# Patient Record
Sex: Female | Born: 1950 | Race: White | Hispanic: No | Marital: Married | Smoking: Never smoker
Health system: Southern US, Community
[De-identification: ages and names within clinical notes are randomized; demographics above are authoritative.]

## PROBLEM LIST (undated history)

## (undated) DIAGNOSIS — C50919 Malignant neoplasm of unspecified site of unspecified female breast: Secondary | ICD-10-CM

## (undated) DIAGNOSIS — E119 Type 2 diabetes mellitus without complications: Secondary | ICD-10-CM

## (undated) DIAGNOSIS — J45909 Unspecified asthma, uncomplicated: Secondary | ICD-10-CM

## (undated) DIAGNOSIS — R519 Headache, unspecified: Secondary | ICD-10-CM

## (undated) DIAGNOSIS — M8580 Other specified disorders of bone density and structure, unspecified site: Secondary | ICD-10-CM

## (undated) DIAGNOSIS — K219 Gastro-esophageal reflux disease without esophagitis: Secondary | ICD-10-CM

## (undated) DIAGNOSIS — D649 Anemia, unspecified: Secondary | ICD-10-CM

## (undated) DIAGNOSIS — I10 Essential (primary) hypertension: Secondary | ICD-10-CM

## (undated) DIAGNOSIS — Z8719 Personal history of other diseases of the digestive system: Secondary | ICD-10-CM

## (undated) DIAGNOSIS — M810 Age-related osteoporosis without current pathological fracture: Secondary | ICD-10-CM

## (undated) DIAGNOSIS — G43909 Migraine, unspecified, not intractable, without status migrainosus: Secondary | ICD-10-CM

## (undated) DIAGNOSIS — G4730 Sleep apnea, unspecified: Secondary | ICD-10-CM

## (undated) DIAGNOSIS — M1990 Unspecified osteoarthritis, unspecified site: Secondary | ICD-10-CM

## (undated) DIAGNOSIS — T7840XA Allergy, unspecified, initial encounter: Secondary | ICD-10-CM

## (undated) DIAGNOSIS — M797 Fibromyalgia: Secondary | ICD-10-CM

## (undated) DIAGNOSIS — R0600 Dyspnea, unspecified: Secondary | ICD-10-CM

## (undated) DIAGNOSIS — I499 Cardiac arrhythmia, unspecified: Secondary | ICD-10-CM

## (undated) DIAGNOSIS — L409 Psoriasis, unspecified: Secondary | ICD-10-CM

## (undated) DIAGNOSIS — Z5189 Encounter for other specified aftercare: Secondary | ICD-10-CM

## (undated) DIAGNOSIS — E785 Hyperlipidemia, unspecified: Secondary | ICD-10-CM

## (undated) DIAGNOSIS — Z8489 Family history of other specified conditions: Secondary | ICD-10-CM

## (undated) DIAGNOSIS — R0902 Hypoxemia: Secondary | ICD-10-CM

## (undated) HISTORY — PX: ABDOMINAL HYSTERECTOMY: SHX81

## (undated) HISTORY — DX: Encounter for other specified aftercare: Z51.89

## (undated) HISTORY — DX: Other specified disorders of bone density and structure, unspecified site: M85.80

## (undated) HISTORY — DX: Fibromyalgia: M79.7

## (undated) HISTORY — PX: TUBAL LIGATION: SHX77

## (undated) HISTORY — PX: OTHER SURGICAL HISTORY: SHX169

## (undated) HISTORY — DX: Hyperlipidemia, unspecified: E78.5

## (undated) HISTORY — DX: Allergy, unspecified, initial encounter: T78.40XA

## (undated) HISTORY — PX: CHOLECYSTECTOMY: SHX55

## (undated) HISTORY — PX: BREAST SURGERY: SHX581

## (undated) HISTORY — DX: Type 2 diabetes mellitus without complications: E11.9

## (undated) HISTORY — DX: Hypoxemia: R09.02

## (undated) HISTORY — PX: APPENDECTOMY: SHX54

## (undated) HISTORY — DX: Psoriasis, unspecified: L40.9

## (undated) HISTORY — PX: FRACTURE SURGERY: SHX138

## (undated) HISTORY — PX: HYSTERECTOMY ABDOMINAL WITH SALPINGO-OOPHORECTOMY: SHX6792

## (undated) HISTORY — DX: Sleep apnea, unspecified: G47.30

## (undated) HISTORY — PX: SHOULDER SURGERY: SHX246

## (undated) HISTORY — DX: Age-related osteoporosis without current pathological fracture: M81.0

## (undated) HISTORY — DX: Gastro-esophageal reflux disease without esophagitis: K21.9

## (undated) HISTORY — PX: LYSIS OF ADHESION: SHX5961

---

## 2000-03-20 ENCOUNTER — Other Ambulatory Visit: Admission: RE | Admit: 2000-03-20 | Discharge: 2000-03-20 | Payer: Self-pay | Admitting: Family Medicine

## 2000-08-26 ENCOUNTER — Encounter: Admission: RE | Admit: 2000-08-26 | Discharge: 2000-08-26 | Payer: Self-pay | Admitting: Rheumatology

## 2000-08-26 ENCOUNTER — Encounter: Payer: Self-pay | Admitting: Rheumatology

## 2001-09-03 ENCOUNTER — Ambulatory Visit (HOSPITAL_COMMUNITY): Admission: RE | Admit: 2001-09-03 | Discharge: 2001-09-03 | Payer: Self-pay | Admitting: Gastroenterology

## 2001-09-21 ENCOUNTER — Other Ambulatory Visit: Admission: RE | Admit: 2001-09-21 | Discharge: 2001-09-21 | Payer: Self-pay | Admitting: Obstetrics and Gynecology

## 2001-11-05 ENCOUNTER — Encounter: Admission: RE | Admit: 2001-11-05 | Discharge: 2001-11-05 | Payer: Self-pay | Admitting: Rheumatology

## 2001-11-05 ENCOUNTER — Encounter: Payer: Self-pay | Admitting: Rheumatology

## 2003-11-28 ENCOUNTER — Encounter: Admission: RE | Admit: 2003-11-28 | Discharge: 2003-11-28 | Payer: Self-pay | Admitting: Rheumatology

## 2005-08-14 ENCOUNTER — Encounter: Admission: RE | Admit: 2005-08-14 | Discharge: 2005-08-14 | Payer: Self-pay | Admitting: Gastroenterology

## 2007-05-12 ENCOUNTER — Encounter (INDEPENDENT_AMBULATORY_CARE_PROVIDER_SITE_OTHER): Payer: Self-pay | Admitting: Gastroenterology

## 2007-05-12 ENCOUNTER — Ambulatory Visit (HOSPITAL_COMMUNITY): Admission: RE | Admit: 2007-05-12 | Discharge: 2007-05-12 | Payer: Self-pay | Admitting: Gastroenterology

## 2008-03-25 ENCOUNTER — Encounter: Admission: RE | Admit: 2008-03-25 | Discharge: 2008-03-25 | Payer: Self-pay | Admitting: Rheumatology

## 2008-03-27 ENCOUNTER — Encounter: Admission: RE | Admit: 2008-03-27 | Discharge: 2008-03-27 | Payer: Self-pay | Admitting: Rheumatology

## 2008-07-18 ENCOUNTER — Encounter: Admission: RE | Admit: 2008-07-18 | Discharge: 2008-07-18 | Payer: Self-pay | Admitting: Rheumatology

## 2010-01-29 ENCOUNTER — Encounter: Admission: RE | Admit: 2010-01-29 | Discharge: 2010-01-29 | Payer: Self-pay | Admitting: Family Medicine

## 2010-12-07 ENCOUNTER — Encounter: Payer: Self-pay | Admitting: Rheumatology

## 2011-03-21 ENCOUNTER — Other Ambulatory Visit: Payer: Self-pay | Admitting: Unknown Physician Specialty

## 2011-03-21 ENCOUNTER — Ambulatory Visit
Admission: RE | Admit: 2011-03-21 | Discharge: 2011-03-21 | Disposition: A | Discharge status: Home / Self Care | Payer: PRIVATE HEALTH INSURANCE | Source: Ambulatory Visit | Attending: Unknown Physician Specialty | Admitting: Unknown Physician Specialty

## 2011-03-21 DIAGNOSIS — R52 Pain, unspecified: Secondary | ICD-10-CM

## 2011-04-01 NOTE — Op Note (Signed)
Kayla Bennett, Kayla Bennett             ACCOUNT NO.:  [id number]   MEDICAL RECORD NO.:  [id number]          PATIENT TYPE:  AMB   LOCATION:  ENDO                         FACILITY:  [facility]   PHYSICIAN:  Anselmo Rod, M.D.  DATE OF BIRTH:  13-Sep-1951   DATE OF PROCEDURE:  05/12/2007  DATE OF DISCHARGE:                               OPERATIVE REPORT   PROCEDURE PERFORMED:  Colonoscopy with snare polypectomy x 2 and cold  biopsies x1.   ENDOSCOPIST:  Anselmo Rod, M.D.   INSTRUMENT USED:  Pentax video colonoscope.   INDICATIONS FOR PROCEDURE:  A 56-year white female with a family history  of colon cancer in her father and a paternal cousin undergoing screening  colonoscopy to rule out colonic polyps, masses, etc.   PREPROCEDURE PREPARATION:  Informed consent was procured from the  patient. The patient fasted for 8 hours prior to the procedure after  being prepped with 32 Osmoprep pills the night of the procedure. Risks  and benefits of the procedure including a 10% miss rate of cancer and  polyp were discussed with the patient as well.   PREPROCEDURE PHYSICAL:  VITAL SIGNS:  The patient had stable vital  signs.  NECK:  Supple.  CHEST:  Clear to auscultation.  CARDIAC:  S1 and S2 regular.  ABDOMEN:  Soft with normal bowel sounds.   DESCRIPTION OF PROCEDURE:  The patient was placed in left lateral  decubitus position and sedated with a 125 mg of Fentanyl and 12 mg of  Versed given intravenously in slow incremental doses.  Once the patient  was adequately sedated and maintained on low-flow oxygen and continuous  cardiac monitoring, the Pentax video colonoscope was advanced from the  rectum to the cecum with difficulty.  The patient has a history of  fibromyalgia and therefore seemed to experience a lot of abdominal  discomfort with insufflation of air into the colon. Multiple washes were  done.  The patient had a poor prep.  There was a significant amount of  residual stool in the  colon. A small polyp was ablated with the tip of  the hot snare at 100 cm, another flat polyp was removed by hot snare  from 110 cm. A small sessile polyp was biopsied from 100 cm as well.  Retroflexion in the rectum revealed no abnormalities except for small  internal hemorrhoids.  The patient tolerated the procedure well without  complications. Scope had to be withdrawn on several occasions and  reinserted to complete the procedure. The patient has significant  abdominal discomfort.  As the scope was withdrawn, a significant amount  of air was suctioned out. The patient tolerated the procedure well  without immediate complications.   IMPRESSION:  1. A flat polyp removed by hot snare at 110 cm, one small polyp      ablated at 100 cm with the tip of the hot snare.  2. Small polyp biopsied from 100 cm.   RECOMMENDATIONS:  1. Await pathology results.  2. Avoid all nonsteroidals including aspirin for the next four weeks.  3. Outpatient follow-up in the next two weeks.  Further recommendation,      the patient should be prepped with a gallon of NuLYTELY the next      time and be done under propofol for better results.  This will be      discussed with her on a follow-up visit.      Anselmo Rod, M.D.  Electronically Signed     JNM/MEDQ  D:  05/12/2007  T:  05/12/2007  Job:  [id number]   cc:   Talmadge Coventry, M.D.  Fax: [phone]

## 2011-04-03 ENCOUNTER — Ambulatory Visit: Payer: PRIVATE HEALTH INSURANCE | Attending: Orthopedic Surgery | Admitting: Physical Therapy

## 2011-04-03 DIAGNOSIS — M25519 Pain in unspecified shoulder: Secondary | ICD-10-CM | POA: Insufficient documentation

## 2011-04-03 DIAGNOSIS — M25619 Stiffness of unspecified shoulder, not elsewhere classified: Secondary | ICD-10-CM | POA: Insufficient documentation

## 2011-04-03 DIAGNOSIS — IMO0001 Reserved for inherently not codable concepts without codable children: Secondary | ICD-10-CM | POA: Insufficient documentation

## 2011-04-04 NOTE — Procedures (Signed)
[place]  Patient:    Kayla Bennett, Kayla B. Visit Number: [id number] MRN: [id number]          Service Type: Attending:  Anselmo Rod, M.D. Dictated by:   Anselmo Rod, M.D. Proc. Date: 09/03/01   CC:         Talmadge Coventry, M.D.                           Procedure Report  DATE OF BIRTH:  October 17, 1951  REFERRING PHYSICIAN:  Talmadge Coventry, M.D.  PROCEDURE PERFORMED:  Colonoscopy.  ENDOSCOPIST:  Anselmo Rod, M.D.  INSTRUMENT USED:  Olympus video colonoscope (pediatric).  INDICATIONS FOR PROCEDURE:  Family history of colon cancer and history of change in bowel habits with some left lower quadrant pain in a 60 year old white female.  Rule out colonic polyps, masses, hemorrhoids, etc.  PREPROCEDURE PREPARATION:  Informed consent was procured from the patient. The patient was fasted for eight hours prior to the procedure.  PREPROCEDURE PHYSICAL:  The patient had stable vital signs.  Neck supple. Chest clear to auscultation.  S1, S2 regular.  Abdomen soft with normal abdominal bowel sounds.  DESCRIPTION OF PROCEDURE:  The patient was placed in the left lateral decubitus position and sedated with 50 mg of Demerol and 3 mg of Versed intravenously.  Once the patient was adequately sedated and maintained on low-flow oxygen and continuous cardiac monitoring, the Olympus video colonoscope was advanced from the rectum to the cecum without difficulty. Except for small internal hemorrhoids, no abnormality was seen, no masses polyps, erosions, ulcerations or diverticula were present.  IMPRESSION:  Normal colonoscopy except for small internal hemorrhoids.  RECOMMENDATIONS: 1. Repeat colorectal cancer screening has been recommended in the next    five years unless the patient were to develop any abnormal symptoms in    the interim. 2. A high fiber diet has been advised.Dictated by:   Anselmo Rod, M.D.  Attending:  Anselmo Rod, M.D. DD:  09/03/01 TD:  09/06/01 Job: [id number]

## 2011-04-07 ENCOUNTER — Ambulatory Visit: Payer: PRIVATE HEALTH INSURANCE | Admitting: Physical Therapy

## 2011-04-09 ENCOUNTER — Other Ambulatory Visit: Payer: Self-pay | Admitting: Dermatology

## 2011-04-10 ENCOUNTER — Ambulatory Visit: Payer: PRIVATE HEALTH INSURANCE | Admitting: Physical Therapy

## 2011-04-15 ENCOUNTER — Ambulatory Visit: Payer: PRIVATE HEALTH INSURANCE | Admitting: Physical Therapy

## 2011-04-17 ENCOUNTER — Ambulatory Visit: Payer: PRIVATE HEALTH INSURANCE | Admitting: Physical Therapy

## 2011-04-18 ENCOUNTER — Encounter: Payer: PRIVATE HEALTH INSURANCE | Admitting: Physical Therapy

## 2011-11-06 ENCOUNTER — Other Ambulatory Visit: Payer: Self-pay | Admitting: Unknown Physician Specialty

## 2011-11-06 DIAGNOSIS — Z780 Asymptomatic menopausal state: Secondary | ICD-10-CM

## 2012-10-13 ENCOUNTER — Other Ambulatory Visit: Payer: Self-pay | Admitting: Unknown Physician Specialty

## 2012-10-13 DIAGNOSIS — Z780 Asymptomatic menopausal state: Secondary | ICD-10-CM

## 2012-10-18 ENCOUNTER — Ambulatory Visit
Admission: RE | Admit: 2012-10-18 | Discharge: 2012-10-18 | Disposition: A | Discharge status: Home / Self Care | Payer: PRIVATE HEALTH INSURANCE | Source: Ambulatory Visit | Attending: Unknown Physician Specialty | Admitting: Unknown Physician Specialty

## 2012-10-18 DIAGNOSIS — Z780 Asymptomatic menopausal state: Secondary | ICD-10-CM

## 2012-10-19 ENCOUNTER — Other Ambulatory Visit: Payer: PRIVATE HEALTH INSURANCE

## 2013-01-07 ENCOUNTER — Other Ambulatory Visit: Payer: Self-pay | Admitting: Dermatology

## 2016-04-08 DIAGNOSIS — E119 Type 2 diabetes mellitus without complications: Secondary | ICD-10-CM | POA: Diagnosis not present

## 2016-04-08 DIAGNOSIS — I10 Essential (primary) hypertension: Secondary | ICD-10-CM | POA: Diagnosis not present

## 2016-04-08 DIAGNOSIS — M797 Fibromyalgia: Secondary | ICD-10-CM | POA: Diagnosis not present

## 2016-04-08 DIAGNOSIS — E7800 Pure hypercholesterolemia, unspecified: Secondary | ICD-10-CM | POA: Diagnosis not present

## 2016-05-02 DIAGNOSIS — H2513 Age-related nuclear cataract, bilateral: Secondary | ICD-10-CM | POA: Diagnosis not present

## 2016-05-02 DIAGNOSIS — E1139 Type 2 diabetes mellitus with other diabetic ophthalmic complication: Secondary | ICD-10-CM | POA: Diagnosis not present

## 2016-05-02 DIAGNOSIS — H43393 Other vitreous opacities, bilateral: Secondary | ICD-10-CM | POA: Diagnosis not present

## 2016-08-04 DIAGNOSIS — E119 Type 2 diabetes mellitus without complications: Secondary | ICD-10-CM | POA: Diagnosis not present

## 2016-08-04 DIAGNOSIS — M8580 Other specified disorders of bone density and structure, unspecified site: Secondary | ICD-10-CM | POA: Diagnosis not present

## 2016-08-04 DIAGNOSIS — M899 Disorder of bone, unspecified: Secondary | ICD-10-CM | POA: Diagnosis not present

## 2016-08-04 DIAGNOSIS — Z0000 Encounter for general adult medical examination without abnormal findings: Secondary | ICD-10-CM | POA: Diagnosis not present

## 2016-08-04 DIAGNOSIS — R61 Generalized hyperhidrosis: Secondary | ICD-10-CM | POA: Diagnosis not present

## 2016-08-04 DIAGNOSIS — R5383 Other fatigue: Secondary | ICD-10-CM | POA: Diagnosis not present

## 2016-08-04 DIAGNOSIS — E7800 Pure hypercholesterolemia, unspecified: Secondary | ICD-10-CM | POA: Diagnosis not present

## 2016-08-04 DIAGNOSIS — Z1231 Encounter for screening mammogram for malignant neoplasm of breast: Secondary | ICD-10-CM | POA: Diagnosis not present

## 2016-08-04 DIAGNOSIS — I10 Essential (primary) hypertension: Secondary | ICD-10-CM | POA: Diagnosis not present

## 2016-08-04 DIAGNOSIS — Z23 Encounter for immunization: Secondary | ICD-10-CM | POA: Diagnosis not present

## 2016-08-07 DIAGNOSIS — L814 Other melanin hyperpigmentation: Secondary | ICD-10-CM | POA: Diagnosis not present

## 2016-08-07 DIAGNOSIS — L570 Actinic keratosis: Secondary | ICD-10-CM | POA: Diagnosis not present

## 2016-08-07 DIAGNOSIS — D485 Neoplasm of uncertain behavior of skin: Secondary | ICD-10-CM | POA: Diagnosis not present

## 2016-08-07 DIAGNOSIS — L821 Other seborrheic keratosis: Secondary | ICD-10-CM | POA: Diagnosis not present

## 2016-08-07 DIAGNOSIS — D225 Melanocytic nevi of trunk: Secondary | ICD-10-CM | POA: Diagnosis not present

## 2016-08-07 DIAGNOSIS — L820 Inflamed seborrheic keratosis: Secondary | ICD-10-CM | POA: Diagnosis not present

## 2016-08-18 DIAGNOSIS — Z23 Encounter for immunization: Secondary | ICD-10-CM | POA: Diagnosis not present

## 2016-10-27 DIAGNOSIS — Z1231 Encounter for screening mammogram for malignant neoplasm of breast: Secondary | ICD-10-CM | POA: Diagnosis not present

## 2016-12-17 DIAGNOSIS — M797 Fibromyalgia: Secondary | ICD-10-CM | POA: Diagnosis not present

## 2016-12-17 DIAGNOSIS — E119 Type 2 diabetes mellitus without complications: Secondary | ICD-10-CM | POA: Diagnosis not present

## 2016-12-17 DIAGNOSIS — E7800 Pure hypercholesterolemia, unspecified: Secondary | ICD-10-CM | POA: Diagnosis not present

## 2016-12-17 DIAGNOSIS — I10 Essential (primary) hypertension: Secondary | ICD-10-CM | POA: Diagnosis not present

## 2016-12-19 ENCOUNTER — Other Ambulatory Visit: Payer: Self-pay | Admitting: Unknown Physician Specialty

## 2016-12-19 DIAGNOSIS — R5381 Other malaise: Secondary | ICD-10-CM

## 2016-12-24 ENCOUNTER — Other Ambulatory Visit: Payer: Self-pay | Admitting: Unknown Physician Specialty

## 2016-12-24 DIAGNOSIS — E2839 Other primary ovarian failure: Secondary | ICD-10-CM

## 2017-04-20 DIAGNOSIS — E119 Type 2 diabetes mellitus without complications: Secondary | ICD-10-CM | POA: Diagnosis not present

## 2017-04-20 DIAGNOSIS — G43909 Migraine, unspecified, not intractable, without status migrainosus: Secondary | ICD-10-CM | POA: Diagnosis not present

## 2017-04-20 DIAGNOSIS — I10 Essential (primary) hypertension: Secondary | ICD-10-CM | POA: Diagnosis not present

## 2017-04-20 DIAGNOSIS — E7800 Pure hypercholesterolemia, unspecified: Secondary | ICD-10-CM | POA: Diagnosis not present

## 2017-05-06 DIAGNOSIS — E119 Type 2 diabetes mellitus without complications: Secondary | ICD-10-CM | POA: Diagnosis not present

## 2017-05-06 DIAGNOSIS — Z7984 Long term (current) use of oral hypoglycemic drugs: Secondary | ICD-10-CM | POA: Diagnosis not present

## 2017-05-06 DIAGNOSIS — H527 Unspecified disorder of refraction: Secondary | ICD-10-CM | POA: Diagnosis not present

## 2017-08-05 DIAGNOSIS — I10 Essential (primary) hypertension: Secondary | ICD-10-CM | POA: Diagnosis not present

## 2017-08-05 DIAGNOSIS — R5383 Other fatigue: Secondary | ICD-10-CM | POA: Diagnosis not present

## 2017-08-05 DIAGNOSIS — M791 Myalgia: Secondary | ICD-10-CM | POA: Diagnosis not present

## 2017-08-05 DIAGNOSIS — M2550 Pain in unspecified joint: Secondary | ICD-10-CM | POA: Diagnosis not present

## 2017-08-05 DIAGNOSIS — M8580 Other specified disorders of bone density and structure, unspecified site: Secondary | ICD-10-CM | POA: Diagnosis not present

## 2017-08-05 DIAGNOSIS — Z1231 Encounter for screening mammogram for malignant neoplasm of breast: Secondary | ICD-10-CM | POA: Diagnosis not present

## 2017-08-05 DIAGNOSIS — E7800 Pure hypercholesterolemia, unspecified: Secondary | ICD-10-CM | POA: Diagnosis not present

## 2017-08-05 DIAGNOSIS — G43909 Migraine, unspecified, not intractable, without status migrainosus: Secondary | ICD-10-CM | POA: Diagnosis not present

## 2017-08-05 DIAGNOSIS — E119 Type 2 diabetes mellitus without complications: Secondary | ICD-10-CM | POA: Diagnosis not present

## 2017-08-05 DIAGNOSIS — M899 Disorder of bone, unspecified: Secondary | ICD-10-CM | POA: Diagnosis not present

## 2017-08-05 DIAGNOSIS — Z0000 Encounter for general adult medical examination without abnormal findings: Secondary | ICD-10-CM | POA: Diagnosis not present

## 2017-08-14 ENCOUNTER — Other Ambulatory Visit: Payer: Self-pay | Admitting: Unknown Physician Specialty

## 2017-08-14 DIAGNOSIS — E2839 Other primary ovarian failure: Secondary | ICD-10-CM

## 2017-08-31 DIAGNOSIS — Z23 Encounter for immunization: Secondary | ICD-10-CM | POA: Diagnosis not present

## 2017-10-07 DIAGNOSIS — L700 Acne vulgaris: Secondary | ICD-10-CM | POA: Diagnosis not present

## 2017-10-07 DIAGNOSIS — D225 Melanocytic nevi of trunk: Secondary | ICD-10-CM | POA: Diagnosis not present

## 2017-10-07 DIAGNOSIS — D2262 Melanocytic nevi of left upper limb, including shoulder: Secondary | ICD-10-CM | POA: Diagnosis not present

## 2017-10-07 DIAGNOSIS — L853 Xerosis cutis: Secondary | ICD-10-CM | POA: Diagnosis not present

## 2017-10-07 DIAGNOSIS — D1801 Hemangioma of skin and subcutaneous tissue: Secondary | ICD-10-CM | POA: Diagnosis not present

## 2017-10-07 DIAGNOSIS — L814 Other melanin hyperpigmentation: Secondary | ICD-10-CM | POA: Diagnosis not present

## 2017-10-07 DIAGNOSIS — L300 Nummular dermatitis: Secondary | ICD-10-CM | POA: Diagnosis not present

## 2017-10-07 DIAGNOSIS — L821 Other seborrheic keratosis: Secondary | ICD-10-CM | POA: Diagnosis not present

## 2017-10-29 ENCOUNTER — Ambulatory Visit
Admission: RE | Admit: 2017-10-29 | Discharge: 2017-10-29 | Disposition: A | Discharge status: Home / Self Care | Payer: Medicare Other | Source: Ambulatory Visit | Attending: Unknown Physician Specialty | Admitting: Unknown Physician Specialty

## 2017-10-29 DIAGNOSIS — M8589 Other specified disorders of bone density and structure, multiple sites: Secondary | ICD-10-CM | POA: Diagnosis not present

## 2017-10-29 DIAGNOSIS — Z780 Asymptomatic menopausal state: Secondary | ICD-10-CM | POA: Diagnosis not present

## 2017-10-29 DIAGNOSIS — E2839 Other primary ovarian failure: Secondary | ICD-10-CM

## 2017-11-23 DIAGNOSIS — E7800 Pure hypercholesterolemia, unspecified: Secondary | ICD-10-CM | POA: Diagnosis not present

## 2017-11-23 DIAGNOSIS — E119 Type 2 diabetes mellitus without complications: Secondary | ICD-10-CM | POA: Diagnosis not present

## 2017-11-23 DIAGNOSIS — I10 Essential (primary) hypertension: Secondary | ICD-10-CM | POA: Diagnosis not present

## 2017-12-04 DIAGNOSIS — E119 Type 2 diabetes mellitus without complications: Secondary | ICD-10-CM | POA: Diagnosis not present

## 2017-12-04 DIAGNOSIS — I10 Essential (primary) hypertension: Secondary | ICD-10-CM | POA: Diagnosis not present

## 2017-12-04 DIAGNOSIS — E559 Vitamin D deficiency, unspecified: Secondary | ICD-10-CM | POA: Diagnosis not present

## 2017-12-04 DIAGNOSIS — E7800 Pure hypercholesterolemia, unspecified: Secondary | ICD-10-CM | POA: Diagnosis not present

## 2018-04-06 DIAGNOSIS — E119 Type 2 diabetes mellitus without complications: Secondary | ICD-10-CM | POA: Diagnosis not present

## 2018-04-06 DIAGNOSIS — K219 Gastro-esophageal reflux disease without esophagitis: Secondary | ICD-10-CM | POA: Diagnosis not present

## 2018-04-06 DIAGNOSIS — I10 Essential (primary) hypertension: Secondary | ICD-10-CM | POA: Diagnosis not present

## 2018-04-06 DIAGNOSIS — E7800 Pure hypercholesterolemia, unspecified: Secondary | ICD-10-CM | POA: Diagnosis not present

## 2018-04-30 DIAGNOSIS — E785 Hyperlipidemia, unspecified: Secondary | ICD-10-CM | POA: Diagnosis not present

## 2018-05-26 DIAGNOSIS — E119 Type 2 diabetes mellitus without complications: Secondary | ICD-10-CM | POA: Diagnosis not present

## 2018-05-26 DIAGNOSIS — H527 Unspecified disorder of refraction: Secondary | ICD-10-CM | POA: Diagnosis not present

## 2018-05-26 DIAGNOSIS — Z7984 Long term (current) use of oral hypoglycemic drugs: Secondary | ICD-10-CM | POA: Diagnosis not present

## 2018-08-06 DIAGNOSIS — M899 Disorder of bone, unspecified: Secondary | ICD-10-CM | POA: Diagnosis not present

## 2018-08-06 DIAGNOSIS — E7800 Pure hypercholesterolemia, unspecified: Secondary | ICD-10-CM | POA: Diagnosis not present

## 2018-08-06 DIAGNOSIS — E559 Vitamin D deficiency, unspecified: Secondary | ICD-10-CM | POA: Diagnosis not present

## 2018-08-06 DIAGNOSIS — I10 Essential (primary) hypertension: Secondary | ICD-10-CM | POA: Diagnosis not present

## 2018-08-06 DIAGNOSIS — Z1289 Encounter for screening for malignant neoplasm of other sites: Secondary | ICD-10-CM | POA: Diagnosis not present

## 2018-08-06 DIAGNOSIS — R5383 Other fatigue: Secondary | ICD-10-CM | POA: Diagnosis not present

## 2018-08-06 DIAGNOSIS — E1169 Type 2 diabetes mellitus with other specified complication: Secondary | ICD-10-CM | POA: Diagnosis not present

## 2018-08-06 DIAGNOSIS — M8580 Other specified disorders of bone density and structure, unspecified site: Secondary | ICD-10-CM | POA: Diagnosis not present

## 2018-09-10 DIAGNOSIS — Z23 Encounter for immunization: Secondary | ICD-10-CM | POA: Diagnosis not present

## 2018-09-23 DIAGNOSIS — Z1231 Encounter for screening mammogram for malignant neoplasm of breast: Secondary | ICD-10-CM | POA: Diagnosis not present

## 2018-11-18 DIAGNOSIS — L918 Other hypertrophic disorders of the skin: Secondary | ICD-10-CM | POA: Diagnosis not present

## 2018-11-18 DIAGNOSIS — L820 Inflamed seborrheic keratosis: Secondary | ICD-10-CM | POA: Diagnosis not present

## 2018-11-18 DIAGNOSIS — L570 Actinic keratosis: Secondary | ICD-10-CM | POA: Diagnosis not present

## 2018-11-18 DIAGNOSIS — L814 Other melanin hyperpigmentation: Secondary | ICD-10-CM | POA: Diagnosis not present

## 2018-11-18 DIAGNOSIS — L723 Sebaceous cyst: Secondary | ICD-10-CM | POA: Diagnosis not present

## 2018-11-18 DIAGNOSIS — D1801 Hemangioma of skin and subcutaneous tissue: Secondary | ICD-10-CM | POA: Diagnosis not present

## 2018-11-18 DIAGNOSIS — L308 Other specified dermatitis: Secondary | ICD-10-CM | POA: Diagnosis not present

## 2018-11-18 DIAGNOSIS — L821 Other seborrheic keratosis: Secondary | ICD-10-CM | POA: Diagnosis not present

## 2018-11-18 DIAGNOSIS — D225 Melanocytic nevi of trunk: Secondary | ICD-10-CM | POA: Diagnosis not present

## 2018-12-07 DIAGNOSIS — M797 Fibromyalgia: Secondary | ICD-10-CM | POA: Diagnosis not present

## 2018-12-07 DIAGNOSIS — E7800 Pure hypercholesterolemia, unspecified: Secondary | ICD-10-CM | POA: Diagnosis not present

## 2018-12-07 DIAGNOSIS — E1169 Type 2 diabetes mellitus with other specified complication: Secondary | ICD-10-CM | POA: Diagnosis not present

## 2018-12-07 DIAGNOSIS — I10 Essential (primary) hypertension: Secondary | ICD-10-CM | POA: Diagnosis not present

## 2019-02-01 DIAGNOSIS — Z1211 Encounter for screening for malignant neoplasm of colon: Secondary | ICD-10-CM | POA: Diagnosis not present

## 2019-02-01 DIAGNOSIS — K219 Gastro-esophageal reflux disease without esophagitis: Secondary | ICD-10-CM | POA: Diagnosis not present

## 2019-02-01 DIAGNOSIS — Z86010 Personal history of colonic polyps: Secondary | ICD-10-CM | POA: Diagnosis not present

## 2019-02-01 DIAGNOSIS — Z800 Family history of malignant neoplasm of digestive organs: Secondary | ICD-10-CM | POA: Diagnosis not present

## 2019-03-18 DIAGNOSIS — I10 Essential (primary) hypertension: Secondary | ICD-10-CM | POA: Diagnosis not present

## 2019-03-18 DIAGNOSIS — E7800 Pure hypercholesterolemia, unspecified: Secondary | ICD-10-CM | POA: Diagnosis not present

## 2019-03-18 DIAGNOSIS — E1169 Type 2 diabetes mellitus with other specified complication: Secondary | ICD-10-CM | POA: Diagnosis not present

## 2019-03-18 DIAGNOSIS — M797 Fibromyalgia: Secondary | ICD-10-CM | POA: Diagnosis not present

## 2019-04-20 DIAGNOSIS — K635 Polyp of colon: Secondary | ICD-10-CM | POA: Diagnosis not present

## 2019-04-20 DIAGNOSIS — Z1211 Encounter for screening for malignant neoplasm of colon: Secondary | ICD-10-CM | POA: Diagnosis not present

## 2019-04-20 DIAGNOSIS — Z800 Family history of malignant neoplasm of digestive organs: Secondary | ICD-10-CM | POA: Diagnosis not present

## 2019-04-20 DIAGNOSIS — D125 Benign neoplasm of sigmoid colon: Secondary | ICD-10-CM | POA: Diagnosis not present

## 2019-05-12 DIAGNOSIS — S79911A Unspecified injury of right hip, initial encounter: Secondary | ICD-10-CM | POA: Diagnosis not present

## 2019-05-12 DIAGNOSIS — I10 Essential (primary) hypertension: Secondary | ICD-10-CM | POA: Diagnosis not present

## 2019-05-12 DIAGNOSIS — M25561 Pain in right knee: Secondary | ICD-10-CM | POA: Diagnosis not present

## 2019-05-12 DIAGNOSIS — Z885 Allergy status to narcotic agent status: Secondary | ICD-10-CM | POA: Diagnosis not present

## 2019-05-12 DIAGNOSIS — Z888 Allergy status to other drugs, medicaments and biological substances status: Secondary | ICD-10-CM | POA: Diagnosis not present

## 2019-05-12 DIAGNOSIS — M25551 Pain in right hip: Secondary | ICD-10-CM | POA: Diagnosis not present

## 2019-05-12 DIAGNOSIS — Z881 Allergy status to other antibiotic agents status: Secondary | ICD-10-CM | POA: Diagnosis not present

## 2019-05-12 DIAGNOSIS — M1711 Unilateral primary osteoarthritis, right knee: Secondary | ICD-10-CM | POA: Diagnosis not present

## 2019-05-12 DIAGNOSIS — K219 Gastro-esophageal reflux disease without esophagitis: Secondary | ICD-10-CM | POA: Diagnosis not present

## 2019-05-12 DIAGNOSIS — M797 Fibromyalgia: Secondary | ICD-10-CM | POA: Diagnosis not present

## 2019-05-12 DIAGNOSIS — E119 Type 2 diabetes mellitus without complications: Secondary | ICD-10-CM | POA: Diagnosis not present

## 2019-05-12 DIAGNOSIS — Z7982 Long term (current) use of aspirin: Secondary | ICD-10-CM | POA: Diagnosis not present

## 2019-05-12 DIAGNOSIS — Z79899 Other long term (current) drug therapy: Secondary | ICD-10-CM | POA: Diagnosis not present

## 2019-05-12 DIAGNOSIS — R1031 Right lower quadrant pain: Secondary | ICD-10-CM | POA: Diagnosis not present

## 2019-05-12 DIAGNOSIS — G8911 Acute pain due to trauma: Secondary | ICD-10-CM | POA: Diagnosis not present

## 2019-06-20 ENCOUNTER — Other Ambulatory Visit: Payer: Self-pay

## 2019-08-02 DIAGNOSIS — E559 Vitamin D deficiency, unspecified: Secondary | ICD-10-CM | POA: Diagnosis not present

## 2019-08-02 DIAGNOSIS — M8580 Other specified disorders of bone density and structure, unspecified site: Secondary | ICD-10-CM | POA: Diagnosis not present

## 2019-08-02 DIAGNOSIS — Z23 Encounter for immunization: Secondary | ICD-10-CM | POA: Diagnosis not present

## 2019-08-02 DIAGNOSIS — E1169 Type 2 diabetes mellitus with other specified complication: Secondary | ICD-10-CM | POA: Diagnosis not present

## 2019-08-02 DIAGNOSIS — Z0000 Encounter for general adult medical examination without abnormal findings: Secondary | ICD-10-CM | POA: Diagnosis not present

## 2019-08-02 DIAGNOSIS — M797 Fibromyalgia: Secondary | ICD-10-CM | POA: Diagnosis not present

## 2019-08-02 DIAGNOSIS — R5383 Other fatigue: Secondary | ICD-10-CM | POA: Diagnosis not present

## 2019-08-02 DIAGNOSIS — M899 Disorder of bone, unspecified: Secondary | ICD-10-CM | POA: Diagnosis not present

## 2019-08-02 DIAGNOSIS — E7800 Pure hypercholesterolemia, unspecified: Secondary | ICD-10-CM | POA: Diagnosis not present

## 2019-08-02 DIAGNOSIS — I10 Essential (primary) hypertension: Secondary | ICD-10-CM | POA: Diagnosis not present

## 2019-08-02 DIAGNOSIS — Z1231 Encounter for screening mammogram for malignant neoplasm of breast: Secondary | ICD-10-CM | POA: Diagnosis not present

## 2019-09-09 DIAGNOSIS — E785 Hyperlipidemia, unspecified: Secondary | ICD-10-CM | POA: Diagnosis not present

## 2019-10-19 DIAGNOSIS — Z1231 Encounter for screening mammogram for malignant neoplasm of breast: Secondary | ICD-10-CM | POA: Diagnosis not present

## 2019-10-25 DIAGNOSIS — R922 Inconclusive mammogram: Secondary | ICD-10-CM | POA: Diagnosis not present

## 2019-12-07 DIAGNOSIS — M797 Fibromyalgia: Secondary | ICD-10-CM | POA: Diagnosis not present

## 2019-12-07 DIAGNOSIS — E1169 Type 2 diabetes mellitus with other specified complication: Secondary | ICD-10-CM | POA: Diagnosis not present

## 2019-12-07 DIAGNOSIS — K219 Gastro-esophageal reflux disease without esophagitis: Secondary | ICD-10-CM | POA: Diagnosis not present

## 2019-12-07 DIAGNOSIS — I10 Essential (primary) hypertension: Secondary | ICD-10-CM | POA: Diagnosis not present

## 2019-12-30 DIAGNOSIS — E1169 Type 2 diabetes mellitus with other specified complication: Secondary | ICD-10-CM | POA: Diagnosis not present

## 2019-12-30 DIAGNOSIS — I10 Essential (primary) hypertension: Secondary | ICD-10-CM | POA: Diagnosis not present

## 2019-12-30 DIAGNOSIS — E7800 Pure hypercholesterolemia, unspecified: Secondary | ICD-10-CM | POA: Diagnosis not present

## 2020-01-25 DIAGNOSIS — L309 Dermatitis, unspecified: Secondary | ICD-10-CM | POA: Diagnosis not present

## 2020-01-25 DIAGNOSIS — L814 Other melanin hyperpigmentation: Secondary | ICD-10-CM | POA: Diagnosis not present

## 2020-01-25 DIAGNOSIS — L400 Psoriasis vulgaris: Secondary | ICD-10-CM | POA: Diagnosis not present

## 2020-01-25 DIAGNOSIS — L308 Other specified dermatitis: Secondary | ICD-10-CM | POA: Diagnosis not present

## 2020-01-25 DIAGNOSIS — D485 Neoplasm of uncertain behavior of skin: Secondary | ICD-10-CM | POA: Diagnosis not present

## 2020-01-25 DIAGNOSIS — L821 Other seborrheic keratosis: Secondary | ICD-10-CM | POA: Diagnosis not present

## 2020-02-15 DIAGNOSIS — Z7984 Long term (current) use of oral hypoglycemic drugs: Secondary | ICD-10-CM | POA: Diagnosis not present

## 2020-02-15 DIAGNOSIS — E119 Type 2 diabetes mellitus without complications: Secondary | ICD-10-CM | POA: Diagnosis not present

## 2020-02-15 DIAGNOSIS — H527 Unspecified disorder of refraction: Secondary | ICD-10-CM | POA: Diagnosis not present

## 2020-02-15 DIAGNOSIS — H524 Presbyopia: Secondary | ICD-10-CM | POA: Diagnosis not present

## 2020-02-29 DIAGNOSIS — M25571 Pain in right ankle and joints of right foot: Secondary | ICD-10-CM | POA: Diagnosis not present

## 2020-03-07 DIAGNOSIS — M25571 Pain in right ankle and joints of right foot: Secondary | ICD-10-CM | POA: Diagnosis not present

## 2020-03-07 DIAGNOSIS — M79671 Pain in right foot: Secondary | ICD-10-CM | POA: Diagnosis not present

## 2020-03-12 DIAGNOSIS — M19071 Primary osteoarthritis, right ankle and foot: Secondary | ICD-10-CM | POA: Diagnosis not present

## 2020-03-12 DIAGNOSIS — R600 Localized edema: Secondary | ICD-10-CM | POA: Diagnosis not present

## 2020-03-12 DIAGNOSIS — M79671 Pain in right foot: Secondary | ICD-10-CM | POA: Diagnosis not present

## 2020-03-20 DIAGNOSIS — M79671 Pain in right foot: Secondary | ICD-10-CM | POA: Diagnosis not present

## 2020-04-04 DIAGNOSIS — M79671 Pain in right foot: Secondary | ICD-10-CM | POA: Diagnosis not present

## 2020-04-12 DIAGNOSIS — E1169 Type 2 diabetes mellitus with other specified complication: Secondary | ICD-10-CM | POA: Diagnosis not present

## 2020-04-12 DIAGNOSIS — M797 Fibromyalgia: Secondary | ICD-10-CM | POA: Diagnosis not present

## 2020-04-12 DIAGNOSIS — I10 Essential (primary) hypertension: Secondary | ICD-10-CM | POA: Diagnosis not present

## 2020-04-12 DIAGNOSIS — K219 Gastro-esophageal reflux disease without esophagitis: Secondary | ICD-10-CM | POA: Diagnosis not present

## 2020-04-13 DIAGNOSIS — M79671 Pain in right foot: Secondary | ICD-10-CM | POA: Diagnosis not present

## 2020-04-13 DIAGNOSIS — R6889 Other general symptoms and signs: Secondary | ICD-10-CM | POA: Diagnosis not present

## 2020-05-16 DIAGNOSIS — M79671 Pain in right foot: Secondary | ICD-10-CM | POA: Diagnosis not present

## 2020-08-14 DIAGNOSIS — G43909 Migraine, unspecified, not intractable, without status migrainosus: Secondary | ICD-10-CM | POA: Diagnosis not present

## 2020-08-14 DIAGNOSIS — I10 Essential (primary) hypertension: Secondary | ICD-10-CM | POA: Diagnosis not present

## 2020-08-14 DIAGNOSIS — Z0000 Encounter for general adult medical examination without abnormal findings: Secondary | ICD-10-CM | POA: Diagnosis not present

## 2020-08-14 DIAGNOSIS — K219 Gastro-esophageal reflux disease without esophagitis: Secondary | ICD-10-CM | POA: Diagnosis not present

## 2020-08-14 DIAGNOSIS — Z01419 Encounter for gynecological examination (general) (routine) without abnormal findings: Secondary | ICD-10-CM | POA: Diagnosis not present

## 2020-08-14 DIAGNOSIS — E1169 Type 2 diabetes mellitus with other specified complication: Secondary | ICD-10-CM | POA: Diagnosis not present

## 2020-09-03 DIAGNOSIS — R5383 Other fatigue: Secondary | ICD-10-CM | POA: Diagnosis not present

## 2020-09-03 DIAGNOSIS — E559 Vitamin D deficiency, unspecified: Secondary | ICD-10-CM | POA: Diagnosis not present

## 2020-09-03 DIAGNOSIS — M899 Disorder of bone, unspecified: Secondary | ICD-10-CM | POA: Diagnosis not present

## 2020-09-03 DIAGNOSIS — E1169 Type 2 diabetes mellitus with other specified complication: Secondary | ICD-10-CM | POA: Diagnosis not present

## 2020-09-03 DIAGNOSIS — I10 Essential (primary) hypertension: Secondary | ICD-10-CM | POA: Diagnosis not present

## 2020-09-03 DIAGNOSIS — M8580 Other specified disorders of bone density and structure, unspecified site: Secondary | ICD-10-CM | POA: Diagnosis not present

## 2020-09-03 DIAGNOSIS — E7800 Pure hypercholesterolemia, unspecified: Secondary | ICD-10-CM | POA: Diagnosis not present

## 2020-09-12 DIAGNOSIS — Z23 Encounter for immunization: Secondary | ICD-10-CM | POA: Diagnosis not present

## 2020-10-24 DIAGNOSIS — Z1231 Encounter for screening mammogram for malignant neoplasm of breast: Secondary | ICD-10-CM | POA: Diagnosis not present

## 2020-11-03 DIAGNOSIS — Z23 Encounter for immunization: Secondary | ICD-10-CM | POA: Diagnosis not present

## 2020-11-05 DIAGNOSIS — R922 Inconclusive mammogram: Secondary | ICD-10-CM | POA: Diagnosis not present

## 2020-11-05 DIAGNOSIS — R928 Other abnormal and inconclusive findings on diagnostic imaging of breast: Secondary | ICD-10-CM | POA: Diagnosis not present

## 2020-11-22 ENCOUNTER — Other Ambulatory Visit: Payer: Self-pay | Admitting: Radiology

## 2020-11-22 DIAGNOSIS — Z170 Estrogen receptor positive status [ER+]: Secondary | ICD-10-CM | POA: Diagnosis not present

## 2020-11-22 DIAGNOSIS — N6311 Unspecified lump in the right breast, upper outer quadrant: Secondary | ICD-10-CM | POA: Diagnosis not present

## 2020-11-22 DIAGNOSIS — C50411 Malignant neoplasm of upper-outer quadrant of right female breast: Secondary | ICD-10-CM | POA: Diagnosis not present

## 2020-11-26 ENCOUNTER — Telehealth: Payer: Self-pay | Admitting: Hematology

## 2020-11-26 NOTE — Telephone Encounter (Signed)
Left message for patient to return call in reference to the [facility] on 1/19, needed to know which clinic patient preferred AM or PM

## 2020-11-28 ENCOUNTER — Encounter: Payer: Self-pay | Admitting: *Deleted

## 2020-11-28 DIAGNOSIS — C50411 Malignant neoplasm of upper-outer quadrant of right female breast: Secondary | ICD-10-CM | POA: Insufficient documentation

## 2020-11-28 DIAGNOSIS — Z170 Estrogen receptor positive status [ER+]: Secondary | ICD-10-CM | POA: Insufficient documentation

## 2020-11-30 ENCOUNTER — Encounter: Payer: Self-pay | Admitting: Unknown Physician Specialty

## 2020-12-05 ENCOUNTER — Ambulatory Visit: Payer: Medicare Other | Attending: General Surgery | Admitting: Physical Therapy

## 2020-12-05 ENCOUNTER — Encounter: Payer: Self-pay | Admitting: Hematology

## 2020-12-05 ENCOUNTER — Other Ambulatory Visit: Payer: Self-pay

## 2020-12-05 ENCOUNTER — Ambulatory Visit
Admission: RE | Admit: 2020-12-05 | Discharge: 2020-12-05 | Disposition: A | Discharge status: Home / Self Care | Payer: Medicare Other | Source: Ambulatory Visit | Attending: Radiation Oncology | Admitting: Radiation Oncology

## 2020-12-05 ENCOUNTER — Encounter: Payer: Self-pay | Admitting: *Deleted

## 2020-12-05 ENCOUNTER — Other Ambulatory Visit: Payer: Self-pay | Admitting: General Surgery

## 2020-12-05 ENCOUNTER — Encounter: Payer: Self-pay | Admitting: General Practice

## 2020-12-05 ENCOUNTER — Encounter: Payer: Self-pay | Admitting: Radiation Oncology

## 2020-12-05 ENCOUNTER — Encounter: Payer: Self-pay | Admitting: Physical Therapy

## 2020-12-05 ENCOUNTER — Inpatient Hospital Stay: Payer: Medicare Other | Attending: Hematology | Admitting: Hematology

## 2020-12-05 ENCOUNTER — Inpatient Hospital Stay: Payer: Medicare Other

## 2020-12-05 DIAGNOSIS — C50411 Malignant neoplasm of upper-outer quadrant of right female breast: Secondary | ICD-10-CM | POA: Diagnosis not present

## 2020-12-05 DIAGNOSIS — Z170 Estrogen receptor positive status [ER+]: Secondary | ICD-10-CM

## 2020-12-05 DIAGNOSIS — K219 Gastro-esophageal reflux disease without esophagitis: Secondary | ICD-10-CM | POA: Insufficient documentation

## 2020-12-05 DIAGNOSIS — E785 Hyperlipidemia, unspecified: Secondary | ICD-10-CM | POA: Insufficient documentation

## 2020-12-05 DIAGNOSIS — E119 Type 2 diabetes mellitus without complications: Secondary | ICD-10-CM | POA: Diagnosis not present

## 2020-12-05 DIAGNOSIS — M8580 Other specified disorders of bone density and structure, unspecified site: Secondary | ICD-10-CM | POA: Insufficient documentation

## 2020-12-05 DIAGNOSIS — Z800 Family history of malignant neoplasm of digestive organs: Secondary | ICD-10-CM | POA: Diagnosis not present

## 2020-12-05 DIAGNOSIS — Z79899 Other long term (current) drug therapy: Secondary | ICD-10-CM | POA: Diagnosis not present

## 2020-12-05 DIAGNOSIS — I10 Essential (primary) hypertension: Secondary | ICD-10-CM | POA: Insufficient documentation

## 2020-12-05 DIAGNOSIS — M797 Fibromyalgia: Secondary | ICD-10-CM | POA: Diagnosis not present

## 2020-12-05 DIAGNOSIS — Z808 Family history of malignant neoplasm of other organs or systems: Secondary | ICD-10-CM | POA: Diagnosis not present

## 2020-12-05 DIAGNOSIS — R293 Abnormal posture: Secondary | ICD-10-CM | POA: Diagnosis not present

## 2020-12-05 DIAGNOSIS — Z7984 Long term (current) use of oral hypoglycemic drugs: Secondary | ICD-10-CM | POA: Diagnosis not present

## 2020-12-05 DIAGNOSIS — D649 Anemia, unspecified: Secondary | ICD-10-CM | POA: Insufficient documentation

## 2020-12-05 DIAGNOSIS — Z840 Family history of diseases of the skin and subcutaneous tissue: Secondary | ICD-10-CM | POA: Diagnosis not present

## 2020-12-05 LAB — CMP (CANCER CENTER ONLY)
ALT: 32 U/L (ref 0–44)
AST: 42 U/L — ABNORMAL HIGH (ref 15–41)
Albumin: 3.9 g/dL (ref 3.5–5.0)
Alkaline Phosphatase: 66 U/L (ref 38–126)
Anion gap: 13 (ref 5–15)
BUN: 15 mg/dL (ref 8–23)
CO2: 27 mmol/L (ref 22–32)
Calcium: 8.9 mg/dL (ref 8.9–10.3)
Chloride: 102 mmol/L (ref 98–111)
Creatinine: 0.84 mg/dL (ref 0.44–1.00)
GFR, Estimated: >60 mL/min (ref >60)
Glucose, Bld: 123 mg/dL — ABNORMAL HIGH (ref 70–99)
Potassium: 3.6 mmol/L (ref 3.5–5.1)
Sodium: 142 mmol/L (ref 135–145)
Total Bilirubin: 0.3 mg/dL (ref 0.3–1.2)
Total Protein: 7.5 g/dL (ref 6.5–8.1)

## 2020-12-05 LAB — CBC WITH DIFFERENTIAL (CANCER CENTER ONLY)
Abs Immature Granulocytes: 0.01 10*3/uL (ref 0.00–0.07)
Basophils Absolute: 0 10*3/uL (ref 0.0–0.1)
Basophils Relative: 1 %
Eosinophils Absolute: 0.1 10*3/uL (ref 0.0–0.5)
Eosinophils Relative: 2 %
HCT: 34.9 % — ABNORMAL LOW (ref 36.0–46.0)
Hemoglobin: 10.7 g/dL — ABNORMAL LOW (ref 12.0–15.0)
Immature Granulocytes: 0 %
Lymphocytes Relative: 31 %
Lymphs Abs: 1.9 10*3/uL (ref 0.7–4.0)
MCH: 25.5 pg — ABNORMAL LOW (ref 26.0–34.0)
MCHC: 30.7 g/dL (ref 30.0–36.0)
MCV: 83.1 fL (ref 80.0–100.0)
Monocytes Absolute: 0.8 10*3/uL (ref 0.1–1.0)
Monocytes Relative: 13 %
Neutro Abs: 3.2 10*3/uL (ref 1.7–7.7)
Neutrophils Relative %: 53 %
Platelet Count: 279 10*3/uL (ref 150–400)
RBC: 4.2 MIL/uL (ref 3.87–5.11)
RDW: 17.1 % — ABNORMAL HIGH (ref 11.5–15.5)
WBC Count: 6 10*3/uL (ref 4.0–10.5)
nRBC: 0 % (ref 0.0–0.2)

## 2020-12-05 LAB — GENETIC SCREENING ORDER

## 2020-12-05 NOTE — Patient Instructions (Signed)

## 2020-12-05 NOTE — Therapy (Signed)
[place], [place], [address] Phone: [phone]   Fax:  [phone]  Physical Therapy Evaluation  Patient Details  Name: Kayla Bennett MRN: [id number] Date of Birth: 1951/05/31 Referring Provider (PT): Dr. [name]   Encounter Date: 12/05/2020   PT End of Session - 12/05/20 1536    Visit Number 1    Number of Visits 2    Date for PT Re-Evaluation 01/30/21    PT Start Time 1432    PT Stop Time 1458    PT Time Calculation (min) 26 min    Activity Tolerance Patient tolerated treatment well    Behavior During Therapy [facility] for tasks assessed/performed           Past Medical History:  Diagnosis Date  . Diabetes ([place])   . Fibromyalgia   . GERD (gastroesophageal reflux disease)   . Osteopenia   . Psoriasis     Past Surgical History:  Procedure Laterality Date  . ABDOMINAL HYSTERECTOMY    . APPENDECTOMY    . [place]  . HYSTERECTOMY ABDOMINAL WITH SALPINGO-OOPHORECTOMY    . SHOULDER SURGERY Left     There were no vitals filed for this visit.    Subjective Assessment - 12/05/20 1529    Subjective Patient reports she is here today to be seen by hre medical team for her newly diagnosed right breast cancer.    Patient is accompained by: Family member    Pertinent History Patient was diagnosed on 11/22/2020 with right invasive ductal carcinoma breast cancer. It measures 9 mm and is located in the upper outer quadrant. It is ER/PR positive and HER2 negative with a Ki67 of 10%. She has many orthopedica problems from a traumatic fall and other accidents.    Patient Stated Goals reduce lymphedema risk and learn post op shoulder ROM HEP    Currently in Pain? Yes    Pain Score 5    Varies from day to day   Pain Location Shoulder    Pain Orientation Left    Pain Descriptors / Indicators Aching    Pain Type Chronic pain    Pain Onset More than a month ago    Pain Frequency  Constant    Aggravating Factors  Moving arm    Pain Relieving Factors rest              [employer] PT Assessment - 12/05/20 0001      Assessment   Medical Diagnosis Right breast cancer    Referring Provider (PT) Dr. [name]    Onset Date/Surgical Date 11/22/20    Hand Dominance Right    Prior Therapy none      Precautions   Precautions Other (comment)    Precaution Comments active cancer      Restrictions   Weight Bearing Restrictions No      Balance Screen   Has the patient fallen in the past 6 months No    Has the patient had a decrease in activity level because of a fear of falling?  No    Is the patient reluctant to leave their home because of a fear of falling?  No      Home Environment   Living Environment Private residence    Living Arrangements Spouse/significant other    Available Help at Discharge Family      Prior Function   Level of Independence Independent    Vocation Part time employment  Vocation Requirements Sign language interpreter    Leisure She does not exercise      Cognition   Overall Cognitive Status Within Functional Limits for tasks assessed      Posture/Postural Control   Posture/Postural Control Postural limitations    Postural Limitations Rounded Shoulders;Forward head      ROM / Strength   AROM / PROM / Strength AROM;Strength      AROM   Overall AROM Comments Left cervical rotation limited 25%; others WNL    AROM Assessment Site Shoulder    Right/Left Shoulder Right;Left    Right Shoulder Extension 46 Degrees    Right Shoulder Flexion 152 Degrees    Right Shoulder ABduction 45 Degrees    Right Shoulder Internal Rotation 70 Degrees    Right Shoulder External Rotation 74 Degrees    Left Shoulder Extension 27 Degrees    Left Shoulder Flexion 62 Degrees    Left Shoulder ABduction 152 Degrees    Left Shoulder Internal Rotation --   Not tested due to limited abduction   Left Shoulder External Rotation --   Not tested due to  limited abduction     Strength   Overall Strength Within functional limits for tasks performed   Right shoulder WNL; left shoulder not assessed            LYMPHEDEMA/ONCOLOGY QUESTIONNAIRE - 12/05/20 0001      Type   Cancer Type Right breast cancer      Lymphedema Assessments   Lymphedema Assessments Upper extremities      Right Upper Extremity Lymphedema   10 cm Proximal to Olecranon Process 35.2 cm    Olecranon Process 25.4 cm    10 cm Proximal to Ulnar Styloid Process 24.1 cm    Just Proximal to Ulnar Styloid Process 16.9 cm    Across Hand at [employer] 17.6 cm    At [place] of 2nd Digit 6.3 cm      Left Upper Extremity Lymphedema   10 cm Proximal to Olecranon Process 36.3 cm    Olecranon Process 26.9 cm    10 cm Proximal to Ulnar Styloid Process 24.5 cm    Just Proximal to Ulnar Styloid Process 17.1 cm    Across Hand at [employer] 16.9 cm    At [place] of 2nd Digit 6.1 cm           L-DEX FLOWSHEETS - 12/05/20 1500      L-DEX LYMPHEDEMA SCREENING   Measurement Type Unilateral    L-DEX MEASUREMENT EXTREMITY Upper Extremity    POSITION  Standing    DOMINANT SIDE Right    At Risk Side Right    BASELINE SCORE (UNILATERAL) -3.4           The patient was assessed using the L-Dex machine today to produce a lymphedema index baseline score. The patient will be reassessed on a regular basis (typically every 3 months) to obtain new L-Dex scores. If the score is > 6.5 points away from his/her baseline score indicating onset of subclinical lymphedema, it will be recommended to wear a compression garment for 4 weeks, 12 hours per day and then be reassessed. If the score continues to be > 6.5 points from baseline at reassessment, we will initiate lymphedema treatment. Assessing in this manner has a 95% rate of preventing clinically significant lymphedema.      Katina Dung - 12/05/20 0001    Open a tight or new jar Severe difficulty  Do heavy household chores (wash  walls, wash floors) Unable    Carry a shopping bag or briefcase Mild difficulty    Wash your back Mild difficulty    Use a knife to cut food No difficulty    Recreational activities in which you take some force or impact through your arm, shoulder, or hand (golf, hammering, tennis) Unable    During the past week, to what extent has your arm, shoulder or hand problem interfered with your normal social activities with family, friends, neighbors, or groups? Slightly    During the past week, to what extent has your arm, shoulder or hand problem limited your work or other regular daily activities Slightly    Arm, shoulder, or hand pain. Mild    Tingling (pins and needles) in your arm, shoulder, or hand None    Difficulty Sleeping Mild difficulty    DASH Score 38.64 %            Objective measurements completed on examination: See above findings.       Patient was instructed today in a home exercise program today for post op shoulder range of motion. These included active assist shoulder flexion in sitting, scapular retraction, wall walking with shoulder abduction, and hands behind head external rotation.  She was encouraged to do these twice a day, holding 3 seconds and repeating 5 times when permitted by her physician.            PT Education - 12/05/20 1535    Education Details Lymphedema risk reduction and post op shoulder ROM HEP    Person(s) Educated Patient;Spouse    Methods Explanation;Demonstration;Handout    Comprehension Returned demonstration;Verbalized understanding               PT Long Term Goals - 12/05/20 1539      PT LONG TERM GOAL #1   Title Patient will demonstrate she has regained full shoulder ROM and function post operatively compared to baselines.    Time 8    Period Weeks    Status New    Target Date 01/30/21           Breast Clinic Goals - 12/05/20 1538      Patient will be able to verbalize understanding of pertinent lymphedema risk  reduction practices relevant to her diagnosis specifically related to skin care.   Time 1    Period Days    Status Achieved      Patient will be able to return demonstrate and/or verbalize understanding of the post-op home exercise program related to regaining shoulder range of motion.   Time 1    Period Days    Status Achieved      Patient will be able to verbalize understanding of the importance of attending the postoperative After Breast Cancer Class for further lymphedema risk reduction education and therapeutic exercise.   Time 1    Period Days    Status Achieved                 Plan - 12/05/20 1537    Clinical Impression Statement Patient was diagnosed on 11/22/2020 with right invasive ductal carcinoma breast cancer. It measures 9 mm and is located in the upper outer quadrant. It is ER/PR positive and HER2 negative with a Ki67 of 10%. She has many orthopedica problems from a traumatic fall and other accidents. Her multidisciplinary medical team met prior to her assessments to determine a recommended treatment plan. She is planning to have  a right lumpectomy and sentinel node biopsy followed by radiation and anti-estrogen therapy. She will benefit from a post op PT reassessment and from L-Dex screens every 3 months for 2 years to detect subclinical lymphedema.    Stability/Clinical Decision Making Stable/Uncomplicated    Clinical Decision Making Low    Rehab Potential Excellent    PT Frequency --   Eval and 1 f/u visit   PT Treatment/Interventions ADLs/Self Care Home Management;Therapeutic exercise;Patient/family education    PT Next Visit Plan Will reassess 3-4 weeks post op    PT Home Exercise Plan Post op shoulder ROM HEP    Consulted and Agree with Plan of Care Patient;Family member/caregiver    Family Member Consulted Husband           Patient will benefit from skilled therapeutic intervention in order to improve the following deficits and impairments:  Postural  dysfunction,Decreased range of motion,Decreased knowledge of precautions,Impaired UE functional use,Pain  Visit Diagnosis: Malignant neoplasm of upper-outer quadrant of right breast in female, estrogen receptor positive ([place]) - Plan: PT plan of care cert/re-cert  Abnormal posture - Plan: PT plan of care cert/re-cert   Patient will follow up at outpatient cancer rehab 3-4 weeks following surgery.  If the patient requires physical therapy at that time, a specific plan will be dictated and sent to the referring physician for approval. The patient was educated today on appropriate basic range of motion exercises to begin post operatively and the importance of attending the After Breast Cancer class following surgery.  Patient was educated today on lymphedema risk reduction practices as it pertains to recommendations that will benefit the patient immediately following surgery.  She verbalized good understanding.      Problem List Patient Active Problem List   Diagnosis Date Noted  . Malignant neoplasm of upper-outer quadrant of right breast in female, estrogen receptor positive ([place]) 11/28/2020   Annia Friendly, PT 12/05/20 3:43 PM  [place], [place], [address] Phone: [phone]   Fax:  [phone]  Name: Kayla Bennett MRN: [id number] Date of Birth: 08-07-1951

## 2020-12-05 NOTE — Progress Notes (Signed)
[facility] Psychosocial Distress Screening Spiritual Care  Met with Teairra, who goes by "Kayla Bennett," and her husband Pilar Plate in [facility] to introduce [facility] team/resources, reviewing distress screen per protocol.  The patient scored a 2 on the Psychosocial Distress Thermometer which indicates mild distress. Also assessed for distress and other psychosocial needs.   ONCBCN DISTRESS SCREENING 12/05/2020  Screening Type Initial Screening  Distress experienced in past week (1-10) 2  Physical Problem type Tingling hands/feet  Referral to support programs Yes   Kayla Bennett describes herself as a "take it as it comes" person, reporting minimal distress. She and husband Pilar Plate plan to tell their sons (24, 32) about her diagnosis now that they have learned more at [facility]. They report no needs, concerns, or questions.  Follow up needed: No. Couple is aware of ongoing Spiritual Care availability, should needs arise or circumstances change.   [place], [place], [facility] Pager [phone] Voicemail [phone]

## 2020-12-05 NOTE — Progress Notes (Signed)
[place]   Telephone:(336) [phone] Fax:([phone]) [place] Note   Patient Care Team: Mauro Kaufmann, RN as Oncology Nurse Navigator Rockwell Germany, RN as Oncology Nurse Navigator [name], MD as Consulting Physician (General Surgery) Truitt Merle, MD as Consulting Physician (Hematology) Eppie Gibson, MD as Attending Physician (Radiation Oncology)  Date of Service:  12/05/2020   CHIEF COMPLAINTS/PURPOSE OF CONSULTATION:  Newly Diagnosed Malignant neoplasm of upper-outer quadrant of right breast    Oncology History Overview Note  Cancer Staging Malignant neoplasm of upper-outer quadrant of right breast in female, estrogen receptor positive ([place]) Staging form: Breast, AJCC 8th Edition - Clinical stage from 11/22/2020: Stage IA (cT1b, cN0, cM0, G2, ER+, PR+, HER2-) - Signed by Truitt Merle, MD on 12/04/2020    Malignant neoplasm of upper-outer quadrant of right breast in female, estrogen receptor positive ([place])  11/05/2020 Breast US   US Breast 11/05/20  IMPRESSION No Sonographic correlate is seen for the round microlobulated 47mm mass in the upper outer right breast 10cm from the nipple, 9: 00 position.  Given this mass is new on mammography and has suspicious borders, a stereotactic guided biopsy is recommended.     11/22/2020 Cancer Staging   Staging form: Breast, AJCC 8th Edition - Clinical stage from 11/22/2020: Stage IA (cT1b, cN0, cM0, G2, ER+, PR+, HER2-) - Signed by Truitt Merle, MD on 12/04/2020   11/22/2020 Initial Biopsy    Diagnosis 11/22/20  Breast, right, needle core biopsy, 10 cmfn, upper outer quadrant, post depth - INVASIVE DUCTAL CARCINOMA. SEE NOTE Diagnosis Note Carcinoma measures 0.7 cm in greatest linear dimension and appears grade 2. Dr. Tresa Moore reviewed the case and concurs with the diagnosis. A breast prognostic profile (ER, PR, Ki-67 and HER2) is pending and will be reported in an addendum. Dr. Luan Pulling was notified on 11/23/2020.    11/22/2020 Receptors her2    ROGNOSTIC INDICATORS Results: IMMUNOHISTOCHEMICAL AND MORPHOMETRIC ANALYSIS PERFORMED MANUALLY The tumor cells are NEGATIVE for Her2 (1+). Estrogen Receptor: 95%, POSITIVE, STRONG STAINING INTENSITY Progesterone Receptor: 95%, POSITIVE, STRONG STAINING INTENSITY Proliferation Marker Ki67: 10%   11/28/2020 Initial Diagnosis   Malignant neoplasm of upper-outer quadrant of right breast in female, estrogen receptor positive ([place])      HISTORY OF PRESENTING ILLNESS:  Kayla Bennett 70 y.o. female is a here because of newly diagnosed right breast cancer. The patient presents to the Breast clinic today accompanied by her husband.   Her right breast mass was found by screening mammogram. She has had yearly mammogram and needed to monitor twice before. No biopsy before. She denies nipple, breast or skin changes.  Pt notes chronic MSK pain from left upper arm deformity, right shoulder pain and left knee pain and left hip. She ambulated with cane as needed. She has required injections in the past. She limits her walking.    Socially she is marries. She has 2 children. She works as a Agricultural engineer. She notes she did have hot flashes for a long time but manageable. She does not drink alcohol and does not smoke.    She has a PMHx of chronic joint pain. She has DM controlled on metformin, HTN on losartan/HCTZ. She has fibromyalgia, GERD, HLD on injections and osteopenia. She has neuropathy in her feet, on Gabapentin. She had hysterectomy and BSO, Appendectomy and C-section. Her father had colon cancer at age 67, no breast cancer, melanoma of maternal uncle and paternal uncle. Her father and paternal uncle  and paternal cousin had colon cancer.   GYN HISTORY Menarchal: [age] years old  LMP: 1985 Contraceptive: 6 months  HRT: No GP2: first at age 63     REVIEW OF SYSTEMS:    Constitutional: Denies fevers, chills or abnormal night sweats Eyes: Denies  blurriness of vision, double vision or watery eyes Ears, nose, mouth, throat, and face: Denies mucositis or sore throat Respiratory: Denies cough, dyspnea or wheezes Cardiovascular: Denies palpitation, chest discomfort or lower extremity swelling Gastrointestinal:  Denies nausea, heartburn or change in bowel habits Skin: Denies abnormal skin rashes Lymphatics: Denies new lymphadenopathy or easy bruising Neurological:Denies numbness, tingling or new weaknesses Behavioral/Psych: Mood is stable, no new changes  All other systems were reviewed with the patient and are negative.   MEDICAL HISTORY:  Past Medical History:  Diagnosis Date  . Diabetes ([place])   . Fibromyalgia   . GERD (gastroesophageal reflux disease)   . Osteopenia   . Psoriasis     SURGICAL HISTORY: Past Surgical History:  Procedure Laterality Date  . ABDOMINAL HYSTERECTOMY    . APPENDECTOMY    . [place]  . CHOLECYSTECTOMY    . HYSTERECTOMY ABDOMINAL WITH SALPINGO-OOPHORECTOMY    . LYSIS OF ADHESION    . SHOULDER SURGERY Left     SOCIAL HISTORY: Social History   Socioeconomic History  . Marital status: Married    Spouse name: Not on file  . Number of children: 2  . Years of education: Not on file  . Highest education level: Not on file  Occupational History  . Not on file  Tobacco Use  . Smoking status: Never Smoker  . Smokeless tobacco: Never Used  Vaping Use  . Vaping Use: Never used  Substance and Sexual Activity  . Alcohol use: Never  . Drug use: Never  . Sexual activity: Not on file  Other Topics Concern  . Not on file  Social History Narrative  . Not on file   Social Determinants of Health   Financial Resource Strain: Not on file  Food Insecurity: Not on file  Transportation Needs: Not on file  Physical Activity: Not on file  Stress: Not on file  Social Connections: Not on file  Intimate Partner Violence: Not on file    FAMILY HISTORY: Family History   Problem Relation Age of Onset  . Colon cancer Father 10  . Cancer Maternal Uncle        melanoma   . Colon cancer Paternal Uncle   . Colon cancer Cousin     ALLERGIES:  is allergic to lotensin [benazepril hcl], morphine, erythromycin base, hydrocodone, and wound dressing adhesive.  MEDICATIONS:  Current Outpatient Medications  Medication Sig Dispense Refill  . albuterol (VENTOLIN HFA) 108 (90 Base) MCG/ACT inhaler Inhale into the lungs.    . Alirocumab (PRALUENT) 150 MG/ML SOAJ Inject 1 mL into the skin every 14 (fourteen) days.    . calcium-vitamin D (OSCAL WITH D) 500-200 MG-UNIT TABS tablet Take 1 tablet by mouth daily.    . cetirizine (ZYRTEC) 10 MG tablet Take by mouth.    . gabapentin (NEURONTIN) 100 MG capsule Take 200 mg by mouth.    . losartan-hydrochlorothiazide (HYZAAR) 100-25 MG tablet     . metFORMIN (GLUCOPHAGE-XR) 500 MG 24 hr tablet Take 500 mg by mouth daily.    . pantoprazole (PROTONIX) 40 MG tablet Take by mouth.    . STUDY - ASPIRE - aspirin 81 mg or placebo tablet (PI-Sethi) Take  by mouth.    . venlafaxine XR (EFFEXOR-XR) 75 MG 24 hr capsule Take 75 mg by mouth daily.     No current facility-administered medications for this visit.    PHYSICAL EXAMINATION: ECOG PERFORMANCE STATUS: 0 - Asymptomatic GENERAL:alert, no distress and comfortable SKIN: skin color, texture, turgor are normal, no rashes or significant lesions EYES: normal, Conjunctiva are pink and non-injected, sclera clear  NECK: supple, thyroid normal size, non-tender, without nodularity LYMPH:  no palpable lymphadenopathy in the cervical, axillary  LUNGS: clear to auscultation and percussion with normal breathing effort HEART: regular rate & rhythm and no murmurs and no lower extremity edema ABDOMEN:abdomen soft, non-tender and normal bowel sounds Musculoskeletal:no cyanosis of digits and no clubbing  NEURO: alert & oriented x 3 with fluent speech, no focal motor/sensory deficits BREAST: No  palpable mass, nodules or adenopathy bilaterally. Breast exam benign.  LABORATORY DATA:  I have reviewed the data as listed CBC Latest Ref Rng & Units 12/05/2020  WBC 4.0 - 10.5 K/uL 6.0  Hemoglobin 12.0 - 15.0 g/dL 10.7(L)  Hematocrit 36.0 - 46.0 % 34.9(L)  Platelets 150 - 400 K/uL 279    CMP Latest Ref Rng & Units 12/05/2020  Glucose 70 - 99 mg/dL 123(H)  BUN 8 - 23 mg/dL 15  Creatinine 0.44 - 1.00 mg/dL 0.84  Sodium 135 - 145 mmol/L 142  Potassium 3.5 - 5.1 mmol/L 3.6  Chloride 98 - 111 mmol/L 102  CO2 22 - 32 mmol/L 27  Calcium 8.9 - 10.3 mg/dL 8.9  Total Protein 6.5 - 8.1 g/dL 7.5  Total Bilirubin 0.3 - 1.2 mg/dL 0.3  Alkaline Phos 38 - 126 U/L 66  AST 15 - 41 U/L 42(H)  ALT 0 - 44 U/L 32     RADIOGRAPHIC STUDIES: I have personally reviewed the radiological images as listed and agreed with the findings in the report. No results found.  ASSESSMENT & PLAN:  Kayla Bennett is a 70 y.o. Caucasian female with a history of chronic joint pain, DM, HTN, fibromyalgia, GERD, HLD, osteopenia, neuropathy   1. Malignant neoplasm of upper-outer quadrant of right breast, Stage IA, c(T1bN0M0), ER+/PR+/HER2-, Grade II  -We discussed her image findings and the biopsy results including ER/PR/HER2 in great details. She has 66mm mass in right breast and biopsy confirmed invasive ductal carcinoma.  -Given the early stage disease, she likely need a lumpectomy with Sentinel LN biopsy. She is agreeable with that. She was seen by Dr. Barry Dienes today and likely will proceed with surgery soon.  -I recommend a Oncotype Dx test on the surgical sample and we'll make a decision about adjuvant chemotherapy based on the Oncotype result. Written material of this test was given to her. She is overall in good health and fit, would be a candidate for chemotherapy if her Oncotype recurrence score is high. -If her surgical sentinel lymph node positive, I recommend mammaprint for further risk stratification and  guide adjuvant chemotherapy. -The risk of recurrence depends on the stage and biology of the tumor. She is early stage, with ER/PR positive and HER2 negative markers. I discussed this is the more common type of slow growing tumor.  -She was also seen by radiation oncologist Dr. Isidore Moos today. Adjuvant radiation is recommended after lumpectomy to reduce her risk of local recurrence.   -Given the strong ER and PR expression, and medical comorbilities, I recommend adjuvant endocrine therapy with Tamoxifen for a total of 5-10 years to reduce the risk of cancer recurrence. Potential benefits  and side effects were discussed with patient and she is interested. ---The potential side effects, which includes but not limited to, hot flash, skin and vaginal dryness, slightly increased risk of cardiovascular disease and cataract, small risk of thrombosis and endometrial cancer, were discussed with her in great details. Preventive strategies for thrombosis, such as being physically active, using compression stocks, avoid cigarette smoking, etc., were reviewed with her. She has had hysterectomy, no concern for endometrial cancer from Tamoxifen, etc. She voiced good understanding, and agrees to proceed. Will start after she completes adjuvant breast radiation. -We also discussed the breast cancer surveillance after her surgery. She will continue annual screening mammogram, self exam, and a routine office visit with lab and exam with [place]. -Initial breast exam unremarkable. Labs reviewed, CBC and CMP WNL except Hg 10.7, BG 123, AST 42.  -Will f/u with her after radiation or Surgery based on path    2. Comorbidities: Chronic joint pain, DM, HTN, fibromyalgia, GERD, HLD, osteopenia, Neuropathy -her joint pain is in b/l shoulders, left knee and hip with very limited ROM of left shoulder.  -On medications (metformin, losartan/HCTZ, injections for HLD, Gabapentin, Protonix.  -Continue to F/u with PCP Dr Dyane Dustman  -She has  had osteoporosis in her 45s and now osteopenic on 10/2017 DEXA. I discussed Tamoxifen can help strengthen her bones.    3. Genetics  -She has extensive paternal family history of colon cancer and melanoma. She is eligible for genetic testing. She is interested, will proceed with today.    4. Anemia  -Her Hg at 10.7 today (12/05/20). Pt notes h/o anemia years ago especially around the time of her hysterectomy. Her recent PCP labs have been normal.  -I will do anemia work up with next labs. She is agreeable. If indicated she can start prenatal vitamins.    PLAN:  -Genetic referral  -Proceed with surgery soon  -Oncotype on her surgical sample -F/u after radiation or sooner if Oncotype shows high risk disease   No orders of the defined types were placed in this encounter.   All questions were answered. The patient knows to call the clinic with any problems, questions or concerns. The total time spent in the appointment was 55 minutes.     Truitt Merle, MD 12/05/2020   I, Joslyn Devon, am acting as scribe for Truitt Merle, MD.   I have reviewed the above documentation for accuracy and completeness, and I agree with the above.

## 2020-12-05 NOTE — Progress Notes (Signed)
Radiation Oncology         [phone] ________________________________  Initial Outpatient Consultation  Name: Kayla Bennett MRN: [id number]  Date: 12/05/2020  DOB: [birth year]  CC:No primary care provider on file.  [name], MD   REFERRING PHYSICIAN: [name], MD  DIAGNOSIS:    ICD-10-CM   1. Malignant neoplasm of upper-outer quadrant of right breast in female, estrogen receptor positive ([place])  C50.411    Z17.0     Cancer Staging Malignant neoplasm of upper-outer quadrant of right breast in female, estrogen receptor positive ([place]) Staging form: Breast, AJCC 8th Edition - Clinical stage from 11/22/2020: Stage IA (cT1b, cN0, cM0, G2, ER+, PR+, HER2-) - Signed by Truitt Merle, MD on 12/04/2020  CHIEF COMPLAINT: Here to discuss management of right breast cancer  HISTORY OF PRESENT ILLNESS::Kayla Bennett is a 70 y.o. female who presented with right breast abnormality on the following imaging: bilateral screening mammogram on the date of 10/24/2020. No symptoms were reported at that time. Ultrasound of right breast on 11/05/2020 did not show any sonographic correlate for the round microlobulated 9 mm mass in the upper outer right breast 10 cm from the nipple. Biopsy on the date of 11/22/2020 showed invasive ductal carcinoma. ER status: 95% strong; PR status: 95% strong; Her2 status: negative; Grade: 2.  She works as a Copy.  She has been fully vaccinated against COVID.  She is she is here with her husband today.  PREVIOUS RADIATION THERAPY: No  PAST MEDICAL HISTORY:  has a past medical history of Diabetes ([place]), Fibromyalgia, GERD (gastroesophageal reflux disease), Osteopenia, and Psoriasis.    PAST SURGICAL HISTORY: Past Surgical History:  Procedure Laterality Date  . ABDOMINAL HYSTERECTOMY    . APPENDECTOMY    . [place]  . CHOLECYSTECTOMY    . HYSTERECTOMY ABDOMINAL WITH SALPINGO-OOPHORECTOMY    . LYSIS OF ADHESION    .  SHOULDER SURGERY Left     FAMILY HISTORY: family history includes Cancer in her maternal uncle; Colon cancer in her cousin and paternal uncle; Colon cancer (age of onset: 95) in her father.  SOCIAL HISTORY:  reports that she has never smoked. She has never used smokeless tobacco. She reports that she does not drink alcohol and does not use drugs.  ALLERGIES: Lotensin [benazepril hcl], Morphine, Erythromycin base, Hydrocodone, and Wound dressing adhesive  MEDICATIONS:  Current Outpatient Medications  Medication Sig Dispense Refill  . albuterol (VENTOLIN HFA) 108 (90 Base) MCG/ACT inhaler Inhale into the lungs.    . Alirocumab (PRALUENT) 150 MG/ML SOAJ Inject 1 mL into the skin every 14 (fourteen) days.    . calcium-vitamin D (OSCAL WITH D) 500-200 MG-UNIT TABS tablet Take 1 tablet by mouth daily.    . cetirizine (ZYRTEC) 10 MG tablet Take by mouth.    . gabapentin (NEURONTIN) 100 MG capsule Take 200 mg by mouth.    . losartan-hydrochlorothiazide (HYZAAR) 100-25 MG tablet     . metFORMIN (GLUCOPHAGE-XR) 500 MG 24 hr tablet Take 500 mg by mouth daily.    . pantoprazole (PROTONIX) 40 MG tablet Take by mouth.    . STUDY - ASPIRE - aspirin 81 mg or placebo tablet (PI-Sethi) Take by mouth.    . venlafaxine XR (EFFEXOR-XR) 75 MG 24 hr capsule Take 75 mg by mouth daily.     No current facility-administered medications for this encounter.    REVIEW OF SYSTEMS: as above  PHYSICAL EXAM:  vitals were not taken  for this visit.   General: Alert and oriented, in no acute distress HEENT: Head is normocephalic Heart: Regular in rate with intermittent premature beats.  No murmurs. Chest: Clear to auscultation bilaterally, with no rhonchi, wheezes, or rales. Abdomen: Soft, nontender, nondistended, with no rigidity or guarding. MSK: Decreased range of motion in left shoulder; she takes her time and uses caution when lying down on the exam table Neurologic: No obvious focalities. Speech is fluent.  Coordination is intact. Psychiatric: Judgment and insight are intact. Affect is appropriate. Breasts: Postbiopsy changes in the right breast. No palpable masses appreciated in the breasts or axillae bilaterally.    ECOG = 0  0 - Asymptomatic (Fully active, able to carry on all predisease activities without restriction)  1 - Symptomatic but completely ambulatory (Restricted in physically strenuous activity but ambulatory and able to carry out work of a light or sedentary nature. For example, light housework, office work)  2 - Symptomatic, <50% in bed during the day (Ambulatory and capable of all self care but unable to carry out any work activities. Up and about more than 50% of waking hours)  3 - Symptomatic, >50% in bed, but not bedbound (Capable of only limited self-care, confined to bed or chair 50% or more of waking hours)  4 - Bedbound (Completely disabled. Cannot carry on any self-care. Totally confined to bed or chair)  5 - Death   Eustace Pen MM, Creech RH, Tormey DC, et al. [phone]). "Toxicity and response criteria of the [facility] Group". [place] Oncol. 5 (6): 649-55   LABORATORY DATA:  Lab Results  Component Value Date   WBC 6.0 12/05/2020   HGB 10.7 (L) 12/05/2020   HCT 34.9 (L) 12/05/2020   MCV 83.1 12/05/2020   PLT 279 12/05/2020   CMP     Component Value Date/Time   NA 142 12/05/2020 1227   K 3.6 12/05/2020 1227   CL 102 12/05/2020 1227   CO2 27 12/05/2020 1227   GLUCOSE 123 (H) 12/05/2020 1227   BUN 15 12/05/2020 1227   CREATININE 0.84 12/05/2020 1227   CALCIUM 8.9 12/05/2020 1227   PROT 7.5 12/05/2020 1227   ALBUMIN 3.9 12/05/2020 1227   AST 42 (H) 12/05/2020 1227   ALT 32 12/05/2020 1227   ALKPHOS 66 12/05/2020 1227   BILITOT 0.3 12/05/2020 1227   GFRNONAA >60 12/05/2020 1227         RADIOGRAPHY: As above   IMPRESSION/PLAN: Right breast cancer  It was a pleasure meeting the patient today. We discussed the risks, benefits, and  side effects of radiotherapy. I recommend radiotherapy to the right breast to reduce her risk of locoregional recurrence by 2/3.  We discussed that radiation would take approximately 4 weeks to complete and that I would give the patient a few weeks to heal following surgery before starting treatment planning.  If chemotherapy were to be given, this would precede radiotherapy. We spoke about acute effects including skin irritation and fatigue as well as much less common late effects including internal organ injury or irritation. We spoke about the latest technology that is used to minimize the risk of late effects for patients undergoing radiotherapy to the breast or chest wall. No guarantees of treatment were given. The patient is enthusiastic about proceeding with treatment. I look forward to participating in the patient's care.  I will await her referral back to me for postoperative follow-up and eventual CT simulation/treatment planning.  We discussed measures to reduce the  risk of infection during the COVID-19 pandemic.  She has been fully vaccinated.  We talked about the importance of wearing high-quality masks given the contagiousness of the omicron variant.  She is very receptive to these recommendations.  On date of service, in total, I spent 46 minutes on this encounter. Patient was seen in person.   __________________________________________   Eppie Gibson, MD  This document serves as a record of services personally performed by Eppie Gibson, MD. It was created on his behalf by Clerance Lav, a trained medical scribe. The creation of this record is based on the scribe's personal observations and the provider's statements to them. This document has been checked and approved by the attending provider.

## 2020-12-07 ENCOUNTER — Telehealth: Payer: Self-pay | Admitting: Hematology

## 2020-12-07 NOTE — Telephone Encounter (Signed)
No 1/19 los. No changes made to pt's schedule.  

## 2020-12-13 ENCOUNTER — Telehealth: Payer: Self-pay | Admitting: *Deleted

## 2020-12-13 ENCOUNTER — Encounter: Payer: Self-pay | Admitting: *Deleted

## 2020-12-13 NOTE — Telephone Encounter (Signed)
Spoke with patient to follow up from [facility] 1/19 and assess navigation needs.Patient denies any needs or concerns at this time. Encouraged her to call should anything arise.Patient verbalized understanding.

## 2020-12-17 ENCOUNTER — Other Ambulatory Visit (HOSPITAL_COMMUNITY)
Admission: RE | Admit: 2020-12-17 | Discharge: 2020-12-17 | Disposition: A | Discharge status: Home / Self Care | Payer: Medicare Other | Source: Ambulatory Visit | Attending: General Surgery | Admitting: General Surgery

## 2020-12-17 DIAGNOSIS — Z20822 Contact with and (suspected) exposure to covid-19: Secondary | ICD-10-CM | POA: Diagnosis not present

## 2020-12-17 DIAGNOSIS — Z01812 Encounter for preprocedural laboratory examination: Secondary | ICD-10-CM | POA: Diagnosis not present

## 2020-12-17 LAB — SARS CORONAVIRUS 2 (TAT 6-24 HRS): SARS Coronavirus 2: NEGATIVE

## 2020-12-17 NOTE — Pre-Procedure Instructions (Signed)
[id number] - [place], [place] - [address] [place] [address] Phone: [phone] Fax: [phone]      Your procedure is scheduled on Thursday February 3rd.  Report to [facility] Main Entrance "A" at 7:00 A.M., and check in at the Admitting office.  Call this number if you have problems the morning of surgery:  [phone]  Call [phone] if you have any questions prior to your surgery date Monday-Friday 8am-4pm    Remember:  Do not eat after midnight the night before your surgery  You may drink clear liquids until 6:00 the morning of your surgery.   Clear liquids allowed are: Water, Non-Citrus Juices (without pulp), Carbonated Beverages, Clear Tea, Black Coffee Only, and Gatorade    Take these medicines the morning of surgery with A SIP OF WATER  cetirizine (Zyrtec) gabapentin (Neurontin) pantoprazole (Protonix) vanlafaxine (Effexor-XR) acetaminophen (Tylenol) if needed  As of today, STOP taking any Aspirin (unless otherwise instructed by your surgeon) Aleve, Naproxen, Ibuprofen, Motrin, Advil, Goody's, BC's, all herbal medications, fish oil, and all vitamins.   HOW TO MANAGE YOUR DIABETES BEFORE AND AFTER SURGERY  Why is it important to control my blood sugar before and after surgery? . Improving blood sugar levels before and after surgery helps healing and can limit problems. . A way of improving blood sugar control is eating a healthy diet by: o  Eating less sugar and carbohydrates o  Increasing activity/exercise o  Talking with your doctor about reaching your blood sugar goals . High blood sugars (greater than 180 mg/dL) can raise your risk of infections and slow your recovery, so you will need to focus on controlling your diabetes during the weeks before surgery. . Make sure that the doctor who takes care of your diabetes knows about your planned surgery including the date and  location.  How do I manage my blood sugar before surgery? . Check your blood sugar at least 4 times a day, starting 2 days before surgery, to make sure that the level is not too high or low. o Check your blood sugar the morning of your surgery when you wake up and every 2 hours until you get to the Short Stay unit. . If your blood sugar is less than 70 mg/dL, you will need to treat for low blood sugar: o Do not take insulin. o Treat a low blood sugar (less than 70 mg/dL) with  cup of clear juice (cranberry or apple), 4 glucose tablets, OR glucose gel. Recheck blood sugar in 15 minutes after treatment (to make sure it is greater than 70 mg/dL). If your blood sugar is not greater than 70 mg/dL on recheck, call [phone] o  for further instructions. . Report your blood sugar to the short stay nurse when you get to Short Stay.  . If you are admitted to the hospital after surgery: o Your blood sugar will be checked by the staff and you will probably be given insulin after surgery (instead of oral diabetes medicines) to make sure you have good blood sugar levels. o The goal for blood sugar control after surgery is 80-180 mg/dL.     WHAT DO I DO ABOUT MY DIABETES MEDICATION?   Marland Kitchen Do not take oral diabetes medicines (pills): metformin (Glucophage-XR)  the morning of surgery.  Do not wear jewelry, make up, or nail polish            Do not wear lotions, powders, perfumes/colognes, or deodorant.            Do not shave 48 hours prior to surgery.  Men may shave face and neck.            Do not bring valuables to the hospital.            [facility] is not responsible for any belongings or valuables.  Do NOT Smoke (Tobacco/Vaping) or drink Alcohol 24 hours prior to your procedure If you use a CPAP at night, you may bring all equipment for your overnight stay.   Contacts, glasses, dentures or bridgework may not be worn into surgery.      For patients admitted to the  hospital, discharge time will be determined by your treatment team.   Patients discharged the day of surgery will not be allowed to drive home, and someone needs to stay with them for 24 hours.    Special instructions:   [facility]- Preparing For Surgery  Before surgery, you can play an important role. Because skin is not sterile, your skin needs to be as free of germs as possible. You can reduce the number of germs on your skin by washing with CHG (chlorahexidine gluconate) Soap before surgery.  CHG is an antiseptic cleaner which kills germs and bonds with the skin to continue killing germs even after washing.    Oral Hygiene is also important to reduce your risk of infection.  Remember - BRUSH YOUR TEETH THE MORNING OF SURGERY WITH YOUR REGULAR TOOTHPASTE  Please do not use if you have an allergy to CHG or antibacterial soaps. If your skin becomes reddened/irritated stop using the CHG.  Do not shave (including legs and underarms) for at least 48 hours prior to first CHG shower. It is OK to shave your face.  Please follow these instructions carefully.   1. Shower the NIGHT BEFORE SURGERY and the MORNING OF SURGERY with CHG Soap.   2. If you chose to wash your hair, wash your hair first as usual with your normal shampoo.  3. After you shampoo, rinse your hair and body thoroughly to remove the shampoo.  4. Use CHG as you would any other liquid soap. You can apply CHG directly to the skin and wash gently with a scrungie or a clean washcloth.   5. Apply the CHG Soap to your body ONLY FROM THE NECK DOWN.  Do not use on open wounds or open sores. Avoid contact with your eyes, ears, mouth and genitals (private parts). Wash Face and genitals (private parts)  with your normal soap.   6. Wash thoroughly, paying special attention to the area where your surgery will be performed.  7. Thoroughly rinse your body with warm water from the neck down.  8. DO NOT shower/wash with your normal soap  after using and rinsing off the CHG Soap.  9. Pat yourself dry with a CLEAN TOWEL.  10. Wear CLEAN PAJAMAS to bed the night before surgery  11. Place CLEAN SHEETS on your bed the night of your first shower and DO NOT SLEEP WITH PETS.   Day of Surgery: Wear Clean/Comfortable clothing the morning of surgery Do not apply any deodorants/lotions.   Remember to brush your teeth WITH YOUR REGULAR TOOTHPASTE.   Please read over the following fact sheets that you were given.

## 2020-12-18 ENCOUNTER — Encounter (HOSPITAL_COMMUNITY)
Admission: RE | Admit: 2020-12-18 | Discharge: 2020-12-18 | Disposition: A | Discharge status: Home / Self Care | Payer: Medicare Other | Source: Ambulatory Visit | Attending: General Surgery | Admitting: General Surgery

## 2020-12-18 ENCOUNTER — Encounter (HOSPITAL_COMMUNITY): Payer: Self-pay

## 2020-12-18 ENCOUNTER — Other Ambulatory Visit: Payer: Self-pay

## 2020-12-18 ENCOUNTER — Encounter (HOSPITAL_COMMUNITY): Payer: Self-pay | Admitting: Physician Assistant

## 2020-12-18 DIAGNOSIS — I4819 Other persistent atrial fibrillation: Secondary | ICD-10-CM | POA: Diagnosis not present

## 2020-12-18 DIAGNOSIS — I10 Essential (primary) hypertension: Secondary | ICD-10-CM | POA: Diagnosis not present

## 2020-12-18 DIAGNOSIS — Z01818 Encounter for other preprocedural examination: Secondary | ICD-10-CM | POA: Diagnosis not present

## 2020-12-18 HISTORY — DX: Unspecified asthma, uncomplicated: J45.909

## 2020-12-18 HISTORY — DX: Essential (primary) hypertension: I10

## 2020-12-18 HISTORY — DX: Unspecified osteoarthritis, unspecified site: M19.90

## 2020-12-18 LAB — BASIC METABOLIC PANEL
Anion gap: 11 (ref 5–15)
BUN: 16 mg/dL (ref 8–23)
CO2: 28 mmol/L (ref 22–32)
Calcium: 9.3 mg/dL (ref 8.9–10.3)
Chloride: 100 mmol/L (ref 98–111)
Creatinine, Ser: 0.84 mg/dL (ref 0.44–1.00)
GFR, Estimated: >60 mL/min (ref >60)
Glucose, Bld: 136 mg/dL — ABNORMAL HIGH (ref 70–99)
Potassium: 3.9 mmol/L (ref 3.5–5.1)
Sodium: 139 mmol/L (ref 135–145)

## 2020-12-18 LAB — CBC
HCT: 34.7 % — ABNORMAL LOW (ref 36.0–46.0)
Hemoglobin: 10.8 g/dL — ABNORMAL LOW (ref 12.0–15.0)
MCH: 25.8 pg — ABNORMAL LOW (ref 26.0–34.0)
MCHC: 31.1 g/dL (ref 30.0–36.0)
MCV: 83 fL (ref 80.0–100.0)
Platelets: 278 10*3/uL (ref 150–400)
RBC: 4.18 MIL/uL (ref 3.87–5.11)
RDW: 17 % — ABNORMAL HIGH (ref 11.5–15.5)
WBC: 5.8 10*3/uL (ref 4.0–10.5)
nRBC: 0 % (ref 0.0–0.2)

## 2020-12-18 LAB — GLUCOSE, CAPILLARY: Glucose-Capillary: 188 mg/dL — ABNORMAL HIGH (ref 70–99)

## 2020-12-18 LAB — HEMOGLOBIN A1C
Hgb A1c MFr Bld: 6.8 % — ABNORMAL HIGH (ref 4.8–5.6)
Mean Plasma Glucose: 148.46 mg/dL

## 2020-12-18 MED ORDER — CHLORHEXIDINE GLUCONATE CLOTH 2 % EX PADS: 6.0000 | MEDICATED_PAD | Freq: Once | CUTANEOUS | Status: DC

## 2020-12-18 NOTE — Progress Notes (Signed)
PCP - C.JUDGE    IN [facility] Cardiologist - NA    Chest x-ray - NA EKG - 12/18/20 Stress Test - NA NA Cardiac Cath NA-     Fasting Blood Sugar - 120Checks Blood Sugar _PRN____ times a day  Blood Thinner Instructions:NA Aspirin Instructions:NA  ERAS Protcol -      INSTRUCTIONS GIVEN  COVID TEST- 12/18/20   Anesthesia review: HTN  Patient denies shortness of breath, fever, cough and chest pain at PAT appointment   All instructions explained to the patient, with a verbal understanding of the material. Patient agrees to go over the instructions while at home for a better understanding. Patient also instructed to self quarantine after being tested for COVID-19. The opportunity to ask questions was provided.

## 2020-12-18 NOTE — Progress Notes (Addendum)
Anesthesia Chart Review:  Preop EKG demonstrated new onset atrial fibrillation.  When I spoke to the patient she was asymptomatic.  She denied awareness of being in atrial fibrillation, denied palpitations.  She denied any previous history of atrial fibrillation.  She did report a strong family history of atrial fibrillation as well as CVA.  She reports mild increase in fatigue over the last several months.  Although, she states that she has a history of chronic fatigue related to her fibromyalgia.  She also admits to weight gain that she feels may be contributing.  She reports intermittent episodes of having a sensation of not being able to catch her breath.  The symptoms are nonexertional and are self resolving.  She denies chest pain.  She denies syncope.  She denies any other cardiopulmonary complaints.  On exam she is well-appearing, in no acute distress, auscultation reveals irregularly irregular rhythm, normal rate, lungs are CTAB.    Lab work reveals mild anemia with hemoglobin 10.8, DM2 well-controlled with A1c 6.8.  We discussed recommendation that new onset atrial fibrillation be evaluated by cardiology prior to surgery.  Patient understands and is in agreement.  I spoke with cardiology DOD who expedited patient appointment, she is scheduled to be seen 12/19/2020 at 9 AM by Dr. Johney Frame.  Patient understands that ability to proceed with surgery as scheduled is dependent upon cardiology evaluation.  At this point, no changes will be made to her seed placement scheduled for 2/2 at 1:45 PM or her surgery scheduled for 2/3.  I called and spoke with triage nurse in Dr. Marlowe Aschoff office to make her aware of patient's appointment with cardiology.   Wynonia Musty [facility] Short Stay Center/Anesthesiology Phone [phone] 12/18/2020 1:36 PM

## 2020-12-19 ENCOUNTER — Other Ambulatory Visit: Payer: Self-pay | Admitting: Cardiology

## 2020-12-19 ENCOUNTER — Ambulatory Visit (INDEPENDENT_AMBULATORY_CARE_PROVIDER_SITE_OTHER): Payer: Medicare Other | Admitting: Cardiology

## 2020-12-19 ENCOUNTER — Encounter: Payer: Self-pay | Admitting: Cardiology

## 2020-12-19 VITALS — BP 134/86 | HR 98 | Ht 62.0 in | Wt 206.0 lb

## 2020-12-19 DIAGNOSIS — I10 Essential (primary) hypertension: Secondary | ICD-10-CM | POA: Diagnosis not present

## 2020-12-19 DIAGNOSIS — R0600 Dyspnea, unspecified: Secondary | ICD-10-CM

## 2020-12-19 DIAGNOSIS — R0602 Shortness of breath: Secondary | ICD-10-CM

## 2020-12-19 DIAGNOSIS — E119 Type 2 diabetes mellitus without complications: Secondary | ICD-10-CM | POA: Diagnosis not present

## 2020-12-19 DIAGNOSIS — C50411 Malignant neoplasm of upper-outer quadrant of right female breast: Secondary | ICD-10-CM

## 2020-12-19 DIAGNOSIS — Z01810 Encounter for preprocedural cardiovascular examination: Secondary | ICD-10-CM

## 2020-12-19 DIAGNOSIS — R0609 Other forms of dyspnea: Secondary | ICD-10-CM

## 2020-12-19 DIAGNOSIS — I4891 Unspecified atrial fibrillation: Secondary | ICD-10-CM | POA: Diagnosis not present

## 2020-12-19 MED ORDER — APIXABAN 5 MG PO TABS: 5.0000 mg | ORAL_TABLET | Freq: Two times a day (BID) | ORAL | 11 refills | Status: DC

## 2020-12-19 NOTE — Patient Instructions (Signed)
Medication Instructions:  Your physician has recommended you make the following change in your medication:  1. START ELIQUIS 5 MG TWICE DAILY  *If you need a refill on your cardiac medications before your next appointment, please call your pharmacy*   Lab Work: NONE If you have labs (blood work) drawn today and your tests are completely normal, you will receive your results only by: Marland Kitchen MyChart Message (if you have MyChart) OR . A paper copy in the mail If you have any lab test that is abnormal or we need to change your treatment, we will call you to review the results.   Testing/Procedures: Your physician has requested that you have an echocardiogram. Echocardiography is a painless test that uses sound waves to create images of your heart. It provides your doctor with information about the size and shape of your heart and how well your heart's chambers and valves are working. This procedure takes approximately one hour. There are no restrictions for this procedure.  Your physician has requested that you have a lexiscan myoview. For further information please visit [email or web address]. Please follow instruction sheet, as given.   Follow-Up: At [facility], you and your health needs are our priority.  As part of our continuing mission to provide you with exceptional heart care, we have created designated Provider Care Teams.  These Care Teams include your primary Cardiologist (physician) and Advanced Practice Providers (APPs -  Physician Assistants and Nurse Practitioners) who all work together to provide you with the care you need, when you need it.  We recommend signing up for the patient portal called "MyChart".  Sign up information is provided on this After Visit Summary.  MyChart is used to connect with patients for Virtual Visits (Telemedicine).  Patients are able to view lab/test results, encounter notes, upcoming appointments, etc.  Non-urgent messages can be sent to your provider as  well.   To learn more about what you can do with MyChart, go to [email or web address].    Your next appointment:   3 month(s)  The format for your next appointment:   In Person  Provider:   You may see DR. PENBERTON or one of the following Advanced Practice Providers on your designated Care Team:    Richardson Dopp, PA-C  Vin [place], [place]    Other Instructions  Echocardiogram An echocardiogram is a test that uses sound waves (ultrasound) to produce images of the heart. Images from an echocardiogram can provide important information about:  Heart size and shape.  The size and thickness and movement of your heart's walls.  Heart muscle function and strength.  Heart valve function or if you have stenosis. Stenosis is when the heart valves are too narrow.  If blood is flowing backward through the heart valves (regurgitation).  A tumor or infectious growth around the heart valves.  Areas of heart muscle that are not working well because of poor blood flow or injury from a heart attack.  Aneurysm detection. An aneurysm is a weak or damaged part of an artery wall. The wall bulges out from the normal force of blood pumping through the body. Tell a health care provider about:  Any allergies you have.  All medicines you are taking, including vitamins, herbs, eye drops, creams, and over-the-counter medicines.  Any blood disorders you have.  Any surgeries you have had.  Any medical conditions you have.  Whether you are pregnant or may be pregnant. What are the risks? Generally, this is a safe test. However, problems  may occur, including an allergic reaction to dye (contrast) that may be used during the test. What happens before the test? No specific preparation is needed. You may eat and drink normally. What happens during the test?  You will take off your clothes from the waist up and put on a hospital gown.  Electrodes or electrocardiogram (ECG)patches may be placed  on your chest. The electrodes or patches are then connected to a device that monitors your heart rate and rhythm.  You will lie down on a table for an ultrasound exam. A gel will be applied to your chest to help sound waves pass through your skin.  A handheld device, called a transducer, will be pressed against your chest and moved over your heart. The transducer produces sound waves that travel to your heart and bounce back (or "echo" back) to the transducer. These sound waves will be captured in real-time and changed into images of your heart that can be viewed on a video monitor. The images will be recorded on a computer and reviewed by your health care provider.  You may be asked to change positions or hold your breath for a short time. This makes it easier to get different views or better views of your heart.  In some cases, you may receive contrast through an IV in one of your veins. This can improve the quality of the pictures from your heart. The procedure may vary among health care providers and hospitals.   What can I expect after the test? You may return to your normal, everyday life, including diet, activities, and medicines, unless your health care provider tells you not to do that. Follow these instructions at home:  It is up to you to get the results of your test. Ask your health care provider, or the department that is doing the test, when your results will be ready.  Keep all follow-up visits. This is important. Summary  An echocardiogram is a test that uses sound waves (ultrasound) to produce images of the heart.  Images from an echocardiogram can provide important information about the size and shape of your heart, heart muscle function, heart valve function, and other possible heart problems.  You do not need to do anything to prepare before this test. You may eat and drink normally.  After the echocardiogram is completed, you may return to your normal, everyday life,  unless your health care provider tells you not to do that. This information is not intended to replace advice given to you by your health care provider. Make sure you discuss any questions you have with your health care provider. Document Revised: 06/26/2020 Document Reviewed: 06/26/2020 Elsevier Patient Education  2021 [employer].

## 2020-12-19 NOTE — Progress Notes (Signed)
Cardiology Office Note:    Date:  12/19/2020   ID:  Kayla Bennett, DOB 08-Nov-1951, MRN [id number]  PCP:  [name], MD  [place] Cardiologist:  No primary care provider on file.  [facility] Electrophysiologist:  None   Referring MD: [name], MD    History of Present Illness:    Kayla Bennett is a 71 y.o. female with a hx of newly diagnosed right breast cancer, DMII, HTN, asthma, and fibromyalgia who was referred by Dr. Dory Larsen for new diagnosis of atrial fibrillation.  The patient was seen at a pre-operative visit prior to right sided lumpectomy and lymph node dissection (planned for tomorrow) where she was incidentally discovered to be in atrial fibrillation. She states that about 17months ago she developed symptoms of intermittent shortness of breath that she could not predict when they would come on. Otherwise no significant symptoms. No significant palpitations, chest pain, lightheadedness, dizziness, or syncope. Occasional nausea which is usually related to GERD. Has noted some chronic fatigue but this is common for her given history of fibromyalgia.   The patient admits that her exercise capacity is limited due to left knee pain and due to chronic fatigue. She is unable to walk a flight of stairs without stopping to catch her breath. She admits to being very sedentary due to chronic pain. She is working on doing seated exercises, but can get short winded with activity. No exertional chest pain, dizziness or palpitations.   Family history notable for father with massive MI at age 75, paternal GM with MI, Uncles with MI and history of CABG. Mother with MI, atrial fibrillation, pulmonary fibrosis/PH. Maternal aunt with atrial fibrillation. Cousin with atrial fibrillation. Maternal GM: CVA and CHF  Past Medical History:  Diagnosis Date  . Arthritis   . Asthma   . Diabetes ([place])   . Fibromyalgia   . GERD (gastroesophageal reflux disease)   .  Hypertension   . Osteopenia   . Psoriasis     Past Surgical History:  Procedure Laterality Date  . ABDOMINAL HYSTERECTOMY    . APPENDECTOMY    . broken wrist    . CESAREAN SECTION  1976 and 1977  . CHOLECYSTECTOMY    . HYSTERECTOMY ABDOMINAL WITH SALPINGO-OOPHORECTOMY    . LYSIS OF ADHESION    . SHOULDER SURGERY Left     Current Medications: Current Meds  Medication Sig  . acetaminophen (TYLENOL) 500 MG tablet Take 500 mg by mouth every 8 (eight) hours as needed for moderate pain.  . Alirocumab (PRALUENT) 150 MG/ML SOAJ Inject 150 mg into the skin every 14 (fourteen) days.  Marland Kitchen apixaban (ELIQUIS) 5 MG TABS tablet Take 1 tablet (5 mg total) by mouth 2 (two) times daily.  Marland Kitchen aspirin EC 81 MG tablet Take 81 mg by mouth daily. Swallow whole.  . cetirizine (ZYRTEC) 10 MG tablet Take 10 mg by mouth daily.  . cholecalciferol (VITAMIN D3) 25 MCG (1000 UNIT) tablet Take 2,000 Units by mouth daily.  Marland Kitchen gabapentin (NEURONTIN) 100 MG capsule Take 200 mg by mouth at bedtime.  Marland Kitchen ibuprofen (ADVIL) 200 MG tablet Take 400 mg by mouth every 8 (eight) hours as needed for moderate pain.  Marland Kitchen losartan-hydrochlorothiazide (HYZAAR) 100-25 MG tablet Take 1 tablet by mouth daily.  . metFORMIN (GLUCOPHAGE-XR) 500 MG 24 hr tablet Take 500 mg by mouth at bedtime.  . pantoprazole (PROTONIX) 40 MG tablet Take 40 mg by mouth 2 (two) times daily before a meal.  .  Prenatal Vit-Fe Fumarate-FA (PRENATAL VITAMIN PLUS LOW IRON PO) Take by mouth.  . SUMAtriptan (IMITREX) 50 MG tablet Take by mouth.  . venlafaxine XR (EFFEXOR-XR) 75 MG 24 hr capsule Take 75 mg by mouth daily.     Allergies:   Lotensin [benazepril hcl], Morphine, Erythromycin base, Hydrocodone, and Wound dressing adhesive   Social History   Socioeconomic History  . Marital status: Married    Spouse name: Not on file  . Number of children: 2  . Years of education: Not on file  . Highest education level: Not on file  Occupational History  . Not on  file  Tobacco Use  . Smoking status: Never Smoker  . Smokeless tobacco: Never Used  Vaping Use  . Vaping Use: Never used  Substance and Sexual Activity  . Alcohol use: Never  . Drug use: Never  . Sexual activity: Not on file  Other Topics Concern  . Not on file  Social History Narrative  . Not on file   Social Determinants of Health   Financial Resource Strain: Not on file  Food Insecurity: Not on file  Transportation Needs: Not on file  Physical Activity: Not on file  Stress: Not on file  Social Connections: Not on file     Family History: The patient's family history includes Cancer in her maternal uncle; Colon cancer in her cousin and paternal uncle; Colon cancer (age of onset: 72) in her father.  ROS:   Please see the history of present illness.    Review of Systems  Constitutional: Positive for malaise/fatigue. Negative for chills and fever.  HENT: Negative for congestion.   Eyes: Negative for blurred vision.  Respiratory: Positive for shortness of breath.   Cardiovascular: Negative for chest pain, palpitations, orthopnea, claudication, leg swelling and PND.  Gastrointestinal: Positive for nausea. Negative for blood in stool and melena.  Genitourinary: Negative for hematuria.  Musculoskeletal: Positive for back pain, joint pain and myalgias.  Neurological: Negative for dizziness and loss of consciousness.  Endo/Heme/Allergies: Negative for polydipsia.  Psychiatric/Behavioral: Negative for substance abuse.    EKGs/Labs/Other Studies Reviewed:    The following studies were reviewed today: No cardiac studies  EKG:  EKG 12/18/20: Rate controlled Afib with HR 69  Recent Labs: 12/05/2020: ALT 32 12/18/2020: BUN 16; Creatinine, Ser 0.84; Hemoglobin 10.8; Platelets 278; Potassium 3.9; Sodium 139  Recent Lipid Panel No results found for: CHOL, TRIG, HDL, CHOLHDL, VLDL, LDLCALC, LDLDIRECT   Risk Assessment/Calculations:    CHA2DS2-VASc Score = 4  This indicates a  4.8% annual risk of stroke. The patient's score is based upon: CHF History: No HTN History: Yes Diabetes History: Yes Stroke History: No Vascular Disease History: No Age Score: 1 Gender Score: 1     Physical Exam:    VS:  BP 134/86   Pulse 98   Ht 5\' 2"  (1.575 m)   Wt 206 lb (93.4 kg)   SpO2 94%   BMI 37.68 kg/m     Wt Readings from Last 3 Encounters:  12/19/20 206 lb (93.4 kg)  12/18/20 206 lb 3.2 oz (93.5 kg)     GEN:  Well nourished, well developed in no acute distress HEENT: Normal NECK: No JVD; No carotid bruits LYMPHATICS: No lymphadenopathy CARDIAC: Irregular, no murmurs, rubs, gallops RESPIRATORY:  Clear to auscultation without rales, wheezing or rhonchi  ABDOMEN: Soft, non-tender, non-distended MUSCULOSKELETAL:  No edema; No deformity  SKIN: Warm and dry NEUROLOGIC:  Alert and oriented x 3 PSYCHIATRIC:  Normal affect  ASSESSMENT:    1. Atrial fibrillation, unspecified type ([place])   2. SOB (shortness of breath) on exertion   3. Pre-operative cardiovascular examination   4. Primary hypertension   5. Type 2 diabetes mellitus without complication, without long-term current use of insulin ([place])   6. Malignant neoplasm of upper-outer quadrant of right female breast, unspecified estrogen receptor status ([place])    PLAN:    In order of problems listed above:  #Pre-operative Evaluation: Patient is planned for right breast lumpectomy with node dissection tomorrow. Unfortunately she is unable to perform 4METs of activity due to arthritis, fatigue and SOB and merits stress testing prior to surgery due to multiple risk factors for CAD including HTN, DMII, HLD. Revised cardiac risk index 3.9% for 30 day risk MI, death or cardiac arrest. Plan for lexiscan and if low or intermediate risk, no further testing needed prior to proceeding with surgery. -Plan for lexiscan for evaluation for ischemic disease -If lexiscan low or intermediate risk, okay to go for surgery -Okay  to hold [facility] prior to surgery and resume once able  #Newly Diagnosed Atrial Fibrillation: Incidentally detected on pre-operative ECG. Patient is relatively asymptomatic with no palpitations, SOB, lightheadedness or chest pain. Has chronic fatigue at baseline which she attributes to fibromyalgia and occasional episodes of SOB. CHADS-vasc 4. HR currently well controlled in the 60s. -Start apixaban 5mg  BID; okay to hold without bridging prior to surgery -Stop ASA given need for AC -Obtain TTE  #HTN: -Continue losartan-HCTZ 100-25mg  daily  #DMII:  Managed by PCP. A1C 6.6. -Continue metformin  #Newly diagnosed right breast cancer: Followed by Dr. Isidore Moos with Oncology. -Planned for right breast lumpectomy and lymph node resection  #HLD: Total 137, LDL 47, HDL 70. -On praulent   #Fibromyalgia: #Chronic Pain: -Management per PCP  Shared Decision Making/Informed Consent The risks [chest pain, shortness of breath, cardiac arrhythmias, dizziness, blood pressure fluctuations, myocardial infarction, stroke/transient ischemic attack, nausea, vomiting, allergic reaction, radiation exposure, metallic taste sensation and life-threatening complications (estimated to be 1 in 10,000)], benefits (risk stratification, diagnosing coronary artery disease, treatment guidance) and alternatives of a nuclear stress test were discussed in detail with Kayla Bennett and she agrees to proceed.    Medication Adjustments/Labs and Tests Ordered: Current medicines are reviewed at length with the patient today.  Concerns regarding medicines are outlined above.  Orders Placed This Encounter  Procedures  . MYOCARDIAL PERFUSION IMAGING  . ECHOCARDIOGRAM COMPLETE   Meds ordered this encounter  Medications  . apixaban (ELIQUIS) 5 MG TABS tablet    Sig: Take 1 tablet (5 mg total) by mouth 2 (two) times daily.    Dispense:  60 tablet    Refill:  11    Patient Instructions   Medication Instructions:  Your physician  has recommended you make the following change in your medication:  1. START ELIQUIS 5 MG TWICE DAILY  *If you need a refill on your cardiac medications before your next appointment, please call your pharmacy*   Lab Work: NONE If you have labs (blood work) drawn today and your tests are completely normal, you will receive your results only by: Marland Kitchen MyChart Message (if you have MyChart) OR . A paper copy in the mail If you have any lab test that is abnormal or we need to change your treatment, we will call you to review the results.   Testing/Procedures: Your physician has requested that you have an echocardiogram. Echocardiography is a painless test that uses sound waves to create images  of your heart. It provides your doctor with information about the size and shape of your heart and how well your heart's chambers and valves are working. This procedure takes approximately one hour. There are no restrictions for this procedure.  Your physician has requested that you have a lexiscan myoview. For further information please visit [email or web address]. Please follow instruction sheet, as given.   Follow-Up: At [facility], you and your health needs are our priority.  As part of our continuing mission to provide you with exceptional heart care, we have created designated Provider Care Teams.  These Care Teams include your primary Cardiologist (physician) and Advanced Practice Providers (APPs -  Physician Assistants and Nurse Practitioners) who all work together to provide you with the care you need, when you need it.  We recommend signing up for the patient portal called "MyChart".  Sign up information is provided on this After Visit Summary.  MyChart is used to connect with patients for Virtual Visits (Telemedicine).  Patients are able to view lab/test results, encounter notes, upcoming appointments, etc.  Non-urgent messages can be sent to your provider as well.   To learn more about what you can  do with MyChart, go to [email or web address].    Your next appointment:   3 month(s)  The format for your next appointment:   In Person  Provider:   You may see DR. PENBERTON or one of the following Advanced Practice Providers on your designated Care Team:    Richardson Dopp, PA-C  Vin Strayhorn, [place]    Other Instructions  Echocardiogram An echocardiogram is a test that uses sound waves (ultrasound) to produce images of the heart. Images from an echocardiogram can provide important information about:  Heart size and shape.  The size and thickness and movement of your heart's walls.  Heart muscle function and strength.  Heart valve function or if you have stenosis. Stenosis is when the heart valves are too narrow.  If blood is flowing backward through the heart valves (regurgitation).  A tumor or infectious growth around the heart valves.  Areas of heart muscle that are not working well because of poor blood flow or injury from a heart attack.  Aneurysm detection. An aneurysm is a weak or damaged part of an artery wall. The wall bulges out from the normal force of blood pumping through the body. Tell a health care provider about:  Any allergies you have.  All medicines you are taking, including vitamins, herbs, eye drops, creams, and over-the-counter medicines.  Any blood disorders you have.  Any surgeries you have had.  Any medical conditions you have.  Whether you are pregnant or may be pregnant. What are the risks? Generally, this is a safe test. However, problems may occur, including an allergic reaction to dye (contrast) that may be used during the test. What happens before the test? No specific preparation is needed. You may eat and drink normally. What happens during the test?  You will take off your clothes from the waist up and put on a hospital gown.  Electrodes or electrocardiogram (ECG)patches may be placed on your chest. The electrodes or patches  are then connected to a device that monitors your heart rate and rhythm.  You will lie down on a table for an ultrasound exam. A gel will be applied to your chest to help sound waves pass through your skin.  A handheld device, called a transducer, will be pressed against your chest and moved over your heart. The  transducer produces sound waves that travel to your heart and bounce back (or "echo" back) to the transducer. These sound waves will be captured in real-time and changed into images of your heart that can be viewed on a video monitor. The images will be recorded on a computer and reviewed by your health care provider.  You may be asked to change positions or hold your breath for a short time. This makes it easier to get different views or better views of your heart.  In some cases, you may receive contrast through an IV in one of your veins. This can improve the quality of the pictures from your heart. The procedure may vary among health care providers and hospitals.   What can I expect after the test? You may return to your normal, everyday life, including diet, activities, and medicines, unless your health care provider tells you not to do that. Follow these instructions at home:  It is up to you to get the results of your test. Ask your health care provider, or the department that is doing the test, when your results will be ready.  Keep all follow-up visits. This is important. Summary  An echocardiogram is a test that uses sound waves (ultrasound) to produce images of the heart.  Images from an echocardiogram can provide important information about the size and shape of your heart, heart muscle function, heart valve function, and other possible heart problems.  You do not need to do anything to prepare before this test. You may eat and drink normally.  After the echocardiogram is completed, you may return to your normal, everyday life, unless your health care provider tells you  not to do that. This information is not intended to replace advice given to you by your health care provider. Make sure you discuss any questions you have with your health care provider. Document Revised: 06/26/2020 Document Reviewed: 06/26/2020 Elsevier Patient Education  2021 [place].      Signed, Freada Bergeron, MD  12/19/2020 10:34 AM    [place]

## 2020-12-19 NOTE — H&P (Signed)
Kayla Bennett Appointment: 12/05/2020 1:00 PM Location: [facility] Patient #: [id number] DOB: 06-10-1951 Undefined / Language: Kayla Bennett / Race: White Female   History of Present Illness Kayla Klein MD; 12/05/2020 2:49 PM) The patient is a 70 year old female who presents with breast cancer.Pt is a lovely 70 yo F who is referred by Dr. Luan Bennett for a new diagnosis of right breast cancer 11/2020. She presented with a screening detected mass. Diagnostic imaging showed a 9 mm mass in the upper outer quadrant. Axillary u/s negative for lymphadenopathy. 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   She has no family breast cancer history, but her mother had melanoma. She has no prior cancers before this. She is a G2P2 with first child in the early 73s. She had hysterectomy in the early 58s.     diagnostic imaging from [place], images and reports reviewed today in multidisciplinary fashion. Breast density is B  pathology 11/22/2020 Breast, right, needle core biopsy, 10 cmfn, upper outer quadrant, post depth - INVASIVE DUCTAL CARCINOMA. Carcinoma measures 0.7 cm in greatest linear dimension and appears grade 2. The tumor cells are NEGATIVE for Her2 (1+). Estrogen Receptor: 95%, POSITIVE, STRONG STAINING INTENSITY Progesterone Receptor: 95%, POSITIVE, STRONG STAINING INTENSITY Proliferation Marker Ki67: 10%  Labs 12/05/2020 CMET essentially normal other than mildly elevated AST at 42 (ULN 41), CBC wtih HCT 34.9.    Past Surgical History Kayla Slipper, RN; 12/05/2020 8:09 AM) Appendectomy  Breast Biopsy  Right. Cesarean Section - Multiple  Gallbladder Surgery - Laparoscopic  Hysterectomy (not due to cancer) - Complete  Oral Surgery  Shoulder Surgery  Bilateral.  Diagnostic Studies History Kayla Slipper, RN; 12/05/2020 8:09 AM) Colonoscopy  1-5 years ago Mammogram  within last year Pap Smear  1-5  years ago  Medication History Kayla Slipper, RN; 12/05/2020 8:09 AM) Medications Reconciled  Social History Kayla Slipper, RN; 12/05/2020 8:09 AM) Caffeine use  Coffee. No alcohol use  No drug use  Tobacco use  Never smoker.  Family History Kayla Slipper, RN; 12/05/2020 8:09 AM) Anesthetic complications  Mother. Arthritis  Mother. Cancer  Mother. Colon Cancer  Father. Diabetes Mellitus  Mother, Sister. Heart Disease  Father, Mother. Heart disease in female family member before age 34  Heart disease in female family member before age 68  Hypertension  Brother, Father, Mother, Sister. Melanoma  Mother. Respiratory Condition  Mother.  Pregnancy / Birth History Kayla Slipper, RN; 12/05/2020 8:09 AM) Age at menarche  18 years. Age of menopause  <45 Contraceptive History  Oral contraceptives. Gravida  2 Irregular periods  Length (months) of breastfeeding  3-6 Maternal age  43-25 Para  2  Other Problems Kayla Slipper, RN; 12/05/2020 8:09 AM) Asthma  Back Pain  Cholelithiasis  Diabetes Mellitus  Gastroesophageal Reflux Disease  High blood pressure  Hypercholesterolemia  Melanoma  Migraine Headache  Transfusion history  Ventral Hernia Repair     Review of Systems Kayla Slipper RN; 12/05/2020 8:09 AM) General Not Present- Appetite Loss, Chills, Fatigue, Fever, Night Sweats, Weight Gain and Weight Loss. Skin Present- Dryness. Not Present- Change in Wart/Mole, Hives, Jaundice, New Lesions, Non-Healing Wounds, Rash and Ulcer. HEENT Present- Hoarseness, Seasonal Allergies and Wears glasses/contact lenses. Not Present- Earache, Hearing Loss, Nose Bleed, Oral Ulcers, Ringing in the Ears, Sinus Pain, Sore Throat, Visual Disturbances and Yellow Eyes. Respiratory Present- Snoring. Not Present- Bloody sputum, Chronic Cough, Difficulty Breathing and Wheezing. Breast Not  Present- Breast Mass, Breast Pain, Nipple Discharge and Skin Changes. Cardiovascular Present-  Shortness of Breath. Not Present- Chest Pain, Difficulty Breathing Lying Down, Leg Cramps, Palpitations, Rapid Heart Rate and Swelling of Extremities. Gastrointestinal Present- Change in Bowel Habits, Chronic diarrhea and Indigestion. Not Present- Abdominal Pain, Bloating, Bloody Stool, Constipation, Difficulty Swallowing, Excessive gas, Gets full quickly at meals, Hemorrhoids, Nausea, Rectal Pain and Vomiting. Female Genitourinary Not Present- Frequency, Nocturia, Painful Urination, Pelvic Pain and Urgency. Musculoskeletal Present- Joint Pain, Joint Stiffness and Muscle Pain. Not Present- Back Pain, Muscle Weakness and Swelling of Extremities. Neurological Present- Headaches and Trouble walking. Not Present- Decreased Memory, Fainting, Numbness, Seizures, Tingling, Tremor and Weakness. Psychiatric Not Present- Anxiety, Bipolar, Change in Sleep Pattern, Depression, Fearful and Frequent crying. Endocrine Present- Heat Intolerance and Hot flashes. Not Present- Cold Intolerance, Excessive Hunger, Hair Changes and New Diabetes. Hematology Present- Blood Thinners. Not Present- Easy Bruising, Excessive bleeding, Gland problems, HIV and Persistent Infections.   Physical Exam Kayla Klein MD; 12/05/2020 2:48 PM) General Mental Status-Alert. General Appearance-Consistent with stated age. Hydration-Well hydrated. Voice-Normal.  Head and Neck Head-normocephalic, atraumatic with no lesions or palpable masses. Trachea-midline. Thyroid Gland Characteristics - normal size and consistency.  Eye Eyeball - Bilateral-Extraocular movements intact. Sclera/Conjunctiva - Bilateral-No scleral icterus.  Chest and Lung Exam Chest and lung exam reveals -quiet, even and easy respiratory effort with no use of accessory muscles and on auscultation, normal breath sounds, no adventitious sounds and normal vocal resonance. Inspection Chest Wall - Normal. Back - normal.  Breast Note: breasts are  relatively symmetric. No palpable masses are present. No LAD. no nipple retraction or nipple discharge. no skin dimpling. some ptosis present. tender at right biopsy site.   Cardiovascular Cardiovascular examination reveals -normal heart sounds, regular rate and rhythm with no murmurs and normal pedal pulses bilaterally.  Abdomen Inspection Inspection of the abdomen reveals - No Hernias. Palpation/Percussion Palpation and Percussion of the abdomen reveal - Soft, Non Tender, No Rebound tenderness, No Rigidity (guarding) and No hepatosplenomegaly. Auscultation Auscultation of the abdomen reveals - Bowel sounds normal.  Neurologic Neurologic evaluation reveals -alert and oriented x 3 with no impairment of recent or remote memory. Mental Status-Normal.  Musculoskeletal Global Assessment -Note: no gross deformities, but left shoulder has very limited mobility due to prior injury.  Normal Exam - Left-Upper Extremity Strength Normal and Lower Extremity Strength Normal. Normal Exam - Right-Upper Extremity Strength Normal and Lower Extremity Strength Normal.  Lymphatic Head & Neck  General Head & Neck Lymphatics: Bilateral - Description - Normal. Axillary  General Axillary Region: Bilateral - Description - Normal. Tenderness - Non Tender. Femoral & Inguinal  Generalized Femoral & Inguinal Lymphatics: Bilateral - Description - No Generalized lymphadenopathy.    Assessment & Plan Kayla Klein MD; 12/05/2020 2:48 PM) MALIGNANT NEOPLASM OF UPPER-OUTER QUADRANT OF RIGHT BREAST IN FEMALE, ESTROGEN RECEPTOR POSITIVE (C50.411) Impression: Pt has a new diagnosis of cT1bN0 right breast cancer. I discussed breast conservation vs mastectomy with patient as well as rationale for sentinel lymph node biopsy. We will plan breast conservation.  This would be followed by radiation if breast conservation. Also, patient would have antiestrogen treatment. Oncotype might be sent depending on  final size of tumor.  The surgical procedure was described to the patient. I discussed the incision type and location and that we would need radiology involved on with a wire or seed marker and/or sentinel node.  The risks and benefits of the procedure were described to the patient and she wishes  to proceed.  We discussed the risks bleeding, infection, damage to other structures, need for further procedures/surgeries. We discussed the risk of seroma. The patient was advised if the area in the breast in cancer, we may need to go back to surgery for additional tissue to obtain negative margins or for a lymph node biopsy. The patient was advised that these are the most common complications, but that others can occur as well. They were advised against taking aspirin or other anti-inflammatory agents/blood thinners the week before surgery. Current Plans You are being scheduled for surgery- Our schedulers will call you.  You should hear from our office's scheduling department within 5 working days about the location, date, and time of surgery. We try to make accommodations for patient's preferences in scheduling surgery, but sometimes the OR schedule or the surgeon's schedule prevents [place] from making those accommodations.  If you have not heard from our office [phone]) in 5 working days, call the office and ask for your surgeon's nurse.  If you have other questions about your diagnosis, plan, or surgery, call the office and ask for your surgeon's nurse.  Pt Education - flb breast cancer surgery: discussed with patient and provided information.   Signed electronically by Kayla Klein, MD (12/05/2020 2:49 PM)

## 2020-12-20 ENCOUNTER — Encounter (HOSPITAL_COMMUNITY): Admission: RE | Payer: Self-pay | Source: Home / Self Care

## 2020-12-20 ENCOUNTER — Encounter (HOSPITAL_COMMUNITY): Payer: Medicare Other

## 2020-12-20 ENCOUNTER — Ambulatory Visit (HOSPITAL_COMMUNITY): Admission: RE | Admit: 2020-12-20 | Payer: Medicare Other | Source: Home / Self Care | Admitting: General Surgery

## 2020-12-20 SURGERY — BREAST LUMPECTOMY WITH RADIOACTIVE SEED AND SENTINEL LYMPH NODE BIOPSY
Anesthesia: General | Site: Breast | Laterality: Right

## 2020-12-21 ENCOUNTER — Other Ambulatory Visit: Payer: Self-pay

## 2020-12-21 ENCOUNTER — Ambulatory Visit (HOSPITAL_COMMUNITY): Payer: Medicare Other | Attending: Cardiology

## 2020-12-21 DIAGNOSIS — I4891 Unspecified atrial fibrillation: Secondary | ICD-10-CM

## 2020-12-21 LAB — ECHOCARDIOGRAM COMPLETE
Area-P 1/2: 4.5 cm2
MV M vel: 5.15 m/s
MV Peak grad: 106.1 mmHg
S' Lateral: 2.2 cm

## 2020-12-24 ENCOUNTER — Telehealth (HOSPITAL_COMMUNITY): Payer: Self-pay | Admitting: *Deleted

## 2020-12-24 NOTE — Telephone Encounter (Signed)
Left message on voicemail in reference to upcoming appointment scheduled for 12/27/20. Phone number given for a call back so details instructions can be given.  Kayla Bennett

## 2020-12-26 ENCOUNTER — Encounter: Payer: Self-pay | Admitting: *Deleted

## 2020-12-26 ENCOUNTER — Encounter: Payer: Self-pay | Admitting: Genetic Counselor

## 2020-12-26 DIAGNOSIS — Z1379 Encounter for other screening for genetic and chromosomal anomalies: Secondary | ICD-10-CM | POA: Insufficient documentation

## 2020-12-27 ENCOUNTER — Other Ambulatory Visit: Payer: Self-pay

## 2020-12-27 ENCOUNTER — Telehealth: Payer: Self-pay | Admitting: Cardiology

## 2020-12-27 ENCOUNTER — Ambulatory Visit (HOSPITAL_COMMUNITY): Payer: Medicare Other | Attending: Cardiology

## 2020-12-27 DIAGNOSIS — R0602 Shortness of breath: Secondary | ICD-10-CM

## 2020-12-27 LAB — MYOCARDIAL PERFUSION IMAGING
LV dias vol: 52 mL (ref 46–106)
LV sys vol: 14 mL
Peak HR: 94 {beats}/min
Rest HR: 75 {beats}/min
SDS: 0
SRS: 6
SSS: 6
TID: 0.96

## 2020-12-27 MED ORDER — REGADENOSON 0.4 MG/5ML IV SOLN
0.4000 mg | Freq: Once | INTRAVENOUS | Status: AC
Start: 2020-12-27 — End: 2020-12-27
  Administered 2020-12-27: 0.4 mg via INTRAVENOUS

## 2020-12-27 MED ORDER — TECHNETIUM TC 99M TETROFOSMIN IV KIT
10.1000 | PACK | Freq: Once | INTRAVENOUS | Status: AC | PRN
  Administered 2020-12-27: 10.1 via INTRAVENOUS
  Filled 2020-12-27: qty 11

## 2020-12-27 MED ORDER — TECHNETIUM TC 99M TETROFOSMIN IV KIT
31.1000 | PACK | Freq: Once | INTRAVENOUS | Status: AC | PRN
  Administered 2020-12-27: 31.1 via INTRAVENOUS
  Filled 2020-12-27: qty 32

## 2020-12-27 NOTE — Telephone Encounter (Signed)
Patient's stress test is normal without evidence of ischemia. Her pumping function was normal. She is cleared from a Cardiology standpoint to undergo her surgery. Okay to hold apixaban prior to the procedure and resume once cleared from surgical standpoint.  Gwyndolyn Kaufman, MD

## 2020-12-31 ENCOUNTER — Encounter: Payer: Self-pay | Admitting: *Deleted

## 2021-01-10 ENCOUNTER — Encounter (HOSPITAL_BASED_OUTPATIENT_CLINIC_OR_DEPARTMENT_OTHER): Payer: Self-pay | Admitting: General Surgery

## 2021-01-10 ENCOUNTER — Other Ambulatory Visit: Payer: Self-pay

## 2021-01-10 NOTE — Progress Notes (Signed)
Called and spoke with Hassan Rowan at Dr. Chuck Hint office. Will need hold time for Elquis. Already have cardiac clearance in epic. She will get back to me on how many days to hold elquis.

## 2021-01-11 NOTE — H&P (Signed)
Kayla Bennett Location: [facility] Patient #: [id number] DOB: 08/16/1951 Undefined / Language: English / Race: White Female   History of Present Illness  The patient is a 69 year old female who presents with breast cancer.Pt is a lovely 69 yo F who is referred by Dr. Hawkins for a new diagnosis of right breast cancer 11/2020. She presented with a screening detected mass. Diagnostic imaging showed a 9 mm mass in the upper outer quadrant. Axillary u/s negative for lymphadenopathy. 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   She has no family breast cancer history, but her mother had melanoma. She has no prior cancers before this. She is a G2P2 with first child in the early 20s. She had hysterectomy in the early 40s.     diagnostic imaging from [facility], images and reports reviewed today in multidisciplinary fashion. Breast density is B  pathology 11/22/2020 Breast, right, needle core biopsy, 10 cmfn, upper outer quadrant, post depth - INVASIVE DUCTAL CARCINOMA. Carcinoma measures 0.7 cm in greatest linear dimension and appears grade 2. The tumor cells are NEGATIVE for Her2 (1+). Estrogen Receptor: 95%, POSITIVE, STRONG STAINING INTENSITY Progesterone Receptor: 95%, POSITIVE, STRONG STAINING INTENSITY Proliferation Marker Ki67: 10%  Labs 12/05/2020 CMET essentially normal other than mildly elevated AST at 42 (ULN 41), CBC wtih HCT 34.9.    Past Surgical History Appendectomy  Breast Biopsy  Right. Cesarean Section - Multiple  Gallbladder Surgery - Laparoscopic  Hysterectomy (not due to cancer) - Complete  Oral Surgery  Shoulder Surgery  Bilateral.  Diagnostic Studies History  Colonoscopy  1-5 years ago Mammogram  within last year Pap Smear  1-5 years ago  Medication History  Medications Reconciled  Social History Caffeine use  Coffee. No alcohol use  No drug use   Tobacco use  Never smoker.  Family History Anesthetic complications  Mother. Arthritis  Mother. Cancer  Mother. Colon Cancer  Father. Diabetes Mellitus  Mother, Sister. Heart Disease  Father, Mother. Heart disease in female family member before age 65  Heart disease in female family member before age 55  Hypertension  Brother, Father, Mother, Sister. Melanoma  Mother. Respiratory Condition  Mother.  Pregnancy / Birth History  Age at menarche  10 years. Age of menopause  <45 Contraceptive History  Oral contraceptives. Gravida  2 Irregular periods  Length (months) of breastfeeding  3-6 Maternal age  21-25 Para  2  Other Problems Asthma  Back Pain  Cholelithiasis  Diabetes Mellitus  Gastroesophageal Reflux Disease  High blood pressure  Hypercholesterolemia  Melanoma  Migraine Headache  Transfusion history  Ventral Hernia Repair     Review of Systems  General Not Present- Appetite Loss, Chills, Fatigue, Fever, Night Sweats, Weight Gain and Weight Loss. Skin Present- Dryness. Not Present- Change in Wart/Mole, Hives, Jaundice, New Lesions, Non-Healing Wounds, Rash and Ulcer. HEENT Present- Hoarseness, Seasonal Allergies and Wears glasses/contact lenses. Not Present- Earache, Hearing Loss, Nose Bleed, Oral Ulcers, Ringing in the Ears, Sinus Pain, Sore Throat, Visual Disturbances and Yellow Eyes. Respiratory Present- Snoring. Not Present- Bloody sputum, Chronic Cough, Difficulty Breathing and Wheezing. Breast Not Present- Breast Mass, Breast Pain, Nipple Discharge and Skin Changes. Cardiovascular Present- Shortness of Breath. Not Present- Chest Pain, Difficulty Breathing Lying Down, Leg Cramps, Palpitations, Rapid Heart Rate and Swelling of Extremities. Gastrointestinal Present- Change in Bowel Habits, Chronic diarrhea and Indigestion. Not Present- Abdominal Pain, Bloating, Bloody Stool, Constipation, Difficulty Swallowing,   Excessive gas, Gets  full quickly at meals, Hemorrhoids, Nausea, Rectal Pain and Vomiting. Female Genitourinary Not Present- Frequency, Nocturia, Painful Urination, Pelvic Pain and Urgency. Musculoskeletal Present- Joint Pain, Joint Stiffness and Muscle Pain. Not Present- Back Pain, Muscle Weakness and Swelling of Extremities. Neurological Present- Headaches and Trouble walking. Not Present- Decreased Memory, Fainting, Numbness, Seizures, Tingling, Tremor and Weakness. Psychiatric Not Present- Anxiety, Bipolar, Change in Sleep Pattern, Depression, Fearful and Frequent crying. Endocrine Present- Heat Intolerance and Hot flashes. Not Present- Cold Intolerance, Excessive Hunger, Hair Changes and New Diabetes. Hematology Present- Blood Thinners. Not Present- Easy Bruising, Excessive bleeding, Gland problems, HIV and Persistent Infections.   Physical Exam  General Mental Status-Alert. General Appearance-Consistent with stated age. Hydration-Well hydrated. Voice-Normal.  Head and Neck Head-normocephalic, atraumatic with no lesions or palpable masses. Trachea-midline. Thyroid Gland Characteristics - normal size and consistency.  Eye Eyeball - Bilateral-Extraocular movements intact. Sclera/Conjunctiva - Bilateral-No scleral icterus.  Chest and Lung Exam Chest and lung exam reveals -quiet, even and easy respiratory effort with no use of accessory muscles and on auscultation, normal breath sounds, no adventitious sounds and normal vocal resonance. Inspection Chest Wall - Normal. Back - normal.  Breast Note: breasts are relatively symmetric. No palpable masses are present. No LAD. no nipple retraction or nipple discharge. no skin dimpling. some ptosis present. tender at right biopsy site.   Cardiovascular Cardiovascular examination reveals -normal heart sounds, regular rate and rhythm with no murmurs and normal pedal pulses bilaterally.  Abdomen Inspection Inspection of the abdomen  reveals - No Hernias. Palpation/Percussion Palpation and Percussion of the abdomen reveal - Soft, Non Tender, No Rebound tenderness, No Rigidity (guarding) and No hepatosplenomegaly. Auscultation Auscultation of the abdomen reveals - Bowel sounds normal.  Neurologic Neurologic evaluation reveals -alert and oriented x 3 with no impairment of recent or remote memory. Mental Status-Normal.  Musculoskeletal Global Assessment -Note: no gross deformities, but left shoulder has very limited mobility due to prior injury.  Normal Exam - Left-Upper Extremity Strength Normal and Lower Extremity Strength Normal. Normal Exam - Right-Upper Extremity Strength Normal and Lower Extremity Strength Normal.  Lymphatic Head & Neck  General Head & Neck Lymphatics: Bilateral - Description - Normal. Axillary  General Axillary Region: Bilateral - Description - Normal. Tenderness - Non Tender. Femoral & Inguinal  Generalized Femoral & Inguinal Lymphatics: Bilateral - Description - No Generalized lymphadenopathy.    Assessment & Plan MALIGNANT NEOPLASM OF UPPER-OUTER QUADRANT OF RIGHT BREAST IN FEMALE, ESTROGEN RECEPTOR POSITIVE (C50.411) Impression: Pt has a new diagnosis of cT1bN0 right breast cancer. I discussed breast conservation vs mastectomy with patient as well as rationale for sentinel lymph node biopsy. We will plan breast conservation.  This would be followed by radiation if breast conservation. Also, patient would have antiestrogen treatment. Oncotype might be sent depending on final size of tumor.  The surgical procedure was described to the patient. I discussed the incision type and location and that we would need radiology involved on with a wire or seed marker and/or sentinel node.  The risks and benefits of the procedure were described to the patient and she wishes to proceed.  We discussed the risks bleeding, infection, damage to other structures, need for further  procedures/surgeries. We discussed the risk of seroma. The patient was advised if the area in the breast in cancer, we may need to go back to surgery for additional tissue to obtain negative margins or for a lymph node biopsy. The patient was advised that these are the   most common complications, but that others can occur as well. They were advised against taking aspirin or other anti-inflammatory agents/blood thinners the week before surgery. Current Plans You are being scheduled for surgery- Our schedulers will call you.  You should hear from our office's scheduling department within 5 working days about the location, date, and time of surgery. We try to make accommodations for patient's preferences in scheduling surgery, but sometimes the OR schedule or the surgeon's schedule prevents us from making those accommodations.  If you have not heard from our office ([phone]) in 5 working days, call the office and ask for your surgeon's nurse.  If you have other questions about your diagnosis, plan, or surgery, call the office and ask for your surgeon's nurse.  Pt Education - flb breast cancer surgery: discussed with patient and provided information.   

## 2021-01-14 ENCOUNTER — Other Ambulatory Visit (HOSPITAL_COMMUNITY)
Admission: RE | Admit: 2021-01-14 | Discharge: 2021-01-14 | Disposition: A | Discharge status: Home / Self Care | Payer: Medicare Other | Source: Ambulatory Visit | Attending: General Surgery | Admitting: General Surgery

## 2021-01-14 DIAGNOSIS — Z20822 Contact with and (suspected) exposure to covid-19: Secondary | ICD-10-CM | POA: Insufficient documentation

## 2021-01-14 DIAGNOSIS — Z01812 Encounter for preprocedural laboratory examination: Secondary | ICD-10-CM | POA: Insufficient documentation

## 2021-01-14 LAB — SARS CORONAVIRUS 2 (TAT 6-24 HRS): SARS Coronavirus 2: NEGATIVE

## 2021-01-15 ENCOUNTER — Encounter (HOSPITAL_BASED_OUTPATIENT_CLINIC_OR_DEPARTMENT_OTHER)
Admission: RE | Admit: 2021-01-15 | Discharge: 2021-01-15 | Disposition: A | Discharge status: Home / Self Care | Payer: Medicare Other | Source: Ambulatory Visit | Attending: General Surgery | Admitting: General Surgery

## 2021-01-15 DIAGNOSIS — Z01812 Encounter for preprocedural laboratory examination: Secondary | ICD-10-CM | POA: Diagnosis not present

## 2021-01-15 LAB — BASIC METABOLIC PANEL
Anion gap: 11 (ref 5–15)
BUN: 13 mg/dL (ref 8–23)
CO2: 25 mmol/L (ref 22–32)
Calcium: 9 mg/dL (ref 8.9–10.3)
Chloride: 103 mmol/L (ref 98–111)
Creatinine, Ser: 0.87 mg/dL (ref 0.44–1.00)
GFR, Estimated: >60 mL/min (ref >60)
Glucose, Bld: 196 mg/dL — ABNORMAL HIGH (ref 70–99)
Potassium: 3.9 mmol/L (ref 3.5–5.1)
Sodium: 139 mmol/L (ref 135–145)

## 2021-01-17 ENCOUNTER — Ambulatory Visit (HOSPITAL_BASED_OUTPATIENT_CLINIC_OR_DEPARTMENT_OTHER): Admission: RE | Admit: 2021-01-17 | Payer: Medicare Other | Source: Home / Self Care | Admitting: General Surgery

## 2021-01-17 ENCOUNTER — Ambulatory Visit (HOSPITAL_COMMUNITY): Payer: Medicare Other

## 2021-01-17 HISTORY — DX: Family history of other specified conditions: Z84.89

## 2021-01-17 SURGERY — BREAST LUMPECTOMY WITH RADIOACTIVE SEED AND SENTINEL LYMPH NODE BIOPSY
Anesthesia: General | Laterality: Right

## 2021-01-22 ENCOUNTER — Encounter: Payer: Self-pay | Admitting: *Deleted

## 2021-02-07 DIAGNOSIS — I10 Essential (primary) hypertension: Secondary | ICD-10-CM | POA: Diagnosis not present

## 2021-02-07 DIAGNOSIS — G43909 Migraine, unspecified, not intractable, without status migrainosus: Secondary | ICD-10-CM | POA: Diagnosis not present

## 2021-02-07 DIAGNOSIS — E1169 Type 2 diabetes mellitus with other specified complication: Secondary | ICD-10-CM | POA: Diagnosis not present

## 2021-02-07 DIAGNOSIS — M797 Fibromyalgia: Secondary | ICD-10-CM | POA: Diagnosis not present

## 2021-02-08 DIAGNOSIS — I10 Essential (primary) hypertension: Secondary | ICD-10-CM | POA: Diagnosis not present

## 2021-02-08 DIAGNOSIS — E1169 Type 2 diabetes mellitus with other specified complication: Secondary | ICD-10-CM | POA: Diagnosis not present

## 2021-02-08 DIAGNOSIS — I4891 Unspecified atrial fibrillation: Secondary | ICD-10-CM | POA: Diagnosis not present

## 2021-02-08 DIAGNOSIS — E7800 Pure hypercholesterolemia, unspecified: Secondary | ICD-10-CM | POA: Diagnosis not present

## 2021-02-11 ENCOUNTER — Ambulatory Visit: Payer: Medicare Other | Admitting: Physical Therapy

## 2021-02-12 ENCOUNTER — Other Ambulatory Visit: Payer: Self-pay | Admitting: Unknown Physician Specialty

## 2021-02-12 DIAGNOSIS — R5381 Other malaise: Secondary | ICD-10-CM

## 2021-02-12 NOTE — Progress Notes (Addendum)
Surgical Instructions    Your procedure is scheduled on 02/18/21.  Report to [facility] Main Entrance "A" at 05:30 A.M., then check in with the Admitting office.  Call this number if you have problems the morning of surgery:  [phone]   If you have any questions prior to your surgery date call [phone]: Open Monday-Friday 8am-4pm    Remember:  Do not eat after midnight the night before your surgery  You may drink clear liquids until 04:30am the morning of your surgery.   Clear liquids allowed are: Water, Non-Citrus Juices (without pulp), Carbonated Beverages, Clear Tea, Black Coffee Only, and Gatorade    Take these medicines the morning of surgery with A SIP OF WATER  acetaminophen (TYLENOL) if needed albuterol (VENTOLIN HFA) if needed (bring inhalers with you the day of surgery) cetirizine (ZYRTEC) pantoprazole (PROTONIX) SUMAtriptan (IMITREX) if needed venlafaxine XR (EFFEXOR-XR)   As of today, STOP taking any Aspirin (unless otherwise instructed by your surgeon) Aleve, Naproxen, Ibuprofen, Motrin, Advil, Goody's, BC's, all herbal medications, fish oil, and all vitamins.  Please stop taking your apixaban (ELIQUIS) 2 days prior to surgery. Your last dose of Eliquis will be  02/15/2021.   WHAT DO I DO ABOUT MY DIABETES MEDICATION?   Marland Kitchen Do not take oral diabetes medicines (pills) the morning of surgery.      . THE MORNING OF SURGERY, do not take metFORMIN (GLUCOPHAGE-XR) .  Marland Kitchen The day of surgery, do not take other diabetes injectables, including Byetta (exenatide), Bydureon (exenatide ER), Victoza (liraglutide), or Trulicity (dulaglutide).  . If your CBG is greater than 220 mg/dL, you may take  of your sliding scale (correction) dose of insulin.   HOW TO MANAGE YOUR DIABETES BEFORE AND AFTER SURGERY  Why is it important to control my blood sugar before and after surgery? . Improving blood sugar levels before and after surgery helps healing and can limit problems. . A  way of improving blood sugar control is eating a healthy diet by: o  Eating less sugar and carbohydrates o  Increasing activity/exercise o  Talking with your doctor about reaching your blood sugar goals . High blood sugars (greater than 180 mg/dL) can raise your risk of infections and slow your recovery, so you will need to focus on controlling your diabetes during the weeks before surgery. . Make sure that the doctor who takes care of your diabetes knows about your planned surgery including the date and location.  How do I manage my blood sugar before surgery? . Check your blood sugar at least 4 times a day, starting 2 days before surgery, to make sure that the level is not too high or low. . Check your blood sugar the morning of your surgery when you wake up and every 2 hours until you get to the Short Stay unit. o If your blood sugar is less than 70 mg/dL, you will need to treat for low blood sugar: - Do not take insulin. - Treat a low blood sugar (less than 70 mg/dL) with  cup of clear juice (cranberry or apple), 4 glucose tablets, OR glucose gel. - Recheck blood sugar in 15 minutes after treatment (to make sure it is greater than 70 mg/dL). If your blood sugar is not greater than 70 mg/dL on recheck, call [phone] for further instructions. . Report your blood sugar to the short stay nurse when you get to Short Stay.  . If you are admitted to the hospital after surgery: o Your blood sugar  will be checked by the staff and you will probably be given insulin after surgery (instead of oral diabetes medicines) to make sure you have good blood sugar levels. o The goal for blood sugar control after surgery is 80-180 mg/dL.                      Do not wear jewelry, make up, or nail polish            Do not wear lotions, powders, perfumes/colognes, or deodorant.            Do not shave 48 hours prior to surgery.              Do not bring valuables to the hospital.            [facility] is  not responsible for any belongings or valuables.  Do NOT Smoke (Tobacco/Vaping) or drink Alcohol 24 hours prior to your procedure If you use a CPAP at night, you may bring all equipment for your overnight stay.   Contacts, glasses, dentures or bridgework may not be worn into surgery, please bring cases for these belongings   For patients admitted to the hospital, discharge time will be determined by your treatment team.   Patients discharged the day of surgery will not be allowed to drive home, and someone needs to stay with them for 24 hours.    Special instructions:   [facility]- Preparing For Surgery  Before surgery, you can play an important role. Because skin is not sterile, your skin needs to be as free of germs as possible. You can reduce the number of germs on your skin by washing with CHG (chlorahexidine gluconate) Soap before surgery.  CHG is an antiseptic cleaner which kills germs and bonds with the skin to continue killing germs even after washing.    Oral Hygiene is also important to reduce your risk of infection.  Remember - BRUSH YOUR TEETH THE MORNING OF SURGERY WITH YOUR REGULAR TOOTHPASTE  Please do not use if you have an allergy to CHG or antibacterial soaps. If your skin becomes reddened/irritated stop using the CHG.  Do not shave (including legs and underarms) for at least 48 hours prior to first CHG shower. It is OK to shave your face.  Please follow these instructions carefully.   1. Shower the NIGHT BEFORE SURGERY and the MORNING OF SURGERY  2. If you chose to wash your hair, wash your hair first as usual with your normal shampoo.  3. After you shampoo, rinse your hair and body thoroughly to remove the shampoo.  4. Wash Face and genitals (private parts) with your normal soap.   5.  Shower the NIGHT BEFORE SURGERY and the MORNING OF SURGERY with CHG Soap.   6. Use CHG Soap as you would any other liquid soap. You can apply CHG directly to the skin and wash  gently with a scrungie or a clean washcloth.   7. Apply the CHG Soap to your body ONLY FROM THE NECK DOWN.  Do not use on open wounds or open sores. Avoid contact with your eyes, ears, mouth and genitals (private parts). Wash Face and genitals (private parts)  with your normal soap.   8. Wash thoroughly, paying special attention to the area where your surgery will be performed.  9. Thoroughly rinse your body with warm water from the neck down.  10. DO NOT shower/wash with your normal soap after using and rinsing  off the CHG Soap.  11. Pat yourself dry with a CLEAN TOWEL.  12. Wear CLEAN PAJAMAS to bed the night before surgery  13. Place CLEAN SHEETS on your bed the night before your surgery  14. DO NOT SLEEP WITH PETS.   Day of Surgery: Take a shower with CHG soap.  Wear Clean/Comfortable clothing the morning of surgery Do not apply any deodorants/lotions.   Remember to brush your teeth WITH YOUR REGULAR TOOTHPASTE.   Please read over the following fact sheets that you were given.

## 2021-02-13 ENCOUNTER — Encounter (HOSPITAL_COMMUNITY): Payer: Self-pay

## 2021-02-13 ENCOUNTER — Other Ambulatory Visit (HOSPITAL_COMMUNITY): Payer: Medicare Other

## 2021-02-13 ENCOUNTER — Other Ambulatory Visit: Payer: Self-pay

## 2021-02-13 ENCOUNTER — Encounter (HOSPITAL_COMMUNITY)
Admission: RE | Admit: 2021-02-13 | Discharge: 2021-02-13 | Disposition: A | Discharge status: Home / Self Care | Payer: Medicare Other | Source: Ambulatory Visit | Attending: General Surgery | Admitting: General Surgery

## 2021-02-13 ENCOUNTER — Other Ambulatory Visit: Payer: Self-pay | Admitting: Unknown Physician Specialty

## 2021-02-13 DIAGNOSIS — Z20822 Contact with and (suspected) exposure to covid-19: Secondary | ICD-10-CM | POA: Diagnosis not present

## 2021-02-13 DIAGNOSIS — Z13820 Encounter for screening for osteoporosis: Secondary | ICD-10-CM

## 2021-02-13 DIAGNOSIS — Z01812 Encounter for preprocedural laboratory examination: Secondary | ICD-10-CM | POA: Diagnosis not present

## 2021-02-13 HISTORY — DX: Cardiac arrhythmia, unspecified: I49.9

## 2021-02-13 HISTORY — DX: Dyspnea, unspecified: R06.00

## 2021-02-13 HISTORY — DX: Anemia, unspecified: D64.9

## 2021-02-13 HISTORY — DX: Personal history of other diseases of the digestive system: Z87.19

## 2021-02-13 HISTORY — DX: Headache, unspecified: R51.9

## 2021-02-13 LAB — HEMOGLOBIN A1C
Hgb A1c MFr Bld: 6.7 % — ABNORMAL HIGH (ref 4.8–5.6)
Mean Plasma Glucose: 145.59 mg/dL

## 2021-02-13 LAB — BASIC METABOLIC PANEL
Anion gap: 11 (ref 5–15)
BUN: 13 mg/dL (ref 8–23)
CO2: 28 mmol/L (ref 22–32)
Calcium: 9.3 mg/dL (ref 8.9–10.3)
Chloride: 98 mmol/L (ref 98–111)
Creatinine, Ser: 0.81 mg/dL (ref 0.44–1.00)
GFR, Estimated: >60 mL/min (ref >60)
Glucose, Bld: 100 mg/dL — ABNORMAL HIGH (ref 70–99)
Potassium: 3.9 mmol/L (ref 3.5–5.1)
Sodium: 137 mmol/L (ref 135–145)

## 2021-02-13 LAB — SARS CORONAVIRUS 2 (TAT 6-24 HRS): SARS Coronavirus 2: NEGATIVE

## 2021-02-13 LAB — CBC
HCT: 34.2 % — ABNORMAL LOW (ref 36.0–46.0)
Hemoglobin: 10.2 g/dL — ABNORMAL LOW (ref 12.0–15.0)
MCH: 25.3 pg — ABNORMAL LOW (ref 26.0–34.0)
MCHC: 29.8 g/dL — ABNORMAL LOW (ref 30.0–36.0)
MCV: 84.9 fL (ref 80.0–100.0)
Platelets: 268 10*3/uL (ref 150–400)
RBC: 4.03 MIL/uL (ref 3.87–5.11)
RDW: 17 % — ABNORMAL HIGH (ref 11.5–15.5)
WBC: 6.7 10*3/uL (ref 4.0–10.5)
nRBC: 0 % (ref 0.0–0.2)

## 2021-02-13 LAB — GLUCOSE, CAPILLARY: Glucose-Capillary: 108 mg/dL — ABNORMAL HIGH (ref 70–99)

## 2021-02-13 NOTE — Progress Notes (Addendum)
PCP - Quincy Sheehan Cardiologist - Gwyndolyn Kaufman  PPM/ICD - denies   Chest x-ray - n/a EKG - 12/18/20 Stress Test - 12/27/20 ECHO - 12/21/20 Cardiac Cath - denies  Sleep Study - denies   Fasting Blood Sugar -120-130  Checks Blood Sugar as needed  Patient instructed to hold all Aspirin, NSAID's, herbal medications, fish oil and vitamins 7 days prior to surgery. Instructed to hold Eliquis two days prior to surgery.    ERAS Protcol -yes   COVID TEST- 02/13/21   Anesthesia review: yes, scheduled for seed placement. Cardiac clearance in epic   Patient denies shortness of breath, fever, cough and chest pain at PAT appointment   All instructions explained to the patient, with a verbal understanding of the material. Patient agrees to go over the instructions while at home for a better understanding. Patient also instructed to self quarantine after being tested for COVID-19. The opportunity to ask questions was provided.

## 2021-02-14 ENCOUNTER — Other Ambulatory Visit (HOSPITAL_COMMUNITY)
Admission: RE | Admit: 2021-02-14 | Discharge: 2021-02-14 | Disposition: A | Discharge status: Home / Self Care | Payer: Medicare Other | Source: Ambulatory Visit | Attending: General Surgery | Admitting: General Surgery

## 2021-02-14 DIAGNOSIS — Z20822 Contact with and (suspected) exposure to covid-19: Secondary | ICD-10-CM | POA: Diagnosis not present

## 2021-02-14 DIAGNOSIS — C50411 Malignant neoplasm of upper-outer quadrant of right female breast: Secondary | ICD-10-CM | POA: Diagnosis not present

## 2021-02-14 DIAGNOSIS — Z01812 Encounter for preprocedural laboratory examination: Secondary | ICD-10-CM | POA: Diagnosis not present

## 2021-02-14 LAB — SARS CORONAVIRUS 2 (TAT 6-24 HRS): SARS Coronavirus 2: NEGATIVE

## 2021-02-14 NOTE — Progress Notes (Signed)
Anesthesia Chart Review:  Case: [id number] Date/Time: 02/18/21 0715   Procedure: RIGHT BREAST LUMPECTOMY WITH RADIOACTIVE SEED AND SENTINEL LYMPH NODE BIOPSY (Right )   Anesthesia type: General   Pre-op diagnosis: RIGHT BREAST CANCER   Location: [place] OR ROOM 02 / [place] OR   Surgeons: [name], MD      DISCUSSION: Patient is a 70 year old female scheduled for the above procedure. Surgery was initially scheduled for 12/20/20, but postponed due to finding of new onset afib at 12/18/20 PAT visit. Since then she was evaluated by cardiologist Dr. Johney Frame. Echo showed EF 50-55%, no wall motion abnormalities, moderately dilated LA, moderate MR. Stress test was non-ischemic.   History includes never smoker, DM2, fibromyalgia, psoriasis, HTN, asthma, afib (new diagnosis 12/18/20), breast cancer (right 11/2020), anemia, hiatal hernia, dyspnea. BMI is consistent with obesity.   Preoperative cardiology input by Dr. Johney Frame, "Patient's stress test is normal without evidence of ischemia. Her pumping function was normal. She is cleared from a Cardiology standpoint to undergo her surgery. Okay to hold apixaban prior to the procedure and resume once cleared from surgical standpoint." Eliquis is being held two days prior to surgery.  02/13/21 presurgical COVID-19 test negative. Anesthesia team to evaluate on the day of surgery.   VS: BP (!) 158/99   Pulse 91   Temp 37.2 C (Oral)   Resp 18   Ht 5\' 2"  (1.575 m)   Wt 94.7 kg   SpO2 100%   BMI 38.19 kg/m    PROVIDERS: [name], MD is PCP  Gwyndolyn Kaufman, MD is cardiologist Truitt Merle, MD is HEM-ONC Eppie Gibson, MD is RAD-ONC   LABS: Labs reviewed: Acceptable for surgery. (all labs ordered are listed, but only abnormal results are displayed)  Labs Reviewed  GLUCOSE, CAPILLARY - Abnormal; Notable for the following components:      Result Value   Glucose-Capillary 108 (*)    All other components within normal limits  BASIC METABOLIC PANEL  - Abnormal; Notable for the following components:   Glucose, Bld 100 (*)    All other components within normal limits  HEMOGLOBIN A1C - Abnormal; Notable for the following components:   Hgb A1c MFr Bld 6.7 (*)    All other components within normal limits  CBC - Abnormal; Notable for the following components:   Hemoglobin 10.2 (*)    HCT 34.2 (*)    MCH 25.3 (*)    MCHC 29.8 (*)    RDW 17.0 (*)    All other components within normal limits  SARS CORONAVIRUS 2 (TAT 6-24 HRS)    EKG: 12/18/20: Afib at 69 bpm   CV: Nuclear stress test 12/27/20:  The left ventricular ejection fraction is hyperdynamic (>65%).  Nuclear stress EF: 74%.  There was no ST segment deviation noted during stress.  The study is normal.  This is a low risk study. Normal pharmacologic nuclear stress test with no evidence for prior infarct or ischemia. Normal LVEF.   Echo 12/21/20: IMPRESSIONS  1. Left ventricular ejection fraction, by estimation, is 50 to 55%. The  left ventricle has low normal function. The left ventricle has no regional  wall motion abnormalities. Left ventricular diastolic parameters are  indeterminate.  2. Right ventricular systolic function is normal. The right ventricular  size is not well visualized.  3. Left atrial size was moderately dilated.  4. The mitral valve is normal in structure. At most moderate mitral valve  regurgitation with variability in beat to beat  assessment. Pulmonary vein  systolic blunting. 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 No evidence of mitral  stenosis.  5. The aortic valve is normal in structure. Aortic valve regurgitation is  not visualized. No aortic stenosis is present.  6. The inferior vena cava is normal in size with greater than 50%  respiratory variability, suggesting right atrial pressure of 3 mmHg.    Past Medical History:  Diagnosis Date  . Anemia   . Arthritis   . Asthma   . Diabetes ([place])   . Dyspnea    . Dysrhythmia    afibb on occasion  . Family history of adverse reaction to anesthesia    mother had reaction to versed...during surgery, stopped breathing  . Fibromyalgia   . GERD (gastroesophageal reflux disease)   . Headache   . History of hiatal hernia   . Hypertension   . Osteopenia   . Psoriasis     Past Surgical History:  Procedure Laterality Date  . ABDOMINAL HYSTERECTOMY    . APPENDECTOMY    . broken wrist    . CESAREAN SECTION  1976 and 1977  . CHOLECYSTECTOMY    . FRACTURE SURGERY     right wrist  . HYSTERECTOMY ABDOMINAL WITH SALPINGO-OOPHORECTOMY    . LYSIS OF ADHESION    . SHOULDER SURGERY Left   . TUBAL LIGATION      MEDICATIONS: . acetaminophen (TYLENOL) 500 MG tablet  . albuterol (VENTOLIN HFA) 108 (90 Base) MCG/ACT inhaler  . Alirocumab (PRALUENT) 150 MG/ML SOAJ  . apixaban (ELIQUIS) 5 MG TABS tablet  . cetirizine (ZYRTEC) 10 MG tablet  . cholecalciferol (VITAMIN D3) 25 MCG (1000 UNIT) tablet  . gabapentin (NEURONTIN) 100 MG capsule  . ibuprofen (ADVIL) 200 MG tablet  . losartan-hydrochlorothiazide (HYZAAR) 100-25 MG tablet  . metFORMIN (GLUCOPHAGE-XR) 500 MG 24 hr tablet  . pantoprazole (PROTONIX) 40 MG tablet  . Prenatal Vit-Fe Fumarate-FA (PRENATAL VITAMIN PLUS LOW IRON PO)  . SUMAtriptan (IMITREX) 50 MG tablet  . triamcinolone (KENALOG) 0.1 %  . venlafaxine XR (EFFEXOR-XR) 75 MG 24 hr capsule   No current facility-administered medications for this encounter.    Myra Gianotti, PA-C Surgical Short Stay/Anesthesiology [facility] Phone [phone] [facility] Phone [phone] 02/14/2021 11:32 AM

## 2021-02-14 NOTE — Anesthesia Preprocedure Evaluation (Addendum)
Anesthesia Evaluation  Patient identified by MRN, date of birth, ID band Patient awake    Reviewed: Allergy & Precautions, NPO status , Patient's Chart, lab work & pertinent test results  History of Anesthesia Complications Negative for: history of anesthetic complications  Airway Mallampati: II  TM Distance: >3 FB Neck ROM: Full    Dental  (+) Teeth Intact   Pulmonary asthma ,    Pulmonary exam normal        Cardiovascular hypertension, Pt. on medications + dysrhythmias Atrial Fibrillation  Rhythm:Irregular Rate:Normal     Neuro/Psych  Headaches,    GI/Hepatic Neg liver ROS, hiatal hernia, GERD  ,  Endo/Other  diabetes, Type 2, Oral Hypoglycemic Agents  Renal/GU negative Renal ROS  negative genitourinary   Musculoskeletal  (+) Fibromyalgia -  Abdominal   Peds  Hematology  (+) anemia , Eliquis   Anesthesia Other Findings  R breast cancer  Surgery was initially scheduled for 12/20/20, but postponed due to finding of new onset afib at 12/18/20 PAT visit. Since then she was evaluated by cardiologist Dr. Johney Frame. Echo showed EF 50-55%, no wall motion abnormalities, moderately dilated LA, moderate MR. Stress test was non-ischemic.   Preoperative cardiology input by Dr. Johney Frame, "Patient's stress test is normal without evidence of ischemia. Her pumping function was normal. She is cleared from a Cardiology standpoint to undergo her surgery. Okay to hold apixaban prior to the procedure and resume once cleared from surgical standpoint."  Reproductive/Obstetrics                           Anesthesia Physical Anesthesia Plan  ASA: II  Anesthesia Plan: General   Post-op Pain Management: GA combined w/ Regional for post-op pain   Induction: Intravenous  PONV Risk Score and Plan: 3 and Ondansetron, Dexamethasone, Midazolam and Treatment may vary due to age or medical condition  Airway Management  Planned: LMA  Additional Equipment: None  Intra-op Plan:   Post-operative Plan: Extubation in OR  Informed Consent: I have reviewed the patients History and Physical, chart, labs and discussed the procedure including the risks, benefits and alternatives for the proposed anesthesia with the patient or authorized representative who has indicated his/her understanding and acceptance.     Dental advisory given  Plan Discussed with:   Anesthesia Plan Comments: (PAT note written 02/14/2021 by Myra Gianotti, PA-C. )      Anesthesia Quick Evaluation

## 2021-02-18 ENCOUNTER — Ambulatory Visit (HOSPITAL_COMMUNITY)
Admission: RE | Admit: 2021-02-18 | Discharge: 2021-02-18 | Disposition: A | Discharge status: Home / Self Care | Payer: Medicare Other | Attending: General Surgery | Admitting: General Surgery

## 2021-02-18 ENCOUNTER — Encounter (HOSPITAL_COMMUNITY)
Admission: RE | Admit: 2021-02-18 | Discharge: 2021-02-18 | Disposition: A | Discharge status: Home / Self Care | Payer: Medicare Other | Source: Ambulatory Visit | Attending: General Surgery | Admitting: General Surgery

## 2021-02-18 ENCOUNTER — Ambulatory Visit (HOSPITAL_COMMUNITY): Payer: Medicare Other | Admitting: Vascular Surgery

## 2021-02-18 ENCOUNTER — Other Ambulatory Visit: Payer: Self-pay

## 2021-02-18 ENCOUNTER — Ambulatory Visit (HOSPITAL_COMMUNITY): Payer: Medicare Other | Admitting: Certified Registered Nurse Anesthetist

## 2021-02-18 ENCOUNTER — Encounter (HOSPITAL_COMMUNITY): Payer: Self-pay | Admitting: General Surgery

## 2021-02-18 ENCOUNTER — Encounter (HOSPITAL_COMMUNITY)
Admission: RE | Disposition: A | Discharge status: Home / Self Care | Payer: Self-pay | Source: Home / Self Care | Attending: General Surgery

## 2021-02-18 DIAGNOSIS — C50411 Malignant neoplasm of upper-outer quadrant of right female breast: Secondary | ICD-10-CM | POA: Insufficient documentation

## 2021-02-18 DIAGNOSIS — G8918 Other acute postprocedural pain: Secondary | ICD-10-CM | POA: Diagnosis not present

## 2021-02-18 DIAGNOSIS — Z85820 Personal history of malignant melanoma of skin: Secondary | ICD-10-CM | POA: Diagnosis not present

## 2021-02-18 DIAGNOSIS — Z7901 Long term (current) use of anticoagulants: Secondary | ICD-10-CM | POA: Diagnosis not present

## 2021-02-18 DIAGNOSIS — Z79899 Other long term (current) drug therapy: Secondary | ICD-10-CM | POA: Insufficient documentation

## 2021-02-18 DIAGNOSIS — C50911 Malignant neoplasm of unspecified site of right female breast: Secondary | ICD-10-CM | POA: Diagnosis not present

## 2021-02-18 DIAGNOSIS — Z170 Estrogen receptor positive status [ER+]: Secondary | ICD-10-CM | POA: Insufficient documentation

## 2021-02-18 DIAGNOSIS — D630 Anemia in neoplastic disease: Secondary | ICD-10-CM | POA: Diagnosis not present

## 2021-02-18 HISTORY — PX: BREAST LUMPECTOMY WITH RADIOACTIVE SEED AND SENTINEL LYMPH NODE BIOPSY: SHX6550

## 2021-02-18 LAB — GLUCOSE, CAPILLARY
Glucose-Capillary: 141 mg/dL — ABNORMAL HIGH (ref 70–99)
Glucose-Capillary: 142 mg/dL — ABNORMAL HIGH (ref 70–99)

## 2021-02-18 SURGERY — BREAST LUMPECTOMY WITH RADIOACTIVE SEED AND SENTINEL LYMPH NODE BIOPSY
Anesthesia: Regional | Site: Breast | Laterality: Right

## 2021-02-18 MED ORDER — ONDANSETRON HCL 4 MG/2ML IJ SOLN
INTRAMUSCULAR | Status: AC
  Filled 2021-02-18: qty 2

## 2021-02-18 MED ORDER — SODIUM CHLORIDE 0.9 % IV SOLN: INTRAVENOUS | Status: DC | PRN

## 2021-02-18 MED ORDER — BUPIVACAINE-EPINEPHRINE (PF) 0.25% -1:200000 IJ SOLN
INTRAMUSCULAR | Status: AC
  Filled 2021-02-18: qty 30

## 2021-02-18 MED ORDER — PHENYLEPHRINE HCL-NACL 10-0.9 MG/250ML-% IV SOLN
INTRAVENOUS | Status: DC | PRN
  Administered 2021-02-18: 30 ug/min via INTRAVENOUS

## 2021-02-18 MED ORDER — SODIUM CHLORIDE (PF) 0.9 % IJ SOLN
INTRAMUSCULAR | Status: AC
  Filled 2021-02-18: qty 10

## 2021-02-18 MED ORDER — ORAL CARE MOUTH RINSE: 15.0000 mL | Freq: Once | OROMUCOSAL | Status: AC

## 2021-02-18 MED ORDER — KETAMINE HCL 10 MG/ML IJ SOLN
INTRAMUSCULAR | Status: DC | PRN
  Administered 2021-02-18: 30 mg via INTRAVENOUS

## 2021-02-18 MED ORDER — LIDOCAINE HCL 1 % IJ SOLN
INTRAMUSCULAR | Status: AC
  Filled 2021-02-18: qty 20

## 2021-02-18 MED ORDER — BUPIVACAINE HCL (PF) 0.25 % IJ SOLN
INTRAMUSCULAR | Status: AC
  Filled 2021-02-18: qty 30

## 2021-02-18 MED ORDER — LIDOCAINE HCL 1 % IJ SOLN
INTRAMUSCULAR | Status: DC | PRN
  Administered 2021-02-18: 20 mL

## 2021-02-18 MED ORDER — MIDAZOLAM HCL 2 MG/2ML IJ SOLN
INTRAMUSCULAR | Status: DC | PRN
  Administered 2021-02-18 (×2): 1 mg via INTRAVENOUS

## 2021-02-18 MED ORDER — STERILE WATER FOR IRRIGATION IR SOLN
Status: DC | PRN
  Administered 2021-02-18: 1000 mL

## 2021-02-18 MED ORDER — OXYCODONE HCL 5 MG/5ML PO SOLN: 5.0000 mg | Freq: Once | ORAL | Status: DC | PRN

## 2021-02-18 MED ORDER — ONDANSETRON HCL 4 MG/2ML IJ SOLN
INTRAMUSCULAR | Status: DC | PRN
  Administered 2021-02-18: 4 mg via INTRAVENOUS

## 2021-02-18 MED ORDER — OXYCODONE HCL 5 MG PO TABS: 5.0000 mg | ORAL_TABLET | Freq: Four times a day (QID) | ORAL | 0 refills | Status: DC | PRN

## 2021-02-18 MED ORDER — PROPOFOL 10 MG/ML IV BOLUS
INTRAVENOUS | Status: AC
  Filled 2021-02-18: qty 20

## 2021-02-18 MED ORDER — GLYCOPYRROLATE PF 0.2 MG/ML IJ SOSY
PREFILLED_SYRINGE | INTRAMUSCULAR | Status: DC | PRN
  Administered 2021-02-18: .2 mg via INTRAVENOUS

## 2021-02-18 MED ORDER — OXYCODONE HCL 5 MG PO TABS: 5.0000 mg | ORAL_TABLET | Freq: Once | ORAL | Status: DC | PRN

## 2021-02-18 MED ORDER — MIDAZOLAM HCL 2 MG/2ML IJ SOLN
INTRAMUSCULAR | Status: AC
  Filled 2021-02-18: qty 2

## 2021-02-18 MED ORDER — 0.9 % SODIUM CHLORIDE (POUR BTL) OPTIME
TOPICAL | Status: DC | PRN
  Administered 2021-02-18: 1000 mL

## 2021-02-18 MED ORDER — PROPOFOL 10 MG/ML IV BOLUS
INTRAVENOUS | Status: AC
  Filled 2021-02-18: qty 40

## 2021-02-18 MED ORDER — DEXAMETHASONE SODIUM PHOSPHATE 10 MG/ML IJ SOLN
INTRAMUSCULAR | Status: DC | PRN
  Administered 2021-02-18: 5 mg via INTRAVENOUS

## 2021-02-18 MED ORDER — ACETAMINOPHEN 500 MG PO TABS: 1000.0000 mg | ORAL_TABLET | ORAL | Status: AC

## 2021-02-18 MED ORDER — PROPOFOL 10 MG/ML IV BOLUS
INTRAVENOUS | Status: DC | PRN
  Administered 2021-02-18: 140 mg via INTRAVENOUS

## 2021-02-18 MED ORDER — PHENYLEPHRINE HCL-NACL 10-0.9 MG/250ML-% IV SOLN
INTRAVENOUS | Status: AC
  Filled 2021-02-18: qty 500

## 2021-02-18 MED ORDER — FENTANYL CITRATE (PF) 100 MCG/2ML IJ SOLN
INTRAMUSCULAR | Status: AC
  Administered 2021-02-18: 50 ug via INTRAVENOUS
  Filled 2021-02-18: qty 2

## 2021-02-18 MED ORDER — METHYLENE BLUE 0.5 % INJ SOLN
INTRAVENOUS | Status: AC
  Filled 2021-02-18: qty 10

## 2021-02-18 MED ORDER — CHLORHEXIDINE GLUCONATE 0.12 % MT SOLN
15.0000 mL | Freq: Once | OROMUCOSAL | Status: AC
  Administered 2021-02-18: 15 mL via OROMUCOSAL
  Filled 2021-02-18: qty 15

## 2021-02-18 MED ORDER — LACTATED RINGERS IV SOLN: INTRAVENOUS | Status: DC

## 2021-02-18 MED ORDER — FENTANYL CITRATE (PF) 250 MCG/5ML IJ SOLN
INTRAMUSCULAR | Status: AC
  Filled 2021-02-18: qty 5

## 2021-02-18 MED ORDER — PROPOFOL 1000 MG/100ML IV EMUL
INTRAVENOUS | Status: AC
  Filled 2021-02-18: qty 200

## 2021-02-18 MED ORDER — PHENYLEPHRINE 40 MCG/ML (10ML) SYRINGE FOR IV PUSH (FOR BLOOD PRESSURE SUPPORT)
PREFILLED_SYRINGE | INTRAVENOUS | Status: DC | PRN
  Administered 2021-02-18: 60 ug via INTRAVENOUS
  Administered 2021-02-18: 100 ug via INTRAVENOUS

## 2021-02-18 MED ORDER — ONDANSETRON HCL 4 MG/2ML IJ SOLN: 4.0000 mg | Freq: Once | INTRAMUSCULAR | Status: DC | PRN

## 2021-02-18 MED ORDER — ROPIVACAINE HCL 5 MG/ML IJ SOLN
INTRAMUSCULAR | Status: DC | PRN
  Administered 2021-02-18: 30 mL via PERINEURAL

## 2021-02-18 MED ORDER — CEFAZOLIN SODIUM-DEXTROSE 2-4 GM/100ML-% IV SOLN
INTRAVENOUS | Status: AC
  Filled 2021-02-18: qty 100

## 2021-02-18 MED ORDER — DEXAMETHASONE SODIUM PHOSPHATE 10 MG/ML IJ SOLN
INTRAMUSCULAR | Status: AC
  Filled 2021-02-18: qty 1

## 2021-02-18 MED ORDER — CEFAZOLIN SODIUM-DEXTROSE 2-4 GM/100ML-% IV SOLN
2.0000 g | INTRAVENOUS | Status: AC
  Administered 2021-02-18: 2 g via INTRAVENOUS

## 2021-02-18 MED ORDER — TECHNETIUM TC 99M TILMANOCEPT KIT
1.0000 | PACK | Freq: Once | INTRAVENOUS | Status: AC | PRN
  Administered 2021-02-18: 1 via INTRADERMAL

## 2021-02-18 MED ORDER — FENTANYL CITRATE (PF) 250 MCG/5ML IJ SOLN
INTRAMUSCULAR | Status: DC | PRN
  Administered 2021-02-18 (×2): 25 ug via INTRAVENOUS
  Administered 2021-02-18: 50 ug via INTRAVENOUS
  Administered 2021-02-18: 25 ug via INTRAVENOUS

## 2021-02-18 MED ORDER — KETAMINE HCL 50 MG/5ML IJ SOSY
PREFILLED_SYRINGE | INTRAMUSCULAR | Status: AC
  Filled 2021-02-18: qty 5

## 2021-02-18 MED ORDER — FENTANYL CITRATE (PF) 100 MCG/2ML IJ SOLN
25.0000 ug | INTRAMUSCULAR | Status: DC | PRN
  Administered 2021-02-18 (×2): 25 ug via INTRAVENOUS

## 2021-02-18 MED ORDER — ACETAMINOPHEN 500 MG PO TABS
ORAL_TABLET | ORAL | Status: AC
  Administered 2021-02-18: 1000 mg via ORAL
  Filled 2021-02-18: qty 2

## 2021-02-18 MED ORDER — AMISULPRIDE (ANTIEMETIC) 5 MG/2ML IV SOLN
10.0000 mg | Freq: Once | INTRAVENOUS | Status: DC | PRN
Start: 2021-02-18 — End: 2021-02-18

## 2021-02-18 MED ORDER — LIDOCAINE-EPINEPHRINE 1 %-1:100000 IJ SOLN
INTRAMUSCULAR | Status: AC
  Filled 2021-02-18: qty 1

## 2021-02-18 SURGICAL SUPPLY — 63 items
ADH SKN CLS APL DERMABOND .7 (GAUZE/BANDAGES/DRESSINGS) ×1
APL PRP STRL LF DISP 70% ISPRP (MISCELLANEOUS) ×1
BINDER BREAST LRG (GAUZE/BANDAGES/DRESSINGS) IMPLANT
BINDER BREAST MEDIUM (GAUZE/BANDAGES/DRESSINGS) IMPLANT
BINDER BREAST XLRG (GAUZE/BANDAGES/DRESSINGS) IMPLANT
BINDER BREAST XXLRG (GAUZE/BANDAGES/DRESSINGS) ×1 IMPLANT
BLADE SURG 10 STRL SS (BLADE) ×2 IMPLANT
BLADE SURG 15 STRL LF DISP TIS (BLADE) ×1 IMPLANT
BLADE SURG 15 STRL SS (BLADE) ×2
BNDG COHESIVE 4X5 TAN STRL (GAUZE/BANDAGES/DRESSINGS) ×2 IMPLANT
CANISTER SUC SOCK COL 7IN (MISCELLANEOUS) IMPLANT
CANISTER SUCT 1200ML W/VALVE (MISCELLANEOUS) ×2 IMPLANT
CHLORAPREP W/TINT 26 (MISCELLANEOUS) ×2 IMPLANT
CLIP VESOCCLUDE LG 6/CT (CLIP) ×4 IMPLANT
CLIP VESOCCLUDE MED 6/CT (CLIP) ×4 IMPLANT
CLIP VESOCCLUDE SM WIDE 6/CT (CLIP) IMPLANT
COVER MAYO STAND STRL (DRAPES) ×4 IMPLANT
COVER PROBE W GEL 5X96 (DRAPES) ×2 IMPLANT
COVER WAND RF STERILE (DRAPES) IMPLANT
DECANTER SPIKE VIAL GLASS SM (MISCELLANEOUS) ×1 IMPLANT
DERMABOND ADVANCED (GAUZE/BANDAGES/DRESSINGS) ×1
DERMABOND ADVANCED .7 DNX12 (GAUZE/BANDAGES/DRESSINGS) ×1 IMPLANT
DRAPE UTILITY XL STRL (DRAPES) ×2 IMPLANT
DRSG PAD ABDOMINAL 8X10 ST (GAUZE/BANDAGES/DRESSINGS) ×2 IMPLANT
ELECT COATED BLADE 2.86 ST (ELECTRODE) ×2 IMPLANT
ELECT REM PT RETURN 9FT ADLT (ELECTROSURGICAL) ×2
ELECTRODE REM PT RTRN 9FT ADLT (ELECTROSURGICAL) ×1 IMPLANT
GAUZE SPONGE 4X4 12PLY STRL LF (GAUZE/BANDAGES/DRESSINGS) ×2 IMPLANT
GLOVE SURG ENC MOIS LTX SZ6 (GLOVE) ×2 IMPLANT
GLOVE SURG UNDER POLY LF SZ6.5 (GLOVE) ×2 IMPLANT
GOWN STRL REUS W/ TWL LRG LVL3 (GOWN DISPOSABLE) ×1 IMPLANT
GOWN STRL REUS W/TWL 2XL LVL3 (GOWN DISPOSABLE) ×2 IMPLANT
GOWN STRL REUS W/TWL LRG LVL3 (GOWN DISPOSABLE) ×2
KIT MARKER MARGIN INK (KITS) ×2 IMPLANT
LIGHT WAVEGUIDE WIDE FLAT (MISCELLANEOUS) IMPLANT
NDL HYPO 25X1 1.5 SAFETY (NEEDLE) ×1 IMPLANT
NDL SAFETY ECLIPSE 18X1.5 (NEEDLE) ×1 IMPLANT
NEEDLE HYPO 18GX1.5 SHARP (NEEDLE) ×2
NEEDLE HYPO 25X1 1.5 SAFETY (NEEDLE) IMPLANT
NS IRRIG 1000ML POUR BTL (IV SOLUTION) ×2 IMPLANT
PACK BASIN DAY SURGERY FS (CUSTOM PROCEDURE TRAY) ×2 IMPLANT
PACK UNIVERSAL I (CUSTOM PROCEDURE TRAY) ×2 IMPLANT
PENCIL SMOKE EVACUATOR (MISCELLANEOUS) ×2 IMPLANT
SLEEVE SCD COMPRESS KNEE MED (STOCKING) ×2 IMPLANT
SPONGE LAP 18X18 RF (DISPOSABLE) ×4 IMPLANT
STAPLER VISISTAT 35W (STAPLE) IMPLANT
STOCKINETTE IMPERVIOUS LG (DRAPES) ×2 IMPLANT
STRIP CLOSURE SKIN 1/2X4 (GAUZE/BANDAGES/DRESSINGS) ×1 IMPLANT
SUT ETHILON 2 0 FS 18 (SUTURE) IMPLANT
SUT MNCRL AB 4-0 PS2 18 (SUTURE) ×3 IMPLANT
SUT MON AB 5-0 PS2 18 (SUTURE) IMPLANT
SUT SILK 2 0 SH (SUTURE) ×1 IMPLANT
SUT VIC AB 2-0 SH 27 (SUTURE) ×4
SUT VIC AB 2-0 SH 27XBRD (SUTURE) ×1 IMPLANT
SUT VIC AB 3-0 SH 27 (SUTURE) ×2
SUT VIC AB 3-0 SH 27X BRD (SUTURE) ×1 IMPLANT
SUT VICRYL 3-0 CR8 SH (SUTURE) ×2 IMPLANT
SYR BULB EAR ULCER 3OZ GRN STR (SYRINGE) ×2 IMPLANT
SYR CONTROL 10ML LL (SYRINGE) ×2 IMPLANT
TOWEL GREEN STERILE FF (TOWEL DISPOSABLE) ×2 IMPLANT
TRAY FAXITRON CT DISP (TRAY / TRAY PROCEDURE) ×2 IMPLANT
TUBE CONNECTING 20X1/4 (TUBING) ×2 IMPLANT
YANKAUER SUCT BULB TIP NO VENT (SUCTIONS) ×2 IMPLANT

## 2021-02-18 NOTE — Interval H&P Note (Signed)
History and Physical Interval Note:  02/18/2021 7:37 AM  Kayla Bennett  has presented today for surgery, with the diagnosis of RIGHT BREAST CANCER.  The various methods of treatment have been discussed with the patient and family. After consideration of risks, benefits and other options for treatment, the patient has consented to  Procedure(s): RIGHT BREAST LUMPECTOMY WITH RADIOACTIVE SEED AND SENTINEL LYMPH NODE BIOPSY (Right) as a surgical intervention.  The patient's history has been reviewed, patient examined, no change in status, stable for surgery.  I have reviewed the patient's chart and labs.  Questions were answered to the patient's satisfaction.     [name]

## 2021-02-18 NOTE — Transfer of Care (Signed)
Immediate Anesthesia Transfer of Care Note  Patient: Kayla Bennett  Procedure(s) Performed: RIGHT BREAST LUMPECTOMY WITH RADIOACTIVE SEED AND SENTINEL LYMPH NODE BIOPSY (Right Breast)  Patient Location: PACU  Anesthesia Type:General and Regional  Level of Consciousness: awake and drowsy  Airway & Oxygen Therapy: Patient Spontanous Breathing and Patient connected to face mask oxygen  Post-op Assessment: Report given to RN and Post -op Vital signs reviewed and stable  Post vital signs: Reviewed  Last Vitals:  Vitals Value Taken Time  BP    Temp    Pulse 92 02/18/21 0946  Resp 6 02/18/21 0946  SpO2 100 % 02/18/21 0946  Vitals shown include unvalidated device data.  Last Pain:  Vitals:   02/18/21 0609  TempSrc:   PainSc: 0-No pain      Patients Stated Pain Goal: 3 (70/17/79 3903)  Complications: No complications documented.

## 2021-02-18 NOTE — Op Note (Signed)
Right Breast Radioactive seed localized lumpectomy and sentinel lymph node biopsy  Indications: This patient presents with history of right breast cancer, cT1bN0, grade 2 invasive ductal carcinoma, +/+/-, upper outer quadrant  Pre-operative Diagnosis: right breast cancer  Post-operative Diagnosis: Same  Surgeon: [name]   Anesthesia: General endotracheal anesthesia  ASA Class: 2  Procedure Details  The patient was seen in the Holding Room. The risks, benefits, complications, treatment options, and expected outcomes were discussed with the patient. The possibilities of bleeding, infection, the need for additional procedures, failure to diagnose a condition, and creating a complication requiring transfusion or operation were discussed with the patient. The patient concurred with the proposed plan, giving informed consent.  The site of surgery properly noted/marked. The patient was taken to Operating Room # 2, identified, and the procedure verified as Right Breast Seed localized Lumpectomy with sentinel lymph node biopsy. A Time Out was held and the above information confirmed.  The right arm, breast, and chest were prepped and draped in standard fashion. The lumpectomy was performed by creating an axillary incision near the previously placed radioactive seed.  Dissection was carried down to around the point of maximum signal intensity. The cautery was used to perform the dissection.  Hemostasis was achieved with cautery.  The specimen was inked with the margin marker paint kit.    Specimen radiography confirmed inclusion of the mammographic lesion, the clip, and the seed.  The background signal in the breast was zero.  The wound was irrigated and closed with 3-0 vicryl in layers and 4-0 monocryl subcuticular suture.    Using a hand-held gamma probe, right axillary sentinel nodes were identified.  This was accessed via the same axillary incision.  Dissection was carried through the clavipectoral  fascia.  Three deep level 2 axillary sentinel nodes were removed.  Counts per second were 680, 65, and 623.    The background count was 19 cps.  The wound was irrigated.  Hemostasis was achieved with cautery.  The axillary incision was closed with a 3-0 vicryl deep dermal interrupted sutures and a 4-0 monocryl subcuticular closure.    Sterile dressings were applied. At the end of the operation, all sponge, instrument, and needle counts were correct.  Findings: grossly clear surgical margins and no adenopathy.  Posterior margin is pectoralis, lateral margin is axillary tissue, anterior margin is skin.  Estimated Blood Loss:  min         Specimens: Right breast tissue with seed and three right axillary sentinel lymph nodes.             Complications:  None; patient tolerated the procedure well.         Disposition: PACU - hemodynamically stable.         Condition: stable

## 2021-02-18 NOTE — Anesthesia Postprocedure Evaluation (Signed)
Anesthesia Post Note  Patient: Kayla Bennett  Procedure(s) Performed: RIGHT BREAST LUMPECTOMY WITH RADIOACTIVE SEED AND SENTINEL LYMPH NODE BIOPSY (Right Breast)     Patient location during evaluation: PACU Anesthesia Type: General Level of consciousness: awake and alert Pain management: pain level controlled Vital Signs Assessment: post-procedure vital signs reviewed and stable Respiratory status: spontaneous breathing, nonlabored ventilation and respiratory function stable Cardiovascular status: blood pressure returned to baseline and stable Postop Assessment: no apparent nausea or vomiting Anesthetic complications: no   No complications documented.  Last Vitals:  Vitals:   02/18/21 1002 02/18/21 1017  BP: (!) 162/91 (!) 154/89  Pulse: 97 95  Resp: (!) 9 13  Temp:  36.7 C  SpO2: 99% 100%    Last Pain:  Vitals:   02/18/21 1017  TempSrc:   PainSc: 2                  Lidia Collum

## 2021-02-18 NOTE — Anesthesia Procedure Notes (Signed)
Procedure Name: LMA Insertion Date/Time: 02/18/2021 8:09 AM Performed by: Hendricks Limes, CRNA Pre-anesthesia Checklist: Patient identified, Emergency Drugs available, Patient being monitored, Suction available and Timeout performed Patient Re-evaluated:Patient Re-evaluated prior to induction Oxygen Delivery Method: Circle system utilized Preoxygenation: Pre-oxygenation with 100% oxygen Induction Type: IV induction Ventilation: Mask ventilation without difficulty LMA: LMA inserted LMA Size: 3.0 Number of attempts: 1 Airway Equipment and Method: Patient positioned with wedge pillow Placement Confirmation: positive ETCO2 and breath sounds checked- equal and bilateral

## 2021-02-18 NOTE — Addendum Note (Signed)
Addendum  created 02/18/21 1324 by Hendricks Limes, CRNA   Intraprocedure Meds edited

## 2021-02-18 NOTE — Discharge Instructions (Addendum)
[facility] °Office Phone Number [phone] ° °BREAST BIOPSY/ PARTIAL MASTECTOMY: POST OP INSTRUCTIONS ° °Always review your discharge instruction sheet given to you by the facility where your surgery was performed. ° °IF YOU HAVE DISABILITY OR FAMILY LEAVE FORMS, YOU MUST BRING THEM TO THE OFFICE FOR PROCESSING.  DO NOT GIVE THEM TO YOUR DOCTOR. ° °1. A prescription for pain medication may be given to you upon discharge.  Take your pain medication as prescribed, if needed.  If narcotic pain medicine is not needed, then you may take acetaminophen (Tylenol) or ibuprofen (Advil) as needed. °2. Take your usually prescribed medications unless otherwise directed °3. If you need a refill on your pain medication, please contact your pharmacy.  They will contact our office to request authorization.  Prescriptions will not be filled after 5pm or on week-ends. °4. You should eat very light the first 24 hours after surgery, such as soup, crackers, pudding, etc.  Resume your normal diet the day after surgery. °5. Most patients will experience some swelling and bruising in the breast.  Ice packs and a good support bra will help.  Swelling and bruising can take several days to resolve.  °6. It is common to experience some constipation if taking pain medication after surgery.  Increasing fluid intake and taking a stool softener will usually help or prevent this problem from occurring.  A mild laxative (Milk of Magnesia or Miralax) should be taken according to package directions if there are no bowel movements after 48 hours. °7. Unless discharge instructions indicate otherwise, you may remove your bandages 48 hours after surgery, and you may shower at that time.  You may have steri-strips (small skin tapes) in place directly over the incision.  These strips should be left on the skin for 7-10 days.   Any sutures or staples will be removed at the office during your follow-up visit. °8. ACTIVITIES:  You may resume  regular daily activities (gradually increasing) beginning the next day.  Wearing a good support bra or sports bra (or the breast binder) minimizes pain and swelling.  You may have sexual intercourse when it is comfortable. °a. You may drive when you no longer are taking prescription pain medication, you can comfortably wear a seatbelt, and you can safely maneuver your car and apply brakes. °b. RETURN TO WORK:  __________1 week_______________ °9. You should see your doctor in the office for a follow-up appointment approximately two weeks after your surgery.  Your doctor’s nurse will typically make your follow-up appointment when she calls you with your pathology report.  Expect your pathology report 2-3 business days after your surgery.  You may call to check if you do not hear from us after three days. ° ° °WHEN TO CALL YOUR DOCTOR: °1. Fever over 101.0 °2. Nausea and/or vomiting. °3. Extreme swelling or bruising. °4. Continued bleeding from incision. °5. Increased pain, redness, or drainage from the incision. ° °The clinic staff is available to answer your questions during regular business hours.  Please don’t hesitate to call and ask to speak to one of the nurses for clinical concerns.  If you have a medical emergency, go to the nearest emergency room or call 911.  A surgeon from [facility] is always on call at the hospital. ° °For further questions, please visit [email or web address]  ° °

## 2021-02-18 NOTE — Anesthesia Procedure Notes (Signed)
Anesthesia Regional Block: Pectoralis block   Pre-Anesthetic Checklist: ,, timeout performed, Correct Patient, Correct Site, Correct Laterality, Correct Procedure, Correct Position, site marked, Risks and benefits discussed,  Surgical consent,  Pre-op evaluation,  At surgeon's request and post-op pain management  Laterality: Right  Prep: chloraprep       Needles:  Injection technique: Single-shot  Needle Type: Echogenic Stimulator Needle     Needle Length: 10cm  Needle Gauge: 20     Additional Needles:   Procedures:,,,, ultrasound used (permanent image in chart),,,,  Narrative:  Start time: 02/18/2021 7:05 AM End time: 02/18/2021 7:12 AM Injection made incrementally with aspirations every 5 mL.  Performed by: Personally  Anesthesiologist: Lidia Collum, MD  Additional Notes: Standard monitors applied. Skin prepped. Good needle visualization with ultrasound. Injection made in 5cc increments with no resistance to injection. Patient tolerated the procedure well.

## 2021-02-18 NOTE — H&P (Signed)
Zorita Pang Location: [facility] Patient #: [id number] DOB: [birth year] Undefined / Language: [name] / Race: White Female   History of Present Illness  The patient is a 70 year old female who presents with breast cancer.Pt is a lovely 70 yo F who is referred by Dr. Luan Pulling for a new diagnosis of right breast cancer 11/2020. She presented with a screening detected mass. Diagnostic imaging showed a 9 mm mass in the upper outer quadrant. Axillary u/s negative for lymphadenopathy. 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   She has no family breast cancer history, but her mother had melanoma. She has no prior cancers before this. She is a G2P2 with first child in the early 22s. She had hysterectomy in the early 53s.     diagnostic imaging from [facility], images and reports reviewed today in multidisciplinary fashion. Breast density is B  pathology 11/22/2020 Breast, right, needle core biopsy, 10 cmfn, upper outer quadrant, post depth - INVASIVE DUCTAL CARCINOMA. Carcinoma measures 0.7 cm in greatest linear dimension and appears grade 2. The tumor cells are NEGATIVE for Her2 (1+). Estrogen Receptor: 95%, POSITIVE, STRONG STAINING INTENSITY Progesterone Receptor: 95%, POSITIVE, STRONG STAINING INTENSITY Proliferation Marker Ki67: 10%  Labs 12/05/2020 CMET essentially normal other than mildly elevated AST at 42 (ULN 41), CBC wtih HCT 34.9.    Past Surgical History Appendectomy  Breast Biopsy  Right. Cesarean Section - Multiple  Gallbladder Surgery - Laparoscopic  Hysterectomy (not due to cancer) - Complete  Oral Surgery  Shoulder Surgery  Bilateral.  Diagnostic Studies History  Colonoscopy  1-5 years ago Mammogram  within last year Pap Smear  1-5 years ago  Medication History  Medications Reconciled  Social History Caffeine use  Coffee. No alcohol use  No drug use   Tobacco use  Never smoker.  Family History Anesthetic complications  Mother. Arthritis  Mother. Cancer  Mother. Colon Cancer  Father. Diabetes Mellitus  Mother, Sister. Heart Disease  Father, Mother. Heart disease in female family member before age 62  Heart disease in female family member before age 61  Hypertension  Brother, Father, Mother, Sister. Melanoma  Mother. Respiratory Condition  Mother.  Pregnancy / Birth History  Age at menarche  32 years. Age of menopause  <45 Contraceptive History  Oral contraceptives. Gravida  2 Irregular periods  Length (months) of breastfeeding  3-6 Maternal age  45-25 Para  2  Other Problems Asthma  Back Pain  Cholelithiasis  Diabetes Mellitus  Gastroesophageal Reflux Disease  High blood pressure  Hypercholesterolemia  Melanoma  Migraine Headache  Transfusion history  Ventral Hernia Repair     Review of Systems  General Not Present- Appetite Loss, Chills, Fatigue, Fever, Night Sweats, Weight Gain and Weight Loss. Skin Present- Dryness. Not Present- Change in Wart/Mole, Hives, Jaundice, New Lesions, Non-Healing Wounds, Rash and Ulcer. HEENT Present- Hoarseness, Seasonal Allergies and Wears glasses/contact lenses. Not Present- Earache, Hearing Loss, Nose Bleed, Oral Ulcers, Ringing in the Ears, Sinus Pain, Sore Throat, Visual Disturbances and Yellow Eyes. Respiratory Present- Snoring. Not Present- Bloody sputum, Chronic Cough, Difficulty Breathing and Wheezing. Breast Not Present- Breast Mass, Breast Pain, Nipple Discharge and Skin Changes. Cardiovascular Present- Shortness of Breath. Not Present- Chest Pain, Difficulty Breathing Lying Down, Leg Cramps, Palpitations, Rapid Heart Rate and Swelling of Extremities. Gastrointestinal Present- Change in Bowel Habits, Chronic diarrhea and Indigestion. Not Present- Abdominal Pain, Bloating, Bloody Stool, Constipation, Difficulty Swallowing,  Excessive gas, Gets  full quickly at meals, Hemorrhoids, Nausea, Rectal Pain and Vomiting. Female Genitourinary Not Present- Frequency, Nocturia, Painful Urination, Pelvic Pain and Urgency. Musculoskeletal Present- Joint Pain, Joint Stiffness and Muscle Pain. Not Present- Back Pain, Muscle Weakness and Swelling of Extremities. Neurological Present- Headaches and Trouble walking. Not Present- Decreased Memory, Fainting, Numbness, Seizures, Tingling, Tremor and Weakness. Psychiatric Not Present- Anxiety, Bipolar, Change in Sleep Pattern, Depression, Fearful and Frequent crying. Endocrine Present- Heat Intolerance and Hot flashes. Not Present- Cold Intolerance, Excessive Hunger, Hair Changes and New Diabetes. Hematology Present- Blood Thinners. Not Present- Easy Bruising, Excessive bleeding, Gland problems, HIV and Persistent Infections.   Physical Exam  General Mental Status-Alert. General Appearance-Consistent with stated age. Hydration-Well hydrated. Voice-Normal.  Head and Neck Head-normocephalic, atraumatic with no lesions or palpable masses. Trachea-midline. Thyroid Gland Characteristics - normal size and consistency.  Eye Eyeball - Bilateral-Extraocular movements intact. Sclera/Conjunctiva - Bilateral-No scleral icterus.  Chest and Lung Exam Chest and lung exam reveals -quiet, even and easy respiratory effort with no use of accessory muscles and on auscultation, normal breath sounds, no adventitious sounds and normal vocal resonance. Inspection Chest Wall - Normal. Back - normal.  Breast Note: breasts are relatively symmetric. No palpable masses are present. No LAD. no nipple retraction or nipple discharge. no skin dimpling. some ptosis present. tender at right biopsy site.   Cardiovascular Cardiovascular examination reveals -normal heart sounds, regular rate and rhythm with no murmurs and normal pedal pulses bilaterally.  Abdomen Inspection Inspection of the abdomen  reveals - No Hernias. Palpation/Percussion Palpation and Percussion of the abdomen reveal - Soft, Non Tender, No Rebound tenderness, No Rigidity (guarding) and No hepatosplenomegaly. Auscultation Auscultation of the abdomen reveals - Bowel sounds normal.  Neurologic Neurologic evaluation reveals -alert and oriented x 3 with no impairment of recent or remote memory. Mental Status-Normal.  Musculoskeletal Global Assessment -Note: no gross deformities, but left shoulder has very limited mobility due to prior injury.  Normal Exam - Left-Upper Extremity Strength Normal and Lower Extremity Strength Normal. Normal Exam - Right-Upper Extremity Strength Normal and Lower Extremity Strength Normal.  Lymphatic Head & Neck  General Head & Neck Lymphatics: Bilateral - Description - Normal. Axillary  General Axillary Region: Bilateral - Description - Normal. Tenderness - Non Tender. Femoral & Inguinal  Generalized Femoral & Inguinal Lymphatics: Bilateral - Description - No Generalized lymphadenopathy.    Assessment & Plan MALIGNANT NEOPLASM OF UPPER-OUTER QUADRANT OF RIGHT BREAST IN FEMALE, ESTROGEN RECEPTOR POSITIVE (C50.411) Impression: Pt has a new diagnosis of cT1bN0 right breast cancer. I discussed breast conservation vs mastectomy with patient as well as rationale for sentinel lymph node biopsy. We will plan breast conservation.  This would be followed by radiation if breast conservation. Also, patient would have antiestrogen treatment. Oncotype might be sent depending on final size of tumor.  The surgical procedure was described to the patient. I discussed the incision type and location and that we would need radiology involved on with a wire or seed marker and/or sentinel node.  The risks and benefits of the procedure were described to the patient and she wishes to proceed.  We discussed the risks bleeding, infection, damage to other structures, need for further  procedures/surgeries. We discussed the risk of seroma. The patient was advised if the area in the breast in cancer, we may need to go back to surgery for additional tissue to obtain negative margins or for a lymph node biopsy. The patient was advised that these are the  most common complications, but that others can occur as well. They were advised against taking aspirin or other anti-inflammatory agents/blood thinners the week before surgery. Current Plans You are being scheduled for surgery- Our schedulers will call you.  You should hear from our office's scheduling department within 5 working days about the location, date, and time of surgery. We try to make accommodations for patient's preferences in scheduling surgery, but sometimes the OR schedule or the surgeon's schedule prevents [place] from making those accommodations.  If you have not heard from our office [phone]) in 5 working days, call the office and ask for your surgeon's nurse.  If you have other questions about your diagnosis, plan, or surgery, call the office and ask for your surgeon's nurse.  Pt Education - flb breast cancer surgery: discussed with patient and provided information.

## 2021-02-19 ENCOUNTER — Encounter (HOSPITAL_COMMUNITY): Payer: Self-pay | Admitting: General Surgery

## 2021-02-19 LAB — SURGICAL PATHOLOGY

## 2021-02-21 ENCOUNTER — Telehealth: Payer: Self-pay | Admitting: *Deleted

## 2021-02-21 ENCOUNTER — Encounter: Payer: Self-pay | Admitting: *Deleted

## 2021-02-21 NOTE — Telephone Encounter (Signed)
Received order for oncotype testing. Requisition faxed to pathology and [facility] °

## 2021-03-05 ENCOUNTER — Encounter (HOSPITAL_COMMUNITY): Payer: Self-pay

## 2021-03-06 ENCOUNTER — Telehealth: Payer: Self-pay | Admitting: *Deleted

## 2021-03-06 ENCOUNTER — Encounter: Payer: Self-pay | Admitting: *Deleted

## 2021-03-06 DIAGNOSIS — C50411 Malignant neoplasm of upper-outer quadrant of right female breast: Secondary | ICD-10-CM

## 2021-03-06 DIAGNOSIS — Z170 Estrogen receptor positive status [ER+]: Secondary | ICD-10-CM

## 2021-03-06 NOTE — Telephone Encounter (Signed)
Received oncotype results of 0/3%.  Left voicemail for patient to return my call to give results. Referral placed for xrt.

## 2021-03-11 ENCOUNTER — Encounter: Payer: Self-pay | Admitting: *Deleted

## 2021-03-14 ENCOUNTER — Other Ambulatory Visit: Payer: Self-pay

## 2021-03-14 ENCOUNTER — Ambulatory Visit: Payer: Medicare Other | Attending: Hematology | Admitting: Physical Therapy

## 2021-03-14 ENCOUNTER — Encounter: Payer: Self-pay | Admitting: Physical Therapy

## 2021-03-14 DIAGNOSIS — R293 Abnormal posture: Secondary | ICD-10-CM | POA: Diagnosis not present

## 2021-03-14 DIAGNOSIS — Z483 Aftercare following surgery for neoplasm: Secondary | ICD-10-CM | POA: Diagnosis not present

## 2021-03-14 DIAGNOSIS — C50411 Malignant neoplasm of upper-outer quadrant of right female breast: Secondary | ICD-10-CM | POA: Diagnosis not present

## 2021-03-14 DIAGNOSIS — Z170 Estrogen receptor positive status [ER+]: Secondary | ICD-10-CM | POA: Insufficient documentation

## 2021-03-14 NOTE — Therapy (Signed)
[place], [place], [address] Phone: [phone]   Fax:  [phone]  Physical Therapy Treatment  Patient Details  Name: ZADIA UHDE MRN: [id number] Date of Birth: 05/15/1951 Referring Provider (PT): Dr. [name]   Encounter Date: 03/14/2021   PT End of Session - 03/14/21 1046    Visit Number 2    Number of Visits 2    PT Start Time 0957    PT Stop Time 1045    PT Time Calculation (min) 48 min    Activity Tolerance Patient tolerated treatment well    Behavior During Therapy [facility] for tasks assessed/performed           Past Medical History:  Diagnosis Date  . Anemia   . Arthritis   . Asthma   . Diabetes ([place])   . Dyspnea   . Dysrhythmia    afibb on occasion  . Family history of adverse reaction to anesthesia    mother had reaction to versed...during surgery, stopped breathing  . Fibromyalgia   . GERD (gastroesophageal reflux disease)   . Headache   . History of hiatal hernia   . Hypertension   . Osteopenia   . Psoriasis     Past Surgical History:  Procedure Laterality Date  . ABDOMINAL HYSTERECTOMY    . APPENDECTOMY    . BREAST LUMPECTOMY WITH RADIOACTIVE SEED AND SENTINEL LYMPH NODE BIOPSY Right 02/18/2021   Procedure: RIGHT BREAST LUMPECTOMY WITH RADIOACTIVE SEED AND SENTINEL LYMPH NODE BIOPSY;  Surgeon: [name], MD;  Location: [facility];  Service: General;  Laterality: Right;  . broken wrist    . [place] and 1977  . CHOLECYSTECTOMY    . FRACTURE SURGERY     right wrist  . HYSTERECTOMY ABDOMINAL WITH SALPINGO-OOPHORECTOMY    . LYSIS OF ADHESION    . SHOULDER SURGERY Left   . TUBAL LIGATION      There were no vitals filed for this visit.   Subjective Assessment - 03/14/21 1004    Subjective Patient reports she underwent a right lumpectomy and sentinel node biopsy (5 negative nodes) on 02/18/2021. Her Oncotype score was 0 (zero) so no chemo is needed. She  will undergo radiation.    Pertinent History Patient was diagnosed on 11/22/2020 with right invasive ductal carcinoma breast cancer. She underwent a right lumpectomy and sentinel node biopsy (5 negative nodes) on 02/18/2021. It is ER/PR positive and HER2 negative with a Ki67 of 10%. She has many orthopedic problems from a traumatic fall and other accidents.    Patient Stated Goals See how my arm is doing    Currently in Pain? Yes    Pain Score 3     Pain Location Axilla    Pain Orientation Right    Pain Descriptors / Indicators Numbness    Pain Type Surgical pain    Pain Onset 1 to 4 weeks ago    Pain Frequency Intermittent    Aggravating Factors  nothing    Pain Relieving Factors nothing              [facility] PT Assessment - 03/14/21 0001      Assessment   Medical Diagnosis s/p right lumpectomy and SLNB    Referring Provider (PT) Dr. [name]    Onset Date/Surgical Date 02/18/21    Hand Dominance Right    Prior Therapy Baseline      Precautions   Precautions Other (comment)  Precaution Comments recent surgery      Restrictions   Weight Bearing Restrictions No      Balance Screen   Has the patient fallen in the past 6 months No    Has the patient had a decrease in activity level because of a fear of falling?  No    Is the patient reluctant to leave their home because of a fear of falling?  No      Home Ecologist residence    Living Arrangements Spouse/significant other    Available Help at Discharge Family      Prior Function   Level of Independence Independent    Vocation Part time employment    Vocation Requirements Sign language interpreter    Leisure She is not exercising      Cognition   Overall Cognitive Status Within Functional Limits for tasks assessed      Observation/Other Assessments   Observations Right axilary incision appears to be well healed with 1 stitch sticking out (clipped with cleaned clippers slightly to reduce  her feeling of it poking her). No redness or edema noted.      Posture/Postural Control   Posture/Postural Control Postural limitations    Postural Limitations Rounded Shoulders;Forward head      ROM / Strength   AROM / PROM / Strength AROM      AROM   AROM Assessment Site Shoulder    Right/Left Shoulder Right    Right Shoulder Extension 50 Degrees    Right Shoulder Flexion 133 Degrees    Right Shoulder ABduction 126 Degrees    Right Shoulder Internal Rotation 46 Degrees    Right Shoulder External Rotation 83 Degrees      Strength   Overall Strength Unable to assess;Due to precautions             LYMPHEDEMA/ONCOLOGY QUESTIONNAIRE - 03/14/21 0001      Type   Cancer Type Right breast cancer      Surgeries   Lumpectomy Date 02/18/21    Sentinel Lymph Node Biopsy Date 02/18/21    Number Lymph Nodes Removed 5      Treatment   Active Chemotherapy Treatment No    Past Chemotherapy Treatment No    Active Radiation Treatment No    Past Radiation Treatment No    Current Hormone Treatment No    Past Hormone Therapy No      What other symptoms do you have   Are you Having Heaviness or Tightness No    Are you having Pain No    Are you having pitting edema No    Is it Hard or Difficult finding clothes that fit No    Do you have infections No    Is there Decreased scar mobility No    [place] Sign No      Lymphedema Assessments   Lymphedema Assessments Upper extremities      Right Upper Extremity Lymphedema   10 cm Proximal to Olecranon Process 36.3 cm    Olecranon Process 27.2 cm    10 cm Proximal to Ulnar Styloid Process 24.1 cm    Just Proximal to Ulnar Styloid Process 16.8 cm    Across Hand at [employer] 18.2 cm    At [place] of 2nd Digit 6.5 cm      Left Upper Extremity Lymphedema   10 cm Proximal to Olecranon Process 37.5 cm    Olecranon Process 27.9 cm    10  cm Proximal to Ulnar Styloid Process 25.2 cm    Just Proximal to Ulnar Styloid Process 17.4 cm     Across Hand at [employer] 17.3 cm    At [place] of 2nd Digit 6.2 cm              Quick Dash - 03/14/21 0001    Open a tight or new jar Unable    Do heavy household chores (wash walls, wash floors) Unable    Carry a shopping bag or briefcase Mild difficulty    Wash your back Mild difficulty    Use a knife to cut food No difficulty    Recreational activities in which you take some force or impact through your arm, shoulder, or hand (golf, hammering, tennis) Unable    During the past week, to what extent has your arm, shoulder or hand problem interfered with your normal social activities with family, friends, neighbors, or groups? Not at all    During the past week, to what extent has your arm, shoulder or hand problem limited your work or other regular daily activities Slightly    Arm, shoulder, or hand pain. Mild    Tingling (pins and needles) in your arm, shoulder, or hand Mild    Difficulty Sleeping Mild difficulty    DASH Score 40.91 %                          PT Education - 03/14/21 1038    Education Details Lymphedema risk reduction and aftercare; scar massage    Person(s) Educated Patient    Methods Explanation    Comprehension Verbalized understanding               PT Long Term Goals - 03/14/21 1053      PT LONG TERM GOAL #1   Title Patient will demonstrate she has regained full shoulder ROM and function post operatively compared to baselines.    Time 8    Period Weeks    Status Achieved                 Plan - 03/14/21 1046    Clinical Impression Statement Patient is diong well s/p right lumpectomy and sentinel node biopsy on 02/18/2021. She had 5 negative axillary nodes removed and is doing well. She has 1 incision which appears to be well healed, has regained shoulder ROM (which is limited due to pre-existing orthopedic issues), and has no early signs of lymphedema. She plans to attend the After Breast Cancer class on 03/18/2021 for  lymphedema education and will be seen every 3 months for SOZO screens to detect subclinical lymphedema.    PT Treatment/Interventions ADLs/Self Care Home Management;Therapeutic exercise;Patient/family education    PT Next Visit Plan D/C    PT Home Exercise Plan Post op shoulder ROM HEP    Consulted and Agree with Plan of Care Patient           Patient will benefit from skilled therapeutic intervention in order to improve the following deficits and impairments:  Postural dysfunction,Decreased range of motion,Decreased knowledge of precautions,Impaired UE functional use,Pain  Visit Diagnosis: Malignant neoplasm of upper-outer quadrant of right breast in female, estrogen receptor positive ([place])  Abnormal posture  Aftercare following surgery for neoplasm     Problem List Patient Active Problem List   Diagnosis Date Noted  . Genetic testing 12/26/2020  . Malignant neoplasm of upper-outer quadrant of right breast in female, estrogen  receptor positive ([place]) 11/28/2020    PHYSICAL THERAPY DISCHARGE SUMMARY  Visits from Start of Care: 2  Current functional level related to goals / functional outcomes: Goals met. See above for objective measurements.   Remaining deficits: None   Education / Equipment: HEP and lymphedema education Plan: Patient agrees to discharge.  Patient goals were met. Patient is being discharged due to meeting the stated rehab goals.  ?????        Annia Friendly, [place] 03/14/21 10:54 AM  [place], [place], [address] Phone: [phone]   Fax:  [phone]  Name: DOLLYE GLASSER MRN: [id number] Date of Birth: [birth year]

## 2021-03-14 NOTE — Patient Instructions (Addendum)
            [facility]         [address], [place] [address]         [phone]         Annia Friendly, PT, CLT   After Breast Cancer Class It is recommended you attend the ABC class to be educated on lymphedema risk reduction. This class is free of charge and lasts for 1 hour. It is a 1-time class.  You are scheduled for 03/18/2021 at 11:00. We will send you a Webex link.  Scar massage You can begin gentle scar massage to your incision using coconut oil or Vitamin E cream a few minutes each day.   Home exercise Program Continue doing your home exercises through the end of radiation at least. And begin walking or moving more :)   Follow up PT: It is recommended you return every 3 months for the first 3 years following surgery to be assessed on the SOZO machine for an L-Dex score. This helps prevent clinically significant lymphedema in 95% of patients. These follow up screens are 15 minute appointments that you are not billed for. You are scheduled for July 11th at 11:00. This is a quick appointment and it will be at our new clinic at [place]. [place] [phone].   Cane Exercise: Abduction    Hold cane with right hand over end, palm-up, with other hand palm-down. Move arm out from side and up by pushing with other arm. Hold __3__ seconds. Repeat __5__ times. Do __2__ sessions per day.  http://gt2.exer.us/82   Copyright  VHI. All rights reserved.

## 2021-03-25 NOTE — Progress Notes (Signed)
Cardiology Office Note:    Date:  03/28/2021   ID:  Kayla Bennett, DOB [birth year], MRN [id number]  PCP:  [name], MD  [facility] Cardiologist:  None  [facility] Electrophysiologist:  None   Referring MD: [name], MD    History of Present Illness:    Kayla Bennett is a 70 y.o. female with a hx of newly diagnosed right breast cancer, DMII, HTN, asthma, and fibromyalgia, and newly diagnosed Afib who presents to clinic for follow-up  The patient was seen at a pre-operative visit prior to right sided lumpectomy and lymph node dissection in 12/2020 where she was incidentally discovered to be in atrial fibrillation. She had noticed some intermittent shortness of breath but was otherwise relatively asymptomatic. She was initially seen in clinic on 12/27/20 for pre-operative evaluation. Given limited mobility, we obtained myoview on 12/27/20 which was normal with no ischemia or infarction, EF 74%. TTE 12/21/20 with LVEF 50-55%, moderate LAE, moderate MR. She was cleared for her lumpectomy. She now presents to clinic for follow-up.   Today, the patient states her procedure went well. She is planning on starting radiation therapy on 04/09/21. Overall, she feels fatigued which is chronic but no significant palpitations. Has occasional shortness of breath. No chest pain, LE edema, orthopnea, or PND. Blood pressure is well controlled currently. Has been on eliquis since 02/20/21. Has not missed doses.   Family history notable for father with massive MI at age 4, paternal GM with MI, Uncles with MI and history of CABG. Mother with MI, atrial fibrillation, pulmonary fibrosis/PH. Maternal aunt with atrial fibrillation. Cousin with atrial fibrillation. Maternal GM: CVA and CHF  Past Medical History:  Diagnosis Date  . Anemia   . Arthritis   . Asthma   . Diabetes ([place])   . Dyspnea   . Dysrhythmia    afibb on occasion  . Family history of adverse reaction to  anesthesia    mother had reaction to versed...during surgery, stopped breathing  . Fibromyalgia   . GERD (gastroesophageal reflux disease)   . Headache   . History of hiatal hernia   . Hypertension   . Osteopenia   . Psoriasis     Past Surgical History:  Procedure Laterality Date  . ABDOMINAL HYSTERECTOMY    . APPENDECTOMY    . BREAST LUMPECTOMY WITH RADIOACTIVE SEED AND SENTINEL LYMPH NODE BIOPSY Right 02/18/2021   Procedure: RIGHT BREAST LUMPECTOMY WITH RADIOACTIVE SEED AND SENTINEL LYMPH NODE BIOPSY;  Surgeon: [name], MD;  Location: [place];  Service: General;  Laterality: Right;  . broken wrist    . [place] and 1977  . CHOLECYSTECTOMY    . FRACTURE SURGERY     right wrist  . HYSTERECTOMY ABDOMINAL WITH SALPINGO-OOPHORECTOMY    . LYSIS OF ADHESION    . SHOULDER SURGERY Left   . TUBAL LIGATION      Current Medications: Current Meds  Medication Sig  . acetaminophen (TYLENOL) 500 MG tablet Take 500 mg by mouth every 8 (eight) hours as needed for moderate pain.  Marland Kitchen albuterol (VENTOLIN HFA) 108 (90 Base) MCG/ACT inhaler Inhale 2 puffs into the lungs every 6 (six) hours as needed for wheezing or shortness of breath.  . Alirocumab (PRALUENT) 150 MG/ML SOAJ Inject 150 mg into the skin every 14 (fourteen) days.  Marland Kitchen apixaban (ELIQUIS) 5 MG TABS tablet Take 1 tablet (5 mg total) by mouth 2 (two) times daily.  . cetirizine (ZYRTEC) 10  MG tablet Take 10 mg by mouth daily.  . cholecalciferol (VITAMIN D3) 25 MCG (1000 UNIT) tablet Take 2,000 Units by mouth daily.  Marland Kitchen gabapentin (NEURONTIN) 100 MG capsule Take 200 mg by mouth at bedtime.  Marland Kitchen ibuprofen (ADVIL) 200 MG tablet Take 400 mg by mouth every 8 (eight) hours as needed for moderate pain.  Marland Kitchen losartan-hydrochlorothiazide (HYZAAR) 100-25 MG tablet Take 1 tablet by mouth daily.  . metFORMIN (GLUCOPHAGE-XR) 500 MG 24 hr tablet Take 500 mg by mouth at bedtime.  . pantoprazole (PROTONIX) 40 MG tablet Take 40 mg by mouth 2  (two) times daily before a meal.  . Prenatal Vit-Fe Fumarate-FA (PRENATAL VITAMIN PLUS LOW IRON PO) Take 1 tablet by mouth daily.  . SUMAtriptan (IMITREX) 50 MG tablet Take 50 mg by mouth every 2 (two) hours as needed for migraine.  . triamcinolone (KENALOG) 0.1 % Apply 1 application topically 2 (two) times daily as needed (psoriasis). Mixed with Cetaphil  . venlafaxine XR (EFFEXOR-XR) 37.5 MG 24 hr capsule Take 37.5 mg by mouth daily.  Marland Kitchen venlafaxine XR (EFFEXOR-XR) 75 MG 24 hr capsule Take 75 mg by mouth daily. Patient reports she is taking 1.5 doses per day per instruction from Dr. Dyane Dustman     Allergies:   Lotensin [benazepril hcl], Morphine, Erythromycin base, Hydrocodone, and Wound dressing adhesive   Social History   Socioeconomic History  . Marital status: Married    Spouse name: Not on file  . Number of children: 2  . Years of education: Not on file  . Highest education level: Not on file  Occupational History  . Not on file  Tobacco Use  . Smoking status: Never Smoker  . Smokeless tobacco: Never Used  Vaping Use  . Vaping Use: Never used  Substance and Sexual Activity  . Alcohol use: Never  . Drug use: Never  . Sexual activity: Not on file  Other Topics Concern  . Not on file  Social History Narrative  . Not on file   Social Determinants of Health   Financial Resource Strain: Not on file  Food Insecurity: Not on file  Transportation Needs: Not on file  Physical Activity: Not on file  Stress: Not on file  Social Connections: Not on file     Family History: The patient's family history includes Cancer in her maternal uncle; Colon cancer in her cousin and paternal uncle; Colon cancer (age of onset: 50) in her father.  ROS:   Please see the history of present illness.    Review of Systems  Constitutional: Positive for malaise/fatigue. Negative for chills and fever.  HENT: Negative for hearing loss.   Eyes: Negative for blurred vision.  Respiratory:  Positive for shortness of breath.   Cardiovascular: Negative for chest pain, palpitations, orthopnea, claudication, leg swelling and PND.  Gastrointestinal: Negative for blood in stool and melena.  Genitourinary: Negative for flank pain and hematuria.  Musculoskeletal: Positive for back pain, joint pain and myalgias. Negative for falls.  Neurological: Negative for dizziness, sensory change and loss of consciousness.  Psychiatric/Behavioral: Negative for substance abuse.    EKGs/Labs/Other Studies Reviewed:    The following studies were reviewed today:  Myoview 12/27/20:  The left ventricular ejection fraction is hyperdynamic (>65%).  Nuclear stress EF: 74%.  There was no ST segment deviation noted during stress.  The study is normal.  This is a low risk study.   Normal pharmacologic nuclear stress test with no evidence for prior infarct or ischemia. Normal  LVEF.  TTE 12/21/20: IMPRESSIONS  1. Left ventricular ejection fraction, by estimation, is 50 to 55%. The  left ventricle has low normal function. The left ventricle has no regional  wall motion abnormalities. Left ventricular diastolic parameters are  indeterminate.  2. Right ventricular systolic function is normal. The right ventricular  size is not well visualized.  3. Left atrial size was moderately dilated.  4. The mitral valve is normal in structure. At most moderate mitral valve  regurgitation with variability in beat to beat assessment. Pulmonary vein  systolic blunting. 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 No evidence of mitral  stenosis.  5. The aortic valve is normal in structure. Aortic valve regurgitation is  not visualized. No aortic stenosis is present.  6. The inferior vena cava is normal in size with greater than 50%  respiratory variability, suggesting right atrial pressure of 3 mmHg.    EKG:  EKG 12/18/20: Rate controlled Afib with HR 69  Recent Labs: 12/05/2020:  ALT 32 02/13/2021: BUN 13; Creatinine, Ser 0.81; Hemoglobin 10.2; Platelets 268; Potassium 3.9; Sodium 137  Recent Lipid Panel No results found for: CHOL, TRIG, HDL, CHOLHDL, VLDL, LDLCALC, LDLDIRECT   Risk Assessment/Calculations:    CHA2DS2-VASc Score = 4  This indicates a 4.8% annual risk of stroke. The patient's score is based upon: CHF History: No HTN History: Yes Diabetes History: Yes Stroke History: No Vascular Disease History: No Age Score: 1 Gender Score: 1     Physical Exam:    VS:  BP 132/78   Pulse 78   Ht 5\' 2"  (1.575 m)   Wt 208 lb 9.6 oz (94.6 kg)   SpO2 95%   BMI 38.15 kg/m     Wt Readings from Last 3 Encounters:  03/28/21 208 lb 9.6 oz (94.6 kg)  03/26/21 208 lb 2 oz (94.4 kg)  02/18/21 206 lb (93.4 kg)     GEN:  Comfortable, NAD HEENT: Normal NECK: No JVD; No carotid bruits CARDIAC: Irregularly irregular, no murmurs, rubs, gallops RESPIRATORY:  CTAB, no wheezes ABDOMEN: Soft, non-tender, non-distended MUSCULOSKELETAL:  No edema; No deformity  SKIN: Warm and dry NEUROLOGIC:  Alert and oriented x 3 PSYCHIATRIC:  Normal affect   ASSESSMENT:    1. Atrial fibrillation, unspecified type ([place])   2. Pre-procedure lab exam   3. Encounter for laboratory examination   4. Dyspnea on exertion   5. Primary hypertension   6. Type 2 diabetes mellitus without complication, without long-term current use of insulin (HCC)   7. Malignant neoplasm of upper-outer quadrant of right female breast, unspecified estrogen receptor status ([place])   8. Fibromyalgia    PLAN:    In order of problems listed above:  #Newly Diagnosed Atrial Fibrillation: Incidentally detected on pre-operative ECG. Patient is relatively asymptomatic with no palpitations, SOB, lightheadedness or chest pain. Has chronic fatigue at baseline which she attributes to fibromyalgia and occasional episodes of SOB. CHADS-vasc 4. HR currently well controlled in the 60s. Remains in Afib today. -Plan  for DCCV; no missed doses of apixaban -Continue apixaban 5mg  BID -Stopped ASA given need for [facility] -TTE with normal LVEF, moderate MR, moderate LAE -Discussed sleep study today; would like to defer until after XRT   #HTN: Blood pressure well controlled -Continue losartan-HCTZ 100-25mg  daily; refill as needed  #DMII:  Managed by PCP. A1C 6.6. -Continue metformin 500mg  daily  #Newly diagnosed right breast cancer: Followed by Dr. Isidore Moos with Oncology. -S/p right breast lumpectomy and lymph node resection -  Plan to start radiation therapy at the end of May  #HLD: Total 137, LDL 47, HDL 70. -On praulent 150mg  q14days; refill as needed  #Fibromyalgia: #Chronic Pain: -Management per PCP    Medication Adjustments/Labs and Tests Ordered: Current medicines are reviewed at length with the patient today.  Concerns regarding medicines are outlined above.  Orders Placed This Encounter  Procedures  . CBC  . Basic metabolic panel  . EKG 12-Lead   No orders of the defined types were placed in this encounter.   Patient Instructions  Medication Instructions:   Your physician recommends that you continue on your current medications as directed. Please refer to the Current Medication list given to you today.  *If you need a refill on your cardiac medications before your next appointment, please call your pharmacy*   Lab Work:  TODAY-PRE-PROCEDURE LAB--CBC AND BMET  If you have labs (blood work) drawn today and your tests are completely normal, you will receive your results only by: Marland Kitchen MyChart Message (if you have MyChart) OR . A paper copy in the mail If you have any lab test that is abnormal or we need to change your treatment, we will call you to review the results.   Testing/Procedures:  Dear Selena Batten  You are scheduled for a  Cardioversion on Thursday 04/04/21 with Dr. Radford Pax.  Please arrive at the [facility] (Main Entrance A) at [place] [address]: [place], [place] [address] at 8:30 am. (1 hour prior to procedure unless lab work is needed; if lab work is needed arrive 1.5 hours ahead)  DIET: Nothing to eat or drink after midnight except a sip of water with medications (see medication instructions below)  Medication Instructions: Hold [place] your anticoagulant: ELIQUIS You will need to continue your anticoagulant after your procedure until you are told by your Provider that it is safe to stop   Labs: If patient is on Coumadin, patient needs pt/INR, CBC, BMET within 3 days (No pt/INR needed for patients taking Xarelto, Eliquis, Pradaxa) For patients receiving anesthesia for TEE and all Cardioversion patients: BMET, CBC within 1 week  Come to LAB: TODAY AT OUR OFFICE--CBC AND BMET  COVID TEST Due to recent COVID-19 restrictions implemented by our local and state authorities and in an effort to keep both patients and staff as safe as possible, our hospital system requires COVID-19 testing prior to certain scheduled hospital procedures.  Please go to [place]. [place], [place] [address] on 04/01/21 at 3:10 PM  .  This is a drive up testing site.  You will not need to exit your vehicle.  You will not be billed at the time of testing but may receive a bill later depending on your insurance.  The approximate cost of the test is $100.  You must agree to self-quarantine from the time of your testing until the procedure date on 04/04/21.  This should included staying home with ONLY the people you live with.  Avoid take-out, grocery store shopping or leaving the house for any non-emergent reason.  Failure to have your COVID-19 test done on the date and time you have been scheduled will result in cancellation of your procedure.  Please call our office at [phone] if you have any questions.   You must have a responsible person to drive you home and stay in the waiting area during your procedure.  Failure to do so could result in  cancellation.  Bring your insurance cards.  *Special Note: Every effort is made to have your procedure done on time. Occasionally there are emergencies that occur at the hospital that may cause delays. Please be patient if a delay does occur.     Follow-Up:  3 MONTHS IN THE OFFICE WITH EITHER DR. Johney Frame OR AN EXTENDER       Signed, Freada Bergeron, MD  03/28/2021 10:42 AM    [facility]

## 2021-03-25 NOTE — Progress Notes (Signed)
Location of Breast Cancer: Malignant neoplasm of upper-outer quadrant of RIGHT breast, estrogen receptor positive  Histology per Pathology Report:  02/18/2021 FINAL MICROSCOPIC DIAGNOSIS:  A. BREAST, RIGHT, LUMPECTOMY:  - Invasive ductal carcinoma, 1.2 cm, grade 2  - Ductal carcinoma in situ, intermediate grade  - Resection margins are negative for carcinoma; posterior margin is less than 1 mm from carcinoma  - Biopsy site changes  - See oncology table  B. LYMPH NODE, RIGHT AXILLARY #1, SENTINEL, EXCISION:  - Lymph node, negative for carcinoma (0/1)  C. LYMPH NODE, RIGHT AXILLARY #2, SENTINEL, EXCISION:  - Lymph node, negative for carcinoma (0/1)  D. LYMPH NODE, RIGHT AXILLARY, SENTINEL, EXCISION:  - Lymph node, negative for carcinoma (0/1)  E. LYMPH NODE, RIGHT AXILLARY #3, SENTINEL, EXCISION:  - Lymph node, negative for carcinoma (0/1)  F. LYMPH NODE, RIGHT AXILLARY, SENTINEL, EXCISION:  - Lymph node, negative for carcinoma (0/1)   Receptor Status: ER(95%), PR (95%), Her2-neu (Negative), Ki-67(10%)  Did patient present with symptoms (if so, please note symptoms) or was this found on screening mammography?:  A right breast mass was found by screening mammogram. She has had yearly mammogram and needed to monitor twice before. No biopsy before. She denies nipple, breast or skin changes.  Past/Anticipated interventions by surgeon, if any: 02/18/2021 Dr. [name] Right Breast Radioactive seed localized lumpectomy and sentinel lymph node biopsy  Past/Anticipated interventions by medical oncology, if any:  Under care of Dr. Truitt Merle 12/05/2020 PLAN:  -Genetic referral  -Proceed with surgery soon (done 02/18/2021) -Oncotype on her surgical sample (Per Varney Biles Martini-Breast Navigator: Received oncotype results of 0/3%") -F/u after radiation or sooner if Oncotype shows high risk disease  Lymphedema issues, if any:  Patient denies. Reports she had a PT evaluation and was told to  follow-up in July after she finishes radiation     Pain issues, if any: Reports mild residual tenderness near lumpectomy incision. She also deals with generalized discomfort due to her fibromyalgia   SAFETY ISSUES:  Prior radiation? No  Pacemaker/ICD? No  Possible current pregnancy? No--postmenopausal  Is the patient on methotrexate? No  Current Complaints / other details:  Reports her lumpectomy incision started weeping last Friday 03/22/21 (a brown/sticky fluid). States she called her surgeon's office and was told it was most like a seroma and nothing to be concerned about. She was instructed to massage the area and monitor for signs/symptoms of infection.

## 2021-03-25 NOTE — H&P (View-Only) (Signed)
Cardiology Office Note:    Date:  03/28/2021   ID:  Kayla Bennett, DOB 02/09/1951, MRN [id number]  PCP:  [name], MD  [facility] Cardiologist:  None  [facility] Electrophysiologist:  None   Referring MD: [name], MD    History of Present Illness:    Kayla Bennett is a 70 y.o. female with a hx of newly diagnosed right breast cancer, DMII, HTN, asthma, and fibromyalgia, and newly diagnosed Afib who presents to clinic for follow-up  The patient was seen at a pre-operative visit prior to right sided lumpectomy and lymph node dissection in 12/2020 where she was incidentally discovered to be in atrial fibrillation. She had noticed some intermittent shortness of breath but was otherwise relatively asymptomatic. She was initially seen in clinic on 12/27/20 for pre-operative evaluation. Given limited mobility, we obtained myoview on 12/27/20 which was normal with no ischemia or infarction, EF 74%. TTE 12/21/20 with LVEF 50-55%, moderate LAE, moderate MR. She was cleared for her lumpectomy. She now presents to clinic for follow-up.   Today, the patient states her procedure went well. She is planning on starting radiation therapy on 04/09/21. Overall, she feels fatigued which is chronic but no significant palpitations. Has occasional shortness of breath. No chest pain, LE edema, orthopnea, or PND. Blood pressure is well controlled currently. Has been on eliquis since 02/20/21. Has not missed doses.   Family history notable for father with massive MI at age 78, paternal GM with MI, Uncles with MI and history of CABG. Mother with MI, atrial fibrillation, pulmonary fibrosis/PH. Maternal aunt with atrial fibrillation. Cousin with atrial fibrillation. Maternal GM: CVA and CHF  Past Medical History:  Diagnosis Date  . Anemia   . Arthritis   . Asthma   . Diabetes ([place])   . Dyspnea   . Dysrhythmia    afibb on occasion  . Family history of adverse reaction to  anesthesia    mother had reaction to versed...during surgery, stopped breathing  . Fibromyalgia   . GERD (gastroesophageal reflux disease)   . Headache   . History of hiatal hernia   . Hypertension   . Osteopenia   . Psoriasis     Past Surgical History:  Procedure Laterality Date  . ABDOMINAL HYSTERECTOMY    . APPENDECTOMY    . BREAST LUMPECTOMY WITH RADIOACTIVE SEED AND SENTINEL LYMPH NODE BIOPSY Right 02/18/2021   Procedure: RIGHT BREAST LUMPECTOMY WITH RADIOACTIVE SEED AND SENTINEL LYMPH NODE BIOPSY;  Surgeon: [name], MD;  Location: [facility];  Service: General;  Laterality: Right;  . broken wrist    . [place] and 1977  . CHOLECYSTECTOMY    . FRACTURE SURGERY     right wrist  . HYSTERECTOMY ABDOMINAL WITH SALPINGO-OOPHORECTOMY    . LYSIS OF ADHESION    . SHOULDER SURGERY Left   . TUBAL LIGATION      Current Medications: Current Meds  Medication Sig  . acetaminophen (TYLENOL) 500 MG tablet Take 500 mg by mouth every 8 (eight) hours as needed for moderate pain.  Marland Kitchen albuterol (VENTOLIN HFA) 108 (90 Base) MCG/ACT inhaler Inhale 2 puffs into the lungs every 6 (six) hours as needed for wheezing or shortness of breath.  . Alirocumab (PRALUENT) 150 MG/ML SOAJ Inject 150 mg into the skin every 14 (fourteen) days.  Marland Kitchen apixaban (ELIQUIS) 5 MG TABS tablet Take 1 tablet (5 mg total) by mouth 2 (two) times daily.  . cetirizine (ZYRTEC) 10  MG tablet Take 10 mg by mouth daily.  . cholecalciferol (VITAMIN D3) 25 MCG (1000 UNIT) tablet Take 2,000 Units by mouth daily.  Marland Kitchen gabapentin (NEURONTIN) 100 MG capsule Take 200 mg by mouth at bedtime.  Marland Kitchen ibuprofen (ADVIL) 200 MG tablet Take 400 mg by mouth every 8 (eight) hours as needed for moderate pain.  Marland Kitchen losartan-hydrochlorothiazide (HYZAAR) 100-25 MG tablet Take 1 tablet by mouth daily.  . metFORMIN (GLUCOPHAGE-XR) 500 MG 24 hr tablet Take 500 mg by mouth at bedtime.  . pantoprazole (PROTONIX) 40 MG tablet Take 40 mg by mouth 2  (two) times daily before a meal.  . Prenatal Vit-Fe Fumarate-FA (PRENATAL VITAMIN PLUS LOW IRON PO) Take 1 tablet by mouth daily.  . SUMAtriptan (IMITREX) 50 MG tablet Take 50 mg by mouth every 2 (two) hours as needed for migraine.  . triamcinolone (KENALOG) 0.1 % Apply 1 application topically 2 (two) times daily as needed (psoriasis). Mixed with Cetaphil  . venlafaxine XR (EFFEXOR-XR) 37.5 MG 24 hr capsule Take 37.5 mg by mouth daily.  Marland Kitchen venlafaxine XR (EFFEXOR-XR) 75 MG 24 hr capsule Take 75 mg by mouth daily. Patient reports she is taking 1.5 doses per day per instruction from Dr. Dyane Dustman     Allergies:   Lotensin [benazepril hcl], Morphine, Erythromycin base, Hydrocodone, and Wound dressing adhesive   Social History   Socioeconomic History  . Marital status: Married    Spouse name: Not on file  . Number of children: 2  . Years of education: Not on file  . Highest education level: Not on file  Occupational History  . Not on file  Tobacco Use  . Smoking status: Never Smoker  . Smokeless tobacco: Never Used  Vaping Use  . Vaping Use: Never used  Substance and Sexual Activity  . Alcohol use: Never  . Drug use: Never  . Sexual activity: Not on file  Other Topics Concern  . Not on file  Social History Narrative  . Not on file   Social Determinants of Health   Financial Resource Strain: Not on file  Food Insecurity: Not on file  Transportation Needs: Not on file  Physical Activity: Not on file  Stress: Not on file  Social Connections: Not on file     Family History: The patient's family history includes Cancer in her maternal uncle; Colon cancer in her cousin and paternal uncle; Colon cancer (age of onset: 50) in her father.  ROS:   Please see the history of present illness.    Review of Systems  Constitutional: Positive for malaise/fatigue. Negative for chills and fever.  HENT: Negative for hearing loss.   Eyes: Negative for blurred vision.  Respiratory:  Positive for shortness of breath.   Cardiovascular: Negative for chest pain, palpitations, orthopnea, claudication, leg swelling and PND.  Gastrointestinal: Negative for blood in stool and melena.  Genitourinary: Negative for flank pain and hematuria.  Musculoskeletal: Positive for back pain, joint pain and myalgias. Negative for falls.  Neurological: Negative for dizziness, sensory change and loss of consciousness.  Psychiatric/Behavioral: Negative for substance abuse.    EKGs/Labs/Other Studies Reviewed:    The following studies were reviewed today:  Myoview 12/27/20:  The left ventricular ejection fraction is hyperdynamic (>65%).  Nuclear stress EF: 74%.  There was no ST segment deviation noted during stress.  The study is normal.  This is a low risk study.   Normal pharmacologic nuclear stress test with no evidence for prior infarct or ischemia. Normal  LVEF.  TTE 12/21/20: IMPRESSIONS  1. Left ventricular ejection fraction, by estimation, is 50 to 55%. The  left ventricle has low normal function. The left ventricle has no regional  wall motion abnormalities. Left ventricular diastolic parameters are  indeterminate.  2. Right ventricular systolic function is normal. The right ventricular  size is not well visualized.  3. Left atrial size was moderately dilated.  4. The mitral valve is normal in structure. At most moderate mitral valve  regurgitation with variability in beat to beat assessment. Pulmonary vein  systolic blunting. 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 No evidence of mitral  stenosis.  5. The aortic valve is normal in structure. Aortic valve regurgitation is  not visualized. No aortic stenosis is present.  6. The inferior vena cava is normal in size with greater than 50%  respiratory variability, suggesting right atrial pressure of 3 mmHg.    EKG:  EKG 12/18/20: Rate controlled Afib with HR 69  Recent Labs: 12/05/2020:  ALT 32 02/13/2021: BUN 13; Creatinine, Ser 0.81; Hemoglobin 10.2; Platelets 268; Potassium 3.9; Sodium 137  Recent Lipid Panel No results found for: CHOL, TRIG, HDL, CHOLHDL, VLDL, LDLCALC, LDLDIRECT   Risk Assessment/Calculations:    CHA2DS2-VASc Score = 4  This indicates a 4.8% annual risk of stroke. The patient's score is based upon: CHF History: No HTN History: Yes Diabetes History: Yes Stroke History: No Vascular Disease History: No Age Score: 1 Gender Score: 1     Physical Exam:    VS:  BP 132/78   Pulse 78   Ht 5\' 2"  (1.575 m)   Wt 208 lb 9.6 oz (94.6 kg)   SpO2 95%   BMI 38.15 kg/m     Wt Readings from Last 3 Encounters:  03/28/21 208 lb 9.6 oz (94.6 kg)  03/26/21 208 lb 2 oz (94.4 kg)  02/18/21 206 lb (93.4 kg)     GEN:  Comfortable, NAD HEENT: Normal NECK: No JVD; No carotid bruits CARDIAC: Irregularly irregular, no murmurs, rubs, gallops RESPIRATORY:  CTAB, no wheezes ABDOMEN: Soft, non-tender, non-distended MUSCULOSKELETAL:  No edema; No deformity  SKIN: Warm and dry NEUROLOGIC:  Alert and oriented x 3 PSYCHIATRIC:  Normal affect   ASSESSMENT:    1. Atrial fibrillation, unspecified type ([place])   2. Pre-procedure lab exam   3. Encounter for laboratory examination   4. Dyspnea on exertion   5. Primary hypertension   6. Type 2 diabetes mellitus without complication, without long-term current use of insulin (HCC)   7. Malignant neoplasm of upper-outer quadrant of right female breast, unspecified estrogen receptor status ([place])   8. Fibromyalgia    PLAN:    In order of problems listed above:  #Newly Diagnosed Atrial Fibrillation: Incidentally detected on pre-operative ECG. Patient is relatively asymptomatic with no palpitations, SOB, lightheadedness or chest pain. Has chronic fatigue at baseline which she attributes to fibromyalgia and occasional episodes of SOB. CHADS-vasc 4. HR currently well controlled in the 60s. Remains in Afib today. -Plan  for DCCV; no missed doses of apixaban -Continue apixaban 5mg  BID -Stopped ASA given need for [facility] -TTE with normal LVEF, moderate MR, moderate LAE -Discussed sleep study today; would like to defer until after XRT   #HTN: Blood pressure well controlled -Continue losartan-HCTZ 100-25mg  daily; refill as needed  #DMII:  Managed by PCP. A1C 6.6. -Continue metformin 500mg  daily  #Newly diagnosed right breast cancer: Followed by Dr. Isidore Moos with Oncology. -S/p right breast lumpectomy and lymph node resection -  Plan to start radiation therapy at the end of May  #HLD: Total 137, LDL 47, HDL 70. -On praulent 150mg  q14days; refill as needed  #Fibromyalgia: #Chronic Pain: -Management per PCP    Medication Adjustments/Labs and Tests Ordered: Current medicines are reviewed at length with the patient today.  Concerns regarding medicines are outlined above.  Orders Placed This Encounter  Procedures  . CBC  . Basic metabolic panel  . EKG 12-Lead   No orders of the defined types were placed in this encounter.   Patient Instructions  Medication Instructions:   Your physician recommends that you continue on your current medications as directed. Please refer to the Current Medication list given to you today.  *If you need a refill on your cardiac medications before your next appointment, please call your pharmacy*   Lab Work:  TODAY-PRE-PROCEDURE LAB--CBC AND BMET  If you have labs (blood work) drawn today and your tests are completely normal, you will receive your results only by: Marland Kitchen MyChart Message (if you have MyChart) OR . A paper copy in the mail If you have any lab test that is abnormal or we need to change your treatment, we will call you to review the results.   Testing/Procedures:  Dear Selena Batten  You are scheduled for a  Cardioversion on Thursday 04/04/21 with Dr. Radford Pax.  Please arrive at the [facility] (Main Entrance A) at [facility]: [place], [place] [address] at 8:30 am. (1 hour prior to procedure unless lab work is needed; if lab work is needed arrive 1.5 hours ahead)  DIET: Nothing to eat or drink after midnight except a sip of water with medications (see medication instructions below)  Medication Instructions: Hold [place] your anticoagulant: ELIQUIS You will need to continue your anticoagulant after your procedure until you are told by your Provider that it is safe to stop   Labs: If patient is on Coumadin, patient needs pt/INR, CBC, BMET within 3 days (No pt/INR needed for patients taking Xarelto, Eliquis, Pradaxa) For patients receiving anesthesia for TEE and all Cardioversion patients: BMET, CBC within 1 week  Come to LAB: TODAY AT OUR OFFICE--CBC AND BMET  COVID TEST Due to recent COVID-19 restrictions implemented by our local and state authorities and in an effort to keep both patients and staff as safe as possible, our hospital system requires COVID-19 testing prior to certain scheduled hospital procedures.  Please go to [address]. [place], [place] [address] on 04/01/21 at 3:10 PM  .  This is a drive up testing site.  You will not need to exit your vehicle.  You will not be billed at the time of testing but may receive a bill later depending on your insurance.  The approximate cost of the test is $100.  You must agree to self-quarantine from the time of your testing until the procedure date on 04/04/21.  This should included staying home with ONLY the people you live with.  Avoid take-out, grocery store shopping or leaving the house for any non-emergent reason.  Failure to have your COVID-19 test done on the date and time you have been scheduled will result in cancellation of your procedure.  Please call our office at [phone] if you have any questions.   You must have a responsible person to drive you home and stay in the waiting area during your procedure.  Failure to do so could result in  cancellation.  Bring your insurance cards.  *Special Note: Every effort is made to have your procedure done on time. Occasionally there are emergencies that occur at the hospital that may cause delays. Please be patient if a delay does occur.     Follow-Up:  3 MONTHS IN THE OFFICE WITH EITHER DR. Johney Frame OR AN EXTENDER       Signed, Freada Bergeron, MD  03/28/2021 10:42 AM    [facility]

## 2021-03-26 ENCOUNTER — Ambulatory Visit
Admission: RE | Admit: 2021-03-26 | Discharge: 2021-03-26 | Disposition: A | Discharge status: Home / Self Care | Payer: Medicare Other | Source: Ambulatory Visit | Attending: Radiation Oncology | Admitting: Radiation Oncology

## 2021-03-26 ENCOUNTER — Other Ambulatory Visit: Payer: Self-pay

## 2021-03-26 VITALS — BP 125/66 | HR 91 | Temp 98.8°F | Resp 17 | Wt 208.1 lb

## 2021-03-26 DIAGNOSIS — C50411 Malignant neoplasm of upper-outer quadrant of right female breast: Secondary | ICD-10-CM

## 2021-03-26 DIAGNOSIS — Z170 Estrogen receptor positive status [ER+]: Secondary | ICD-10-CM | POA: Diagnosis not present

## 2021-03-26 DIAGNOSIS — Z7984 Long term (current) use of oral hypoglycemic drugs: Secondary | ICD-10-CM | POA: Insufficient documentation

## 2021-03-26 DIAGNOSIS — Z7901 Long term (current) use of anticoagulants: Secondary | ICD-10-CM | POA: Diagnosis not present

## 2021-03-26 DIAGNOSIS — Z79899 Other long term (current) drug therapy: Secondary | ICD-10-CM | POA: Diagnosis not present

## 2021-03-26 DIAGNOSIS — Z510 Encounter for antineoplastic radiation therapy: Secondary | ICD-10-CM | POA: Diagnosis not present

## 2021-03-26 NOTE — Progress Notes (Signed)
Radiation Oncology         [phone] ________________________________  Name: Kayla Bennett MRN: [id number]  Date: 03/26/2021  DOB: 05/03/1951  Follow-Up Visit Note  Outpatient  CC: Swaney, Cathy Judge, MD  Feng, Yan, MD  Diagnosis:      ICD-10-CM   1. Malignant neoplasm of upper-outer quadrant of right breast in female, estrogen receptor positive (HCC)  C50.411    Z17.0    Cancer Staging Malignant neoplasm of upper-outer quadrant of right breast in female, estrogen receptor positive (HCC) Staging form: Breast, AJCC 8th Edition - Clinical stage from 11/22/2020: Stage IA (cT1b, cN0, cM0, G2, ER+, PR+, HER2-) - Signed by Feng, Yan, MD on 12/04/2020 Stage prefix: Initial diagnosis - Pathologic: Stage IA (pT1c, pN0, cM0, G2, ER+, PR+, HER2-) - Signed by Causey, Lindsey Cornetto, NP on 02/27/2021 Histologic grading system: 3 grade system   CHIEF COMPLAINT: Here to discuss management of right breast cancer  Narrative:  The patient returns today for follow-up. She was seen in consultation on 12/05/2020.   Since consultation date, she has not undergone any significant imaging studies.  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 nodal status of negative (0/4); ER status: 95% strong; PR status: 95% strong; Her2 status: negative; Grade: 2.  Per Keisha Martini-Breast Navigator: Received oncotype results of 0/3%  Lymphedema issues, if any:  Patient denies. Reports she had a PT evaluation and was told to follow-up in July after she finishes radiation     Pain issues, if any: Reports mild residual tenderness near lumpectomy incision. She also deals with generalized discomfort due to her fibromyalgia   SAFETY ISSUES:  Prior radiation? No  Pacemaker/ICD? No  Possible current pregnancy? No--postmenopausal  Is the patient  on methotrexate? No  Current Complaints / other details:  Reports her lumpectomy incision started weeping last Friday 03/22/21 (a brown/sticky fluid). States she called her surgeon's office and was told it was most like a seroma and nothing to be concerned about. She was instructed to massage the area and monitor for signs/symptoms of infection.         ALLERGIES:  is allergic to lotensin [benazepril hcl], morphine, erythromycin base, hydrocodone, and wound dressing adhesive.  Meds: Current Outpatient Medications  Medication Sig Dispense Refill  . acetaminophen (TYLENOL) 500 MG tablet Take 500 mg by mouth every 8 (eight) hours as needed for moderate pain.    . albuterol (VENTOLIN HFA) 108 (90 Base) MCG/ACT inhaler Inhale 2 puffs into the lungs every 6 (six) hours as needed for wheezing or shortness of breath.    . Alirocumab (PRALUENT) 150 MG/ML SOAJ Inject 150 mg into the skin every 14 (fourteen) days.    . apixaban (ELIQUIS) 5 MG TABS tablet Take 1 tablet (5 mg total) by mouth 2 (two) times daily. 60 tablet 11  . cetirizine (ZYRTEC) 10 MG tablet Take 10 mg by mouth daily.    . cholecalciferol (VITAMIN D3) 25 MCG (1000 UNIT) tablet Take 2,000 Units by mouth daily.    . gabapentin (NEURONTIN) 100 MG capsule Take 200 mg by mouth at bedtime.    . ibuprofen (ADVIL) 200 MG tablet Take 400 mg by mouth every 8 (eight) hours as needed for moderate pain.    . losartan-hydrochlorothiazide (HYZAAR) 100-25 MG tablet Take 1 tablet by mouth daily.    . metFORMIN (  GLUCOPHAGE-XR) 500 MG 24 hr tablet Take 500 mg by mouth at bedtime.    . pantoprazole (PROTONIX) 40 MG tablet Take 40 mg by mouth 2 (two) times daily before a meal.    . Prenatal Vit-Fe Fumarate-FA (PRENATAL VITAMIN PLUS LOW IRON PO) Take 1 tablet by mouth daily.    . SUMAtriptan (IMITREX) 50 MG tablet Take 50 mg by mouth every 2 (two) hours as needed for migraine.    . triamcinolone (KENALOG) 0.1 % Apply 1 application topically 2 (two) times daily  as needed (psoriasis). Mixed with Cetaphil    . venlafaxine XR (EFFEXOR-XR) 37.5 MG 24 hr capsule Take 37.5 mg by mouth daily.    . venlafaxine XR (EFFEXOR-XR) 75 MG 24 hr capsule Take 75 mg by mouth daily. Patient reports she is taking 1.5 doses per day per instruction from Dr. Kathy Judge     No current facility-administered medications for this encounter.    Physical Findings:  weight is 208 lb 2 oz (94.4 kg). Her oral temperature is 98.8 F (37.1 C). Her blood pressure is 125/66 and her pulse is 91. Her respiration is 17 and oxygen saturation is 98%. .     General: Alert and oriented, in no acute distress Psychiatric: Judgment and insight are intact. Affect is appropriate. Breast exam reveals mild redness at the lumpectomy scar in the upper outer quadrant of the right breast.  This does not appear to be infected.  There is no excessive warmth nor any rash.  There is a palpable seroma at the site.  No active oozing. Ext: Good range of motion in shoulders  Lab Findings: Lab Results  Component Value Date   WBC 6.7 03/28/2021   HGB 9.8 (L) 03/28/2021   HCT 31.7 (L) 03/28/2021   MCV 81 03/28/2021   PLT 279 03/28/2021     Radiographic Findings: As above  Impression/Plan: Right breast cancer  We discussed adjuvant radiotherapy today.  I recommend 4 weeks of radiation therapy to the right breast in order to reduce risk of local regional recurrence by two thirds.  I reviewed the logistics, benefits, risks, and potential side effects of this treatment in detail. Risks may include but not necessary be limited to acute and late injury tissue in the radiation fields such as skin irritation (change in color/pigmentation, itching, dryness, pain, peeling). She may experience fatigue. We also discussed possible risk of long term cosmetic changes or scar tissue. There is also a smaller risk for lung toxicity, brachial plexopathy, lymphedema, musculoskeletal changes, rib fragility or induction of a  second malignancy, late chronic non-healing soft tissue wound.    The patient asked good questions which I answered to her satisfaction. She is enthusiastic about proceeding with treatment. A consent form has been signed and placed in her chart.    Proceed with CT simulation today.  On date of service, in total, I spent 25 minutes on this encounter. Patient was seen in person.  _____________________________________   Sarah Squire, MD  This document serves as a record of services personally performed by Sarah Squire, MD. It was created on his behalf by Maleeha Khan, a trained medical scribe. The creation of this record is based on the scribe's personal observations and the provider's statements to them. This document has been checked and approved by the attending provider.  

## 2021-03-28 ENCOUNTER — Encounter: Payer: Self-pay | Admitting: Hematology

## 2021-03-28 ENCOUNTER — Other Ambulatory Visit: Payer: Self-pay

## 2021-03-28 ENCOUNTER — Other Ambulatory Visit: Payer: Self-pay | Admitting: Cardiology

## 2021-03-28 ENCOUNTER — Ambulatory Visit (INDEPENDENT_AMBULATORY_CARE_PROVIDER_SITE_OTHER): Payer: Medicare Other | Admitting: Cardiology

## 2021-03-28 ENCOUNTER — Encounter: Payer: Self-pay | Admitting: Cardiology

## 2021-03-28 VITALS — BP 132/78 | HR 78 | Ht 62.0 in | Wt 208.6 lb

## 2021-03-28 DIAGNOSIS — I4891 Unspecified atrial fibrillation: Secondary | ICD-10-CM

## 2021-03-28 DIAGNOSIS — R0600 Dyspnea, unspecified: Secondary | ICD-10-CM | POA: Diagnosis not present

## 2021-03-28 DIAGNOSIS — M797 Fibromyalgia: Secondary | ICD-10-CM

## 2021-03-28 DIAGNOSIS — Z0189 Encounter for other specified special examinations: Secondary | ICD-10-CM | POA: Diagnosis not present

## 2021-03-28 DIAGNOSIS — Z01812 Encounter for preprocedural laboratory examination: Secondary | ICD-10-CM | POA: Diagnosis not present

## 2021-03-28 DIAGNOSIS — R0609 Other forms of dyspnea: Secondary | ICD-10-CM

## 2021-03-28 DIAGNOSIS — C50411 Malignant neoplasm of upper-outer quadrant of right female breast: Secondary | ICD-10-CM

## 2021-03-28 DIAGNOSIS — I10 Essential (primary) hypertension: Secondary | ICD-10-CM

## 2021-03-28 DIAGNOSIS — E119 Type 2 diabetes mellitus without complications: Secondary | ICD-10-CM

## 2021-03-28 DIAGNOSIS — Z01818 Encounter for other preprocedural examination: Secondary | ICD-10-CM | POA: Diagnosis not present

## 2021-03-28 NOTE — Patient Instructions (Addendum)
Medication Instructions:   Your physician recommends that you continue on your current medications as directed. Please refer to the Current Medication list given to you today.  *If you need a refill on your cardiac medications before your next appointment, please call your pharmacy*   Lab Work:  TODAY-PRE-PROCEDURE LAB--CBC AND BMET  If you have labs (blood work) drawn today and your tests are completely normal, you will receive your results only by: Marland Kitchen MyChart Message (if you have MyChart) OR . A paper copy in the mail If you have any lab test that is abnormal or we need to change your treatment, we will call you to review the results.   Testing/Procedures:  Dear Selena Batten  You are scheduled for a  Cardioversion on Thursday 04/04/21 with Dr. Radford Pax.  Please arrive at the [facility] (Main Entrance A) at [facility]: [place], [place] [address] at 8:30 am. (1 hour prior to procedure unless lab work is needed; if lab work is needed arrive 1.5 hours ahead)  DIET: Nothing to eat or drink after midnight except a sip of water with medications (see medication instructions below)  Medication Instructions: Hold [place] your anticoagulant: ELIQUIS You will need to continue your anticoagulant after your procedure until you are told by your Provider that it is safe to stop   Labs: If patient is on Coumadin, patient needs pt/INR, CBC, BMET within 3 days (No pt/INR needed for patients taking Xarelto, Eliquis, Pradaxa) For patients receiving anesthesia for TEE and all Cardioversion patients: BMET, CBC within 1 week  Come to LAB: TODAY AT OUR OFFICE--CBC AND BMET  COVID TEST Due to recent COVID-19 restrictions implemented by our local and state authorities and in an effort to keep both patients and staff as safe as possible, our hospital system requires COVID-19 testing prior to certain scheduled hospital procedures.   Please go to [address]. [place], [place] [address] on 04/01/21 at 3:10 PM  .  This is a drive up testing site.  You will not need to exit your vehicle.  You will not be billed at the time of testing but may receive a bill later depending on your insurance.  The approximate cost of the test is $100.  You must agree to self-quarantine from the time of your testing until the procedure date on 04/04/21.  This should included staying home with ONLY the people you live with.  Avoid take-out, grocery store shopping or leaving the house for any non-emergent reason.  Failure to have your COVID-19 test done on the date and time you have been scheduled will result in cancellation of your procedure.  Please call our office at [phone] if you have any questions.   You must have a responsible person to drive you home and stay in the waiting area during your procedure. Failure to do so could result in cancellation.  Bring your insurance cards.  *Special Note: Every effort is made to have your procedure done on time. Occasionally there are emergencies that occur at the hospital that may cause delays. Please be patient if a delay does occur.     Follow-Up:  3 MONTHS IN THE OFFICE WITH EITHER DR. Johney Frame OR AN EXTENDER

## 2021-03-29 ENCOUNTER — Encounter: Payer: Self-pay | Admitting: Radiation Oncology

## 2021-03-29 ENCOUNTER — Telehealth: Payer: Self-pay | Admitting: Cardiology

## 2021-03-29 LAB — BASIC METABOLIC PANEL
BUN/Creatinine Ratio: 23 (ref 12–28)
BUN: 17 mg/dL (ref 8–27)
CO2: 26 mmol/L (ref 20–29)
Calcium: 9.1 mg/dL (ref 8.7–10.3)
Chloride: 98 mmol/L (ref 96–106)
Creatinine, Ser: 0.75 mg/dL (ref 0.57–1.00)
Glucose: 132 mg/dL — ABNORMAL HIGH (ref 65–99)
Potassium: 4.1 mmol/L (ref 3.5–5.2)
Sodium: 139 mmol/L (ref 134–144)
eGFR: 86 mL/min/{1.73_m2} (ref >59)

## 2021-03-29 LAB — CBC
Hematocrit: 31.7 % — ABNORMAL LOW (ref 34.0–46.6)
Hemoglobin: 9.8 g/dL — ABNORMAL LOW (ref 11.1–15.9)
MCH: 24.9 pg — ABNORMAL LOW (ref 26.6–33.0)
MCHC: 30.9 g/dL — ABNORMAL LOW (ref 31.5–35.7)
MCV: 81 fL (ref 79–97)
Platelets: 279 10*3/uL (ref 150–450)
RBC: 3.93 x10E6/uL (ref 3.77–5.28)
RDW: 15.9 % — ABNORMAL HIGH (ref 11.7–15.4)
WBC: 6.7 10*3/uL (ref 3.4–10.8)

## 2021-03-29 NOTE — Telephone Encounter (Signed)
Nuala Alpha, LPN  4/96/7591 6:38 PM EDT Back to Top     The patient has been notified of the result and verbalized understanding. All questions (if any) were answered. Nuala Alpha, LPN 4/66/5993 5:70 PM   Pt states she is taking a prenatal vitamin that has iron in it, as her PCP advised her to. She said she will start taking Vitamin C, to help with absorption.  Pt voiced no complaints of constipation.  Pt verbalized understanding and agrees with this plan.         Her labs show she is mildly anemic with a hemoglobin of 9.8. She has been anemic on prior labs as well. I recommend she start iron supplementation daily to help improve her blood counts. She can buy this over the counter and I would recommend she take it with vitamin C to help the absorption. Iron can cause constipation so if that happens, she can also start miralax. Otherwise, her electrolytes and kidney function look great!

## 2021-03-29 NOTE — Telephone Encounter (Signed)
Patient returning call for lab results. 

## 2021-04-01 ENCOUNTER — Other Ambulatory Visit (HOSPITAL_COMMUNITY): Payer: Medicare Other

## 2021-04-01 ENCOUNTER — Encounter: Payer: Self-pay | Admitting: *Deleted

## 2021-04-01 ENCOUNTER — Other Ambulatory Visit (HOSPITAL_COMMUNITY)
Admission: RE | Admit: 2021-04-01 | Discharge: 2021-04-01 | Disposition: A | Discharge status: Home / Self Care | Payer: Medicare Other | Source: Ambulatory Visit | Attending: Cardiology | Admitting: Cardiology

## 2021-04-01 DIAGNOSIS — Z01812 Encounter for preprocedural laboratory examination: Secondary | ICD-10-CM | POA: Insufficient documentation

## 2021-04-01 DIAGNOSIS — Z20822 Contact with and (suspected) exposure to covid-19: Secondary | ICD-10-CM | POA: Insufficient documentation

## 2021-04-01 LAB — SARS CORONAVIRUS 2 (TAT 6-24 HRS): SARS Coronavirus 2: NEGATIVE

## 2021-04-04 ENCOUNTER — Ambulatory Visit (HOSPITAL_COMMUNITY): Payer: Medicare Other | Admitting: Anesthesiology

## 2021-04-04 ENCOUNTER — Ambulatory Visit (HOSPITAL_COMMUNITY)
Admission: RE | Admit: 2021-04-04 | Discharge: 2021-04-04 | Disposition: A | Discharge status: Home / Self Care | Payer: Medicare Other | Attending: Cardiology | Admitting: Cardiology

## 2021-04-04 ENCOUNTER — Encounter (HOSPITAL_COMMUNITY)
Admission: RE | Disposition: A | Discharge status: Home / Self Care | Payer: Self-pay | Source: Home / Self Care | Attending: Cardiology

## 2021-04-04 ENCOUNTER — Encounter (HOSPITAL_COMMUNITY): Payer: Self-pay | Admitting: Cardiology

## 2021-04-04 DIAGNOSIS — I10 Essential (primary) hypertension: Secondary | ICD-10-CM | POA: Insufficient documentation

## 2021-04-04 DIAGNOSIS — K449 Diaphragmatic hernia without obstruction or gangrene: Secondary | ICD-10-CM | POA: Diagnosis not present

## 2021-04-04 DIAGNOSIS — G8929 Other chronic pain: Secondary | ICD-10-CM | POA: Insufficient documentation

## 2021-04-04 DIAGNOSIS — J45909 Unspecified asthma, uncomplicated: Secondary | ICD-10-CM | POA: Diagnosis not present

## 2021-04-04 DIAGNOSIS — C50411 Malignant neoplasm of upper-outer quadrant of right female breast: Secondary | ICD-10-CM | POA: Diagnosis not present

## 2021-04-04 DIAGNOSIS — Z885 Allergy status to narcotic agent status: Secondary | ICD-10-CM | POA: Insufficient documentation

## 2021-04-04 DIAGNOSIS — E119 Type 2 diabetes mellitus without complications: Secondary | ICD-10-CM | POA: Insufficient documentation

## 2021-04-04 DIAGNOSIS — Z881 Allergy status to other antibiotic agents status: Secondary | ICD-10-CM | POA: Insufficient documentation

## 2021-04-04 DIAGNOSIS — Z7901 Long term (current) use of anticoagulants: Secondary | ICD-10-CM | POA: Insufficient documentation

## 2021-04-04 DIAGNOSIS — Z8249 Family history of ischemic heart disease and other diseases of the circulatory system: Secondary | ICD-10-CM | POA: Diagnosis not present

## 2021-04-04 DIAGNOSIS — Z7984 Long term (current) use of oral hypoglycemic drugs: Secondary | ICD-10-CM | POA: Insufficient documentation

## 2021-04-04 DIAGNOSIS — Z79899 Other long term (current) drug therapy: Secondary | ICD-10-CM | POA: Diagnosis not present

## 2021-04-04 DIAGNOSIS — M797 Fibromyalgia: Secondary | ICD-10-CM | POA: Insufficient documentation

## 2021-04-04 DIAGNOSIS — E785 Hyperlipidemia, unspecified: Secondary | ICD-10-CM | POA: Insufficient documentation

## 2021-04-04 DIAGNOSIS — Z888 Allergy status to other drugs, medicaments and biological substances status: Secondary | ICD-10-CM | POA: Diagnosis not present

## 2021-04-04 DIAGNOSIS — I4891 Unspecified atrial fibrillation: Secondary | ICD-10-CM | POA: Insufficient documentation

## 2021-04-04 DIAGNOSIS — I4819 Other persistent atrial fibrillation: Secondary | ICD-10-CM

## 2021-04-04 HISTORY — PX: CARDIOVERSION: SHX1299

## 2021-04-04 SURGERY — CARDIOVERSION
Anesthesia: General

## 2021-04-04 MED ORDER — SODIUM CHLORIDE 0.9 % IV SOLN: INTRAVENOUS | Status: DC | PRN

## 2021-04-04 MED ORDER — PROPOFOL 10 MG/ML IV BOLUS
INTRAVENOUS | Status: DC | PRN
  Administered 2021-04-04: 60 mg via INTRAVENOUS
  Administered 2021-04-04: 20 mg via INTRAVENOUS

## 2021-04-04 MED ORDER — LIDOCAINE 2% (20 MG/ML) 5 ML SYRINGE
INTRAMUSCULAR | Status: DC | PRN
  Administered 2021-04-04: 40 mg via INTRAVENOUS

## 2021-04-04 NOTE — Transfer of Care (Signed)
Immediate Anesthesia Transfer of Care Note  Patient: Kayla Bennett  Procedure(s) Performed: CARDIOVERSION (N/A )  Patient Location: Endoscopy Unit  Anesthesia Type:General  Level of Consciousness: awake, alert  and drowsy  Airway & Oxygen Therapy: Patient Spontanous Breathing and Patient connected to nasal cannula oxygen  Post-op Assessment: Report given to RN, Post -op Vital signs reviewed and stable and Patient moving all extremities  Post vital signs: Reviewed and stable  Last Vitals:  Vitals Value Taken Time  BP 164/84   Temp    Pulse 105   Resp 14   SpO2 100     Last Pain:  Vitals:   04/04/21 0900  TempSrc: Oral  PainSc: 0-No pain         Complications: No complications documented.

## 2021-04-04 NOTE — Interval H&P Note (Signed)
History and Physical Interval Note:  04/04/2021 9:20 AM  Kayla Bennett  has presented today for surgery, with the diagnosis of AFIB.  The various methods of treatment have been discussed with the patient and family. After consideration of risks, benefits and other options for treatment, the patient has consented to  Procedure(s): CARDIOVERSION (N/A) as a surgical intervention.  The patient's history has been reviewed, patient examined, no change in status, stable for surgery.  I have reviewed the patient's chart and labs.  Questions were answered to the patient's satisfaction.     Fransico Him

## 2021-04-04 NOTE — Anesthesia Postprocedure Evaluation (Signed)
Anesthesia Post Note  Patient: Charlissa Petros Witte  Procedure(s) Performed: CARDIOVERSION (N/A )     Patient location during evaluation: Endoscopy Anesthesia Type: General Level of consciousness: awake Pain management: pain level controlled Vital Signs Assessment: post-procedure vital signs reviewed and stable Respiratory status: spontaneous breathing Cardiovascular status: stable Postop Assessment: no apparent nausea or vomiting Anesthetic complications: no   No complications documented.  Last Vitals:  Vitals:   04/04/21 0900  BP: (!) 158/70  Pulse: 87  Resp: 16  Temp: 36.9 C  SpO2: 99%    Last Pain:  Vitals:   04/04/21 0900  TempSrc: Oral  PainSc: 0-No pain                 Lukka Black

## 2021-04-04 NOTE — Discharge Instructions (Signed)
Electrical Cardioversion Electrical cardioversion is the delivery of a jolt of electricity to restore a normal rhythm to the heart. A rhythm that is too fast or is not regular keeps the heart from pumping well. In this procedure, sticky patches or metal paddles are placed on the chest to deliver electricity to the heart from a device. This procedure may be done in an emergency if:  There is low or no blood pressure as a result of the heart rhythm.  Normal rhythm must be restored as fast as possible to protect the brain and heart from further damage.  It may save a life. This may also be a scheduled procedure for irregular or fast heart rhythms that are not immediately life-threatening. Tell a health care provider about:  Any allergies you have.  All medicines you are taking, including vitamins, herbs, eye drops, creams, and over-the-counter medicines.  Any problems you or family members have had with anesthetic medicines.  Any blood disorders you have.  Any surgeries you have had.  Any medical conditions you have.  Whether you are pregnant or may be pregnant. What are the risks? Generally, this is a safe procedure. However, problems may occur, including:  Allergic reactions to medicines.  A blood clot that breaks free and travels to other parts of your body.  The possible return of an abnormal heart rhythm within hours or days after the procedure.  Your heart stopping (cardiac arrest). This is rare. What happens before the procedure? Medicines  Your health care provider may have you start taking: ? Blood-thinning medicines (anticoagulants) so your blood does not clot as easily. ? Medicines to help stabilize your heart rate and rhythm.  Ask your health care provider about: ? Changing or stopping your regular medicines. This is especially important if you are taking diabetes medicines or blood thinners. ? Taking medicines such as aspirin and ibuprofen. These medicines can  thin your blood. Do not take these medicines unless your health care provider tells you to take them. ? Taking over-the-counter medicines, vitamins, herbs, and supplements. General instructions  Follow instructions from your health care provider about eating or drinking restrictions.  Plan to have someone take you home from the hospital or clinic.  If you will be going home right after the procedure, plan to have someone with you for 24 hours.  Ask your health care provider what steps will be taken to help prevent infection. These may include washing your skin with a germ-killing soap. What happens during the procedure?  An IV will be inserted into one of your veins.  Sticky patches (electrodes) or metal paddles may be placed on your chest.  You will be given a medicine to help you relax (sedative).  An electrical shock will be delivered. The procedure may vary among health care providers and hospitals.   What can I expect after the procedure?  Your blood pressure, heart rate, breathing rate, and blood oxygen level will be monitored until you leave the hospital or clinic.  Your heart rhythm will be watched to make sure it does not change.  You may have some redness on the skin where the shocks were given. Follow these instructions at home:  Do not drive for 24 hours if you were given a sedative during your procedure.  Take over-the-counter and prescription medicines only as told by your health care provider.  Ask your health care provider how to check your pulse. Check it often.  Rest for 48 hours after the procedure   or as told by your health care provider.  Avoid or limit your caffeine use as told by your health care provider.  Keep all follow-up visits as told by your health care provider. This is important. Contact a health care provider if:  You feel like your heart is beating too quickly or your pulse is not regular.  You have a serious muscle cramp that does not go  away. Get help right away if:  You have discomfort in your chest.  You are dizzy or you feel faint.  You have trouble breathing or you are short of breath.  Your speech is slurred.  You have trouble moving an arm or leg on one side of your body.  Your fingers or toes turn cold or blue. Summary  Electrical cardioversion is the delivery of a jolt of electricity to restore a normal rhythm to the heart.  This procedure may be done right away in an emergency or may be a scheduled procedure if the condition is not an emergency.  Generally, this is a safe procedure.  After the procedure, check your pulse often as told by your health care provider. This information is not intended to replace advice given to you by your health care provider. Make sure you discuss any questions you have with your health care provider. Document Revised: 06/06/2019 Document Reviewed: 06/06/2019 Elsevier Patient Education  2021 [employer]  

## 2021-04-04 NOTE — Anesthesia Procedure Notes (Signed)
Procedure Name: General with mask airway Performed by: Rande Brunt, CRNA Pre-anesthesia Checklist: Patient identified, Emergency Drugs available, Suction available and Patient being monitored Patient Re-evaluated:Patient Re-evaluated prior to induction Oxygen Delivery Method: Nasal cannula and Ambu bag Preoxygenation: Pre-oxygenation with 100% oxygen Induction Type: IV induction Placement Confirmation: positive ETCO2 and CO2 detector Dental Injury: Teeth and Oropharynx as per pre-operative assessment

## 2021-04-04 NOTE — Anesthesia Preprocedure Evaluation (Signed)
Anesthesia Evaluation  Patient identified by MRN, date of birth, ID band Patient awake    Reviewed: Allergy & Precautions, NPO status , Patient's Chart, lab work & pertinent test results  History of Anesthesia Complications (+) Family history of anesthesia reaction  Airway Mallampati: II  TM Distance: >3 FB     Dental   Pulmonary shortness of breath, asthma ,    breath sounds clear to auscultation       Cardiovascular hypertension, + dysrhythmias Atrial Fibrillation  Rhythm:Irregular Rate:Normal     Neuro/Psych  Headaches,  Neuromuscular disease    GI/Hepatic Neg liver ROS, hiatal hernia, GERD  ,  Endo/Other  diabetes  Renal/GU      Musculoskeletal  (+) Arthritis , Fibromyalgia -  Abdominal   Peds  Hematology  (+) anemia ,   Anesthesia Other Findings   Reproductive/Obstetrics                             Anesthesia Physical Anesthesia Plan  ASA: III  Anesthesia Plan: General   Post-op Pain Management:    Induction: Intravenous  PONV Risk Score and Plan: 3 and Propofol infusion  Airway Management Planned: Nasal Cannula and Simple Face Mask  Additional Equipment:   Intra-op Plan:   Post-operative Plan:   Informed Consent: I have reviewed the patients History and Physical, chart, labs and discussed the procedure including the risks, benefits and alternatives for the proposed anesthesia with the patient or authorized representative who has indicated his/her understanding and acceptance.     Dental advisory given  Plan Discussed with: CRNA and Anesthesiologist  Anesthesia Plan Comments:         Anesthesia Quick Evaluation

## 2021-04-04 NOTE — CV Procedure (Signed)
   Electrical Cardioversion Procedure Note OZELLE BRUBACHER [id number] [birth year]  Procedure: Electrical Cardioversion Indications:  Atrial Fibrillation  Time Out: Verified patient identification, verified procedure,medications/allergies/relevent history reviewed, required imaging and test results available.  Performed  Procedure Details  The patient was NPO after midnight. Anesthesia was administered at the beside  by Dr.Green with 80mg  of propofol.  Cardioversion was done with synchronized biphasic defibrillation with AP pads with 150watts with unsuccessful conversion to NSR.   Cardioversion was done with synchronized biphasic defibrillation with AP pads with 200watts with unsuccessful conversion to NSR. Cardioversion was done with synchronized biphasic defibrillation with AP pads with 200watts. The patient converted to normal sinus rhythm. The patient tolerated the procedure well   IMPRESSION:  Successful cardioversion of atrial fibrillation    Marsden Zaino 04/04/2021, 9:20 AM

## 2021-04-04 NOTE — Interval H&P Note (Signed)
History and Physical Interval Note:  04/04/2021 9:35 AM  Kayla Bennett  has presented today for surgery, with the diagnosis of AFIB.  The various methods of treatment have been discussed with the patient and family. After consideration of risks, benefits and other options for treatment, the patient has consented to  Procedure(s): CARDIOVERSION (N/A) as a surgical intervention.  The patient's history has been reviewed, patient examined, no change in status, stable for surgery.  I have reviewed the patient's chart and labs.  Questions were answered to the patient's satisfaction.     Fransico Him

## 2021-04-05 ENCOUNTER — Encounter (HOSPITAL_COMMUNITY): Payer: Self-pay | Admitting: Cardiology

## 2021-04-08 DIAGNOSIS — C50411 Malignant neoplasm of upper-outer quadrant of right female breast: Secondary | ICD-10-CM | POA: Diagnosis not present

## 2021-04-08 DIAGNOSIS — Z510 Encounter for antineoplastic radiation therapy: Secondary | ICD-10-CM | POA: Diagnosis not present

## 2021-04-08 DIAGNOSIS — Z170 Estrogen receptor positive status [ER+]: Secondary | ICD-10-CM | POA: Diagnosis not present

## 2021-04-09 ENCOUNTER — Other Ambulatory Visit: Payer: Self-pay

## 2021-04-09 ENCOUNTER — Ambulatory Visit
Admission: RE | Admit: 2021-04-09 | Discharge: 2021-04-09 | Disposition: A | Discharge status: Home / Self Care | Payer: Medicare Other | Source: Ambulatory Visit | Attending: Radiation Oncology | Admitting: Radiation Oncology

## 2021-04-09 DIAGNOSIS — Z510 Encounter for antineoplastic radiation therapy: Secondary | ICD-10-CM | POA: Diagnosis not present

## 2021-04-09 DIAGNOSIS — C50411 Malignant neoplasm of upper-outer quadrant of right female breast: Secondary | ICD-10-CM | POA: Diagnosis not present

## 2021-04-09 DIAGNOSIS — Z170 Estrogen receptor positive status [ER+]: Secondary | ICD-10-CM | POA: Diagnosis not present

## 2021-04-10 ENCOUNTER — Ambulatory Visit
Admission: RE | Admit: 2021-04-10 | Discharge: 2021-04-10 | Disposition: A | Discharge status: Home / Self Care | Payer: Medicare Other | Source: Ambulatory Visit | Attending: Radiation Oncology | Admitting: Radiation Oncology

## 2021-04-10 DIAGNOSIS — Z510 Encounter for antineoplastic radiation therapy: Secondary | ICD-10-CM | POA: Diagnosis not present

## 2021-04-10 DIAGNOSIS — Z170 Estrogen receptor positive status [ER+]: Secondary | ICD-10-CM | POA: Diagnosis not present

## 2021-04-10 DIAGNOSIS — C50411 Malignant neoplasm of upper-outer quadrant of right female breast: Secondary | ICD-10-CM | POA: Diagnosis not present

## 2021-04-11 ENCOUNTER — Other Ambulatory Visit: Payer: Self-pay

## 2021-04-11 ENCOUNTER — Ambulatory Visit
Admission: RE | Admit: 2021-04-11 | Discharge: 2021-04-11 | Disposition: A | Discharge status: Home / Self Care | Payer: Medicare Other | Source: Ambulatory Visit | Attending: Radiation Oncology | Admitting: Radiation Oncology

## 2021-04-11 DIAGNOSIS — C50411 Malignant neoplasm of upper-outer quadrant of right female breast: Secondary | ICD-10-CM | POA: Diagnosis not present

## 2021-04-11 DIAGNOSIS — Z170 Estrogen receptor positive status [ER+]: Secondary | ICD-10-CM | POA: Diagnosis not present

## 2021-04-11 DIAGNOSIS — Z510 Encounter for antineoplastic radiation therapy: Secondary | ICD-10-CM | POA: Diagnosis not present

## 2021-04-12 ENCOUNTER — Ambulatory Visit
Admission: RE | Admit: 2021-04-12 | Discharge: 2021-04-12 | Disposition: A | Discharge status: Home / Self Care | Payer: Medicare Other | Source: Ambulatory Visit | Attending: Radiation Oncology | Admitting: Radiation Oncology

## 2021-04-12 DIAGNOSIS — Z170 Estrogen receptor positive status [ER+]: Secondary | ICD-10-CM | POA: Diagnosis not present

## 2021-04-12 DIAGNOSIS — C50411 Malignant neoplasm of upper-outer quadrant of right female breast: Secondary | ICD-10-CM | POA: Diagnosis not present

## 2021-04-12 DIAGNOSIS — Z510 Encounter for antineoplastic radiation therapy: Secondary | ICD-10-CM | POA: Diagnosis not present

## 2021-04-16 ENCOUNTER — Ambulatory Visit
Admission: RE | Admit: 2021-04-16 | Discharge: 2021-04-16 | Disposition: A | Discharge status: Home / Self Care | Payer: Medicare Other | Source: Ambulatory Visit | Attending: Radiation Oncology | Admitting: Radiation Oncology

## 2021-04-16 ENCOUNTER — Other Ambulatory Visit: Payer: Self-pay

## 2021-04-16 DIAGNOSIS — Z510 Encounter for antineoplastic radiation therapy: Secondary | ICD-10-CM | POA: Diagnosis not present

## 2021-04-16 DIAGNOSIS — C50411 Malignant neoplasm of upper-outer quadrant of right female breast: Secondary | ICD-10-CM | POA: Diagnosis not present

## 2021-04-16 DIAGNOSIS — Z170 Estrogen receptor positive status [ER+]: Secondary | ICD-10-CM | POA: Diagnosis not present

## 2021-04-17 ENCOUNTER — Ambulatory Visit
Admission: RE | Admit: 2021-04-17 | Discharge: 2021-04-17 | Disposition: A | Discharge status: Home / Self Care | Payer: Medicare Other | Source: Ambulatory Visit | Attending: Radiation Oncology | Admitting: Radiation Oncology

## 2021-04-17 DIAGNOSIS — C50411 Malignant neoplasm of upper-outer quadrant of right female breast: Secondary | ICD-10-CM | POA: Insufficient documentation

## 2021-04-17 DIAGNOSIS — Z170 Estrogen receptor positive status [ER+]: Secondary | ICD-10-CM | POA: Diagnosis not present

## 2021-04-17 DIAGNOSIS — Z510 Encounter for antineoplastic radiation therapy: Secondary | ICD-10-CM | POA: Diagnosis not present

## 2021-04-17 MED ORDER — RADIAPLEXRX EX GEL: Freq: Once | CUTANEOUS | Status: AC

## 2021-04-17 MED ORDER — ALRA NON-METALLIC DEODORANT (RAD-ONC)
1.0000 "application " | Freq: Once | TOPICAL | Status: AC
  Administered 2021-04-17: 1 via TOPICAL

## 2021-04-17 NOTE — Progress Notes (Signed)

## 2021-04-18 ENCOUNTER — Other Ambulatory Visit: Payer: Self-pay

## 2021-04-18 ENCOUNTER — Ambulatory Visit
Admission: RE | Admit: 2021-04-18 | Discharge: 2021-04-18 | Disposition: A | Discharge status: Home / Self Care | Payer: Medicare Other | Source: Ambulatory Visit | Attending: Radiation Oncology | Admitting: Radiation Oncology

## 2021-04-18 DIAGNOSIS — Z170 Estrogen receptor positive status [ER+]: Secondary | ICD-10-CM | POA: Diagnosis not present

## 2021-04-18 DIAGNOSIS — Z510 Encounter for antineoplastic radiation therapy: Secondary | ICD-10-CM | POA: Diagnosis not present

## 2021-04-18 DIAGNOSIS — C50411 Malignant neoplasm of upper-outer quadrant of right female breast: Secondary | ICD-10-CM | POA: Diagnosis not present

## 2021-04-19 ENCOUNTER — Ambulatory Visit
Admission: RE | Admit: 2021-04-19 | Discharge: 2021-04-19 | Disposition: A | Discharge status: Home / Self Care | Payer: Medicare Other | Source: Ambulatory Visit | Attending: Radiation Oncology | Admitting: Radiation Oncology

## 2021-04-19 DIAGNOSIS — Z170 Estrogen receptor positive status [ER+]: Secondary | ICD-10-CM | POA: Diagnosis not present

## 2021-04-19 DIAGNOSIS — Z510 Encounter for antineoplastic radiation therapy: Secondary | ICD-10-CM | POA: Diagnosis not present

## 2021-04-19 DIAGNOSIS — C50411 Malignant neoplasm of upper-outer quadrant of right female breast: Secondary | ICD-10-CM | POA: Diagnosis not present

## 2021-04-22 ENCOUNTER — Telehealth: Payer: Self-pay | Admitting: Hematology

## 2021-04-22 ENCOUNTER — Other Ambulatory Visit: Payer: Self-pay

## 2021-04-22 ENCOUNTER — Ambulatory Visit
Admission: RE | Admit: 2021-04-22 | Discharge: 2021-04-22 | Disposition: A | Discharge status: Home / Self Care | Payer: Medicare Other | Source: Ambulatory Visit | Attending: Radiation Oncology | Admitting: Radiation Oncology

## 2021-04-22 ENCOUNTER — Encounter: Payer: Self-pay | Admitting: *Deleted

## 2021-04-22 DIAGNOSIS — Z510 Encounter for antineoplastic radiation therapy: Secondary | ICD-10-CM | POA: Diagnosis not present

## 2021-04-22 DIAGNOSIS — C50411 Malignant neoplasm of upper-outer quadrant of right female breast: Secondary | ICD-10-CM

## 2021-04-22 DIAGNOSIS — Z170 Estrogen receptor positive status [ER+]: Secondary | ICD-10-CM | POA: Diagnosis not present

## 2021-04-22 NOTE — Telephone Encounter (Signed)
Scheduled appointment per 06/06 sch msg. Left message.

## 2021-04-23 ENCOUNTER — Ambulatory Visit
Admission: RE | Admit: 2021-04-23 | Discharge: 2021-04-23 | Disposition: A | Discharge status: Home / Self Care | Payer: Medicare Other | Source: Ambulatory Visit | Attending: Radiation Oncology | Admitting: Radiation Oncology

## 2021-04-23 DIAGNOSIS — Z170 Estrogen receptor positive status [ER+]: Secondary | ICD-10-CM | POA: Diagnosis not present

## 2021-04-23 DIAGNOSIS — Z510 Encounter for antineoplastic radiation therapy: Secondary | ICD-10-CM | POA: Diagnosis not present

## 2021-04-23 DIAGNOSIS — C50411 Malignant neoplasm of upper-outer quadrant of right female breast: Secondary | ICD-10-CM | POA: Diagnosis not present

## 2021-04-24 ENCOUNTER — Other Ambulatory Visit: Payer: Self-pay

## 2021-04-24 ENCOUNTER — Ambulatory Visit
Admission: RE | Admit: 2021-04-24 | Discharge: 2021-04-24 | Disposition: A | Discharge status: Home / Self Care | Payer: Medicare Other | Source: Ambulatory Visit | Attending: Radiation Oncology | Admitting: Radiation Oncology

## 2021-04-24 DIAGNOSIS — Z510 Encounter for antineoplastic radiation therapy: Secondary | ICD-10-CM | POA: Diagnosis not present

## 2021-04-24 DIAGNOSIS — C50411 Malignant neoplasm of upper-outer quadrant of right female breast: Secondary | ICD-10-CM | POA: Diagnosis not present

## 2021-04-24 DIAGNOSIS — Z170 Estrogen receptor positive status [ER+]: Secondary | ICD-10-CM | POA: Diagnosis not present

## 2021-04-25 ENCOUNTER — Encounter: Payer: Self-pay | Admitting: Hematology

## 2021-04-25 ENCOUNTER — Ambulatory Visit
Admission: RE | Admit: 2021-04-25 | Discharge: 2021-04-25 | Disposition: A | Discharge status: Home / Self Care | Payer: Medicare Other | Source: Ambulatory Visit | Attending: Radiation Oncology | Admitting: Radiation Oncology

## 2021-04-25 ENCOUNTER — Inpatient Hospital Stay (HOSPITAL_BASED_OUTPATIENT_CLINIC_OR_DEPARTMENT_OTHER): Payer: Medicare Other | Admitting: Hematology

## 2021-04-25 VITALS — BP 139/68 | HR 65 | Temp 97.6°F | Resp 17 | Ht 62.0 in | Wt 208.5 lb

## 2021-04-25 DIAGNOSIS — E1140 Type 2 diabetes mellitus with diabetic neuropathy, unspecified: Secondary | ICD-10-CM | POA: Insufficient documentation

## 2021-04-25 DIAGNOSIS — E785 Hyperlipidemia, unspecified: Secondary | ICD-10-CM | POA: Insufficient documentation

## 2021-04-25 DIAGNOSIS — Z808 Family history of malignant neoplasm of other organs or systems: Secondary | ICD-10-CM | POA: Insufficient documentation

## 2021-04-25 DIAGNOSIS — I4891 Unspecified atrial fibrillation: Secondary | ICD-10-CM | POA: Insufficient documentation

## 2021-04-25 DIAGNOSIS — Z800 Family history of malignant neoplasm of digestive organs: Secondary | ICD-10-CM | POA: Insufficient documentation

## 2021-04-25 DIAGNOSIS — M797 Fibromyalgia: Secondary | ICD-10-CM | POA: Insufficient documentation

## 2021-04-25 DIAGNOSIS — Z7901 Long term (current) use of anticoagulants: Secondary | ICD-10-CM | POA: Insufficient documentation

## 2021-04-25 DIAGNOSIS — Z510 Encounter for antineoplastic radiation therapy: Secondary | ICD-10-CM | POA: Diagnosis not present

## 2021-04-25 DIAGNOSIS — Z7984 Long term (current) use of oral hypoglycemic drugs: Secondary | ICD-10-CM | POA: Insufficient documentation

## 2021-04-25 DIAGNOSIS — Z79899 Other long term (current) drug therapy: Secondary | ICD-10-CM | POA: Insufficient documentation

## 2021-04-25 DIAGNOSIS — D649 Anemia, unspecified: Secondary | ICD-10-CM | POA: Insufficient documentation

## 2021-04-25 DIAGNOSIS — M25562 Pain in left knee: Secondary | ICD-10-CM | POA: Insufficient documentation

## 2021-04-25 DIAGNOSIS — Z170 Estrogen receptor positive status [ER+]: Secondary | ICD-10-CM | POA: Insufficient documentation

## 2021-04-25 DIAGNOSIS — K219 Gastro-esophageal reflux disease without esophagitis: Secondary | ICD-10-CM | POA: Insufficient documentation

## 2021-04-25 DIAGNOSIS — C50411 Malignant neoplasm of upper-outer quadrant of right female breast: Secondary | ICD-10-CM

## 2021-04-25 DIAGNOSIS — M8580 Other specified disorders of bone density and structure, unspecified site: Secondary | ICD-10-CM | POA: Insufficient documentation

## 2021-04-25 DIAGNOSIS — M25512 Pain in left shoulder: Secondary | ICD-10-CM | POA: Insufficient documentation

## 2021-04-25 DIAGNOSIS — M25559 Pain in unspecified hip: Secondary | ICD-10-CM | POA: Insufficient documentation

## 2021-04-25 DIAGNOSIS — I10 Essential (primary) hypertension: Secondary | ICD-10-CM | POA: Insufficient documentation

## 2021-04-25 DIAGNOSIS — M25511 Pain in right shoulder: Secondary | ICD-10-CM | POA: Insufficient documentation

## 2021-04-25 DIAGNOSIS — G8929 Other chronic pain: Secondary | ICD-10-CM | POA: Insufficient documentation

## 2021-04-25 MED ORDER — TAMOXIFEN CITRATE 20 MG PO TABS: 20.0000 mg | ORAL_TABLET | Freq: Every day | ORAL | 3 refills | Status: DC

## 2021-04-25 NOTE — Progress Notes (Signed)
[place]   [phone] Fax:([phone]) [phone]   Clinic Follow up Note   Patient Care Team: [name], MD as PCP - General (Family Medicine) Mauro Kaufmann, RN as Oncology Nurse Navigator Rockwell Germany, RN as Oncology Nurse Navigator [name], MD as Consulting Physician (General Surgery) Truitt Merle, MD as Consulting Physician (Hematology) Eppie Gibson, MD as Attending Physician (Radiation Oncology)  Date of Service:  04/25/2021  CHIEF COMPLAINT: f/u of right breast cancer  SUMMARY OF ONCOLOGIC HISTORY: Oncology History Overview Note  Cancer Staging Malignant neoplasm of upper-outer quadrant of right breast in female, estrogen receptor positive ([place]) Staging form: Breast, AJCC 8th Edition - Clinical stage from 11/22/2020: Stage IA (cT1b, cN0, cM0, G2, ER+, PR+, HER2-) - Signed by Truitt Merle, MD on 12/04/2020 Stage prefix: Initial diagnosis - Pathologic: Stage IA (pT1c, pN0, cM0, G2, ER+, PR+, HER2-) - Signed by Gardenia Phlegm, NP on 02/27/2021 Histologic grading system: 3 grade system    Malignant neoplasm of upper-outer quadrant of right breast in female, estrogen receptor positive ([place])  11/05/2020 Breast US   US Breast 11/05/20  IMPRESSION No Sonographic correlate is seen for the round microlobulated 58m mass in the upper outer right breast 10cm from the nipple, 9: 00 position.  Given this mass is new on mammography and has suspicious borders, a stereotactic guided biopsy is recommended.     11/22/2020 Cancer Staging   Staging form: Breast, AJCC 8th Edition - Clinical stage from 11/22/2020: Stage IA (cT1b, cN0, cM0, G2, ER+, PR+, HER2-) - Signed by FTruitt Merle MD on 12/04/2020    11/22/2020 Initial Biopsy    Diagnosis 11/22/20  Breast, right, needle core biopsy, 10 cmfn, upper outer quadrant, post depth - INVASIVE DUCTAL CARCINOMA. SEE NOTE Diagnosis Note Carcinoma measures 0.7 cm in greatest linear dimension and appears grade 2.  Dr. MTresa Moorereviewed the case and concurs with the diagnosis. A breast prognostic profile (ER, PR, Ki-67 and HER2) is pending and will be reported in an addendum. Dr. HLuan Pullingwas notified on 11/23/2020.   11/22/2020 Receptors her2    ROGNOSTIC INDICATORS Results: IMMUNOHISTOCHEMICAL AND MORPHOMETRIC ANALYSIS PERFORMED MANUALLY The tumor cells are NEGATIVE for Her2 (1+). Estrogen Receptor: 95%, POSITIVE, STRONG STAINING INTENSITY Progesterone Receptor: 95%, POSITIVE, STRONG STAINING INTENSITY Proliferation Marker Ki67: 10%   11/28/2020 Initial Diagnosis   Malignant neoplasm of upper-outer quadrant of right breast in female, estrogen receptor positive ([place]    12/22/2020 Genetic Testing   Negative hereditary cancer genetic testing: no pathogenic variants detected in [facility] Common Hereditary Cancers Panel + Melanoma Genes.  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  The report date is December 22, 2020.    The Common Hereditary Cancers Panel+Melanoma Genes offered by [facility] includes sequencing and/or deletion duplication testing of the following 52 genes: APC, ATM, AXIN2, BAP1, BARD1, BMPR1A, BRCA1, BRCA2, BRIP1, CDH1, CDK4, CDKN2A (p14ARF), CDKN2A (p16INK4a), CHEK2, CTNNA1, DICER1, EPCAM (Deletion/duplication testing only), GREM1 (promoter region deletion/duplication testing only), HOXB13, KIT, MEN1, MITF, MLH1, MSH2, MSH3, MSH6, MUTYH, NBN, NF1, NHTL1, PALB2, PDGFRA, PMS2, POLD1, POLE, PTEN, POT1, RAD50, RAD51C, RAD51D, RB1, RNF43, SDHA, SDHB, SDHC, SDHD, SMAD4, SMARCA4, STK11, TP53, TSC1, TSC2, and VHL.  The following genes were evaluated for sequence changes only: SDHA and HOXB13 c.251G>A variant only.   02/18/2021 Surgery   FINAL MICROSCOPIC DIAGNOSIS:   A. BREAST, RIGHT, LUMPECTOMY:  - Invasive ductal carcinoma, 1.2 cm, grade 2  - Ductal carcinoma in situ, intermediate grade  -  Resection margins are negative for carcinoma; posterior margin is less than 1 mm  from carcinoma  - Biopsy site changes  - See oncology table   B. LYMPH NODE, RIGHT AXILLARY #1, SENTINEL, EXCISION:  - Lymph node, negative for carcinoma (0/1)   C. LYMPH NODE, RIGHT AXILLARY #2, SENTINEL, EXCISION:  - Lymph node, negative for carcinoma (0/1)   D. LYMPH NODE, RIGHT AXILLARY, SENTINEL, EXCISION:  - Lymph node, negative for carcinoma (0/1)   E. LYMPH NODE, RIGHT AXILLARY #3, SENTINEL, EXCISION:  - Lymph node, negative for carcinoma (0/1)   F. LYMPH NODE, RIGHT AXILLARY, SENTINEL, EXCISION:  - Lymph node, negative for carcinoma (0/1)     02/18/2021 Oncotype testing   Oncotype DX was obtained on the final surgical sample and the recurrence score of 0 predicts a risk of recurrence outside the breast over the next 9 years of 3%, if the patient's only systemic therapy is an antiestrogen for 5 years.  It also predicts no benefit from chemotherapy.   02/27/2021 Cancer Staging   Staging form: Breast, AJCC 8th Edition - Pathologic: Stage IA (pT1c, pN0, cM0, G2, ER+, PR+, HER2-) - Signed by Gardenia Phlegm, NP on 02/27/2021  Histologic grading system: 3 grade system       CURRENT THERAPY:  Radiation therapy, to complete 05/07/21  INTERVAL HISTORY:  Kayla Bennett is here for a follow up of breast cancer. She was last seen by me in breast clinic on 12/05/20. She presents to the clinic alone. She notes her surgery was delayed due to her being diagnosed with Afib and then illness. She notes she is tolerating radiation therapy well, with her main complaint being fatigue.  All other systems were reviewed with the patient and are negative.  MEDICAL HISTORY:  Past Medical History:  Diagnosis Date   Anemia    Arthritis    Asthma    Diabetes ([place])    Dyspnea    Dysrhythmia    afibb on occasion   Family history of adverse reaction to anesthesia    mother had reaction to versed...during surgery, stopped breathing   Fibromyalgia    GERD (gastroesophageal reflux  disease)    Headache    History of hiatal hernia    Hypertension    Osteopenia    Psoriasis     SURGICAL HISTORY: Past Surgical History:  Procedure Laterality Date   ABDOMINAL HYSTERECTOMY     APPENDECTOMY     BREAST LUMPECTOMY WITH RADIOACTIVE SEED AND SENTINEL LYMPH NODE BIOPSY Right 02/18/2021   Procedure: RIGHT BREAST LUMPECTOMY WITH RADIOACTIVE SEED AND SENTINEL LYMPH NODE BIOPSY;  Surgeon: [name], MD;  Location: [facility];  Service: General;  Laterality: Right;   broken wrist     CARDIOVERSION N/A 04/04/2021   Procedure: CARDIOVERSION;  Surgeon: Sueanne Margarita, MD;  Location: [facility];  Service: Cardiovascular;  Laterality: N/A;   [place] and [place]     right wrist   HYSTERECTOMY ABDOMINAL WITH SALPINGO-OOPHORECTOMY     LYSIS OF ADHESION     SHOULDER SURGERY Left    TUBAL LIGATION      I have reviewed the social history and family history with the patient and they are unchanged from previous note.  ALLERGIES:  is allergic to lotensin [benazepril hcl], morphine, erythromycin base, hydrocodone, and wound dressing adhesive.  MEDICATIONS:  Current Outpatient Medications  Medication Sig Dispense Refill   tamoxifen (NOLVADEX) 20 MG  tablet Take 1 tablet (20 mg total) by mouth daily. 30 tablet 3   acetaminophen (TYLENOL) 650 MG CR tablet Take 1,300 mg by mouth every 8 (eight) hours as needed for pain.     albuterol (VENTOLIN HFA) 108 (90 Base) MCG/ACT inhaler Inhale 2 puffs into the lungs every 6 (six) hours as needed for wheezing or shortness of breath.     Alirocumab (PRALUENT) 150 MG/ML SOAJ Inject 150 mg into the skin every 21 ( twenty-one) days.     apixaban (ELIQUIS) 5 MG TABS tablet Take 1 tablet (5 mg total) by mouth 2 (two) times daily. 60 tablet 11   cetirizine (ZYRTEC) 10 MG tablet Take 10 mg by mouth in the morning.     cholecalciferol (VITAMIN D3) 25 MCG (1000 UNIT) tablet Take 1,000 Units by mouth in the  morning and at bedtime.     gabapentin (NEURONTIN) 100 MG capsule Take 200 mg by mouth at bedtime.     ibuprofen (ADVIL) 200 MG tablet Take 400 mg by mouth every 4 (four) hours as needed.     losartan-hydrochlorothiazide (HYZAAR) 100-25 MG tablet Take 1 tablet by mouth in the morning.     metFORMIN (GLUCOPHAGE-XR) 500 MG 24 hr tablet Take 500 mg by mouth at bedtime.     pantoprazole (PROTONIX) 40 MG tablet Take 40 mg by mouth 2 (two) times daily before a meal.     Prenatal Vit-Fe Fumarate-FA (PRENATAL VITAMIN PLUS LOW IRON PO) Take 1 tablet by mouth in the morning.     SUMAtriptan (IMITREX) 100 MG tablet Take 100 mg by mouth every 2 (two) hours as needed for migraine. May repeat in 2 hours if headache persists or recurs.     triamcinolone (KENALOG) 0.1 % Apply 1 application topically 2 (two) times daily as needed (psoriasis). Mixed with Cetaphil     venlafaxine XR (EFFEXOR-XR) 37.5 MG 24 hr capsule Take 37.5 mg by mouth at bedtime. Take 1 capsule (37.5 mg) + 1 capsule (75 mg) by mouth at bedtime     venlafaxine XR (EFFEXOR-XR) 75 MG 24 hr capsule Take 75 mg by mouth at bedtime. Take 1 capsule (75 mg) + 1 capsule (37.5 mg) by mouth at bedtime     vitamin C (ASCORBIC ACID) 500 MG tablet Take 500 mg by mouth daily.     No current facility-administered medications for this visit.    PHYSICAL EXAMINATION: ECOG PERFORMANCE STATUS: 0 - Asymptomatic  Vitals:   04/25/21 1025  BP: 139/68  Pulse: 65  Resp: 17  Temp: 97.6 F (36.4 C)  SpO2: 99%   Filed Weights   04/25/21 1025  Weight: 208 lb 8 oz (94.6 kg)    Due to COVID19 we will limit examination to appearance. Patient had no complaints.  GENERAL:alert, no distress and comfortable SKIN: skin color normal, no rashes or significant lesions EYES: normal, Conjunctiva are pink and non-injected, sclera clear  NEURO: alert & oriented x 3 with fluent speech  LABORATORY DATA:  I have reviewed the data as listed CBC Latest Ref Rng & Units  03/28/2021 02/13/2021 12/18/2020  WBC 3.4 - 10.8 x10E3/uL 6.7 6.7 5.8  Hemoglobin 11.1 - 15.9 g/dL 9.8(L) 10.2(L) 10.8(L)  Hematocrit 34.0 - 46.6 % 31.7(L) 34.2(L) 34.7(L)  Platelets 150 - 450 x10E3/uL 279 268 278     CMP Latest Ref Rng & Units 03/28/2021 02/13/2021 01/15/2021  Glucose 65 - 99 mg/dL 132(H) 100(H) 196(H)  BUN 8 - 27 mg/dL _0 Creatinine  0.57 - 1.00 mg/dL 0.75 0.81 0.87  Sodium 134 - 144 mmol/L 139 137 139  Potassium 3.5 - 5.2 mmol/L 4.1 3.9 3.9  Chloride 96 - 106 mmol/L 98 98 103  CO2 20 - 29 mmol/L _0 Calcium 8.7 - 10.3 mg/dL 9.1 9.3 9.0  Total Protein 6.5 - 8.1 g/dL - - -  Total Bilirubin 0.3 - 1.2 mg/dL - - -  Alkaline Phos 38 - 126 U/L - - -  AST 15 - 41 U/L - - -  ALT 0 - 44 U/L - - -      RADIOGRAPHIC STUDIES: I have personally reviewed the radiological images as listed and agreed with the findings in the report. No results found.   ASSESSMENT & PLAN:  Kayla Bennett is a 70 y.o. female with   1. Malignant neoplasm of upper-outer quadrant of right breast, Stage IA, (pT1c, pN0), ER+/PR+/HER2-, Grade II  -Discovered on screening mammogram. Found to have a 9 mm right breast mass. Biopsy 11/22/20 confirmed invasive ductal carcinoma. -Right lumpectomy on 02/18/21 under Dr. Barry Dienes showed grade 2 invasive ductal carcinoma, 1.2 cm, with DCIS. 5 benign lymph nodes. -Oncotype DX score of 0. -She is currently receiving adjuvant radiation therapy under Dr. Isidore Moos, scheduled to complete on 05/07/21. -Given the strong ER and PR expression, and medical comorbilities, I recommend adjuvant endocrine therapy with Tamoxifen for a total of 5-10 years to reduce the risk of cancer recurrence. Potential benefits and side effects were discussed with patient and she is interested. ---The potential side effects, which includes but not limited to, hot flash, skin and vaginal dryness, slightly increased risk of cardiovascular disease and cataract, small risk of thrombosis and  endometrial cancer, were discussed with her in great details. Preventive strategies for thrombosis, such as being physically active, using compression stocks, avoid cigarette smoking, etc., were reviewed with her. She has had hysterectomy, no concern for endometrial cancer from Tamoxifen, etc. She voiced good understanding, and agrees to proceed. Will start after she completes adjuvant breast radiation. -We also discussed the breast cancer surveillance after her surgery. She will continue annual screening mammogram, self exam, and a routine office visit with lab and exam with [place].   2. Comorbidities: Chronic joint pain, DM, HTN, fibromyalgia, GERD, HLD, osteopenia, Neuropathy -her joint pain is in b/l shoulders, left knee and hip with very limited ROM of left shoulder. -On medications (metformin, losartan/HCTZ, injections for HLD, Gabapentin, Protonix. -Continue to F/u with PCP Dr Dyane Dustman -She has had osteoporosis in her 78s and now osteopenic on 10/2017 DEXA. I discussed Tamoxifen can help strengthen her bones.   3. Genetics -She has extensive paternal family history of colon cancer and melanoma. She is eligible for genetic testing.  -She was previously referred but it appears no one reached out to her.   4. Anemia -Her Hg at 10.7 today (12/05/20). Pt notes h/o anemia years ago especially around the time of her hysterectomy.      PLAN:  -Begin tamoxifen in 05/2021 -She will be seen in the survivorship clinic in 3 months -f/u with me in 6 months    No problem-specific Assessment & Plan notes found for this encounter.   No orders of the defined types were placed in this encounter.  All questions were answered. The patient knows to call the clinic with any problems, questions or concerns. No barriers to learning was detected. The total time spent in the appointment was 30 minutes.  Truitt Merle, MD 04/25/2021   I, Wilburn Mylar, am acting as scribe for Truitt Merle, MD.   I have  reviewed the above documentation for accuracy and completeness, and I agree with the above.

## 2021-04-26 ENCOUNTER — Other Ambulatory Visit: Payer: Self-pay

## 2021-04-26 ENCOUNTER — Ambulatory Visit
Admission: RE | Admit: 2021-04-26 | Discharge: 2021-04-26 | Disposition: A | Discharge status: Home / Self Care | Payer: Medicare Other | Source: Ambulatory Visit | Attending: Radiation Oncology | Admitting: Radiation Oncology

## 2021-04-26 DIAGNOSIS — C50411 Malignant neoplasm of upper-outer quadrant of right female breast: Secondary | ICD-10-CM | POA: Diagnosis not present

## 2021-04-26 DIAGNOSIS — Z510 Encounter for antineoplastic radiation therapy: Secondary | ICD-10-CM | POA: Diagnosis not present

## 2021-04-26 DIAGNOSIS — Z170 Estrogen receptor positive status [ER+]: Secondary | ICD-10-CM | POA: Diagnosis not present

## 2021-04-29 ENCOUNTER — Ambulatory Visit: Payer: Medicare Other | Admitting: Radiation Oncology

## 2021-04-29 ENCOUNTER — Ambulatory Visit
Admission: RE | Admit: 2021-04-29 | Discharge: 2021-04-29 | Disposition: A | Discharge status: Home / Self Care | Payer: Medicare Other | Source: Ambulatory Visit | Attending: Radiation Oncology | Admitting: Radiation Oncology

## 2021-04-29 ENCOUNTER — Other Ambulatory Visit: Payer: Self-pay

## 2021-04-29 DIAGNOSIS — Z510 Encounter for antineoplastic radiation therapy: Secondary | ICD-10-CM | POA: Diagnosis not present

## 2021-04-29 DIAGNOSIS — Z170 Estrogen receptor positive status [ER+]: Secondary | ICD-10-CM | POA: Diagnosis not present

## 2021-04-29 DIAGNOSIS — C50411 Malignant neoplasm of upper-outer quadrant of right female breast: Secondary | ICD-10-CM | POA: Diagnosis not present

## 2021-04-30 ENCOUNTER — Other Ambulatory Visit: Payer: Self-pay

## 2021-04-30 ENCOUNTER — Ambulatory Visit
Admission: RE | Admit: 2021-04-30 | Discharge: 2021-04-30 | Disposition: A | Discharge status: Home / Self Care | Payer: Medicare Other | Source: Ambulatory Visit | Attending: Radiation Oncology | Admitting: Radiation Oncology

## 2021-04-30 DIAGNOSIS — C50411 Malignant neoplasm of upper-outer quadrant of right female breast: Secondary | ICD-10-CM | POA: Diagnosis not present

## 2021-04-30 DIAGNOSIS — Z170 Estrogen receptor positive status [ER+]: Secondary | ICD-10-CM | POA: Diagnosis not present

## 2021-04-30 DIAGNOSIS — Z510 Encounter for antineoplastic radiation therapy: Secondary | ICD-10-CM | POA: Diagnosis not present

## 2021-05-01 ENCOUNTER — Ambulatory Visit
Admission: RE | Admit: 2021-05-01 | Discharge: 2021-05-01 | Disposition: A | Discharge status: Home / Self Care | Payer: Medicare Other | Source: Ambulatory Visit | Attending: Radiation Oncology | Admitting: Radiation Oncology

## 2021-05-01 DIAGNOSIS — C50411 Malignant neoplasm of upper-outer quadrant of right female breast: Secondary | ICD-10-CM | POA: Diagnosis not present

## 2021-05-01 DIAGNOSIS — Z170 Estrogen receptor positive status [ER+]: Secondary | ICD-10-CM | POA: Diagnosis not present

## 2021-05-01 DIAGNOSIS — Z510 Encounter for antineoplastic radiation therapy: Secondary | ICD-10-CM | POA: Diagnosis not present

## 2021-05-02 ENCOUNTER — Ambulatory Visit
Admission: RE | Admit: 2021-05-02 | Discharge: 2021-05-02 | Disposition: A | Discharge status: Home / Self Care | Payer: Medicare Other | Source: Ambulatory Visit | Attending: Radiation Oncology | Admitting: Radiation Oncology

## 2021-05-02 ENCOUNTER — Other Ambulatory Visit: Payer: Self-pay

## 2021-05-02 DIAGNOSIS — C50411 Malignant neoplasm of upper-outer quadrant of right female breast: Secondary | ICD-10-CM | POA: Diagnosis not present

## 2021-05-02 DIAGNOSIS — Z170 Estrogen receptor positive status [ER+]: Secondary | ICD-10-CM | POA: Diagnosis not present

## 2021-05-02 DIAGNOSIS — Z510 Encounter for antineoplastic radiation therapy: Secondary | ICD-10-CM | POA: Diagnosis not present

## 2021-05-03 ENCOUNTER — Encounter: Payer: Self-pay | Admitting: *Deleted

## 2021-05-03 ENCOUNTER — Ambulatory Visit
Admission: RE | Admit: 2021-05-03 | Discharge: 2021-05-03 | Disposition: A | Discharge status: Home / Self Care | Payer: Medicare Other | Source: Ambulatory Visit | Attending: Radiation Oncology | Admitting: Radiation Oncology

## 2021-05-03 DIAGNOSIS — Z170 Estrogen receptor positive status [ER+]: Secondary | ICD-10-CM | POA: Diagnosis not present

## 2021-05-03 DIAGNOSIS — Z510 Encounter for antineoplastic radiation therapy: Secondary | ICD-10-CM | POA: Diagnosis not present

## 2021-05-03 DIAGNOSIS — C50411 Malignant neoplasm of upper-outer quadrant of right female breast: Secondary | ICD-10-CM | POA: Diagnosis not present

## 2021-05-06 ENCOUNTER — Ambulatory Visit
Admission: RE | Admit: 2021-05-06 | Discharge: 2021-05-06 | Disposition: A | Discharge status: Home / Self Care | Payer: Medicare Other | Source: Ambulatory Visit | Attending: Radiation Oncology | Admitting: Radiation Oncology

## 2021-05-06 ENCOUNTER — Other Ambulatory Visit: Payer: Self-pay

## 2021-05-06 DIAGNOSIS — Z510 Encounter for antineoplastic radiation therapy: Secondary | ICD-10-CM | POA: Diagnosis not present

## 2021-05-06 DIAGNOSIS — C50411 Malignant neoplasm of upper-outer quadrant of right female breast: Secondary | ICD-10-CM | POA: Diagnosis not present

## 2021-05-06 DIAGNOSIS — Z170 Estrogen receptor positive status [ER+]: Secondary | ICD-10-CM | POA: Diagnosis not present

## 2021-05-07 ENCOUNTER — Encounter: Payer: Self-pay | Admitting: Radiation Oncology

## 2021-05-07 ENCOUNTER — Ambulatory Visit
Admission: RE | Admit: 2021-05-07 | Discharge: 2021-05-07 | Disposition: A | Discharge status: Home / Self Care | Payer: Medicare Other | Source: Ambulatory Visit | Attending: Radiation Oncology | Admitting: Radiation Oncology

## 2021-05-07 DIAGNOSIS — C50411 Malignant neoplasm of upper-outer quadrant of right female breast: Secondary | ICD-10-CM | POA: Diagnosis not present

## 2021-05-07 DIAGNOSIS — Z170 Estrogen receptor positive status [ER+]: Secondary | ICD-10-CM | POA: Diagnosis not present

## 2021-05-07 DIAGNOSIS — B070 Plantar wart: Secondary | ICD-10-CM | POA: Diagnosis not present

## 2021-05-07 DIAGNOSIS — L400 Psoriasis vulgaris: Secondary | ICD-10-CM | POA: Diagnosis not present

## 2021-05-07 DIAGNOSIS — L814 Other melanin hyperpigmentation: Secondary | ICD-10-CM | POA: Diagnosis not present

## 2021-05-07 DIAGNOSIS — D2239 Melanocytic nevi of other parts of face: Secondary | ICD-10-CM | POA: Diagnosis not present

## 2021-05-07 DIAGNOSIS — L570 Actinic keratosis: Secondary | ICD-10-CM | POA: Diagnosis not present

## 2021-05-07 DIAGNOSIS — L821 Other seborrheic keratosis: Secondary | ICD-10-CM | POA: Diagnosis not present

## 2021-05-07 DIAGNOSIS — Z510 Encounter for antineoplastic radiation therapy: Secondary | ICD-10-CM | POA: Diagnosis not present

## 2021-05-09 DIAGNOSIS — I10 Essential (primary) hypertension: Secondary | ICD-10-CM | POA: Diagnosis not present

## 2021-05-09 DIAGNOSIS — E1169 Type 2 diabetes mellitus with other specified complication: Secondary | ICD-10-CM | POA: Diagnosis not present

## 2021-05-09 DIAGNOSIS — E7800 Pure hypercholesterolemia, unspecified: Secondary | ICD-10-CM | POA: Diagnosis not present

## 2021-05-09 DIAGNOSIS — K219 Gastro-esophageal reflux disease without esophagitis: Secondary | ICD-10-CM | POA: Diagnosis not present

## 2021-05-27 ENCOUNTER — Other Ambulatory Visit: Payer: Self-pay

## 2021-05-27 ENCOUNTER — Ambulatory Visit: Payer: Medicare Other | Attending: Hematology | Admitting: Physical Therapy

## 2021-05-27 DIAGNOSIS — Z170 Estrogen receptor positive status [ER+]: Secondary | ICD-10-CM | POA: Insufficient documentation

## 2021-05-27 DIAGNOSIS — Z483 Aftercare following surgery for neoplasm: Secondary | ICD-10-CM | POA: Insufficient documentation

## 2021-05-27 DIAGNOSIS — R293 Abnormal posture: Secondary | ICD-10-CM | POA: Insufficient documentation

## 2021-05-27 DIAGNOSIS — C50411 Malignant neoplasm of upper-outer quadrant of right female breast: Secondary | ICD-10-CM | POA: Insufficient documentation

## 2021-05-27 NOTE — Therapy (Signed)
[place], [place], [address] Phone: [phone]   Fax:  [phone]  Physical Therapy Treatment  Patient Details  Name: Kayla Bennett MRN: [id number] Date of Birth: [birth year] Referring Provider (PT): Dr. [name]   Encounter Date: 05/27/2021   PT End of Session - 05/27/21 1123     Visit Number 2    Number of Visits 2    PT Start Time 1117    PT Stop Time 1125    PT Time Calculation (min) 8 min             Past Medical History:  Diagnosis Date   Anemia    Arthritis    Asthma    Diabetes ([place])    Dyspnea    Dysrhythmia    afibb on occasion   Family history of adverse reaction to anesthesia    mother had reaction to versed...during surgery, stopped breathing   Fibromyalgia    GERD (gastroesophageal reflux disease)    Headache    History of hiatal hernia    Hypertension    Osteopenia    Psoriasis     Past Surgical History:  Procedure Laterality Date   ABDOMINAL HYSTERECTOMY     APPENDECTOMY     BREAST LUMPECTOMY WITH RADIOACTIVE SEED AND SENTINEL LYMPH NODE BIOPSY Right 02/18/2021   Procedure: RIGHT BREAST LUMPECTOMY WITH RADIOACTIVE SEED AND SENTINEL LYMPH NODE BIOPSY;  Surgeon: [name], MD;  Location: [facility];  Service: General;  Laterality: Right;   broken wrist     CARDIOVERSION N/A 04/04/2021   Procedure: CARDIOVERSION;  Surgeon: Sueanne Margarita, MD;  Location: [facility];  Service: Cardiovascular;  Laterality: N/A;   [place] and [place]     right wrist   HYSTERECTOMY ABDOMINAL WITH SALPINGO-OOPHORECTOMY     LYSIS OF ADHESION     SHOULDER SURGERY Left    TUBAL LIGATION      There were no vitals filed for this visit.   Subjective Assessment - 05/27/21 1123     Subjective Patietn here for SOZO screen                    L-DEX FLOWSHEETS - 05/27/21 1100       L-DEX LYMPHEDEMA SCREENING    Measurement Type Unilateral    L-DEX MEASUREMENT EXTREMITY Upper Extremity    POSITION  Standing    DOMINANT SIDE Right    At Risk Side Right    BASELINE SCORE (UNILATERAL) -3.4    L-DEX SCORE (UNILATERAL) -0.2    VALUE CHANGE (UNILAT) 3.2                                    PT Long Term Goals - 03/14/21 1053       PT LONG TERM GOAL #1   Title Patient will demonstrate she has regained full shoulder ROM and function post operatively compared to baselines.    Time 8    Period Weeks    Status Achieved                   Plan - 05/27/21 1123     Clinical Impression Statement In good range with SOZO    PT Next Visit Plan Continue SOZO screens  Patient will benefit from skilled therapeutic intervention in order to improve the following deficits and impairments:     Visit Diagnosis: Malignant neoplasm of upper-outer quadrant of right breast in female, estrogen receptor positive [facility])  Aftercare following surgery for neoplasm  Abnormal posture     Problem List Patient Active Problem List   Diagnosis Date Noted   Persistent atrial fibrillation [facility])    Genetic testing 12/26/2020   Malignant neoplasm of upper-outer quadrant of right breast in female, estrogen receptor positive ([place]) 11/28/2020   Annia Friendly, PT 05/27/21 11:25 AM   [place], [place], [address] Phone: [phone]   Fax:  [phone]  Name: Kayla Bennett MRN: [id number] Date of Birth: 12/09/1950

## 2021-05-30 DIAGNOSIS — Z7984 Long term (current) use of oral hypoglycemic drugs: Secondary | ICD-10-CM | POA: Diagnosis not present

## 2021-05-30 DIAGNOSIS — H25813 Combined forms of age-related cataract, bilateral: Secondary | ICD-10-CM | POA: Diagnosis not present

## 2021-05-30 DIAGNOSIS — E119 Type 2 diabetes mellitus without complications: Secondary | ICD-10-CM | POA: Diagnosis not present

## 2021-05-30 DIAGNOSIS — H524 Presbyopia: Secondary | ICD-10-CM | POA: Diagnosis not present

## 2021-06-12 ENCOUNTER — Ambulatory Visit: Payer: Medicare Other | Admitting: Radiation Oncology

## 2021-06-18 ENCOUNTER — Telehealth: Payer: Self-pay

## 2021-06-18 DIAGNOSIS — Z20822 Contact with and (suspected) exposure to covid-19: Secondary | ICD-10-CM | POA: Diagnosis not present

## 2021-06-18 NOTE — Telephone Encounter (Signed)
I called the patient today about her upcoming follow-up appointment in radiation oncology.   Given the state of the COVID-19 pandemic, concerning case numbers in our community, and guidance from [facility], I offered a phone assessment with the patient to determine if coming to the clinic was necessary. She accepted.  I let the patient know that I had spoken with Dr. Isidore Moos, and she wanted them to know the importance of washing their hands for at least 20 seconds at a time, especially after going out in public, and before they eat.  Limit going out in public whenever possible. Do not touch your face, unless your hands are clean, such as when bathing. Get plenty of rest, eat well, and stay hydrated. Patient verbalized understanding and agreement.   The patient denies any symptomatic concerns other than some congestions/upper respiratory symptoms. I encouraged patient to reach out to her PCP to see if any additional testing/medication is warranted. She verbalized understanding and agreement. She reports occasional nerve pain to the breast, but states it's tolerable. Specifically, she reports good healing of her skin in the radiation fields.  Skin is intact.  I recommended that she continue skin care by applying oil or lotion with vitamin E to the skin in the radiation fields, BID, for 2 more months.    Continue follow-up with medical oncology - follow-up is scheduled on 07/29/2021 with Lacie Burton-NP in the [facility].  I explained that yearly mammograms are important for patients with intact breast tissue, and physical exams are important after mastectomy for patients that cannot undergo mammography.  I encouraged her to call if she had further questions or concerns about her healing. Otherwise, she will follow-up PRN in radiation oncology. Patient is pleased with this plan, and we will cancel her upcoming follow-up to reduce the risk of COVID-19 transmission.

## 2021-06-26 ENCOUNTER — Other Ambulatory Visit: Payer: Self-pay | Admitting: Adult Health

## 2021-06-26 DIAGNOSIS — Z170 Estrogen receptor positive status [ER+]: Secondary | ICD-10-CM

## 2021-06-26 DIAGNOSIS — C50411 Malignant neoplasm of upper-outer quadrant of right female breast: Secondary | ICD-10-CM

## 2021-07-01 NOTE — Progress Notes (Signed)
                                                                                                                                                             Patient Name: Kayla Bennett MRN: [id number] DOB: [birth year] Referring Physician: Quincy Sheehan (Profile Not Attached) Date of Service: 05/07/2021 [facility], [place]                                                        End Of Treatment Note  Diagnoses: C50.411-Malignant neoplasm of upper-outer quadrant of right female breast  Cancer Staging: Cancer Staging Malignant neoplasm of upper-outer quadrant of right breast in female, estrogen receptor positive ([place]) Staging form: Breast, AJCC 8th Edition - Clinical stage from 11/22/2020: Stage IA (cT1b, cN0, cM0, G2, ER+, PR+, HER2-) - Signed by Truitt Merle, MD on 12/04/2020 Stage prefix: Initial diagnosis - Pathologic: Stage IA (pT1c, pN0, cM0, G2, ER+, PR+, HER2-) - Signed by Gardenia Phlegm, NP on 02/27/2021 Histologic grading system: 3 grade system  Intent: Curative  Radiation Treatment Dates: 04/09/2021 through 05/07/2021 Site Technique Total Dose (Gy) Dose per Fx (Gy) Completed Fx Beam Energies  Breast, Right: Breast_Rt 3D 40.05/40.05 2.67 15/15 10X  Breast, Right: Breast_Rt_Bst 3D 10/10 2 5/5 6X   Narrative: The patient tolerated radiation therapy relatively well.   Plan: The patient will follow-up with radiation oncology in one month or prn. -----------------------------------  Eppie Gibson, MD

## 2021-07-04 DIAGNOSIS — L578 Other skin changes due to chronic exposure to nonionizing radiation: Secondary | ICD-10-CM | POA: Diagnosis not present

## 2021-07-04 DIAGNOSIS — B078 Other viral warts: Secondary | ICD-10-CM | POA: Diagnosis not present

## 2021-07-09 NOTE — Progress Notes (Addendum)
Cardiology Office Note:    Date:  07/10/2021   ID:  Kayla Bennett, DOB 05/07/1951, MRN [id number]  PCP:  [name], MD   [facility] Cardiologist:  Freada Bergeron, MD Cardiology APP:  Sharmon [facility]     Referring MD: [name], MD   Chief Complaint:  Follow-up (AFib)    Patient Profile:    Kayla Bennett is a 70 y.o. female with:  Persistent atrial fibrillation  S/p DCCV 03/2021 FHx of CAD, AFib Myoview 2/22: low risk Echocardiogram 2/22: mod MR, normal EF Diabetes mellitus  Hypertension  R Breast CA S/p lumpectomy, LN resection; XRT Asthma  Psoriasis GERD Fibromyalgia   Prior CV studies: GATED SPECT MYO PERF W/LEXISCAN STRESS 1D 12/27/2020 Narrative  The left ventricular ejection fraction is hyperdynamic (>65%).  Nuclear stress EF: 74%.  There was no ST segment deviation noted during stress.  The study is normal.  This is a low risk study. Normal pharmacologic nuclear stress test with no evidence for prior infarct or ischemia. Normal LVEF.   Echocardiogram 12/21/2020 EF 50-55, no RWMA, normal RVSF, moderate LAE, moderate MR  History of Present Illness: Kayla Bennett was last seen by Dr. Johney Frame in 5/22.  She had undergone lumpectomy and lymph node resection and it started radiation therapy for breast cancer.  She remained in atrial fibrillation and was set up for cardioversion.  This was performed in 5/22 and was successful.  She returns for follow-up.  She is here alone.  She notes that she felt well after cardioversion for about 1 week.  Since then, she notes significant fatigue, leg heaviness and some shortness of breath.  She has not had chest pain, syncope, leg edema.          Past Medical History:  Diagnosis Date   Anemia    Arthritis    Asthma    Diabetes ([place])    Dyspnea    Dysrhythmia    afibb on occasion   Family history of adverse reaction to anesthesia    mother had reaction to versed...during  surgery, stopped breathing   Fibromyalgia    GERD (gastroesophageal reflux disease)    Headache    History of hiatal hernia    Hypertension    Osteopenia    Psoriasis     Current Medications: Current Meds  Medication Sig   acetaminophen (TYLENOL) 650 MG CR tablet Take 1,300 mg by mouth every 8 (eight) hours as needed for pain.   albuterol (VENTOLIN HFA) 108 (90 Base) MCG/ACT inhaler Inhale 2 puffs into the lungs every 6 (six) hours as needed for wheezing or shortness of breath.   Alirocumab (PRALUENT) 150 MG/ML SOAJ Inject 150 mg into the skin every 21 ( twenty-one) days.   cetirizine (ZYRTEC) 10 MG tablet Take 10 mg by mouth in the morning.   cholecalciferol (VITAMIN D3) 25 MCG (1000 UNIT) tablet Take 1,000 Units by mouth in the morning and at bedtime.   flecainide (TAMBOCOR) 50 MG tablet Take 1 tablet (50 mg total) by mouth 2 (two) times daily.   gabapentin (NEURONTIN) 100 MG capsule Take 200 mg by mouth at bedtime.   ibuprofen (ADVIL) 200 MG tablet Take 400 mg by mouth every 4 (four) hours as needed.   losartan-hydrochlorothiazide (HYZAAR) 100-25 MG tablet Take 1 tablet by mouth in the morning.   metFORMIN (GLUCOPHAGE-XR) 500 MG 24 hr tablet Take 500 mg by mouth at bedtime.   metoprolol succinate (TOPROL XL) 25  MG 24 hr tablet Take 0.5 tablets (12.5 mg total) by mouth daily.   pantoprazole (PROTONIX) 40 MG tablet Take 40 mg by mouth 2 (two) times daily before a meal.   Prenatal Vit-Fe Fumarate-FA (PRENATAL VITAMIN PLUS LOW IRON PO) Take 1 tablet by mouth in the morning.   SUMAtriptan (IMITREX) 100 MG tablet Take 100 mg by mouth every 2 (two) hours as needed for migraine. May repeat in 2 hours if headache persists or recurs.   tamoxifen (NOLVADEX) 20 MG tablet Take 1 tablet (20 mg total) by mouth daily.   triamcinolone (KENALOG) 0.1 % Apply 1 application topically 2 (two) times daily as needed (psoriasis). Mixed with Cetaphil   venlafaxine XR (EFFEXOR-XR) 37.5 MG 24 hr capsule Take  37.5 mg by mouth at bedtime. Take 1 capsule (37.5 mg) + 1 capsule (75 mg) by mouth at bedtime   venlafaxine XR (EFFEXOR-XR) 75 MG 24 hr capsule Take 75 mg by mouth at bedtime. Take 1 capsule (75 mg) + 1 capsule (37.5 mg) by mouth at bedtime   vitamin C (ASCORBIC ACID) 500 MG tablet Take 500 mg by mouth daily.   [DISCONTINUED] apixaban (ELIQUIS) 5 MG TABS tablet Take 1 tablet (5 mg total) by mouth 2 (two) times daily.     Allergies:   Lotensin [benazepril hcl], Morphine, Erythromycin base, Hydrocodone, and Wound dressing adhesive   Social History   Tobacco Use   Smoking status: Never   Smokeless tobacco: Never  Vaping Use   Vaping Use: Never used  Substance Use Topics   Alcohol use: Never   Drug use: Never     Family Hx: The patient's family history includes Cancer in her maternal uncle; Colon cancer in her cousin and paternal uncle; Colon cancer (age of onset: 66) in her father.  Review of Systems  Gastrointestinal:  Negative for hematochezia and melena.    EKGs/Labs/Other Test Reviewed:    EKG:  EKG is   ordered today.  The ekg ordered today demonstrates atrial fibrillation, HR 85, normal axis, QTc 435, non-specific ST-TW changes   Recent Labs: 12/05/2020: ALT 32 07/10/2021: BUN 17; Creatinine, Ser 0.81; Hemoglobin WILL FOLLOW; Platelets WILL FOLLOW; Potassium 4.1; Sodium 141   Recent Lipid Panel No results found for: CHOL, TRIG, HDL, LDLCALC, LDLDIRECT    Risk Assessment/Calculations:    CHA2DS2-VASc Score = 4  This indicates a 4.8% annual risk of stroke. The patient's score is based upon: CHF History: No HTN History: Yes Diabetes History: Yes Stroke History: No Vascular Disease History: No Age Score: 1 Gender Score: 1   STOP-Bang Score:  5       Physical Exam:    VS:  BP 104/60   Pulse 85   Ht '5\' 2"'$  (1.575 m)   Wt 207 lb 12.8 oz (94.3 kg)   SpO2 97%   BMI 38.01 kg/m     Wt Readings from Last 3 Encounters:  07/10/21 207 lb 12.8 oz (94.3 kg)  04/25/21  208 lb 8 oz (94.6 kg)  04/04/21 208 lb 9.6 oz (94.6 kg)     Constitutional:      Appearance: Healthy appearance. Not in distress.  Neck:     Vascular: JVD normal.  Pulmonary:     Effort: Pulmonary effort is normal.     Breath sounds: No wheezing. No rales.  Cardiovascular:     Normal rate. Irregularly irregular rhythm. Normal S1. Normal S2.      Murmurs: There is no murmur.  Edema:  Peripheral edema absent.  Abdominal:     Palpations: Abdomen is soft.  Skin:    General: Skin is warm and dry.  Neurological:     Mental Status: Alert and oriented to person, place and time.     Cranial Nerves: Cranial nerves are intact.         ASSESSMENT & PLAN:    1. Persistent atrial fibrillation ([place]) She notes that she felt much better for a week after her cardioversion.  She then developed recurrent symptoms of weakness, leg heaviness and some shortness of breath.  She is in atrial fibrillation today on electrocardiogram.  She seems to be symptomatic with atrial fibrillation.  Therefore, we will pursue rhythm control.  I reviewed her case today with Dr. Irish Lack (attending MD).  We will start her on flecainide 50 mg twice daily.  I will also place her on metoprolol succinate 12.5 mg daily.  Plan follow-up in the atrial fibrillation clinic in 2 weeks.  If she remains in atrial fibrillation, plan repeat cardioversion.  If she has success with flecainide, we will eventually need to arrange an exercise treadmill test.  Of note, she has significant hip problems and will not likely be able to walk very far.  Continue Apixaban 5 mg twice daily.  Obtain BMET, CBC today.  2. Essential hypertension Blood pressure is controlled.  Continue losartan/HCTZ 100/25 mg daily.  3. Snoring She notes a history of snoring.  She has an enlarged left atrium on echocardiogram.  She is now in atrial fibrillation.  Therefore, home sleep study will be arranged to rule out sleep apnea.  We discussed the importance of  treating sleep apnea the context of managing atrial fibrillation.         Dispo:  Return in about 6 weeks (around 08/21/2021) for Routine follow up in 6 weeks with Richardson Dopp, PA-C. .   Medication Adjustments/Labs and Tests Ordered: Current medicines are reviewed at length with the patient today.  Concerns regarding medicines are outlined above.  Tests Ordered: Orders Placed This Encounter  Procedures   Basic Metabolic Panel (BMET)   CBC   Amb Referral to AFIB Clinic   EKG 12-Lead   [place] Sleep Study   Medication Changes: Meds ordered this encounter  Medications   flecainide (TAMBOCOR) 50 MG tablet    Sig: Take 1 tablet (50 mg total) by mouth 2 (two) times daily.    Dispense:  60 tablet    Refill:  3   apixaban (ELIQUIS) 5 MG TABS tablet    Sig: Take 1 tablet (5 mg total) by mouth 2 (two) times daily. Pt needs 90 day supply.  Please keep on file till next refill.    Dispense:  180 tablet    Refill:  3   metoprolol succinate (TOPROL XL) 25 MG 24 hr tablet    Sig: Take 0.5 tablets (12.5 mg total) by mouth daily.    Dispense:  30 tablet    Refill:  2    Signed, Richardson Dopp, PA-C  07/10/2021 5:31 PM    [facility] [address], [place], [place]  [address] Phone: [phone]; Fax: [phone]

## 2021-07-10 ENCOUNTER — Other Ambulatory Visit: Payer: Self-pay

## 2021-07-10 ENCOUNTER — Encounter: Payer: Self-pay | Admitting: Physician Assistant

## 2021-07-10 ENCOUNTER — Ambulatory Visit (INDEPENDENT_AMBULATORY_CARE_PROVIDER_SITE_OTHER): Payer: Medicare Other | Admitting: Physician Assistant

## 2021-07-10 VITALS — BP 104/60 | HR 85 | Ht 62.0 in | Wt 207.8 lb

## 2021-07-10 DIAGNOSIS — R0683 Snoring: Secondary | ICD-10-CM | POA: Diagnosis not present

## 2021-07-10 DIAGNOSIS — I10 Essential (primary) hypertension: Secondary | ICD-10-CM | POA: Diagnosis not present

## 2021-07-10 DIAGNOSIS — I4819 Other persistent atrial fibrillation: Secondary | ICD-10-CM | POA: Diagnosis not present

## 2021-07-10 DIAGNOSIS — E119 Type 2 diabetes mellitus without complications: Secondary | ICD-10-CM

## 2021-07-10 LAB — CBC
Hematocrit: 32.6 % — ABNORMAL LOW (ref 34.0–46.6)
Hemoglobin: 10.1 g/dL — ABNORMAL LOW (ref 11.1–15.9)
MCH: 24.6 pg — ABNORMAL LOW (ref 26.6–33.0)
MCHC: 31 g/dL — ABNORMAL LOW (ref 31.5–35.7)
MCV: 80 fL (ref 79–97)
Platelets: 233 10*3/uL (ref 150–450)
RBC: 4.1 x10E6/uL (ref 3.77–5.28)
RDW: 17.2 % — ABNORMAL HIGH (ref 11.7–15.4)
WBC: 5.3 10*3/uL (ref 3.4–10.8)

## 2021-07-10 LAB — BASIC METABOLIC PANEL
BUN/Creatinine Ratio: 21 (ref 12–28)
BUN: 17 mg/dL (ref 8–27)
CO2: 29 mmol/L (ref 20–29)
Calcium: 8.9 mg/dL (ref 8.7–10.3)
Chloride: 98 mmol/L (ref 96–106)
Creatinine, Ser: 0.81 mg/dL (ref 0.57–1.00)
Glucose: 140 mg/dL — ABNORMAL HIGH (ref 65–99)
Potassium: 4.1 mmol/L (ref 3.5–5.2)
Sodium: 141 mmol/L (ref 134–144)
eGFR: 78 mL/min/{1.73_m2} (ref >59)

## 2021-07-10 MED ORDER — METOPROLOL SUCCINATE ER 25 MG PO TB24: 12.5000 mg | ORAL_TABLET | Freq: Every day | ORAL | 2 refills | Status: DC

## 2021-07-10 MED ORDER — APIXABAN 5 MG PO TABS: 5.0000 mg | ORAL_TABLET | Freq: Two times a day (BID) | ORAL | 3 refills | Status: DC

## 2021-07-10 MED ORDER — FLECAINIDE ACETATE 50 MG PO TABS: 50.0000 mg | ORAL_TABLET | Freq: Two times a day (BID) | ORAL | 3 refills | Status: DC

## 2021-07-10 NOTE — Progress Notes (Signed)
Sleep Apnea Evaluation  [facility]  Today's Date: 07/10/2021   Patient Name: Kayla Bennett        DOB: 02-Nov-1951       Height:  '5\' 2"'$  (1.575 m)     Weight: 207 lb 12.8 oz (94.3 kg)  BMI: Body mass index is 38.01 kg/m.    Referring Provider:  Richardson Dopp, PA-C   STOP-BANG RISK ASSESSMENT    STOP-BANG 07/10/2021  Do you snore loudly? Yes  Do you often feel tired, fatigued, or sleepy during the daytime? Yes  Has anyone observed you stop breathing during sleep? No  Do you have (or are you being treated for) high blood pressure? Yes  Recent BMI (Calculated) 38  Is BMI greater than 35 kg/m2? 1=Yes  Age older than 70 years old? 1=Yes  Has large neck size > 40 cm (15.7 in, large female shirt size, large female collar size > 16) No  Gender - Female 0=No  STOP-Bang Total Score 5      If STOP-BANG Score ?3 OR two clinical symptoms - patient qualifies for WatchPAT (CPT 95800)      Sleep study ordered due to two (2) of the following clinical symptoms/diagnoses:  Excessive daytime sleepiness G47.10  Gastroesophageal reflux K21.9  Nocturia R35.1  Morning Headaches G44.221  Difficulty concentrating R41.840  Memory problems or poor judgment G31.84  Personality changes or irritability R45.4  Loud snoring R06.83  Depression F32.9  Unrefreshed by sleep G47.8  Impotence N52.9  History of high blood pressure R03.0  Insomnia G47.00  Sleep Disordered Breathing or Sleep Apnea ICD G47.33

## 2021-07-10 NOTE — Patient Instructions (Addendum)
Medication Instructions:   Your physician has recommended you make the following change in your medication:   START Flecainide one tablet by mouth ( 50 mg) twice daily.  START Toprol one half tablet by mouth ( 12.5 mg ) daily.   *If you need a refill on your cardiac medications before your next appointment, please call your pharmacy*  Lab Work:  TODAY!!!!! BMET/CBC  If you have labs (blood work) drawn today and your tests are completely normal, you will receive your results only by: [place] (if you have MyChart) OR A paper copy in the mail If you have any lab test that is abnormal or we need to change your treatment, we will call you to review the results.   Testing/Procedures:  AFIB CLINIC INFORMATION: Your appointment is scheduled on: Friday, September 9 at 9:00 am . Please arrive 15 minutes early for check-in. The AFib Clinic is located in the Heart and [facility]ty Clinics at [facility]. Parking instructions/directions: [address] (off [address]). When you pull in to Entrance C, there is an underground parking garage to your right. The code to enter the garage is 4455. Take the elevators to the first floor. Follow the signs to the Heart and Vascular Specialty Clinics. You will see registration at the end of the hallway.  Phone number: [phone]  Your physician has recommended that you have a sleep study. DO NOT OPEN BOX TILL SOMEONE CALLS YOU AND THIS HAS BEEN APPROVED BY INSURANCE. This test records several body functions during sleep, including: brain activity, eye movement, oxygen and carbon dioxide blood levels, heart rate and rhythm, breathing rate and rhythm, the flow of air through your mouth and nose, snoring, body muscle movements, and chest and belly movement.   Follow-Up: At [facility], you and your health needs are our priority.  As part of our continuing mission to provide you with exceptional heart care, we have created designated  Provider Care Teams.  These Care Teams include your primary Cardiologist (physician) and Advanced Practice Providers (APPs -  Physician Assistants and Nurse Practitioners) who all work together to provide you with the care you need, when you need it.  We recommend signing up for the patient portal called "MyChart".  Sign up information is provided on this After Visit Summary.  MyChart is used to connect with patients for Virtual Visits (Telemedicine).  Patients are able to view lab/test results, encounter notes, upcoming appointments, etc.  Non-urgent messages can be sent to your provider as well.   To learn more about what you can do with MyChart, go to [email or web address].    Your next appointment:   6 week(s) with Richardson Dopp, PA-C on Friday, October 7 @ 8:45 am.   The format for your next appointment:   In Person  Provider:   Richardson Dopp, PA-C   Other Instructions Flecainide Tablets What is this medication? FLECAINIDE (FLEK a nide) prevents and treats a fast or irregular heartbeat (arrhythmia). It is often used to treat a type of arrhythmia known as AFib (atrial fibrillation). It works by slowing down overactive electric signals in the heart, which stabilizes your heart rhythm. It belongs to a group ofmedications called antiarrhythmics. This medicine may be used for other purposes; ask your health care provider orpharmacist if you have questions. COMMON BRAND NAME(S): Tambocor What should I tell my care team before I take this medication? They need to know if you have any of these conditions: Abnormal levels of potassium in  the blood Heart disease including heart rhythm and heart rate problems Kidney or liver disease Recent heart attack An unusual or allergic reaction to flecainide, local anesthetics, other medications, foods, dyes, or preservatives Pregnant or trying to get pregnant Breast-feeding How should I use this medication? Take this medication by mouth with a glass  of water. Follow the directions on the prescription label. You can take this medication with or without food. Take your doses at regular intervals. Do not take your medication more often than directed. Do not stop taking this medication suddenly. This may cause serious, heart-related side effects. If your care team wants you to stop the medication,the dose may be slowly lowered over time to avoid any side effects. Talk to your care team regarding the use of this medication in children. While this medication may be prescribed for children as young as 1 year of age forselected conditions, precautions do apply. Overdosage: If you think you have taken too much of this medicine contact apoison control center or emergency room at once. NOTE: This medicine is only for you. Do not share this medicine with others. What if I miss a dose? If you miss a dose, take it as soon as you can. If it is almost time for yournext dose, take only that dose. Do not take double or extra doses. What may interact with this medication? Do not take this medication with any of the following: Amoxapine Arsenic trioxide Certain antibiotics like clarithromycin, erythromycin, gatifloxacin, gemifloxacin, levofloxacin, moxifloxacin, sparfloxacin, or troleandomycin Certain antidepressants called tricyclic antidepressants like amitriptyline, imipramine, or nortriptyline Certain medications to control heart rhythm like disopyramide, encainide, moricizine, procainamide, propafenone, and quinidine Cisapride Delavirdine Droperidol Haloperidol [place] Imatinib Levomethadyl Maprotiline Medications for malaria like chloroquine and halofantrine Pentamidine Phenothiazines like chlorpromazine, mesoridazine, prochlorperazine, thioridazine Pimozide Quinine Ranolazine Ritonavir Sertindole This medication may also interact with the following: Cimetidine Dofetilide Medications for angina or high blood pressure Medications to control  heart rhythm like amiodarone and digoxin Ziprasidone This list may not describe all possible interactions. Give your health care provider a list of all the medicines, herbs, non-prescription drugs, or dietary supplements you use. Also tell them if you smoke, drink alcohol, or use illegaldrugs. Some items may interact with your medicine. What should I watch for while using this medication? Visit your care team for regular checks on your progress. Because your condition and the use of this medication carries some risk, it is a good idea to carry an identification card, necklace or bracelet with details of yourcondition, medications, and care team. Check your blood pressure and pulse rate regularly. Ask your care team what your blood pressure and pulse rate should be, and when you should contact them. Your care team also may schedule regular blood tests and electrocardiograms tocheck your progress. You may get drowsy or dizzy. Do not drive, use machinery, or do anything that needs mental alertness until you know how this medication affects you. Do not stand or sit up quickly, especially if you are an older patient. This reduces the risk of dizzy or fainting spells. Alcohol can make you more dizzy, increaseflushing and rapid heartbeats. Avoid alcoholic drinks. What side effects may I notice from receiving this medication? Side effects that you should report to your care team as soon as possible: Allergic reactions-skin rash, itching, hives, swelling of the face, lips, tongue, or throat Heart failure-shortness of breath, swelling of the ankles, feet, or hands, sudden weight gain, unusual weakness or fatigue Heart rhythm changes-fast or irregular heartbeat,  dizziness, feeling faint or lightheaded, chest pain, trouble breathing Liver injury-right upper belly pain, loss of appetite, nausea, light-colored stool, dark yellow or brown urine, yellowing skin or eyes, unusual weakness or fatigue Side effects that  usually do not require medical attention (report to your careteam if they continue or are bothersome): Blurry vision Constipation Dizziness Fatigue Headache Nausea Tremors or shaking This list may not describe all possible side effects. Call your doctor for medical advice about side effects. You may report side effects to FDA at1-800-FDA-1088. Where should I keep my medication? Keep out of the reach of children and pets. Store at room temperature between 15 and 30 degrees C (59 and 86 degrees F). Protect from light. Keep container tightly closed. Throw away any unusedmedication after the expiration date. NOTE: This sheet is a summary. It may not cover all possible information. If you have questions about this medicine, talk to your doctor, pharmacist, orhealth care provider.  2022 Elsevier/Gold Standard (2020-12-06 12:17:39)

## 2021-07-11 ENCOUNTER — Telehealth: Payer: Self-pay | Admitting: *Deleted

## 2021-07-11 NOTE — Telephone Encounter (Signed)
Left message for the pt to call back and ask to s/w Arbie Cookey who does the home sleep studies. Once I s/w the pt I will give her the PIN # to activate her sleep study.

## 2021-07-11 NOTE — Telephone Encounter (Signed)
-----   Message from Lauralee Evener, [place] sent at 07/11/2021  1:54 PM EDT ----- Ok to activate PIN. Insurance does not require a PA. ----- Message ----- From: Tamsen Snider Sent: 07/10/2021  10:58 AM EDT To: [facility] Sleep Studies  [place]   Snoring  Thanks danielle

## 2021-07-11 NOTE — Telephone Encounter (Signed)
Pt made aware she is all set to proceed with the sleep study. Pt has been given PIN # [id number] and will proceed between tonight and this week with sleep study. I assured the pt that once study has been completed and read by the cardiologist we will call with the results. Pt thanked me for the call and the help.

## 2021-07-18 MED ORDER — METOPROLOL SUCCINATE ER 25 MG PO TB24: 25.0000 mg | ORAL_TABLET | Freq: Every day | ORAL | 3 refills | Status: DC

## 2021-07-18 NOTE — Telephone Encounter (Signed)
Increase Metoprolol succinate to 25 mg once daily. Have her come in for an EKG. Richardson Dopp, PA-C    07/18/2021 3:53 PM

## 2021-07-18 NOTE — Telephone Encounter (Signed)
Called patient back about Richardson Dopp PA's advisement. Patient will increase her dose of metoprolol. Patient has an appointment on 07/26/21 with [facility]. Fib clinic, will see if they can do EKG at that time.

## 2021-07-25 DIAGNOSIS — R0602 Shortness of breath: Secondary | ICD-10-CM | POA: Diagnosis not present

## 2021-07-25 DIAGNOSIS — Z79899 Other long term (current) drug therapy: Secondary | ICD-10-CM | POA: Diagnosis not present

## 2021-07-25 DIAGNOSIS — R109 Unspecified abdominal pain: Secondary | ICD-10-CM | POA: Diagnosis not present

## 2021-07-25 DIAGNOSIS — D649 Anemia, unspecified: Secondary | ICD-10-CM | POA: Diagnosis not present

## 2021-07-25 DIAGNOSIS — Z888 Allergy status to other drugs, medicaments and biological substances status: Secondary | ICD-10-CM | POA: Diagnosis not present

## 2021-07-25 DIAGNOSIS — E7800 Pure hypercholesterolemia, unspecified: Secondary | ICD-10-CM | POA: Diagnosis not present

## 2021-07-25 DIAGNOSIS — Z9049 Acquired absence of other specified parts of digestive tract: Secondary | ICD-10-CM | POA: Diagnosis not present

## 2021-07-25 DIAGNOSIS — I10 Essential (primary) hypertension: Secondary | ICD-10-CM | POA: Diagnosis not present

## 2021-07-25 DIAGNOSIS — K449 Diaphragmatic hernia without obstruction or gangrene: Secondary | ICD-10-CM | POA: Diagnosis not present

## 2021-07-25 DIAGNOSIS — Z9109 Other allergy status, other than to drugs and biological substances: Secondary | ICD-10-CM | POA: Diagnosis not present

## 2021-07-25 DIAGNOSIS — I4891 Unspecified atrial fibrillation: Secondary | ICD-10-CM | POA: Diagnosis not present

## 2021-07-25 DIAGNOSIS — Z886 Allergy status to analgesic agent status: Secondary | ICD-10-CM | POA: Diagnosis not present

## 2021-07-25 DIAGNOSIS — E119 Type 2 diabetes mellitus without complications: Secondary | ICD-10-CM | POA: Diagnosis not present

## 2021-07-25 DIAGNOSIS — Z7984 Long term (current) use of oral hypoglycemic drugs: Secondary | ICD-10-CM | POA: Diagnosis not present

## 2021-07-25 DIAGNOSIS — Z7901 Long term (current) use of anticoagulants: Secondary | ICD-10-CM | POA: Diagnosis not present

## 2021-07-25 DIAGNOSIS — Z883 Allergy status to other anti-infective agents status: Secondary | ICD-10-CM | POA: Diagnosis not present

## 2021-07-25 DIAGNOSIS — K219 Gastro-esophageal reflux disease without esophagitis: Secondary | ICD-10-CM | POA: Diagnosis not present

## 2021-07-26 ENCOUNTER — Ambulatory Visit (HOSPITAL_COMMUNITY)
Admission: RE | Admit: 2021-07-26 | Discharge: 2021-07-26 | Disposition: A | Discharge status: Home / Self Care | Payer: Medicare Other | Source: Ambulatory Visit | Attending: Physician Assistant | Admitting: Physician Assistant

## 2021-07-26 ENCOUNTER — Encounter (HOSPITAL_COMMUNITY): Payer: Self-pay | Admitting: Physician Assistant

## 2021-07-26 ENCOUNTER — Other Ambulatory Visit: Payer: Self-pay

## 2021-07-26 VITALS — BP 110/70 | HR 69 | Ht 62.0 in | Wt 207.4 lb

## 2021-07-26 DIAGNOSIS — Z885 Allergy status to narcotic agent status: Secondary | ICD-10-CM | POA: Diagnosis not present

## 2021-07-26 DIAGNOSIS — M797 Fibromyalgia: Secondary | ICD-10-CM | POA: Insufficient documentation

## 2021-07-26 DIAGNOSIS — Z7901 Long term (current) use of anticoagulants: Secondary | ICD-10-CM | POA: Insufficient documentation

## 2021-07-26 DIAGNOSIS — D649 Anemia, unspecified: Secondary | ICD-10-CM | POA: Insufficient documentation

## 2021-07-26 DIAGNOSIS — I10 Essential (primary) hypertension: Secondary | ICD-10-CM | POA: Insufficient documentation

## 2021-07-26 DIAGNOSIS — D6869 Other thrombophilia: Secondary | ICD-10-CM | POA: Diagnosis not present

## 2021-07-26 DIAGNOSIS — Z6837 Body mass index (BMI) 37.0-37.9, adult: Secondary | ICD-10-CM | POA: Insufficient documentation

## 2021-07-26 DIAGNOSIS — Z79899 Other long term (current) drug therapy: Secondary | ICD-10-CM | POA: Insufficient documentation

## 2021-07-26 DIAGNOSIS — Z888 Allergy status to other drugs, medicaments and biological substances status: Secondary | ICD-10-CM | POA: Diagnosis not present

## 2021-07-26 DIAGNOSIS — R0683 Snoring: Secondary | ICD-10-CM | POA: Insufficient documentation

## 2021-07-26 DIAGNOSIS — I4819 Other persistent atrial fibrillation: Secondary | ICD-10-CM | POA: Diagnosis not present

## 2021-07-26 DIAGNOSIS — Z853 Personal history of malignant neoplasm of breast: Secondary | ICD-10-CM | POA: Diagnosis not present

## 2021-07-26 DIAGNOSIS — E119 Type 2 diabetes mellitus without complications: Secondary | ICD-10-CM | POA: Diagnosis not present

## 2021-07-26 DIAGNOSIS — E669 Obesity, unspecified: Secondary | ICD-10-CM | POA: Insufficient documentation

## 2021-07-26 DIAGNOSIS — Z7984 Long term (current) use of oral hypoglycemic drugs: Secondary | ICD-10-CM | POA: Insufficient documentation

## 2021-07-26 MED ORDER — LOSARTAN POTASSIUM-HCTZ 100-12.5 MG PO TABS: 1.0000 | ORAL_TABLET | Freq: Every day | ORAL | 6 refills | Status: DC

## 2021-07-26 MED ORDER — POTASSIUM CHLORIDE CRYS ER 20 MEQ PO TBCR: 20.0000 meq | EXTENDED_RELEASE_TABLET | Freq: Every day | ORAL | 6 refills | Status: DC

## 2021-07-26 NOTE — Progress Notes (Addendum)
Primary Care Physician: [name], MD Primary Cardiologist: Dr Johney Frame  Primary Electrophysiologist: none Referring Physician: Richardson Dopp PA   Kayla Bennett is a 70 y.o. female with a history of HTN, DM, breast cancer, fibromyalgia, atrial fibrillation who presents for consultation in the [facility]. The patient was initially diagnosed with atrial fibrillation 03/2021 incidentally on a preoperative ECG. Patient is on Eliquis for a CHADS2VASC score of 4. She underwent DCCV 04/04/21 but was back in afib on follow up 07/10/21. She was started on flecainide and metoprolol. She was seen at the ED 07/25/21 for abdominal/right flank pain. Her Hgb was noted to be down to 8.7 (baseline around 10-11). Per ED note, rectal exam revealed no bleeding. Patient denies any bleeding issues. She does continue to feel weak and fatigued. She also reports chronic leg and foot cramping and dry mouth.   Today, she denies symptoms of palpitations, chest pain, shortness of breath, orthopnea, PND, lower extremity edema, dizziness, presyncope, syncope, snoring, daytime somnolence, bleeding, or neurologic sequela. The patient is tolerating medications without difficulties and is otherwise without complaint today.    Atrial Fibrillation Risk Factors:  she does have symptoms or diagnosis of sleep apnea. she does not have a history of rheumatic fever.   she has a BMI of Body mass index is 37.93 kg/m.Marland Kitchen Filed Weights   07/26/21 0858  Weight: 94.1 kg    Family History  Problem Relation Age of Onset   Colon cancer Father 72   Cancer Maternal Uncle        melanoma    Colon cancer Paternal Uncle    Colon cancer Cousin      Atrial Fibrillation Management history:  Previous antiarrhythmic drugs: flecainide  Previous cardioversions: 04/04/21 Previous ablations: none CHADS2VASC score: 4 Anticoagulation history: Eliquis   Past Medical History:  Diagnosis Date   Anemia     Arthritis    Asthma    Diabetes ([place])    Dyspnea    Dysrhythmia    afibb on occasion   Family history of adverse reaction to anesthesia    mother had reaction to versed...during surgery, stopped breathing   Fibromyalgia    GERD (gastroesophageal reflux disease)    Headache    History of hiatal hernia    Hypertension    Osteopenia    Psoriasis    Past Surgical History:  Procedure Laterality Date   ABDOMINAL HYSTERECTOMY     APPENDECTOMY     BREAST LUMPECTOMY WITH RADIOACTIVE SEED AND SENTINEL LYMPH NODE BIOPSY Right 02/18/2021   Procedure: RIGHT BREAST LUMPECTOMY WITH RADIOACTIVE SEED AND SENTINEL LYMPH NODE BIOPSY;  Surgeon: [name], MD;  Location: [facility];  Service: General;  Laterality: Right;   broken wrist     CARDIOVERSION N/A 04/04/2021   Procedure: CARDIOVERSION;  Surgeon: Sueanne Margarita, MD;  Location: [facility];  Service: Cardiovascular;  Laterality: N/A;   [place]     right wrist   HYSTERECTOMY ABDOMINAL WITH SALPINGO-OOPHORECTOMY     LYSIS OF ADHESION     SHOULDER SURGERY Left    TUBAL LIGATION      Current Outpatient Medications  Medication Sig Dispense Refill   acetaminophen (TYLENOL) 650 MG CR tablet Take 1,300 mg by mouth every 8 (eight) hours as needed for pain.     albuterol (VENTOLIN HFA) 108 (90 Base) MCG/ACT inhaler Inhale 2 puffs into the lungs  every 6 (six) hours as needed for wheezing or shortness of breath.     Alirocumab (PRALUENT) 150 MG/ML SOAJ Inject 150 mg into the skin every 21 ( twenty-one) days.     apixaban (ELIQUIS) 5 MG TABS tablet Take 1 tablet (5 mg total) by mouth 2 (two) times daily. Pt needs 90 day supply.  Please keep on file till next refill. 180 tablet 3   cetirizine (ZYRTEC) 10 MG tablet Take 10 mg by mouth in the morning.     cholecalciferol (VITAMIN D3) 25 MCG (1000 UNIT) tablet Take 1,000 Units by mouth in the morning and at bedtime.     flecainide  (TAMBOCOR) 50 MG tablet Take 1 tablet (50 mg total) by mouth 2 (two) times daily. 60 tablet 3   gabapentin (NEURONTIN) 100 MG capsule Take 200 mg by mouth at bedtime.     ibuprofen (ADVIL) 200 MG tablet Take 400 mg by mouth every 4 (four) hours as needed.     losartan-hydrochlorothiazide (HYZAAR) 100-25 MG tablet Take 1 tablet by mouth in the morning.     metFORMIN (GLUCOPHAGE-XR) 500 MG 24 hr tablet Take 500 mg by mouth at bedtime.     metoprolol succinate (TOPROL XL) 25 MG 24 hr tablet Take 1 tablet (25 mg total) by mouth daily. 90 tablet 3   pantoprazole (PROTONIX) 40 MG tablet Take 40 mg by mouth 2 (two) times daily before a meal.     Prenatal Vit-Fe Fumarate-FA (PRENATAL VITAMIN PLUS LOW IRON PO) Take 1 tablet by mouth in the morning.     SUMAtriptan (IMITREX) 100 MG tablet Take 100 mg by mouth every 2 (two) hours as needed for migraine. May repeat in 2 hours if headache persists or recurs.     tamoxifen (NOLVADEX) 20 MG tablet Take 1 tablet (20 mg total) by mouth daily. 30 tablet 3   tizanidine (ZANAFLEX) 2 MG capsule Take by mouth.     triamcinolone (KENALOG) 0.1 % Apply 1 application topically 2 (two) times daily as needed (psoriasis). Mixed with Cetaphil     venlafaxine XR (EFFEXOR-XR) 37.5 MG 24 hr capsule Take 37.5 mg by mouth at bedtime. Take 1 capsule (37.5 mg) + 1 capsule (75 mg) by mouth at bedtime     venlafaxine XR (EFFEXOR-XR) 75 MG 24 hr capsule Take 75 mg by mouth at bedtime. Take 1 capsule (75 mg) + 1 capsule (37.5 mg) by mouth at bedtime     vitamin C (ASCORBIC ACID) 500 MG tablet Take 500 mg by mouth daily.     No current facility-administered medications for this encounter.    Allergies  Allergen Reactions   Lotensin [Benazepril Hcl] Swelling   Morphine Itching and Rash   Erythromycin Base Other (See Comments)    unsure   Hydrocodone Other (See Comments)    Other reaction(s): Unknown Pt can't recall    Wound Dressing Adhesive     Skin burns     Social History    Socioeconomic History   Marital status: Married    Spouse name: Not on file   Number of children: 2   Years of education: Not on file   Highest education level: Not on file  Occupational History   Not on file  Tobacco Use   Smoking status: Never   Smokeless tobacco: Never  Vaping Use   Vaping Use: Never used  Substance and Sexual Activity   Alcohol use: Never   Drug use: Never   Sexual activity: Not on  file  Other Topics Concern   Not on file  Social History Narrative   Not on file   Social Determinants of Health   Financial Resource Strain: Not on file  Food Insecurity: Not on file  Transportation Needs: Not on file  Physical Activity: Not on file  Stress: Not on file  Social Connections: Not on file  Intimate Partner Violence: Not on file     ROS- All systems are reviewed and negative except as per the HPI above.  Physical Exam: Vitals:   07/26/21 0858  BP: 110/70  Pulse: 69  SpO2: 95%  Weight: 94.1 kg  Height: '5\' 2"'$  (1.575 m)    GEN- The patient is a well appearing obese female, alert and oriented x 3 today.   Head- normocephalic, atraumatic Eyes-  Sclera clear, conjunctiva pink Ears- hearing intact Oropharynx- clear Neck- supple  Lungs- Clear to ausculation bilaterally, normal work of breathing Heart- irregular rate and rhythm, no murmurs, rubs or gallops  GI- soft, NT, ND, + BS Extremities- no clubbing, cyanosis, or edema MS- no significant deformity or atrophy Skin- no rash or lesion Psych- euthymic mood, full affect Neuro- strength and sensation are intact  Wt Readings from Last 3 Encounters:  07/26/21 94.1 kg  07/10/21 94.3 kg  04/25/21 94.6 kg    EKG today demonstrates  Afib  Vent. rate 69 BPM PR interval * ms QRS duration 74 ms QT/QTcB 386/413 ms  Echo 12/21/20 demonstrated   1. Left ventricular ejection fraction, by estimation, is 50 to 55%. The  left ventricle has low normal function. The left ventricle has no regional wall  motion abnormalities. Left ventricular diastolic parameters are indeterminate.   2. Right ventricular systolic function is normal. The right ventricular  size is not well visualized.   3. Left atrial size was moderately dilated.   4. The mitral valve is normal in structure. At most moderate mitral valve regurgitation with variability in beat to beat assessment. Pulmonary vein systolic blunting. 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 No evidence of mitral stenosis.   5. The aortic valve is normal in structure. Aortic valve regurgitation is  not visualized. No aortic stenosis is present.   6. The inferior vena cava is normal in size with greater than 50%  respiratory variability, suggesting right atrial pressure of 3 mmHg.   Epic records are reviewed at length today  CHA2DS2-VASc Score = 4  The patient's score is based upon: CHF History: 0 HTN History: 1 Diabetes History: 1 Stroke History: 0 Vascular Disease History: 0 Age Score: 1 Gender Score: 1       ASSESSMENT AND PLAN: 1. Persistent Atrial Fibrillation (ICD10:  I48.19) The patient's CHA2DS2-VASc score is 4, indicating a 4.8% annual risk of stroke.   Patient remains in rate controlled afib, complicated by potentially worsening anemia. Will defer scheduling DCCV for now in case GI workup is needed. Recheck Hgb in a few days to make sure she is back to her baseline. She denies any bleeding issues. Continue flecainide 50 mg BID Continue Toprol 25 mg daily Continue Eliquis 5 mg BID  2. Secondary Hypercoagulable State (ICD10:  D68.69) The patient is at significant risk for stroke/thromboembolism based upon her CHA2DS2-VASc Score of 4.  Continue Apixaban (Eliquis).   3. Obesity Body mass index is 37.93 kg/m. Lifestyle modification was discussed at length including regular exercise and weight reduction.  4. Snoring The importance of adequate treatment of sleep apnea was discussed today in order to  improve  our ability to maintain sinus rhythm long term. Sleep study results pending.   5. HTN Decrease losartan-HCTZ to 100-12.5 mg daily given dry mouth and cramping. Start KCL 20 meq daily. Recheck bmet as above.  6. Anemia Plan as above.   Follow up in the AF clinic next week for lab work.   Addendum: Patient became dizzy on standing to leave the clinic today. BP 82/50. Given water and salty snack and monitored. Offered ED evaluation. Patient declined and was feeling better. Instructed patient to discontinue muscle relaxer. HTN medication decreased as above. ED precautions given.  Addendum 08/09/21: Workup for anemia ongoing with hematology. D/w Cira Rue NP and Dr Burr Medico, [place] to continue anticoagulation and proceed with DCCV. Patient called and DCCV arranged.    [facility] [address] [place], [place] [address] [phone] 07/26/2021 9:34 AM

## 2021-07-26 NOTE — H&P (View-Only) (Signed)
Primary Care Physician: [name], MD Primary Cardiologist: Dr Johney Frame  Primary Electrophysiologist: none Referring Physician: Richardson Dopp PA   Kayla Bennett is a 70 y.o. female with a history of HTN, DM, breast cancer, fibromyalgia, atrial fibrillation who presents for consultation in the [facility]. The patient was initially diagnosed with atrial fibrillation 03/2021 incidentally on a preoperative ECG. Patient is on Eliquis for a CHADS2VASC score of 4. She underwent DCCV 04/04/21 but was back in afib on follow up 07/10/21. She was started on flecainide and metoprolol. She was seen at the ED 07/25/21 for abdominal/right flank pain. Her Hgb was noted to be down to 8.7 (baseline around 10-11). Per ED note, rectal exam revealed no bleeding. Patient denies any bleeding issues. She does continue to feel weak and fatigued. She also reports chronic leg and foot cramping and dry mouth.   Today, she denies symptoms of palpitations, chest pain, shortness of breath, orthopnea, PND, lower extremity edema, dizziness, presyncope, syncope, snoring, daytime somnolence, bleeding, or neurologic sequela. The patient is tolerating medications without difficulties and is otherwise without complaint today.    Atrial Fibrillation Risk Factors:  she does have symptoms or diagnosis of sleep apnea. she does not have a history of rheumatic fever.   she has a BMI of Body mass index is 37.93 kg/m.Marland Kitchen Filed Weights   07/26/21 0858  Weight: 94.1 kg    Family History  Problem Relation Age of Onset   Colon cancer Father 60   Cancer Maternal Uncle        melanoma    Colon cancer Paternal Uncle    Colon cancer Cousin      Atrial Fibrillation Management history:  Previous antiarrhythmic drugs: flecainide  Previous cardioversions: 04/04/21 Previous ablations: none CHADS2VASC score: 4 Anticoagulation history: Eliquis   Past Medical History:  Diagnosis Date   Anemia     Arthritis    Asthma    Diabetes ([place])    Dyspnea    Dysrhythmia    afibb on occasion   Family history of adverse reaction to anesthesia    mother had reaction to versed...during surgery, stopped breathing   Fibromyalgia    GERD (gastroesophageal reflux disease)    Headache    History of hiatal hernia    Hypertension    Osteopenia    Psoriasis    Past Surgical History:  Procedure Laterality Date   ABDOMINAL HYSTERECTOMY     APPENDECTOMY     BREAST LUMPECTOMY WITH RADIOACTIVE SEED AND SENTINEL LYMPH NODE BIOPSY Right 02/18/2021   Procedure: RIGHT BREAST LUMPECTOMY WITH RADIOACTIVE SEED AND SENTINEL LYMPH NODE BIOPSY;  Surgeon: [name], MD;  Location: [facility];  Service: General;  Laterality: Right;   broken wrist     CARDIOVERSION N/A 04/04/2021   Procedure: CARDIOVERSION;  Surgeon: Sueanne Margarita, MD;  Location: [facility];  Service: Cardiovascular;  Laterality: N/A;   [place]     right wrist   HYSTERECTOMY ABDOMINAL WITH SALPINGO-OOPHORECTOMY     LYSIS OF ADHESION     SHOULDER SURGERY Left    TUBAL LIGATION      Current Outpatient Medications  Medication Sig Dispense Refill   acetaminophen (TYLENOL) 650 MG CR tablet Take 1,300 mg by mouth every 8 (eight) hours as needed for pain.     albuterol (VENTOLIN HFA) 108 (90 Base) MCG/ACT inhaler Inhale 2 puffs into the lungs  every 6 (six) hours as needed for wheezing or shortness of breath.     Alirocumab (PRALUENT) 150 MG/ML SOAJ Inject 150 mg into the skin every 21 ( twenty-one) days.     apixaban (ELIQUIS) 5 MG TABS tablet Take 1 tablet (5 mg total) by mouth 2 (two) times daily. Pt needs 90 day supply.  Please keep on file till next refill. 180 tablet 3   cetirizine (ZYRTEC) 10 MG tablet Take 10 mg by mouth in the morning.     cholecalciferol (VITAMIN D3) 25 MCG (1000 UNIT) tablet Take 1,000 Units by mouth in the morning and at bedtime.     flecainide  (TAMBOCOR) 50 MG tablet Take 1 tablet (50 mg total) by mouth 2 (two) times daily. 60 tablet 3   gabapentin (NEURONTIN) 100 MG capsule Take 200 mg by mouth at bedtime.     ibuprofen (ADVIL) 200 MG tablet Take 400 mg by mouth every 4 (four) hours as needed.     losartan-hydrochlorothiazide (HYZAAR) 100-25 MG tablet Take 1 tablet by mouth in the morning.     metFORMIN (GLUCOPHAGE-XR) 500 MG 24 hr tablet Take 500 mg by mouth at bedtime.     metoprolol succinate (TOPROL XL) 25 MG 24 hr tablet Take 1 tablet (25 mg total) by mouth daily. 90 tablet 3   pantoprazole (PROTONIX) 40 MG tablet Take 40 mg by mouth 2 (two) times daily before a meal.     Prenatal Vit-Fe Fumarate-FA (PRENATAL VITAMIN PLUS LOW IRON PO) Take 1 tablet by mouth in the morning.     SUMAtriptan (IMITREX) 100 MG tablet Take 100 mg by mouth every 2 (two) hours as needed for migraine. May repeat in 2 hours if headache persists or recurs.     tamoxifen (NOLVADEX) 20 MG tablet Take 1 tablet (20 mg total) by mouth daily. 30 tablet 3   tizanidine (ZANAFLEX) 2 MG capsule Take by mouth.     triamcinolone (KENALOG) 0.1 % Apply 1 application topically 2 (two) times daily as needed (psoriasis). Mixed with Cetaphil     venlafaxine XR (EFFEXOR-XR) 37.5 MG 24 hr capsule Take 37.5 mg by mouth at bedtime. Take 1 capsule (37.5 mg) + 1 capsule (75 mg) by mouth at bedtime     venlafaxine XR (EFFEXOR-XR) 75 MG 24 hr capsule Take 75 mg by mouth at bedtime. Take 1 capsule (75 mg) + 1 capsule (37.5 mg) by mouth at bedtime     vitamin C (ASCORBIC ACID) 500 MG tablet Take 500 mg by mouth daily.     No current facility-administered medications for this encounter.    Allergies  Allergen Reactions   Lotensin [Benazepril Hcl] Swelling   Morphine Itching and Rash   Erythromycin Base Other (See Comments)    unsure   Hydrocodone Other (See Comments)    Other reaction(s): Unknown Pt can't recall    Wound Dressing Adhesive     Skin burns     Social History    Socioeconomic History   Marital status: Married    Spouse name: Not on file   Number of children: 2   Years of education: Not on file   Highest education level: Not on file  Occupational History   Not on file  Tobacco Use   Smoking status: Never   Smokeless tobacco: Never  Vaping Use   Vaping Use: Never used  Substance and Sexual Activity   Alcohol use: Never   Drug use: Never   Sexual activity: Not on  file  Other Topics Concern   Not on file  Social History Narrative   Not on file   Social Determinants of Health   Financial Resource Strain: Not on file  Food Insecurity: Not on file  Transportation Needs: Not on file  Physical Activity: Not on file  Stress: Not on file  Social Connections: Not on file  Intimate Partner Violence: Not on file     ROS- All systems are reviewed and negative except as per the HPI above.  Physical Exam: Vitals:   07/26/21 0858  BP: 110/70  Pulse: 69  SpO2: 95%  Weight: 94.1 kg  Height: '5\' 2"'$  (1.575 m)    GEN- The patient is a well appearing obese female, alert and oriented x 3 today.   Head- normocephalic, atraumatic Eyes-  Sclera clear, conjunctiva pink Ears- hearing intact Oropharynx- clear Neck- supple  Lungs- Clear to ausculation bilaterally, normal work of breathing Heart- irregular rate and rhythm, no murmurs, rubs or gallops  GI- soft, NT, ND, + BS Extremities- no clubbing, cyanosis, or edema MS- no significant deformity or atrophy Skin- no rash or lesion Psych- euthymic mood, full affect Neuro- strength and sensation are intact  Wt Readings from Last 3 Encounters:  07/26/21 94.1 kg  07/10/21 94.3 kg  04/25/21 94.6 kg    EKG today demonstrates  Afib  Vent. rate 69 BPM PR interval * ms QRS duration 74 ms QT/QTcB 386/413 ms  Echo 12/21/20 demonstrated   1. Left ventricular ejection fraction, by estimation, is 50 to 55%. The  left ventricle has low normal function. The left ventricle has no regional wall  motion abnormalities. Left ventricular diastolic parameters are indeterminate.   2. Right ventricular systolic function is normal. The right ventricular  size is not well visualized.   3. Left atrial size was moderately dilated.   4. The mitral valve is normal in structure. At most moderate mitral valve regurgitation with variability in beat to beat assessment. Pulmonary vein systolic blunting. 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 No evidence of mitral stenosis.   5. The aortic valve is normal in structure. Aortic valve regurgitation is  not visualized. No aortic stenosis is present.   6. The inferior vena cava is normal in size with greater than 50%  respiratory variability, suggesting right atrial pressure of 3 mmHg.   Epic records are reviewed at length today  CHA2DS2-VASc Score = 4  The patient's score is based upon: CHF History: 0 HTN History: 1 Diabetes History: 1 Stroke History: 0 Vascular Disease History: 0 Age Score: 1 Gender Score: 1       ASSESSMENT AND PLAN: 1. Persistent Atrial Fibrillation (ICD10:  I48.19) The patient's CHA2DS2-VASc score is 4, indicating a 4.8% annual risk of stroke.   Patient remains in rate controlled afib, complicated by potentially worsening anemia. Will defer scheduling DCCV for now in case GI workup is needed. Recheck Hgb in a few days to make sure she is back to her baseline. She denies any bleeding issues. Continue flecainide 50 mg BID Continue Toprol 25 mg daily Continue Eliquis 5 mg BID  2. Secondary Hypercoagulable State (ICD10:  D68.69) The patient is at significant risk for stroke/thromboembolism based upon her CHA2DS2-VASc Score of 4.  Continue Apixaban (Eliquis).   3. Obesity Body mass index is 37.93 kg/m. Lifestyle modification was discussed at length including regular exercise and weight reduction.  4. Snoring The importance of adequate treatment of sleep apnea was discussed today in order to  improve  our ability to maintain sinus rhythm long term. Sleep study results pending.   5. HTN Decrease losartan-HCTZ to 100-12.5 mg daily given dry mouth and cramping. Start KCL 20 meq daily. Recheck bmet as above.  6. Anemia Plan as above.   Follow up in the AF clinic next week for lab work.   Addendum: Patient became dizzy on standing to leave the clinic today. BP 82/50. Given water and salty snack and monitored. Offered ED evaluation. Patient declined and was feeling better. Instructed patient to discontinue muscle relaxer. HTN medication decreased as above. ED precautions given.  Addendum 08/09/21: Workup for anemia ongoing with hematology. D/w Cira Rue NP and Dr Burr Medico, [place] to continue anticoagulation and proceed with DCCV. Patient called and DCCV arranged.    [facility] [address] [place], [place] [address] [phone] 07/26/2021 9:34 AM

## 2021-07-26 NOTE — Patient Instructions (Signed)
Start Potassium  12mq- Taking one tablet by mouth once daily Stop Losartan/HCTZ- '100mg'$ /'25mg'$  Decrease Losartan/ HCTZ- 100-12.'5mg'$ - Taking one tablet by mouth once daily

## 2021-07-26 NOTE — Addendum Note (Signed)
Encounter addended by: [name], PA on: 07/26/2021 2:02 PM  Actions taken: Clinical Note Signed

## 2021-07-29 ENCOUNTER — Inpatient Hospital Stay: Payer: Medicare Other

## 2021-07-29 ENCOUNTER — Other Ambulatory Visit: Payer: Self-pay

## 2021-07-29 ENCOUNTER — Inpatient Hospital Stay: Payer: Medicare Other | Attending: Radiation Oncology | Admitting: Nurse Practitioner

## 2021-07-29 ENCOUNTER — Encounter: Payer: Self-pay | Admitting: Nurse Practitioner

## 2021-07-29 VITALS — BP 119/79 | HR 78 | Temp 97.7°F | Resp 18 | Wt 207.9 lb

## 2021-07-29 DIAGNOSIS — I4891 Unspecified atrial fibrillation: Secondary | ICD-10-CM | POA: Diagnosis not present

## 2021-07-29 DIAGNOSIS — Z90710 Acquired absence of both cervix and uterus: Secondary | ICD-10-CM | POA: Diagnosis not present

## 2021-07-29 DIAGNOSIS — E119 Type 2 diabetes mellitus without complications: Secondary | ICD-10-CM | POA: Insufficient documentation

## 2021-07-29 DIAGNOSIS — C50411 Malignant neoplasm of upper-outer quadrant of right female breast: Secondary | ICD-10-CM | POA: Diagnosis not present

## 2021-07-29 DIAGNOSIS — Z170 Estrogen receptor positive status [ER+]: Secondary | ICD-10-CM

## 2021-07-29 DIAGNOSIS — M8580 Other specified disorders of bone density and structure, unspecified site: Secondary | ICD-10-CM | POA: Insufficient documentation

## 2021-07-29 DIAGNOSIS — Z923 Personal history of irradiation: Secondary | ICD-10-CM | POA: Insufficient documentation

## 2021-07-29 DIAGNOSIS — Z90721 Acquired absence of ovaries, unilateral: Secondary | ICD-10-CM | POA: Insufficient documentation

## 2021-07-29 DIAGNOSIS — Z9221 Personal history of antineoplastic chemotherapy: Secondary | ICD-10-CM | POA: Diagnosis not present

## 2021-07-29 DIAGNOSIS — R6889 Other general symptoms and signs: Secondary | ICD-10-CM | POA: Diagnosis not present

## 2021-07-29 DIAGNOSIS — Z7984 Long term (current) use of oral hypoglycemic drugs: Secondary | ICD-10-CM | POA: Diagnosis not present

## 2021-07-29 DIAGNOSIS — Z7901 Long term (current) use of anticoagulants: Secondary | ICD-10-CM | POA: Insufficient documentation

## 2021-07-29 DIAGNOSIS — D649 Anemia, unspecified: Secondary | ICD-10-CM | POA: Diagnosis not present

## 2021-07-29 DIAGNOSIS — I10 Essential (primary) hypertension: Secondary | ICD-10-CM | POA: Insufficient documentation

## 2021-07-29 DIAGNOSIS — Z79899 Other long term (current) drug therapy: Secondary | ICD-10-CM | POA: Insufficient documentation

## 2021-07-29 LAB — RETIC PANEL
Immature Retic Fract: 25.7 % — ABNORMAL HIGH (ref 2.3–15.9)
RBC.: 3.59 MIL/uL — ABNORMAL LOW (ref 3.87–5.11)
Retic Count, Absolute: 110.2 10*3/uL (ref 19.0–186.0)
Retic Ct Pct: 3.1 % (ref 0.4–3.1)
Reticulocyte Hemoglobin: 23.8 pg — ABNORMAL LOW (ref >27.9)

## 2021-07-29 LAB — CMP (CANCER CENTER ONLY)
ALT: 22 U/L (ref 0–44)
AST: 30 U/L (ref 15–41)
Albumin: 3.6 g/dL (ref 3.5–5.0)
Alkaline Phosphatase: 64 U/L (ref 38–126)
Anion gap: 10 (ref 5–15)
BUN: 17 mg/dL (ref 8–23)
CO2: 27 mmol/L (ref 22–32)
Calcium: 9.1 mg/dL (ref 8.9–10.3)
Chloride: 103 mmol/L (ref 98–111)
Creatinine: 0.85 mg/dL (ref 0.44–1.00)
GFR, Estimated: >60 mL/min (ref >60)
Glucose, Bld: 97 mg/dL (ref 70–99)
Potassium: 4.2 mmol/L (ref 3.5–5.1)
Sodium: 140 mmol/L (ref 135–145)
Total Bilirubin: 0.6 mg/dL (ref 0.3–1.2)
Total Protein: 7.4 g/dL (ref 6.5–8.1)

## 2021-07-29 LAB — CBC WITH DIFFERENTIAL (CANCER CENTER ONLY)
Abs Immature Granulocytes: 0.01 10*3/uL (ref 0.00–0.07)
Basophils Absolute: 0 10*3/uL (ref 0.0–0.1)
Basophils Relative: 1 %
Eosinophils Absolute: 0.1 10*3/uL (ref 0.0–0.5)
Eosinophils Relative: 1 %
HCT: 29.8 % — ABNORMAL LOW (ref 36.0–46.0)
Hemoglobin: 9.1 g/dL — ABNORMAL LOW (ref 12.0–15.0)
Immature Granulocytes: 0 %
Lymphocytes Relative: 27 %
Lymphs Abs: 1.7 10*3/uL (ref 0.7–4.0)
MCH: 25.3 pg — ABNORMAL LOW (ref 26.0–34.0)
MCHC: 30.5 g/dL (ref 30.0–36.0)
MCV: 83 fL (ref 80.0–100.0)
Monocytes Absolute: 0.6 10*3/uL (ref 0.1–1.0)
Monocytes Relative: 9 %
Neutro Abs: 3.9 10*3/uL (ref 1.7–7.7)
Neutrophils Relative %: 62 %
Platelet Count: 286 10*3/uL (ref 150–400)
RBC: 3.59 MIL/uL — ABNORMAL LOW (ref 3.87–5.11)
RDW: 19 % — ABNORMAL HIGH (ref 11.5–15.5)
WBC Count: 6.3 10*3/uL (ref 4.0–10.5)
nRBC: 0 % (ref 0.0–0.2)

## 2021-07-29 LAB — VITAMIN B12: Vitamin B-12: 313 pg/mL (ref 180–914)

## 2021-07-29 NOTE — Progress Notes (Signed)
CLINIC:  Survivorship   Patient Care Team: Lasandra Beech, MD as PCP - General (Family Medicine) Meriam Sprague, MD as PCP - Cardiology (Cardiology) Pershing Proud, RN as Oncology Nurse Navigator Donnelly Angelica, RN as Oncology Nurse Navigator Almond Lint, MD as Consulting Physician (General Surgery) Malachy Mood, MD as Consulting Physician (Hematology) Lonie Peak, MD as Attending Physician (Radiation Oncology) Kennon Rounds as Physician Assistant (Cardiology) Pollyann Samples, NP as Nurse Practitioner (Nurse Practitioner)  REASON FOR VISIT:  Routine follow-up post-treatment for a recent history of breast cancer.  BRIEF ONCOLOGIC HISTORY:  Oncology History Overview Note  Cancer Staging Malignant neoplasm of upper-outer quadrant of right breast in female, estrogen receptor positive (HCC) Staging form: Breast, AJCC 8th Edition - Clinical stage from 11/22/2020: Stage IA (cT1b, cN0, cM0, G2, ER+, PR+, HER2-) - Signed by Malachy Mood, MD on 12/04/2020 Stage prefix: Initial diagnosis - Pathologic: Stage IA (pT1c, pN0, cM0, G2, ER+, PR+, HER2-) - Signed by Loa Socks, NP on 02/27/2021 Histologic grading system: 3 grade system    Malignant neoplasm of upper-outer quadrant of right breast in female, estrogen receptor positive (HCC)  11/05/2020 Breast US   US Breast 11/05/20  IMPRESSION No Sonographic correlate is seen for the round microlobulated 53mm mass in the upper outer right breast 10cm from the nipple, 9: 00 position.  Given this mass is new on mammography and has suspicious borders, a stereotactic guided biopsy is recommended.     11/22/2020 Cancer Staging   Staging form: Breast, AJCC 8th Edition - Clinical stage from 11/22/2020: Stage IA (cT1b, cN0, cM0, G2, ER+, PR+, HER2-) - Signed by Malachy Mood, MD on 12/04/2020   11/22/2020 Initial Biopsy    Diagnosis 11/22/20  Breast, right, needle core biopsy, 10 cmfn, upper outer quadrant, post depth - INVASIVE  DUCTAL CARCINOMA. SEE NOTE Diagnosis Note Carcinoma measures 0.7 cm in greatest linear dimension and appears grade 2. Dr. Berneice Heinrich reviewed the case and concurs with the diagnosis. A breast prognostic profile (ER, PR, Ki-67 and HER2) is pending and will be reported in an addendum. Dr. Juanetta Gosling was notified on 11/23/2020.   11/22/2020 Receptors her2    ROGNOSTIC INDICATORS Results: IMMUNOHISTOCHEMICAL AND MORPHOMETRIC ANALYSIS PERFORMED MANUALLY The tumor cells are NEGATIVE for Her2 (1+). Estrogen Receptor: 95%, POSITIVE, STRONG STAINING INTENSITY Progesterone Receptor: 95%, POSITIVE, STRONG STAINING INTENSITY Proliferation Marker Ki67: 10%   11/28/2020 Initial Diagnosis   Malignant neoplasm of upper-outer quadrant of right breast in female, estrogen receptor positive (HCC)   12/22/2020 Genetic Testing   Negative hereditary cancer genetic testing: no pathogenic variants detected in [facility] Common Hereditary Cancers Panel + Melanoma Genes.  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  The report date is December 22, 2020.    The Common Hereditary Cancers Panel+Melanoma Genes offered by [facility] includes sequencing and/or deletion duplication testing of the following 52 genes: APC, ATM, AXIN2, BAP1, BARD1, BMPR1A, BRCA1, BRCA2, BRIP1, CDH1, CDK4, CDKN2A (p14ARF), CDKN2A (p16INK4a), CHEK2, CTNNA1, DICER1, EPCAM (Deletion/duplication testing only), GREM1 (promoter region deletion/duplication testing only), HOXB13, KIT, MEN1, MITF, MLH1, MSH2, MSH3, MSH6, MUTYH, NBN, NF1, NHTL1, PALB2, PDGFRA, PMS2, POLD1, POLE, PTEN, POT1, RAD50, RAD51C, RAD51D, RB1, RNF43, SDHA, SDHB, SDHC, SDHD, SMAD4, SMARCA4, STK11, TP53, TSC1, TSC2, and VHL.  The following genes were evaluated for sequence changes only: SDHA and HOXB13 c.251G>A variant only.   02/18/2021 Surgery   FINAL MICROSCOPIC DIAGNOSIS:   A. BREAST, RIGHT, LUMPECTOMY:  - Invasive ductal carcinoma, 1.2  cm, grade 2  - Ductal  carcinoma in situ, intermediate grade  - Resection margins are negative for carcinoma; posterior margin is less than 1 mm from carcinoma  - Biopsy site changes  - See oncology table   B. LYMPH NODE, RIGHT AXILLARY #1, SENTINEL, EXCISION:  - Lymph node, negative for carcinoma (0/1)   C. LYMPH NODE, RIGHT AXILLARY #2, SENTINEL, EXCISION:  - Lymph node, negative for carcinoma (0/1)   D. LYMPH NODE, RIGHT AXILLARY, SENTINEL, EXCISION:  - Lymph node, negative for carcinoma (0/1)   E. LYMPH NODE, RIGHT AXILLARY #3, SENTINEL, EXCISION:  - Lymph node, negative for carcinoma (0/1)   F. LYMPH NODE, RIGHT AXILLARY, SENTINEL, EXCISION:  - Lymph node, negative for carcinoma (0/1)     02/18/2021 Oncotype testing   Oncotype DX was obtained on the final surgical sample and the recurrence score of 0 predicts a risk of recurrence outside the breast over the next 9 years of 3%, if the patient's only systemic therapy is an antiestrogen for 5 years.  It also predicts no benefit from chemotherapy.   02/27/2021 Cancer Staging   Staging form: Breast, AJCC 8th Edition - Pathologic: Stage IA (pT1c, pN0, cM0, G2, ER+, PR+, HER2-) - Signed by Gardenia Phlegm, NP on 02/27/2021 Histologic grading system: 3 grade system   03/2021 - 04/2021 Radiation Therapy   Completed adjuvant Radiation by Dr. Isidore Moos   05/2021 -  Neo-Adjuvant Anti-estrogen oral therapy   Began tamoxifen mid July 2022   07/29/2021 Survivorship   SCP delivered by Cira Rue, NP     INTERVAL HISTORY:  Kayla Bennett presents to the [facility] today for our initial meeting to review her survivorship care plan detailing her treatment course for breast cancer, as well as monitoring long-term side effects of that treatment, education regarding health maintenance, screening, and overall wellness and health promotion.     Overall, Kayla Bennett is not feeling well. She was late due to exertional dyspnea that requires her to rest often. She  has fatigue and weakness that worsened in the past 2-3 weeks. She had a febrile non-covid illness in August but recovered. Denies cough. She is dealing with Afib and medication. She has difficulty losing weight. She has frequent hot flashes 2-3 times per night. She has a breast mass in the right upper quadrant since surgery, not changing. She was told her anemia has worsened but not sure who is managing. She has been on prenatal vitamin since 12/2020. She reports black watery diarrhea from her cholesterol injection.  Otherwise, denies concerns in her breasts such as new lump/mass, bone/joint pain, bleeding, signs of thrombosis.    ONCOLOGY TREATMENT TEAM:  1. Surgeon:  Dr. [facility]es at [facility] 2. Medical Oncologist: Dr. Burr Medico  3. Radiation Oncologist: Dr. Isidore Moos    PAST MEDICAL/SURGICAL HISTORY:  Past Medical History:  Diagnosis Date   Anemia    Arthritis    Asthma    Diabetes ([place])    Dyspnea    Dysrhythmia    afibb on occasion   Family history of adverse reaction to anesthesia    mother had reaction to versed...during surgery, stopped breathing   Fibromyalgia    GERD (gastroesophageal reflux disease)    Headache    History of hiatal hernia    Hypertension    Osteopenia    Psoriasis    Past Surgical History:  Procedure Laterality Date   ABDOMINAL HYSTERECTOMY     APPENDECTOMY     BREAST LUMPECTOMY  WITH RADIOACTIVE SEED AND SENTINEL LYMPH NODE BIOPSY Right 02/18/2021   Procedure: RIGHT BREAST LUMPECTOMY WITH RADIOACTIVE SEED AND SENTINEL LYMPH NODE BIOPSY;  Surgeon: [name], MD;  Location: [facility];  Service: General;  Laterality: Right;   broken wrist     CARDIOVERSION N/A 04/04/2021   Procedure: CARDIOVERSION;  Surgeon: Sueanne Margarita, MD;  Location: [place];  Service: Cardiovascular;  Laterality: N/A;   [place]     right wrist   HYSTERECTOMY ABDOMINAL WITH SALPINGO-OOPHORECTOMY     LYSIS  OF ADHESION     SHOULDER SURGERY Left    TUBAL LIGATION       ALLERGIES:  Allergies  Allergen Reactions   Lotensin [Benazepril Hcl] Swelling   Morphine Itching and Rash   Erythromycin Base Other (See Comments)    unsure   Hydrocodone Other (See Comments)    Other reaction(s): Unknown Pt can't recall    Wound Dressing Adhesive     Skin burns      CURRENT MEDICATIONS:  Outpatient Encounter Medications as of 07/29/2021  Medication Sig   acetaminophen (TYLENOL) 650 MG CR tablet Take 1,300 mg by mouth every 8 (eight) hours as needed for pain.   albuterol (VENTOLIN HFA) 108 (90 Base) MCG/ACT inhaler Inhale 2 puffs into the lungs every 6 (six) hours as needed for wheezing or shortness of breath.   Alirocumab (PRALUENT) 150 MG/ML SOAJ Inject 150 mg into the skin every 21 ( twenty-one) days.   apixaban (ELIQUIS) 5 MG TABS tablet Take 1 tablet (5 mg total) by mouth 2 (two) times daily. Pt needs 90 day supply.  Please keep on file till next refill.   cetirizine (ZYRTEC) 10 MG tablet Take 10 mg by mouth in the morning.   cholecalciferol (VITAMIN D3) 25 MCG (1000 UNIT) tablet Take 1,000 Units by mouth in the morning and at bedtime.   flecainide (TAMBOCOR) 50 MG tablet Take 1 tablet (50 mg total) by mouth 2 (two) times daily.   gabapentin (NEURONTIN) 100 MG capsule Take 200 mg by mouth at bedtime.   ibuprofen (ADVIL) 200 MG tablet Take 400 mg by mouth every 4 (four) hours as needed.   losartan-hydrochlorothiazide (HYZAAR) 100-12.5 MG tablet Take 1 tablet by mouth daily.   metFORMIN (GLUCOPHAGE-XR) 500 MG 24 hr tablet Take 500 mg by mouth at bedtime.   metoprolol succinate (TOPROL XL) 25 MG 24 hr tablet Take 1 tablet (25 mg total) by mouth daily.   pantoprazole (PROTONIX) 40 MG tablet Take 40 mg by mouth 2 (two) times daily before a meal.   potassium chloride SA (KLOR-CON M20) 20 MEQ tablet Take 1 tablet (20 mEq total) by mouth daily.   Prenatal Vit-Fe Fumarate-FA (PRENATAL VITAMIN PLUS LOW  IRON PO) Take 1 tablet by mouth in the morning.   SUMAtriptan (IMITREX) 100 MG tablet Take 100 mg by mouth every 2 (two) hours as needed for migraine. May repeat in 2 hours if headache persists or recurs.   tamoxifen (NOLVADEX) 20 MG tablet Take 1 tablet (20 mg total) by mouth daily.   tizanidine (ZANAFLEX) 2 MG capsule Take by mouth.   triamcinolone (KENALOG) 0.1 % Apply 1 application topically 2 (two) times daily as needed (psoriasis). Mixed with Cetaphil   venlafaxine XR (EFFEXOR-XR) 37.5 MG 24 hr capsule Take 37.5 mg by mouth at bedtime. Take 1 capsule (37.5 mg) + 1 capsule (75 mg) by mouth at bedtime  venlafaxine XR (EFFEXOR-XR) 75 MG 24 hr capsule Take 75 mg by mouth at bedtime. Take 1 capsule (75 mg) + 1 capsule (37.5 mg) by mouth at bedtime   vitamin C (ASCORBIC ACID) 500 MG tablet Take 500 mg by mouth daily.   No facility-administered encounter medications on file as of 07/29/2021.     ONCOLOGIC FAMILY HISTORY:  Family History  Problem Relation Age of Onset   Colon cancer Father 85   Cancer Maternal Uncle        melanoma    Colon cancer Paternal Uncle    Colon cancer Cousin      GENETIC COUNSELING/TESTING: Yes, VUS in PALB2   SOCIAL HISTORY:  Kayla Bennett is married and lives with her spouse in [place]. She denies any current or history of tobacco, alcohol, or illicit drug use.     PHYSICAL EXAMINATION:  Vital Signs:   Vitals:   07/29/21 1319 07/29/21 1400  BP: (!) 141/127 119/79  Pulse: 80 78  Resp: 18   Temp: 97.7 F (36.5 C)   SpO2: 95%    Filed Weights   07/29/21 1319  Weight: 207 lb 14.4 oz (94.3 kg)   General: well-appearing female in no acute distress.    HEENT:   Sclerae anicteric.  Lymph: No cervical, supraclavicular, or infraclavicular lymphadenopathy noted on palpation.  Respiratory: breathing non-labored.  GI: Abdomen soft and round; non-tender, non-distended. Bowel sounds normoactive.  Neuro: No focal deficits. Steady gait.   Psych: Mood and affect normal and appropriate for situation.  Extremities: No edema. MSK: limited LUE ROM Skin: Warm and dry. Breast exam: breast are symmetric without nipple discharge. Nipple inversion at baseline. S/p right lumpectomy, incisions healed. Large 6-7 cm seroma involving the upper outer and central breast. Left breast slightly firm with lumpy tissue. No palpable mass in either breast or axilla that I could appreciate.   LABORATORY DATA:  CBC Latest Ref Rng & Units 07/29/2021 07/10/2021 03/28/2021  WBC 4.0 - 10.5 K/uL 6.3 5.3 6.7  Hemoglobin 12.0 - 15.0 g/dL 9.1(L) 10.1(L) 9.8(L)  Hematocrit 36.0 - 46.0 % 29.8(L) 32.6(L) 31.7(L)  Platelets 150 - 400 K/uL 286 233 279    CMP Latest Ref Rng & Units 07/29/2021 07/10/2021 03/28/2021  Glucose 70 - 99 mg/dL 97 140(H) 132(H)  BUN 8 - 23 mg/dL $Remove'17 17 17  'NlyDJiJ$ Creatinine 0.44 - 1.00 mg/dL 0.85 0.81 0.75  Sodium 135 - 145 mmol/L 140 141 139  Potassium 3.5 - 5.1 mmol/L 4.2 4.1 4.1  Chloride 98 - 111 mmol/L 103 98 98  CO2 22 - 32 mmol/L $RemoveB'27 29 26  'xDmIOhwU$ Calcium 8.9 - 10.3 mg/dL 9.1 8.9 9.1  Total Protein 6.5 - 8.1 g/dL 7.4 - -  Total Bilirubin 0.3 - 1.2 mg/dL 0.6 - -  Alkaline Phos 38 - 126 U/L 64 - -  AST 15 - 41 U/L 30 - -  ALT 0 - 44 U/L 22 - -    DIAGNOSTIC IMAGING:  None for this visit.    ASSESSMENT AND PLAN:  Ms.. Bennett is a pleasant 70 y.o. female with Stage IA right breast invasive ductal carcinoma, ER+/PR+/HER2-, diagnosed in 11/2020, treated with lumpectomy, adjuvant radiation therapy, and anti-estrogen therapy with Tamoxifen beginning in 05/2021.  She presents to the Survivorship Clinic for our initial meeting and routine follow-up post-completion of treatment for breast cancer.    1. Stage IA left breast cancer:  Kayla Bennett has recovered well from definitive treatment for breast cancer. She will follow-up  with her medical oncologist, Dr. Burr Medico in 10/2021 with history and physical exam per surveillance protocol.  She will continue her  anti-estrogen therapy with Tamoxifen. Thus far, she is tolerating moderately well with hot flashes. She was on effexor prior to starting, continue. She was instructed to make Dr. Burr Medico or myself aware if she begins to experience any worsening side effects of the medication and I could see her back in clinic to help manage those side effects, as needed. Though the incidence is low, there is an associated risk of endometrial cancer with anti-estrogen therapies like Tamoxifen.  Kayla Bennett was encouraged to contact Dr. Burr Medico or myself with any vaginal bleeding while taking Tamoxifen. Other side effects of Tamoxifen were again reviewed with her as well. Breast exam shows a large post op seroma, will refer her back to Dr. Barry Dienes to discuss management options. Today, a comprehensive survivorship care plan and treatment summary was reviewed with the patient today detailing her breast cancer diagnosis, treatment course, potential late/long-term effects of treatment, appropriate follow-up care with recommendations for the future, and patient education resources.  A copy of this summary, along with a letter will be sent to the patient's primary care provider via In Basket message after today's visit.    2. Anemia, unspecified type: She had mild normocytic anemia Hgb 10.7 on 12/05/20 when she presented to oncology for breast cancer. No previous work up to review in [employer]. She has been on prenatal vitamin since then. Anemia worsened lately on labs at Afib clinic, Hgb 8.7 on 07/25/21. Denies bleeding but reports black diarrhea from cholesterol med/inj Praluent She is symptomatic from anemia with fatigue and dyspnea on exertion. I will start work up today with CBC, CMP, retic, W11, folic acid, MMA, iron studies, and stool/FOBT. I will call her with results.  3. A-fib: managed by cardiology; on eliquis,  tambocor, hyzaar, toprol XL  4. Bone health:  Given Kayla Bennett's age/history of breast cancer and osteopenia, she is at risk for  bone demineralization.  Her last DEXA scan was 10/29/2017 which shows lowest T score -1.3 at R femur neck. She continues calcium and vitamin D. I encouraged her to increase her weight-bearing activities.  She was given education on specific activities to promote bone health.  5. Cancer screening:  Due to Kayla Bennett's history and her age, she should receive screening for skin cancers and colon cancer. She is s/p total hysterectomy. If she has IDA and/or +FOBT will refer her back to Dr. Collene Mares to discuss colonoscopy. The information and recommendations are listed on the patient's comprehensive care plan/treatment summary and were reviewed in detail with the patient.    6. Health maintenance and wellness promotion: Kayla Bennett was encouraged to consume 5-7 servings of fruits and vegetables per day. We reviewed the "Nutrition Rainbow" handout.  She was also encouraged to engage in moderate to vigorous exercise for 30 minutes per day most days of the week. We discussed the [employer] fitness program, which is designed for cancer survivors to help them become more physically fit after cancer treatments.  She was instructed to limit her alcohol consumption and continue to abstain from tobacco use.     7. Support services/counseling: It is not uncommon for this period of the patient's cancer care trajectory to be one of many emotions and stressors.  We discussed an opportunity for her to participate in the next session of [facility] ("Finding Your New Normal") support group series designed for patients after they have  completed treatment.   Kayla Bennett was encouraged to take advantage of our many other support services programs, support groups, and/or counseling in coping with her new life as a cancer survivor after completing anti-cancer treatment.  She was offered support today through active listening and expressive supportive counseling.  She was given information regarding our available services and encouraged to  contact me with any questions or for help enrolling in any of our support group/programs.    Dispo:   -lab today for anemia work up, will call her with results -Return to cancer center 10/25/21 for surveillance visit, or sooner if needed  -Mammogram due in 10/2021 -Follow up with Dr. Barry Dienes re: seroma. Will cc my note  -Continue Tamoxifen  -She is welcome to return back to the Survivorship Clinic at any time; no additional follow-up needed at this time.  -Consider referral back to survivorship as a long-term survivor for continued surveillance  A total of (40) minutes of face-to-face time was spent with this patient with greater than 50% of that time in counseling and care-coordination.   Cira Rue, NP Survivorship Program [facility] [phone]   Note: PRIMARY CARE PROVIDER [name], [place] [phone]

## 2021-07-30 ENCOUNTER — Encounter (HOSPITAL_COMMUNITY): Payer: Self-pay

## 2021-07-30 ENCOUNTER — Ambulatory Visit (HOSPITAL_COMMUNITY)
Admission: RE | Admit: 2021-07-30 | Discharge: 2021-07-30 | Disposition: A | Discharge status: Home / Self Care | Payer: Medicare Other | Source: Ambulatory Visit | Attending: Physician Assistant | Admitting: Physician Assistant

## 2021-07-30 ENCOUNTER — Encounter: Payer: Self-pay | Admitting: Nurse Practitioner

## 2021-07-30 LAB — FOLATE RBC
Folate, Hemolysate: 450 ng/mL
Folate, RBC: 1557 ng/mL (ref >498)
Hematocrit: 28.9 % — ABNORMAL LOW (ref 34.0–46.6)

## 2021-07-30 LAB — IRON AND TIBC
Iron: 40 ug/dL (ref 28–170)
Saturation Ratios: 5 % — ABNORMAL LOW (ref 10.4–31.8)
TIBC: 751 ug/dL — ABNORMAL HIGH (ref 250–450)
UIBC: 711 ug/dL

## 2021-07-30 LAB — FERRITIN: Ferritin: 18 ng/mL (ref 11–307)

## 2021-07-31 LAB — METHYLMALONIC ACID, SERUM: Methylmalonic Acid, Quantitative: 429 nmol/L — ABNORMAL HIGH (ref 0–378)

## 2021-08-03 ENCOUNTER — Encounter: Payer: Self-pay | Admitting: Nurse Practitioner

## 2021-08-05 ENCOUNTER — Telehealth (HOSPITAL_COMMUNITY): Payer: Self-pay | Admitting: *Deleted

## 2021-08-05 DIAGNOSIS — C50411 Malignant neoplasm of upper-outer quadrant of right female breast: Secondary | ICD-10-CM | POA: Diagnosis not present

## 2021-08-05 DIAGNOSIS — Z9889 Other specified postprocedural states: Secondary | ICD-10-CM | POA: Diagnosis not present

## 2021-08-05 DIAGNOSIS — D649 Anemia, unspecified: Secondary | ICD-10-CM | POA: Diagnosis not present

## 2021-08-05 DIAGNOSIS — Z170 Estrogen receptor positive status [ER+]: Secondary | ICD-10-CM | POA: Diagnosis not present

## 2021-08-05 DIAGNOSIS — R0602 Shortness of breath: Secondary | ICD-10-CM | POA: Diagnosis not present

## 2021-08-05 NOTE — Telephone Encounter (Signed)
-----   Message from [name], [place] sent at 08/05/2021  3:54 PM EDT ----- Dear Berton Mount and whoever is covering Dr. Johney Frame,  This lady is a patient over there with Afib and HTN.  She has been having significant shortness of breath (20 steps) and near syncope since her meds were changed.  She is anemic as well which onc is working up, but HCT is 29.8, so not as likely to be at a syncopal level.    Can you guys see her?  She says she has sent a mychart message without any reply.  Thanks FB

## 2021-08-05 NOTE — Telephone Encounter (Signed)
Spoke to patient. She is aware to stop flecainide and metoprolol. She is going to call to afib clinic tomorrow after her morning appt at 12pm to report response to stopping the two medications. Pt verbalized understanding.

## 2021-08-05 NOTE — Telephone Encounter (Signed)
Received below message regarding weakness and continued shortness of breath. Per [name] will plan to stop metoprolol and flecainide.  I have left Kayla Bennett a voicemail to convey this information. Will await return call.

## 2021-08-06 ENCOUNTER — Telehealth: Payer: Self-pay

## 2021-08-06 NOTE — Telephone Encounter (Signed)
Per DPR, I left a detailed message on the patients mobile voicemail explaining her lab results and Lacie's recommendations. I encouraged the patient to call back with any questions or concerns.

## 2021-08-06 NOTE — Telephone Encounter (Signed)
-----   Message from Alla Feeling, NP sent at 08/06/2021  6:20 AM EDT ----- Please let pt know she has evidence of B12 and iron deficiencies, and to start oral B12 and iron once daily. I will be in touch with her about further anemia work up. Continue med recommendations/adjustments per Afib clinic as well.  Thanks, Regan Rakers, NP

## 2021-08-09 ENCOUNTER — Telehealth (HOSPITAL_COMMUNITY): Payer: Self-pay | Admitting: *Deleted

## 2021-08-09 MED ORDER — METOPROLOL SUCCINATE ER 25 MG PO TB24: 25.0000 mg | ORAL_TABLET | Freq: Every day | ORAL | Status: DC

## 2021-08-09 MED ORDER — FLECAINIDE ACETATE 50 MG PO TABS: 50.0000 mg | ORAL_TABLET | Freq: Two times a day (BID) | ORAL | 3 refills | Status: DC

## 2021-08-09 NOTE — Addendum Note (Signed)
Encounter addended by: [name], PA on: 08/09/2021 1:33 PM  Actions taken: Clinical Note Signed

## 2021-08-09 NOTE — Telephone Encounter (Signed)
Patient scheduled for cardioversion 9/28 no missed doses of eliquis in the last 3 weeks. NPO after MN, sip of water with meds. Arrival at 72am. Recent labs in epic. Pt instructed to resume metoprolol 25mg  once a day and flecainide 50mg  twice a day 3 days prior to cardioversion. Appt with Richardson Dopp in place post cardioversion for 10/7. Pt verbalized understanding of all instructions.

## 2021-08-12 ENCOUNTER — Other Ambulatory Visit: Payer: Self-pay | Admitting: Nurse Practitioner

## 2021-08-12 ENCOUNTER — Ambulatory Visit (HOSPITAL_COMMUNITY): Payer: Medicare Other | Admitting: Physician Assistant

## 2021-08-12 ENCOUNTER — Telehealth: Payer: Self-pay | Admitting: Nurse Practitioner

## 2021-08-12 DIAGNOSIS — E538 Deficiency of other specified B group vitamins: Secondary | ICD-10-CM

## 2021-08-12 DIAGNOSIS — D649 Anemia, unspecified: Secondary | ICD-10-CM

## 2021-08-12 NOTE — Telephone Encounter (Signed)
Scheduled appts per sch msg. Called and left msg

## 2021-08-13 ENCOUNTER — Other Ambulatory Visit (HOSPITAL_COMMUNITY): Payer: Self-pay | Admitting: *Deleted

## 2021-08-14 ENCOUNTER — Other Ambulatory Visit: Payer: Self-pay

## 2021-08-14 ENCOUNTER — Ambulatory Visit (HOSPITAL_COMMUNITY): Payer: Medicare Other | Admitting: Certified Registered Nurse Anesthetist

## 2021-08-14 ENCOUNTER — Encounter (HOSPITAL_COMMUNITY)
Admission: RE | Disposition: A | Discharge status: Home / Self Care | Payer: Self-pay | Source: Home / Self Care | Attending: Cardiovascular Disease

## 2021-08-14 ENCOUNTER — Ambulatory Visit (HOSPITAL_COMMUNITY)
Admission: RE | Admit: 2021-08-14 | Discharge: 2021-08-14 | Disposition: A | Discharge status: Home / Self Care | Payer: Medicare Other | Attending: Cardiovascular Disease | Admitting: Cardiovascular Disease

## 2021-08-14 ENCOUNTER — Encounter (HOSPITAL_COMMUNITY): Payer: Self-pay | Admitting: Cardiovascular Disease

## 2021-08-14 DIAGNOSIS — Z885 Allergy status to narcotic agent status: Secondary | ICD-10-CM | POA: Insufficient documentation

## 2021-08-14 DIAGNOSIS — Z7984 Long term (current) use of oral hypoglycemic drugs: Secondary | ICD-10-CM | POA: Diagnosis not present

## 2021-08-14 DIAGNOSIS — Z888 Allergy status to other drugs, medicaments and biological substances status: Secondary | ICD-10-CM | POA: Diagnosis not present

## 2021-08-14 DIAGNOSIS — D649 Anemia, unspecified: Secondary | ICD-10-CM | POA: Insufficient documentation

## 2021-08-14 DIAGNOSIS — Z6837 Body mass index (BMI) 37.0-37.9, adult: Secondary | ICD-10-CM | POA: Diagnosis not present

## 2021-08-14 DIAGNOSIS — C50411 Malignant neoplasm of upper-outer quadrant of right female breast: Secondary | ICD-10-CM | POA: Diagnosis not present

## 2021-08-14 DIAGNOSIS — I4891 Unspecified atrial fibrillation: Secondary | ICD-10-CM

## 2021-08-14 DIAGNOSIS — Z7901 Long term (current) use of anticoagulants: Secondary | ICD-10-CM | POA: Diagnosis not present

## 2021-08-14 DIAGNOSIS — I10 Essential (primary) hypertension: Secondary | ICD-10-CM | POA: Insufficient documentation

## 2021-08-14 DIAGNOSIS — R0683 Snoring: Secondary | ICD-10-CM | POA: Diagnosis not present

## 2021-08-14 DIAGNOSIS — Z170 Estrogen receptor positive status [ER+]: Secondary | ICD-10-CM | POA: Diagnosis not present

## 2021-08-14 DIAGNOSIS — D6869 Other thrombophilia: Secondary | ICD-10-CM | POA: Diagnosis not present

## 2021-08-14 DIAGNOSIS — I4819 Other persistent atrial fibrillation: Secondary | ICD-10-CM | POA: Diagnosis not present

## 2021-08-14 DIAGNOSIS — E669 Obesity, unspecified: Secondary | ICD-10-CM | POA: Insufficient documentation

## 2021-08-14 DIAGNOSIS — Z79899 Other long term (current) drug therapy: Secondary | ICD-10-CM | POA: Diagnosis not present

## 2021-08-14 DIAGNOSIS — K219 Gastro-esophageal reflux disease without esophagitis: Secondary | ICD-10-CM | POA: Diagnosis not present

## 2021-08-14 HISTORY — PX: CARDIOVERSION: SHX1299

## 2021-08-14 SURGERY — CARDIOVERSION
Anesthesia: General

## 2021-08-14 MED ORDER — PROPOFOL 10 MG/ML IV BOLUS
INTRAVENOUS | Status: DC | PRN
Start: 2021-08-14 — End: 2021-08-14
  Administered 2021-08-14: 60 mg via INTRAVENOUS

## 2021-08-14 MED ORDER — LIDOCAINE 2% (20 MG/ML) 5 ML SYRINGE
INTRAMUSCULAR | Status: DC | PRN
  Administered 2021-08-14: 60 mg via INTRAVENOUS

## 2021-08-14 MED ORDER — SODIUM CHLORIDE 0.9 % IV SOLN: INTRAVENOUS | Status: DC | PRN

## 2021-08-14 NOTE — CV Procedure (Signed)
   DIRECT CURRENT CARDIOVERSION  NAME:  Kayla Bennett    MRN: [id number] DOB:  03/05/1951    ADMIT DATE: 08/14/2021  Indication:  Symptomatic atrial fibrillation  Procedure Note:  The patient signed informed consent.  They have had had therapeutic anticoagulation with Eliquis greater than 3 weeks.  Anesthesia was administered by Dr. Sherryl Manges.  Adequate airway was maintained throughout and vital followed per protocol.  They were cardioverted x 1 with 200J of biphasic synchronized energy.  They converted to NSR.  There were no apparent complications.  The patient had normal neuro status and respiratory status post procedure with vitals stable as recorded elsewhere.    Follow up: They will continue on current medical therapy and follow up with cardiology as scheduled.  [place] T. Audie Box, MD, [place]  [address], [place], [place] [address] [phone]  10:26 AM

## 2021-08-14 NOTE — Discharge Instructions (Signed)

## 2021-08-14 NOTE — Anesthesia Postprocedure Evaluation (Signed)
Anesthesia Post Note  Patient: Kayla Bennett  Procedure(s) Performed: CARDIOVERSION     Patient location during evaluation: Endoscopy Anesthesia Type: General Level of consciousness: awake and alert Pain management: pain level controlled Vital Signs Assessment: post-procedure vital signs reviewed and stable Respiratory status: spontaneous breathing, nonlabored ventilation, respiratory function stable and patient connected to nasal cannula oxygen Cardiovascular status: blood pressure returned to baseline and stable Postop Assessment: no apparent nausea or vomiting Anesthetic complications: no   No notable events documented.  Last Vitals:  Vitals:   08/14/21 1038 08/14/21 1049  BP: (!) 153/107 (!) 158/87  Pulse: 91 91  Resp: 20 (!) 22  Temp:    SpO2: 98% 96%    Last Pain:  Vitals:   08/14/21 1049  TempSrc:   PainSc: 0-No pain                 Reighlynn Swiney S

## 2021-08-14 NOTE — Anesthesia Procedure Notes (Signed)
Procedure Name: General with mask airway Date/Time: 08/14/2021 10:19 AM Performed by: Dorthea Cove, CRNA Pre-anesthesia Checklist: Patient identified, Emergency Drugs available, Suction available, Patient being monitored and Timeout performed Patient Re-evaluated:Patient Re-evaluated prior to induction Oxygen Delivery Method: Ambu bag Preoxygenation: Pre-oxygenation with 100% oxygen Induction Type: IV induction Ventilation: Mask ventilation without difficulty Placement Confirmation: positive ETCO2 and CO2 detector Dental Injury: Teeth and Oropharynx as per pre-operative assessment

## 2021-08-14 NOTE — Anesthesia Preprocedure Evaluation (Signed)
Anesthesia Evaluation  Patient identified by MRN, date of birth, ID band Patient awake    Reviewed: Allergy & Precautions, H&P , NPO status , Patient's Chart, lab work & pertinent test results  Airway Mallampati: II   Neck ROM: full    Dental   Pulmonary shortness of breath, asthma ,    breath sounds clear to auscultation       Cardiovascular hypertension, + dysrhythmias Atrial Fibrillation  Rhythm:irregular Rate:Normal     Neuro/Psych  Headaches,  Neuromuscular disease    GI/Hepatic GERD  ,  Endo/Other  diabetes, Type 2  Renal/GU      Musculoskeletal  (+) Arthritis , Fibromyalgia -  Abdominal   Peds  Hematology   Anesthesia Other Findings   Reproductive/Obstetrics                             Anesthesia Physical Anesthesia Plan  ASA: 3  Anesthesia Plan: General   Post-op Pain Management:    Induction: Intravenous  PONV Risk Score and Plan: 3 and Treatment may vary due to age or medical condition and Propofol infusion  Airway Management Planned: Mask  Additional Equipment:   Intra-op Plan:   Post-operative Plan:   Informed Consent: I have reviewed the patients History and Physical, chart, labs and discussed the procedure including the risks, benefits and alternatives for the proposed anesthesia with the patient or authorized representative who has indicated his/her understanding and acceptance.     Dental advisory given  Plan Discussed with: CRNA, Anesthesiologist and Surgeon  Anesthesia Plan Comments:         Anesthesia Quick Evaluation

## 2021-08-14 NOTE — Transfer of Care (Signed)
Immediate Anesthesia Transfer of Care Note  Patient: Kayla Bennett  Procedure(s) Performed: CARDIOVERSION  Patient Location: Endoscopy Unit  Anesthesia Type:General  Level of Consciousness: awake and drowsy  Airway & Oxygen Therapy: Patient Spontanous Breathing  Post-op Assessment: Report given to RN and Post -op Vital signs reviewed and stable  Post vital signs: Reviewed and stable  Last Vitals:  Vitals Value Taken Time  BP 148/85   Temp    Pulse 92   Resp 12   SpO2 100     Last Pain:  Vitals:   08/14/21 0851  TempSrc: Temporal  PainSc: 0-No pain         Complications: No notable events documented.

## 2021-08-14 NOTE — Interval H&P Note (Signed)
History and Physical Interval Note:  08/14/2021 8:45 AM  Ladell Pier Bossard  has presented today for surgery, with the diagnosis of AFIB.  The various methods of treatment have been discussed with the patient and family. After consideration of risks, benefits and other options for treatment, the patient has consented to  Procedure(s): CARDIOVERSION (N/A) as a surgical intervention.  The patient's history has been reviewed, patient examined, no change in status, stable for surgery.  I have reviewed the patient's chart and labs.  Questions were answered to the patient's satisfaction.     NPO for DCCV. On eliquis. No missed doses >3 weeks.   Shared Decision Making/Informed Consent The risks (stroke, cardiac arrhythmias rarely resulting in the need for a temporary or permanent pacemaker, skin irritation or burns and complications associated with conscious sedation including aspiration, arrhythmia, respiratory failure and death), benefits (restoration of normal sinus rhythm) and alternatives of a direct current cardioversion were explained in detail to Ms. Dom and she agrees to proceed.   [place] T. Audie Box, MD, [place]  [address] [place], [place] [address] [phone]  8:45 AM

## 2021-08-16 ENCOUNTER — Encounter (HOSPITAL_COMMUNITY): Payer: Self-pay | Admitting: Cardiovascular Disease

## 2021-08-17 LAB — POCT I-STAT, CHEM 8
BUN: 19 mg/dL (ref 8–23)
Calcium, Ion: 1.12 mmol/L — ABNORMAL LOW (ref 1.15–1.40)
Chloride: 102 mmol/L (ref 98–111)
Creatinine, Ser: 0.8 mg/dL (ref 0.44–1.00)
Glucose, Bld: 150 mg/dL — ABNORMAL HIGH (ref 70–99)
HCT: 36 % (ref 36.0–46.0)
Hemoglobin: 12.2 g/dL (ref 12.0–15.0)
Potassium: 4.6 mmol/L (ref 3.5–5.1)
Sodium: 139 mmol/L (ref 135–145)
TCO2: 30 mmol/L (ref 22–32)

## 2021-08-21 ENCOUNTER — Other Ambulatory Visit: Payer: Self-pay

## 2021-08-21 ENCOUNTER — Ambulatory Visit
Admission: RE | Admit: 2021-08-21 | Discharge: 2021-08-21 | Disposition: A | Discharge status: Home / Self Care | Payer: Medicare Other | Source: Ambulatory Visit | Attending: Unknown Physician Specialty | Admitting: Unknown Physician Specialty

## 2021-08-21 DIAGNOSIS — Z780 Asymptomatic menopausal state: Secondary | ICD-10-CM | POA: Diagnosis not present

## 2021-08-21 DIAGNOSIS — Z13820 Encounter for screening for osteoporosis: Secondary | ICD-10-CM

## 2021-08-21 DIAGNOSIS — M8589 Other specified disorders of bone density and structure, multiple sites: Secondary | ICD-10-CM | POA: Diagnosis not present

## 2021-08-22 NOTE — Progress Notes (Signed)
Cardiology Office Note:    Date:  08/23/2021   ID:  Kayla Bennett, DOB July 12, 1951, MRN [id number]  PCP:  [name], MD   [place] Providers Cardiologist:  Freada Bergeron, MD Cardiology APP:  Sharmon Revere    Referring MD: [name], MD   Chief Complaint:  F/u post DCCV    Patient Profile:   Kayla Bennett is a 70 y.o. female with:  Persistent atrial fibrillation  S/p DCCV 03/2021 Flecainide Rx; s/p DCCV 9/22 FHx of CAD, AFib Myoview 2/22: low risk Echocardiogram 2/22: mod MR, normal EF Diabetes mellitus  Hypertension  R Breast CA S/p lumpectomy, LN resection; XRT Asthma  Psoriasis GERD Fibromyalgia  Normocytic anemia Workup ongoing with Oncology Lanora Manis, NP; Dr. Burr Medico) >> cleared to remain on a/c in 9/22   Prior CV studies: GATED SPECT MYO PERF W/LEXISCAN STRESS 1D 12/27/2020 Normal pharmacologic nuclear stress test with no evidence for prior infarct or ischemia. Normal LVEF (EF 74)   Echocardiogram 12/21/2020 EF 50-55, no RWMA, normal RVSF, moderate LAE, moderate MR   History of Present Illness: Ms. Cannady was last seen in 8/22.  She had undergone DCCV in May 2022.  But, she was back in atrial fibrillation when I saw her in August.  She was symptomatic.  I placed her on Flecainide and had her f/u with the AFib Clinic.  She was ultimately set up for DCCV again on 08/14/21.  She returns for f/u.  She is here alone.  Her EKG demonstrates recurrent atrial fibrillation.  She notes continued fatigue and shortness of breath with exertion.  She also has a sensation in her shoulders when she walks.  She has not had chest pain.  She has not had syncope, leg edema.    Past Medical History:  Diagnosis Date   Anemia    Arthritis    Asthma    Diabetes ([place])    Dyspnea    Dysrhythmia    afibb on occasion   Family history of adverse reaction to anesthesia    mother had reaction to versed...during surgery, stopped breathing    Fibromyalgia    GERD (gastroesophageal reflux disease)    Headache    History of hiatal hernia    Hypertension    Osteopenia    Psoriasis    Current Medications: Current Meds  Medication Sig   acetaminophen (TYLENOL) 650 MG CR tablet Take 650-1,300 mg by mouth every 8 (eight) hours as needed for pain.   albuterol (VENTOLIN HFA) 108 (90 Base) MCG/ACT inhaler Inhale 1-2 puffs into the lungs every 6 (six) hours as needed for wheezing or shortness of breath.   Alirocumab (PRALUENT) 150 MG/ML SOAJ Inject 150 mg into the skin every 21 ( twenty-one) days.   apixaban (ELIQUIS) 5 MG TABS tablet Take 1 tablet (5 mg total) by mouth 2 (two) times daily. Pt needs 90 day supply.  Please keep on file till next refill.   cetirizine (ZYRTEC) 10 MG tablet Take 10 mg by mouth in the morning.   cholecalciferol (VITAMIN D3) 25 MCG (1000 UNIT) tablet Take 1,000 Units by mouth in the morning and at bedtime.   ferrous sulfate 325 (65 FE) MG tablet Take 325 mg by mouth daily.   fluorouracil (EFUDEX) 5 % cream Apply 1 application topically 2 (two) times daily as needed (precancerous spots on right cheek). Mixed with calcipotriene 0.005%   gabapentin (NEURONTIN) 100 MG capsule Take 200 mg by mouth at bedtime.  ibuprofen (ADVIL) 200 MG tablet Take 400 mg by mouth every 4 (four) hours as needed for moderate pain or headache.   losartan-hydrochlorothiazide (HYZAAR) 100-12.5 MG tablet Take 1 tablet by mouth daily.   metFORMIN (GLUCOPHAGE-XR) 500 MG 24 hr tablet Take 500 mg by mouth at bedtime.   metoprolol succinate (TOPROL XL) 25 MG 24 hr tablet Take 1 tablet (25 mg total) by mouth daily.   pantoprazole (PROTONIX) 40 MG tablet Take 40 mg by mouth 2 (two) times daily before a meal.   potassium chloride SA (KLOR-CON M20) 20 MEQ tablet Take 1 tablet (20 mEq total) by mouth daily.   Prenatal Vit-Fe Fumarate-FA (PRENATAL VITAMIN PLUS LOW IRON PO) Take 1 tablet by mouth in the morning.   SUMAtriptan (IMITREX) 100 MG tablet  Take 100 mg by mouth every 2 (two) hours as needed for migraine. May repeat in 2 hours if headache persists or recurs.   tamoxifen (NOLVADEX) 20 MG tablet Take 1 tablet (20 mg total) by mouth daily.   triamcinolone (KENALOG) 0.1 % Apply 1 application topically 2 (two) times daily as needed (psoriasis). Mixed with Cetaphil 1:1   venlafaxine XR (EFFEXOR-XR) 37.5 MG 24 hr capsule Take 37.5 mg by mouth at bedtime. Take 1 capsule (75 mg) + 1 capsule (37.5 mg) to equal 112.5 mg at bedtime   venlafaxine XR (EFFEXOR-XR) 75 MG 24 hr capsule Take 75 mg by mouth at bedtime. Take 1 capsule (75 mg) + 1 capsule (37.5 mg) to equal 112.5 mg  at bedtime   vitamin B-12 (CYANOCOBALAMIN) 1000 MCG tablet Take 1,000 mcg by mouth daily.   vitamin C (ASCORBIC ACID) 500 MG tablet Take 500 mg by mouth daily.   [DISCONTINUED] flecainide (TAMBOCOR) 50 MG tablet Take 1 tablet (50 mg total) by mouth 2 (two) times daily.    Allergies:   Lotensin [benazepril hcl], Morphine, Erythromycin base, Hydrocodone, Other, Banana, Chocolate, Gluten meal, Tizanidine, and Wound dressing adhesive   Social History   Tobacco Use   Smoking status: Never   Smokeless tobacco: Never  Vaping Use   Vaping Use: Never used  Substance Use Topics   Alcohol use: Never   Drug use: Never    Family Hx: The patient's family history includes Atrial fibrillation in her cousin and mother; Cancer in her maternal uncle; Colon cancer in her cousin and paternal uncle; Colon cancer (age of onset: 54) in her father; Heart attack (age of onset: 50) in her father; Heart attack (age of onset: 43) in her paternal grandmother; Heart attack (age of onset: 87) in her mother.  Review of Systems  Gastrointestinal:  Negative for melena.  Genitourinary:  Negative for hematuria.    EKGs/Labs/Other Test Reviewed:    EKG:  EKG is  ordered today.  The ekg ordered today demonstrates atrial fibrillation, HR 78, normal axis, nonspecific ST-T wave changes, QTC 449, QRS  74  Recent Labs: 07/29/2021: ALT 22; Platelet Count 286 08/14/2021: BUN 19; Creatinine, Ser 0.80; Hemoglobin 12.2; Potassium 4.6; Sodium 139   Recent Lipid Panel No results found for: CHOL, TRIG, HDL, LDLCALC, LDLDIRECT   Risk Assessment/Calculations:    CHA2DS2-VASc Score = 4   This indicates a 4.8% annual risk of stroke. The patient's score is based upon: CHF History: 0 HTN History: 1 Diabetes History: 1 Stroke History: 0 Vascular Disease History: 0 Age Score: 1 Gender Score: 1    STOP-Bang Score:  5       Physical Exam:    VS:  BP  124/62   Pulse 78   Ht 5\' 2"  (1.575 m)   Wt 203 lb (92.1 kg)   SpO2 94%   BMI 37.13 kg/m     Wt Readings from Last 3 Encounters:  08/23/21 203 lb (92.1 kg)  08/14/21 200 lb (90.7 kg)  07/29/21 207 lb 14.4 oz (94.3 kg)    Constitutional:      Appearance: Healthy appearance. Not in distress.  Neck:     Vascular: JVD normal.  Pulmonary:     Effort: Pulmonary effort is normal.     Breath sounds: No wheezing. No rales.  Cardiovascular:     Normal rate. Regular rhythm. Normal S1. Normal S2.      Murmurs: There is no murmur.  Edema:    Peripheral edema absent.  Abdominal:     Palpations: Abdomen is soft.  Skin:    General: Skin is warm and dry.  Neurological:     Mental Status: Alert and oriented to person, place and time.     Cranial Nerves: Cranial nerves are intact.       ASSESSMENT & PLAN:   1. Persistent atrial fibrillation ([place]) She remains in atrial fibrillation with controlled rate.  I have discussed her case today with Dr. Burt Knack (attending MD).  I will increase her flecainide to 75 mg twice daily.  I will have her follow-up in the atrial fibrillation clinic again in 2 weeks.  If she remains in atrial fibrillation, cardioversion can be arranged again.  If she has recurrent atrial fibrillation despite higher dose flecainide, we will need to consider whether or not to proceed with a different drug like dofetilide or referring  her to EP for possible ablation therapy.  She did have moderate left atrial enlargement in February which may make ablation problematic.  She is tolerating anticoagulation.  Recent hemoglobin was normal.  Continue apixaban 5 mg twice daily, metoprolol succinate 25 mg daily.  I can see her back in about 6 weeks.  If she is maintaining sinus rhythm on flecainide, I will arrange an exercise treadmill test.  2. Essential hypertension Blood pressure is well controlled.  Continue losartan/HCTZ 100/12.5 mg daily, metoprolol succinate 25 mg daily.  3. Snoring Sleep study has been performed and results are pending.  4. Normocytic anemia She has been followed by hematology/oncology.  Recent hemoglobin normal.   Dispo:  Return in about 8 weeks (around 10/18/2021) for Routine follow 6 weeks with Richardson Dopp, PA-C..   Medication Adjustments/Labs and Tests Ordered: Current medicines are reviewed at length with the patient today.  Concerns regarding medicines are outlined above.  Tests Ordered: Orders Placed This Encounter  Procedures   EKG 12-Lead    Medication Changes: Meds ordered this encounter  Medications   flecainide (TAMBOCOR) 50 MG tablet    Sig: Take 1.5 tablets (75 mg total) by mouth 2 (two) times daily.    Dispense:  270 tablet    Refill:  3    Signed, Richardson Dopp, PA-C  08/23/2021 9:55 AM    [facility] [address], [place], [place]  [address] Phone: [phone]; Fax: [phone]

## 2021-08-23 ENCOUNTER — Ambulatory Visit (INDEPENDENT_AMBULATORY_CARE_PROVIDER_SITE_OTHER): Payer: Medicare Other | Admitting: Physician Assistant

## 2021-08-23 ENCOUNTER — Other Ambulatory Visit: Payer: Self-pay

## 2021-08-23 ENCOUNTER — Encounter: Payer: Self-pay | Admitting: Physician Assistant

## 2021-08-23 VITALS — BP 124/62 | HR 78 | Ht 62.0 in | Wt 203.0 lb

## 2021-08-23 DIAGNOSIS — I10 Essential (primary) hypertension: Secondary | ICD-10-CM

## 2021-08-23 DIAGNOSIS — R0683 Snoring: Secondary | ICD-10-CM

## 2021-08-23 DIAGNOSIS — I4819 Other persistent atrial fibrillation: Secondary | ICD-10-CM

## 2021-08-23 DIAGNOSIS — D649 Anemia, unspecified: Secondary | ICD-10-CM | POA: Diagnosis not present

## 2021-08-23 MED ORDER — FLECAINIDE ACETATE 50 MG PO TABS: 75.0000 mg | ORAL_TABLET | Freq: Two times a day (BID) | ORAL | 3 refills | Status: DC

## 2021-08-23 NOTE — Patient Instructions (Addendum)
Medication Instructions:   INCREASE Flecainide one and one half tablet ( 75 mg) twice daily.   *If you need a refill on your cardiac medications before your next appointment, please call your pharmacy*  Lab Work:  -NONE  If you have labs (blood work) drawn today and your tests are completely normal, you will receive your results only by: [place] (if you have MyChart) OR A paper copy in the mail If you have any lab test that is abnormal or we need to change your treatment, we will call you to review the results.  Testing/Procedures:  AFIB CLINIC INFORMATION: Your appointment is scheduled on: Thursday, October 20 at 10:00 am. Please arrive 15 minutes early for check-in. The AFib Clinic is located in the [facility] at [facility]. Parking instructions/directions: [address] (off [address]). When you pull in to Entrance C, there is an underground parking garage to your right. The code to enter the garage is 3333. Take the elevators to the first floor. Follow the signs to the Heart and Vascular Specialty Clinics. You will see registration at the end of the hallway.  Phone number: [phone]   Follow-Up: At [facility], you and your health needs are our priority.  As part of our continuing mission to provide you with exceptional heart care, we have created designated Provider Care Teams.  These Care Teams include your primary Cardiologist (physician) and Advanced Practice Providers (APPs -  Physician Assistants and Nurse Practitioners) who all work together to provide you with the care you need, when you need it.  We recommend signing up for the patient portal called "MyChart".  Sign up information is provided on this After Visit Summary.  MyChart is used to connect with patients for Virtual Visits (Telemedicine).  Patients are able to view lab/test results, encounter notes, upcoming appointments, etc.  Non-urgent messages can be sent to your  provider as well.   To learn more about what you can do with MyChart, go to [email or web address].    Your next appointment:   6 week(s)  The format for your next appointment:   In Person  Provider:   Richardson Dopp, PA-C   Other Instructions

## 2021-08-26 ENCOUNTER — Ambulatory Visit: Payer: Medicare Other

## 2021-08-27 DIAGNOSIS — E559 Vitamin D deficiency, unspecified: Secondary | ICD-10-CM | POA: Diagnosis not present

## 2021-08-27 DIAGNOSIS — E1169 Type 2 diabetes mellitus with other specified complication: Secondary | ICD-10-CM | POA: Diagnosis not present

## 2021-08-27 DIAGNOSIS — M899 Disorder of bone, unspecified: Secondary | ICD-10-CM | POA: Diagnosis not present

## 2021-08-27 DIAGNOSIS — M797 Fibromyalgia: Secondary | ICD-10-CM | POA: Diagnosis not present

## 2021-08-27 DIAGNOSIS — M8580 Other specified disorders of bone density and structure, unspecified site: Secondary | ICD-10-CM | POA: Diagnosis not present

## 2021-08-27 DIAGNOSIS — I10 Essential (primary) hypertension: Secondary | ICD-10-CM | POA: Diagnosis not present

## 2021-08-27 DIAGNOSIS — E7800 Pure hypercholesterolemia, unspecified: Secondary | ICD-10-CM | POA: Diagnosis not present

## 2021-08-27 DIAGNOSIS — I4891 Unspecified atrial fibrillation: Secondary | ICD-10-CM | POA: Diagnosis not present

## 2021-08-27 DIAGNOSIS — Z0000 Encounter for general adult medical examination without abnormal findings: Secondary | ICD-10-CM | POA: Diagnosis not present

## 2021-08-27 DIAGNOSIS — R5383 Other fatigue: Secondary | ICD-10-CM | POA: Diagnosis not present

## 2021-08-27 DIAGNOSIS — Z01419 Encounter for gynecological examination (general) (routine) without abnormal findings: Secondary | ICD-10-CM | POA: Diagnosis not present

## 2021-09-02 ENCOUNTER — Other Ambulatory Visit: Payer: Self-pay

## 2021-09-02 ENCOUNTER — Inpatient Hospital Stay: Payer: Medicare Other | Attending: Radiation Oncology

## 2021-09-02 ENCOUNTER — Other Ambulatory Visit: Payer: Medicare Other

## 2021-09-02 DIAGNOSIS — C50411 Malignant neoplasm of upper-outer quadrant of right female breast: Secondary | ICD-10-CM | POA: Diagnosis not present

## 2021-09-02 DIAGNOSIS — D649 Anemia, unspecified: Secondary | ICD-10-CM

## 2021-09-02 DIAGNOSIS — E538 Deficiency of other specified B group vitamins: Secondary | ICD-10-CM

## 2021-09-02 DIAGNOSIS — Z170 Estrogen receptor positive status [ER+]: Secondary | ICD-10-CM | POA: Insufficient documentation

## 2021-09-02 LAB — CBC WITH DIFFERENTIAL (CANCER CENTER ONLY)
Abs Immature Granulocytes: 0.02 10*3/uL (ref 0.00–0.07)
Basophils Absolute: 0 10*3/uL (ref 0.0–0.1)
Basophils Relative: 1 %
Eosinophils Absolute: 0.1 10*3/uL (ref 0.0–0.5)
Eosinophils Relative: 1 %
HCT: 31.9 % — ABNORMAL LOW (ref 36.0–46.0)
Hemoglobin: 9.7 g/dL — ABNORMAL LOW (ref 12.0–15.0)
Immature Granulocytes: 0 %
Lymphocytes Relative: 20 %
Lymphs Abs: 1.2 10*3/uL (ref 0.7–4.0)
MCH: 26.2 pg (ref 26.0–34.0)
MCHC: 30.4 g/dL (ref 30.0–36.0)
MCV: 86.2 fL (ref 80.0–100.0)
Monocytes Absolute: 0.8 10*3/uL (ref 0.1–1.0)
Monocytes Relative: 12 %
Neutro Abs: 4.1 10*3/uL (ref 1.7–7.7)
Neutrophils Relative %: 66 %
Platelet Count: 182 10*3/uL (ref 150–400)
RBC: 3.7 MIL/uL — ABNORMAL LOW (ref 3.87–5.11)
RDW: 19.9 % — ABNORMAL HIGH (ref 11.5–15.5)
WBC Count: 6.2 10*3/uL (ref 4.0–10.5)
nRBC: 0 % (ref 0.0–0.2)

## 2021-09-02 LAB — CMP (CANCER CENTER ONLY)
ALT: 19 U/L (ref 0–44)
AST: 25 U/L (ref 15–41)
Albumin: 3.3 g/dL — ABNORMAL LOW (ref 3.5–5.0)
Alkaline Phosphatase: 56 U/L (ref 38–126)
Anion gap: 12 (ref 5–15)
BUN: 15 mg/dL (ref 8–23)
CO2: 25 mmol/L (ref 22–32)
Calcium: 9.1 mg/dL (ref 8.9–10.3)
Chloride: 105 mmol/L (ref 98–111)
Creatinine: 0.83 mg/dL (ref 0.44–1.00)
GFR, Estimated: >60 mL/min (ref >60)
Glucose, Bld: 143 mg/dL — ABNORMAL HIGH (ref 70–99)
Potassium: 4 mmol/L (ref 3.5–5.1)
Sodium: 142 mmol/L (ref 135–145)
Total Bilirubin: 0.7 mg/dL (ref 0.3–1.2)
Total Protein: 7.2 g/dL (ref 6.5–8.1)

## 2021-09-02 LAB — IRON AND TIBC
Iron: 65 ug/dL (ref 41–142)
Saturation Ratios: 12 % — ABNORMAL LOW (ref 21–57)
TIBC: 554 ug/dL — ABNORMAL HIGH (ref 236–444)
UIBC: 489 ug/dL — ABNORMAL HIGH (ref 120–384)

## 2021-09-02 LAB — VITAMIN B12: Vitamin B-12: 372 pg/mL (ref 180–914)

## 2021-09-02 LAB — FERRITIN: Ferritin: 64 ng/mL (ref 11–307)

## 2021-09-04 DIAGNOSIS — N611 Abscess of the breast and nipple: Secondary | ICD-10-CM | POA: Diagnosis not present

## 2021-09-05 ENCOUNTER — Encounter (HOSPITAL_COMMUNITY): Payer: Self-pay | Admitting: Physician Assistant

## 2021-09-05 ENCOUNTER — Ambulatory Visit (HOSPITAL_COMMUNITY)
Admission: RE | Admit: 2021-09-05 | Discharge: 2021-09-05 | Disposition: A | Discharge status: Home / Self Care | Payer: Medicare Other | Source: Ambulatory Visit | Attending: Physician Assistant | Admitting: Physician Assistant

## 2021-09-05 ENCOUNTER — Other Ambulatory Visit: Payer: Self-pay

## 2021-09-05 VITALS — BP 144/82 | HR 77 | Ht 62.0 in | Wt 205.8 lb

## 2021-09-05 DIAGNOSIS — D6869 Other thrombophilia: Secondary | ICD-10-CM | POA: Diagnosis not present

## 2021-09-05 DIAGNOSIS — E669 Obesity, unspecified: Secondary | ICD-10-CM | POA: Diagnosis not present

## 2021-09-05 DIAGNOSIS — Z6837 Body mass index (BMI) 37.0-37.9, adult: Secondary | ICD-10-CM | POA: Diagnosis not present

## 2021-09-05 DIAGNOSIS — Z7901 Long term (current) use of anticoagulants: Secondary | ICD-10-CM | POA: Diagnosis not present

## 2021-09-05 DIAGNOSIS — Z7984 Long term (current) use of oral hypoglycemic drugs: Secondary | ICD-10-CM | POA: Diagnosis not present

## 2021-09-05 DIAGNOSIS — Z79899 Other long term (current) drug therapy: Secondary | ICD-10-CM | POA: Insufficient documentation

## 2021-09-05 DIAGNOSIS — I4819 Other persistent atrial fibrillation: Secondary | ICD-10-CM | POA: Diagnosis not present

## 2021-09-05 DIAGNOSIS — R0683 Snoring: Secondary | ICD-10-CM | POA: Diagnosis not present

## 2021-09-05 DIAGNOSIS — E118 Type 2 diabetes mellitus with unspecified complications: Secondary | ICD-10-CM | POA: Insufficient documentation

## 2021-09-05 DIAGNOSIS — I4892 Unspecified atrial flutter: Secondary | ICD-10-CM | POA: Insufficient documentation

## 2021-09-05 DIAGNOSIS — M797 Fibromyalgia: Secondary | ICD-10-CM | POA: Insufficient documentation

## 2021-09-05 DIAGNOSIS — I10 Essential (primary) hypertension: Secondary | ICD-10-CM | POA: Insufficient documentation

## 2021-09-05 NOTE — Progress Notes (Signed)
Primary Care Physician: [name], MD Primary Cardiologist: Dr Johney Frame  Primary Electrophysiologist: none Referring Physician: Richardson Dopp PA   Kayla Bennett is a 70 y.o. female with a history of HTN, DM, breast cancer, fibromyalgia, atrial fibrillation who presents for follow up in the [facility]. The patient was initially diagnosed with atrial fibrillation 03/2021 incidentally on a preoperative ECG. Patient is on Eliquis for a CHADS2VASC score of 4. She underwent DCCV 04/04/21 but was back in afib on follow up 07/10/21. She was started on flecainide and metoprolol. She was seen at the ED 07/25/21 for abdominal/right flank pain. Her Hgb was noted to be down to 8.7 (baseline around 10-11). Per ED note, rectal exam revealed no bleeding.   On follow up today, patient is s/p DCCV on 08/14/21. Unfortunately, she was back in afib on follow up 08/23/21. Her flecainide was increased. She feels fatigued today in rate controlled afib. No bleeding issues on anticoagulation.   Today, she denies symptoms of palpitations, chest pain, shortness of breath, orthopnea, PND, lower extremity edema, dizziness, presyncope, syncope, snoring, daytime somnolence, bleeding, or neurologic sequela. The patient is tolerating medications without difficulties and is otherwise without complaint today.    Atrial Fibrillation Risk Factors:  she does have symptoms or diagnosis of sleep apnea. she does not have a history of rheumatic fever.   she has a BMI of Body mass index is 37.64 kg/m.Marland Kitchen Filed Weights   09/05/21 1001  Weight: 93.4 kg     Family History  Problem Relation Age of Onset   Atrial fibrillation Mother    Heart attack Mother 74   Colon cancer Father 58   Heart attack Father 75   Heart attack Paternal Grandmother 2   Cancer Maternal Uncle        melanoma    Colon cancer Paternal Uncle    Colon cancer Cousin    Atrial fibrillation Cousin      Atrial  Fibrillation Management history:  Previous antiarrhythmic drugs: flecainide  Previous cardioversions: 04/04/21 Previous ablations: none CHADS2VASC score: 4 Anticoagulation history: Eliquis   Past Medical History:  Diagnosis Date   Anemia    Arthritis    Asthma    Diabetes ([place])    Dyspnea    Dysrhythmia    afibb on occasion   Family history of adverse reaction to anesthesia    mother had reaction to versed...during surgery, stopped breathing   Fibromyalgia    GERD (gastroesophageal reflux disease)    Headache    History of hiatal hernia    Hypertension    Osteopenia    Psoriasis    Past Surgical History:  Procedure Laterality Date   ABDOMINAL HYSTERECTOMY     APPENDECTOMY     BREAST LUMPECTOMY WITH RADIOACTIVE SEED AND SENTINEL LYMPH NODE BIOPSY Right 02/18/2021   Procedure: RIGHT BREAST LUMPECTOMY WITH RADIOACTIVE SEED AND SENTINEL LYMPH NODE BIOPSY;  Surgeon: [name], MD;  Location: [facility];  Service: General;  Laterality: Right;   broken wrist     CARDIOVERSION N/A 04/04/2021   Procedure: CARDIOVERSION;  Surgeon: Sueanne Margarita, MD;  Location: [facility];  Service: Cardiovascular;  Laterality: N/A;   CARDIOVERSION N/A 08/14/2021   Procedure: CARDIOVERSION;  Surgeon: Geralynn Rile, MD;  Location: [facility];  Service: Cardiovascular;  Laterality: N/A;   [place]     right wrist   HYSTERECTOMY ABDOMINAL  WITH SALPINGO-OOPHORECTOMY     LYSIS OF ADHESION     SHOULDER SURGERY Left    TUBAL LIGATION      Current Outpatient Medications  Medication Sig Dispense Refill   acetaminophen (TYLENOL) 650 MG CR tablet Take 650-1,300 mg by mouth every 8 (eight) hours as needed for pain.     albuterol (VENTOLIN HFA) 108 (90 Base) MCG/ACT inhaler Inhale 1-2 puffs into the lungs every 6 (six) hours as needed for wheezing or shortness of breath.     Alirocumab (PRALUENT) 150 MG/ML SOAJ Inject 150 mg into the  skin every 21 ( twenty-one) days.     apixaban (ELIQUIS) 5 MG TABS tablet Take 1 tablet (5 mg total) by mouth 2 (two) times daily. Pt needs 90 day supply.  Please keep on file till next refill. 180 tablet 3   cetirizine (ZYRTEC) 10 MG tablet Take 10 mg by mouth in the morning.     cholecalciferol (VITAMIN D3) 25 MCG (1000 UNIT) tablet Take 1,000 Units by mouth in the morning and at bedtime.     ferrous sulfate 325 (65 FE) MG tablet Take 325 mg by mouth daily.     flecainide (TAMBOCOR) 50 MG tablet Take 1.5 tablets (75 mg total) by mouth 2 (two) times daily. 270 tablet 3   fluorouracil (EFUDEX) 5 % cream Apply 1 application topically 2 (two) times daily as needed (precancerous spots on right cheek). Mixed with calcipotriene 0.005%     gabapentin (NEURONTIN) 100 MG capsule Take 200 mg by mouth at bedtime.     ibuprofen (ADVIL) 200 MG tablet Take 400 mg by mouth every 4 (four) hours as needed for moderate pain or headache.     losartan-hydrochlorothiazide (HYZAAR) 100-12.5 MG tablet Take 1 tablet by mouth daily. 30 tablet 6   metFORMIN (GLUCOPHAGE-XR) 500 MG 24 hr tablet Take 500 mg by mouth at bedtime.     metoprolol succinate (TOPROL XL) 25 MG 24 hr tablet Take 1 tablet (25 mg total) by mouth daily.     pantoprazole (PROTONIX) 40 MG tablet Take 40 mg by mouth 2 (two) times daily before a meal.     potassium chloride SA (KLOR-CON M20) 20 MEQ tablet Take 1 tablet (20 mEq total) by mouth daily. 30 tablet 6   Prenatal Vit-Fe Fumarate-FA (PRENATAL VITAMIN PLUS LOW IRON PO) Take 1 tablet by mouth in the morning.     SUMAtriptan (IMITREX) 100 MG tablet Take 100 mg by mouth every 2 (two) hours as needed for migraine. May repeat in 2 hours if headache persists or recurs.     tamoxifen (NOLVADEX) 20 MG tablet Take 1 tablet (20 mg total) by mouth daily. 30 tablet 3   triamcinolone (KENALOG) 0.1 % Apply 1 application topically 2 (two) times daily as needed (psoriasis). Mixed with Cetaphil 1:1     venlafaxine XR  (EFFEXOR-XR) 37.5 MG 24 hr capsule Take 37.5 mg by mouth at bedtime. Take 1 capsule (75 mg) + 1 capsule (37.5 mg) to equal 112.5 mg at bedtime     venlafaxine XR (EFFEXOR-XR) 75 MG 24 hr capsule Take 75 mg by mouth at bedtime. Take 1 capsule (75 mg) + 1 capsule (37.5 mg) to equal 112.5 mg  at bedtime     vitamin B-12 (CYANOCOBALAMIN) 1000 MCG tablet Take 1,000 mcg by mouth daily.     vitamin C (ASCORBIC ACID) 500 MG tablet Take 500 mg by mouth daily.     No current facility-administered medications for this encounter.  Allergies  Allergen Reactions   Lotensin [Benazepril Hcl] Swelling   Morphine Itching and Rash   Erythromycin Base Other (See Comments)    unsure   Hydrocodone Other (See Comments)    Unknown     Other Swelling    Mustard - vaginal swelling/pain    Banana     vaginal swelling/pain   Chocolate     vaginal swelling/pain   Gluten Meal Diarrhea    Upset stomach    Tizanidine     Lowered blood pressure    Wound Dressing Adhesive     Skin burns, blisters     Social History   Socioeconomic History   Marital status: Married    Spouse name: Not on file   Number of children: 2   Years of education: Not on file   Highest education level: Not on file  Occupational History   Not on file  Tobacco Use   Smoking status: Never   Smokeless tobacco: Never  Vaping Use   Vaping Use: Never used  Substance and Sexual Activity   Alcohol use: Never   Drug use: Never   Sexual activity: Not on file  Other Topics Concern   Not on file  Social History Narrative   Not on file   Social Determinants of Health   Financial Resource Strain: Not on file  Food Insecurity: Not on file  Transportation Needs: Not on file  Physical Activity: Not on file  Stress: Not on file  Social Connections: Not on file  Intimate Partner Violence: Not on file     ROS- All systems are reviewed and negative except as per the HPI above.  Physical Exam: Vitals:   09/05/21 1001  BP:  (!) 144/82  Pulse: 77  Weight: 93.4 kg  Height: 5\' 2"  (1.575 m)    GEN- The patient is a well appearing obese female, alert and oriented x 3 today.   HEENT-head normocephalic, atraumatic, sclera clear, conjunctiva pink, hearing intact, trachea midline. Lungs- Clear to ausculation bilaterally, normal work of breathing Heart- irregular rate and rhythm, no murmurs, rubs or gallops  GI- soft, NT, ND, + BS Extremities- no clubbing, cyanosis, or edema MS- no significant deformity or atrophy Skin- no rash or lesion Psych- euthymic mood, full affect Neuro- strength and sensation are intact   Wt Readings from Last 3 Encounters:  09/05/21 93.4 kg  08/23/21 92.1 kg  08/14/21 90.7 kg    EKG today demonstrates  Afib Vent. rate 77 BPM PR interval * ms QRS duration 74 ms QT/QTcB 394/445 ms  Echo 12/21/20 demonstrated   1. Left ventricular ejection fraction, by estimation, is 50 to 55%. The  left ventricle has low normal function. The left ventricle has no regional wall motion abnormalities. Left ventricular diastolic parameters are indeterminate.   2. Right ventricular systolic function is normal. The right ventricular  size is not well visualized.   3. Left atrial size was moderately dilated.   4. The mitral valve is normal in structure. At most moderate mitral valve regurgitation with variability in beat to beat assessment. Pulmonary vein systolic blunting. 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 No evidence of mitral stenosis.   5. The aortic valve is normal in structure. Aortic valve regurgitation is  not visualized. No aortic stenosis is present.   6. The inferior vena cava is normal in size with greater than 50%  respiratory variability, suggesting right atrial pressure of 3 mmHg.   Epic records are reviewed at  length today  CHA2DS2-VASc Score = 4  The patient's score is based upon: CHF History: 0 HTN History: 1 Diabetes History: 1 Stroke History:  0 Vascular Disease History: 0 Age Score: 1 Gender Score: 1       ASSESSMENT AND PLAN: 1. Persistent Atrial Fibrillation (ICD10:  I48.19) The patient's CHA2DS2-VASc score is 4, indicating a 4.8% annual risk of stroke.   Patient in rate controlled symptomatic afib. We discussed rhythm control options today. Will plan for repeat DCCV on higher dose of flecainide. If she has quick return of afib, patient agreeable to referral to EP to discuss ablation.  Continue flecainide 75 mg BID Continue Toprol 25 mg daily Continue Eliquis 5 mg BID  2. Secondary Hypercoagulable State (ICD10:  D68.69) The patient is at significant risk for stroke/thromboembolism based upon her CHA2DS2-VASc Score of 4.  Continue Apixaban (Eliquis).   3. Obesity Body mass index is 37.64 kg/m. Lifestyle modification was discussed and encouraged including regular physical activity and weight reduction.  4. Snoring Sleep study completed, results not in Epic. Will reach out to ordering provider.   5. HTN Stable, no changes today.    Follow up in the AF clinic post DCCV.     [facility] [address] [place], [place] [address] [phone] 09/05/2021 10:10 AM

## 2021-09-05 NOTE — H&P (View-Only) (Signed)
Primary Care Physician: [name], MD Primary Cardiologist: Dr Johney Frame  Primary Electrophysiologist: none Referring Physician: Richardson Dopp PA   Kayla Bennett is a 70 y.o. female with a history of HTN, DM, breast cancer, fibromyalgia, atrial fibrillation who presents for follow up in the [facility]. The patient was initially diagnosed with atrial fibrillation 03/2021 incidentally on a preoperative ECG. Patient is on Eliquis for a CHADS2VASC score of 4. She underwent DCCV 04/04/21 but was back in afib on follow up 07/10/21. She was started on flecainide and metoprolol. She was seen at the ED 07/25/21 for abdominal/right flank pain. Her Hgb was noted to be down to 8.7 (baseline around 10-11). Per ED note, rectal exam revealed no bleeding.   On follow up today, patient is s/p DCCV on 08/14/21. Unfortunately, she was back in afib on follow up 08/23/21. Her flecainide was increased. She feels fatigued today in rate controlled afib. No bleeding issues on anticoagulation.   Today, she denies symptoms of palpitations, chest pain, shortness of breath, orthopnea, PND, lower extremity edema, dizziness, presyncope, syncope, snoring, daytime somnolence, bleeding, or neurologic sequela. The patient is tolerating medications without difficulties and is otherwise without complaint today.    Atrial Fibrillation Risk Factors:  she does have symptoms or diagnosis of sleep apnea. she does not have a history of rheumatic fever.   she has a BMI of Body mass index is 37.64 kg/m.Marland Kitchen Filed Weights   09/05/21 1001  Weight: 93.4 kg     Family History  Problem Relation Age of Onset   Atrial fibrillation Mother    Heart attack Mother 54   Colon cancer Father 46   Heart attack Father 67   Heart attack Paternal Grandmother 87   Cancer Maternal Uncle        melanoma    Colon cancer Paternal Uncle    Colon cancer Cousin    Atrial fibrillation Cousin      Atrial  Fibrillation Management history:  Previous antiarrhythmic drugs: flecainide  Previous cardioversions: 04/04/21 Previous ablations: none CHADS2VASC score: 4 Anticoagulation history: Eliquis   Past Medical History:  Diagnosis Date   Anemia    Arthritis    Asthma    Diabetes ([place])    Dyspnea    Dysrhythmia    afibb on occasion   Family history of adverse reaction to anesthesia    mother had reaction to versed...during surgery, stopped breathing   Fibromyalgia    GERD (gastroesophageal reflux disease)    Headache    History of hiatal hernia    Hypertension    Osteopenia    Psoriasis    Past Surgical History:  Procedure Laterality Date   ABDOMINAL HYSTERECTOMY     APPENDECTOMY     BREAST LUMPECTOMY WITH RADIOACTIVE SEED AND SENTINEL LYMPH NODE BIOPSY Right 02/18/2021   Procedure: RIGHT BREAST LUMPECTOMY WITH RADIOACTIVE SEED AND SENTINEL LYMPH NODE BIOPSY;  Surgeon: [name], MD;  Location: [facility];  Service: General;  Laterality: Right;   broken wrist     CARDIOVERSION N/A 04/04/2021   Procedure: CARDIOVERSION;  Surgeon: Sueanne Margarita, MD;  Location: [facility];  Service: Cardiovascular;  Laterality: N/A;   CARDIOVERSION N/A 08/14/2021   Procedure: CARDIOVERSION;  Surgeon: Geralynn Rile, MD;  Location: [facility];  Service: Cardiovascular;  Laterality: N/A;   [place]     right wrist   HYSTERECTOMY ABDOMINAL  WITH SALPINGO-OOPHORECTOMY     LYSIS OF ADHESION     SHOULDER SURGERY Left    TUBAL LIGATION      Current Outpatient Medications  Medication Sig Dispense Refill   acetaminophen (TYLENOL) 650 MG CR tablet Take 650-1,300 mg by mouth every 8 (eight) hours as needed for pain.     albuterol (VENTOLIN HFA) 108 (90 Base) MCG/ACT inhaler Inhale 1-2 puffs into the lungs every 6 (six) hours as needed for wheezing or shortness of breath.     Alirocumab (PRALUENT) 150 MG/ML SOAJ Inject 150 mg into the  skin every 21 ( twenty-one) days.     apixaban (ELIQUIS) 5 MG TABS tablet Take 1 tablet (5 mg total) by mouth 2 (two) times daily. Pt needs 90 day supply.  Please keep on file till next refill. 180 tablet 3   cetirizine (ZYRTEC) 10 MG tablet Take 10 mg by mouth in the morning.     cholecalciferol (VITAMIN D3) 25 MCG (1000 UNIT) tablet Take 1,000 Units by mouth in the morning and at bedtime.     ferrous sulfate 325 (65 FE) MG tablet Take 325 mg by mouth daily.     flecainide (TAMBOCOR) 50 MG tablet Take 1.5 tablets (75 mg total) by mouth 2 (two) times daily. 270 tablet 3   fluorouracil (EFUDEX) 5 % cream Apply 1 application topically 2 (two) times daily as needed (precancerous spots on right cheek). Mixed with calcipotriene 0.005%     gabapentin (NEURONTIN) 100 MG capsule Take 200 mg by mouth at bedtime.     ibuprofen (ADVIL) 200 MG tablet Take 400 mg by mouth every 4 (four) hours as needed for moderate pain or headache.     losartan-hydrochlorothiazide (HYZAAR) 100-12.5 MG tablet Take 1 tablet by mouth daily. 30 tablet 6   metFORMIN (GLUCOPHAGE-XR) 500 MG 24 hr tablet Take 500 mg by mouth at bedtime.     metoprolol succinate (TOPROL XL) 25 MG 24 hr tablet Take 1 tablet (25 mg total) by mouth daily.     pantoprazole (PROTONIX) 40 MG tablet Take 40 mg by mouth 2 (two) times daily before a meal.     potassium chloride SA (KLOR-CON M20) 20 MEQ tablet Take 1 tablet (20 mEq total) by mouth daily. 30 tablet 6   Prenatal Vit-Fe Fumarate-FA (PRENATAL VITAMIN PLUS LOW IRON PO) Take 1 tablet by mouth in the morning.     SUMAtriptan (IMITREX) 100 MG tablet Take 100 mg by mouth every 2 (two) hours as needed for migraine. May repeat in 2 hours if headache persists or recurs.     tamoxifen (NOLVADEX) 20 MG tablet Take 1 tablet (20 mg total) by mouth daily. 30 tablet 3   triamcinolone (KENALOG) 0.1 % Apply 1 application topically 2 (two) times daily as needed (psoriasis). Mixed with Cetaphil 1:1     venlafaxine XR  (EFFEXOR-XR) 37.5 MG 24 hr capsule Take 37.5 mg by mouth at bedtime. Take 1 capsule (75 mg) + 1 capsule (37.5 mg) to equal 112.5 mg at bedtime     venlafaxine XR (EFFEXOR-XR) 75 MG 24 hr capsule Take 75 mg by mouth at bedtime. Take 1 capsule (75 mg) + 1 capsule (37.5 mg) to equal 112.5 mg  at bedtime     vitamin B-12 (CYANOCOBALAMIN) 1000 MCG tablet Take 1,000 mcg by mouth daily.     vitamin C (ASCORBIC ACID) 500 MG tablet Take 500 mg by mouth daily.     No current facility-administered medications for this encounter.  Allergies  Allergen Reactions   Lotensin [Benazepril Hcl] Swelling   Morphine Itching and Rash   Erythromycin Base Other (See Comments)    unsure   Hydrocodone Other (See Comments)    Unknown     Other Swelling    Mustard - vaginal swelling/pain    Banana     vaginal swelling/pain   Chocolate     vaginal swelling/pain   Gluten Meal Diarrhea    Upset stomach    Tizanidine     Lowered blood pressure    Wound Dressing Adhesive     Skin burns, blisters     Social History   Socioeconomic History   Marital status: Married    Spouse name: Not on file   Number of children: 2   Years of education: Not on file   Highest education level: Not on file  Occupational History   Not on file  Tobacco Use   Smoking status: Never   Smokeless tobacco: Never  Vaping Use   Vaping Use: Never used  Substance and Sexual Activity   Alcohol use: Never   Drug use: Never   Sexual activity: Not on file  Other Topics Concern   Not on file  Social History Narrative   Not on file   Social Determinants of Health   Financial Resource Strain: Not on file  Food Insecurity: Not on file  Transportation Needs: Not on file  Physical Activity: Not on file  Stress: Not on file  Social Connections: Not on file  Intimate Partner Violence: Not on file     ROS- All systems are reviewed and negative except as per the HPI above.  Physical Exam: Vitals:   09/05/21 1001  BP:  (!) 144/82  Pulse: 77  Weight: 93.4 kg  Height: 5\' 2"  (1.575 m)    GEN- The patient is a well appearing obese female, alert and oriented x 3 today.   HEENT-head normocephalic, atraumatic, sclera clear, conjunctiva pink, hearing intact, trachea midline. Lungs- Clear to ausculation bilaterally, normal work of breathing Heart- irregular rate and rhythm, no murmurs, rubs or gallops  GI- soft, NT, ND, + BS Extremities- no clubbing, cyanosis, or edema MS- no significant deformity or atrophy Skin- no rash or lesion Psych- euthymic mood, full affect Neuro- strength and sensation are intact   Wt Readings from Last 3 Encounters:  09/05/21 93.4 kg  08/23/21 92.1 kg  08/14/21 90.7 kg    EKG today demonstrates  Afib Vent. rate 77 BPM PR interval * ms QRS duration 74 ms QT/QTcB 394/445 ms  Echo 12/21/20 demonstrated   1. Left ventricular ejection fraction, by estimation, is 50 to 55%. The  left ventricle has low normal function. The left ventricle has no regional wall motion abnormalities. Left ventricular diastolic parameters are indeterminate.   2. Right ventricular systolic function is normal. The right ventricular  size is not well visualized.   3. Left atrial size was moderately dilated.   4. The mitral valve is normal in structure. At most moderate mitral valve regurgitation with variability in beat to beat assessment. Pulmonary vein systolic blunting. 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 No evidence of mitral stenosis.   5. The aortic valve is normal in structure. Aortic valve regurgitation is  not visualized. No aortic stenosis is present.   6. The inferior vena cava is normal in size with greater than 50%  respiratory variability, suggesting right atrial pressure of 3 mmHg.   Epic records are reviewed at  length today  CHA2DS2-VASc Score = 4  The patient's score is based upon: CHF History: 0 HTN History: 1 Diabetes History: 1 Stroke History:  0 Vascular Disease History: 0 Age Score: 1 Gender Score: 1       ASSESSMENT AND PLAN: 1. Persistent Atrial Fibrillation (ICD10:  I48.19) The patient's CHA2DS2-VASc score is 4, indicating a 4.8% annual risk of stroke.   Patient in rate controlled symptomatic afib. We discussed rhythm control options today. Will plan for repeat DCCV on higher dose of flecainide. If she has quick return of afib, patient agreeable to referral to EP to discuss ablation.  Continue flecainide 75 mg BID Continue Toprol 25 mg daily Continue Eliquis 5 mg BID  2. Secondary Hypercoagulable State (ICD10:  D68.69) The patient is at significant risk for stroke/thromboembolism based upon her CHA2DS2-VASc Score of 4.  Continue Apixaban (Eliquis).   3. Obesity Body mass index is 37.64 kg/m. Lifestyle modification was discussed and encouraged including regular physical activity and weight reduction.  4. Snoring Sleep study completed, results not in Epic. Will reach out to ordering provider.   5. HTN Stable, no changes today.    Follow up in the AF clinic post DCCV.     [facility] [address] [place], [place] [address] [phone] 09/05/2021 10:10 AM

## 2021-09-05 NOTE — Patient Instructions (Signed)
Cardioversion scheduled for Thursday, November 3rd  - Arrive at the [facility] and go to admitting at [facility] not eat or drink anything after midnight the night prior to your procedure.  - Take all your morning medication (except diabetic medications) with a sip of water prior to arrival.  - You will not be able to drive home after your procedure.  - Do NOT miss any doses of your blood thinner - if you should miss a dose please notify our office immediately.  - If you feel as if you go back into normal rhythm prior to scheduled cardioversion, please notify our office immediately. If your procedure is canceled in the cardioversion suite you will be charged a cancellation fee. Patients will be asked to: to mask in public and hand hygiene (no longer quarantine) in the 3 days prior to surgery, to report if any COVID-19-like illness or household contacts to COVID-19 to determine need for testing

## 2021-09-07 ENCOUNTER — Other Ambulatory Visit: Payer: Self-pay | Admitting: Hematology

## 2021-09-09 ENCOUNTER — Inpatient Hospital Stay: Payer: Medicare Other

## 2021-09-09 ENCOUNTER — Other Ambulatory Visit: Payer: Self-pay

## 2021-09-09 DIAGNOSIS — C50411 Malignant neoplasm of upper-outer quadrant of right female breast: Secondary | ICD-10-CM | POA: Diagnosis not present

## 2021-09-09 DIAGNOSIS — Z170 Estrogen receptor positive status [ER+]: Secondary | ICD-10-CM

## 2021-09-09 LAB — CBC WITH DIFFERENTIAL (CANCER CENTER ONLY)
Abs Immature Granulocytes: 0.02 10*3/uL (ref 0.00–0.07)
Basophils Absolute: 0 10*3/uL (ref 0.0–0.1)
Basophils Relative: 1 %
Eosinophils Absolute: 0.1 10*3/uL (ref 0.0–0.5)
Eosinophils Relative: 1 %
HCT: 31 % — ABNORMAL LOW (ref 36.0–46.0)
Hemoglobin: 9.7 g/dL — ABNORMAL LOW (ref 12.0–15.0)
Immature Granulocytes: 0 %
Lymphocytes Relative: 23 %
Lymphs Abs: 1.4 10*3/uL (ref 0.7–4.0)
MCH: 26.9 pg (ref 26.0–34.0)
MCHC: 31.3 g/dL (ref 30.0–36.0)
MCV: 85.9 fL (ref 80.0–100.0)
Monocytes Absolute: 0.8 10*3/uL (ref 0.1–1.0)
Monocytes Relative: 12 %
Neutro Abs: 3.9 10*3/uL (ref 1.7–7.7)
Neutrophils Relative %: 63 %
Platelet Count: 240 10*3/uL (ref 150–400)
RBC: 3.61 MIL/uL — ABNORMAL LOW (ref 3.87–5.11)
RDW: 20.2 % — ABNORMAL HIGH (ref 11.5–15.5)
WBC Count: 6.2 10*3/uL (ref 4.0–10.5)
nRBC: 0 % (ref 0.0–0.2)

## 2021-09-09 LAB — CMP (CANCER CENTER ONLY)
ALT: 22 U/L (ref 0–44)
AST: 33 U/L (ref 15–41)
Albumin: 3.4 g/dL — ABNORMAL LOW (ref 3.5–5.0)
Alkaline Phosphatase: 57 U/L (ref 38–126)
Anion gap: 6 (ref 5–15)
BUN: 16 mg/dL (ref 8–23)
CO2: 28 mmol/L (ref 22–32)
Calcium: 8.7 mg/dL — ABNORMAL LOW (ref 8.9–10.3)
Chloride: 101 mmol/L (ref 98–111)
Creatinine: 0.83 mg/dL (ref 0.44–1.00)
GFR, Estimated: >60 mL/min (ref >60)
Glucose, Bld: 110 mg/dL — ABNORMAL HIGH (ref 70–99)
Potassium: 4 mmol/L (ref 3.5–5.1)
Sodium: 135 mmol/L (ref 135–145)
Total Bilirubin: 0.9 mg/dL (ref 0.3–1.2)
Total Protein: 7 g/dL (ref 6.5–8.1)

## 2021-09-10 ENCOUNTER — Encounter (HOSPITAL_COMMUNITY): Payer: Self-pay | Admitting: Cardiovascular Disease

## 2021-09-11 ENCOUNTER — Telehealth: Payer: Self-pay

## 2021-09-11 NOTE — Telephone Encounter (Signed)
-----   Message from Truitt Merle, MD sent at 09/03/2021  9:27 AM EDT ----- Caryl Asp, Please let pt know her lab results, she has mild to moderate anemia, please check if she is taking oral iron. Her iron level still mildly low, B12 level better, and I would consider iv iron if she has tried oral iron or did not tolerate well, let me or Lacie know. Thanks   Truitt Merle  09/03/2021

## 2021-09-11 NOTE — Telephone Encounter (Signed)
This nurse attempted to reach patient related to lab results and MD recommendations.  Left a message for patient to return call to clinic at her convenience.  No further concerns at this time.

## 2021-09-13 ENCOUNTER — Telehealth: Payer: Self-pay

## 2021-09-13 NOTE — Telephone Encounter (Signed)
Pt returned a telephone call from [facility].  Pt did not indicated what the initially call from [place] stated; therefore, notified Alferd Apa that pt returned her call.

## 2021-09-16 ENCOUNTER — Telehealth: Payer: Self-pay

## 2021-09-16 NOTE — Telephone Encounter (Signed)
-----   Message from Truitt Merle, MD sent at 09/03/2021  9:27 AM EDT ----- Caryl Asp, Please let pt know her lab results, she has mild to moderate anemia, please check if she is taking oral iron. Her iron level still mildly low, B12 level better, and I would consider iv iron if she has tried oral iron or did not tolerate well, let me or Lacie know. Thanks   Truitt Merle  09/03/2021

## 2021-09-16 NOTE — Telephone Encounter (Signed)
This nurse spoke with patient related to her anemia.  Patient states that she takes oral iron and Vit. B-12.  Patient states that she is getting SOB, with minimal exertion and she is very tired.  Patient would like to try IV iron.  This nurse advised that she will inform the provider.  Patient also wants to make the provider know that she is scheduled to receive cardioversion on 09/19/2021.  This nurse advised that the provider will be informed.  No further questions or concerns at this time.

## 2021-09-16 NOTE — Telephone Encounter (Signed)
-----   Message from Alla Feeling, NP sent at 09/09/2021  8:22 AM EDT ----- Please see Dr. Ernestina Penna previous result note to let pt know results and see if she is taking and tolerating oral iron 1-2 times daily. If not able to tolerate, we will arrange additional IV iron. Please let me know.  Thanks, Regan Rakers, NP

## 2021-09-19 ENCOUNTER — Encounter (HOSPITAL_COMMUNITY): Payer: Self-pay | Admitting: Cardiovascular Disease

## 2021-09-19 ENCOUNTER — Other Ambulatory Visit: Payer: Self-pay

## 2021-09-19 ENCOUNTER — Encounter (HOSPITAL_COMMUNITY)
Admission: RE | Disposition: A | Discharge status: Home / Self Care | Payer: Self-pay | Source: Home / Self Care | Attending: Cardiovascular Disease

## 2021-09-19 ENCOUNTER — Ambulatory Visit (HOSPITAL_COMMUNITY)
Admission: RE | Admit: 2021-09-19 | Discharge: 2021-09-19 | Disposition: A | Discharge status: Home / Self Care | Payer: Medicare Other | Attending: Cardiovascular Disease | Admitting: Cardiovascular Disease

## 2021-09-19 ENCOUNTER — Ambulatory Visit (HOSPITAL_COMMUNITY): Payer: Medicare Other | Admitting: Anesthesiology

## 2021-09-19 ENCOUNTER — Telehealth: Payer: Self-pay | Admitting: *Deleted

## 2021-09-19 DIAGNOSIS — Z885 Allergy status to narcotic agent status: Secondary | ICD-10-CM | POA: Insufficient documentation

## 2021-09-19 DIAGNOSIS — E669 Obesity, unspecified: Secondary | ICD-10-CM | POA: Insufficient documentation

## 2021-09-19 DIAGNOSIS — I4819 Other persistent atrial fibrillation: Secondary | ICD-10-CM | POA: Insufficient documentation

## 2021-09-19 DIAGNOSIS — I4891 Unspecified atrial fibrillation: Secondary | ICD-10-CM

## 2021-09-19 DIAGNOSIS — Z7984 Long term (current) use of oral hypoglycemic drugs: Secondary | ICD-10-CM | POA: Diagnosis not present

## 2021-09-19 DIAGNOSIS — Z7901 Long term (current) use of anticoagulants: Secondary | ICD-10-CM | POA: Insufficient documentation

## 2021-09-19 DIAGNOSIS — Z79899 Other long term (current) drug therapy: Secondary | ICD-10-CM | POA: Insufficient documentation

## 2021-09-19 DIAGNOSIS — Z881 Allergy status to other antibiotic agents status: Secondary | ICD-10-CM | POA: Insufficient documentation

## 2021-09-19 DIAGNOSIS — I10 Essential (primary) hypertension: Secondary | ICD-10-CM | POA: Insufficient documentation

## 2021-09-19 DIAGNOSIS — Z6837 Body mass index (BMI) 37.0-37.9, adult: Secondary | ICD-10-CM | POA: Insufficient documentation

## 2021-09-19 DIAGNOSIS — Z853 Personal history of malignant neoplasm of breast: Secondary | ICD-10-CM | POA: Insufficient documentation

## 2021-09-19 DIAGNOSIS — E119 Type 2 diabetes mellitus without complications: Secondary | ICD-10-CM | POA: Insufficient documentation

## 2021-09-19 DIAGNOSIS — R0683 Snoring: Secondary | ICD-10-CM | POA: Insufficient documentation

## 2021-09-19 DIAGNOSIS — M797 Fibromyalgia: Secondary | ICD-10-CM | POA: Insufficient documentation

## 2021-09-19 DIAGNOSIS — D6869 Other thrombophilia: Secondary | ICD-10-CM | POA: Diagnosis not present

## 2021-09-19 DIAGNOSIS — M1990 Unspecified osteoarthritis, unspecified site: Secondary | ICD-10-CM | POA: Diagnosis not present

## 2021-09-19 DIAGNOSIS — Z888 Allergy status to other drugs, medicaments and biological substances status: Secondary | ICD-10-CM | POA: Insufficient documentation

## 2021-09-19 DIAGNOSIS — K219 Gastro-esophageal reflux disease without esophagitis: Secondary | ICD-10-CM | POA: Diagnosis not present

## 2021-09-19 HISTORY — DX: Malignant neoplasm of unspecified site of unspecified female breast: C50.919

## 2021-09-19 HISTORY — PX: CARDIOVERSION: SHX1299

## 2021-09-19 LAB — GLUCOSE, CAPILLARY: Glucose-Capillary: 138 mg/dL — ABNORMAL HIGH (ref 70–99)

## 2021-09-19 SURGERY — CARDIOVERSION
Anesthesia: General

## 2021-09-19 MED ORDER — LIDOCAINE HCL (CARDIAC) PF 100 MG/5ML IV SOSY
PREFILLED_SYRINGE | INTRAVENOUS | Status: DC | PRN
  Administered 2021-09-19: 60 mg via INTRAVENOUS

## 2021-09-19 MED ORDER — PROPOFOL 10 MG/ML IV BOLUS
INTRAVENOUS | Status: DC | PRN
  Administered 2021-09-19: 70 mg via INTRAVENOUS

## 2021-09-19 MED ORDER — SODIUM CHLORIDE 0.9 % IV SOLN: INTRAVENOUS | Status: DC | PRN

## 2021-09-19 NOTE — Discharge Instructions (Signed)

## 2021-09-19 NOTE — Transfer of Care (Signed)
Immediate Anesthesia Transfer of Care Note  Patient: Kayla Bennett  Procedure(s) Performed: CARDIOVERSION  Patient Location: PACU  Anesthesia Type:MAC  Level of Consciousness: drowsy and patient cooperative  Airway & Oxygen Therapy: Patient Spontanous Breathing and Patient connected to nasal cannula oxygen  Post-op Assessment: Report given to RN and Post -op Vital signs reviewed and stable  Post vital signs: Reviewed and stable  Last Vitals:  Vitals Value Taken Time  BP    Temp    Pulse 84 09/19/21 0956  Resp 24 09/19/21 0956  SpO2 98 % 09/19/21 0956  Vitals shown include unvalidated device data.  Last Pain:  Vitals:   09/19/21 0930  TempSrc: Temporal  PainSc: 4          Complications: No notable events documented.

## 2021-09-19 NOTE — Telephone Encounter (Signed)
Hello Nicki Reaper, Julaine Hua handles the [employer] studies however I did find out the patient needs to repeat her study. I sent this to [place] and Claiborne Billings since Arbie Cookey is out of the office today.

## 2021-09-19 NOTE — Anesthesia Preprocedure Evaluation (Signed)
Anesthesia Evaluation  Patient identified by MRN, date of birth, ID band Patient awake    Reviewed: Allergy & Precautions, NPO status , Patient's Chart, lab work & pertinent test results  Airway Mallampati: II  TM Distance: >3 FB Neck ROM: Full    Dental  (+) Teeth Intact   Pulmonary asthma ,    Pulmonary exam normal        Cardiovascular hypertension, + dysrhythmias Atrial Fibrillation  Rhythm:Irregular Rate:Normal     Neuro/Psych  Headaches, negative psych ROS   GI/Hepatic Neg liver ROS, hiatal hernia, GERD  Medicated,  Endo/Other  diabetes  Renal/GU negative Renal ROS  negative genitourinary   Musculoskeletal  (+) Arthritis , Osteoarthritis,  Fibromyalgia -  Abdominal (+)  Abdomen: soft.    Peds  Hematology  (+) anemia ,   Anesthesia Other Findings   Reproductive/Obstetrics                             Anesthesia Physical Anesthesia Plan  ASA: 3  Anesthesia Plan: General   Post-op Pain Management:    Induction: Intravenous  PONV Risk Score and Plan: 3 and Treatment may vary due to age or medical condition and Propofol infusion  Airway Management Planned: Mask  Additional Equipment: None  Intra-op Plan:   Post-operative Plan:   Informed Consent: I have reviewed the patients History and Physical, chart, labs and discussed the procedure including the risks, benefits and alternatives for the proposed anesthesia with the patient or authorized representative who has indicated his/her understanding and acceptance.     Dental advisory given  Plan Discussed with: CRNA  Anesthesia Plan Comments: (ECHO 02/22: 1. Left ventricular ejection fraction, by estimation, is 50 to 55%. The  left ventricle has low normal function. The left ventricle has no regional  wall motion abnormalities. Left ventricular diastolic parameters are  indeterminate.  2. Right ventricular systolic  function is normal. The right ventricular  size is not well visualized.  3. Left atrial size was moderately dilated.  4. The mitral valve is normal in structure. At most moderate mitral valve  regurgitation with variability in beat to beat assessment. Pulmonary vein  systolic blunting. 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 No evidence of mitral  stenosis.  5. The aortic valve is normal in structure. Aortic valve regurgitation is  not visualized. No aortic stenosis is present.  6. The inferior vena cava is normal in size with greater than 50%  respiratory variability, suggesting right atrial pressure of 3 mmHg. )        Anesthesia Quick Evaluation

## 2021-09-19 NOTE — Interval H&P Note (Signed)
History and Physical Interval Note:  09/19/2021 9:24 AM  Ladell Pier Tapanes  has presented today for surgery, with the diagnosis of AFIB.  The various methods of treatment have been discussed with the patient and family. After consideration of risks, benefits and other options for treatment, the patient has consented to  Procedure(s): CARDIOVERSION (N/A) as a surgical intervention.  The patient's history has been reviewed, patient examined, no change in status, stable for surgery.  I have reviewed the patient's chart and labs.  Questions were answered to the patient's satisfaction.     NPO for DCCV. On eliquis. No missed doses >3 weeks.   Lake Bells T. Audie Box, MD, [place]  [address], [place], [place] [address] [phone]  9:24 AM

## 2021-09-19 NOTE — Anesthesia Postprocedure Evaluation (Signed)
Anesthesia Post Note  Patient: Kayla Bennett  Procedure(s) Performed: CARDIOVERSION     Patient location during evaluation: PACU Anesthesia Type: General Level of consciousness: awake and alert Pain management: pain level controlled Vital Signs Assessment: post-procedure vital signs reviewed and stable Respiratory status: spontaneous breathing, nonlabored ventilation, respiratory function stable and patient connected to nasal cannula oxygen Cardiovascular status: blood pressure returned to baseline and stable Postop Assessment: no apparent nausea or vomiting Anesthetic complications: no   No notable events documented.  Last Vitals:  Vitals:   09/19/21 1005 09/19/21 1015  BP: (!) 147/85 (!) 170/87  Pulse: 84 85  Resp: (!) 21 (!) 27  Temp:    SpO2: 100% 95%    Last Pain:  Vitals:   09/19/21 1015  TempSrc:   PainSc: 0-No pain                 [name]

## 2021-09-19 NOTE — Telephone Encounter (Signed)
-----   Message from [name], [place] sent at 09/19/2021  8:55 AM EDT ----- Regarding: RE: Sleep study I still cannot find any results in her chart. [name] - Can you check with Dr. Radford Pax on the results? [name]  ----- Message ----- From: [name], PA Sent: 09/09/2021  11:23 AM EDT To: Freada Bergeron, CMA, [name], PA-C Subject: RE: Sleep study                                I saw that Mychart message but this was now almost 2 months ago. Has it still not been read?  ----- Message ----- From: Freada Bergeron, CMA Sent: 09/05/2021   3:47 PM EDT To: [name], PA-C, [name], PA Subject: RE: Sleep study                                Thank you for letting our office know you completed the study on 07/16/21. The Sleep Study has to be read by the cardiologist. Once the study has been read our office will let you know the results. It may take a few days or may take 1-2 weeks. The cardiologist is reading the reports in between being in clinic and the hospital as well. I assure you though once read we will call you with the results and recommendations if any.   Thank you  Arbie Cookey   ----- Message ----- From: Sharmon Revere Sent: 09/05/2021  11:14 AM EDT To: Freada Bergeron, CMA, [name], PA Subject: RE: Sleep study                                [name] Can you check on her sleep study? Thanks  Event organiser ----- Message ----- From: [name], PA Sent: 09/05/2021  11:09 AM EDT To: [name], PA-C Subject: Sleep study                                    [name],  Looks like you had ordered a home sleep study for Ms Mazzocco back in August of this year. Patient says she completed it later that month but has not heard back about any results. Do you know if it has been read yet? I don't see that the results have been released yet.  Thanks! [employer]

## 2021-09-19 NOTE — CV Procedure (Signed)
   DIRECT CURRENT CARDIOVERSION  NAME:  Kayla Bennett    MRN: [id number] DOB:  10-Feb-1951    ADMIT DATE: 09/19/2021  Indication:  Symptomatic atrial fibrillation  Procedure Note:  The patient signed informed consent.  They have had had therapeutic anticoagulation with eliquis greater than 3 weeks.  Anesthesia was administered by Dr. Rex Kras.  Adequate airway was maintained throughout and vital followed per protocol.  They were cardioverted x 1 with 200J of biphasic synchronized energy.  They converted to NSR.  There were no apparent complications.  The patient had normal neuro status and respiratory status post procedure with vitals stable as recorded elsewhere.    Follow up: They will continue on current medical therapy and follow up with cardiology as scheduled.  Lake Bells T. Audie Box, MD, [place]  [address], [place], [place] [address] [phone]  9:52 AM

## 2021-09-22 ENCOUNTER — Encounter (HOSPITAL_COMMUNITY): Payer: Self-pay | Admitting: Cardiovascular Disease

## 2021-09-24 DIAGNOSIS — Z23 Encounter for immunization: Secondary | ICD-10-CM | POA: Diagnosis not present

## 2021-09-25 ENCOUNTER — Ambulatory Visit: Payer: Medicare Other | Admitting: Physician Assistant

## 2021-09-25 ENCOUNTER — Other Ambulatory Visit: Payer: Self-pay | Admitting: Nurse Practitioner

## 2021-09-25 DIAGNOSIS — D509 Iron deficiency anemia, unspecified: Secondary | ICD-10-CM | POA: Insufficient documentation

## 2021-09-25 NOTE — Telephone Encounter (Signed)
Called and left message for patient to call back.   Spoke to [place], Itamar Rep, and the patient will need to repeat the [facility] Sleep Study due to having an inadequate signal. This could have been due to not having her finger fully inserted into the probe or the probe could have partially come off during the night. The patient may come and pick up another device and the new device can be registered with no additional charge.

## 2021-09-26 ENCOUNTER — Other Ambulatory Visit: Payer: Self-pay | Admitting: Nurse Practitioner

## 2021-09-26 NOTE — Telephone Encounter (Signed)
Left message for pt to call back in regard to Itamar sleep study.See notes from [place], RN, Science writer. Will have pt stop by the office so that I may give her another sleep study box, which I will then register the new device. No charge for the new box. Left message to call back and ask to s/w Arbie Cookey who does the home sleep studies.

## 2021-09-27 ENCOUNTER — Encounter (HOSPITAL_COMMUNITY): Payer: Self-pay | Admitting: Physician Assistant

## 2021-09-27 ENCOUNTER — Other Ambulatory Visit: Payer: Self-pay

## 2021-09-27 ENCOUNTER — Ambulatory Visit (HOSPITAL_COMMUNITY)
Admission: RE | Admit: 2021-09-27 | Discharge: 2021-09-27 | Disposition: A | Discharge status: Home / Self Care | Payer: Medicare Other | Source: Ambulatory Visit | Attending: Physician Assistant | Admitting: Physician Assistant

## 2021-09-27 VITALS — BP 160/98 | HR 69 | Ht 62.0 in | Wt 200.0 lb

## 2021-09-27 DIAGNOSIS — D649 Anemia, unspecified: Secondary | ICD-10-CM | POA: Diagnosis not present

## 2021-09-27 DIAGNOSIS — R0683 Snoring: Secondary | ICD-10-CM | POA: Diagnosis not present

## 2021-09-27 DIAGNOSIS — Z853 Personal history of malignant neoplasm of breast: Secondary | ICD-10-CM | POA: Insufficient documentation

## 2021-09-27 DIAGNOSIS — E669 Obesity, unspecified: Secondary | ICD-10-CM | POA: Insufficient documentation

## 2021-09-27 DIAGNOSIS — Z6836 Body mass index (BMI) 36.0-36.9, adult: Secondary | ICD-10-CM | POA: Diagnosis not present

## 2021-09-27 DIAGNOSIS — Z79899 Other long term (current) drug therapy: Secondary | ICD-10-CM | POA: Insufficient documentation

## 2021-09-27 DIAGNOSIS — M797 Fibromyalgia: Secondary | ICD-10-CM | POA: Insufficient documentation

## 2021-09-27 DIAGNOSIS — I4819 Other persistent atrial fibrillation: Secondary | ICD-10-CM

## 2021-09-27 DIAGNOSIS — E119 Type 2 diabetes mellitus without complications: Secondary | ICD-10-CM | POA: Insufficient documentation

## 2021-09-27 DIAGNOSIS — I10 Essential (primary) hypertension: Secondary | ICD-10-CM | POA: Insufficient documentation

## 2021-09-27 DIAGNOSIS — Z7901 Long term (current) use of anticoagulants: Secondary | ICD-10-CM | POA: Insufficient documentation

## 2021-09-27 DIAGNOSIS — D6869 Other thrombophilia: Secondary | ICD-10-CM

## 2021-09-27 MED ORDER — LOSARTAN POTASSIUM-HCTZ 100-25 MG PO TABS: 1.0000 | ORAL_TABLET | Freq: Every day | ORAL | 3 refills | Status: DC

## 2021-09-27 NOTE — Progress Notes (Addendum)
Primary Care Physician: [name], MD Primary Cardiologist: Dr Johney Frame  Primary Electrophysiologist: none Referring Physician: Richardson Dopp PA   Kayla Bennett is a 70 y.o. female with a history of HTN, DM, breast cancer, fibromyalgia, atrial fibrillation who presents for follow up in the [facility]. The patient was initially diagnosed with atrial fibrillation 03/2021 incidentally on a preoperative ECG. Patient is on Eliquis for a CHADS2VASC score of 4. She underwent DCCV 04/04/21 but was back in afib on follow up 07/10/21. She was started on flecainide and metoprolol. She was seen at the ED 07/25/21 for abdominal/right flank pain. Her Hgb was noted to be down to 8.7 (baseline around 10-11). Per ED note, rectal exam revealed no bleeding. Patient is s/p DCCV on 08/14/21. Unfortunately, she was back in afib on follow up 08/23/21. Her flecainide was increased.   On follow up today, patient is s/p DCCV on 09/19/21. She states she did not feel any different after the DCCV. She is in rate controlled afib today. She is still undergoing treatment for her anemia with hematology. She is going to have an iron infusion soon per patient report. She has noted some higher BP readings at home.   Today, she denies symptoms of palpitations, chest pain, orthopnea, PND, lower extremity edema, dizziness, presyncope, syncope, snoring, daytime somnolence, bleeding, or neurologic sequela. The patient is tolerating medications without difficulties and is otherwise without complaint today.    Atrial Fibrillation Risk Factors:  she does have symptoms or diagnosis of sleep apnea. she does not have a history of rheumatic fever.   she has a BMI of Body mass index is 36.58 kg/m.Marland Kitchen Filed Weights   09/27/21 1011  Weight: 90.7 kg      Family History  Problem Relation Age of Onset   Atrial fibrillation Mother    Heart attack Mother 107   Colon cancer Father 63   Heart attack  Father 39   Heart attack Paternal Grandmother 15   Cancer Maternal Uncle        melanoma    Colon cancer Paternal Uncle    Colon cancer Cousin    Atrial fibrillation Cousin      Atrial Fibrillation Management history:  Previous antiarrhythmic drugs: flecainide  Previous cardioversions: 04/04/21, 08/14/21, 09/19/21 Previous ablations: none CHADS2VASC score: 4 Anticoagulation history: Eliquis   Past Medical History:  Diagnosis Date   Anemia    Arthritis    Asthma    Breast cancer ([place])    Diabetes ([place])    Dyspnea    Dysrhythmia    afibb on occasion   Family history of adverse reaction to anesthesia    mother had reaction to versed...during surgery, stopped breathing   Fibromyalgia    GERD (gastroesophageal reflux disease)    Headache    History of hiatal hernia    Hypertension    Osteopenia    Psoriasis    Past Surgical History:  Procedure Laterality Date   ABDOMINAL HYSTERECTOMY     APPENDECTOMY     BREAST LUMPECTOMY WITH RADIOACTIVE SEED AND SENTINEL LYMPH NODE BIOPSY Right 02/18/2021   Procedure: RIGHT BREAST LUMPECTOMY WITH RADIOACTIVE SEED AND SENTINEL LYMPH NODE BIOPSY;  Surgeon: [name], MD;  Location: [facility];  Service: General;  Laterality: Right;   broken wrist     CARDIOVERSION N/A 04/04/2021   Procedure: CARDIOVERSION;  Surgeon: Sueanne Margarita, MD;  Location: [facility];  Service: Cardiovascular;  Laterality: N/A;   CARDIOVERSION N/A  08/14/2021   Procedure: CARDIOVERSION;  Surgeon: Geralynn Rile, MD;  Location: [facility];  Service: Cardiovascular;  Laterality: N/A;   CARDIOVERSION N/A 09/19/2021   Procedure: CARDIOVERSION;  Surgeon: Geralynn Rile, MD;  Location: [facility];  Service: Cardiovascular;  Laterality: N/A;   [place]     right wrist   HYSTERECTOMY ABDOMINAL WITH SALPINGO-OOPHORECTOMY     LYSIS OF ADHESION     SHOULDER SURGERY Left    TUBAL LIGATION       Current Outpatient Medications  Medication Sig Dispense Refill   acetaminophen (TYLENOL) 500 MG tablet Take 500 mg by mouth every 6 (six) hours as needed for moderate pain, headache or mild pain.     albuterol (VENTOLIN HFA) 108 (90 Base) MCG/ACT inhaler Inhale 1-2 puffs into the lungs every 6 (six) hours as needed (Asthma).     apixaban (ELIQUIS) 5 MG TABS tablet Take 1 tablet (5 mg total) by mouth 2 (two) times daily. Pt needs 90 day supply.  Please keep on file till next refill. 180 tablet 3   cetirizine (ZYRTEC) 10 MG tablet Take 5 mg by mouth in the morning.     cholecalciferol (VITAMIN D3) 25 MCG (1000 UNIT) tablet Take 1,000-2,000 Units by mouth See admin instructions. Take 1000 units in the morning and 2000 units at bedtime     ferrous sulfate 325 (65 FE) MG tablet Take 325 mg by mouth daily.     flecainide (TAMBOCOR) 50 MG tablet Take 1.5 tablets (75 mg total) by mouth 2 (two) times daily. 270 tablet 3   fluorouracil (EFUDEX) 5 % cream Apply 1 application topically 2 (two) times daily as needed (precancerous spots on right cheek). Mixed with calcipotriene 0.005%     gabapentin (NEURONTIN) 100 MG capsule Take 200 mg by mouth at bedtime.     ibuprofen (ADVIL) 200 MG tablet Take 400 mg by mouth every 4 (four) hours as needed for moderate pain or headache.     losartan-hydrochlorothiazide (HYZAAR) 100-12.5 MG tablet Take 1 tablet by mouth daily. 30 tablet 6   metFORMIN (GLUCOPHAGE-XR) 500 MG 24 hr tablet Take 500 mg by mouth daily with supper.     metoprolol succinate (TOPROL XL) 25 MG 24 hr tablet Take 1 tablet (25 mg total) by mouth daily.     pantoprazole (PROTONIX) 40 MG tablet Take 40 mg by mouth 2 (two) times daily before a meal.     potassium chloride SA (KLOR-CON M20) 20 MEQ tablet Take 1 tablet (20 mEq total) by mouth daily. 30 tablet 6   Prenatal Vit-Fe Fumarate-FA (PRENATAL VITAMIN PLUS LOW IRON PO) Take 1 tablet by mouth in the morning.     SUMAtriptan (IMITREX) 100 MG tablet  Take 100 mg by mouth every 2 (two) hours as needed for migraine. May repeat in 2 hours if headache persists or recurs.     tamoxifen (NOLVADEX) 20 MG tablet TAKE 1 TABLET(20 MG) BY MOUTH DAILY 30 tablet 3   triamcinolone (KENALOG) 0.1 % Apply 1 application topically 2 (two) times daily as needed (psoriasis). Mixed with Cetaphil 1:1     venlafaxine XR (EFFEXOR-XR) 37.5 MG 24 hr capsule Take 37.5 mg by mouth at bedtime. Take 1 capsule (75 mg) + 1 capsule (37.5 mg) to equal 112.5 mg at bedtime     venlafaxine XR (EFFEXOR-XR) 75 MG 24 hr capsule Take 75 mg by mouth at bedtime. Take 1  capsule (75 mg) + 1 capsule (37.5 mg) to equal 112.5 mg  at bedtime     vitamin B-12 (CYANOCOBALAMIN) 1000 MCG tablet Take 1,000 mcg by mouth every morning.     vitamin C (ASCORBIC ACID) 500 MG tablet Take 500 mg by mouth daily.     No current facility-administered medications for this encounter.    Allergies  Allergen Reactions   Lotensin [Benazepril Hcl] Swelling   Morphine Itching and Rash   Erythromycin Base Other (See Comments)    unsure   Hydrocodone Other (See Comments)    Unknown     Other Swelling    Mustard - vaginal swelling/pain    Banana     vaginal swelling/pain   Chocolate     vaginal swelling/pain   Gluten Meal Diarrhea    Upset stomach    Tizanidine     Lowered blood pressure    Wound Dressing Adhesive     Skin burns, blisters     Social History   Socioeconomic History   Marital status: Married    Spouse name: Not on file   Number of children: 2   Years of education: Not on file   Highest education level: Not on file  Occupational History   Not on file  Tobacco Use   Smoking status: Never   Smokeless tobacco: Never  Vaping Use   Vaping Use: Never used  Substance and Sexual Activity   Alcohol use: Never   Drug use: Never   Sexual activity: Not on file  Other Topics Concern   Not on file  Social History Narrative   Not on file   Social Determinants of Health    Financial Resource Strain: Not on file  Food Insecurity: Not on file  Transportation Needs: Not on file  Physical Activity: Not on file  Stress: Not on file  Social Connections: Not on file  Intimate Partner Violence: Not on file     ROS- All systems are reviewed and negative except as per the HPI above.  Physical Exam: Vitals:   09/27/21 1011  BP: (!) 160/98  Pulse: 69  Weight: 90.7 kg  Height: 5\' 2"  (1.575 m)    GEN- The patient is a well appearing obese female, alert and oriented x 3 today.   HEENT-head normocephalic, atraumatic, sclera clear, conjunctiva pink, hearing intact, trachea midline. Lungs- Clear to ausculation bilaterally, normal work of breathing Heart- irregular rate and rhythm, no murmurs, rubs or gallops  GI- soft, NT, ND, + BS Extremities- no clubbing, cyanosis, or edema MS- no significant deformity or atrophy Skin- no rash or lesion Psych- euthymic mood, full affect Neuro- strength and sensation are intact   Wt Readings from Last 3 Encounters:  09/27/21 90.7 kg  09/19/21 90.7 kg  09/05/21 93.4 kg    EKG today demonstrates  Afib Vent. rate 69 BPM PR interval * ms QRS duration 80 ms QT/QTcB 418/447 ms  Echo 12/21/20 demonstrated   1. Left ventricular ejection fraction, by estimation, is 50 to 55%. The  left ventricle has low normal function. The left ventricle has no regional wall motion abnormalities. Left ventricular diastolic parameters are indeterminate.   2. Right ventricular systolic function is normal. The right ventricular  size is not well visualized.   3. Left atrial size was moderately dilated.   4. The mitral valve is normal in structure. At most moderate mitral valve regurgitation with variability in beat to beat assessment. Pulmonary vein systolic blunting. Mechanism appears to be  atrial functional mitral regurgitation. No evidence of mitral stenosis.   5. The aortic valve is normal in structure. Aortic valve regurgitation is   not visualized. No aortic stenosis is present.   6. The inferior vena cava is normal in size with greater than 50%  respiratory variability, suggesting right atrial pressure of 3 mmHg.   Epic records are reviewed at length today  CHA2DS2-VASc Score = 4  The patient's score is based upon: CHF History: 0 HTN History: 1 Diabetes History: 1 Stroke History: 0 Vascular Disease History: 0 Age Score: 1 Gender Score: 1       ASSESSMENT AND PLAN: 1. Persistent Atrial Fibrillation (ICD10:  I48.19) The patient's CHA2DS2-VASc score is 4, indicating a 4.8% annual risk of stroke.   Patient back in rate controlled afib. ? Driven by anemia vs undiagnosed OSA. We discussed rhythm control options including AAD vs ablation. Would not increase flecainide given her prolonged PR in SR and not sure she would be a good candidate for dofetilide with QT ~480 ms in SR. Could consider amiodarone. Will refer her to EP to discuss options.  Will stop flecainide today. Continue Toprol 25 mg daily Continue Eliquis 5 mg BID Check TSH  2. Secondary Hypercoagulable State (ICD10:  D68.69) The patient is at significant risk for stroke/thromboembolism based upon her CHA2DS2-VASc Score of 4.  Continue Apixaban (Eliquis).   3. Obesity Body mass index is 36.58 kg/m. Lifestyle modification was discussed and encouraged including regular physical activity and weight reduction.  4. Snoring Sleep study completed but there was an issue with the equipment.  Scheduled for repeat study.   5. HTN Elevated today and at home. Increase Hyzaar back to 100-25 mg daily.  6. Anemia ? If this is also contributing to her symptoms as she did not feel improved in SR. Plans per Dr Burr Medico.   Follow up with EP to discuss rhythm options.     [facility] [address] [place], [place] [address] [phone] 09/27/2021 10:27 AM

## 2021-09-27 NOTE — Patient Instructions (Signed)
Stop flecainide  Increase Hyzaar to 100/25 once a day

## 2021-10-01 ENCOUNTER — Other Ambulatory Visit: Payer: Self-pay | Admitting: Nurse Practitioner

## 2021-10-01 ENCOUNTER — Encounter: Payer: Self-pay | Admitting: Nurse Practitioner

## 2021-10-01 DIAGNOSIS — R6889 Other general symptoms and signs: Secondary | ICD-10-CM

## 2021-10-03 NOTE — Telephone Encounter (Signed)
Our office has left multiple messages to have her come by the office so that we may give he another Itamar sleep study. Left message when she calls back to ask to s/w Arbie Cookey who does the home sleep studies. Once I do give the pt a new study, I will register that one into [facility]. I will update ordering provider as we have been trying to reach the pt, though unsuccessful in our attempts.

## 2021-10-04 ENCOUNTER — Ambulatory Visit: Payer: Medicare Other | Admitting: Physician Assistant

## 2021-10-07 ENCOUNTER — Other Ambulatory Visit: Payer: Self-pay | Admitting: Nurse Practitioner

## 2021-10-08 ENCOUNTER — Other Ambulatory Visit: Payer: Self-pay | Admitting: Pharmacy Technician

## 2021-10-08 NOTE — Telephone Encounter (Signed)
Left message for the pt to call back in regard to her Itamar Sleep study. See previous notes. We would like to offer the pt another Itamar Sleep study, at not extra cost. Left message pt can call back and ask to s/w Arbie Cookey who does the home sleep studies. I did leave on my message though that I will not be in the office tomorrow 10/09/21, however, she can ask to s/w Janan Halter, RN, Clinic Supervisor or Drue Novel, RN, Science writer.

## 2021-10-09 NOTE — Telephone Encounter (Addendum)
Left message for patient to call back before 5pm or to call Monday.    Patient called and will come on Tues around 11am to get another Itamar.  She is aware to ask for [facility].

## 2021-10-09 NOTE — Telephone Encounter (Signed)
Pt is returning call.  

## 2021-10-12 ENCOUNTER — Encounter (HOSPITAL_COMMUNITY): Payer: Self-pay | Admitting: Emergency Medicine

## 2021-10-12 ENCOUNTER — Emergency Department (HOSPITAL_COMMUNITY): Payer: Medicare Other

## 2021-10-12 ENCOUNTER — Inpatient Hospital Stay (HOSPITAL_COMMUNITY)
Admission: EM | Admit: 2021-10-12 | Discharge: 2021-10-17 | DRG: 872 | Disposition: A | Discharge status: Home / Self Care | Payer: Medicare Other | Attending: Internal Medicine | Admitting: Internal Medicine

## 2021-10-12 ENCOUNTER — Other Ambulatory Visit: Payer: Self-pay

## 2021-10-12 DIAGNOSIS — L7634 Postprocedural seroma of skin and subcutaneous tissue following other procedure: Secondary | ICD-10-CM | POA: Diagnosis not present

## 2021-10-12 DIAGNOSIS — Z7901 Long term (current) use of anticoagulants: Secondary | ICD-10-CM

## 2021-10-12 DIAGNOSIS — K449 Diaphragmatic hernia without obstruction or gangrene: Secondary | ICD-10-CM | POA: Diagnosis not present

## 2021-10-12 DIAGNOSIS — F32A Depression, unspecified: Secondary | ICD-10-CM | POA: Diagnosis not present

## 2021-10-12 DIAGNOSIS — Z800 Family history of malignant neoplasm of digestive organs: Secondary | ICD-10-CM | POA: Diagnosis not present

## 2021-10-12 DIAGNOSIS — J45909 Unspecified asthma, uncomplicated: Secondary | ICD-10-CM | POA: Diagnosis not present

## 2021-10-12 DIAGNOSIS — R519 Headache, unspecified: Secondary | ICD-10-CM | POA: Diagnosis present

## 2021-10-12 DIAGNOSIS — Z8249 Family history of ischemic heart disease and other diseases of the circulatory system: Secondary | ICD-10-CM | POA: Diagnosis not present

## 2021-10-12 DIAGNOSIS — D638 Anemia in other chronic diseases classified elsewhere: Secondary | ICD-10-CM | POA: Diagnosis not present

## 2021-10-12 DIAGNOSIS — Z483 Aftercare following surgery for neoplasm: Secondary | ICD-10-CM | POA: Diagnosis not present

## 2021-10-12 DIAGNOSIS — N6001 Solitary cyst of right breast: Secondary | ICD-10-CM | POA: Diagnosis not present

## 2021-10-12 DIAGNOSIS — R509 Fever, unspecified: Secondary | ICD-10-CM | POA: Diagnosis present

## 2021-10-12 DIAGNOSIS — R6520 Severe sepsis without septic shock: Secondary | ICD-10-CM | POA: Diagnosis not present

## 2021-10-12 DIAGNOSIS — R110 Nausea: Secondary | ICD-10-CM | POA: Diagnosis present

## 2021-10-12 DIAGNOSIS — N611 Abscess of the breast and nipple: Secondary | ICD-10-CM | POA: Diagnosis not present

## 2021-10-12 DIAGNOSIS — D519 Vitamin B12 deficiency anemia, unspecified: Secondary | ICD-10-CM | POA: Diagnosis not present

## 2021-10-12 DIAGNOSIS — Z79810 Long term (current) use of selective estrogen receptor modulators (SERMs): Secondary | ICD-10-CM | POA: Diagnosis not present

## 2021-10-12 DIAGNOSIS — M6281 Muscle weakness (generalized): Secondary | ICD-10-CM | POA: Diagnosis not present

## 2021-10-12 DIAGNOSIS — Z170 Estrogen receptor positive status [ER+]: Secondary | ICD-10-CM | POA: Diagnosis not present

## 2021-10-12 DIAGNOSIS — I4819 Other persistent atrial fibrillation: Secondary | ICD-10-CM | POA: Diagnosis present

## 2021-10-12 DIAGNOSIS — R600 Localized edema: Secondary | ICD-10-CM | POA: Diagnosis not present

## 2021-10-12 DIAGNOSIS — M436 Torticollis: Secondary | ICD-10-CM | POA: Diagnosis present

## 2021-10-12 DIAGNOSIS — Z923 Personal history of irradiation: Secondary | ICD-10-CM | POA: Diagnosis not present

## 2021-10-12 DIAGNOSIS — A419 Sepsis, unspecified organism: Principal | ICD-10-CM | POA: Diagnosis present

## 2021-10-12 DIAGNOSIS — Z90710 Acquired absence of both cervix and uterus: Secondary | ICD-10-CM

## 2021-10-12 DIAGNOSIS — Z7984 Long term (current) use of oral hypoglycemic drugs: Secondary | ICD-10-CM | POA: Diagnosis not present

## 2021-10-12 DIAGNOSIS — L02411 Cutaneous abscess of right axilla: Secondary | ICD-10-CM | POA: Diagnosis not present

## 2021-10-12 DIAGNOSIS — R0602 Shortness of breath: Secondary | ICD-10-CM | POA: Diagnosis not present

## 2021-10-12 DIAGNOSIS — K219 Gastro-esophageal reflux disease without esophagitis: Secondary | ICD-10-CM | POA: Diagnosis not present

## 2021-10-12 DIAGNOSIS — G9340 Encephalopathy, unspecified: Secondary | ICD-10-CM | POA: Diagnosis not present

## 2021-10-12 DIAGNOSIS — M797 Fibromyalgia: Secondary | ICD-10-CM | POA: Diagnosis present

## 2021-10-12 DIAGNOSIS — D509 Iron deficiency anemia, unspecified: Secondary | ICD-10-CM | POA: Diagnosis present

## 2021-10-12 DIAGNOSIS — M25611 Stiffness of right shoulder, not elsewhere classified: Secondary | ICD-10-CM | POA: Diagnosis not present

## 2021-10-12 DIAGNOSIS — Z20822 Contact with and (suspected) exposure to covid-19: Secondary | ICD-10-CM | POA: Diagnosis not present

## 2021-10-12 DIAGNOSIS — D508 Other iron deficiency anemias: Secondary | ICD-10-CM

## 2021-10-12 DIAGNOSIS — Z79899 Other long term (current) drug therapy: Secondary | ICD-10-CM | POA: Diagnosis not present

## 2021-10-12 DIAGNOSIS — L089 Local infection of the skin and subcutaneous tissue, unspecified: Secondary | ICD-10-CM | POA: Diagnosis not present

## 2021-10-12 DIAGNOSIS — E1142 Type 2 diabetes mellitus with diabetic polyneuropathy: Secondary | ICD-10-CM | POA: Diagnosis present

## 2021-10-12 DIAGNOSIS — Z853 Personal history of malignant neoplasm of breast: Secondary | ICD-10-CM | POA: Diagnosis not present

## 2021-10-12 DIAGNOSIS — L729 Follicular cyst of the skin and subcutaneous tissue, unspecified: Secondary | ICD-10-CM | POA: Diagnosis not present

## 2021-10-12 DIAGNOSIS — R531 Weakness: Secondary | ICD-10-CM | POA: Diagnosis not present

## 2021-10-12 DIAGNOSIS — E669 Obesity, unspecified: Secondary | ICD-10-CM

## 2021-10-12 DIAGNOSIS — R262 Difficulty in walking, not elsewhere classified: Secondary | ICD-10-CM | POA: Diagnosis not present

## 2021-10-12 DIAGNOSIS — R293 Abnormal posture: Secondary | ICD-10-CM | POA: Diagnosis not present

## 2021-10-12 DIAGNOSIS — E119 Type 2 diabetes mellitus without complications: Secondary | ICD-10-CM

## 2021-10-12 DIAGNOSIS — I10 Essential (primary) hypertension: Secondary | ICD-10-CM | POA: Diagnosis present

## 2021-10-12 DIAGNOSIS — R079 Chest pain, unspecified: Secondary | ICD-10-CM | POA: Diagnosis not present

## 2021-10-12 DIAGNOSIS — R42 Dizziness and giddiness: Secondary | ICD-10-CM | POA: Diagnosis not present

## 2021-10-12 DIAGNOSIS — C50411 Malignant neoplasm of upper-outer quadrant of right female breast: Secondary | ICD-10-CM | POA: Diagnosis not present

## 2021-10-12 DIAGNOSIS — F419 Anxiety disorder, unspecified: Secondary | ICD-10-CM | POA: Diagnosis not present

## 2021-10-12 DIAGNOSIS — Z9049 Acquired absence of other specified parts of digestive tract: Secondary | ICD-10-CM

## 2021-10-12 DIAGNOSIS — C50911 Malignant neoplasm of unspecified site of right female breast: Secondary | ICD-10-CM | POA: Diagnosis not present

## 2021-10-12 DIAGNOSIS — E1169 Type 2 diabetes mellitus with other specified complication: Secondary | ICD-10-CM

## 2021-10-12 DIAGNOSIS — I480 Paroxysmal atrial fibrillation: Secondary | ICD-10-CM | POA: Diagnosis not present

## 2021-10-12 HISTORY — DX: Migraine, unspecified, not intractable, without status migrainosus: G43.909

## 2021-10-12 LAB — BASIC METABOLIC PANEL WITH GFR
Anion gap: 10 (ref 5–15)
BUN: 12 mg/dL (ref 8–23)
CO2: 24 mmol/L (ref 22–32)
Calcium: 8.8 mg/dL — ABNORMAL LOW (ref 8.9–10.3)
Chloride: 101 mmol/L (ref 98–111)
Creatinine, Ser: 0.82 mg/dL (ref 0.44–1.00)
GFR, Estimated: >60 mL/min
Glucose, Bld: 153 mg/dL — ABNORMAL HIGH (ref 70–99)
Potassium: 3.9 mmol/L (ref 3.5–5.1)
Sodium: 135 mmol/L (ref 135–145)

## 2021-10-12 LAB — LACTIC ACID, PLASMA: Lactic Acid, Venous: 1.3 mmol/L (ref 0.5–1.9)

## 2021-10-12 LAB — CBC WITH DIFFERENTIAL/PLATELET
Abs Immature Granulocytes: 0.04 10*3/uL (ref 0.00–0.07)
Basophils Absolute: 0 10*3/uL (ref 0.0–0.1)
Basophils Relative: 0 %
Eosinophils Absolute: 0 10*3/uL (ref 0.0–0.5)
Eosinophils Relative: 0 %
HCT: 36.1 % (ref 36.0–46.0)
Hemoglobin: 11.3 g/dL — ABNORMAL LOW (ref 12.0–15.0)
Immature Granulocytes: 0 %
Lymphocytes Relative: 18 %
Lymphs Abs: 2 10*3/uL (ref 0.7–4.0)
MCH: 28 pg (ref 26.0–34.0)
MCHC: 31.3 g/dL (ref 30.0–36.0)
MCV: 89.6 fL (ref 80.0–100.0)
Monocytes Absolute: 1.1 10*3/uL — ABNORMAL HIGH (ref 0.1–1.0)
Monocytes Relative: 9 %
Neutro Abs: 8.5 10*3/uL — ABNORMAL HIGH (ref 1.7–7.7)
Neutrophils Relative %: 73 %
Platelets: 173 10*3/uL (ref 150–400)
RBC: 4.03 MIL/uL (ref 3.87–5.11)
RDW: 17.8 % — ABNORMAL HIGH (ref 11.5–15.5)
WBC: 11.7 10*3/uL — ABNORMAL HIGH (ref 4.0–10.5)
nRBC: 0 % (ref 0.0–0.2)

## 2021-10-12 LAB — BRAIN NATRIURETIC PEPTIDE: B Natriuretic Peptide: 277.5 pg/mL — ABNORMAL HIGH (ref 0.0–100.0)

## 2021-10-12 LAB — URINALYSIS, ROUTINE W REFLEX MICROSCOPIC
Bilirubin Urine: NEGATIVE
Glucose, UA: NEGATIVE mg/dL
Hgb urine dipstick: NEGATIVE
Ketones, ur: NEGATIVE mg/dL
Leukocytes,Ua: NEGATIVE
Nitrite: NEGATIVE
Protein, ur: NEGATIVE mg/dL
Specific Gravity, Urine: 1.008 (ref 1.005–1.030)
pH: 6 (ref 5.0–8.0)

## 2021-10-12 LAB — RESP PANEL BY RT-PCR (FLU A&B, COVID) ARPGX2
Influenza A by PCR: NEGATIVE
Influenza B by PCR: NEGATIVE
SARS Coronavirus 2 by RT PCR: NEGATIVE

## 2021-10-12 LAB — CBG MONITORING, ED: Glucose-Capillary: 142 mg/dL — ABNORMAL HIGH (ref 70–99)

## 2021-10-12 LAB — TROPONIN I (HIGH SENSITIVITY): Troponin I (High Sensitivity): 12 ng/L (ref <18)

## 2021-10-12 MED ORDER — APIXABAN 5 MG PO TABS
5.0000 mg | ORAL_TABLET | Freq: Two times a day (BID) | ORAL | Status: DC
  Administered 2021-10-12 – 2021-10-17 (×10): 5 mg via ORAL
  Filled 2021-10-12 (×10): qty 1

## 2021-10-12 MED ORDER — METOCLOPRAMIDE HCL 5 MG/ML IJ SOLN
10.0000 mg | Freq: Once | INTRAMUSCULAR | Status: AC
  Administered 2021-10-12: 10 mg via INTRAVENOUS
  Filled 2021-10-12: qty 2

## 2021-10-12 MED ORDER — VANCOMYCIN HCL 1000 MG/200ML IV SOLN: 1000.0000 mg | INTRAVENOUS | Status: DC

## 2021-10-12 MED ORDER — INSULIN ASPART 100 UNIT/ML IJ SOLN
0.0000 [IU] | Freq: Three times a day (TID) | INTRAMUSCULAR | Status: DC
Start: 2021-10-13 — End: 2021-10-17

## 2021-10-12 MED ORDER — ALBUTEROL SULFATE (2.5 MG/3ML) 0.083% IN NEBU: 3.0000 mL | INHALATION_SOLUTION | Freq: Four times a day (QID) | RESPIRATORY_TRACT | Status: DC | PRN

## 2021-10-12 MED ORDER — VANCOMYCIN HCL 1750 MG/350ML IV SOLN
1750.0000 mg | Freq: Once | INTRAVENOUS | Status: AC
  Administered 2021-10-12: 1750 mg via INTRAVENOUS
  Filled 2021-10-12: qty 350

## 2021-10-12 MED ORDER — SODIUM CHLORIDE 0.9 % IV SOLN
2.0000 g | Freq: Once | INTRAVENOUS | Status: AC
  Administered 2021-10-12: 2 g via INTRAVENOUS
  Filled 2021-10-12: qty 20

## 2021-10-12 MED ORDER — KETOROLAC TROMETHAMINE 15 MG/ML IJ SOLN
15.0000 mg | Freq: Four times a day (QID) | INTRAMUSCULAR | Status: AC | PRN
  Administered 2021-10-15: 15 mg via INTRAVENOUS
  Filled 2021-10-12 (×2): qty 1

## 2021-10-12 MED ORDER — PANTOPRAZOLE SODIUM 40 MG PO TBEC
40.0000 mg | DELAYED_RELEASE_TABLET | Freq: Two times a day (BID) | ORAL | Status: DC
  Administered 2021-10-13 – 2021-10-17 (×9): 40 mg via ORAL
  Filled 2021-10-12 (×9): qty 1

## 2021-10-12 MED ORDER — SODIUM CHLORIDE 0.9 % IV BOLUS
500.0000 mL | Freq: Once | INTRAVENOUS | Status: AC
  Administered 2021-10-12: 500 mL via INTRAVENOUS

## 2021-10-12 MED ORDER — FLUCONAZOLE 200 MG PO TABS
200.0000 mg | ORAL_TABLET | Freq: Once | ORAL | Status: AC
  Administered 2021-10-12: 200 mg via ORAL
  Filled 2021-10-12: qty 1

## 2021-10-12 MED ORDER — VENLAFAXINE HCL ER 75 MG PO CP24
75.0000 mg | ORAL_CAPSULE | Freq: Every day | ORAL | Status: DC
  Administered 2021-10-12 – 2021-10-14 (×3): 75 mg via ORAL
  Filled 2021-10-12 (×5): qty 1

## 2021-10-12 MED ORDER — ACETAMINOPHEN 325 MG PO TABS
650.0000 mg | ORAL_TABLET | Freq: Four times a day (QID) | ORAL | Status: DC | PRN
  Administered 2021-10-13 – 2021-10-15 (×6): 650 mg via ORAL
  Filled 2021-10-12 (×5): qty 2

## 2021-10-12 MED ORDER — ACETAMINOPHEN 500 MG PO TABS
1000.0000 mg | ORAL_TABLET | Freq: Once | ORAL | Status: AC
  Administered 2021-10-12: 1000 mg via ORAL
  Filled 2021-10-12: qty 2

## 2021-10-12 MED ORDER — TAMOXIFEN CITRATE 10 MG PO TABS
20.0000 mg | ORAL_TABLET | Freq: Every day | ORAL | Status: DC
  Administered 2021-10-13 – 2021-10-17 (×5): 20 mg via ORAL
  Filled 2021-10-12 (×5): qty 2

## 2021-10-12 MED ORDER — SODIUM CHLORIDE 0.9 % IV SOLN: INTRAVENOUS | Status: AC

## 2021-10-12 MED ORDER — ONDANSETRON HCL 4 MG/2ML IJ SOLN: 4.0000 mg | Freq: Four times a day (QID) | INTRAMUSCULAR | Status: DC | PRN

## 2021-10-12 MED ORDER — GABAPENTIN 100 MG PO CAPS
200.0000 mg | ORAL_CAPSULE | Freq: Every day | ORAL | Status: DC
  Administered 2021-10-12 – 2021-10-16 (×5): 200 mg via ORAL
  Filled 2021-10-12 (×5): qty 2

## 2021-10-12 MED ORDER — VENLAFAXINE HCL ER 37.5 MG PO CP24
37.5000 mg | ORAL_CAPSULE | Freq: Every day | ORAL | Status: DC
  Administered 2021-10-12 – 2021-10-14 (×3): 37.5 mg via ORAL
  Filled 2021-10-12 (×5): qty 1

## 2021-10-12 MED ORDER — ACETAMINOPHEN 650 MG RE SUPP: 650.0000 mg | Freq: Four times a day (QID) | RECTAL | Status: DC | PRN

## 2021-10-12 MED ORDER — LORATADINE 10 MG PO TABS
10.0000 mg | ORAL_TABLET | Freq: Every day | ORAL | Status: DC
  Administered 2021-10-13 – 2021-10-17 (×5): 10 mg via ORAL
  Filled 2021-10-12 (×5): qty 1

## 2021-10-12 MED ORDER — SODIUM CHLORIDE 0.9 % IV SOLN
1.0000 g | INTRAVENOUS | Status: DC
  Administered 2021-10-13: 17:00:00 1 g via INTRAVENOUS
  Filled 2021-10-12: qty 10

## 2021-10-12 MED ORDER — METOPROLOL SUCCINATE ER 25 MG PO TB24
25.0000 mg | ORAL_TABLET | Freq: Every day | ORAL | Status: DC
  Administered 2021-10-13 – 2021-10-17 (×5): 25 mg via ORAL
  Filled 2021-10-12 (×5): qty 1

## 2021-10-12 NOTE — ED Notes (Signed)
C/o a headache  reglan given

## 2021-10-12 NOTE — ED Provider Notes (Signed)
[facility] EMERGENCY DEPARTMENT Provider Note   CSN: [id number] Arrival date & time: 10/12/21  1332     History Chief Complaint  Patient presents with   Weakness   Headache   Fever    Kayla Bennett is a 70 y.o. female with a history of reflux, hiatal hernia, A. fib on metoprolol and Eliquis, diabetes, presenting to the ED with generalized weakness, fever.  Her husband is here to provide supplemental history.  They report the patient has had symptoms for about 2 days.  The patient describes joint pain, muscle aches in her back, pounding headache (she has frequent daily headaches), low energy, loss of appetite.  She denies vomiting.  She is drinking plenty of water.  She denies diarrhea.  She denies chest pain or pressure.  She does feel that she has put on some weight the past couple days.  She did receive a flu vaccine  She reports a history of iron deficiency anemia and states she is scheduled have her first iron infusion this coming Tuesday.  HPI     Past Medical History:  Diagnosis Date   Anemia    Arthritis    Asthma    Breast cancer ([place])    Diabetes ([place])    Dyspnea    Dysrhythmia    afibb on occasion   Family history of adverse reaction to anesthesia    mother had reaction to versed...during surgery, stopped breathing   Fibromyalgia    GERD (gastroesophageal reflux disease)    Headache    History of hiatal hernia    Hypertension    Migraines    Osteopenia    Psoriasis     Patient Active Problem List   Diagnosis Date Noted   Sepsis ([place]) 10/12/2021   Iron deficiency anemia 09/25/2021   Secondary hypercoagulable state ([place]) 07/26/2021   Persistent atrial fibrillation ([place])    Genetic testing 12/26/2020   Malignant neoplasm of upper-outer quadrant of right breast in female, estrogen receptor positive ([place]) 11/28/2020    Past Surgical History:  Procedure Laterality Date   ABDOMINAL HYSTERECTOMY     APPENDECTOMY     BREAST  LUMPECTOMY WITH RADIOACTIVE SEED AND SENTINEL LYMPH NODE BIOPSY Right 02/18/2021   Procedure: RIGHT BREAST LUMPECTOMY WITH RADIOACTIVE SEED AND SENTINEL LYMPH NODE BIOPSY;  Surgeon: [name], MD;  Location: [facility];  Service: General;  Laterality: Right;   broken wrist     CARDIOVERSION N/A 04/04/2021   Procedure: CARDIOVERSION;  Surgeon: Sueanne Margarita, MD;  Location: [facility];  Service: Cardiovascular;  Laterality: N/A;   CARDIOVERSION N/A 08/14/2021   Procedure: CARDIOVERSION;  Surgeon: Geralynn Rile, MD;  Location: [facility];  Service: Cardiovascular;  Laterality: N/A;   CARDIOVERSION N/A 09/19/2021   Procedure: CARDIOVERSION;  Surgeon: Geralynn Rile, MD;  Location: [facility];  Service: Cardiovascular;  Laterality: N/A;   [place]     right wrist   HYSTERECTOMY ABDOMINAL WITH SALPINGO-OOPHORECTOMY     LYSIS OF ADHESION     SHOULDER SURGERY Left    TUBAL LIGATION       OB History   No obstetric history on file.     Family History  Problem Relation Age of Onset   Atrial fibrillation Mother    Heart attack Mother 19   Colon cancer Father 14   Heart attack Father 31   Heart attack Paternal Grandmother 78  Cancer Maternal Uncle        melanoma    Colon cancer Paternal Uncle    Colon cancer Cousin    Atrial fibrillation Cousin     Social History   Tobacco Use   Smoking status: Never   Smokeless tobacco: Never  Vaping Use   Vaping Use: Never used  Substance Use Topics   Alcohol use: Never   Drug use: Never    Home Medications Prior to Admission medications   Medication Sig Start Date End Date Taking? Authorizing Provider  acetaminophen (TYLENOL) 500 MG tablet Take 500 mg by mouth every 6 (six) hours as needed for moderate pain, headache or mild pain.    [provider]  albuterol (VENTOLIN HFA) 108 (90 Base) MCG/ACT inhaler Inhale 1-2 puffs into the lungs every 6 (six)  hours as needed (Asthma).    [provider]  apixaban (ELIQUIS) 5 MG TABS tablet Take 1 tablet (5 mg total) by mouth 2 (two) times daily. Pt needs 90 day supply.  Please keep on file till next refill. 07/10/21 07/10/22  Richardson Dopp T, PA-C  cetirizine (ZYRTEC) 10 MG tablet Take 5 mg by mouth in the morning.    [provider]  cholecalciferol (VITAMIN D3) 25 MCG (1000 UNIT) tablet Take 1,000-2,000 Units by mouth See admin instructions. Take 1000 units in the morning and 2000 units at bedtime    [provider]  ferrous sulfate 325 (65 FE) MG tablet Take 325 mg by mouth daily.    [provider]  fluorouracil (EFUDEX) 5 % cream Apply 1 application topically 2 (two) times daily as needed (precancerous spots on right cheek). Mixed with calcipotriene 0.005%    [provider]  gabapentin (NEURONTIN) 100 MG capsule Take 200 mg by mouth at bedtime.    [provider]  ibuprofen (ADVIL) 200 MG tablet Take 400 mg by mouth every 4 (four) hours as needed for moderate pain or headache.    [provider]  losartan-hydrochlorothiazide (HYZAAR) 100-25 MG tablet Take 1 tablet by mouth daily. 09/27/21   Fenton, Clint R, PA  metFORMIN (GLUCOPHAGE-XR) 500 MG 24 hr tablet Take 500 mg by mouth daily with supper. 10/24/20   [provider]  metoprolol succinate (TOPROL XL) 25 MG 24 hr tablet Take 1 tablet (25 mg total) by mouth daily. 08/09/21   Fenton, Clint R, PA  pantoprazole (PROTONIX) 40 MG tablet Take 40 mg by mouth 2 (two) times daily before a meal.    [provider]  potassium chloride SA (KLOR-CON M20) 20 MEQ tablet Take 1 tablet (20 mEq total) by mouth daily. 07/26/21   Fenton, Clint R, PA  Prenatal Vit-Fe Fumarate-FA (PRENATAL VITAMIN PLUS LOW IRON PO) Take 1 tablet by mouth in the morning.    [provider]  SUMAtriptan (IMITREX) 100 MG tablet Take 100 mg by mouth every 2 (two) hours as needed for migraine. May repeat in 2  hours if headache persists or recurs.    [provider]  tamoxifen (NOLVADEX) 20 MG tablet TAKE 1 TABLET(20 MG) BY MOUTH DAILY 09/08/21   Truitt Merle, MD  triamcinolone (KENALOG) 0.1 % Apply 1 application topically 2 (two) times daily as needed (psoriasis). Mixed with Cetaphil 1:1    [provider]  venlafaxine XR (EFFEXOR-XR) 37.5 MG 24 hr capsule Take 37.5 mg by mouth at bedtime. Take 1 capsule (75 mg) + 1 capsule (37.5 mg) to equal 112.5 mg at bedtime 02/07/21   [provider]  venlafaxine XR (EFFEXOR-XR) 75 MG 24 hr capsule Take 75 mg by mouth at bedtime. Take 1 capsule (75 mg) + 1 capsule (37.5 mg) to equal 112.5 mg  at bedtime 09/29/20   [provider]  vitamin B-12 (CYANOCOBALAMIN) 1000 MCG tablet Take 1,000 mcg by mouth every morning.    [provider]  vitamin C (ASCORBIC ACID) 500 MG tablet Take 500 mg by mouth daily.    [provider]    Allergies    Lotensin [benazepril hcl], Morphine, Erythromycin base, Hydrocodone, Other, Banana, Chocolate, Gluten meal, Tizanidine, and Wound dressing adhesive  Review of Systems   Review of Systems  Constitutional:  Positive for appetite change, fatigue and unexpected weight change. Negative for chills and fever.  Eyes:  Negative for pain and visual disturbance.  Respiratory:  Negative for cough and shortness of breath.   Cardiovascular:  Negative for chest pain and palpitations.  Gastrointestinal:  Negative for abdominal pain and vomiting.  Genitourinary:  Negative for dysuria and hematuria.  Musculoskeletal:  Positive for arthralgias and myalgias.  Skin:  Negative for color change and rash.  Neurological:  Positive for light-headedness and headaches. Negative for syncope.  All other systems reviewed and are negative.  Physical Exam Updated Vital Signs BP (!) 99/55   Pulse 80   Temp 98.8 F (37.1 C)   Resp (!) 24   SpO2 98%   Physical Exam Constitutional:      General: She is  not in acute distress. HENT:     Head: Normocephalic and atraumatic.  Eyes:     Conjunctiva/sclera: Conjunctivae normal.     Pupils: Pupils are equal, round, and reactive to light.  Cardiovascular:     Rate and Rhythm: Normal rate and regular rhythm.  Pulmonary:     Effort: Pulmonary effort is normal. No respiratory distress.  Abdominal:     General: There is no distension.     Tenderness: There is no abdominal tenderness.  Skin:    General: Skin is warm and dry.  Neurological:     General: No focal deficit present.     Mental Status: She is alert. Mental status is at baseline.  Psychiatric:        Mood and Affect: Mood normal.        Behavior: Behavior normal.    ED Results / Procedures / Treatments   Labs (all labs ordered are listed, but only abnormal results are displayed) Labs Reviewed  BASIC METABOLIC PANEL - Abnormal; Notable for the following components:      Result Value   Glucose, Bld 153 (*)    Calcium 8.8 (*)    All other components within normal limits  CBC WITH DIFFERENTIAL/PLATELET - Abnormal; Notable for the following components:   WBC 11.7 (*)    Hemoglobin 11.3 (*)    RDW 17.8 (*)    Neutro Abs 8.5 (*)    Monocytes Absolute 1.1 (*)    All other components within normal limits  BRAIN NATRIURETIC PEPTIDE - Abnormal; Notable for the following components:   B Natriuretic Peptide 277.5 (*)    All other components within normal limits  RESP PANEL BY RT-PCR (FLU A&B, COVID) ARPGX2  CULTURE, BLOOD (ROUTINE X 2)  CULTURE, BLOOD (ROUTINE X 2)  LACTIC ACID, PLASMA  CBC WITH DIFFERENTIAL/PLATELET  URINALYSIS, ROUTINE W REFLEX MICROSCOPIC  HIV ANTIBODY (ROUTINE TESTING W REFLEX)  MAGNESIUM  MAGNESIUM  COMPREHENSIVE METABOLIC PANEL  CBC WITH DIFFERENTIAL/PLATELET  PROCALCITONIN  HEPATIC  FUNCTION PANEL  TROPONIN I (HIGH SENSITIVITY)    EKG EKG Interpretation  Date/Time:  Saturday October 12 2021 15:52:07 EST Ventricular Rate:  87 PR Interval:    QRS  Duration: 75 QT Interval:  359 QTC Calculation: 432 R Axis:   82 Text Interpretation: Atrial fibrillation Confirmed by Octaviano Glow [phone]) on 10/12/2021 4:08:19 PM  Radiology DG Chest 2 View  Result Date: 10/12/2021 CLINICAL DATA:  Chest pain, shortness of breath dizziness EXAM: CHEST - 2 VIEW COMPARISON:  02/04/2008 FINDINGS: Cardiac size is within normal limits. There are no signs of pulmonary edema or focal pulmonary consolidation. There are small linear densities in the left lower lung fields suggesting subsegmental atelectasis or scarring. There is no pleural effusion or pneumothorax. There is possible 3 mm calcific density in the left mid lung fields. Surgical clips are seen in the right axilla. IMPRESSION: There are no signs of pulmonary edema or focal pulmonary consolidation. Small transverse linear densities in the left lower lung fields suggest scarring or subsegmental atelectasis. Electronically Signed   By: Elmer Picker M.D.   On: 10/12/2021 14:53    Procedures Procedures   Medications Ordered in ED Medications  fluconazole (DIFLUCAN) tablet 200 mg (has no administration in time range)  acetaminophen (TYLENOL) tablet 650 mg (has no administration in time range)    Or  acetaminophen (TYLENOL) suppository 650 mg (has no administration in time range)  insulin aspart (novoLOG) injection 0-6 Units (has no administration in time range)  acetaminophen (TYLENOL) tablet 1,000 mg (1,000 mg Oral Given 10/12/21 1420)  sodium chloride 0.9 % bolus 500 mL (0 mLs Intravenous Stopped 10/12/21 1930)  metoCLOPramide (REGLAN) injection 10 mg (10 mg Intravenous Given 10/12/21 1841)  cefTRIAXone (ROCEPHIN) 2 g in sodium chloride 0.9 % 100 mL IVPB (0 g Intravenous Stopped 10/12/21 1930)    ED Course  I have reviewed the triage vital signs and the nursing notes.  Pertinent labs & imaging results that were available during my care of the patient were reviewed by me and considered in my  medical decision making (see chart for details).  Presentation involves an extensive number of treatment options, and is a complaint that carries with it a high risk of complications and morbidity.  The differential diagnosis includes symptomatic anemia versus infection including influenza or COVID versus bacterial infection including UTI versus atypical ACS versus other.  I ordered, reviewed, and interpreted labs, demonstrating a minor leukocytosis, BNP 277, troponin unremarkable. I ordered medication IV fluids, Tylenol, Reglan, IV antibiotic I ordered imaging studies which included x-ray of the I independently visualized and interpreted imaging which showed lower lobe atelectasis, and the monitor tracing which showed rate controlled atrial fibrillation Additional history was obtained from patient's husband  Lactate level normal. Blood cultures were drawn from triage, I think it is reasonable to meet the patient for bacteremia rule out.  She does not demonstrate signs of severe sepsis or shock.  Clinical Course as of 10/12/21 1941  Sat Oct 12, 2021  1824 IV placed [MT]  [place] and flu are negative [MT]  1900 Patient feels he can provide a urine sample now.  Complete medical work-up.  Otherwise a plan for admission for bacteremia rule out.  Vancomycin and Rocephin ordered as broad-spectrum coverage.  Diflucan because she gets yeast infections when started on antibiotics per her report [MT]    Clinical Course User Index [MT] Viana Sleep, Carola Rhine, MD    Final Clinical Impression(s) / ED Diagnoses Final diagnoses:  Fever,  unspecified fever cause    Rx / DC Orders ED Discharge Orders     None        Mischa Brittingham, Carola Rhine, MD 10/12/21 1941

## 2021-10-12 NOTE — ED Provider Notes (Signed)
Emergency Medicine Provider Triage Evaluation Note  Kayla Bennett , a 70 y.o. female  was evaluated in triage.  Pt complains of chest pain.  Review of Systems  Positive: Fever, bodyaches, chest pain, headache Negative: Cough, n/v/d, dysuria  Physical Exam  BP 119/66 (BP Location: Left Arm)   Pulse 67   Temp (!) 103.1 F (39.5 C) (Oral)   Resp 16   SpO2 (!) 88%  Gen:   Awake, no distress   Resp:  Normal effort  MSK:   Moves extremities without difficulty  Other:    Medical Decision Making  Medically screening exam initiated at 2:09 PM.  Appropriate orders placed.  Kayla Bennett was informed that the remainder of the evaluation will be completed by another provider, this initial triage assessment does not replace that evaluation, and the importance of remaining in the ED until their evaluation is complete.  Pt here with cold sxs, is febrile at 103.1, and hypoxic at 88% on RA.     [name], PA-C 10/12/21 1414    [name], MD 10/12/21 [phone]

## 2021-10-12 NOTE — ED Notes (Signed)
Chest pain finger numbness and back pain   with both arm and back pain since yesterday

## 2021-10-12 NOTE — ED Notes (Signed)
The edp is aware of the delay in treatment

## 2021-10-12 NOTE — ED Triage Notes (Signed)
Reports headache, neck pain, back pain, R breast pain, and bilateral fingers tingling since yesterday.  Reports generalized weakness since Feb.

## 2021-10-12 NOTE — ED Notes (Signed)
The pt is waiting for the iv team to come no iv access

## 2021-10-12 NOTE — H&P (Signed)
History and Physical    PLEASE NOTE THAT DRAGON DICTATION SOFTWARE WAS USED IN THE CONSTRUCTION OF THIS NOTE.   Anyah Swallow Hickle WUJ:[id number] DOB: [birth year] DOA: 10/12/2021  PCP: [name], MD  Patient coming from: home   I have personally briefly reviewed patient's old medical records in [place]  Chief Complaint: fever  HPI: Kayla Bennett is a 70 y.o. female with medical history significant for chronic iron deficiency anemia Baseline hemoglobin 9-11, paroxysmal atrial fibrillation chronically anticoagulated on Eliquis, type 2 diabetes mellitus, who is admitted to [facility] on 10/12/2021 with sepsis of unclear source after presenting from home to [facility] ED complaining of fever.   Patient reports 2 days of subjective fever, but in the absence of available thermometer at home to quantify this.  Denies any associated chills, full body rigors, or generalized myalgias.  She reports associated lethargy, generalized weakness in the absence of any acute focal weakness.  She also notes a right frontal headache associated with photophobia and intermittent nausea, in the absence of vomiting, which she reports is consistent with her previous migraines.  Denies any associated neck stiffness or diminished range of motion associated with extension/flexion of the neck.  She notes decline in oral intake over the last 2 days, but no abdominal pain, diarrhea, melena, hematochezia.  No rash.  She notes a chronic rhinitis in the setting of allergic rhinitis, and notes that this improvement is no worse than baseline.  Denies any associated sore throat, cough, shortness of breath, wheezing.  No recent dysuria, gross hematuria, or change in urinary urgency/frequency.  No recent chest pain, palpitations, diaphoresis, dizziness, presyncope, or syncope.  No known recent sick contacts.  Medical history includes a history of stage Ia right-sided breast cancer, diagnosed in January 2022, for  which he has completed radiation therapy and is on tamoxifen.  She also notes a history of psoriasis, but reports that she is not on any current immunosuppressive medications for this.  Denies any new, recent plaque outbreak associated with her psoriasis.   She confirms a history of paroxysmal atrial fibrillation for which she is chronically anticoagulated on Eliquis with associated compliance.  Most recent echocardiogram from February 2022 was notable for LVEF 50 to 55%, no focal wall motion abnormalities, indeterminate diastolic dysfunction, moderately dilated left atrium, and mild to moderate mitral regurgitation.    ED Course:  Vital signs in the ED were notable for the following: Temperature max 103.1; heart rate initially 102, which is decreased to 79 following interval IV fluids, as further detailed below; blood pressure 99/55 -117/63; respiratory rate 16-24, oxygen saturation 94 to 100% on room air.  Labs were notable for the following: BMP notable for the following creatinine 0.82 compared to most recent prior value 0.83 on 09/09/2021.  Lactic acid 1.3.  CBC notable for white blood cell count 11,700 with 73% neutrophils compared to 6.2 on 09/09/2021, hemoglobin 11.3 compared to 9.7 on 09/09/2021.  Urinalysis ordered, result currently pending.  COVID-19/influenza PCR were checked in the ED today and found to be negative.  Imaging and additional notable ED work-up: EKG shows atrial fibrillation with ventricular rate 87, QTc 432, and no evidence of T wave or ST changes, including no evidence of ST elevation.  Chest x-ray shows left basilar atelectasis, but otherwise demonstrates no evidence of acute cardiopulmonary process, including no evidence of infiltrate, edema, effusion, or pneumothorax.  While in the ED, the following were administered: Reglan 10 mg IV x1 for nausea  Rocephin, IV vancomycin, and a 500 cc normal saline bolus.  Subsequently, the patient is being admitted for further evaluation  and management of sepsis of unclear source.     Review of Systems: As per HPI otherwise 10 point review of systems negative.   Past Medical History:  Diagnosis Date   Anemia    Arthritis    Asthma    Breast cancer ([place])    Diabetes ([place])    Dyspnea    Dysrhythmia    afibb on occasion   Family history of adverse reaction to anesthesia    mother had reaction to versed...during surgery, stopped breathing   Fibromyalgia    GERD (gastroesophageal reflux disease)    Headache    History of hiatal hernia    Hypertension    Osteopenia    Psoriasis     Past Surgical History:  Procedure Laterality Date   ABDOMINAL HYSTERECTOMY     APPENDECTOMY     BREAST LUMPECTOMY WITH RADIOACTIVE SEED AND SENTINEL LYMPH NODE BIOPSY Right 02/18/2021   Procedure: RIGHT BREAST LUMPECTOMY WITH RADIOACTIVE SEED AND SENTINEL LYMPH NODE BIOPSY;  Surgeon: [name], MD;  Location: [place];  Service: General;  Laterality: Right;   broken wrist     CARDIOVERSION N/A 04/04/2021   Procedure: CARDIOVERSION;  Surgeon: Sueanne Margarita, MD;  Location: [place] [facility];  Service: Cardiovascular;  Laterality: N/A;   CARDIOVERSION N/A 08/14/2021   Procedure: CARDIOVERSION;  Surgeon: Geralynn Rile, MD;  Location: [place];  Service: Cardiovascular;  Laterality: N/A;   CARDIOVERSION N/A 09/19/2021   Procedure: CARDIOVERSION;  Surgeon: Geralynn Rile, MD;  Location: [place];  Service: Cardiovascular;  Laterality: N/A;   [place]     right wrist   HYSTERECTOMY ABDOMINAL WITH SALPINGO-OOPHORECTOMY     LYSIS OF ADHESION     SHOULDER SURGERY Left    TUBAL LIGATION      Social History:  reports that she has never smoked. She has never used smokeless tobacco. She reports that she does not drink alcohol and does not use drugs.   Allergies  Allergen Reactions   Lotensin [Benazepril Hcl] Swelling   Morphine Itching and Rash    Erythromycin Base Other (See Comments)    unsure   Hydrocodone Other (See Comments)    Unknown     Other Swelling    Mustard - vaginal swelling/pain    Banana     vaginal swelling/pain   Chocolate     vaginal swelling/pain   Gluten Meal Diarrhea    Upset stomach    Tizanidine     Lowered blood pressure    Wound Dressing Adhesive     Skin burns, blisters     Family History  Problem Relation Age of Onset   Atrial fibrillation Mother    Heart attack Mother 32   Colon cancer Father 32   Heart attack Father 53   Heart attack Paternal Grandmother 14   Cancer Maternal Uncle        melanoma    Colon cancer Paternal Uncle    Colon cancer Cousin    Atrial fibrillation Cousin     Family history reviewed and not pertinent    Prior to Admission medications   Medication Sig Start Date End Date Taking? Authorizing Provider  acetaminophen (TYLENOL) 500 MG tablet Take 500 mg by mouth every 6 (six) hours as needed for moderate pain, headache or  mild pain.    [provider]  albuterol (VENTOLIN HFA) 108 (90 Base) MCG/ACT inhaler Inhale 1-2 puffs into the lungs every 6 (six) hours as needed (Asthma).    [provider]  apixaban (ELIQUIS) 5 MG TABS tablet Take 1 tablet (5 mg total) by mouth 2 (two) times daily. Pt needs 90 day supply.  Please keep on file till next refill. 07/10/21 07/10/22  Richardson Dopp T, PA-C  cetirizine (ZYRTEC) 10 MG tablet Take 5 mg by mouth in the morning.    [provider]  cholecalciferol (VITAMIN D3) 25 MCG (1000 UNIT) tablet Take 1,000-2,000 Units by mouth See admin instructions. Take 1000 units in the morning and 2000 units at bedtime    [provider]  ferrous sulfate 325 (65 FE) MG tablet Take 325 mg by mouth daily.    [provider]  fluorouracil (EFUDEX) 5 % cream Apply 1 application topically 2 (two) times daily as needed (precancerous spots on right cheek). Mixed with calcipotriene 0.005%    [provider]  gabapentin (NEURONTIN) 100 MG capsule Take 200 mg by mouth at bedtime.    [provider]  ibuprofen (ADVIL) 200 MG tablet Take 400 mg by mouth every 4 (four) hours as needed for moderate pain or headache.    [provider]  losartan-hydrochlorothiazide (HYZAAR) 100-25 MG tablet Take 1 tablet by mouth daily. 09/27/21   Fenton, Clint R, PA  metFORMIN (GLUCOPHAGE-XR) 500 MG 24 hr tablet Take 500 mg by mouth daily with supper. 10/24/20   [provider]  metoprolol succinate (TOPROL XL) 25 MG 24 hr tablet Take 1 tablet (25 mg total) by mouth daily. 08/09/21   Fenton, Clint R, PA  pantoprazole (PROTONIX) 40 MG tablet Take 40 mg by mouth 2 (two) times daily before a meal.    [provider]  potassium chloride SA (KLOR-CON M20) 20 MEQ tablet Take 1 tablet (20 mEq total) by mouth daily. 07/26/21   Fenton, Clint R, PA  Prenatal Vit-Fe Fumarate-FA (PRENATAL VITAMIN PLUS LOW IRON PO) Take 1 tablet by mouth in the morning.    [provider]  SUMAtriptan (IMITREX) 100 MG tablet Take 100 mg by mouth every 2 (two) hours as needed for migraine. May repeat in 2 hours if headache persists or recurs.    [provider]  tamoxifen (NOLVADEX) 20 MG tablet TAKE 1 TABLET(20 MG) BY MOUTH DAILY 09/08/21   Truitt Merle, MD  triamcinolone (KENALOG) 0.1 % Apply 1 application topically 2 (two) times daily as needed (psoriasis). Mixed with Cetaphil 1:1    [provider]  venlafaxine XR (EFFEXOR-XR) 37.5 MG 24 hr capsule Take 37.5 mg by mouth at bedtime. Take 1 capsule (75 mg) + 1 capsule (37.5 mg) to equal 112.5 mg at bedtime 02/07/21   [provider]  venlafaxine XR (EFFEXOR-XR) 75 MG 24 hr capsule Take 75 mg by mouth at bedtime. Take 1 capsule (75 mg) + 1 capsule (37.5 mg) to equal 112.5 mg  at bedtime 09/29/20   [provider]  vitamin B-12 (CYANOCOBALAMIN) 1000 MCG tablet Take 1,000 mcg by mouth every morning.    [provider]  vitamin C (ASCORBIC ACID) 500 MG tablet Take 500 mg by mouth daily.    [provider]     Objective    Physical Exam: Vitals:   10/12/21 1800 10/12/21 1830 10/12/21 1844 10/12/21 1910  BP: (!) 108/56   (!) 99/55  Pulse: 94 93  80  Resp: (!) 23 14  (!) 24  Temp:   98.8 F (37.1 C)   TempSrc:      SpO2: 96% 100%  98%    General: appears to be stated age; alert, oriented Skin: warm, dry, no rash Head:  AT/[place] Mouth:  Oral mucosa membranes appear dry, normal dentition Neck: supple; trachea midline Heart:  RRR; did not appreciate any M/R/G Lungs: CTAB, did not appreciate any wheezes, rales, or rhonchi Abdomen: + BS; soft, ND, NT Vascular: 2+ pedal pulses b/l; 2+ radial pulses b/l Extremities: no peripheral edema, no muscle wasting Neuro: strength and sensation intact in upper and lower extremities b/l   Labs on Admission: I have personally reviewed following labs and imaging studies  CBC: Recent Labs  Lab 10/12/21 1639  WBC 11.7*  NEUTROABS 8.5*  HGB 11.3*  HCT 36.1  MCV 89.6  PLT 086   Basic Metabolic Panel: Recent Labs  Lab 10/12/21 1413  NA 135  K 3.9  CL 101  CO2 24  GLUCOSE 153*  BUN 12  CREATININE 0.82  CALCIUM 8.8*   GFR: CrCl cannot be calculated (Unknown ideal weight.). Liver Function Tests: No results for input(s): AST, ALT, ALKPHOS, BILITOT, PROT, ALBUMIN in the last 168 hours. No results for input(s): LIPASE, AMYLASE in the last 168 hours. No results for input(s): AMMONIA in the last 168 hours. Coagulation Profile: No results for input(s): INR, PROTIME in the last 168 hours. Cardiac Enzymes: No results for input(s): CKTOTAL, CKMB, CKMBINDEX, TROPONINI in the last 168 hours. BNP (last 3 results) No results for input(s): PROBNP in the last 8760 hours. HbA1C: No results for input(s): HGBA1C in the last 72 hours. CBG: No results for input(s): GLUCAP in the last 168 hours. Lipid Profile: No results for input(s):  CHOL, HDL, LDLCALC, TRIG, CHOLHDL, LDLDIRECT in the last 72 hours. Thyroid Function Tests: No results for input(s): TSH, T4TOTAL, FREET4, T3FREE, THYROIDAB in the last 72 hours. Anemia Panel: No results for input(s): VITAMINB12, FOLATE, FERRITIN, TIBC, IRON, RETICCTPCT in the last 72 hours. Urine analysis: No results found for: COLORURINE, APPEARANCEUR, [place], [place], [place], [place], BILIRUBINUR, KETONESUR, PROTEINUR, [place], NITRITE, LEUKOCYTESUR  Radiological Exams on Admission: DG Chest 2 View  Result Date: 10/12/2021 CLINICAL DATA:  Chest pain, shortness of breath dizziness EXAM: CHEST - 2 VIEW COMPARISON:  02/04/2008 FINDINGS: Cardiac size is within normal limits. There are no signs of pulmonary edema or focal pulmonary consolidation. There are small linear densities in the left lower lung fields suggesting subsegmental atelectasis or scarring. There is no pleural effusion or pneumothorax. There is possible 3 mm calcific density in the left mid lung fields. Surgical clips are seen in the right axilla. IMPRESSION: There are no signs of pulmonary edema or focal pulmonary consolidation. Small transverse linear densities in the left lower lung fields suggest scarring or subsegmental atelectasis. Electronically Signed   By: Elmer Picker M.D.   On: 10/12/2021 14:53     EKG: Independently reviewed, with result as described above.    Assessment/Plan   Principal Problem:   Sepsis ([place]) Active Problems:   Malignant neoplasm of upper-outer quadrant of right breast in female, estrogen receptor positive (HCC)   Persistent atrial fibrillation (HCC)   Iron deficiency anemia   Fever   Nausea   Headache   Generalized weakness   Diabetes (HCC)   Hypertension   GERD (gastroesophageal reflux disease)     #) Sepsis: Presents with 2 days of fever, with temperature max 103.1 in  the ED, associated with tachycardia and tachypnea.  Felt to be consistent with underlying infection,  with specific source is unclear at this time.  Lactic acid nonelevated at 1.3.  Patient sepsis does not meet criteria to be considered severe nature in the absence of any evidence of associated endorgan damage including aforementioned nonelevated LA. no evidence of hypotension. in the absence of lactic acid level that is greater than or equal to 4.0, and in the absence of any associated hypotension refractory to IVF's, there are no indications for administration of a 30 mL/kg IVF bolus at this time.  COVID-19/influenza PCR found to be negative.  Chest x-ray shows basilar atelectasis, but no acute process, with the possibility of a false negative finding in the setting of concomitant dehydration.  Given associated nausea, will add on liver enzymes.  No meningismus signs to suggest meningitis.  No rash. UA is currently pending. If not consistent with urinary tract infection, can consider pursuit of CT chest to further evaluate for underlying pneumonia given increased risk for false negative chest x-ray in the setting of dehydration.  Pending this further evaluation for identification of underlying infectious source, will continue the broad-spectrum IV antibiotics initiated in the ED today.  Blood cultures x2 were collected prior to initiation of these antibiotics.   In terms of potential noninfectious sources of patient's subjective fever, acute PE/DVT appears less likely given patient's compliance with chronic anticoagulation on Eliquis in setting of persistent atrial fibrillation.  Considered the possibility of fever due to malignancy given her history of right-sided breast cancer,, this appears to be a stage Ia malignancy status post radiation, with good ensuing compliance on tamoxifen, rendering possibilty of associated fever to appear less likely at this time. Also, consider possibility of fever in the basis of systemic inflammation noting a history of psoriasis, patient denies any recent plaque or evidence  to suggest acute flare of this process.    Plan: Monitor for results of blood cx's x 2. Continue Rocephin/IV Vanc pending further eval of infectious source. Follow for results of UA and 1 liver enzymes.  Check CRP and ESR.  Procalcitonin. Continuous ns at 100 cc./hr. CBC and CMP in the morning.       #) Generalized weakness: 2 day duration of generalized weakness, in the absence of any evidence of acute focal neurologic deficits, including no evidence of acute focal weakness. Consequently, acute ischemic CVA is felt to be less likely at this time.  Suspect contribution from physiologic stress stemming from presenting suspected sepsis, including objective fever, as further detailed above.    Plan: work-up and management of presenting sepsis, as described above, including urinalysis, procalcitonin, ESR, CRP, and liver enzymes. Physical therapy consult has been placed. Fall precautions. Check TSH, MMA.       #) Headache: Right frontal headache associated phonophobia and intermittent nausea, which the patient reports is very consistent in terms of location, intensity, and associated symptoms relative to her previous migraines.  Not associate with any meningismus to suggest underlying meningitis.  Additionally, denies history of any acute focal neurologic deficits.  Appears outside of the window for prn Imitrex at this time.  In the setting of no history of CHF, ckd, and with renal function noted to be at baseline, will order prn Toradol.   Plan: Prn Toradol.  Prn Zofran.  Repeat CBC in the morning.  IV fluids, as above.       #) Chronic iron deficiency anemia: Documented history of such, on daily oral  iron supplementation at home.  Associated with baseline hemoglobin range of 9-11, with presenting hemoglobin consistent with this range.  No evidence of active bleed at this time.   Plan: Repeat CBC in the morning.       #) Persistent atrial fibrillation: Documented history of such. In  the setting of a CHA2DS2-VASc score of 4, there is an indication for the patient to be on chronic anticoagulation for thromboembolic prophylaxis. Consistent with this, the patient is chronically anticoagulated on Eliquis. Home AV nodal blocking regimen: Metoprolol succinate.  Most recent echocardiogram performed in February 2022, with results as further detailed above. Presenting EKG demonstrates rate controlled atrial fibrillation.     Plan: monitor strict I's & O's and daily weights. Repeat BMP and CBC in the morning. Check serum magnesium level. Continue home AV nodal blocking regimen, as above.  Continue home Eliquis.       #) Type 2 Diabetes Mellitus: documented history of such. Home insulin regimen: none. Home oral hypoglycemic agents: metformin. presenting blood sugar 153.   Plan: accuchecks QAC and HS with low dose SSI. hold home oral hypoglycemic agents, as above.      #) GERD: On Protonix as an outpatient.  Plan: Continue PPI.       #) Essential Hypertension: documented history of such, with outpatient antihypertensive regimen including losartan, HCTZ, Toprol-XL.  Systolic blood pressures pressure in the ED today in the high 90s to 120 mmHg. In the setting of presenting sepsis as well as clinical appearance of dehydration, will hold home losartan and HCTZ for now.  Plan: Close monitoring of subsequent blood pressure via routine vital signs. Hold home losartan and hctz,as above.  Continue metoprolol succinate.     #) Allergic rhinitis, diabetes or usage in the setting of seasonal allergies.  On cetirizine as an outpatient.  Plan: Continue cetirizine.      DVT prophylaxis: continue home eliquis  Code Status: Full code Family Communication: none Disposition Plan: Per Rounding Team Consults called: none;  Admission status: inpatient; med-tele  Warrants inpatient status on basis of requiring further evaluation and management of presenting sepsis with objective  fever, warranting additional evaluation for source of suspected infection, requiring interval IV abx while engaging in this source identification.    PLEASE NOTE THAT DRAGON DICTATION SOFTWARE WAS USED IN THE CONSTRUCTION OF THIS NOTE.   [place] DO [facility]  From [place]   10/12/2021, 7:26 PM

## 2021-10-12 NOTE — ED Notes (Signed)
Lab cannot get the pts labs at triage

## 2021-10-12 NOTE — Progress Notes (Signed)
Pharmacy Antibiotic Note  Kayla Bennett is a 70 y.o. female admitted on 10/12/2021 with sepsis.  Pharmacy has been consulted for vancomycin dosing.  Plan: Vancomycin 1750mg  x1 then 1000mg  IV q24h (eAUC 476, Cr 0.82, Vd 0.5) Ceftriaxone per MD dosing -Monitor renal function, clinical status, and antibiotic plan -Order vanc levels as necessary  Weight: 90.7 kg (199 lb 15.3 oz)  Temp (24hrs), Avg:101 F (38.3 C), Min:98.8 F (37.1 C), Max:103.1 F (39.5 C)  Recent Labs  Lab 10/12/21 1413 10/12/21 1639  WBC  --  11.7*  CREATININE 0.82  --   LATICACIDVEN  --  1.3    Estimated Creatinine Clearance: 66.8 mL/min (by C-G formula based on SCr of 0.82 mg/dL).    Allergies  Allergen Reactions   Lotensin [Benazepril Hcl] Swelling   Morphine Itching and Rash   Erythromycin Base Other (See Comments)    unsure   Hydrocodone Other (See Comments)    Unknown     Other Swelling    Mustard - vaginal swelling/pain    Banana     vaginal swelling/pain   Chocolate     vaginal swelling/pain   Gluten Meal Diarrhea    Upset stomach    Tizanidine     Lowered blood pressure    Wound Dressing Adhesive     Skin burns, blisters     Antimicrobials this admission: Vanc 11/26 >>  Ceftriaxone 11/26 >>   Dose adjustments this admission: N/A  Microbiology results: 11/26 BCx:   Thank you for allowing pharmacy to be a part of this patient's care.  Joetta Manners, PharmD, [facility] Clinical Pharmacist ED RPh Phone: [place]: [phone]

## 2021-10-12 NOTE — ED Notes (Signed)
Pt ambulatory to restroom

## 2021-10-13 ENCOUNTER — Inpatient Hospital Stay (HOSPITAL_COMMUNITY): Payer: Medicare Other

## 2021-10-13 DIAGNOSIS — R509 Fever, unspecified: Secondary | ICD-10-CM

## 2021-10-13 DIAGNOSIS — E119 Type 2 diabetes mellitus without complications: Secondary | ICD-10-CM | POA: Diagnosis not present

## 2021-10-13 DIAGNOSIS — R519 Headache, unspecified: Secondary | ICD-10-CM | POA: Diagnosis not present

## 2021-10-13 DIAGNOSIS — I4819 Other persistent atrial fibrillation: Secondary | ICD-10-CM

## 2021-10-13 DIAGNOSIS — Z170 Estrogen receptor positive status [ER+]: Secondary | ICD-10-CM

## 2021-10-13 DIAGNOSIS — A419 Sepsis, unspecified organism: Secondary | ICD-10-CM | POA: Diagnosis not present

## 2021-10-13 DIAGNOSIS — N6001 Solitary cyst of right breast: Secondary | ICD-10-CM

## 2021-10-13 DIAGNOSIS — C50411 Malignant neoplasm of upper-outer quadrant of right female breast: Secondary | ICD-10-CM

## 2021-10-13 LAB — HEPATIC FUNCTION PANEL
ALT: 19 U/L (ref 0–44)
AST: 26 U/L (ref 15–41)
Albumin: 2.8 g/dL — ABNORMAL LOW (ref 3.5–5.0)
Alkaline Phosphatase: 50 U/L (ref 38–126)
Bilirubin, Direct: 0.2 mg/dL (ref 0.0–0.2)
Indirect Bilirubin: 0.7 mg/dL (ref 0.3–0.9)
Total Bilirubin: 0.9 mg/dL (ref 0.3–1.2)
Total Protein: 6 g/dL — ABNORMAL LOW (ref 6.5–8.1)

## 2021-10-13 LAB — CBC WITH DIFFERENTIAL/PLATELET
Abs Immature Granulocytes: 0.06 10*3/uL (ref 0.00–0.07)
Basophils Absolute: 0 10*3/uL (ref 0.0–0.1)
Basophils Relative: 0 %
Eosinophils Absolute: 0 10*3/uL (ref 0.0–0.5)
Eosinophils Relative: 0 %
HCT: 34.4 % — ABNORMAL LOW (ref 36.0–46.0)
Hemoglobin: 10.7 g/dL — ABNORMAL LOW (ref 12.0–15.0)
Immature Granulocytes: 1 %
Lymphocytes Relative: 17 %
Lymphs Abs: 2 10*3/uL (ref 0.7–4.0)
MCH: 28.2 pg (ref 26.0–34.0)
MCHC: 31.1 g/dL (ref 30.0–36.0)
MCV: 90.5 fL (ref 80.0–100.0)
Monocytes Absolute: 0.9 10*3/uL (ref 0.1–1.0)
Monocytes Relative: 8 %
Neutro Abs: 8.5 10*3/uL — ABNORMAL HIGH (ref 1.7–7.7)
Neutrophils Relative %: 74 %
Platelets: 152 10*3/uL (ref 150–400)
RBC: 3.8 MIL/uL — ABNORMAL LOW (ref 3.87–5.11)
RDW: 17.9 % — ABNORMAL HIGH (ref 11.5–15.5)
WBC: 11.6 10*3/uL — ABNORMAL HIGH (ref 4.0–10.5)
nRBC: 0 % (ref 0.0–0.2)

## 2021-10-13 LAB — COMPREHENSIVE METABOLIC PANEL
ALT: 19 U/L (ref 0–44)
AST: 26 U/L (ref 15–41)
Albumin: 2.8 g/dL — ABNORMAL LOW (ref 3.5–5.0)
Alkaline Phosphatase: 49 U/L (ref 38–126)
Anion gap: 10 (ref 5–15)
BUN: 11 mg/dL (ref 8–23)
CO2: 22 mmol/L (ref 22–32)
Calcium: 7.8 mg/dL — ABNORMAL LOW (ref 8.9–10.3)
Chloride: 103 mmol/L (ref 98–111)
Creatinine, Ser: 0.82 mg/dL (ref 0.44–1.00)
GFR, Estimated: >60 mL/min (ref >60)
Glucose, Bld: 174 mg/dL — ABNORMAL HIGH (ref 70–99)
Potassium: 3.1 mmol/L — ABNORMAL LOW (ref 3.5–5.1)
Sodium: 135 mmol/L (ref 135–145)
Total Bilirubin: 0.8 mg/dL (ref 0.3–1.2)
Total Protein: 6.1 g/dL — ABNORMAL LOW (ref 6.5–8.1)

## 2021-10-13 LAB — MAGNESIUM
Magnesium: 1.5 mg/dL — ABNORMAL LOW (ref 1.7–2.4)
Magnesium: 1.5 mg/dL — ABNORMAL LOW (ref 1.7–2.4)

## 2021-10-13 LAB — HIV ANTIBODY (ROUTINE TESTING W REFLEX): HIV Screen 4th Generation wRfx: NONREACTIVE

## 2021-10-13 LAB — SEDIMENTATION RATE: Sed Rate: 40 mm/hr — ABNORMAL HIGH (ref 0–22)

## 2021-10-13 LAB — PROCALCITONIN: Procalcitonin: <0.1 ng/mL

## 2021-10-13 LAB — C-REACTIVE PROTEIN: CRP: 18.5 mg/dL — ABNORMAL HIGH (ref <1.0)

## 2021-10-13 LAB — TSH: TSH: 2.875 u[IU]/mL (ref 0.350–4.500)

## 2021-10-13 LAB — GLUCOSE, CAPILLARY
Glucose-Capillary: 153 mg/dL — ABNORMAL HIGH (ref 70–99)
Glucose-Capillary: 172 mg/dL — ABNORMAL HIGH (ref 70–99)
Glucose-Capillary: 138 mg/dL — ABNORMAL HIGH (ref 70–99)

## 2021-10-13 MED ORDER — HYDROCODONE-ACETAMINOPHEN 5-325 MG PO TABS: 1.0000 | ORAL_TABLET | Freq: Four times a day (QID) | ORAL | Status: DC | PRN

## 2021-10-13 MED ORDER — IOHEXOL 300 MG/ML  SOLN
100.0000 mL | Freq: Once | INTRAMUSCULAR | Status: AC | PRN
  Administered 2021-10-13: 12:00:00 100 mL via INTRAVENOUS

## 2021-10-13 MED ORDER — POTASSIUM CHLORIDE CRYS ER 20 MEQ PO TBCR
40.0000 meq | EXTENDED_RELEASE_TABLET | Freq: Two times a day (BID) | ORAL | Status: AC
  Administered 2021-10-13 (×2): 40 meq via ORAL
  Filled 2021-10-13 (×2): qty 2

## 2021-10-13 MED ORDER — LACTATED RINGERS IV SOLN: INTRAVENOUS | Status: AC

## 2021-10-13 MED ORDER — MAGNESIUM SULFATE 2 GM/50ML IV SOLN
2.0000 g | Freq: Once | INTRAVENOUS | Status: AC
  Administered 2021-10-13: 11:00:00 2 g via INTRAVENOUS
  Filled 2021-10-13: qty 50

## 2021-10-13 MED ORDER — GADOBUTROL 1 MMOL/ML IV SOLN
9.0000 mL | Freq: Once | INTRAVENOUS | Status: AC | PRN
  Administered 2021-10-13: 20:00:00 9 mL via INTRAVENOUS

## 2021-10-13 MED ORDER — SENNOSIDES-DOCUSATE SODIUM 8.6-50 MG PO TABS
2.0000 | ORAL_TABLET | Freq: Every day | ORAL | Status: DC
  Filled 2021-10-13 (×2): qty 2

## 2021-10-13 NOTE — Evaluation (Signed)
Physical Therapy Evaluation Patient Details Name: Kayla Bennett MRN: [id number] DOB: Nov 15, 1951 Today's Date: 10/13/2021  History of Present Illness  70 y.o. female who is admitted to [facility] on 10/12/2021 with sepsis of unclear source. 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 PTA patient was independent without use of an assistive device. She progressively grew weaker with decr balance as she began to feel sicker and sicker.  Pt currently requires minguard assist for safety with ambulation with one loss of balance to her left.  Anticipate patient will benefit from PT to address problems listed below. Will continue to follow acutely to maximize functional mobility independence and safety.          Recommendations for follow up therapy are one component of a multi-disciplinary discharge planning process, led by the attending physician.  Recommendations may be updated based on patient status, additional functional criteria and insurance authorization.  Follow Up Recommendations Outpatient PT (for balance training); Patient is undecided if she will start PT due to her busy schedule    Assistance Recommended at Discharge None  Functional Status Assessment Patient has had a recent decline in their functional status and demonstrates the ability to make significant improvements in function in a reasonable and predictable amount of time.  Equipment Recommendations  None recommended by PT    Recommendations for Other Services       Precautions / Restrictions Precautions Precautions: Fall Precaution Comments: reports recent decr balance with near fall x 1      Mobility  Bed Mobility               General bed mobility comments: up at EOB on arrival     Transfers Overall transfer level: Independent Equipment used: None                    Ambulation/Gait Ambulation/Gait assistance: Counsellor (Feet): 160 Feet Assistive device: None Gait Pattern/deviations: Staggering left Gait velocity: decr Gait velocity interpretation: 1.31 - 2.62 ft/sec, indicative of limited community ambulator   General Gait Details: generally decr velocity; one episode of stagger step to her left with pt recovering independently  Stairs            Wheelchair Mobility    Modified Rankin (Stroke Patients Only)       Balance Overall balance assessment: Needs assistance   Sitting balance-Leahy Scale: Good       Standing balance-Leahy Scale: Good                   Standardized Balance Assessment Standardized Balance Assessment : Berg Balance Test Berg Balance Test Sit to Stand: Able to stand without using hands and stabilize independently Standing Unsupported: Able to stand safely 2 minutes Sitting with Back Unsupported but Feet Supported on Floor or Stool: Able to sit safely and securely 2 minutes Stand to Sit: Sits safely with minimal use of hands Transfers: Able to transfer safely, minor use of hands Standing Unsupported with Eyes Closed: Able to stand 10 seconds safely Standing Ubsupported with Feet Together: Able to place feet together independently and stand 1 minute safely From Standing, Reach Forward with Outstretched Arm: Can reach confidently >25 cm (10") From Standing Position, Pick up Object from Floor: Able to pick up shoe safely and easily From Standing Position, Turn  to Look Behind Over each Shoulder: Looks behind from both sides and weight shifts well Turn 360 Degrees: Able to turn 360 degrees safely but slowly Standing Unsupported, Alternately Place Feet on Step/Stool: Able to stand independently and complete 8 steps >20 seconds Standing Unsupported, One Foot in Front: Able to plae foot ahead of  the other independently and hold 30 seconds Standing on One Leg: Able to lift leg independently and hold equal to or more than 3 seconds Total Score: 50         Pertinent Vitals/Pain Pain Assessment: No/denies pain    Home Living Family/patient expects to be discharged to:: Private residence Living Arrangements: Spouse/significant other Available Help at Discharge: Family Type of Home: House Home Access: Level entry       Home Layout: One level Home Equipment: Cane - single Barista (2 wheels);Shower seat;BSC/3in1      Prior Function Prior Level of Function : Independent/Modified Independent             Mobility Comments: no device but having some recent balance problems; denies falls but one close call ADLs Comments: uses shower seat; hand-held shower     Hand Dominance        Extremity/Trunk Assessment   Upper Extremity Assessment Upper Extremity Assessment: Generalized weakness    Lower Extremity Assessment Lower Extremity Assessment: Generalized weakness    Cervical / Trunk Assessment Cervical / Trunk Assessment: Other exceptions Cervical / Trunk Exceptions: overweight  Communication   Communication: No difficulties  Cognition Arousal/Alertness: Awake/alert Behavior During Therapy: WFL for tasks assessed/performed Overall Cognitive Status: Within Functional Limits for tasks assessed                                          General Comments      Exercises     Assessment/Plan    PT Assessment Patient needs continued PT services  PT Problem List Decreased strength;Decreased balance;Decreased mobility;Decreased knowledge of use of DME       PT Treatment Interventions DME instruction;Gait training;Functional mobility training;Therapeutic activities;Therapeutic exercise;Balance training;Patient/family education    PT Goals (Current goals can be found in the Care Plan section)  Acute Rehab PT Goals Patient Stated  Goal: regain her strength and balance; not need an assistive device PT Goal Formulation: With patient Time For Goal Achievement: 10/27/21 Potential to Achieve Goals: Good    Frequency Min 3X/week   Barriers to discharge        Co-evaluation               AM-PAC PT "6 Clicks" Mobility  Outcome Measure Help needed turning from your back to your side while in a flat bed without using bedrails?: None Help needed moving from lying on your back to sitting on the side of a flat bed without using bedrails?: None Help needed moving to and from a bed to a chair (including a wheelchair)?: A Little Help needed standing up from a chair using your arms (e.g., wheelchair or bedside chair)?: A Little Help needed to walk in hospital room?: A Little Help needed climbing 3-5 steps with a railing? : A Little 6 Click Score: 20    End of Session Equipment Utilized During Treatment: Gait belt Activity Tolerance: Patient tolerated treatment well Patient left: in chair;with call bell/phone within reach;with chair alarm set   PT Visit Diagnosis: Unsteadiness on feet (R26.81);Muscle weakness (generalized) (M62.81)  Time: 0601-5615 PT Time Calculation (min) (ACUTE ONLY): 25 min   Charges:   PT Evaluation $PT Eval Low Complexity: 1 Low PT Treatments $Therapeutic Activity: 8-22 mins         Arby Barrette, PT Acute Rehabilitation Services  Pager [phone] Office [phone]   Rexanne Mano 10/13/2021, 10:59 AM

## 2021-10-13 NOTE — Consult Note (Signed)
Date of Admission:  10/12/2021          Reason for Consult: fever and sepsis of unknown source   Referring Provider: Irene Pap, MD   Assessment:  Fever with worsening erythema and tenderness of 2 cystic areas where she had recent surgery Severe headaches with neck stiffness History of breast cancer status post lung ectomy and axillary node dissection Atrial fibrillation status post cardioversion Obesity Diabetes mellitus  Plan:  Continue current vancomycin and ceftriaxone I am ordering MRI with and without contrast of the brain to evaluate for meningitis though my suspicion for this is lower than #3 Ask IR to aspirate this to cystic areas and sent for culture Follow-up blood culture  Principal Problem:   Sepsis ([place]) Active Problems:   Malignant neoplasm of upper-outer quadrant of right breast in female, estrogen receptor positive ([place])   Persistent atrial fibrillation (H[facility])   Iron deficiency anemia   Fever   Nausea   Headache   Generalized weakness   Diabetes (H[facility])   Hypertension   GERD (gastroesophageal reflux disease)   Scheduled Meds:  apixaban  5 mg Oral BID   gabapentin  200 mg Oral QHS   insulin aspart  0-6 Units Subcutaneous TID WC   loratadine  10 mg Oral Daily   metoprolol succinate  25 mg Oral Daily   pantoprazole  40 mg Oral BID AC   potassium chloride  40 mEq Oral BID   [START ON 10/14/2021] senna-docusate  2 tablet Oral Q0600   tamoxifen  20 mg Oral Daily   venlafaxine XR  37.5 mg Oral QHS   venlafaxine XR  75 mg Oral QHS   Continuous Infusions:  cefTRIAXone (ROCEPHIN)  IV     lactated ringers     PRN Meds:.acetaminophen **OR** acetaminophen, albuterol, HYDROcodone-acetaminophen, ketorolac, ondansetron (ZOFRAN) IV  HPI: Kayla Bennett is a 70 y.o. female with past medical history significant for breast cancer status postlumpectomy and axillary lymph node dissection, radiation who was recently in the hospital for cardioversion of her  atrial fibrillation.  She has been in her usual state of health until this past Friday when she began to have a headache and then onset of subjective fever chills numbness in the tips of her fingers as well as pain migrating in her right elbow and left elbow.  She does suffer from chronic headaches her headache that she now has was much worse than any of those and she also developed some neck stiffness.  She has 2 cystic areas that have been followed by her breast surgeon which had not been causing her trouble but recently with the onset of her fevers and headaches have become exquisitely tender and erythematous.  In the ER she was found to be febrile to 103 degrees.  He is also tachycardic tachypneic and somewhat confused.  Chest x-ray that showed only atelectasis.  Blood cultures were taken and she was started on vancomycin and ceftriaxone.  I am somewhat surprised with her history of headache fever and stiff neck that lumbar puncture was never pursued.  Was called earlier by Dr. [place] Crane with guards this patient who had fevers and sepsis that cause and recommended CT chest abdomen pelvis this was performed and CT shows 2 cystic areas 1 in the posterior lateral breast tissue and 1 the axilla the course bit onto the cystic areas the patient herself knows about and which are quite tender on exam.  I suspect that she may have infection of  these cystic areas and that she would benefit from IR guided aspiration for culture.  She has improved on empiric vancomycin and ceftriaxone.  With her being on anticoagulation and obtaining a lumbar puncture is not trivial.  I will order an MRI of the brain tonight which hopefully will help [place] exclude CNS infection.  I spent 85 minutes with the patient including than 50% of the time in face to face counseling of the patient during the work-up for her fevers and sepsis, differential that includes meningitis as well as infection of her cyst after surgery, personally  reviewing CT chest abdomen pelvis CBC CMP updated blood cultures along with review of medical records in preparation for the visit and during the visit and in coordination of her care.      Review of Systems: Review of Systems  Constitutional:  Positive for chills, fever and malaise/fatigue. Negative for weight loss.  HENT:  Negative for congestion and sore throat.   Eyes:  Negative for blurred vision and photophobia.  Respiratory:  Negative for cough, shortness of breath and wheezing.   Cardiovascular:  Negative for chest pain, palpitations and leg swelling.  Gastrointestinal:  Negative for abdominal pain, blood in stool, constipation, diarrhea, heartburn, melena, nausea and vomiting.  Genitourinary:  Negative for dysuria, flank pain and hematuria.  Musculoskeletal:  Negative for back pain, falls, joint pain and myalgias.  Skin:  Negative for itching and rash.  Neurological:  Positive for tingling, weakness and headaches. Negative for dizziness, focal weakness and loss of consciousness.  Endo/Heme/Allergies:  Does not bruise/bleed easily.  Psychiatric/Behavioral:  Negative for depression and suicidal ideas. The patient does not have insomnia.    Past Medical History:  Diagnosis Date   Anemia    Arthritis    Asthma    Breast cancer ([place])    Diabetes ([place])    Dyspnea    Dysrhythmia    afibb on occasion   Family history of adverse reaction to anesthesia    mother had reaction to versed...during surgery, stopped breathing   Fibromyalgia    GERD (gastroesophageal reflux disease)    Headache    History of hiatal hernia    Hypertension    Migraines    Osteopenia    Psoriasis     Social History   Tobacco Use   Smoking status: Never   Smokeless tobacco: Never  Vaping Use   Vaping Use: Never used  Substance Use Topics   Alcohol use: Never   Drug use: Never    Family History  Problem Relation Age of Onset   Atrial fibrillation Mother    Heart attack Mother 25   Colon  cancer Father 26   Heart attack Father 64   Heart attack Paternal Grandmother 73   Cancer Maternal Uncle        melanoma    Colon cancer Paternal Uncle    Colon cancer Cousin    Atrial fibrillation Cousin    Allergies  Allergen Reactions   Lotensin [Benazepril Hcl] Swelling   Morphine Itching and Rash   Erythromycin Base Other (See Comments)    Unknown reaction   Hydrocodone Other (See Comments)    Unknown reaction     Other Swelling    Mustard - vaginal swelling/pain    Banana Swelling    vaginal swelling/pain   Chocolate Swelling    vaginal swelling/pain   Gluten Meal Diarrhea    Upset stomach    Tizanidine Other (See Comments)    Lowered blood  pressure    Wound Dressing Adhesive Other (See Comments)    Skin burns, blisters     OBJECTIVE: Blood pressure 133/73, pulse 87, temperature 98.9 F (37.2 C), temperature source Oral, resp. rate 15, weight 90.7 kg, SpO2 100 %.  Physical Exam Constitutional:      General: She is not in acute distress.    Appearance: Normal appearance. She is well-developed. She is not ill-appearing or diaphoretic.  HENT:     Head: Normocephalic and atraumatic.     Right Ear: Hearing and external ear normal.     Left Ear: Hearing and external ear normal.     Nose: No nasal deformity or rhinorrhea.  Eyes:     General: No scleral icterus.    Conjunctiva/sclera: Conjunctivae normal.     Right eye: Right conjunctiva is not injected.     Left eye: Left conjunctiva is not injected.     Pupils: Pupils are equal, round, and reactive to light.  Neck:     Vascular: No JVD.  Cardiovascular:     Rate and Rhythm: Normal rate and regular rhythm.     Heart sounds: Normal heart sounds, S1 normal and S2 normal. No murmur heard.   No friction rub. No gallop.  Pulmonary:     Effort: No respiratory distress.     Breath sounds: No stridor. No rhonchi.  Abdominal:     General: Bowel sounds are normal. There is no distension.     Palpations: Abdomen is  soft.     Tenderness: There is no abdominal tenderness.  Musculoskeletal:        General: Normal range of motion.     Right shoulder: Normal.     Left shoulder: Normal.     Cervical back: Normal range of motion and neck supple.     Right hip: Normal.     Left hip: Normal.     Right knee: Normal.     Left knee: Normal.  Lymphadenopathy:     Head:     Right side of head: No submandibular, preauricular or posterior auricular adenopathy.     Left side of head: No submandibular, preauricular or posterior auricular adenopathy.     Cervical: No cervical adenopathy.     Right cervical: No superficial or deep cervical adenopathy.    Left cervical: No superficial or deep cervical adenopathy.  Skin:    General: Skin is warm and dry.     Coloration: Skin is not pale.     Findings: No abrasion, bruising, ecchymosis, lesion or rash.     Nails: There is no clubbing.  Neurological:     General: No focal deficit present.     Mental Status: She is alert and oriented to person, place, and time.  Psychiatric:        Attention and Perception: She is attentive.        Mood and Affect: Mood normal.        Speech: Speech normal.        Behavior: Behavior normal. Behavior is cooperative.        Thought Content: Thought content normal.        Judgment: Judgment normal.   She has 2 cystic areas that are palpable and actually exquisitely tender to palpation on exam 1 on the right breast and 1 in the right axilla see picture October 13, 2021:     Lab Results Lab Results  Component Value Date   WBC 11.6 (H) 10/13/2021   HGB  10.7 (L) 10/13/2021   HCT 34.4 (L) 10/13/2021   MCV 90.5 10/13/2021   PLT 152 10/13/2021    Lab Results  Component Value Date   CREATININE 0.82 10/13/2021   BUN 11 10/13/2021   NA 135 10/13/2021   K 3.1 (L) 10/13/2021   CL 103 10/13/2021   CO2 22 10/13/2021    Lab Results  Component Value Date   ALT 19 10/13/2021   ALT 19 10/13/2021   AST 26 10/13/2021   AST 26  10/13/2021   ALKPHOS 49 10/13/2021   ALKPHOS 50 10/13/2021   BILITOT 0.8 10/13/2021   BILITOT 0.9 10/13/2021     Microbiology: Recent Results (from the past 240 hour(s))  Resp Panel by RT-PCR (Flu A&B, Covid) Nasopharyngeal Swab     Status: None   Collection Time: 10/12/21  2:13 PM   Specimen: Nasopharyngeal Swab; Nasopharyngeal(NP) swabs in vial transport medium  Result Value Ref Range Status   SARS Coronavirus 2 by RT PCR NEGATIVE NEGATIVE Final    Comment: (NOTE) SARS-CoV-2 target nucleic acids are NOT DETECTED.  The SARS-CoV-2 RNA is generally detectable in upper respiratory specimens during the acute phase of infection. The lowest concentration of SARS-CoV-2 viral copies this assay can detect is 138 copies/mL. A negative result does not preclude SARS-Cov-2 infection and should not be used as the sole basis for treatment or other patient management decisions. A negative result may occur with  improper specimen collection/handling, submission of specimen other than nasopharyngeal swab, presence of viral mutation(s) within the areas targeted by this assay, and inadequate number of viral copies(<138 copies/mL). A negative result must be combined with clinical observations, patient history, and epidemiological information. The expected result is Negative.  Fact Sheet for Patients:  EntrepreneurPulse.com.au  Fact Sheet for Healthcare Providers:  IncredibleEmployment.be  This test is no t yet approved or cleared by the [place] FDA and  has been authorized for detection and/or diagnosis of SARS-CoV-2 by FDA under an Emergency Use Authorization (EUA). This EUA will remain  in effect (meaning this test can be used) for the duration of the COVID-19 declaration under Section 564(b)(1) of the Act, 21 U.S.C.section 360bbb-3(b)(1), unless the authorization is terminated  or revoked sooner.       Influenza A by PCR NEGATIVE NEGATIVE Final    Influenza B by PCR NEGATIVE NEGATIVE Final    Comment: (NOTE) The Xpert Xpress SARS-CoV-2/FLU/RSV plus assay is intended as an aid in the diagnosis of influenza from Nasopharyngeal swab specimens and should not be used as a sole basis for treatment. Nasal washings and aspirates are unacceptable for Xpert Xpress SARS-CoV-2/FLU/RSV testing.  Fact Sheet for Patients: EntrepreneurPulse.com.au  Fact Sheet for Healthcare Providers: IncredibleEmployment.be  This test is not yet approved or cleared by the [place] FDA and has been authorized for detection and/or diagnosis of SARS-CoV-2 by FDA under an Emergency Use Authorization (EUA). This EUA will remain in effect (meaning this test can be used) for the duration of the COVID-19 declaration under Section 564(b)(1) of the Act, 21 U.S.C. section 360bbb-3(b)(1), unless the authorization is terminated or revoked.  Performed at [facility], [place] [address], [place], [place] [address]   Blood culture (routine x 2)     Status: None (Preliminary result)   Collection Time: 10/12/21  4:39 PM   Specimen: BLOOD LEFT HAND  Result Value Ref Range Status   Specimen Description BLOOD LEFT HAND  Final   Special Requests   Final  BOTTLES DRAWN AEROBIC AND ANAEROBIC Blood Culture results may not be optimal due to an inadequate volume of blood received in culture bottles   Culture   Final    NO GROWTH < 24 HOURS Performed at [facility] [address], [place], [place] [address]    Report Status PENDING  Incomplete    Alcide Evener, [facility] [phone] pager  10/13/2021, 5:08 PM

## 2021-10-13 NOTE — Progress Notes (Addendum)
PROGRESS NOTE  Kayla Bennett VQQ:[id number] DOB: August 16, 1951 DOA: 10/12/2021 PCP: Truitt Merle, MD  HPI/Recap of past 24 hours:  Kayla Bennett is a 70 y.o. female with medical history significant for chronic iron deficiency anemia Baseline hemoglobin 9-11, paroxysmal A. fib on Eliquis, type 2 diabetes, psoriasis stage I right-sided breast cancer diagnosed in January 2022, completed radiation therapy and is on tamoxifen, who was admitted to [facility] on 10/12/2021 with sepsis of unclear source after presenting from home to [facility] ED complaining of fever of 2 days duration.  Associated with generalized fatigue, weakness, and intermittent headache.  Work-up revealed sepsis of unknown source, fever with T-max 103.1, negative UA and negative chest x-ray.  ID consulted, pan CT ordered.  10/13/2021: Patient was seen and examined at bedside.  She has no new complaints.  Denies having any pain.  CT chest abdomen and pelvis with contrast showed fluid collection in the right axilla and posterior right breast question postoperative such as seroma, hematoma or lymphocele, infection not excluded by CT.  Assessment/Plan: Principal Problem:   Sepsis ([place]) Active Problems:   Malignant neoplasm of upper-outer quadrant of right breast in female, estrogen receptor positive (HCC)   Persistent atrial fibrillation (HCC)   Iron deficiency anemia   Fever   Nausea   Headache   Generalized weakness   Diabetes ([place])   Hypertension   GERD (gastroesophageal reflux disease)  Sepsis of unknown source Presented with 2 days of fever generalized fatigue and weakness T-max 103.1 in the ED. Respiration rate 30. UA and chest x-ray negative CT chest/abdomen pelvis with contrast revealed fluid collection in the right axilla and posterior right breast question postoperative such as seroma, hematoma or lymphocele, infection not excluded by CT. Currently on Rocephin empirically Received Rocephin on admission, DC'd  on 10/13/2021. Continue to follow blood cultures. Monitor fever curve and WBC. ID consulted to assist with the management  Stage I right breast cancer, follows with medical oncology Dr. Burr Medico Post radiation, currently on tamoxifen Will curbside with oncology on 10/14/2021.  Intermittent headache Follow MRI brain with and without contrast ordered by ID Continue symptomatic management  Paroxysmal A. fib on Eliquis Rate controlled on Paxil Continue Eliquis for CVA prevention  Chronic anxiety/depression Continue home Effexor  Type 2 diabetes with diabetic polyneuropathy As hemoglobin A1c 6.7 on 02/13/2021. Continues insulin sliding scale Continue gabapentin Avoid hypoglycemia  GERD Continue home PPI twice daily.     Code Status: Full code  Family Communication: None at bedside  Disposition Plan: Likely will discharge to home once infectious disease signs off.   Consultants: Infectious disease  Procedures: None  Antimicrobials: Rocephin IV vancomycin DC'd on 10/13/2021  DVT prophylaxis: Eliquis  Status is: Inpatient  Inpatient status.  Patient requires at least 2 midnights for further evaluation and treatment of present condition.        Objective: Vitals:   10/13/21 0200 10/13/21 0330 10/13/21 0752 10/13/21 1205  BP: 93/82 125/64 120/75 128/74  Pulse: (!) 103 99 94 82  Resp: 17 20 18 19   Temp:  (!) 101.2 F (38.4 C) 98.5 F (36.9 C) 98.6 F (37 C)  TempSrc:  Oral Axillary Oral  SpO2: 95% 94% 93% 98%  Weight:        Intake/Output Summary (Last 24 hours) at 10/13/2021 1617 Last data filed at 10/13/2021 0947 Gross per 24 hour  Intake 2335 ml  Output 1 ml  Net 2334 ml   Filed Weights   10/12/21 2000  Weight: 90.7 kg    Exam:  General: 70 y.o. year-old female well developed well nourished in no acute distress.  Alert and oriented x3. Cardiovascular: Regular rate and rhythm with no rubs or gallops.  No thyromegaly or JVD noted.    Respiratory: Clear to auscultation with no wheezes or rales. Good inspiratory effort. Abdomen: Soft nontender nondistended with normal bowel sounds x4 quadrants. Musculoskeletal: No lower extremity edema. 2/4 pulses in all 4 extremities. Skin: No ulcerative lesions noted or rashes, Psychiatry: Mood is appropriate for condition and setting   Data Reviewed: CBC: Recent Labs  Lab 10/12/21 1639 10/13/21 0548  WBC 11.7* 11.6*  NEUTROABS 8.5* 8.5*  HGB 11.3* 10.7*  HCT 36.1 34.4*  MCV 89.6 90.5  PLT 173 099   Basic Metabolic Panel: Recent Labs  Lab 10/12/21 1413 10/13/21 0548  NA 135 135  K 3.9 3.1*  CL 101 103  CO2 24 22  GLUCOSE 153* 174*  BUN 12 11  CREATININE 0.82 0.82  CALCIUM 8.8* 7.8*  MG  --  1.5*  1.5*   GFR: Estimated Creatinine Clearance: 66.8 mL/min (by C-G formula based on SCr of 0.82 mg/dL). Liver Function Tests: Recent Labs  Lab 10/13/21 0548  AST 26  26  ALT 19  19  ALKPHOS 49  50  BILITOT 0.8  0.9  PROT 6.1*  6.0*  ALBUMIN 2.8*  2.8*   No results for input(s): LIPASE, AMYLASE in the last 168 hours. No results for input(s): AMMONIA in the last 168 hours. Coagulation Profile: No results for input(s): INR, PROTIME in the last 168 hours. Cardiac Enzymes: No results for input(s): CKTOTAL, CKMB, CKMBINDEX, TROPONINI in the last 168 hours. BNP (last 3 results) No results for input(s): PROBNP in the last 8760 hours. HbA1C: No results for input(s): HGBA1C in the last 72 hours. CBG: Recent Labs  Lab 10/12/21 2147 10/13/21 1207  GLUCAP 142* 153*   Lipid Profile: No results for input(s): CHOL, HDL, LDLCALC, TRIG, CHOLHDL, LDLDIRECT in the last 72 hours. Thyroid Function Tests: Recent Labs    10/13/21 0602  TSH 2.875   Anemia Panel: No results for input(s): VITAMINB12, FOLATE, FERRITIN, TIBC, IRON, RETICCTPCT in the last 72 hours. Urine analysis:    Component Value Date/Time   COLORURINE YELLOW 10/12/2021 1946   APPEARANCEUR CLEAR  10/12/2021 1946   LABSPEC 1.008 10/12/2021 1946   PHURINE 6.0 10/12/2021 1946   GLUCOSEU NEGATIVE 10/12/2021 1946   HGBUR NEGATIVE 10/12/2021 1946   BILIRUBINUR NEGATIVE 10/12/2021 1946   KETONESUR NEGATIVE 10/12/2021 1946   PROTEINUR NEGATIVE 10/12/2021 1946   NITRITE NEGATIVE 10/12/2021 1946   LEUKOCYTESUR NEGATIVE 10/12/2021 1946   Sepsis Labs: @LABRCNTIP (procalcitonin:4,lacticidven:4)  ) Recent Results (from the past 240 hour(s))  Resp Panel by RT-PCR (Flu A&B, Covid) Nasopharyngeal Swab     Status: None   Collection Time: 10/12/21  2:13 PM   Specimen: Nasopharyngeal Swab; Nasopharyngeal(NP) swabs in vial transport medium  Result Value Ref Range Status   SARS Coronavirus 2 by RT PCR NEGATIVE NEGATIVE Final    Comment: (NOTE) SARS-CoV-2 target nucleic acids are NOT DETECTED.  The SARS-CoV-2 RNA is generally detectable in upper respiratory specimens during the acute phase of infection. The lowest concentration of SARS-CoV-2 viral copies this assay can detect is 138 copies/mL. A negative result does not preclude SARS-Cov-2 infection and should not be used as the sole basis for treatment or other patient management decisions. A negative result may occur with  improper specimen collection/handling, submission of specimen  other than nasopharyngeal swab, presence of viral mutation(s) within the areas targeted by this assay, and inadequate number of viral copies(<138 copies/mL). A negative result must be combined with clinical observations, patient history, and epidemiological information. The expected result is Negative.  Fact Sheet for Patients:  EntrepreneurPulse.com.au  Fact Sheet for Healthcare Providers:  IncredibleEmployment.be  This test is no t yet approved or cleared by the [place] FDA and  has been authorized for detection and/or diagnosis of SARS-CoV-2 by FDA under an Emergency Use Authorization (EUA). This EUA will remain   in effect (meaning this test can be used) for the duration of the COVID-19 declaration under Section 564(b)(1) of the Act, 21 U.S.C.section 360bbb-3(b)(1), unless the authorization is terminated  or revoked sooner.       Influenza A by PCR NEGATIVE NEGATIVE Final   Influenza B by PCR NEGATIVE NEGATIVE Final    Comment: (NOTE) The Xpert Xpress SARS-CoV-2/FLU/RSV plus assay is intended as an aid in the diagnosis of influenza from Nasopharyngeal swab specimens and should not be used as a sole basis for treatment. Nasal washings and aspirates are unacceptable for Xpert Xpress SARS-CoV-2/FLU/RSV testing.  Fact Sheet for Patients: EntrepreneurPulse.com.au  Fact Sheet for Healthcare Providers: IncredibleEmployment.be  This test is not yet approved or cleared by the [place] FDA and has been authorized for detection and/or diagnosis of SARS-CoV-2 by FDA under an Emergency Use Authorization (EUA). This EUA will remain in effect (meaning this test can be used) for the duration of the COVID-19 declaration under Section 564(b)(1) of the Act, 21 U.S.C. section 360bbb-3(b)(1), unless the authorization is terminated or revoked.  Performed at [facility], [place] [address], [place], [place] [address]   Blood culture (routine x 2)     Status: None (Preliminary result)   Collection Time: 10/12/21  4:39 PM   Specimen: BLOOD LEFT HAND  Result Value Ref Range Status   Specimen Description BLOOD LEFT HAND  Final   Special Requests   Final    BOTTLES DRAWN AEROBIC AND ANAEROBIC Blood Culture results may not be optimal due to an inadequate volume of blood received in culture bottles   Culture   Final    NO GROWTH < 24 HOURS Performed at [facility], [place] [address] [place],  [address]    Report Status PENDING  Incomplete      Studies: CT CHEST ABDOMEN PELVIS W CONTRAST  Result Date: 10/13/2021 CLINICAL DATA:  Chronic iron  deficiency anemia, paroxysmal atrial fibrillation, type II diabetes mellitus, sepsis of unknown source, past history breast cancer, hypertension, GERD EXAM: CT CHEST, ABDOMEN, AND PELVIS WITH CONTRAST TECHNIQUE: Multidetector CT imaging of the chest, abdomen and pelvis was performed following the standard protocol during bolus administration of intravenous contrast. CONTRAST:  165mL OMNIPAQUE IOHEXOL 300 MG/ML  SOLN COMPARISON:  CT abdomen and pelvis 08/14/2005 FINDINGS: CT CHEST FINDINGS Cardiovascular: Atherosclerotic calcifications aorta and coronary arteries. Aorta normal caliber. Pulmonary vascular structures grossly patent on non targeted exam. Heart unremarkable. No pericardial effusion. Mediastinum/Nodes: Esophagus normal appearance. Base of cervical region normal appearance. No thoracic adenopathy. Surgical clips RIGHT RIGHT axilla. Focal fluid collection RIGHT axilla 4.9 x 3.8 cm, may represent a postoperative collection such as hematoma, seroma or lymphocele, less likely necrotic lymph node. A second fluid collection is seen in the posterior RIGHT breast 2.9 x 2.7 cm. Lungs/Pleura: Tiny calcified granulomata at lateral RIGHT lung base. Lungs otherwise clear. No pulmonary infiltrate, pleural effusion, or pneumothorax. Musculoskeletal: Diffuse osseous  demineralization. CT ABDOMEN PELVIS FINDINGS Hepatobiliary: Gallbladder surgically absent. Liver normal appearance Pancreas: Normal appearance Spleen: Normal appearance. Small probable splenule anterior to spleen. Adrenals/Urinary Tract: Adrenal glands normal appearance. Kidneys, ureters, and bladder normal appearance Stomach/Bowel: Appendix not visualized, no pericecal inflammatory process seen. Small hiatal hernia. Stomach and bowel loops otherwise normal appearance. Vascular/Lymphatic: Atherosclerotic calcifications aorta and iliac arteries without aneurysm. Vascular structures patent. No adenopathy. Reproductive: Uterus surgically absent with  nonvisualization of ovaries Other: No free air or free fluid. No hernia or inflammatory process. Musculoskeletal: Mild osseous demineralization. IMPRESSION: No acute intrathoracic, intra-abdominal or intrapelvic abnormalities. Small hiatal hernia. Fluid collections in the RIGHT axilla and posterior RIGHT breast question postoperative such as seroma, hematoma, or lymphocele; infection not excluded by CT. Aortic Atherosclerosis (ICD10-I70.0). Electronically Signed   By: Lavonia Dana M.D.   On: 10/13/2021 12:38    Scheduled Meds:  apixaban  5 mg Oral BID   gabapentin  200 mg Oral QHS   insulin aspart  0-6 Units Subcutaneous TID WC   loratadine  10 mg Oral Daily   metoprolol succinate  25 mg Oral Daily   pantoprazole  40 mg Oral BID AC   potassium chloride  40 mEq Oral BID   tamoxifen  20 mg Oral Daily   venlafaxine XR  37.5 mg Oral QHS   venlafaxine XR  75 mg Oral QHS    Continuous Infusions:  cefTRIAXone (ROCEPHIN)  IV       LOS: 1 day     Kayleen Memos, MD [facility] Pager [phone]  If 7PM-7AM, please contact night-coverage [email or web address] Password [facility] 10/13/2021, 4:17 PM

## 2021-10-14 ENCOUNTER — Encounter (HOSPITAL_COMMUNITY): Payer: Self-pay | Admitting: Internal Medicine

## 2021-10-14 ENCOUNTER — Inpatient Hospital Stay (HOSPITAL_COMMUNITY): Payer: Medicare Other

## 2021-10-14 DIAGNOSIS — E119 Type 2 diabetes mellitus without complications: Secondary | ICD-10-CM | POA: Diagnosis not present

## 2021-10-14 DIAGNOSIS — R6520 Severe sepsis without septic shock: Secondary | ICD-10-CM | POA: Diagnosis not present

## 2021-10-14 DIAGNOSIS — D508 Other iron deficiency anemias: Secondary | ICD-10-CM

## 2021-10-14 DIAGNOSIS — A419 Sepsis, unspecified organism: Secondary | ICD-10-CM | POA: Diagnosis not present

## 2021-10-14 DIAGNOSIS — R519 Headache, unspecified: Secondary | ICD-10-CM | POA: Diagnosis not present

## 2021-10-14 DIAGNOSIS — L089 Local infection of the skin and subcutaneous tissue, unspecified: Secondary | ICD-10-CM

## 2021-10-14 DIAGNOSIS — G9340 Encephalopathy, unspecified: Secondary | ICD-10-CM | POA: Diagnosis not present

## 2021-10-14 DIAGNOSIS — C50411 Malignant neoplasm of upper-outer quadrant of right female breast: Secondary | ICD-10-CM | POA: Diagnosis not present

## 2021-10-14 DIAGNOSIS — R110 Nausea: Secondary | ICD-10-CM

## 2021-10-14 DIAGNOSIS — L729 Follicular cyst of the skin and subcutaneous tissue, unspecified: Secondary | ICD-10-CM

## 2021-10-14 LAB — COMPREHENSIVE METABOLIC PANEL
ALT: 24 U/L (ref 0–44)
AST: 23 U/L (ref 15–41)
Albumin: 2.7 g/dL — ABNORMAL LOW (ref 3.5–5.0)
Alkaline Phosphatase: 47 U/L (ref 38–126)
Anion gap: 7 (ref 5–15)
BUN: 8 mg/dL (ref 8–23)
CO2: 26 mmol/L (ref 22–32)
Calcium: 8 mg/dL — ABNORMAL LOW (ref 8.9–10.3)
Chloride: 105 mmol/L (ref 98–111)
Creatinine, Ser: 0.71 mg/dL (ref 0.44–1.00)
GFR, Estimated: >60 mL/min (ref >60)
Glucose, Bld: 137 mg/dL — ABNORMAL HIGH (ref 70–99)
Potassium: 3.7 mmol/L (ref 3.5–5.1)
Sodium: 138 mmol/L (ref 135–145)
Total Bilirubin: 0.4 mg/dL (ref 0.3–1.2)
Total Protein: 6.1 g/dL — ABNORMAL LOW (ref 6.5–8.1)

## 2021-10-14 LAB — MRSA NEXT GEN BY PCR, NASAL: MRSA by PCR Next Gen: NOT DETECTED

## 2021-10-14 LAB — CBC WITH DIFFERENTIAL/PLATELET
Abs Immature Granulocytes: 0.02 10*3/uL (ref 0.00–0.07)
Basophils Absolute: 0 10*3/uL (ref 0.0–0.1)
Basophils Relative: 0 %
Eosinophils Absolute: 0.1 10*3/uL (ref 0.0–0.5)
Eosinophils Relative: 2 %
HCT: 33.3 % — ABNORMAL LOW (ref 36.0–46.0)
Hemoglobin: 10.4 g/dL — ABNORMAL LOW (ref 12.0–15.0)
Immature Granulocytes: 0 %
Lymphocytes Relative: 20 %
Lymphs Abs: 1.5 10*3/uL (ref 0.7–4.0)
MCH: 28.5 pg (ref 26.0–34.0)
MCHC: 31.2 g/dL (ref 30.0–36.0)
MCV: 91.2 fL (ref 80.0–100.0)
Monocytes Absolute: 0.7 10*3/uL (ref 0.1–1.0)
Monocytes Relative: 9 %
Neutro Abs: 5.4 10*3/uL (ref 1.7–7.7)
Neutrophils Relative %: 69 %
Platelets: 154 10*3/uL (ref 150–400)
RBC: 3.65 MIL/uL — ABNORMAL LOW (ref 3.87–5.11)
RDW: 17.7 % — ABNORMAL HIGH (ref 11.5–15.5)
WBC: 7.8 10*3/uL (ref 4.0–10.5)
nRBC: 0 % (ref 0.0–0.2)

## 2021-10-14 LAB — METHYLMALONIC ACID, SERUM: Methylmalonic Acid, Quantitative: 209 nmol/L (ref 0–378)

## 2021-10-14 LAB — PHOSPHORUS: Phosphorus: 2.1 mg/dL — ABNORMAL LOW (ref 2.5–4.6)

## 2021-10-14 LAB — GLUCOSE, CAPILLARY
Glucose-Capillary: 134 mg/dL — ABNORMAL HIGH (ref 70–99)
Glucose-Capillary: 147 mg/dL — ABNORMAL HIGH (ref 70–99)
Glucose-Capillary: 135 mg/dL — ABNORMAL HIGH (ref 70–99)
Glucose-Capillary: 173 mg/dL — ABNORMAL HIGH (ref 70–99)

## 2021-10-14 LAB — MAGNESIUM: Magnesium: 2.1 mg/dL (ref 1.7–2.4)

## 2021-10-14 MED ORDER — LOSARTAN POTASSIUM 50 MG PO TABS
100.0000 mg | ORAL_TABLET | Freq: Every day | ORAL | Status: DC
  Administered 2021-10-15 – 2021-10-17 (×3): 100 mg via ORAL
  Filled 2021-10-14 (×3): qty 2

## 2021-10-14 MED ORDER — VANCOMYCIN HCL 1000 MG/200ML IV SOLN
1000.0000 mg | Freq: Every day | INTRAVENOUS | Status: DC
  Administered 2021-10-14 – 2021-10-16 (×3): 1000 mg via INTRAVENOUS
  Filled 2021-10-14 (×4): qty 200

## 2021-10-14 MED ORDER — HYDROCHLOROTHIAZIDE 25 MG PO TABS
25.0000 mg | ORAL_TABLET | Freq: Every day | ORAL | Status: DC
  Administered 2021-10-15 – 2021-10-17 (×3): 25 mg via ORAL
  Filled 2021-10-14 (×3): qty 1

## 2021-10-14 MED ORDER — LOSARTAN POTASSIUM-HCTZ 100-25 MG PO TABS: 1.0000 | ORAL_TABLET | Freq: Every day | ORAL | Status: DC

## 2021-10-14 MED ORDER — SODIUM CHLORIDE 0.9 % IV SOLN
510.0000 mg | Freq: Once | INTRAVENOUS | Status: AC
  Administered 2021-10-14: 17:00:00 510 mg via INTRAVENOUS
  Filled 2021-10-14: qty 17

## 2021-10-14 MED ORDER — POTASSIUM & SODIUM PHOSPHATES 280-160-250 MG PO PACK
1.0000 | PACK | Freq: Three times a day (TID) | ORAL | Status: AC
  Administered 2021-10-14 – 2021-10-16 (×12): 1 via ORAL
  Filled 2021-10-14 (×14): qty 1

## 2021-10-14 MED ORDER — LIDOCAINE HCL (PF) 1 % IJ SOLN
INTRAMUSCULAR | Status: AC
  Filled 2021-10-14: qty 30

## 2021-10-14 MED ORDER — SODIUM CHLORIDE 0.9 % IV SOLN
2.0000 g | INTRAVENOUS | Status: DC
  Administered 2021-10-14 – 2021-10-15 (×2): 2 g via INTRAVENOUS
  Filled 2021-10-14 (×2): qty 20

## 2021-10-14 NOTE — Consult Note (Signed)
Chief Complaint: Patient was seen in consultation today for image guided aspiration of a right breast cyst Chief Complaint  Patient presents with   Weakness   Headache   Fever   at the request of Irene Pap  Referring Physician(s): Irene Pap  Supervising Physician: Michaelle Birks  Patient Status: [facility] - In-pt  History of Present Illness: Kayla Bennett is a 70 y.o. female with PMHs of HTN, asthma, DM, breast CA s/p right breast lumpectomy with radioactive seed placement ans sentinel lymph node bx on 02/18/21 who is currently hospitalized due to sepsis.  Patient underwent CT CAP w on 11/26 which showed:  Fluid collections in the RIGHT axilla and posterior RIGHT breast question postoperative such as seroma, hematoma, or lymphocele; infection not excluded by CT.  IR was requested for image guided aspiration of the right breast fluid collection.  Case was reviewed and approved by Dr. Maryelizabeth Kaufmann, will utilize single agent sedation with local anesthesia as the fluid collection is superficial.   Patient laying in bed, not in acute distress.  Reports mild headache, states that it is nothing new. Also reports mild, occasional shortness of breath. Denise fever, chills, shortness cough, chest pain, abdominal pain, nausea ,vomiting, and bleeding.  Discussed utilizing single agent for sedation, pt states that her mom stopped breathing after she received Versed.  Informed the patient that we start at very low dose and increase dose slowly and will monitor her vital throughout the procedure to prevent any adverse effect.  Patient verbalized understanding, ok to proceed.   Past Medical History:  Diagnosis Date   Anemia    Arthritis    Asthma    Breast cancer ([place])    Diabetes ([place])    Dyspnea    Dysrhythmia    afibb on occasion   Family history of adverse reaction to anesthesia    mother had reaction to versed...during surgery, stopped breathing   Fibromyalgia    GERD  (gastroesophageal reflux disease)    Headache    History of hiatal hernia    Hypertension    Migraines    Osteopenia    Psoriasis     Past Surgical History:  Procedure Laterality Date   ABDOMINAL HYSTERECTOMY     APPENDECTOMY     BREAST LUMPECTOMY WITH RADIOACTIVE SEED AND SENTINEL LYMPH NODE BIOPSY Right 02/18/2021   Procedure: RIGHT BREAST LUMPECTOMY WITH RADIOACTIVE SEED AND SENTINEL LYMPH NODE BIOPSY;  Surgeon: [name], MD;  Location: [facility];  Service: General;  Laterality: Right;   broken wrist     CARDIOVERSION N/A 04/04/2021   Procedure: CARDIOVERSION;  Surgeon: Sueanne Margarita, MD;  Location: [facility];  Service: Cardiovascular;  Laterality: N/A;   CARDIOVERSION N/A 08/14/2021   Procedure: CARDIOVERSION;  Surgeon: Geralynn Rile, MD;  Location: [place];  Service: Cardiovascular;  Laterality: N/A;   CARDIOVERSION N/A 09/19/2021   Procedure: CARDIOVERSION;  Surgeon: Geralynn Rile, MD;  Location: [place];  Service: Cardiovascular;  Laterality: N/A;   [place]     right wrist   HYSTERECTOMY ABDOMINAL WITH SALPINGO-OOPHORECTOMY     LYSIS OF ADHESION     SHOULDER SURGERY Left    TUBAL LIGATION      Allergies: Lotensin [benazepril hcl], Morphine, Erythromycin base, Hydrocodone, Other, Banana, Chocolate, Gluten meal, Tizanidine, and Wound dressing adhesive  Medications: Prior to Admission medications   Medication Sig Start Date End Date Taking? Authorizing Provider  acetaminophen (TYLENOL) 500 MG tablet Take 500 mg by mouth every 6 (six) hours as needed for moderate pain, headache or mild pain.   Yes [provider]  albuterol (VENTOLIN HFA) 108 (90 Base) MCG/ACT inhaler Inhale 1-2 puffs into the lungs every 6 (six) hours as needed for shortness of breath or wheezing (Asthma).   Yes [provider]  apixaban (ELIQUIS) 5 MG TABS tablet Take 1 tablet (5 mg total) by mouth  2 (two) times daily. Pt needs 90 day supply.  Please keep on file till next refill. 07/10/21 07/10/22 Yes Weaver, [name] T, PA-C  calcipotriene (DOVONOX) 0.005 % cream Apply 1 application topically See admin instructions. Mix with fluorouracil 5% cream and apply topically twice daily for five days - as needed for precancerous on right cheek.   Yes [provider]  cetirizine (ZYRTEC) 10 MG tablet Take 10 mg by mouth in the morning.   Yes [provider]  cholecalciferol (VITAMIN D3) 25 MCG (1000 UNIT) tablet Take 1,000-2,000 Units by mouth See admin instructions. Take 1000 units in the morning and 2000 units at bedtime   Yes [provider]  ferrous sulfate 325 (65 FE) MG tablet Take 325 mg by mouth every morning.   Yes [provider]  fluorouracil (EFUDEX) 5 % cream Apply 1 application topically 2 (two) times daily as needed (precancerous spots on right cheek). Mix with calcipotriene 0.005% cream and apply topically twice daily for five days - as needed for precancerous on right cheek.   Yes [provider]  gabapentin (NEURONTIN) 100 MG capsule Take 200 mg by mouth at bedtime.   Yes [provider]  ibuprofen (ADVIL) 200 MG tablet Take 400 mg by mouth every 4 (four) hours as needed for moderate pain or headache.   Yes [provider]  losartan-hydrochlorothiazide (HYZAAR) 100-25 MG tablet Take 1 tablet by mouth daily. 09/27/21  Yes Fenton, Clint R, PA  metFORMIN (GLUCOPHAGE-XR) 500 MG 24 hr tablet Take 500 mg by mouth daily with supper. 10/24/20  Yes [provider]  metoprolol succinate (TOPROL XL) 25 MG 24 hr tablet Take 1 tablet (25 mg total) by mouth daily. 08/09/21  Yes Fenton, Clint R, PA  pantoprazole (PROTONIX) 40 MG tablet Take 40 mg by mouth 2 (two) times daily before a meal.   Yes [provider]  potassium chloride SA (KLOR-CON M20) 20 MEQ tablet Take 1 tablet (20 mEq total) by mouth daily. 07/26/21  Yes Fenton,  Clint R, PA  Prenatal Vit-Fe Fumarate-FA (PRENATAL VITAMIN PLUS LOW IRON PO) Take 1 tablet by mouth in the morning.   Yes [provider]  SUMAtriptan (IMITREX) 100 MG tablet Take 100 mg by mouth See admin instructions. Take one tablet (100 mg) by mouth at onset of migraine headache, may repeat in 2 hours if headache persists or recurs.   Yes [provider]  tamoxifen (NOLVADEX) 20 MG tablet TAKE 1 TABLET(20 MG) BY MOUTH DAILY Patient taking differently: Take 20 mg by mouth every morning. 09/08/21  Yes Truitt Merle, MD  triamcinolone (KENALOG) 0.1 % Apply 1 application topically 2 (two) times daily as needed (psoriasis). Mixed with Cetaphil 1:1   Yes [provider]  venlafaxine XR (EFFEXOR-XR) 37.5 MG 24 hr capsule Take 37.5 mg by mouth at bedtime. Take 1 capsule (75 mg) + 1 capsule (37.5 mg) to equal 112.5 mg at bedtime 02/07/21  Yes [provider]  venlafaxine XR (EFFEXOR-XR) 75 MG 24 hr capsule Take 75  mg by mouth at bedtime. Take 1 capsule (75 mg) + 1 capsule (37.5 mg) to equal 112.5 mg  at bedtime 09/29/20  Yes [provider]  vitamin B-12 (CYANOCOBALAMIN) 1000 MCG tablet Take 1,000 mcg by mouth every morning.   Yes [provider]  vitamin C (ASCORBIC ACID) 500 MG tablet Take 500 mg by mouth daily.   Yes [provider]     Family History  Problem Relation Age of Onset   Atrial fibrillation Mother    Heart attack Mother 6   Colon cancer Father 40   Heart attack Father 2   Heart attack Paternal Grandmother 20   Cancer Maternal Uncle        melanoma    Colon cancer Paternal Uncle    Colon cancer Cousin    Atrial fibrillation Cousin     Social History   Socioeconomic History   Marital status: Married    Spouse name: Not on file   Number of children: 2   Years of education: Not on file   Highest education level: Not on file  Occupational History   Not on file  Tobacco Use   Smoking status: Never   Smokeless  tobacco: Never  Vaping Use   Vaping Use: Never used  Substance and Sexual Activity   Alcohol use: Never   Drug use: Never   Sexual activity: Not on file  Other Topics Concern   Not on file  Social History Narrative   Not on file   Social Determinants of Health   Financial Resource Strain: Not on file  Food Insecurity: Not on file  Transportation Needs: Not on file  Physical Activity: Not on file  Stress: Not on file  Social Connections: Not on file     Review of Systems: A 12 point ROS discussed and pertinent positives are indicated in the HPI above.  All other systems are negative.  Vital Signs: BP (!) 148/80 (BP Location: Left Arm)   Pulse 89   Temp 98.4 F (36.9 C) (Oral)   Resp 16   Wt 205 lb 4.8 oz (93.1 kg)   SpO2 92%   BMI 37.55 kg/m    Physical Exam Vitals and nursing note reviewed.  Constitutional:      General: Patient is not in acute distress.    Appearance: Normal appearance. Patient is not ill-appearing.  HENT:     Head: Normocephalic and atraumatic.     Mouth/Throat:     Mouth: Mucous membranes are moist.     Pharynx: Oropharynx is clear.  Cardiovascular:     Rate and Rhythm: Normal rate and regular rhythm.     Pulses: Normal pulses.     Heart sounds: Normal heart sounds.  Pulmonary:     Effort: Pulmonary effort is normal.     Breath sounds: Normal breath sounds.  Abdominal:     General: Abdomen is flat. Bowel sounds are normal.     Palpations: Abdomen is soft.  Musculoskeletal:     Cervical back: Neck supple.  Skin:    General: Skin is warm and dry.     Coloration: Skin is not jaundiced or pale.  Neurological:     Mental Status: Patient is alert and oriented to person, place, and time.  Psychiatric:        Mood and Affect: Mood normal.        Behavior: Behavior normal.        Judgment: Judgment normal.    MD Evaluation  Airway: WNL Heart: WNL Abdomen: WNL Chest/ Lungs: WNL ASA  Classification: 2 Mallampati/Airway Score:  One  Imaging: DG Chest 2 View  Result Date: 10/12/2021 CLINICAL DATA:  Chest pain, shortness of breath dizziness EXAM: CHEST - 2 VIEW COMPARISON:  02/04/2008 FINDINGS: Cardiac size is within normal limits. There are no signs of pulmonary edema or focal pulmonary consolidation. There are small linear densities in the left lower lung fields suggesting subsegmental atelectasis or scarring. There is no pleural effusion or pneumothorax. There is possible 3 mm calcific density in the left mid lung fields. Surgical clips are seen in the right axilla. IMPRESSION: There are no signs of pulmonary edema or focal pulmonary consolidation. Small transverse linear densities in the left lower lung fields suggest scarring or subsegmental atelectasis. Electronically Signed   By: Elmer Picker M.D.   On: 10/12/2021 14:53   MR BRAIN W WO CONTRAST  Result Date: 10/13/2021 CLINICAL DATA:  Headache, new or worsening EXAM: MRI HEAD WITHOUT AND WITH CONTRAST TECHNIQUE: Multiplanar, multiecho pulse sequences of the brain and surrounding structures were obtained without and with intravenous contrast. CONTRAST:  43mL GADAVIST GADOBUTROL 1 MMOL/ML IV SOLN COMPARISON:  None. FINDINGS: Brain: No restricted diffusion to suggest acute or subacute infarct. No acute hemorrhage, hydrocephalus, extra-axial collection, or mass lesion. The ventricles and sulci are within normal limits for age. No abnormal enhancement. Vascular: Normal flow voids. Skull and upper cervical spine: Normal marrow signal. Sinuses/Orbits: Mucosal thickening in the right maxillary sinus. The orbits are unremarkable. Other: Trace fluid in left mastoid air cells. IMPRESSION: No acute intracranial process. No etiology is seen for the patient's headache. Electronically Signed   By: Merilyn Baba M.D.   On: 10/13/2021 20:46   CT CHEST ABDOMEN PELVIS W CONTRAST  Result Date: 10/13/2021 CLINICAL DATA:  Chronic iron deficiency anemia, paroxysmal atrial fibrillation,  type II diabetes mellitus, sepsis of unknown source, past history breast cancer, hypertension, GERD EXAM: CT CHEST, ABDOMEN, AND PELVIS WITH CONTRAST TECHNIQUE: Multidetector CT imaging of the chest, abdomen and pelvis was performed following the standard protocol during bolus administration of intravenous contrast. CONTRAST:  134mL OMNIPAQUE IOHEXOL 300 MG/ML  SOLN COMPARISON:  CT abdomen and pelvis 08/14/2005 FINDINGS: CT CHEST FINDINGS Cardiovascular: Atherosclerotic calcifications aorta and coronary arteries. Aorta normal caliber. Pulmonary vascular structures grossly patent on non targeted exam. Heart unremarkable. No pericardial effusion. Mediastinum/Nodes: Esophagus normal appearance. Base of cervical region normal appearance. No thoracic adenopathy. Surgical clips RIGHT RIGHT axilla. Focal fluid collection RIGHT axilla 4.9 x 3.8 cm, may represent a postoperative collection such as hematoma, seroma or lymphocele, less likely necrotic lymph node. A second fluid collection is seen in the posterior RIGHT breast 2.9 x 2.7 cm. Lungs/Pleura: Tiny calcified granulomata at lateral RIGHT lung base. Lungs otherwise clear. No pulmonary infiltrate, pleural effusion, or pneumothorax. Musculoskeletal: Diffuse osseous demineralization. CT ABDOMEN PELVIS FINDINGS Hepatobiliary: Gallbladder surgically absent. Liver normal appearance Pancreas: Normal appearance Spleen: Normal appearance. Small probable splenule anterior to spleen. Adrenals/Urinary Tract: Adrenal glands normal appearance. Kidneys, ureters, and bladder normal appearance Stomach/Bowel: Appendix not visualized, no pericecal inflammatory process seen. Small hiatal hernia. Stomach and bowel loops otherwise normal appearance. Vascular/Lymphatic: Atherosclerotic calcifications aorta and iliac arteries without aneurysm. Vascular structures patent. No adenopathy. Reproductive: Uterus surgically absent with nonvisualization of ovaries Other: No free air or free fluid. No  hernia or inflammatory process. Musculoskeletal: Mild osseous demineralization. IMPRESSION: No acute intrathoracic, intra-abdominal or intrapelvic abnormalities. Small hiatal hernia. Fluid collections in the RIGHT axilla and posterior RIGHT breast  question postoperative such as seroma, hematoma, or lymphocele; infection not excluded by CT. Aortic Atherosclerosis (ICD10-I70.0). Electronically Signed   By: Lavonia Dana M.D.   On: 10/13/2021 12:38    Labs:  CBC: Recent Labs    09/09/21 1410 10/12/21 1639 10/13/21 0548 10/14/21 0219  WBC 6.2 11.7* 11.6* 7.8  HGB 9.7* 11.3* 10.7* 10.4*  HCT 31.0* 36.1 34.4* 33.3*  PLT 240 173 152 154    COAGS: No results for input(s): INR, APTT in the last 8760 hours.  BMP: Recent Labs    09/09/21 1410 10/12/21 1413 10/13/21 0548 10/14/21 0219  NA 135 135 135 138  K 4.0 3.9 3.1* 3.7  CL 101 101 103 105  CO2 28 24 22 26   GLUCOSE 110* 153* 174* 137*  BUN 16 12 11 8   CALCIUM 8.7* 8.8* 7.8* 8.0*  CREATININE 0.83 0.82 0.82 0.71  GFRNONAA >60 >60 >60 >60    LIVER FUNCTION TESTS: Recent Labs    09/02/21 1235 09/09/21 1410 10/13/21 0548 10/14/21 0219  BILITOT 0.7 0.9 0.8  0.9 0.4  AST 25 33 26  26 23   ALT 19 22 19  19 24   ALKPHOS 56 57 49  50 47  PROT 7.2 7.0 6.1*  6.0* 6.1*  ALBUMIN 3.3* 3.4* 2.8*  2.8* 2.7*    TUMOR MARKERS: No results for input(s): AFPTM, CEA, CA199, CHROMGRNA in the last 8760 hours.  Assessment and Plan: 70 y.o. female with hx right breast CA s/p lumpectomy on 02/18/21, currently hospitalized due to sepsis,   right breast fluid collection seen on recent CT.   IR was requested for image guided aspiration of the right breast fluid collection.  Case was reviewed and approved by Dr. Maryelizabeth Kaufmann, will utilize single agent sedation with local anesthesia as the fluid collection is superficial.   No need for NPO VS hypertension but stable  CBC chronic anemia, plt normal  On Eliquis, no need for d/c   Risks and  benefits discussed with the patient including bleeding, infection, damage to adjacent structures, and sepsis.  All of the patient's questions were answered, patient is agreeable to proceed. Consent signed and in chart.   Thank you for this interesting consult.  I greatly enjoyed meeting Kayla Bennett and look forward to participating in their care.  A copy of this report was sent to the requesting provider on this date.  Electronically Signed: Tera Mater, PA-C 10/14/2021, 10:21 AM   I spent a total of 20 Minutes    in face to face in clinical consultation, greater than 50% of which was counseling/coordinating care for right breast fluid collection aspiration.   This chart was dictated using voice recognition software.  Despite best efforts to proofread,  errors can occur which can change the documentation meaning.

## 2021-10-14 NOTE — Progress Notes (Signed)
Subjective: No new complaints   Antibiotics:  Anti-infectives (From admission, onward)    Start     Dose/Rate Route Frequency Ordered Stop   10/14/21 1800  cefTRIAXone (ROCEPHIN) 2 g in sodium chloride 0.9 % 100 mL IVPB        2 g 200 mL/hr over 30 Minutes Intravenous Every 24 hours 10/14/21 1014     10/14/21 0700  vancomycin (VANCOREADY) IVPB 1000 mg/200 mL        1,000 mg 200 mL/hr over 60 Minutes Intravenous Daily 10/14/21 0612     10/13/21 2300  vancomycin (VANCOREADY) IVPB 1000 mg/200 mL  Status:  Discontinued        1,000 mg 200 mL/hr over 60 Minutes Intravenous Every 24 hours 10/12/21 2025 10/13/21 1022   10/13/21 1800  cefTRIAXone (ROCEPHIN) 1 g in sodium chloride 0.9 % 100 mL IVPB  Status:  Discontinued        1 g 200 mL/hr over 30 Minutes Intravenous Every 24 hours 10/12/21 1947 10/14/21 1014   10/12/21 2015  vancomycin (VANCOREADY) IVPB 1750 mg/350 mL        1,750 mg 175 mL/hr over 120 Minutes Intravenous  Once 10/12/21 2012 10/12/21 2325   10/12/21 1900  cefTRIAXone (ROCEPHIN) 2 g in sodium chloride 0.9 % 100 mL IVPB        2 g 200 mL/hr over 30 Minutes Intravenous  Once 10/12/21 1859 10/12/21 1930   10/12/21 1900  fluconazole (DIFLUCAN) tablet 200 mg        200 mg Oral  Once 10/12/21 1859 10/12/21 1945       Medications: Scheduled Meds:  apixaban  5 mg Oral BID   gabapentin  200 mg Oral QHS   losartan  100 mg Oral Daily   And   hydrochlorothiazide  25 mg Oral Daily   insulin aspart  0-6 Units Subcutaneous TID WC   lidocaine (PF)       loratadine  10 mg Oral Daily   metoprolol succinate  25 mg Oral Daily   pantoprazole  40 mg Oral BID AC   potassium & sodium phosphates  1 packet Oral TID WC & HS   senna-docusate  2 tablet Oral Q0600   tamoxifen  20 mg Oral Daily   venlafaxine XR  37.5 mg Oral QHS   venlafaxine XR  75 mg Oral QHS   Continuous Infusions:  cefTRIAXone (ROCEPHIN)  IV     lactated ringers 50 mL/hr at 10/14/21 1418   vancomycin  1,000 mg (10/14/21 0944)   PRN Meds:.acetaminophen **OR** acetaminophen, albuterol, HYDROcodone-acetaminophen, ketorolac, ondansetron (ZOFRAN) IV    Objective: Weight change: 2.423 kg  Intake/Output Summary (Last 24 hours) at 10/14/2021 1507 Last data filed at 10/14/2021 0900 Gross per 24 hour  Intake 760.5 ml  Output --  Net 760.5 ml   Blood pressure 122/67, pulse 78, temperature 98.4 F (36.9 C), temperature source Oral, resp. rate 16, weight 93.1 kg, SpO2 98 %. Temp:  [98.4 F (36.9 C)-99 F (37.2 C)] 98.4 F (36.9 C) (11/28 1200) Pulse Rate:  [78-96] 78 (11/28 1200) Resp:  [14-21] 16 (11/28 1200) BP: (121-148)/(67-86) 122/67 (11/28 1200) SpO2:  [92 %-100 %] 98 % (11/28 1200) Weight:  [93.1 kg] 93.1 kg (11/28 0413)  Physical Exam: Physical Exam Constitutional:      General: She is not in acute distress.    Appearance: She is well-developed. She is obese. She is not diaphoretic.  HENT:  Head: Normocephalic and atraumatic.     Right Ear: External ear normal.     Left Ear: External ear normal.     Mouth/Throat:     Pharynx: No oropharyngeal exudate.  Eyes:     General: No scleral icterus.    Conjunctiva/sclera: Conjunctivae normal.  Cardiovascular:     Rate and Rhythm: Normal rate and regular rhythm.     Heart sounds: Normal heart sounds. No murmur heard.   No friction rub. No gallop.  Pulmonary:     Effort: Pulmonary effort is normal. No respiratory distress.     Breath sounds: Normal breath sounds. No wheezing or rales.  Abdominal:     General: Bowel sounds are normal. There is no distension.     Palpations: Abdomen is soft.     Tenderness: There is no abdominal tenderness. There is no rebound.  Musculoskeletal:        General: No tenderness. Normal range of motion.  Lymphadenopathy:     Cervical: No cervical adenopathy.  Skin:    General: Skin is warm and dry.     Coloration: Skin is not pale.     Findings: No erythema or rash.  Neurological:      General: No focal deficit present.     Mental Status: She is alert and oriented to person, place, and time.     Motor: No abnormal muscle tone.     Coordination: Coordination normal.  Psychiatric:        Mood and Affect: Mood normal. Mood is not anxious or depressed.        Speech: Speech normal.        Behavior: Behavior normal. Behavior is not agitated.        Thought Content: Thought content normal.        Cognition and Memory: Cognition is not impaired. Memory is not impaired.        Judgment: Judgment normal.    She is still having pain at her axillary and lateral cystic breast sites  CBC:    BMET Recent Labs    10/13/21 0548 10/14/21 0219  NA 135 138  K 3.1* 3.7  CL 103 105  CO2 22 26  GLUCOSE 174* 137*  BUN 11 8  CREATININE 0.82 0.71  CALCIUM 7.8* 8.0*     Liver Panel  Recent Labs    10/13/21 0548 10/14/21 0219  PROT 6.1*  6.0* 6.1*  ALBUMIN 2.8*  2.8* 2.7*  AST 26  26 23   ALT 19  19 24   ALKPHOS 49  50 47  BILITOT 0.8  0.9 0.4  BILIDIR 0.2  --   IBILI 0.7  --        Sedimentation Rate Recent Labs    10/13/21 0548  ESRSEDRATE 40*   C-Reactive Protein Recent Labs    10/13/21 0548  CRP 18.5*    Micro Results: Recent Results (from the past 720 hour(s))  Resp Panel by RT-PCR (Flu A&B, Covid) Nasopharyngeal Swab     Status: None   Collection Time: 10/12/21  2:13 PM   Specimen: Nasopharyngeal Swab; Nasopharyngeal(NP) swabs in vial transport medium  Result Value Ref Range Status   SARS Coronavirus 2 by RT PCR NEGATIVE NEGATIVE Final    Comment: (NOTE) SARS-CoV-2 target nucleic acids are NOT DETECTED.  The SARS-CoV-2 RNA is generally detectable in upper respiratory specimens during the acute phase of infection. The lowest concentration of SARS-CoV-2 viral copies this assay can detect is 138 copies/mL. A negative result  does not preclude SARS-Cov-2 infection and should not be used as the sole basis for treatment or other patient  management decisions. A negative result may occur with  improper specimen collection/handling, submission of specimen other than nasopharyngeal swab, presence of viral mutation(s) within the areas targeted by this assay, and inadequate number of viral copies(<138 copies/mL). A negative result must be combined with clinical observations, patient history, and epidemiological information. The expected result is Negative.  Fact Sheet for Patients:  EntrepreneurPulse.com.au  Fact Sheet for Healthcare Providers:  IncredibleEmployment.be  This test is no t yet approved or cleared by the [place] FDA and  has been authorized for detection and/or diagnosis of SARS-CoV-2 by FDA under an Emergency Use Authorization (EUA). This EUA will remain  in effect (meaning this test can be used) for the duration of the COVID-19 declaration under Section 564(b)(1) of the Act, 21 U.S.C.section 360bbb-3(b)(1), unless the authorization is terminated  or revoked sooner.       Influenza A by PCR NEGATIVE NEGATIVE Final   Influenza B by PCR NEGATIVE NEGATIVE Final    Comment: (NOTE) The Xpert Xpress SARS-CoV-2/FLU/RSV plus assay is intended as an aid in the diagnosis of influenza from Nasopharyngeal swab specimens and should not be used as a sole basis for treatment. Nasal washings and aspirates are unacceptable for Xpert Xpress SARS-CoV-2/FLU/RSV testing.  Fact Sheet for Patients: EntrepreneurPulse.com.au  Fact Sheet for Healthcare Providers: IncredibleEmployment.be  This test is not yet approved or cleared by the [place] FDA and has been authorized for detection and/or diagnosis of SARS-CoV-2 by FDA under an Emergency Use Authorization (EUA). This EUA will remain in effect (meaning this test can be used) for the duration of the COVID-19 declaration under Section 564(b)(1) of the Act, 21 U.S.C. section 360bbb-3(b)(1),  unless the authorization is terminated or revoked.  Performed at [facility], [place] [address], [place], [place] [address]   Blood culture (routine x 2)     Status: None (Preliminary result)   Collection Time: 10/12/21  4:39 PM   Specimen: BLOOD LEFT HAND  Result Value Ref Range Status   Specimen Description BLOOD LEFT HAND  Final   Special Requests   Final    BOTTLES DRAWN AEROBIC AND ANAEROBIC Blood Culture results may not be optimal due to an inadequate volume of blood received in culture bottles   Culture   Final    NO GROWTH 2 DAYS Performed at [facility], [place] [address], [place], [place] [address]    Report Status PENDING  Incomplete  Blood culture (routine x 2)     Status: None (Preliminary result)   Collection Time: 10/13/21  5:48 AM   Specimen: BLOOD LEFT HAND  Result Value Ref Range Status   Specimen Description BLOOD LEFT HAND  Final   Special Requests AEROBIC BOTTLE ONLY Blood Culture adequate volume  Final   Culture   Final    NO GROWTH 1 DAY Performed at [facility], [place] [address], [place], [place] [address]    Report Status PENDING  Incomplete  MRSA Next Gen by PCR, Nasal     Status: None   Collection Time: 10/14/21  9:24 AM   Specimen: Nasal Mucosa; Nasal Swab  Result Value Ref Range Status   MRSA by PCR Next Gen NOT DETECTED NOT DETECTED Final    Comment: (NOTE) The GeneXpert MRSA Assay (FDA approved for NASAL specimens only), is one component of a comprehensive MRSA colonization surveillance program.  It is not intended to diagnose MRSA infection nor to guide or monitor treatment for MRSA infections. Test performance is not FDA approved in patients less than 62 years old. Performed at [facility], [place] [address], [place], [place] [address]     Studies/Results: MR BRAIN W WO CONTRAST  Result Date: 10/13/2021 CLINICAL DATA:  Headache, new or worsening EXAM: MRI HEAD WITHOUT AND WITH CONTRAST TECHNIQUE: Multiplanar, multiecho  pulse sequences of the brain and surrounding structures were obtained without and with intravenous contrast. CONTRAST:  60mL GADAVIST GADOBUTROL 1 MMOL/ML IV SOLN COMPARISON:  None. FINDINGS: Brain: No restricted diffusion to suggest acute or subacute infarct. No acute hemorrhage, hydrocephalus, extra-axial collection, or mass lesion. The ventricles and sulci are within normal limits for age. No abnormal enhancement. Vascular: Normal flow voids. Skull and upper cervical spine: Normal marrow signal. Sinuses/Orbits: Mucosal thickening in the right maxillary sinus. The orbits are unremarkable. Other: Trace fluid in left mastoid air cells. IMPRESSION: No acute intracranial process. No etiology is seen for the patient's headache. Electronically Signed   By: Merilyn Baba M.D.   On: 10/13/2021 20:46   CT CHEST ABDOMEN PELVIS W CONTRAST  Result Date: 10/13/2021 CLINICAL DATA:  Chronic iron deficiency anemia, paroxysmal atrial fibrillation, type II diabetes mellitus, sepsis of unknown source, past history breast cancer, hypertension, GERD EXAM: CT CHEST, ABDOMEN, AND PELVIS WITH CONTRAST TECHNIQUE: Multidetector CT imaging of the chest, abdomen and pelvis was performed following the standard protocol during bolus administration of intravenous contrast. CONTRAST:  150mL OMNIPAQUE IOHEXOL 300 MG/ML  SOLN COMPARISON:  CT abdomen and pelvis 08/14/2005 FINDINGS: CT CHEST FINDINGS Cardiovascular: Atherosclerotic calcifications aorta and coronary arteries. Aorta normal caliber. Pulmonary vascular structures grossly patent on non targeted exam. Heart unremarkable. No pericardial effusion. Mediastinum/Nodes: Esophagus normal appearance. Base of cervical region normal appearance. No thoracic adenopathy. Surgical clips RIGHT RIGHT axilla. Focal fluid collection RIGHT axilla 4.9 x 3.8 cm, may represent a postoperative collection such as hematoma, seroma or lymphocele, less likely necrotic lymph node. A second fluid collection is  seen in the posterior RIGHT breast 2.9 x 2.7 cm. Lungs/Pleura: Tiny calcified granulomata at lateral RIGHT lung base. Lungs otherwise clear. No pulmonary infiltrate, pleural effusion, or pneumothorax. Musculoskeletal: Diffuse osseous demineralization. CT ABDOMEN PELVIS FINDINGS Hepatobiliary: Gallbladder surgically absent. Liver normal appearance Pancreas: Normal appearance Spleen: Normal appearance. Small probable splenule anterior to spleen. Adrenals/Urinary Tract: Adrenal glands normal appearance. Kidneys, ureters, and bladder normal appearance Stomach/Bowel: Appendix not visualized, no pericecal inflammatory process seen. Small hiatal hernia. Stomach and bowel loops otherwise normal appearance. Vascular/Lymphatic: Atherosclerotic calcifications aorta and iliac arteries without aneurysm. Vascular structures patent. No adenopathy. Reproductive: Uterus surgically absent with nonvisualization of ovaries Other: No free air or free fluid. No hernia or inflammatory process. Musculoskeletal: Mild osseous demineralization. IMPRESSION: No acute intrathoracic, intra-abdominal or intrapelvic abnormalities. Small hiatal hernia. Fluid collections in the RIGHT axilla and posterior RIGHT breast question postoperative such as seroma, hematoma, or lymphocele; infection not excluded by CT. Aortic Atherosclerosis (ICD10-I70.0). Electronically Signed   By: Lavonia Dana M.D.   On: 10/13/2021 12:38      Assessment/Plan:  INTERVAL HISTORY: MRI of the brain did not show any intracranial abnormalities or evidence of meningitis   Principal Problem:   Sepsis ([place]) Active Problems:   Malignant neoplasm of upper-outer quadrant of right breast in female, estrogen receptor positive ([place])   Persistent atrial fibrillation (HCC)   Iron deficiency anemia   Fever   Nausea   Headache  Generalized weakness   Diabetes (HCC)   Hypertension   GERD (gastroesophageal reflux disease)    Kayla Bennett is a 70 y.o. female with  history of breast cancer status postlumpectomy and axillary lymph node dissection radiation who had recent cardioversion for very difficult to treat atrial fibrillation.  She then developed fevers with a severe headache chills numbness in the tips of the fingers myalgias and was admitted to the hospital with apparent sepsis and encephalopathy.  She had had some neck stiffness as well.  When I talked to her and examined her yesterday she had exquisite pain and tenderness at the 2 cystic sites from where she had had surgery 1 in her axilla and 1 involving her breast tissue.  I have suspected that her source of infection lies in these cysts that have become acutely inflamed and tender.  We obtained an MRI of the brain to ensure there was no evidence of meningitis or encephalitis and there is none.  Will continue ceftriaxone and vancomycin pending culture data from IR guided aspirate which was performed today.  I spent 60minutes with the patient including  face to face counseling of the patient regarding work-up for fever and sepsis, person reviewing MRI of the brain along with updated culture data CBC CMP along with  review of medical records in preparation for the visit and during the visit and in coordination of her care.    LOS: 2 days   Alcide Evener 10/14/2021, 3:07 PM

## 2021-10-14 NOTE — Procedures (Signed)
Vascular and Interventional Radiology Procedure Note  Patient: Kayla Bennett DOB: 1951/06/26 Medical Record Number: [id number] Note Date/Time: 10/14/21 11:57 AM   Performing Physician: Michaelle Birks, MD Assistant(s): None  Diagnosis: Right Breast / Axilla collection  Procedure: ASPIRATION OF RIGHT AXILLARY COLLECTION  Anesthesia: Local Anesthetic Complications: None Estimated Blood Loss: Minimal Specimens: Sent for Gram Stain, Aerobe Culture, and Anerobe Culture  Findings:  Successful Ultrasound-guided ASPIRATION of RIGHT axillary fluid collection.   See detailed procedure note with images in PACS. The patient tolerated the procedure well without incident or complication and was returned to Floor Bed in stable condition.    Michaelle Birks, MD [facility] Interventional [facility]iology [facility]   Pager. [place]

## 2021-10-14 NOTE — Progress Notes (Signed)
Physical Therapy Treatment Patient Details Name: Kayla Bennett MRN: [id number] DOB: 09/14/1951 Today's Date: 10/14/2021   History of Present Illness 70 y.o. female who is admitted to [facility] on 10/12/2021 with sepsis of unclear source. PMH significant for chronic iron deficiency anemia, paroxysmal atrial fibrillation chronically anticoagulated on Eliquis, type 2 diabetes mellitus, Rt breast Cancer 11/2020    PT Comments    Pt progressing towards goals, however, remains mildly unsteady. Min guard for gait training this session. Required standing rest X3 secondary to fatigue. Reviewed standing balance HEP with pt. Current recommendations appropriate. Will continue to follow acutely.   Medbridge HEP: [id number]    Recommendations for follow up therapy are one component of a multi-disciplinary discharge planning process, led by the attending physician.  Recommendations may be updated based on patient status, additional functional criteria and insurance authorization.  Follow Up Recommendations  Outpatient PT     Assistance Recommended at Discharge None  Equipment Recommendations  None recommended by PT    Recommendations for Other Services       Precautions / Restrictions Precautions Precautions: Fall Precaution Comments: reports recent decr balance with near fall x 1 Restrictions Weight Bearing Restrictions: No     Mobility  Bed Mobility Overal bed mobility: Modified Independent                  Transfers Overall transfer level: Modified independent Equipment used: None                    Ambulation/Gait Ambulation/Gait assistance: Min guard Gait Distance (Feet): 160 Feet Assistive device: None Gait Pattern/deviations: Step-through pattern;Decreased stride length Gait velocity: decr     General Gait Details: Mild LOB noted X2, but pt able to self recover. Required standing rests X 3-4 secondary to fatigue.   Stairs              Wheelchair Mobility    Modified Rankin (Stroke Patients Only)       Balance Overall balance assessment: Needs assistance   Sitting balance-Leahy Scale: Good       Standing balance-Leahy Scale: Good                              Cognition Arousal/Alertness: Awake/alert Behavior During Therapy: WFL for tasks assessed/performed Overall Cognitive Status: Within Functional Limits for tasks assessed                                          Exercises Other Exercises Other Exercises: Practiced balance HEP including standing heel raises, Rhomberg stance, Tandem, SLS, and standing marches. Medbridge [id number]    General Comments        Pertinent Vitals/Pain      Home Living Family/patient expects to be discharged to:: Private residence Living Arrangements: Spouse/significant other Available Help at Discharge: Family Type of Home: House Home Access: Level entry       [place]: [place]: [place] - single [place] (2 wheels);Shower seat;BSC/3in1      Prior Function            PT Goals (current goals can now be found in the care plan section) Acute Rehab PT Goals Patient Stated Goal: regain her strength and balance; not need an assistive device PT Goal Formulation: With patient Time For Goal Achievement: 10/27/21 Potential  to Achieve Goals: Good Progress towards PT goals: Progressing toward goals    Frequency    Min 3X/week      PT Plan Current plan remains appropriate    Co-evaluation              AM-PAC PT "6 Clicks" Mobility   Outcome Measure  Help needed turning from your back to your side while in a flat bed without using bedrails?: None Help needed moving from lying on your back to sitting on the side of a flat bed without using bedrails?: None Help needed moving to and from a bed to a chair (including a wheelchair)?: A Little Help needed standing up from a chair using your  arms (e.g., wheelchair or bedside chair)?: A Little Help needed to walk in hospital room?: A Little Help needed climbing 3-5 steps with a railing? : A Little 6 Click Score: 20    End of Session Equipment Utilized During Treatment: Gait belt Activity Tolerance: Patient tolerated treatment well Patient left: with call bell/phone within reach;in bed Nurse Communication: Mobility status PT Visit Diagnosis: Unsteadiness on feet (R26.81);Muscle weakness (generalized) (M62.81)     Time: 3220-2542 PT Time Calculation (min) (ACUTE ONLY): 17 min  Charges:  $Gait Training: 8-22 mins                     Reuel Derby, PT, DPT  Acute Rehabilitation Services  Pager: [phone] Office: [phone]    Rudean Hitt 10/14/2021, 4:40 PM

## 2021-10-14 NOTE — Progress Notes (Signed)
During assessment, patient mentioned that she was informed that she still has marking clips after her right breast lumpectomy.   Image reviewed with Dr. Dwaine Gale, the patient does not have marking clips but has surgical clips.  The surgical clips should be MRI compatible, but encouraged the patient to mention it to MRI technologist she she needs MRI in the future.   Please call IR or the Breast Center if you have any questions.   Armando Gang Jolynn Bajorek PA-C 10/14/2021 11:37 AM

## 2021-10-14 NOTE — Discharge Instructions (Signed)
Information on my medicine - ELIQUIS (apixaban)  This medication education was reviewed with me or my healthcare representative as part of my discharge preparation.  The pharmacist that spoke with me during my hospital stay was:  Arman Bogus, [facility]  Why was Eliquis prescribed for you? Eliquis was prescribed for you to reduce the risk of a blood clot forming that can cause a stroke if you have a medical condition called atrial fibrillation (a type of irregular heartbeat).  What do You need to know about Eliquis ? Take your Eliquis TWICE DAILY - one tablet in the morning and one tablet in the evening with or without food. If you have difficulty swallowing the tablet whole please discuss with your pharmacist how to take the medication safely.  Take Eliquis exactly as prescribed by your doctor and DO NOT stop taking Eliquis without talking to the doctor who prescribed the medication.  Stopping may increase your risk of developing a stroke.  Refill your prescription before you run out.  After discharge, you should have regular check-up appointments with your healthcare provider that is prescribing your Eliquis.  In the future your dose may need to be changed if your kidney function or weight changes by a significant amount or as you get older.  What do you do if you miss a dose? If you miss a dose, take it as soon as you remember on the same day and resume taking twice daily.  Do not take more than one dose of ELIQUIS at the same time to make up a missed dose.  Important Safety Information A possible side effect of Eliquis is bleeding. You should call your healthcare provider right away if you experience any of the following: Bleeding from an injury or your nose that does not stop. Unusual colored urine (red or dark brown) or unusual colored stools (red or black). Unusual bruising for unknown reasons. A serious fall or if you hit your head (even if there is no bleeding).  Some  medicines may interact with Eliquis and might increase your risk of bleeding or clotting while on Eliquis. To help avoid this, consult your healthcare provider or pharmacist prior to using any new prescription or non-prescription medications, including herbals, vitamins, non-steroidal anti-inflammatory drugs (NSAIDs) and supplements.  This website has more information on Eliquis (apixaban): http://www.eliquis.com/eliquis/home

## 2021-10-14 NOTE — Progress Notes (Signed)
Pharmacy Antibiotic Note  Kayla Bennett is a 70 y.o. female admitted on 10/12/2021 with sepsis.  Pharmacy has been consulted for vancomycin dosing - d/c on admission but now re-ordered. Also on ceftriaxone. ID following.  Plan:  Restart Vancomycin 1000mg  IV q24h (eAUC 476, Cr 0.82, Vd 0.5) Monitor renal function, clinical status, and antibiotic plan Order vanc levels as necessary  Weight: 93.1 kg (205 lb 4.8 oz)  Temp (24hrs), Avg:98.8 F (37.1 C), Min:98.5 F (36.9 C), Max:99 F (37.2 C)  Recent Labs  Lab 10/12/21 1413 10/12/21 1639 10/13/21 0548 10/14/21 0219  WBC  --  11.7* 11.6* 7.8  CREATININE 0.82  --  0.82 0.71  LATICACIDVEN  --  1.3  --   --      Estimated Creatinine Clearance: 69.5 mL/min (by C-G formula based on SCr of 0.71 mg/dL).    Allergies  Allergen Reactions   Lotensin [Benazepril Hcl] Swelling   Morphine Itching and Rash   Erythromycin Base Other (See Comments)    Unknown reaction   Hydrocodone Other (See Comments)    Unknown reaction     Other Swelling    Mustard - vaginal swelling/pain    Banana Swelling    vaginal swelling/pain   Chocolate Swelling    vaginal swelling/pain   Gluten Meal Diarrhea    Upset stomach    Tizanidine Other (See Comments)    Lowered blood pressure    Wound Dressing Adhesive Other (See Comments)    Skin burns, blisters     Antimicrobials this admission: Vanc 11/26 >> 11/27; restart 11/28 Ceftriaxone 11/26 >>   Dose adjustments this admission: N/A  Microbiology results: 11/26 BCx:  MRSA PCR:   Thank you for allowing pharmacy to be a part of this patient's care.  Sherlon Handing, PharmD, BCPS Please see amion for complete clinical pharmacist phone list 10/14/2021 6:10 AM

## 2021-10-14 NOTE — Progress Notes (Signed)
PROGRESS NOTE  Kayla Bennett LOV:[id number] DOB: 1951/08/12 DOA: 10/12/2021 PCP: Truitt Merle, MD  HPI/Recap of past 24 hours:  Kayla Bennett is a 70 y.o. female with medical history significant for chronic iron deficiency anemia Baseline hemoglobin 9-11, paroxysmal A. fib on Eliquis, type 2 diabetes, psoriasis stage I right-sided breast cancer diagnosed in January 2022, completed radiation therapy and is on tamoxifen, who was admitted to [facility] on 10/12/2021 with sepsis of unclear source after presenting from home to [facility] ED complaining of fever of 2 days duration.  Associated with generalized fatigue, weakness, and intermittent headache.  Work-up revealed sepsis of unknown source, fever with T-max 103.1, negative UA and negative chest x-ray.  ID consulted, pan CT ordered.  10/13/2021: Patient was seen and examined at bedside.  She has no new complaints.  Denies having any pain.  CT chest abdomen and pelvis with contrast showed fluid collection in the right axilla and posterior right breast question postoperative such as seroma, hematoma or lymphocele, infection not excluded by CT.  10/14/2021: Patient was seen and examined at bedside.  She has no new complaints.  Tenderness noted with palpation of right axilla.  Assessment/Plan: Principal Problem:   Sepsis ([place]) Active Problems:   Malignant neoplasm of upper-outer quadrant of right breast in female, estrogen receptor positive (HCC)   Persistent atrial fibrillation (HCC)   Iron deficiency anemia   Fever   Nausea   Headache   Generalized weakness   Diabetes ([place])   Hypertension   GERD (gastroesophageal reflux disease)  Sepsis of unknown source Presented with 2 days of fever generalized fatigue and weakness T-max 103.1 in the ED. Respiration rate 30. UA and chest x-ray negative CT chest/abdomen pelvis with contrast revealed fluid collection in the right axilla and posterior right breast question postoperative such as  seroma, hematoma or lymphocele, infection not excluded by CT. Currently on Rocephin empirically Received Rocephin on admission, DC'd on 10/13/2021. Continue to follow blood cultures. Monitor fever curve and WBC. ID consulted to assist with the management  Stage I right breast cancer, follows with medical oncology Dr. Burr Medico Post radiation, currently on tamoxifen Will curbside with oncology on 10/14/2021.  Intermittent headache Follow MRI brain with and without contrast ordered by ID Continue symptomatic management  Paroxysmal A. fib on Eliquis Rate controlled on Paxil Continue Eliquis for CVA prevention  Chronic anxiety/depression Continue home Effexor  Type 2 diabetes with diabetic polyneuropathy As hemoglobin A1c 6.7 on 02/13/2021. Continues insulin sliding scale Continue gabapentin Avoid hypoglycemia  GERD Continue home PPI twice daily.  Iron deficiency anemia IV Feraheme infusion Monitor H&H     Code Status: Full code  Family Communication: None at bedside  Disposition Plan: Likely will discharge to home once infectious disease signs off.   Consultants: Infectious disease  Procedures: None  Antimicrobials: Rocephin IV vancomycin DC'd on 10/13/2021  DVT prophylaxis: Eliquis  Status is: Inpatient  Inpatient status.  Patient requires at least 2 midnights for further evaluation and treatment of present condition.        Objective: Vitals:   10/14/21 1118 10/14/21 1200 10/14/21 1553 10/14/21 1635  BP: (!) 148/86 122/67 125/68   Pulse:  78 94   Resp:  16 16   Temp:  98.4 F (36.9 C) 98.4 F (36.9 C) 99.7 F (37.6 C)  TempSrc:  Oral Oral Oral  SpO2:  98% 100%   Weight:        Intake/Output Summary (Last 24 hours) at 10/14/2021 1747  Last data filed at 10/14/2021 1637 Gross per 24 hour  Intake 1683.33 ml  Output --  Net 1683.33 ml   Filed Weights   10/12/21 2000 10/14/21 0413  Weight: 90.7 kg 93.1 kg    Exam:  General: 70 y.o.  year-old female well-developed well-nourished in no acute distress.  She is alert and oriented x3. Cardiovascular: Regular rate and rhythm no rubs or gallops.  Respiratory: Clear to auscultation no wheezes or rales.  Abdomen: Soft nontender normal bowel sounds present.  Musculoskeletal: No lower extremity edema. 2/4 pulses in all 4 extremities.  Tenderness noted with palpation of right axilla. Skin: No ulcerative lesions noted or rashes, Psychiatry: Mood is appropriate for condition and setting   Data Reviewed: CBC: Recent Labs  Lab 10/12/21 1639 10/13/21 0548 10/14/21 0219  WBC 11.7* 11.6* 7.8  NEUTROABS 8.5* 8.5* 5.4  HGB 11.3* 10.7* 10.4*  HCT 36.1 34.4* 33.3*  MCV 89.6 90.5 91.2  PLT 173 152 440   Basic Metabolic Panel: Recent Labs  Lab 10/12/21 1413 10/13/21 0548 10/14/21 0219  NA 135 135 138  K 3.9 3.1* 3.7  CL 101 103 105  CO2 24 22 26   GLUCOSE 153* 174* 137*  BUN 12 11 8   CREATININE 0.82 0.82 0.71  CALCIUM 8.8* 7.8* 8.0*  MG  --  1.5*  1.5* 2.1  PHOS  --   --  2.1*   GFR: Estimated Creatinine Clearance: 69.5 mL/min (by C-G formula based on SCr of 0.71 mg/dL). Liver Function Tests: Recent Labs  Lab 10/13/21 0548 10/14/21 0219  AST 26  26 23   ALT 19  19 24   ALKPHOS 49  50 47  BILITOT 0.8  0.9 0.4  PROT 6.1*  6.0* 6.1*  ALBUMIN 2.8*  2.8* 2.7*   No results for input(s): LIPASE, AMYLASE in the last 168 hours. No results for input(s): AMMONIA in the last 168 hours. Coagulation Profile: No results for input(s): INR, PROTIME in the last 168 hours. Cardiac Enzymes: No results for input(s): CKTOTAL, CKMB, CKMBINDEX, TROPONINI in the last 168 hours. BNP (last 3 results) No results for input(s): PROBNP in the last 8760 hours. HbA1C: No results for input(s): HGBA1C in the last 72 hours. CBG: Recent Labs  Lab 10/13/21 1647 10/13/21 2109 10/14/21 0721 10/14/21 1157 10/14/21 1552  GLUCAP 172* 138* 134* 147* 135*   Lipid Profile: No results  for input(s): CHOL, HDL, LDLCALC, TRIG, CHOLHDL, LDLDIRECT in the last 72 hours. Thyroid Function Tests: Recent Labs    10/13/21 0602  TSH 2.875   Anemia Panel: No results for input(s): VITAMINB12, FOLATE, FERRITIN, TIBC, IRON, RETICCTPCT in the last 72 hours. Urine analysis:    Component Value Date/Time   COLORURINE YELLOW 10/12/2021 1946   APPEARANCEUR CLEAR 10/12/2021 1946   LABSPEC 1.008 10/12/2021 1946   PHURINE 6.0 10/12/2021 1946   GLUCOSEU NEGATIVE 10/12/2021 1946   HGBUR NEGATIVE 10/12/2021 1946   BILIRUBINUR NEGATIVE 10/12/2021 1946   KETONESUR NEGATIVE 10/12/2021 1946   PROTEINUR NEGATIVE 10/12/2021 1946   NITRITE NEGATIVE 10/12/2021 1946   LEUKOCYTESUR NEGATIVE 10/12/2021 1946   Sepsis Labs: @LABRCNTIP (procalcitonin:4,lacticidven:4)  ) Recent Results (from the past 240 hour(s))  Resp Panel by RT-PCR (Flu A&B, Covid) Nasopharyngeal Swab     Status: None   Collection Time: 10/12/21  2:13 PM   Specimen: Nasopharyngeal Swab; Nasopharyngeal(NP) swabs in vial transport medium  Result Value Ref Range Status   SARS Coronavirus 2 by RT PCR NEGATIVE NEGATIVE Final    Comment: (NOTE) SARS-CoV-2  target nucleic acids are NOT DETECTED.  The SARS-CoV-2 RNA is generally detectable in upper respiratory specimens during the acute phase of infection. The lowest concentration of SARS-CoV-2 viral copies this assay can detect is 138 copies/mL. A negative result does not preclude SARS-Cov-2 infection and should not be used as the sole basis for treatment or other patient management decisions. A negative result may occur with  improper specimen collection/handling, submission of specimen other than nasopharyngeal swab, presence of viral mutation(s) within the areas targeted by this assay, and inadequate number of viral copies(<138 copies/mL). A negative result must be combined with clinical observations, patient history, and epidemiological information. The expected result is  Negative.  Fact Sheet for Patients:  EntrepreneurPulse.com.au  Fact Sheet for Healthcare Providers:  IncredibleEmployment.be  This test is no t yet approved or cleared by the [place] FDA and  has been authorized for detection and/or diagnosis of SARS-CoV-2 by FDA under an Emergency Use Authorization (EUA). This EUA will remain  in effect (meaning this test can be used) for the duration of the COVID-19 declaration under Section 564(b)(1) of the Act, 21 U.S.C.section 360bbb-3(b)(1), unless the authorization is terminated  or revoked sooner.       Influenza A by PCR NEGATIVE NEGATIVE Final   Influenza B by PCR NEGATIVE NEGATIVE Final    Comment: (NOTE) The Xpert Xpress SARS-CoV-2/FLU/RSV plus assay is intended as an aid in the diagnosis of influenza from Nasopharyngeal swab specimens and should not be used as a sole basis for treatment. Nasal washings and aspirates are unacceptable for Xpert Xpress SARS-CoV-2/FLU/RSV testing.  Fact Sheet for Patients: EntrepreneurPulse.com.au  Fact Sheet for Healthcare Providers: IncredibleEmployment.be  This test is not yet approved or cleared by the [place] FDA and has been authorized for detection and/or diagnosis of SARS-CoV-2 by FDA under an Emergency Use Authorization (EUA). This EUA will remain in effect (meaning this test can be used) for the duration of the COVID-19 declaration under Section 564(b)(1) of the Act, 21 U.S.C. section 360bbb-3(b)(1), unless the authorization is terminated or revoked.  Performed at [facility], [place] [address], [place], [place] [address]   Blood culture (routine x 2)     Status: None (Preliminary result)   Collection Time: 10/12/21  4:39 PM   Specimen: BLOOD LEFT HAND  Result Value Ref Range Status   Specimen Description BLOOD LEFT HAND  Final   Special Requests   Final    BOTTLES DRAWN AEROBIC AND ANAEROBIC  Blood Culture results may not be optimal due to an inadequate volume of blood received in culture bottles   Culture   Final    NO GROWTH 2 DAYS Performed at [facility], [place] [address] [place], [place] [address]    Report Status PENDING  Incomplete  Blood culture (routine x 2)     Status: None (Preliminary result)   Collection Time: 10/13/21  5:48 AM   Specimen: BLOOD LEFT HAND  Result Value Ref Range Status   Specimen Description BLOOD LEFT HAND  Final   Special Requests AEROBIC BOTTLE ONLY Blood Culture adequate volume  Final   Culture   Final    NO GROWTH 1 DAY Performed at [facility], [place] [address] [place], [place] [address]    Report Status PENDING  Incomplete  MRSA Next Gen by PCR, Nasal     Status: None   Collection Time: 10/14/21  9:24 AM   Specimen: Nasal Mucosa; Nasal Swab  Result Value  Ref Range Status   MRSA by PCR Next Gen NOT DETECTED NOT DETECTED Final    Comment: (NOTE) The GeneXpert MRSA Assay (FDA approved for NASAL specimens only), is one component of a comprehensive MRSA colonization surveillance program. It is not intended to diagnose MRSA infection nor to guide or monitor treatment for MRSA infections. Test performance is not FDA approved in patients less than 10 years old. Performed at [facility], [place] [address], [place], [place] [address]   Aerobic/Anaerobic Culture w Gram Stain (surgical/deep wound)     Status: None (Preliminary result)   Collection Time: 10/14/21 11:57 AM   Specimen: Wound; Serous Fluid  Result Value Ref Range Status   Specimen Description WOUND RIGHT AXILLA  Final   Special Requests ASPIRATE  Final   Gram Stain   Final    FEW WBC PRESENT, PREDOMINANTLY MONONUCLEAR NO ORGANISMS SEEN Performed at [facility], [address], [place], [place] [address]    Culture PENDING  Incomplete   Report Status PENDING  Incomplete      Studies: MR BRAIN W WO CONTRAST  Result Date: 10/13/2021 CLINICAL DATA:   Headache, new or worsening EXAM: MRI HEAD WITHOUT AND WITH CONTRAST TECHNIQUE: Multiplanar, multiecho pulse sequences of the brain and surrounding structures were obtained without and with intravenous contrast. CONTRAST:  57mL GADAVIST GADOBUTROL 1 MMOL/ML IV SOLN COMPARISON:  None. FINDINGS: Brain: No restricted diffusion to suggest acute or subacute infarct. No acute hemorrhage, hydrocephalus, extra-axial collection, or mass lesion. The ventricles and sulci are within normal limits for age. No abnormal enhancement. Vascular: Normal flow voids. Skull and upper cervical spine: Normal marrow signal. Sinuses/Orbits: Mucosal thickening in the right maxillary sinus. The orbits are unremarkable. Other: Trace fluid in left mastoid air cells. IMPRESSION: No acute intracranial process. No etiology is seen for the patient's headache. Electronically Signed   By: Merilyn Baba M.D.   On: 10/13/2021 20:46   [place] IMAGE GUIDED DRAINAGE BY PERCUTANEOUS CATHETER  Result Date: 10/14/2021 INDICATION: Infection, unknown etiology. RIGHT breast and axillary collections on CT. EXAM: US-GUIDED DRAINAGE OF RIGHT AXILLARY FLUID COLLECTION COMPARISON:  CT CAP, 10/13/2021. MEDICATIONS: The patient is currently admitted to the hospital and receiving intravenous antibiotics. The antibiotics were administered within an appropriate time frame prior to the initiation of the procedure. ANESTHESIA/SEDATION: Local anesthetic was administered. CONTRAST:  None COMPLICATIONS: None immediate. PROCEDURE: Informed written consent was obtained from the patient after a discussion of the risks, benefits and alternatives to treatment. Preprocedural ultrasound scanning demonstrated well-circumscribed, mildly complex RIGHT axillary fluid collection. A timeout was performed prior to the initiation of the procedure. The RIGHT chest and axilla was prepped and draped in the usual sterile fashion. The overlying soft tissues were anesthetized with 1% lidocaine with  epinephrine. Under direct ultrasound guidance, a 18 gauge 10 cm Yueh needle was advanced into the abscess/fluid collection. Multiple ultrasound images were saved for procedural documentation purposes. Next, approximately 5 mL serous fluid was aspirated from the collection. A representative sample of aspirated fluid was capped and sent to the laboratory for analysis. The needle was removed and superficial hemostasis was achieved with manual compression. A dressing was placed. The patient tolerated the procedure well without immediate postprocedural complication. IMPRESSION: Successful US-guided aspiration of RIGHT axillary fluid collection. A representative aspirated sample was sent to the laboratory as requested by the ordering clinical team. Michaelle Birks, MD [facility] Electronically Signed   By: Francesco Runner.D.  On: 10/14/2021 15:40    Scheduled Meds:  apixaban  5 mg Oral BID   gabapentin  200 mg Oral QHS   losartan  100 mg Oral Daily   And   hydrochlorothiazide  25 mg Oral Daily   insulin aspart  0-6 Units Subcutaneous TID WC   lidocaine (PF)       loratadine  10 mg Oral Daily   metoprolol succinate  25 mg Oral Daily   pantoprazole  40 mg Oral BID AC   potassium & sodium phosphates  1 packet Oral TID WC & HS   senna-docusate  2 tablet Oral Q0600   tamoxifen  20 mg Oral Daily   venlafaxine XR  37.5 mg Oral QHS   venlafaxine XR  75 mg Oral QHS    Continuous Infusions:  cefTRIAXone (ROCEPHIN)  IV 2 g (10/14/21 1744)   lactated ringers 50 mL/hr at 10/14/21 1418   vancomycin 1,000 mg (10/14/21 0944)     LOS: 2 days     Kayleen Memos, MD [facility] Pager [phone]  If 7PM-7AM, please contact night-coverage [email or web address] Password [facility] 10/14/2021, 5:47 PM

## 2021-10-14 NOTE — Progress Notes (Addendum)
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  She is receiving adjuvant endocrine therapy with tamoxifen.  This was started in July 2022.  She was last seen in our office on 07/29/2021 for survivorship visit.  She was noted to have worsening anemia at that visit and further work-up showed B12 and iron deficiency.  She was started on oral iron and vitamin B12.  She continues to have anemia despite taking oral iron and B12 and IV iron was recommended.  It does not appear as though she has received IV iron yet but was scheduled to receive this on 10/15/2021.  Now admitted to the hospital with sepsis.  She was having a fever for 2 days prior to admission.  This was associated with generalized fatigue, weakness, and intermittent headache.  As part of her work-up, she had a CT of the chest/abdomen/pelvis which showed no acute intrathoracic, intra-abdominal, or intrapelvic abnormality.  There was a fluid collection in the right axilla and posterior right breast which could be a postop seroma, hematoma, or lymphocele.  Due to headaches, an MRI of the brain was ordered which showed no acute intracranial process.  ID has been consulted and is following.  They recommended aspiration of her right axilla/right breast fluid collection.  This was performed this morning.  Last fever was on 10/13/2021 at 0330 at that time her temp was 101.2.  She has been afebrile since this time.  She reported having pain and swelling to her right axilla/right breast.  She reported having a very severe headache earlier but her headache is much better at this time.  States that she has dyspnea with exertion.  Feels that her weakness and fatigue may have improved slightly with oral iron, but not completely resolved.  She denies bleeding.  She denies dysuria and cough.  Oncology History Overview Note  Cancer Staging Malignant neoplasm of  upper-outer quadrant of right breast in female, estrogen receptor positive ([place]) Staging form: Breast, AJCC 8th Edition - Clinical stage from 11/22/2020: Stage IA (cT1b, cN0, cM0, G2, ER+, PR+, HER2-) - Signed by Truitt Merle, MD on 12/04/2020 Stage prefix: Initial diagnosis - Pathologic: Stage IA (pT1c, pN0, cM0, G2, ER+, PR+, HER2-) - Signed by Gardenia Phlegm, NP on 02/27/2021 Histologic grading system: 3 grade system    Malignant neoplasm of upper-outer quadrant of right breast in female, estrogen receptor positive ([place])  11/05/2020 Breast US   US Breast 11/05/20  IMPRESSION No Sonographic correlate is seen for the round microlobulated 31mm mass in the upper outer right breast 10cm from the nipple, 9: 00 position.  Given this mass is new on mammography and has suspicious borders, a stereotactic guided biopsy is recommended.     11/22/2020 Cancer Staging   Staging form: Breast, AJCC 8th Edition - Clinical stage from 11/22/2020: Stage IA (cT1b, cN0, cM0, G2, ER+, PR+, HER2-) - Signed by Truitt Merle, MD on 12/04/2020    11/22/2020 Initial Biopsy    Diagnosis 11/22/20  Breast, right, needle core biopsy, 10 cmfn, upper outer quadrant, post depth - INVASIVE DUCTAL CARCINOMA. SEE NOTE Diagnosis Note Carcinoma measures 0.7 cm in greatest linear dimension and appears grade 2. Dr. Tresa Moore reviewed the case and concurs with the diagnosis. A breast prognostic profile (ER, PR, Ki-67 and HER2) is pending and will be reported in an addendum. Dr. Luan Pulling was notified on 11/23/2020.   11/22/2020 Receptors her2    ROGNOSTIC INDICATORS  Results: IMMUNOHISTOCHEMICAL AND MORPHOMETRIC ANALYSIS PERFORMED MANUALLY The tumor cells are NEGATIVE for Her2 (1+). Estrogen Receptor: 95%, POSITIVE, STRONG STAINING INTENSITY Progesterone Receptor: 95%, POSITIVE, STRONG STAINING INTENSITY Proliferation Marker Ki67: 10%   11/28/2020 Initial Diagnosis   Malignant neoplasm of upper-outer quadrant of right breast in female,  estrogen receptor positive ([place])   12/22/2020 Genetic Testing   Negative hereditary cancer genetic testing: no pathogenic variants detected in [facility] Common Hereditary Cancers Panel + Melanoma Genes.  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  The report date is December 22, 2020.    The Common Hereditary Cancers Panel+Melanoma Genes offered by [facility] includes sequencing and/or deletion duplication testing of the following 52 genes: APC, ATM, AXIN2, BAP1, BARD1, BMPR1A, BRCA1, BRCA2, BRIP1, CDH1, CDK4, CDKN2A (p14ARF), CDKN2A (p16INK4a), CHEK2, CTNNA1, DICER1, EPCAM (Deletion/duplication testing only), GREM1 (promoter region deletion/duplication testing only), HOXB13, KIT, MEN1, MITF, MLH1, MSH2, MSH3, MSH6, MUTYH, NBN, NF1, NHTL1, PALB2, PDGFRA, PMS2, POLD1, POLE, PTEN, POT1, RAD50, RAD51C, RAD51D, RB1, RNF43, SDHA, SDHB, SDHC, SDHD, SMAD4, SMARCA4, STK11, TP53, TSC1, TSC2, and VHL.  The following genes were evaluated for sequence changes only: SDHA and HOXB13 c.251G>A variant only.   02/18/2021 Surgery   FINAL MICROSCOPIC DIAGNOSIS:   A. BREAST, RIGHT, LUMPECTOMY:  - Invasive ductal carcinoma, 1.2 cm, grade 2  - Ductal carcinoma in situ, intermediate grade  - Resection margins are negative for carcinoma; posterior margin is less than 1 mm from carcinoma  - Biopsy site changes  - See oncology table   B. LYMPH NODE, RIGHT AXILLARY #1, SENTINEL, EXCISION:  - Lymph node, negative for carcinoma (0/1)   C. LYMPH NODE, RIGHT AXILLARY #2, SENTINEL, EXCISION:  - Lymph node, negative for carcinoma (0/1)   D. LYMPH NODE, RIGHT AXILLARY, SENTINEL, EXCISION:  - Lymph node, negative for carcinoma (0/1)   E. LYMPH NODE, RIGHT AXILLARY #3, SENTINEL, EXCISION:  - Lymph node, negative for carcinoma (0/1)   F. LYMPH NODE, RIGHT AXILLARY, SENTINEL, EXCISION:  - Lymph node, negative for carcinoma (0/1)     02/18/2021 Oncotype testing   Oncotype DX was obtained on the  final surgical sample and the recurrence score of 0 predicts a risk of recurrence outside the breast over the next 9 years of 3%, if the patient's only systemic therapy is an antiestrogen for 5 years.  It also predicts no benefit from chemotherapy.   02/27/2021 Cancer Staging   Staging form: Breast, AJCC 8th Edition - Pathologic: Stage IA (pT1c, pN0, cM0, G2, ER+, PR+, HER2-) - Signed by Gardenia Phlegm, NP on 02/27/2021 Histologic grading system: 3 grade system    03/2021 - 04/2021 Radiation Therapy   Completed adjuvant Radiation by Dr. Isidore Moos   05/2021 -  Neo-Adjuvant Anti-estrogen oral therapy   Began tamoxifen mid July 2022   07/29/2021 Survivorship   SCP delivered by Cira Rue, NP      REVIEW OF SYSTEMS:   Constitutional: Afebrile for the past 24 hours Eyes: Denies blurriness of vision Ears, nose, mouth, throat, and face: Denies mucositis or sore throat Respiratory: Denies cough, dyspnea or wheezes Cardiovascular: Denies palpitation, chest discomfort Gastrointestinal:  Denies nausea, heartburn or change in bowel habits Skin: Reports some pain and swelling to her right axilla/right breast Lymphatics: Denies new lymphadenopathy or easy bruising Neurological:Denies numbness, tingling or new weaknesses Behavioral/Psych: Mood is stable, no new changes  All other systems were reviewed with the patient and are negative.  I have reviewed the past medical history, past surgical history,  social history and family history with the patient and they are unchanged from previous note.   PHYSICAL EXAMINATION: ECOG PERFORMANCE STATUS: 1 - Symptomatic but completely ambulatory  Vitals:   10/14/21 1118 10/14/21 1200  BP: (!) 148/86 122/67  Pulse:  78  Resp:  16  Temp:  98.4 F (36.9 C)  SpO2:  98%   Filed Weights   10/12/21 2000 10/14/21 0413  Weight: 90.7 kg 93.1 kg    Intake/Output from previous day: 11/27 0701 - 11/28 0700 In: 978.8 [P.O.:600; I.V.:278.8; IV  Piggyback:100] Out: -   GENERAL:alert, no distress and comfortable SKIN: Skin to the right axilla/right breast without erythema, area is tender to touch LUNGS: clear to auscultation and percussion with normal breathing effort HEART: regular rate & rhythm and no murmurs and no lower extremity edema ABDOMEN:abdomen soft, non-tender and normal bowel sounds NEURO: alert & oriented x 3 with fluent speech, no focal motor/sensory deficits  LABORATORY DATA:  I have reviewed the data as listed CMP Latest Ref Rng & Units 10/14/2021 10/13/2021 10/13/2021  Glucose 70 - 99 mg/dL 137(H) 174(H) -  BUN 8 - 23 mg/dL 8 11 -  Creatinine 0.44 - 1.00 mg/dL 0.71 0.82 -  Sodium 135 - 145 mmol/L 138 135 -  Potassium 3.5 - 5.1 mmol/L 3.7 3.1(L) -  Chloride 98 - 111 mmol/L 105 103 -  CO2 22 - 32 mmol/L 26 22 -  Calcium 8.9 - 10.3 mg/dL 8.0(L) 7.8(L) -  Total Protein 6.5 - 8.1 g/dL 6.1(L) 6.1(L) 6.0(L)  Total Bilirubin 0.3 - 1.2 mg/dL 0.4 0.8 0.9  Alkaline Phos 38 - 126 U/L 47 49 50  AST 15 - 41 U/L $Remo'23 26 26  'JSKce$ ALT 0 - 44 U/L $Remo'24 19 19    'OhRRc$ Lab Results  Component Value Date   WBC 7.8 10/14/2021   HGB 10.4 (L) 10/14/2021   HCT 33.3 (L) 10/14/2021   MCV 91.2 10/14/2021   PLT 154 10/14/2021   NEUTROABS 5.4 10/14/2021    DG Chest 2 View  Result Date: 10/12/2021 CLINICAL DATA:  Chest pain, shortness of breath dizziness EXAM: CHEST - 2 VIEW COMPARISON:  02/04/2008 FINDINGS: Cardiac size is within normal limits. There are no signs of pulmonary edema or focal pulmonary consolidation. There are small linear densities in the left lower lung fields suggesting subsegmental atelectasis or scarring. There is no pleural effusion or pneumothorax. There is possible 3 mm calcific density in the left mid lung fields. Surgical clips are seen in the right axilla. IMPRESSION: There are no signs of pulmonary edema or focal pulmonary consolidation. Small transverse linear densities in the left lower lung fields suggest scarring or  subsegmental atelectasis. Electronically Signed   By: Elmer Picker M.D.   On: 10/12/2021 14:53   MR BRAIN W WO CONTRAST  Result Date: 10/13/2021 CLINICAL DATA:  Headache, new or worsening EXAM: MRI HEAD WITHOUT AND WITH CONTRAST TECHNIQUE: Multiplanar, multiecho pulse sequences of the brain and surrounding structures were obtained without and with intravenous contrast. CONTRAST:  43mL GADAVIST GADOBUTROL 1 MMOL/ML IV SOLN COMPARISON:  None. FINDINGS: Brain: No restricted diffusion to suggest acute or subacute infarct. No acute hemorrhage, hydrocephalus, extra-axial collection, or mass lesion. The ventricles and sulci are within normal limits for age. No abnormal enhancement. Vascular: Normal flow voids. Skull and upper cervical spine: Normal marrow signal. Sinuses/Orbits: Mucosal thickening in the right maxillary sinus. The orbits are unremarkable. Other: Trace fluid in left mastoid air cells. IMPRESSION: No acute intracranial process. No  etiology is seen for the patient's headache. Electronically Signed   By: Merilyn Baba M.D.   On: 10/13/2021 20:46   CT CHEST ABDOMEN PELVIS W CONTRAST  Result Date: 10/13/2021 CLINICAL DATA:  Chronic iron deficiency anemia, paroxysmal atrial fibrillation, type II diabetes mellitus, sepsis of unknown source, past history breast cancer, hypertension, GERD EXAM: CT CHEST, ABDOMEN, AND PELVIS WITH CONTRAST TECHNIQUE: Multidetector CT imaging of the chest, abdomen and pelvis was performed following the standard protocol during bolus administration of intravenous contrast. CONTRAST:  117m OMNIPAQUE IOHEXOL 300 MG/ML  SOLN COMPARISON:  CT abdomen and pelvis 08/14/2005 FINDINGS: CT CHEST FINDINGS Cardiovascular: Atherosclerotic calcifications aorta and coronary arteries. Aorta normal caliber. Pulmonary vascular structures grossly patent on non targeted exam. Heart unremarkable. No pericardial effusion. Mediastinum/Nodes: Esophagus normal appearance. Base of cervical  region normal appearance. No thoracic adenopathy. Surgical clips RIGHT RIGHT axilla. Focal fluid collection RIGHT axilla 4.9 x 3.8 cm, may represent a postoperative collection such as hematoma, seroma or lymphocele, less likely necrotic lymph node. A second fluid collection is seen in the posterior RIGHT breast 2.9 x 2.7 cm. Lungs/Pleura: Tiny calcified granulomata at lateral RIGHT lung base. Lungs otherwise clear. No pulmonary infiltrate, pleural effusion, or pneumothorax. Musculoskeletal: Diffuse osseous demineralization. CT ABDOMEN PELVIS FINDINGS Hepatobiliary: Gallbladder surgically absent. Liver normal appearance Pancreas: Normal appearance Spleen: Normal appearance. Small probable splenule anterior to spleen. Adrenals/Urinary Tract: Adrenal glands normal appearance. Kidneys, ureters, and bladder normal appearance Stomach/Bowel: Appendix not visualized, no pericecal inflammatory process seen. Small hiatal hernia. Stomach and bowel loops otherwise normal appearance. Vascular/Lymphatic: Atherosclerotic calcifications aorta and iliac arteries without aneurysm. Vascular structures patent. No adenopathy. Reproductive: Uterus surgically absent with nonvisualization of ovaries Other: No free air or free fluid. No hernia or inflammatory process. Musculoskeletal: Mild osseous demineralization. IMPRESSION: No acute intrathoracic, intra-abdominal or intrapelvic abnormalities. Small hiatal hernia. Fluid collections in the RIGHT axilla and posterior RIGHT breast question postoperative such as seroma, hematoma, or lymphocele; infection not excluded by CT. Aortic Atherosclerosis (ICD10-I70.0). Electronically Signed   By: MLavonia DanaM.D.   On: 10/13/2021 12:38    ASSESSMENT AND PLAN: 1.  Stage Ia ER/PR positive/HER2 negative right breast cancer 2.  Anemia secondary to iron deficiency and vitamin B12 deficiency 3.  Sepsis of unclear etiology 4.  Right breast/axilla fluid collection likely due to seroma 5.  Headache,  resolved 6.  Paroxysmal atrial fibrillation 7.  Chronic anxiety/depression 8.  Diabetes mellitus with diabetic polyneuropathy  -CT scans and MRI reviewed with no evidence of breast cancer recurrence.  Recommend continuation of tamoxifen 20 mg daily. -The patient has known iron deficiency and vitamin B12 deficiency anemia.  She was scheduled to receive a dose of IV iron tomorrow as an outpatient.  We will proceed with Feraheme 510 mg IV x1 dose while she is hospitalized.  Discussed possible adverse effects with the patient and she is agreeable to proceed. -UA negative.  Blood cultures negative to date.  Chest x-ray negative.  Noted to have seroma at the right axilla/right breast which may be the source of infection.  This fluid was drained earlier today by IR.  Fluid has been sent for culture and cytology.  We will follow-up on this.  Antibiotics per hospitalist/ID.   Future Appointments  Date Time Provider DPerson 10/15/2021  9:30 AM [facility] 2 [facility] None  10/17/2021  2:00 PM BDowney Blaire L, PT [facility] None  10/25/2021 12:30 PM [facility] None  10/25/2021  1:00 PM FTruitt Merle MD  [facility] None  11/05/2021  9:00 AM Camnitz, Ocie Doyne, MD [id number]      LOS: 2 days   Mikey Bussing, DNP, AGPCNP-BC, AOCNP 10/14/21  Addendum I have seen the patient, examined her. I agree with the assessment and and plan and have edited the notes.   Pt is known to me and under my care for her early stage breast cancer and iron deficiency.  She was admitted for sepsis, the source of infection is not completely clear, ID on board, she had right axillary fluid collection aspiration by IR today, culture and cytology is still pending.  She had a CT chest, abdomen pelvis with contrast on admission which showed no evidence of recurrent breast cancer or definitive source of infection.  She is scheduled for IV iron in our office tomorrow, mild anemia stable,  will give Feraheme while she is in the hospital.  I will see her as needed, please do not hesitate to call us if anything else we can assist. I will f/u her in clinic.  Truitt Merle  10/14/2021

## 2021-10-15 ENCOUNTER — Ambulatory Visit: Payer: Medicare Other

## 2021-10-15 DIAGNOSIS — A419 Sepsis, unspecified organism: Secondary | ICD-10-CM | POA: Diagnosis not present

## 2021-10-15 DIAGNOSIS — R6520 Severe sepsis without septic shock: Secondary | ICD-10-CM | POA: Diagnosis not present

## 2021-10-15 DIAGNOSIS — R519 Headache, unspecified: Secondary | ICD-10-CM | POA: Diagnosis not present

## 2021-10-15 DIAGNOSIS — G9340 Encephalopathy, unspecified: Secondary | ICD-10-CM | POA: Diagnosis not present

## 2021-10-15 DIAGNOSIS — E119 Type 2 diabetes mellitus without complications: Secondary | ICD-10-CM | POA: Diagnosis not present

## 2021-10-15 DIAGNOSIS — C50411 Malignant neoplasm of upper-outer quadrant of right female breast: Secondary | ICD-10-CM | POA: Diagnosis not present

## 2021-10-15 LAB — GLUCOSE, CAPILLARY
Glucose-Capillary: 129 mg/dL — ABNORMAL HIGH (ref 70–99)
Glucose-Capillary: 122 mg/dL — ABNORMAL HIGH (ref 70–99)
Glucose-Capillary: 111 mg/dL — ABNORMAL HIGH (ref 70–99)
Glucose-Capillary: 122 mg/dL — ABNORMAL HIGH (ref 70–99)

## 2021-10-15 LAB — CBC WITH DIFFERENTIAL/PLATELET
Abs Immature Granulocytes: 0.02 10*3/uL (ref 0.00–0.07)
Basophils Absolute: 0 10*3/uL (ref 0.0–0.1)
Basophils Relative: 0 %
Eosinophils Absolute: 0.1 10*3/uL (ref 0.0–0.5)
Eosinophils Relative: 1 %
HCT: 30.4 % — ABNORMAL LOW (ref 36.0–46.0)
Hemoglobin: 9.6 g/dL — ABNORMAL LOW (ref 12.0–15.0)
Immature Granulocytes: 0 %
Lymphocytes Relative: 19 %
Lymphs Abs: 1.2 10*3/uL (ref 0.7–4.0)
MCH: 28.5 pg (ref 26.0–34.0)
MCHC: 31.6 g/dL (ref 30.0–36.0)
MCV: 90.2 fL (ref 80.0–100.0)
Monocytes Absolute: 0.6 10*3/uL (ref 0.1–1.0)
Monocytes Relative: 9 %
Neutro Abs: 4.4 10*3/uL (ref 1.7–7.7)
Neutrophils Relative %: 71 %
Platelets: 156 10*3/uL (ref 150–400)
RBC: 3.37 MIL/uL — ABNORMAL LOW (ref 3.87–5.11)
RDW: 17.4 % — ABNORMAL HIGH (ref 11.5–15.5)
WBC: 6.2 10*3/uL (ref 4.0–10.5)
nRBC: 0 % (ref 0.0–0.2)

## 2021-10-15 LAB — BASIC METABOLIC PANEL
Anion gap: 8 (ref 5–15)
BUN: 9 mg/dL (ref 8–23)
CO2: 24 mmol/L (ref 22–32)
Calcium: 8.3 mg/dL — ABNORMAL LOW (ref 8.9–10.3)
Chloride: 105 mmol/L (ref 98–111)
Creatinine, Ser: 0.73 mg/dL (ref 0.44–1.00)
GFR, Estimated: >60 mL/min (ref >60)
Glucose, Bld: 148 mg/dL — ABNORMAL HIGH (ref 70–99)
Potassium: 3.8 mmol/L (ref 3.5–5.1)
Sodium: 137 mmol/L (ref 135–145)

## 2021-10-15 LAB — CYTOLOGY - NON PAP

## 2021-10-15 LAB — PHOSPHORUS: Phosphorus: 3.4 mg/dL (ref 2.5–4.6)

## 2021-10-15 MED ORDER — DIPHENHYDRAMINE HCL 50 MG/ML IJ SOLN
INTRAMUSCULAR | Status: AC
  Filled 2021-10-15: qty 1

## 2021-10-15 MED ORDER — METOCLOPRAMIDE HCL 5 MG/ML IJ SOLN
5.0000 mg | Freq: Once | INTRAMUSCULAR | Status: AC
  Administered 2021-10-15: 5 mg via INTRAVENOUS
  Filled 2021-10-15: qty 2

## 2021-10-15 MED ORDER — VENLAFAXINE HCL ER 37.5 MG PO CP24
37.5000 mg | ORAL_CAPSULE | Freq: Every day | ORAL | Status: DC
  Administered 2021-10-15 – 2021-10-16 (×2): 37.5 mg via ORAL
  Filled 2021-10-15 (×3): qty 1

## 2021-10-15 MED ORDER — KETOROLAC TROMETHAMINE 15 MG/ML IJ SOLN
15.0000 mg | Freq: Once | INTRAMUSCULAR | Status: AC
  Administered 2021-10-15: 15 mg via INTRAVENOUS
  Filled 2021-10-15: qty 1

## 2021-10-15 MED ORDER — IBUPROFEN 400 MG PO TABS
ORAL_TABLET | ORAL | Status: AC
  Filled 2021-10-15: qty 1

## 2021-10-15 MED ORDER — METOCLOPRAMIDE HCL 5 MG/ML IJ SOLN
INTRAMUSCULAR | Status: AC
  Filled 2021-10-15: qty 2

## 2021-10-15 MED ORDER — VENLAFAXINE HCL ER 75 MG PO CP24
75.0000 mg | ORAL_CAPSULE | Freq: Every day | ORAL | Status: DC
  Administered 2021-10-15 – 2021-10-16 (×2): 75 mg via ORAL
  Filled 2021-10-15: qty 1

## 2021-10-15 MED ORDER — METOCLOPRAMIDE HCL 5 MG/ML IJ SOLN
5.0000 mg | INTRAMUSCULAR | Status: AC
  Administered 2021-10-15: 5 mg via INTRAVENOUS

## 2021-10-15 MED ORDER — IBUPROFEN 400 MG PO TABS
400.0000 mg | ORAL_TABLET | Freq: Four times a day (QID) | ORAL | Status: DC | PRN
  Administered 2021-10-15 – 2021-10-16 (×4): 400 mg via ORAL
  Filled 2021-10-15 (×2): qty 1

## 2021-10-15 MED ORDER — DIPHENHYDRAMINE HCL 50 MG/ML IJ SOLN
12.5000 mg | INTRAMUSCULAR | Status: AC
  Administered 2021-10-15: 12.5 mg via INTRAVENOUS

## 2021-10-15 MED ORDER — KETOROLAC TROMETHAMINE 15 MG/ML IJ SOLN
15.0000 mg | INTRAMUSCULAR | Status: AC
  Administered 2021-10-15: 15 mg via INTRAVENOUS

## 2021-10-15 NOTE — Progress Notes (Addendum)
PROGRESS NOTE  Kayla Bennett HGD:[id number] DOB: 08-28-1951 DOA: 10/12/2021 PCP: Truitt Merle, MD  HPI/Recap of past 24 hours: Kayla Bennett is a 70 y.o. female with medical history significant for chronic iron deficiency anemia Baseline hemoglobin 9-11, paroxysmal A. fib on Eliquis, type 2 diabetes, psoriasis stage I right-sided breast cancer diagnosed in January 2022, completed radiation therapy and is on tamoxifen, who was admitted to [facility] on 10/12/2021 with sepsis of unclear source after presenting from home with fevers for 2 days.  Associated with generalized fatigue, weakness, and intermittent headache.  Upon presentation to the ED, she is febrile with T-max 103.1, negative UA and negative chest x-ray.  ID consulted, pan Ct'd.  CT chest showed right axillary fluid collection.  MRI brain with and without contrast was negative for any acute intracranial findings.  Seen by IR for aspiration of right axillary fluid collection, completed on 10/14/2021.    10/15/2021: Patient was seen and examined at bedside.  She reports the worst headache of her life.  Headache cocktail ordered.  She takes Excedrin or sumatriptan at home for headaches.   Assessment/Plan: Principal Problem:   Sepsis ([place]) Active Problems:   Malignant neoplasm of upper-outer quadrant of right breast in female, estrogen receptor positive (HCC)   Persistent atrial fibrillation (HCC)   Iron deficiency anemia   Fever   Nausea   Headache   Generalized weakness   Diabetes ([place])   Hypertension   GERD (gastroesophageal reflux disease)  Sepsis of unknown source Presented with 2 days of fever generalized fatigue and weakness T-max 103.1 in the ED. Respiration rate 30. UA and chest x-ray negative CT chest/abdomen pelvis with contrast revealed fluid collection in the right axilla and posterior right breast question postoperative such as seroma, hematoma or lymphocele, infection not excluded by CT. Currently on  Rocephin and IV vancomycin empirically Continue to follow blood cultures and body fluid culture. Appreciate infectious disease assistance. Monitor fever curve and WBC  Stage I right breast cancer, follows with medical oncology Dr. Burr Medico Post radiation, currently on tamoxifen Medical oncology Dr. Burr Medico, following, appreciate assistance.  Intermittent headache Follow MRI brain with and without contrast negative for any acute intracranial findings.   Continue symptomatic management Headache cocktail ordered on 10/15/2021 Advil as needed for headache  Paroxysmal A. fib on Eliquis Rate controlled on Toprol-XL. Continue Eliquis for CVA prevention Continue to monitor on telemetry.  Chronic anxiety/depression Continue home Effexor  Type 2 diabetes with diabetic polyneuropathy Last hemoglobin A1c 6.7 on 02/13/2021. Continue insulin sliding scale Continue gabapentin Continue to avoid hypoglycemia  GERD Continue home PPI twice daily.  Iron deficiency anemia/anemia of chronic disease IV Feraheme infusion completed on 10/14/2021. Continue to monitor H&H  Resolved Hypophosphatemia Repleted orally Serum phosphorus 3.4 from 2.1.    Code Status: Full code  Family Communication: None at bedside  Disposition Plan: Likely will discharge to home once infectious disease signs off.   Consultants: Infectious disease Medical oncology  Procedures: None  Antimicrobials: Rocephin IV vancomycin DC'd on 10/13/2021  DVT prophylaxis: Eliquis  Status is: Inpatient  Inpatient status.  Patient requires at least 2 midnights for further evaluation and treatment of present condition.        Objective: Vitals:   10/15/21 0334 10/15/21 0428 10/15/21 0729 10/15/21 1117  BP:  (!) 152/81 132/77 (!) 153/70  Pulse:   88 80  Resp:  20 18 18   Temp:  98.7 F (37.1 C) 98.7 F (37.1 C) 98.1 F (36.7  C)  TempSrc:  Oral Oral Axillary  SpO2:  99% 99% 99%  Weight: 93 kg        Intake/Output Summary (Last 24 hours) at 10/15/2021 1546 Last data filed at 10/15/2021 1400 Gross per 24 hour  Intake 2375.24 ml  Output --  Net 2375.24 ml   Filed Weights   10/12/21 2000 10/14/21 0413 10/15/21 0334  Weight: 90.7 kg 93.1 kg 93 kg    Exam:  General: 70 y.o. year-old female well-developed well-nourished in no acute distress.  She is alert oriented x3.   Cardiovascular: Regular rate and rhythm no rubs or gallops. Respiratory: Clear to auscultation with no wheezes or rales. Abdomen: Soft nontender normal bowel sounds present. Musculoskeletal: No lower extremity edema bilaterally.  Tenderness noted with palpation of right axilla. Skin: Mild erythema noted right axilla.   Psychiatry: Mood is appropriate for condition and setting.   Data Reviewed: CBC: Recent Labs  Lab 10/12/21 1639 10/13/21 0548 10/14/21 0219 10/15/21 0601  WBC 11.7* 11.6* 7.8 6.2  NEUTROABS 8.5* 8.5* 5.4 4.4  HGB 11.3* 10.7* 10.4* 9.6*  HCT 36.1 34.4* 33.3* 30.4*  MCV 89.6 90.5 91.2 90.2  PLT 173 152 154 761   Basic Metabolic Panel: Recent Labs  Lab 10/12/21 1413 10/13/21 0548 10/14/21 0219 10/15/21 0601  NA 135 135 138 137  K 3.9 3.1* 3.7 3.8  CL 101 103 105 105  CO2 24 22 26 24   GLUCOSE 153* 174* 137* 148*  BUN 12 11 8 9   CREATININE 0.82 0.82 0.71 0.73  CALCIUM 8.8* 7.8* 8.0* 8.3*  MG  --  1.5*  1.5* 2.1  --   PHOS  --   --  2.1* 3.4   GFR: Estimated Creatinine Clearance: 69.5 mL/min (by C-G formula based on SCr of 0.73 mg/dL). Liver Function Tests: Recent Labs  Lab 10/13/21 0548 10/14/21 0219  AST 26  26 23   ALT 19  19 24   ALKPHOS 49  50 47  BILITOT 0.8  0.9 0.4  PROT 6.1*  6.0* 6.1*  ALBUMIN 2.8*  2.8* 2.7*   No results for input(s): LIPASE, AMYLASE in the last 168 hours. No results for input(s): AMMONIA in the last 168 hours. Coagulation Profile: No results for input(s): INR, PROTIME in the last 168 hours. Cardiac Enzymes: No results for  input(s): CKTOTAL, CKMB, CKMBINDEX, TROPONINI in the last 168 hours. BNP (last 3 results) No results for input(s): PROBNP in the last 8760 hours. HbA1C: No results for input(s): HGBA1C in the last 72 hours. CBG: Recent Labs  Lab 10/14/21 1157 10/14/21 1552 10/14/21 1932 10/15/21 0728 10/15/21 1115  GLUCAP 147* 135* 173* 129* 122*   Lipid Profile: No results for input(s): CHOL, HDL, LDLCALC, TRIG, CHOLHDL, LDLDIRECT in the last 72 hours. Thyroid Function Tests: Recent Labs    10/13/21 0602  TSH 2.875   Anemia Panel: No results for input(s): VITAMINB12, FOLATE, FERRITIN, TIBC, IRON, RETICCTPCT in the last 72 hours. Urine analysis:    Component Value Date/Time   COLORURINE YELLOW 10/12/2021 1946   APPEARANCEUR CLEAR 10/12/2021 1946   LABSPEC 1.008 10/12/2021 1946   PHURINE 6.0 10/12/2021 1946   GLUCOSEU NEGATIVE 10/12/2021 1946   HGBUR NEGATIVE 10/12/2021 1946   BILIRUBINUR NEGATIVE 10/12/2021 1946   KETONESUR NEGATIVE 10/12/2021 1946   PROTEINUR NEGATIVE 10/12/2021 1946   NITRITE NEGATIVE 10/12/2021 1946   LEUKOCYTESUR NEGATIVE 10/12/2021 1946   Sepsis Labs: @LABRCNTIP (procalcitonin:4,lacticidven:4)  ) Recent Results (from the past 240 hour(s))  Resp Panel by RT-PCR (Flu  A&B, Covid) Nasopharyngeal Swab     Status: None   Collection Time: 10/12/21  2:13 PM   Specimen: Nasopharyngeal Swab; Nasopharyngeal(NP) swabs in vial transport medium  Result Value Ref Range Status   SARS Coronavirus 2 by RT PCR NEGATIVE NEGATIVE Final    Comment: (NOTE) SARS-CoV-2 target nucleic acids are NOT DETECTED.  The SARS-CoV-2 RNA is generally detectable in upper respiratory specimens during the acute phase of infection. The lowest concentration of SARS-CoV-2 viral copies this assay can detect is 138 copies/mL. A negative result does not preclude SARS-Cov-2 infection and should not be used as the sole basis for treatment or other patient management decisions. A negative result may  occur with  improper specimen collection/handling, submission of specimen other than nasopharyngeal swab, presence of viral mutation(s) within the areas targeted by this assay, and inadequate number of viral copies(<138 copies/mL). A negative result must be combined with clinical observations, patient history, and epidemiological information. The expected result is Negative.  Fact Sheet for Patients:  EntrepreneurPulse.com.au  Fact Sheet for Healthcare Providers:  IncredibleEmployment.be  This test is no t yet approved or cleared by the [place] FDA and  has been authorized for detection and/or diagnosis of SARS-CoV-2 by FDA under an Emergency Use Authorization (EUA). This EUA will remain  in effect (meaning this test can be used) for the duration of the COVID-19 declaration under Section 564(b)(1) of the Act, 21 U.S.C.section 360bbb-3(b)(1), unless the authorization is terminated  or revoked sooner.       Influenza A by PCR NEGATIVE NEGATIVE Final   Influenza B by PCR NEGATIVE NEGATIVE Final    Comment: (NOTE) The Xpert Xpress SARS-CoV-2/FLU/RSV plus assay is intended as an aid in the diagnosis of influenza from Nasopharyngeal swab specimens and should not be used as a sole basis for treatment. Nasal washings and aspirates are unacceptable for Xpert Xpress SARS-CoV-2/FLU/RSV testing.  Fact Sheet for Patients: EntrepreneurPulse.com.au  Fact Sheet for Healthcare Providers: IncredibleEmployment.be  This test is not yet approved or cleared by the [place] FDA and has been authorized for detection and/or diagnosis of SARS-CoV-2 by FDA under an Emergency Use Authorization (EUA). This EUA will remain in effect (meaning this test can be used) for the duration of the COVID-19 declaration under Section 564(b)(1) of the Act, 21 U.S.C. section 360bbb-3(b)(1), unless the authorization is terminated  or revoked.  Performed at [facility], [place] [address], [place], [place] [address]   Blood culture (routine x 2)     Status: None (Preliminary result)   Collection Time: 10/12/21  4:39 PM   Specimen: BLOOD LEFT HAND  Result Value Ref Range Status   Specimen Description BLOOD LEFT HAND  Final   Special Requests   Final    BOTTLES DRAWN AEROBIC AND ANAEROBIC Blood Culture results may not be optimal due to an inadequate volume of blood received in culture bottles   Culture   Final    NO GROWTH 3 DAYS Performed at [facility], [place] [address] [place], [place] [address]    Report Status PENDING  Incomplete  Blood culture (routine x 2)     Status: None (Preliminary result)   Collection Time: 10/13/21  5:48 AM   Specimen: BLOOD LEFT HAND  Result Value Ref Range Status   Specimen Description BLOOD LEFT HAND  Final   Special Requests AEROBIC BOTTLE ONLY Blood Culture adequate volume  Final   Culture   Final    NO GROWTH 2 DAYS  Performed at [facility], [place] [address] [place], [place] [address]    Report Status PENDING  Incomplete  MRSA Next Gen by PCR, Nasal     Status: None   Collection Time: 10/14/21  9:24 AM   Specimen: Nasal Mucosa; Nasal Swab  Result Value Ref Range Status   MRSA by PCR Next Gen NOT DETECTED NOT DETECTED Final    Comment: (NOTE) The GeneXpert MRSA Assay (FDA approved for NASAL specimens only), is one component of a comprehensive MRSA colonization surveillance program. It is not intended to diagnose MRSA infection nor to guide or monitor treatment for MRSA infections. Test performance is not FDA approved in patients less than 56 years old. Performed at [facility], [place] [address], [place], [place] [address]   Aerobic/Anaerobic Culture w Gram Stain (surgical/deep wound)     Status: None (Preliminary result)   Collection Time: 10/14/21 11:57 AM   Specimen: Wound; Serous Fluid  Result Value Ref Range Status   Specimen Description  WOUND RIGHT AXILLA  Final   Special Requests ASPIRATE  Final   Gram Stain   Final    FEW WBC PRESENT, PREDOMINANTLY MONONUCLEAR NO ORGANISMS SEEN    Culture   Final    NO GROWTH < 24 HOURS Performed at [facility], [address], [place], [place] [address]    Report Status PENDING  Incomplete      Studies: No results found.  Scheduled Meds:  apixaban  5 mg Oral BID   gabapentin  200 mg Oral QHS   losartan  100 mg Oral Daily   And   hydrochlorothiazide  25 mg Oral Daily   insulin aspart  0-6 Units Subcutaneous TID WC   loratadine  10 mg Oral Daily   metoprolol succinate  25 mg Oral Daily   pantoprazole  40 mg Oral BID AC   potassium & sodium phosphates  1 packet Oral TID WC & HS   senna-docusate  2 tablet Oral Q0600   tamoxifen  20 mg Oral Daily   venlafaxine XR  75 mg Oral QHS   And   venlafaxine XR  37.5 mg Oral QHS    Continuous Infusions:  cefTRIAXone (ROCEPHIN)  IV 2 g (10/14/21 1744)   lactated ringers 50 mL/hr at 10/15/21 1301   vancomycin 1,000 mg (10/15/21 0956)     LOS: 3 days     Kayleen Memos, MD [facility] Pager [phone]  If 7PM-7AM, please contact night-coverage [email or web address] Password [facility] 10/15/2021, 3:46 PM

## 2021-10-15 NOTE — Progress Notes (Signed)
Subjective:  Worsening headache also low-grade fever last night   Antibiotics:  Anti-infectives (From admission, onward)    Start     Dose/Rate Route Frequency Ordered Stop   10/14/21 1800  cefTRIAXone (ROCEPHIN) 2 g in sodium chloride 0.9 % 100 mL IVPB        2 g 200 mL/hr over 30 Minutes Intravenous Every 24 hours 10/14/21 1014     10/14/21 0700  vancomycin (VANCOREADY) IVPB 1000 mg/200 mL        1,000 mg 200 mL/hr over 60 Minutes Intravenous Daily 10/14/21 0612     10/13/21 2300  vancomycin (VANCOREADY) IVPB 1000 mg/200 mL  Status:  Discontinued        1,000 mg 200 mL/hr over 60 Minutes Intravenous Every 24 hours 10/12/21 2025 10/13/21 1022   10/13/21 1800  cefTRIAXone (ROCEPHIN) 1 g in sodium chloride 0.9 % 100 mL IVPB  Status:  Discontinued        1 g 200 mL/hr over 30 Minutes Intravenous Every 24 hours 10/12/21 1947 10/14/21 1014   10/12/21 2015  vancomycin (VANCOREADY) IVPB 1750 mg/350 mL        1,750 mg 175 mL/hr over 120 Minutes Intravenous  Once 10/12/21 2012 10/12/21 2325   10/12/21 1900  cefTRIAXone (ROCEPHIN) 2 g in sodium chloride 0.9 % 100 mL IVPB        2 g 200 mL/hr over 30 Minutes Intravenous  Once 10/12/21 1859 10/12/21 1930   10/12/21 1900  fluconazole (DIFLUCAN) tablet 200 mg        200 mg Oral  Once 10/12/21 1859 10/12/21 1945       Medications: Scheduled Meds:  apixaban  5 mg Oral BID   gabapentin  200 mg Oral QHS   losartan  100 mg Oral Daily   And   hydrochlorothiazide  25 mg Oral Daily   insulin aspart  0-6 Units Subcutaneous TID WC   loratadine  10 mg Oral Daily   metoprolol succinate  25 mg Oral Daily   pantoprazole  40 mg Oral BID AC   potassium & sodium phosphates  1 packet Oral TID WC & HS   senna-docusate  2 tablet Oral Q0600   tamoxifen  20 mg Oral Daily   venlafaxine XR  75 mg Oral QHS   And   venlafaxine XR  37.5 mg Oral QHS   Continuous Infusions:  cefTRIAXone (ROCEPHIN)  IV 2 g (10/14/21 1744)   lactated ringers  50 mL/hr at 10/15/21 1301   vancomycin 1,000 mg (10/15/21 0956)   PRN Meds:.acetaminophen **OR** acetaminophen, albuterol, HYDROcodone-acetaminophen, ibuprofen, ketorolac, ondansetron (ZOFRAN) IV    Objective: Weight change: -0.124 kg  Intake/Output Summary (Last 24 hours) at 10/15/2021 1519 Last data filed at 10/15/2021 1400 Gross per 24 hour  Intake 2375.24 ml  Output --  Net 2375.24 ml    Blood pressure (!) 153/70, pulse 80, temperature 98.1 F (36.7 C), temperature source Axillary, resp. rate 18, weight 93 kg, SpO2 99 %. Temp:  [98.1 F (36.7 C)-99.7 F (37.6 C)] 98.1 F (36.7 C) (11/29 1117) Pulse Rate:  [80-94] 80 (11/29 1117) Resp:  [16-20] 18 (11/29 1117) BP: (117-153)/(65-81) 153/70 (11/29 1117) SpO2:  [99 %-100 %] 99 % (11/29 1117) Weight:  [93 kg] 93 kg (11/29 0334)  Physical Exam: Physical Exam Constitutional:      General: She is not in acute distress.    Appearance: She is well-developed. She is not diaphoretic.  HENT:  Head: Normocephalic and atraumatic.     Right Ear: External ear normal.     Left Ear: External ear normal.     Mouth/Throat:     Pharynx: No oropharyngeal exudate.  Eyes:     General: No scleral icterus.    Conjunctiva/sclera: Conjunctivae normal.     Pupils: Pupils are equal, round, and reactive to light.  Cardiovascular:     Rate and Rhythm: Normal rate and regular rhythm.     Heart sounds:    Friction rub present.  Pulmonary:     Effort: Pulmonary effort is normal. No respiratory distress.     Breath sounds: No wheezing or rales.  Abdominal:     General: There is no distension.     Palpations: Abdomen is soft.  Musculoskeletal:        General: No tenderness. Normal range of motion.  Lymphadenopathy:     Cervical: No cervical adenopathy.  Skin:    General: Skin is warm and dry.     Coloration: Skin is not pale.     Findings: No erythema or rash.  Neurological:     General: No focal deficit present.     Mental Status:  She is alert and oriented to person, place, and time.     Motor: No abnormal muscle tone.     Coordination: Coordination normal.  Psychiatric:        Attention and Perception: Attention and perception normal.        Mood and Affect: Mood is depressed.        Behavior: Behavior normal.        Thought Content: Thought content normal.        Cognition and Memory: Cognition normal.        Judgment: Judgment normal.    Right axillary cystic area not as tender as when I examined her yesterday breast cyst also not as prominent  CBC:    BMET Recent Labs    10/14/21 0219 10/15/21 0601  NA 138 137  K 3.7 3.8  CL 105 105  CO2 26 24  GLUCOSE 137* 148*  BUN 8 9  CREATININE 0.71 0.73  CALCIUM 8.0* 8.3*      Liver Panel  Recent Labs    10/13/21 0548 10/14/21 0219  PROT 6.1*  6.0* 6.1*  ALBUMIN 2.8*  2.8* 2.7*  AST 26  26 23   ALT 19  19 24   ALKPHOS 49  50 47  BILITOT 0.8  0.9 0.4  BILIDIR 0.2  --   IBILI 0.7  --         Sedimentation Rate Recent Labs    10/13/21 0548  ESRSEDRATE 40*    C-Reactive Protein Recent Labs    10/13/21 0548  CRP 18.5*     Micro Results: Recent Results (from the past 720 hour(s))  Resp Panel by RT-PCR (Flu A&B, Covid) Nasopharyngeal Swab     Status: None   Collection Time: 10/12/21  2:13 PM   Specimen: Nasopharyngeal Swab; Nasopharyngeal(NP) swabs in vial transport medium  Result Value Ref Range Status   SARS Coronavirus 2 by RT PCR NEGATIVE NEGATIVE Final    Comment: (NOTE) SARS-CoV-2 target nucleic acids are NOT DETECTED.  The SARS-CoV-2 RNA is generally detectable in upper respiratory specimens during the acute phase of infection. The lowest concentration of SARS-CoV-2 viral copies this assay can detect is 138 copies/mL. A negative result does not preclude SARS-Cov-2 infection and should not be used as the sole basis for treatment  or other patient management decisions. A negative result may occur with  improper  specimen collection/handling, submission of specimen other than nasopharyngeal swab, presence of viral mutation(s) within the areas targeted by this assay, and inadequate number of viral copies(<138 copies/mL). A negative result must be combined with clinical observations, patient history, and epidemiological information. The expected result is Negative.  Fact Sheet for Patients:  EntrepreneurPulse.com.au  Fact Sheet for Healthcare Providers:  IncredibleEmployment.be  This test is no t yet approved or cleared by the [place] FDA and  has been authorized for detection and/or diagnosis of SARS-CoV-2 by FDA under an Emergency Use Authorization (EUA). This EUA will remain  in effect (meaning this test can be used) for the duration of the COVID-19 declaration under Section 564(b)(1) of the Act, 21 U.S.C.section 360bbb-3(b)(1), unless the authorization is terminated  or revoked sooner.       Influenza A by PCR NEGATIVE NEGATIVE Final   Influenza B by PCR NEGATIVE NEGATIVE Final    Comment: (NOTE) The Xpert Xpress SARS-CoV-2/FLU/RSV plus assay is intended as an aid in the diagnosis of influenza from Nasopharyngeal swab specimens and should not be used as a sole basis for treatment. Nasal washings and aspirates are unacceptable for Xpert Xpress SARS-CoV-2/FLU/RSV testing.  Fact Sheet for Patients: EntrepreneurPulse.com.au  Fact Sheet for Healthcare Providers: IncredibleEmployment.be  This test is not yet approved or cleared by the [place] FDA and has been authorized for detection and/or diagnosis of SARS-CoV-2 by FDA under an Emergency Use Authorization (EUA). This EUA will remain in effect (meaning this test can be used) for the duration of the COVID-19 declaration under Section 564(b)(1) of the Act, 21 U.S.C. section 360bbb-3(b)(1), unless the authorization is terminated or revoked.  Performed at  [facility], [place] [address], [place], [place] [address]   Blood culture (routine x 2)     Status: None (Preliminary result)   Collection Time: 10/12/21  4:39 PM   Specimen: BLOOD LEFT HAND  Result Value Ref Range Status   Specimen Description BLOOD LEFT HAND  Final   Special Requests   Final    BOTTLES DRAWN AEROBIC AND ANAEROBIC Blood Culture results may not be optimal due to an inadequate volume of blood received in culture bottles   Culture   Final    NO GROWTH 3 DAYS Performed at [facility], [place] [address] [place], [place] [address]    Report Status PENDING  Incomplete  Blood culture (routine x 2)     Status: None (Preliminary result)   Collection Time: 10/13/21  5:48 AM   Specimen: BLOOD LEFT HAND  Result Value Ref Range Status   Specimen Description BLOOD LEFT HAND  Final   Special Requests AEROBIC BOTTLE ONLY Blood Culture adequate volume  Final   Culture   Final    NO GROWTH 2 DAYS Performed at [facility], [place] [address] [place],  [address]    Report Status PENDING  Incomplete  MRSA Next Gen by PCR, Nasal     Status: None   Collection Time: 10/14/21  9:24 AM   Specimen: Nasal Mucosa; Nasal Swab  Result Value Ref Range Status   MRSA by PCR Next Gen NOT DETECTED NOT DETECTED Final    Comment: (NOTE) The GeneXpert MRSA Assay (FDA approved for NASAL specimens only), is one component of a comprehensive MRSA colonization surveillance program. It is not intended to diagnose MRSA infection nor to guide or monitor treatment for MRSA  infections. Test performance is not FDA approved in patients less than 50 years old. Performed at [facility], [place] [address] [place], [place] [address]   Aerobic/Anaerobic Culture w Gram Stain (surgical/deep wound)     Status: None (Preliminary result)   Collection Time: 10/14/21 11:57 AM   Specimen: Wound; Serous Fluid  Result Value Ref Range Status   Specimen Description WOUND RIGHT AXILLA  Final    Special Requests ASPIRATE  Final   Gram Stain   Final    FEW WBC PRESENT, PREDOMINANTLY MONONUCLEAR NO ORGANISMS SEEN    Culture   Final    NO GROWTH < 24 HOURS Performed at [facility], [address], [place], [place] [address]    Report Status PENDING  Incomplete    Studies/Results: MR BRAIN W WO CONTRAST  Result Date: 10/13/2021 CLINICAL DATA:  Headache, new or worsening EXAM: MRI HEAD WITHOUT AND WITH CONTRAST TECHNIQUE: Multiplanar, multiecho pulse sequences of the brain and surrounding structures were obtained without and with intravenous contrast. CONTRAST:  87mL GADAVIST GADOBUTROL 1 MMOL/ML IV SOLN COMPARISON:  None. FINDINGS: Brain: No restricted diffusion to suggest acute or subacute infarct. No acute hemorrhage, hydrocephalus, extra-axial collection, or mass lesion. The ventricles and sulci are within normal limits for age. No abnormal enhancement. Vascular: Normal flow voids. Skull and upper cervical spine: Normal marrow signal. Sinuses/Orbits: Mucosal thickening in the right maxillary sinus. The orbits are unremarkable. Other: Trace fluid in left mastoid air cells. IMPRESSION: No acute intracranial process. No etiology is seen for the patient's headache. Electronically Signed   By: Kayla Bennett M.D.   On: 10/13/2021 20:46   [place] IMAGE GUIDED DRAINAGE BY PERCUTANEOUS CATHETER  Result Date: 10/14/2021 INDICATION: Infection, unknown etiology. RIGHT breast and axillary collections on CT. EXAM: US-GUIDED DRAINAGE OF RIGHT AXILLARY FLUID COLLECTION COMPARISON:  CT CAP, 10/13/2021. MEDICATIONS: The patient is currently admitted to the hospital and receiving intravenous antibiotics. The antibiotics were administered within an appropriate time frame prior to the initiation of the procedure. ANESTHESIA/SEDATION: Local anesthetic was administered. CONTRAST:  None COMPLICATIONS: None immediate. PROCEDURE: Informed written consent was obtained from the patient after a discussion of the  risks, benefits and alternatives to treatment. Preprocedural ultrasound scanning demonstrated well-circumscribed, mildly complex RIGHT axillary fluid collection. A timeout was performed prior to the initiation of the procedure. The RIGHT chest and axilla was prepped and draped in the usual sterile fashion. The overlying soft tissues were anesthetized with 1% lidocaine with epinephrine. Under direct ultrasound guidance, a 18 gauge 10 cm Yueh needle was advanced into the abscess/fluid collection. Multiple ultrasound images were saved for procedural documentation purposes. Next, approximately 5 mL serous fluid was aspirated from the collection. A representative sample of aspirated fluid was capped and sent to the laboratory for analysis. The needle was removed and superficial hemostasis was achieved with manual compression. A dressing was placed. The patient tolerated the procedure well without immediate postprocedural complication. IMPRESSION: Successful US-guided aspiration of RIGHT axillary fluid collection. A representative aspirated sample was sent to the laboratory as requested by the ordering clinical team. Kayla Birks, MD [facility] Electronically Signed   By: Kayla Bennett M.D.   On: 10/14/2021 15:40      Assessment/Plan:  INTERVAL HISTORY: Cultures so far unrevealing from blood work from cyst  Principal Problem:   Sepsis ([place]) Active Problems:   Malignant neoplasm of upper-outer quadrant of right breast in female, estrogen receptor positive ([place])  Persistent atrial fibrillation ([facility])   Iron deficiency anemia   Fever   Nausea   Headache   Generalized weakness   Diabetes ([place])   Hypertension   GERD (gastroesophageal reflux disease)    Kayla Bennett is a 70 y.o. female with history of breast cancer status postlumpectomy and axillary lymph node dissection radiation who had recent cardioversion for very difficult to treat  atrial fibrillation.  She then developed fevers with a severe headache chills numbness in the tips of the fingers myalgias and was admitted to the hospital with apparent sepsis and encephalopathy.  She had had some neck stiffness as well.  When I talked to her and examined her initially she had exquisite pain and tenderness at the 2 cystic sites from where she had had surgery 1 in her axilla and 1 involving her breast tissue.  I have suspected that her source of infection lies in these cysts that have become acutely inflamed and tender.  We obtained an MRI of the brain to ensure there was no evidence of meningitis or encephalitis and there is none.  Will continue ceftriaxone and vancomycin pending culture data from IR guided aspirate which was performed yesterday.  I am bothered by her having worsening headache today though overall she seems improved.  The other question is if any further attention needs to be given to the cyst in the axilla that was not drained.  I spent 36 minutes with the patient including  face to face counseling of the patient re her workup for fever, sepsis, personally reviewing her updated blood cultures cyst cultures CBC BMP, MRI of the brain along with review of medical records in preparation for the visit and during the visit and in coordination of her care.    LOS: 3 days   Alcide Evener 10/15/2021, 3:19 PM

## 2021-10-15 NOTE — Care Management Important Message (Signed)
Important Message  Patient Details  Name: Kayla Bennett MRN: [id number] Date of Birth: 07-Sep-1951   Medicare Important Message Given:  Yes     Aniello Christopoulos 10/15/2021, 2:19 PM

## 2021-10-15 NOTE — Plan of Care (Signed)

## 2021-10-16 DIAGNOSIS — R509 Fever, unspecified: Secondary | ICD-10-CM | POA: Diagnosis not present

## 2021-10-16 DIAGNOSIS — N611 Abscess of the breast and nipple: Secondary | ICD-10-CM

## 2021-10-16 DIAGNOSIS — R519 Headache, unspecified: Secondary | ICD-10-CM | POA: Diagnosis not present

## 2021-10-16 DIAGNOSIS — R6520 Severe sepsis without septic shock: Secondary | ICD-10-CM | POA: Diagnosis not present

## 2021-10-16 DIAGNOSIS — A419 Sepsis, unspecified organism: Secondary | ICD-10-CM | POA: Diagnosis not present

## 2021-10-16 DIAGNOSIS — C50411 Malignant neoplasm of upper-outer quadrant of right female breast: Secondary | ICD-10-CM | POA: Diagnosis not present

## 2021-10-16 LAB — BASIC METABOLIC PANEL
Anion gap: 8 (ref 5–15)
BUN: 9 mg/dL (ref 8–23)
CO2: 25 mmol/L (ref 22–32)
Calcium: 8.4 mg/dL — ABNORMAL LOW (ref 8.9–10.3)
Chloride: 103 mmol/L (ref 98–111)
Creatinine, Ser: 0.7 mg/dL (ref 0.44–1.00)
GFR, Estimated: >60 mL/min (ref >60)
Glucose, Bld: 123 mg/dL — ABNORMAL HIGH (ref 70–99)
Potassium: 3.6 mmol/L (ref 3.5–5.1)
Sodium: 136 mmol/L (ref 135–145)

## 2021-10-16 LAB — MAGNESIUM: Magnesium: 1.9 mg/dL (ref 1.7–2.4)

## 2021-10-16 LAB — CBC WITH DIFFERENTIAL/PLATELET
Abs Immature Granulocytes: 0.01 10*3/uL (ref 0.00–0.07)
Basophils Absolute: 0 10*3/uL (ref 0.0–0.1)
Basophils Relative: 1 %
Eosinophils Absolute: 0.2 10*3/uL (ref 0.0–0.5)
Eosinophils Relative: 3 %
HCT: 29.8 % — ABNORMAL LOW (ref 36.0–46.0)
Hemoglobin: 9.5 g/dL — ABNORMAL LOW (ref 12.0–15.0)
Immature Granulocytes: 0 %
Lymphocytes Relative: 20 %
Lymphs Abs: 1.1 10*3/uL (ref 0.7–4.0)
MCH: 29 pg (ref 26.0–34.0)
MCHC: 31.9 g/dL (ref 30.0–36.0)
MCV: 90.9 fL (ref 80.0–100.0)
Monocytes Absolute: 0.6 10*3/uL (ref 0.1–1.0)
Monocytes Relative: 12 %
Neutro Abs: 3.4 10*3/uL (ref 1.7–7.7)
Neutrophils Relative %: 64 %
Platelets: 158 10*3/uL (ref 150–400)
RBC: 3.28 MIL/uL — ABNORMAL LOW (ref 3.87–5.11)
RDW: 17.2 % — ABNORMAL HIGH (ref 11.5–15.5)
WBC: 5.3 10*3/uL (ref 4.0–10.5)
nRBC: 0 % (ref 0.0–0.2)

## 2021-10-16 LAB — GLUCOSE, CAPILLARY
Glucose-Capillary: 125 mg/dL — ABNORMAL HIGH (ref 70–99)
Glucose-Capillary: 131 mg/dL — ABNORMAL HIGH (ref 70–99)
Glucose-Capillary: 127 mg/dL — ABNORMAL HIGH (ref 70–99)
Glucose-Capillary: 120 mg/dL — ABNORMAL HIGH (ref 70–99)

## 2021-10-16 LAB — PHOSPHORUS: Phosphorus: 4.4 mg/dL (ref 2.5–4.6)

## 2021-10-16 MED ORDER — CEFDINIR 300 MG PO CAPS
300.0000 mg | ORAL_CAPSULE | Freq: Two times a day (BID) | ORAL | Status: DC
  Administered 2021-10-16 – 2021-10-17 (×3): 300 mg via ORAL
  Filled 2021-10-16 (×3): qty 1

## 2021-10-16 MED ORDER — DOXYCYCLINE HYCLATE 100 MG PO TABS
100.0000 mg | ORAL_TABLET | Freq: Two times a day (BID) | ORAL | Status: DC
  Administered 2021-10-16 – 2021-10-17 (×3): 100 mg via ORAL
  Filled 2021-10-16 (×3): qty 1

## 2021-10-16 NOTE — Progress Notes (Signed)
Discussed case with Dr. Barry Dienes. Our team would not recommend drainage of breast fluid collection at this time. Recommend 2 weeks of abx (cx's pending from R axillary fluid collection aspiration) > outpatient follow up US to re-eval > follow up in the office for a recheck. I will ask our office to send referral for [place] and to schedule follow up.  Alferd Apa, [facility] 11:37 AM, 10/16/2021

## 2021-10-16 NOTE — Consult Note (Signed)
Kayla Bennett [birth year]  [id number].    Requesting MD: Dr. Phillips Climes Chief Complaint/Reason for Consult: Fever, Right breast/axilla fluid collection in setting of prior lumpectomy  HPI: Kayla Bennett is a 70 y.o. female with a hx of R breast cancer s/p R lumpectomy and sentinel lymph node bx by Dr. Barry Dienes on 02/18/21 (margins negative, cT1cN0, her2 negative) and radiation, now on Tamoxifen who we were asked to see for right axillary/breast fluid collection noted on CT.  Patient reports that she saw Dr. Barry Dienes for some prominence just anterior/inferior to axillary incision back on 9/19.  At that time there was no pain.  She was referred to [facility] for right breast ultrasound and possible aspiration.  She reports that imaging was performed and it was felt no aspiration was needed at that time.  She reports approximately 1 month later, sometime in October she started having some pain/soreness in the right axilla/right breast.  No increased swelling or skin changes.  She reports on Friday, 11/25 she developed increased pain and swelling of the right axilla/right upper outer breast. Still no skin changes associated with this. Patient also reports associated fever, chills, fatigue, nausea, headache and dyspnea on exertion at that time.  She presented to the ED and was found to be febrile. TRH admitted. ID was consulted. She underwent work-up including MRI brain for HA that showed no acute intracranial process. Additionally she had a CT CAP which showed no acute intrathoracic, intra-abdominal or intrapelvic abnormalities but did show a 4.9 x 3.8 cm fluid collection of the right axilla and 2.9 x 2.7 cm fluid collection of the posterior right breast. She also UA negative. Bcx have been negative thus far. She underwent US guided aspiration of the right axillary fluid collection yielding 65mL serous fluid. Cx's pending from this and currently NGTD. She reports since this procedure her pain has  significantly improved and is now mild. She was last febrile on 11/27. WBC wnl. We were asked to see. She is currently on Rocephin/Vanc.   Other past Medical Hx: PAF on Eliquis (hx of cardioversion 9/28 and 11/3), DM2, B12/Iron deficiency anemia Past Surgical Hx: Abdominal hysterectomy, appendectomy, cholecystectomy, LOA  ROS: Review of Systems  Constitutional:  Positive for fever and malaise/fatigue. Negative for chills.  Cardiovascular:  Negative for chest pain.  Gastrointestinal:  Positive for nausea (resolved, tolerating regular diet). Negative for abdominal pain, constipation, diarrhea and vomiting.  Genitourinary:  Negative for dysuria, hematuria and urgency.  Skin:  Negative for itching and rash.  All other systems reviewed and are negative.  Family History  Problem Relation Age of Onset   Atrial fibrillation Mother    Heart attack Mother 46   Colon cancer Father 67   Heart attack Father 59   Heart attack Paternal Grandmother 16   Cancer Maternal Uncle        melanoma    Colon cancer Paternal Uncle    Colon cancer Cousin    Atrial fibrillation Cousin     Past Medical History:  Diagnosis Date   Anemia    Arthritis    Asthma    Breast cancer ([place])    Diabetes ([place])    Dyspnea    Dysrhythmia    afibb on occasion   Family history of adverse reaction to anesthesia    mother had reaction to versed...during surgery, stopped breathing   Fibromyalgia    GERD (gastroesophageal reflux disease)    Headache    History of hiatal  hernia    Hypertension    Migraines    Osteopenia    Psoriasis     Past Surgical History:  Procedure Laterality Date   ABDOMINAL HYSTERECTOMY     APPENDECTOMY     BREAST LUMPECTOMY WITH RADIOACTIVE SEED AND SENTINEL LYMPH NODE BIOPSY Right 02/18/2021   Procedure: RIGHT BREAST LUMPECTOMY WITH RADIOACTIVE SEED AND SENTINEL LYMPH NODE BIOPSY;  Surgeon: [name], MD;  Location: [facility];  Service: General;  Laterality: Right;   broken wrist      CARDIOVERSION N/A 04/04/2021   Procedure: CARDIOVERSION;  Surgeon: Sueanne Margarita, MD;  Location: [facility];  Service: Cardiovascular;  Laterality: N/A;   CARDIOVERSION N/A 08/14/2021   Procedure: CARDIOVERSION;  Surgeon: Geralynn Rile, MD;  Location: [facility];  Service: Cardiovascular;  Laterality: N/A;   CARDIOVERSION N/A 09/19/2021   Procedure: CARDIOVERSION;  Surgeon: Geralynn Rile, MD;  Location: [facility];  Service: Cardiovascular;  Laterality: N/A;   [place]     right wrist   HYSTERECTOMY ABDOMINAL WITH SALPINGO-OOPHORECTOMY     LYSIS OF ADHESION     SHOULDER SURGERY Left    TUBAL LIGATION      Social History:  reports that she has never smoked. She has never used smokeless tobacco. She reports that she does not drink alcohol and does not use drugs.  Allergies:  Allergies  Allergen Reactions   Lotensin [Benazepril Hcl] Swelling   Morphine Itching and Rash   Erythromycin Base Other (See Comments)    Unknown reaction   Hydrocodone Other (See Comments)    Unknown reaction     Other Swelling    Mustard - vaginal swelling/pain    Banana Swelling    vaginal swelling/pain   Chocolate Swelling    vaginal swelling/pain   Gluten Meal Diarrhea    Upset stomach    Tizanidine Other (See Comments)    Lowered blood pressure    Wound Dressing Adhesive Other (See Comments)    Skin burns, blisters     Medications Prior to Admission  Medication Sig Dispense Refill   acetaminophen (TYLENOL) 500 MG tablet Take 500 mg by mouth every 6 (six) hours as needed for moderate pain, headache or mild pain.     albuterol (VENTOLIN HFA) 108 (90 Base) MCG/ACT inhaler Inhale 1-2 puffs into the lungs every 6 (six) hours as needed for shortness of breath or wheezing (Asthma).     apixaban (ELIQUIS) 5 MG TABS tablet Take 1 tablet (5 mg total) by mouth 2 (two) times daily. Pt needs 90 day supply.  Please keep on  file till next refill. 180 tablet 3   calcipotriene (DOVONOX) 0.005 % cream Apply 1 application topically See admin instructions. Mix with fluorouracil 5% cream and apply topically twice daily for five days - as needed for precancerous on right cheek.     cetirizine (ZYRTEC) 10 MG tablet Take 10 mg by mouth in the morning.     cholecalciferol (VITAMIN D3) 25 MCG (1000 UNIT) tablet Take 1,000-2,000 Units by mouth See admin instructions. Take 1000 units in the morning and 2000 units at bedtime     ferrous sulfate 325 (65 FE) MG tablet Take 325 mg by mouth every morning.     fluorouracil (EFUDEX) 5 % cream Apply 1 application topically 2 (two) times daily as needed (precancerous spots on right cheek). Mix with calcipotriene 0.005% cream and apply topically twice daily  for five days - as needed for precancerous on right cheek.     gabapentin (NEURONTIN) 100 MG capsule Take 200 mg by mouth at bedtime.     ibuprofen (ADVIL) 200 MG tablet Take 400 mg by mouth every 4 (four) hours as needed for moderate pain or headache.     losartan-hydrochlorothiazide (HYZAAR) 100-25 MG tablet Take 1 tablet by mouth daily. 30 tablet 3   metFORMIN (GLUCOPHAGE-XR) 500 MG 24 hr tablet Take 500 mg by mouth daily with supper.     metoprolol succinate (TOPROL XL) 25 MG 24 hr tablet Take 1 tablet (25 mg total) by mouth daily.     pantoprazole (PROTONIX) 40 MG tablet Take 40 mg by mouth 2 (two) times daily before a meal.     potassium chloride SA (KLOR-CON M20) 20 MEQ tablet Take 1 tablet (20 mEq total) by mouth daily. 30 tablet 6   Prenatal Vit-Fe Fumarate-FA (PRENATAL VITAMIN PLUS LOW IRON PO) Take 1 tablet by mouth in the morning.     SUMAtriptan (IMITREX) 100 MG tablet Take 100 mg by mouth See admin instructions. Take one tablet (100 mg) by mouth at onset of migraine headache, may repeat in 2 hours if headache persists or recurs.     tamoxifen (NOLVADEX) 20 MG tablet TAKE 1 TABLET(20 MG) BY MOUTH DAILY (Patient taking  differently: Take 20 mg by mouth every morning.) 30 tablet 3   triamcinolone (KENALOG) 0.1 % Apply 1 application topically 2 (two) times daily as needed (psoriasis). Mixed with Cetaphil 1:1     venlafaxine XR (EFFEXOR-XR) 37.5 MG 24 hr capsule Take 37.5 mg by mouth at bedtime. Take 1 capsule (75 mg) + 1 capsule (37.5 mg) to equal 112.5 mg at bedtime     venlafaxine XR (EFFEXOR-XR) 75 MG 24 hr capsule Take 75 mg by mouth at bedtime. Take 1 capsule (75 mg) + 1 capsule (37.5 mg) to equal 112.5 mg  at bedtime     vitamin B-12 (CYANOCOBALAMIN) 1000 MCG tablet Take 1,000 mcg by mouth every morning.     vitamin C (ASCORBIC ACID) 500 MG tablet Take 500 mg by mouth daily.       Physical Exam: Blood pressure (!) 144/74, pulse 79, temperature 98.7 F (37.1 C), temperature source Oral, resp. rate 16, weight 92.3 kg, SpO2 96 %. General: pleasant, WD/WN white female who is laying in bed in NAD HEENT: head is normocephalic, atraumatic.  Sclera are noninjected.  PERRL.  Ears and nose without any masses or lesions.  Mouth is pink and moist. Dentition fair Heart: Irregular rhythm with regular rate.  Normal s1,s2. No obvious murmurs, gallops, or rubs noted.  Palpable radial pulses bilaterally  Chest wall: Chaperone present. There is prominence/firmness just anterior/inferior to the R axillary incision that extends to the right upper lateral quadrant of the breast and resolves prior to areolar tissue.  There is no obvious cellulitis or skin changes on exam. No drainage.  Lungs: CTAB, no wheezes, rhonchi, or rales noted.  Respiratory effort nonlabored Abd:  Soft, NT, ND, +BS, no masses, hernias, or organomegaly MS: no BUE/BLE edema, calves soft and nontender Skin: warm and dry with no masses, lesions, or rashes Psych: A&Ox4 with an appropriate affect Neuro: cranial nerves grossly intact, normal speech, thought process intact, moves all extremities, gait not assessed  Results for orders placed or performed during  the hospital encounter of 10/12/21 (from the past 48 hour(s))  Cytology - Non PAP;     Status: None  Collection Time: 10/14/21 10:35 AM  Result Value Ref Range   CYTOLOGY - NON GYN      CYTOLOGY - NON PAP CASE: [id number] PATIENT: Genessa Everett Non-Gynecological Cytology Report     Clinical History: stage Ia ER/PR positive/HER2 negative right breast cancer Specimen Submitted:  A. BREAST/AXILLA, RIGHT, FINE NEEDLE ASPIRATION:   FINAL MICROSCOPIC DIAGNOSIS: - No malignant cells identified  SPECIMEN ADEQUACY: Satisfactory for evaluation  GROSS: Received: 3cc's of cloudy, red fluid.(ES:es) Smears: 0 Concentration Method (Thin Prep): 1 Cell Block: 1 Conventional Additional Studies: N/A     Final Diagnosis performed by Jaquita Folds, MD.   Electronically signed 10/15/2021 Technical component performed at [employer]. [facility], [place] [address] [place], [place] [address].  Professional component performed at [facility], [place] [address] [place], [place] [address].  Immunohistochemistry Technical component (if applicable) was performed at [facility] [place], [place] [address].   IMMUNOHISTOCHEMISTRY DISCLAIMER (if applicable): Some of these immunohistochemical stains may have been developed and the performance characteristics determine by [facility]. Some may not have been cleared or approved by the U.S. Food and Drug Administration. The FDA has determined that such clearance or approval is not necessary. This test is used for clinical purposes. It should not be regarded as investigational or for research. This laboratory is certified under the [place] (CLIA-88) as qualified to perform high complexity clinical laboratory testing.  The controls stained appropriately.   Aerobic/Anaerobic Culture w Gram Stain (surgical/deep wound)      Status: None (Preliminary result)   Collection Time: 10/14/21 11:57 AM   Specimen: Wound; Serous Fluid  Result Value Ref Range   Specimen Description WOUND RIGHT AXILLA    Special Requests ASPIRATE    Gram Stain      FEW WBC PRESENT, PREDOMINANTLY MONONUCLEAR NO ORGANISMS SEEN    Culture      NO GROWTH < 24 HOURS Performed at [facility] [address], [place], [place] [address]    Report Status PENDING   Glucose, capillary     Status: Abnormal   Collection Time: 10/14/21 11:57 AM  Result Value Ref Range   Glucose-Capillary 147 (H) 70 - 99 mg/dL    Comment: Glucose reference range applies only to samples taken after fasting for at least 8 hours.  Glucose, capillary     Status: Abnormal   Collection Time: 10/14/21  3:52 PM  Result Value Ref Range   Glucose-Capillary 135 (H) 70 - 99 mg/dL    Comment: Glucose reference range applies only to samples taken after fasting for at least 8 hours.  Glucose, capillary     Status: Abnormal   Collection Time: 10/14/21  7:32 PM  Result Value Ref Range   Glucose-Capillary 173 (H) 70 - 99 mg/dL    Comment: Glucose reference range applies only to samples taken after fasting for at least 8 hours.  CBC with Differential/Platelet     Status: Abnormal   Collection Time: 10/15/21  6:01 AM  Result Value Ref Range   WBC 6.2 4.0 - 10.5 K/uL   RBC 3.37 (L) 3.87 - 5.11 MIL/uL   Hemoglobin 9.6 (L) 12.0 - 15.0 g/dL   HCT 30.4 (L) 36.0 - 46.0 %   MCV 90.2 80.0 - 100.0 fL   MCH 28.5 26.0 - 34.0 pg   MCHC 31.6 30.0 - 36.0 g/dL   RDW 17.4 (H) 11.5 - 15.5 %  Platelets 156 150 - 400 K/uL   nRBC 0.0 0.0 - 0.2 %   Neutrophils Relative % 71 %   Neutro Abs 4.4 1.7 - 7.7 K/uL   Lymphocytes Relative 19 %   Lymphs Abs 1.2 0.7 - 4.0 K/uL   Monocytes Relative 9 %   Monocytes Absolute 0.6 0.1 - 1.0 K/uL   Eosinophils Relative 1 %   Eosinophils Absolute 0.1 0.0 - 0.5 K/uL   Basophils Relative 0 %   Basophils Absolute 0.0 0.0 - 0.1 K/uL   Immature  Granulocytes 0 %   Abs Immature Granulocytes 0.02 0.00 - 0.07 K/uL    Comment: Performed at [facility], [name], [place], [place] [address]  Basic metabolic panel     Status: Abnormal   Collection Time: 10/15/21  6:01 AM  Result Value Ref Range   Sodium 137 135 - 145 mmol/L   Potassium 3.8 3.5 - 5.1 mmol/L   Chloride 105 98 - 111 mmol/L   CO2 24 22 - 32 mmol/L   Glucose, Bld 148 (H) 70 - 99 mg/dL    Comment: Glucose reference range applies only to samples taken after fasting for at least 8 hours.   BUN 9 8 - 23 mg/dL   Creatinine, Ser 7.06 0.44 - 1.00 mg/dL   Calcium 8.3 (L) 8.9 - 10.3 mg/dL   GFR, Estimated >57 >75 mL/min    Comment: (NOTE) Calculated using the CKD-EPI Creatinine Equation (2021)    Anion gap 8 5 - 15    Comment: Performed at [facility], [address], [place], [place] [address]  Phosphorus     Status: None   Collection Time: 10/15/21  6:01 AM  Result Value Ref Range   Phosphorus 3.4 2.5 - 4.6 mg/dL    Comment: Performed at [facility], [address], [place], [place] [address]  Glucose, capillary     Status: Abnormal   Collection Time: 10/15/21  7:28 AM  Result Value Ref Range   Glucose-Capillary 129 (H) 70 - 99 mg/dL    Comment: Glucose reference range applies only to samples taken after fasting for at least 8 hours.  Glucose, capillary     Status: Abnormal   Collection Time: 10/15/21 11:15 AM  Result Value Ref Range   Glucose-Capillary 122 (H) 70 - 99 mg/dL    Comment: Glucose reference range applies only to samples taken after fasting for at least 8 hours.  Glucose, capillary     Status: Abnormal   Collection Time: 10/15/21  4:22 PM  Result Value Ref Range   Glucose-Capillary 111 (H) 70 - 99 mg/dL    Comment: Glucose reference range applies only to samples taken after fasting for at least 8 hours.  Glucose, capillary     Status: Abnormal   Collection Time: 10/15/21  8:33 PM  Result Value Ref Range   Glucose-Capillary 122  (H) 70 - 99 mg/dL    Comment: Glucose reference range applies only to samples taken after fasting for at least 8 hours.  CBC with Differential/Platelet     Status: Abnormal   Collection Time: 10/16/21  2:50 AM  Result Value Ref Range   WBC 5.3 4.0 - 10.5 K/uL   RBC 3.28 (L) 3.87 - 5.11 MIL/uL   Hemoglobin 9.5 (L) 12.0 - 15.0 g/dL   HCT 76.6 (L) 22.1 - 55.8 %   MCV 90.9 80.0 - 100.0 fL   MCH 29.0 26.0 - 34.0 pg  MCHC 31.9 30.0 - 36.0 g/dL   RDW 17.2 (H) 11.5 - 15.5 %   Platelets 158 150 - 400 K/uL   nRBC 0.0 0.0 - 0.2 %   Neutrophils Relative % 64 %   Neutro Abs 3.4 1.7 - 7.7 K/uL   Lymphocytes Relative 20 %   Lymphs Abs 1.1 0.7 - 4.0 K/uL   Monocytes Relative 12 %   Monocytes Absolute 0.6 0.1 - 1.0 K/uL   Eosinophils Relative 3 %   Eosinophils Absolute 0.2 0.0 - 0.5 K/uL   Basophils Relative 1 %   Basophils Absolute 0.0 0.0 - 0.1 K/uL   Immature Granulocytes 0 %   Abs Immature Granulocytes 0.01 0.00 - 0.07 K/uL    Comment: Performed at [facility] [address], [place], [place] [address]  Basic metabolic panel     Status: Abnormal   Collection Time: 10/16/21  2:50 AM  Result Value Ref Range   Sodium 136 135 - 145 mmol/L   Potassium 3.6 3.5 - 5.1 mmol/L   Chloride 103 98 - 111 mmol/L   CO2 25 22 - 32 mmol/L   Glucose, Bld 123 (H) 70 - 99 mg/dL    Comment: Glucose reference range applies only to samples taken after fasting for at least 8 hours.   BUN 9 8 - 23 mg/dL   Creatinine, Ser 0.70 0.44 - 1.00 mg/dL   Calcium 8.4 (L) 8.9 - 10.3 mg/dL   GFR, Estimated >60 >60 mL/min    Comment: (NOTE) Calculated using the CKD-EPI Creatinine Equation (2021)    Anion gap 8 5 - 15    Comment: Performed at [facility] [address] [place], [place] [address]  Magnesium     Status: None   Collection Time: 10/16/21  2:50 AM  Result Value Ref Range   Magnesium 1.9 1.7 - 2.4 mg/dL    Comment: Performed at [facility] [address] [place], [place] [address]   Phosphorus     Status: None   Collection Time: 10/16/21  2:50 AM  Result Value Ref Range   Phosphorus 4.4 2.5 - 4.6 mg/dL    Comment: Performed at [facility] [address], [place], [place] [address]  Glucose, capillary     Status: Abnormal   Collection Time: 10/16/21  8:26 AM  Result Value Ref Range   Glucose-Capillary 125 (H) 70 - 99 mg/dL    Comment: Glucose reference range applies only to samples taken after fasting for at least 8 hours.   [place] IMAGE GUIDED DRAINAGE BY PERCUTANEOUS CATHETER  Result Date: 10/14/2021 INDICATION: Infection, unknown etiology. RIGHT breast and axillary collections on CT. EXAM: US-GUIDED DRAINAGE OF RIGHT AXILLARY FLUID COLLECTION COMPARISON:  CT CAP, 10/13/2021. MEDICATIONS: The patient is currently admitted to the hospital and receiving intravenous antibiotics. The antibiotics were administered within an appropriate time frame prior to the initiation of the procedure. ANESTHESIA/SEDATION: Local anesthetic was administered. CONTRAST:  None COMPLICATIONS: None immediate. PROCEDURE: Informed written consent was obtained from the patient after a discussion of the risks, benefits and alternatives to treatment. Preprocedural ultrasound scanning demonstrated well-circumscribed, mildly complex RIGHT axillary fluid collection. A timeout was performed prior to the initiation of the procedure. The RIGHT chest and axilla was prepped and draped in the usual sterile fashion. The overlying soft tissues were anesthetized with 1% lidocaine with epinephrine. Under direct ultrasound guidance, a 18 gauge 10 cm Yueh needle was advanced into the abscess/fluid collection. Multiple  ultrasound images were saved for procedural documentation purposes. Next, approximately 5 mL serous fluid was aspirated from the collection. A representative sample of aspirated fluid was capped and sent to the laboratory for analysis. The needle was removed and superficial hemostasis was achieved with  manual compression. A dressing was placed. The patient tolerated the procedure well without immediate postprocedural complication. IMPRESSION: Successful US-guided aspiration of RIGHT axillary fluid collection. A representative aspirated sample was sent to the laboratory as requested by the ordering clinical team. Michaelle Birks, MD [facility] Radiology Electronically Signed   By: Michaelle Birks M.D.   On: 10/14/2021 15:40    Anti-infectives (From admission, onward)    Start     Dose/Rate Route Frequency Ordered Stop   10/14/21 1800  cefTRIAXone (ROCEPHIN) 2 g in sodium chloride 0.9 % 100 mL IVPB        2 g 200 mL/hr over 30 Minutes Intravenous Every 24 hours 10/14/21 1014     10/14/21 0700  vancomycin (VANCOREADY) IVPB 1000 mg/200 mL        1,000 mg 200 mL/hr over 60 Minutes Intravenous Daily 10/14/21 0612     10/13/21 2300  vancomycin (VANCOREADY) IVPB 1000 mg/200 mL  Status:  Discontinued        1,000 mg 200 mL/hr over 60 Minutes Intravenous Every 24 hours 10/12/21 2025 10/13/21 1022   10/13/21 1800  cefTRIAXone (ROCEPHIN) 1 g in sodium chloride 0.9 % 100 mL IVPB  Status:  Discontinued        1 g 200 mL/hr over 30 Minutes Intravenous Every 24 hours 10/12/21 1947 10/14/21 1014   10/12/21 2015  vancomycin (VANCOREADY) IVPB 1750 mg/350 mL        1,750 mg 175 mL/hr over 120 Minutes Intravenous  Once 10/12/21 2012 10/12/21 2325   10/12/21 1900  cefTRIAXone (ROCEPHIN) 2 g in sodium chloride 0.9 % 100 mL IVPB        2 g 200 mL/hr over 30 Minutes Intravenous  Once 10/12/21 1859 10/12/21 1930   10/12/21 1900  fluconazole (DIFLUCAN) tablet 200 mg        200 mg Oral  Once 10/12/21 1859 10/12/21 1945       Assessment/Plan R axillary and R breast fluid collections  Prior hx of R breast cancer s/p R lumpectomy and sentinel lymph node bx by Dr. Barry Dienes on 02/18/21 (margins negative, cT1cN0, her2 negative) and radiation, now on Tamoxifen   - CT w/ 4.9 x  3.8 cm fluid collection of the right axilla and 2.9 x 2.7 cm fluid collection of the posterior right breast - s/p US guided aspiration of the right axillary fluid collection yielding 31mL serous fluid by IR. Cx's pending from this and currently NGTD.  - On exam patient does have some prominence/firmness just anterior/inferior to the axillary incision that extends to the right upper lateral quadrant of the breast and resolves prior to areolar tissue.  There is no obvious cellulitis or skin changes on exam.   -Will discuss with Dr. Barry Dienes. Further recommendations to follow  FEN - Reg VTE - SCDs, on Eliquis ID - Rocephin/Vanc - Per ID  Fever HA - MRI brain negative  PAF on Eliquis (hx of cardioversion 9/28 and 11/3) DM2 B12/Iron deficiency anemia  Jillyn Ledger, [facility] 10/16/2021, 9:53 AM Please see Amion for pager number during day hours 7:00am-4:30pm

## 2021-10-16 NOTE — Progress Notes (Signed)
PROGRESS NOTE  [name]:[id number] DOB: 13-Oct-1951 DOA: 10/12/2021 PCP: Truitt Merle, MD  HPI/Recap of past 24 hours:  Kayla Bennett is a 70 y.o. female with medical history significant for chronic iron deficiency anemia Baseline hemoglobin 9-11, paroxysmal A. fib on Eliquis, type 2 diabetes, psoriasis stage I right-sided breast cancer diagnosed in January 2022, completed radiation therapy and is on tamoxifen, who was admitted to [facility] on 10/12/2021 with sepsis of unclear source after presenting from home with fevers for 2 days.  Associated with generalized fatigue, weakness, and intermittent headache.  Upon presentation to the ED, she is febrile with T-max 103.1, negative UA and negative chest x-ray.  ID consulted, pan Ct'd.  CT chest showed right axillary fluid collection.  MRI brain with and without contrast was negative for any acute intracranial findings.  Seen by IR for aspiration of right axillary fluid collection, completed on 10/14/2021.     Subjective  Reports headache significantly improved, she denies any fever or chills.,  Reports she is feeling better.  Assessment/Plan: Principal Problem:   Sepsis ([place]) Active Problems:   Malignant neoplasm of upper-outer quadrant of right breast in female, estrogen receptor positive (HCC)   Persistent atrial fibrillation (HCC)   Iron deficiency anemia   Fever   Nausea   Headache   Generalized weakness   Diabetes ([place])   Hypertension   GERD (gastroesophageal reflux disease)  Sepsis of unknown source Presented with 2 days of fever generalized fatigue and weakness T-max 103.1 in the ED. Respiration rate 30. UA and chest x-ray negative CT chest/abdomen pelvis with contrast revealed fluid collection in the right axilla and posterior right breast question postoperative such as seroma, hematoma or lymphocele, infection not excluded by CT. Currently on Rocephin and IV vancomycin empirically Continue to follow  blood cultures and body fluid culture. Appreciate infectious disease assistance. Monitor fever curve and WBC Right axillary and right breast fluid collections, on imaging, have consulted surgery regarding further recommendation, status post IR aspiration of breast surgical fluid collection, follow on Gram stain and cultures.  Stage I right breast cancer, follows with medical oncology Dr. Burr Medico Post radiation, currently on tamoxifen Medical oncology Dr. Burr Medico, following, appreciate assistance.  Intermittent headache Follow MRI brain with and without contrast negative for any acute intracranial findings.    improved today  Paroxysmal A. fib on Eliquis Rate controlled on Toprol-XL. Continue Eliquis for CVA prevention Continue to monitor on telemetry.  Chronic anxiety/depression Continue home Effexor  Type 2 diabetes with diabetic polyneuropathy Last hemoglobin A1c 6.7 on 02/13/2021. Continue insulin sliding scale Continue gabapentin Continue to avoid hypoglycemia  GERD Continue home PPI twice daily.  Iron deficiency anemia/anemia of chronic disease IV Feraheme infusion completed on 10/14/2021.   Resolved Hypophosphatemia Repleted orally Serum phosphorus 3.4 from 2.1.    Code Status: Full code  Family Communication: None at bedside  Disposition Plan: Likely will discharge to home once infectious disease signs off.   Consultants: Infectious disease Medical oncology General surgery  Procedures: None  Antimicrobials: Rocephin IV vancomycin DC'd on 10/13/2021  DVT prophylaxis: Eliquis  Status is: Inpatient  Inpatient status.  Patient requires at least 2 midnights for further evaluation and treatment of present condition.        Objective: Vitals:   10/16/21 0305 10/16/21 0820 10/16/21 1026 10/16/21 1220  BP:  (!) 144/74    Pulse:  79  79  Resp:  16  16  Temp:  98.7 F (37.1 C)  97.7 F (36.5 C)  TempSrc:  Oral  Oral  SpO2:  96%  97%  Weight:  92.3 kg     Height:   5\' 2"  (1.575 m)     Intake/Output Summary (Last 24 hours) at 10/16/2021 1402 Last data filed at 10/16/2021 0900 Gross per 24 hour  Intake 817.1 ml  Output --  Net 817.1 ml   Filed Weights   10/14/21 0413 10/15/21 0334 10/16/21 0305  Weight: 93.1 kg 93 kg 92.3 kg    Exam:   Awake Alert, Oriented X 3, No new F.N deficits, Normal affect Symmetrical Chest wall movement, Good air movement bilaterally, CTAB RRR,No Gallops,Rubs or new Murmurs, No Parasternal Heave +ve B.Sounds, Abd Soft, No tenderness, No rebound - guarding or rigidity. No Cyanosis, Clubbing or edema, No new Rash or bruise      Data Reviewed: CBC: Recent Labs  Lab 10/12/21 1639 10/13/21 0548 10/14/21 0219 10/15/21 0601 10/16/21 0250  WBC 11.7* 11.6* 7.8 6.2 5.3  NEUTROABS 8.5* 8.5* 5.4 4.4 3.4  HGB 11.3* 10.7* 10.4* 9.6* 9.5*  HCT 36.1 34.4* 33.3* 30.4* 29.8*  MCV 89.6 90.5 91.2 90.2 90.9  PLT 173 152 154 156 160   Basic Metabolic Panel: Recent Labs  Lab 10/12/21 1413 10/13/21 0548 10/14/21 0219 10/15/21 0601 10/16/21 0250  NA 135 135 138 137 136  K 3.9 3.1* 3.7 3.8 3.6  CL 101 103 105 105 103  CO2 24 22 26 24 25   GLUCOSE 153* 174* 137* 148* 123*  BUN 12 11 8 9 9   CREATININE 0.82 0.82 0.71 0.73 0.70  CALCIUM 8.8* 7.8* 8.0* 8.3* 8.4*  MG  --  1.5*  1.5* 2.1  --  1.9  PHOS  --   --  2.1* 3.4 4.4   GFR: Estimated Creatinine Clearance: 69.2 mL/min (by C-G formula based on SCr of 0.7 mg/dL). Liver Function Tests: Recent Labs  Lab 10/13/21 0548 10/14/21 0219  AST 26  26 23   ALT 19  19 24   ALKPHOS 49  50 47  BILITOT 0.8  0.9 0.4  PROT 6.1*  6.0* 6.1*  ALBUMIN 2.8*  2.8* 2.7*   No results for input(s): LIPASE, AMYLASE in the last 168 hours. No results for input(s): AMMONIA in the last 168 hours. Coagulation Profile: No results for input(s): INR, PROTIME in the last 168 hours. Cardiac Enzymes: No results for input(s): CKTOTAL, CKMB, CKMBINDEX, TROPONINI in  the last 168 hours. BNP (last 3 results) No results for input(s): PROBNP in the last 8760 hours. HbA1C: No results for input(s): HGBA1C in the last 72 hours. CBG: Recent Labs  Lab 10/15/21 1115 10/15/21 1622 10/15/21 2033 10/16/21 0826 10/16/21 1154  GLUCAP 122* 111* 122* 125* 131*   Lipid Profile: No results for input(s): CHOL, HDL, LDLCALC, TRIG, CHOLHDL, LDLDIRECT in the last 72 hours. Thyroid Function Tests: No results for input(s): TSH, T4TOTAL, FREET4, T3FREE, THYROIDAB in the last 72 hours.  Anemia Panel: No results for input(s): VITAMINB12, FOLATE, FERRITIN, TIBC, IRON, RETICCTPCT in the last 72 hours. Urine analysis:    Component Value Date/Time   COLORURINE YELLOW 10/12/2021 1946   APPEARANCEUR CLEAR 10/12/2021 1946   LABSPEC 1.008 10/12/2021 1946   PHURINE 6.0 10/12/2021 1946   GLUCOSEU NEGATIVE 10/12/2021 1946   HGBUR NEGATIVE 10/12/2021 1946   BILIRUBINUR NEGATIVE 10/12/2021 1946   KETONESUR NEGATIVE 10/12/2021 1946   PROTEINUR NEGATIVE 10/12/2021 1946   NITRITE NEGATIVE 10/12/2021 1946   LEUKOCYTESUR NEGATIVE 10/12/2021 1946   Sepsis Labs: @LABRCNTIP (procalcitonin:4,lacticidven:4)  )  Recent Results (from the past 240 hour(s))  Resp Panel by RT-PCR (Flu A&B, Covid) Nasopharyngeal Swab     Status: None   Collection Time: 10/12/21  2:13 PM   Specimen: Nasopharyngeal Swab; Nasopharyngeal(NP) swabs in vial transport medium  Result Value Ref Range Status   SARS Coronavirus 2 by RT PCR NEGATIVE NEGATIVE Final    Comment: (NOTE) SARS-CoV-2 target nucleic acids are NOT DETECTED.  The SARS-CoV-2 RNA is generally detectable in upper respiratory specimens during the acute phase of infection. The lowest concentration of SARS-CoV-2 viral copies this assay can detect is 138 copies/mL. A negative result does not preclude SARS-Cov-2 infection and should not be used as the sole basis for treatment or other patient management decisions. A negative result may occur  with  improper specimen collection/handling, submission of specimen other than nasopharyngeal swab, presence of viral mutation(s) within the areas targeted by this assay, and inadequate number of viral copies(<138 copies/mL). A negative result must be combined with clinical observations, patient history, and epidemiological information. The expected result is Negative.  Fact Sheet for Patients:  EntrepreneurPulse.com.au  Fact Sheet for Healthcare Providers:  IncredibleEmployment.be  This test is no t yet approved or cleared by the [place] FDA and  has been authorized for detection and/or diagnosis of SARS-CoV-2 by FDA under an Emergency Use Authorization (EUA). This EUA will remain  in effect (meaning this test can be used) for the duration of the COVID-19 declaration under Section 564(b)(1) of the Act, 21 U.S.C.section 360bbb-3(b)(1), unless the authorization is terminated  or revoked sooner.       Influenza A by PCR NEGATIVE NEGATIVE Final   Influenza B by PCR NEGATIVE NEGATIVE Final    Comment: (NOTE) The Xpert Xpress SARS-CoV-2/FLU/RSV plus assay is intended as an aid in the diagnosis of influenza from Nasopharyngeal swab specimens and should not be used as a sole basis for treatment. Nasal washings and aspirates are unacceptable for Xpert Xpress SARS-CoV-2/FLU/RSV testing.  Fact Sheet for Patients: EntrepreneurPulse.com.au  Fact Sheet for Healthcare Providers: IncredibleEmployment.be  This test is not yet approved or cleared by the [place] FDA and has been authorized for detection and/or diagnosis of SARS-CoV-2 by FDA under an Emergency Use Authorization (EUA). This EUA will remain in effect (meaning this test can be used) for the duration of the COVID-19 declaration under Section 564(b)(1) of the Act, 21 U.S.C. section 360bbb-3(b)(1), unless the authorization is terminated  or revoked.  Performed at [facility], [place] [address] [place], [place] [address]   Blood culture (routine x 2)     Status: None (Preliminary result)   Collection Time: 10/12/21  4:39 PM   Specimen: BLOOD LEFT HAND  Result Value Ref Range Status   Specimen Description BLOOD LEFT HAND  Final   Special Requests   Final    BOTTLES DRAWN AEROBIC AND ANAEROBIC Blood Culture results may not be optimal due to an inadequate volume of blood received in culture bottles   Culture   Final    NO GROWTH 4 DAYS Performed at [facility], [place] [address] [place], [place] [address]    Report Status PENDING  Incomplete  Blood culture (routine x 2)     Status: None (Preliminary result)   Collection Time: 10/13/21  5:48 AM   Specimen: BLOOD LEFT HAND  Result Value Ref Range Status   Specimen Description BLOOD LEFT HAND  Final   Special Requests AEROBIC BOTTLE ONLY Blood Culture adequate volume  Final  Culture   Final    NO GROWTH 3 DAYS Performed at [facility], [place] [address], [place], [place] [address]    Report Status PENDING  Incomplete  MRSA Next Gen by PCR, Nasal     Status: None   Collection Time: 10/14/21  9:24 AM   Specimen: Nasal Mucosa; Nasal Swab  Result Value Ref Range Status   MRSA by PCR Next Gen NOT DETECTED NOT DETECTED Final    Comment: (NOTE) The GeneXpert MRSA Assay (FDA approved for NASAL specimens only), is one component of a comprehensive MRSA colonization surveillance program. It is not intended to diagnose MRSA infection nor to guide or monitor treatment for MRSA infections. Test performance is not FDA approved in patients less than 77 years old. Performed at [facility], [place] [address] [place], [place] [address]   Aerobic/Anaerobic Culture w Gram Stain (surgical/deep wound)     Status: None (Preliminary result)   Collection Time: 10/14/21 11:57 AM   Specimen: Wound; Serous Fluid  Result Value Ref Range Status   Specimen Description  WOUND RIGHT AXILLA  Final   Special Requests ASPIRATE  Final   Gram Stain   Final    FEW WBC PRESENT, PREDOMINANTLY MONONUCLEAR NO ORGANISMS SEEN    Culture   Final    NO GROWTH 2 DAYS NO ANAEROBES ISOLATED; CULTURE IN PROGRESS FOR 5 DAYS Performed at [facility], [address], [place], [place] [address]    Report Status PENDING  Incomplete      Studies: No results found.  Scheduled Meds:  apixaban  5 mg Oral BID   gabapentin  200 mg Oral QHS   losartan  100 mg Oral Daily   And   hydrochlorothiazide  25 mg Oral Daily   insulin aspart  0-6 Units Subcutaneous TID WC   loratadine  10 mg Oral Daily   metoprolol succinate  25 mg Oral Daily   pantoprazole  40 mg Oral BID AC   potassium & sodium phosphates  1 packet Oral TID WC & HS   senna-docusate  2 tablet Oral Q0600   tamoxifen  20 mg Oral Daily   venlafaxine XR  75 mg Oral QHS   And   venlafaxine XR  37.5 mg Oral QHS    Continuous Infusions:  cefTRIAXone (ROCEPHIN)  IV 2 g (10/15/21 1655)   vancomycin 1,000 mg (10/16/21 0930)     LOS: 4 days     Phillips Climes, MD [facility] Pager [phone]  If 7PM-7AM, please contact night-coverage [email or web address] Password [email or web address] 10/16/2021, 2:02 PM

## 2021-10-16 NOTE — Telephone Encounter (Signed)
Left message for the pt to please call the office so that we may set another time for her to come by and pick up a new [place] study and return the current study which did not work. I did leave on the message that there is a time limit that we like to have the study done by. Our office has been trying to set up new study for the pt since 09/05/21, see previous notes. Left message for the pt to call back and ask to s/w Arbie Cookey who does the Home Sleep Studies.

## 2021-10-16 NOTE — Progress Notes (Signed)
Subjective:  Patient is feeling better today   Antibiotics:  Anti-infectives (From admission, onward)    Start     Dose/Rate Route Frequency Ordered Stop   10/14/21 1800  cefTRIAXone (ROCEPHIN) 2 g in sodium chloride 0.9 % 100 mL IVPB        2 g 200 mL/hr over 30 Minutes Intravenous Every 24 hours 10/14/21 1014     10/14/21 0700  vancomycin (VANCOREADY) IVPB 1000 mg/200 mL        1,000 mg 200 mL/hr over 60 Minutes Intravenous Daily 10/14/21 0612     10/13/21 2300  vancomycin (VANCOREADY) IVPB 1000 mg/200 mL  Status:  Discontinued        1,000 mg 200 mL/hr over 60 Minutes Intravenous Every 24 hours 10/12/21 2025 10/13/21 1022   10/13/21 1800  cefTRIAXone (ROCEPHIN) 1 g in sodium chloride 0.9 % 100 mL IVPB  Status:  Discontinued        1 g 200 mL/hr over 30 Minutes Intravenous Every 24 hours 10/12/21 1947 10/14/21 1014   10/12/21 2015  vancomycin (VANCOREADY) IVPB 1750 mg/350 mL        1,750 mg 175 mL/hr over 120 Minutes Intravenous  Once 10/12/21 2012 10/12/21 2325   10/12/21 1900  cefTRIAXone (ROCEPHIN) 2 g in sodium chloride 0.9 % 100 mL IVPB        2 g 200 mL/hr over 30 Minutes Intravenous  Once 10/12/21 1859 10/12/21 1930   10/12/21 1900  fluconazole (DIFLUCAN) tablet 200 mg        200 mg Oral  Once 10/12/21 1859 10/12/21 1945       Medications: Scheduled Meds:  apixaban  5 mg Oral BID   gabapentin  200 mg Oral QHS   losartan  100 mg Oral Daily   And   hydrochlorothiazide  25 mg Oral Daily   insulin aspart  0-6 Units Subcutaneous TID WC   loratadine  10 mg Oral Daily   metoprolol succinate  25 mg Oral Daily   pantoprazole  40 mg Oral BID AC   potassium & sodium phosphates  1 packet Oral TID WC & HS   senna-docusate  2 tablet Oral Q0600   tamoxifen  20 mg Oral Daily   venlafaxine XR  75 mg Oral QHS   And   venlafaxine XR  37.5 mg Oral QHS   Continuous Infusions:  cefTRIAXone (ROCEPHIN)  IV 2 g (10/15/21 1655)   vancomycin 1,000 mg (10/16/21 0930)    PRN Meds:.acetaminophen **OR** acetaminophen, albuterol, HYDROcodone-acetaminophen, ibuprofen, ondansetron (ZOFRAN) IV    Objective: Weight change: -0.7 kg  Intake/Output Summary (Last 24 hours) at 10/16/2021 1423 Last data filed at 10/16/2021 0900 Gross per 24 hour  Intake 817.1 ml  Output --  Net 817.1 ml    Blood pressure (!) 144/74, pulse 79, temperature 97.7 F (36.5 C), temperature source Oral, resp. rate 16, height 5\' 2"  (1.575 m), weight 92.3 kg, SpO2 97 %. Temp:  [97.7 F (36.5 C)-98.9 F (37.2 C)] 97.7 F (36.5 C) (11/30 1220) Pulse Rate:  [71-82] 79 (11/30 1220) Resp:  [14-17] 16 (11/30 1220) BP: (119-145)/(59-84) 144/74 (11/30 0820) SpO2:  [94 %-99 %] 97 % (11/30 1220) Weight:  [92.3 kg] 92.3 kg (11/30 0305)  Physical Exam: Physical Exam Constitutional:      General: She is not in acute distress.    Appearance: She is well-developed. She is not diaphoretic.  HENT:     Head: Normocephalic and  atraumatic.     Right Ear: External ear normal.     Left Ear: External ear normal.     Mouth/Throat:     Pharynx: No oropharyngeal exudate.  Eyes:     General: No scleral icterus.    Conjunctiva/sclera: Conjunctivae normal.     Pupils: Pupils are equal, round, and reactive to light.  Cardiovascular:     Rate and Rhythm: Normal rate and regular rhythm.  Pulmonary:     Effort: Pulmonary effort is normal. No respiratory distress.     Breath sounds: No wheezing or rales.  Abdominal:     General: There is no distension.     Palpations: Abdomen is soft.     Tenderness: There is no rebound.  Musculoskeletal:        General: No tenderness.  Lymphadenopathy:     Cervical: No cervical adenopathy.  Skin:    General: Skin is warm and dry.     Coloration: Skin is not pale.     Findings: Erythema present. No rash.  Neurological:     General: No focal deficit present.     Mental Status: She is alert and oriented to person, place, and time.     Motor: No abnormal  muscle tone.     Coordination: Coordination normal.  Psychiatric:        Mood and Affect: Mood normal. Mood is not anxious or depressed.        Speech: Speech normal.        Behavior: Behavior normal. Behavior is not agitated.        Thought Content: Thought content normal.        Cognition and Memory: Cognition is not impaired. Memory is not impaired.        Judgment: Judgment normal.    Right axillary cystic area not as tender as when I examined her yesterday breast cyst also not as prominent  CBC:    BMET Recent Labs    10/15/21 0601 10/16/21 0250  NA 137 136  K 3.8 3.6  CL 105 103  CO2 24 25  GLUCOSE 148* 123*  BUN 9 9  CREATININE 0.73 0.70  CALCIUM 8.3* 8.4*      Liver Panel  Recent Labs    10/14/21 0219  PROT 6.1*  ALBUMIN 2.7*  AST 23  ALT 24  ALKPHOS 47  BILITOT 0.4        Sedimentation Rate No results for input(s): ESRSEDRATE in the last 72 hours.  C-Reactive Protein No results for input(s): CRP in the last 72 hours.   Micro Results: Recent Results (from the past 720 hour(s))  Resp Panel by RT-PCR (Flu A&B, Covid) Nasopharyngeal Swab     Status: None   Collection Time: 10/12/21  2:13 PM   Specimen: Nasopharyngeal Swab; Nasopharyngeal(NP) swabs in vial transport medium  Result Value Ref Range Status   SARS Coronavirus 2 by RT PCR NEGATIVE NEGATIVE Final    Comment: (NOTE) SARS-CoV-2 target nucleic acids are NOT DETECTED.  The SARS-CoV-2 RNA is generally detectable in upper respiratory specimens during the acute phase of infection. The lowest concentration of SARS-CoV-2 viral copies this assay can detect is 138 copies/mL. A negative result does not preclude SARS-Cov-2 infection and should not be used as the sole basis for treatment or other patient management decisions. A negative result may occur with  improper specimen collection/handling, submission of specimen other than nasopharyngeal swab, presence of viral mutation(s) within  the areas targeted by this assay, and  inadequate number of viral copies(<138 copies/mL). A negative result must be combined with clinical observations, patient history, and epidemiological information. The expected result is Negative.  Fact Sheet for Patients:  EntrepreneurPulse.com.au  Fact Sheet for Healthcare Providers:  IncredibleEmployment.be  This test is no t yet approved or cleared by the [place] FDA and  has been authorized for detection and/or diagnosis of SARS-CoV-2 by FDA under an Emergency Use Authorization (EUA). This EUA will remain  in effect (meaning this test can be used) for the duration of the COVID-19 declaration under Section 564(b)(1) of the Act, 21 U.S.C.section 360bbb-3(b)(1), unless the authorization is terminated  or revoked sooner.       Influenza A by PCR NEGATIVE NEGATIVE Final   Influenza B by PCR NEGATIVE NEGATIVE Final    Comment: (NOTE) The Xpert Xpress SARS-CoV-2/FLU/RSV plus assay is intended as an aid in the diagnosis of influenza from Nasopharyngeal swab specimens and should not be used as a sole basis for treatment. Nasal washings and aspirates are unacceptable for Xpert Xpress SARS-CoV-2/FLU/RSV testing.  Fact Sheet for Patients: EntrepreneurPulse.com.au  Fact Sheet for Healthcare Providers: IncredibleEmployment.be  This test is not yet approved or cleared by the [place] FDA and has been authorized for detection and/or diagnosis of SARS-CoV-2 by FDA under an Emergency Use Authorization (EUA). This EUA will remain in effect (meaning this test can be used) for the duration of the COVID-19 declaration under Section 564(b)(1) of the Act, 21 U.S.C. section 360bbb-3(b)(1), unless the authorization is terminated or revoked.  Performed at [facility], [place] [address] [place], [place] [address]   Blood culture (routine x 2)     Status: None  (Preliminary result)   Collection Time: 10/12/21  4:39 PM   Specimen: BLOOD LEFT HAND  Result Value Ref Range Status   Specimen Description BLOOD LEFT HAND  Final   Special Requests   Final    BOTTLES DRAWN AEROBIC AND ANAEROBIC Blood Culture results may not be optimal due to an inadequate volume of blood received in culture bottles   Culture   Final    NO GROWTH 4 DAYS Performed at [facility], [place] [address], [place], [place] [address]    Report Status PENDING  Incomplete  Blood culture (routine x 2)     Status: None (Preliminary result)   Collection Time: 10/13/21  5:48 AM   Specimen: BLOOD LEFT HAND  Result Value Ref Range Status   Specimen Description BLOOD LEFT HAND  Final   Special Requests AEROBIC BOTTLE ONLY Blood Culture adequate volume  Final   Culture   Final    NO GROWTH 3 DAYS Performed at [facility], [place] [address], [place], [place] [address]    Report Status PENDING  Incomplete  MRSA Next Gen by PCR, Nasal     Status: None   Collection Time: 10/14/21  9:24 AM   Specimen: Nasal Mucosa; Nasal Swab  Result Value Ref Range Status   MRSA by PCR Next Gen NOT DETECTED NOT DETECTED Final    Comment: (NOTE) The GeneXpert MRSA Assay (FDA approved for NASAL specimens only), is one component of a comprehensive MRSA colonization surveillance program. It is not intended to diagnose MRSA infection nor to guide or monitor treatment for MRSA infections. Test performance is not FDA approved in patients less than 79 years old. Performed at [facility], [place] [address], [place],  [address]   Aerobic/Anaerobic Culture w Gram Stain (surgical/deep  wound)     Status: None (Preliminary result)   Collection Time: 10/14/21 11:57 AM   Specimen: Wound; Serous Fluid  Result Value Ref Range Status   Specimen Description WOUND RIGHT AXILLA  Final   Special Requests ASPIRATE  Final   Gram Stain   Final    FEW WBC PRESENT, PREDOMINANTLY MONONUCLEAR NO ORGANISMS  SEEN    Culture   Final    NO GROWTH 2 DAYS NO ANAEROBES ISOLATED; CULTURE IN PROGRESS FOR 5 DAYS Performed at [facility], [address], [place], [place] [address]    Report Status PENDING  Incomplete    Studies/Results: No results found.    Assessment/Plan:  INTERVAL HISTORY: Patient has been seen by general surgery who did not want to drain fl cysts at this time Principal Problem:   Sepsis ([place]) Active Problems:   Malignant neoplasm of upper-outer quadrant of right breast in female, estrogen receptor positive ([place])   Persistent atrial fibrillation ([facility])   Iron deficiency anemia   Fever   Nausea   Headache   Generalized weakness   Diabetes ([place])   Hypertension   GERD (gastroesophageal reflux disease)    Kayla Bennett is a 70 y.o. female with history of breast cancer status postlumpectomy and axillary lymph node dissection radiation who had recent cardioversion for very difficult to treat atrial fibrillation.  She then developed fevers with a severe headache chills numbness in the tips of the fingers myalgias and was admitted to the hospital with apparent sepsis and encephalopathy.  She had had some neck stiffness as well.  When I talked to her and examined her initially she had exquisite pain and tenderness at the 2 cystic sites from where she had had surgery 1 in her axilla and 1 involving her breast tissue.  I have suspected that her source of infection lies in these cysts that have become acutely inflamed and tender.  We obtained an MRI of the brain to ensure there was no evidence of meningitis or encephalitis and there is none.  She continues to improve.  Cultures from her aspirate are unrevealing  I am going to change her over to doxycycline and cefdinir.  If she is stable on this regimen we can potentially send her out on this for at least 2 weeks total therapy post aspiration with followup with [facility], and I can schedule her with me as  well.      LOS: 4 days   Alcide Evener 10/16/2021, 2:23 PM

## 2021-10-16 NOTE — Telephone Encounter (Signed)
I returned a call to the pt who tells me she is currently admitted to the hospital. I apologized that I did not see that.Pt has been admitted since 10/12/21. After speaking with the pt she asked if ok if her husband comes by the office tomorrow to pick up the new study as she may be discharged tomorrow and will then have the study at home with her to do. I assured the pt that is fine that her husband swing by the office. Instructed for her to have her husband stop at the check in desk and let the front desk staff know he is here to see Kayla Bennett about the Home Sleep Studies. I stated to the pt that she just needs to focus on getting better and going home. Pt thanked me for the call back and the help.

## 2021-10-16 NOTE — Progress Notes (Signed)
Physical Therapy Treatment Patient Details Name: TIMI REESER MRN: [id number] DOB: 09/06/1951 Today's Date: 10/16/2021   History of Present Illness 70 y.o. female who is admitted to [facility] on 10/12/2021 with sepsis of unclear source. PMH significant for chronic iron deficiency anemia, paroxysmal atrial fibrillation chronically anticoagulated on Eliquis, type 2 diabetes mellitus, Rt breast Cancer 11/2020    PT Comments    Pt tolerates treatment well, continuing to demonstrate continued lateral instability when ambulating without UE support. Pt also reports continued fatigue in BLE, as well as mild dyspnea with mobility. PT encourages frequent mobilization during admission, as well as use of a cane upon return home in an effort to improve stability.   Recommendations for follow up therapy are one component of a multi-disciplinary discharge planning process, led by the attending physician.  Recommendations may be updated based on patient status, additional functional criteria and insurance authorization.  Follow Up Recommendations  Outpatient PT     Assistance Recommended at Discharge None  Equipment Recommendations  None recommended by PT    Recommendations for Other Services       Precautions / Restrictions Precautions Precautions: Fall Precaution Comments: reports recent decr balance with near fall x 1 Restrictions Weight Bearing Restrictions: No     Mobility  Bed Mobility Overal bed mobility: Modified Independent             General bed mobility comments: HOB elevated    Transfers Overall transfer level: Independent Equipment used: None                    Ambulation/Gait Ambulation/Gait assistance: Supervision Gait Distance (Feet): 250 Feet Assistive device:  (railing when available) Gait Pattern/deviations: Step-through pattern;Drifts right/left Gait velocity: reduced Gait velocity interpretation: 1.31 - 2.62 ft/sec, indicative of  limited community ambulator   General Gait Details: pt with slowed step-through gait, prefers to utilize UE support of railing, PT notes increased lateral drift and away when without UE support   Stairs             Wheelchair Mobility    Modified Rankin (Stroke Patients Only)       Balance Overall balance assessment: Needs assistance Sitting-balance support: No upper extremity supported;Feet supported Sitting balance-Leahy Scale: Good     Standing balance support: No upper extremity supported Standing balance-Leahy Scale: Fair                              Cognition Arousal/Alertness: Awake/alert Behavior During Therapy: WFL for tasks assessed/performed Overall Cognitive Status: Within Functional Limits for tasks assessed                                          Exercises      General Comments General comments (skin integrity, edema, etc.): VSS on RA      Pertinent Vitals/Pain Pain Assessment: Faces Faces Pain Scale: Hurts little more Pain Location: R axilla Pain Descriptors / Indicators: Grimacing Pain Intervention(s): Monitored during session    Home Living                          Prior Function            PT Goals (current goals can now be found in the care plan section) Acute Rehab PT Goals Patient  Stated Goal: regain her strength and balance; not need an assistive device Progress towards PT goals: Progressing toward goals    Frequency    Min 3X/week      PT Plan Current plan remains appropriate    Co-evaluation              AM-PAC PT "6 Clicks" Mobility   Outcome Measure  Help needed turning from your back to your side while in a flat bed without using bedrails?: None Help needed moving from lying on your back to sitting on the side of a flat bed without using bedrails?: None Help needed moving to and from a bed to a chair (including a wheelchair)?: None Help needed standing up from a  chair using your arms (e.g., wheelchair or bedside chair)?: None Help needed to walk in hospital room?: A Little Help needed climbing 3-5 steps with a railing? : A Little 6 Click Score: 22    End of Session   Activity Tolerance: Patient tolerated treatment well Patient left: in chair;with call bell/phone within reach Nurse Communication: Mobility status PT Visit Diagnosis: Unsteadiness on feet (R26.81);Muscle weakness (generalized) (M62.81)     Time: 0350-0938 PT Time Calculation (min) (ACUTE ONLY): 14 min  Charges:  $Gait Training: 8-22 mins                     Zenaida Niece, PT, DPT Acute Rehabilitation Pager: [phone] Office [place] 10/16/2021, 9:24 AM

## 2021-10-16 NOTE — Telephone Encounter (Signed)
Patient returning call.

## 2021-10-17 ENCOUNTER — Other Ambulatory Visit (HOSPITAL_COMMUNITY): Payer: Self-pay

## 2021-10-17 ENCOUNTER — Encounter: Payer: Self-pay | Admitting: Physical Therapy

## 2021-10-17 ENCOUNTER — Ambulatory Visit: Payer: Medicare Other | Attending: General Surgery | Admitting: Physical Therapy

## 2021-10-17 ENCOUNTER — Encounter: Payer: Self-pay | Admitting: Nurse Practitioner

## 2021-10-17 ENCOUNTER — Other Ambulatory Visit: Payer: Self-pay

## 2021-10-17 DIAGNOSIS — N611 Abscess of the breast and nipple: Secondary | ICD-10-CM | POA: Diagnosis not present

## 2021-10-17 DIAGNOSIS — R600 Localized edema: Secondary | ICD-10-CM | POA: Insufficient documentation

## 2021-10-17 DIAGNOSIS — Z483 Aftercare following surgery for neoplasm: Secondary | ICD-10-CM | POA: Diagnosis not present

## 2021-10-17 DIAGNOSIS — Z170 Estrogen receptor positive status [ER+]: Secondary | ICD-10-CM | POA: Diagnosis not present

## 2021-10-17 DIAGNOSIS — R293 Abnormal posture: Secondary | ICD-10-CM | POA: Insufficient documentation

## 2021-10-17 DIAGNOSIS — E119 Type 2 diabetes mellitus without complications: Secondary | ICD-10-CM | POA: Diagnosis not present

## 2021-10-17 DIAGNOSIS — C50411 Malignant neoplasm of upper-outer quadrant of right female breast: Secondary | ICD-10-CM | POA: Diagnosis not present

## 2021-10-17 DIAGNOSIS — A419 Sepsis, unspecified organism: Secondary | ICD-10-CM | POA: Diagnosis not present

## 2021-10-17 DIAGNOSIS — M25611 Stiffness of right shoulder, not elsewhere classified: Secondary | ICD-10-CM | POA: Insufficient documentation

## 2021-10-17 DIAGNOSIS — M6281 Muscle weakness (generalized): Secondary | ICD-10-CM | POA: Insufficient documentation

## 2021-10-17 DIAGNOSIS — R262 Difficulty in walking, not elsewhere classified: Secondary | ICD-10-CM | POA: Diagnosis not present

## 2021-10-17 DIAGNOSIS — R6520 Severe sepsis without septic shock: Secondary | ICD-10-CM | POA: Diagnosis not present

## 2021-10-17 DIAGNOSIS — R509 Fever, unspecified: Secondary | ICD-10-CM | POA: Diagnosis not present

## 2021-10-17 LAB — CBC WITH DIFFERENTIAL/PLATELET
Abs Immature Granulocytes: 0.02 10*3/uL (ref 0.00–0.07)
Basophils Absolute: 0 10*3/uL (ref 0.0–0.1)
Basophils Relative: 1 %
Eosinophils Absolute: 0.2 10*3/uL (ref 0.0–0.5)
Eosinophils Relative: 3 %
HCT: 31.1 % — ABNORMAL LOW (ref 36.0–46.0)
Hemoglobin: 9.8 g/dL — ABNORMAL LOW (ref 12.0–15.0)
Immature Granulocytes: 0 %
Lymphocytes Relative: 24 %
Lymphs Abs: 1.3 10*3/uL (ref 0.7–4.0)
MCH: 28.6 pg (ref 26.0–34.0)
MCHC: 31.5 g/dL (ref 30.0–36.0)
MCV: 90.7 fL (ref 80.0–100.0)
Monocytes Absolute: 0.7 10*3/uL (ref 0.1–1.0)
Monocytes Relative: 12 %
Neutro Abs: 3.2 10*3/uL (ref 1.7–7.7)
Neutrophils Relative %: 60 %
Platelets: 184 10*3/uL (ref 150–400)
RBC: 3.43 MIL/uL — ABNORMAL LOW (ref 3.87–5.11)
RDW: 17.2 % — ABNORMAL HIGH (ref 11.5–15.5)
WBC: 5.4 10*3/uL (ref 4.0–10.5)
nRBC: 0 % (ref 0.0–0.2)

## 2021-10-17 LAB — BASIC METABOLIC PANEL
Anion gap: 13 (ref 5–15)
BUN: 10 mg/dL (ref 8–23)
CO2: 23 mmol/L (ref 22–32)
Calcium: 8.7 mg/dL — ABNORMAL LOW (ref 8.9–10.3)
Chloride: 102 mmol/L (ref 98–111)
Creatinine, Ser: 0.88 mg/dL (ref 0.44–1.00)
GFR, Estimated: >60 mL/min (ref >60)
Glucose, Bld: 120 mg/dL — ABNORMAL HIGH (ref 70–99)
Potassium: 3.9 mmol/L (ref 3.5–5.1)
Sodium: 138 mmol/L (ref 135–145)

## 2021-10-17 LAB — GLUCOSE, CAPILLARY: Glucose-Capillary: 119 mg/dL — ABNORMAL HIGH (ref 70–99)

## 2021-10-17 LAB — CULTURE, BLOOD (ROUTINE X 2): Culture: NO GROWTH

## 2021-10-17 MED ORDER — DOXYCYCLINE HYCLATE 100 MG PO TABS
100.0000 mg | ORAL_TABLET | Freq: Two times a day (BID) | ORAL | 0 refills | Status: DC
  Filled 2021-10-17: qty 20, 10d supply, fill #0

## 2021-10-17 MED ORDER — CEFDINIR 300 MG PO CAPS
300.0000 mg | ORAL_CAPSULE | Freq: Two times a day (BID) | ORAL | 0 refills | Status: DC
  Filled 2021-10-17: qty 21, 11d supply, fill #0

## 2021-10-17 NOTE — Progress Notes (Signed)
Discharge paperwork reviewed with pt. Pt verbalized understanding. Pt's husband will transport pt home. TOC has delivered pt's medications.

## 2021-10-17 NOTE — TOC Transition Note (Addendum)
Transition of Care [facility]) - CM/SW Discharge Note   Patient Details  Name: Kayla Bennett MRN: [id number] Date of Birth: [birth year]  Transition of Care [facility]) CM/SW Contact:  Cyndi Bender, RN Phone Number: 10/17/2021, 11:31 AM   Clinical Narrative:       Final next level of care: Home/Self Care Barriers to Discharge: Barriers Resolved   Patient Goals and CMS Choice Patient states their goals for this hospitalization and ongoing recovery are:: return home      Discharge Placement  Patient stable for discharge. Outpatient PT referral sent to [facility] Outpatient rehab [facility] and Services                 Home with Outpatient PT                    Social Determinants of Health (SDOH) Interventions     Readmission Risk Interventions Readmission Risk Prevention Plan 10/17/2021  Transportation Screening Complete  PCP or Specialist Appt within 5-7 Days Complete  Home Care Screening Complete  Medication Review (RN CM) Complete  Some recent data might be hidden

## 2021-10-17 NOTE — Discharge Summary (Signed)
Physician Discharge Summary  Kayla Bennett LOV:[id number] DOB: [birth year] DOA: 10/12/2021  PCP: Truitt Merle, MD  Admit date: 10/12/2021 Discharge date: 10/17/2021  Admitted From: Home Disposition: Home   Recommendations for Outpatient Follow-up:  Follow up with PCP in 1-2 weeks Please obtain BMP/CBC in one week Please follow-up with general surgery Dr. Barry Dienes on appointment scheduled below  Home Health:NO   Discharge Condition:Stable CODE STATUS: FULL Diet recommendation: Heart Healthy  Brief/Interim Summary: Kayla Bennett is a 70 y.o. female with medical history significant for chronic iron deficiency anemia Baseline hemoglobin 9-11, paroxysmal A. fib on Eliquis, type 2 diabetes, psoriasis stage I right-sided breast cancer diagnosed in January 2022, completed radiation therapy and is on tamoxifen, who was admitted to [facility] on 10/12/2021 with sepsis of unclear source after presenting from home with fevers for 2 days.  Associated with generalized fatigue, weakness, and intermittent headache.  Upon presentation to the ED, she is febrile with T-max 103.1, negative UA and negative chest x-ray.  ID consulted, pan Ct'd.  CT chest showed right axillary fluid collection.  MRI brain with and without contrast was negative for any acute intracranial findings.  Seen by IR for aspiration of postop breast fluid collection, completed on 10/14/2021.  Cultures remain negative.   Sepsis of unknown source Presented with 2 days of fever generalized fatigue and weakness T-max 103.1 in the ED. Respiration rate 30. UA and chest x-ray negative CT chest/abdomen pelvis with contrast revealed fluid collection in the right axilla and posterior right breast question postoperative such as seroma, hematoma or lymphocele, infection not excluded by CT. IR were consulted, and breast fluid has been aspirated by IR, cultures and gram stain remains negative, she was treated empirically with IV vancomycin and  Rocephin, she has been followed closely by ID, at this point recommendation is to discharge on oral antibiotics including doxycycline and cefdinir to finish total of 14 days, and she will follow with general surgery as an outpatient where they can repeat an ultrasound to evaluate for resolution of these fluid collections on antibiotics.  Stage I right breast cancer, follows with medical oncology Dr. Burr Medico Post radiation, currently on tamoxifen Medical oncology Dr. Burr Medico, following, appreciate assistance.   Intermittent headache Follow MRI brain with and without contrast negative for any acute intracranial findings.    improved today   Paroxysmal A. fib on Eliquis Rate controlled on Toprol-XL. Continue Eliquis for CVA prevention    Chronic anxiety/depression Continue home Effexor   Type 2 diabetes with diabetic polyneuropathy Last hemoglobin A1c 6.7 on 02/13/2021.    GERD Continue home PPI twice daily.   Iron deficiency anemia/anemia of chronic disease IV Feraheme infusion completed on 10/14/2021.   Resolved Hypophosphatemia Repleted orally Serum phosphorus 3.4 from 2.1.  Discharge Diagnoses:  Principal Problem:   Sepsis ([place]) Active Problems:   Malignant neoplasm of upper-outer quadrant of right breast in female, estrogen receptor positive ([place])   Persistent atrial fibrillation (HCC)   Iron deficiency anemia   Fever   Nausea   Headache   Generalized weakness   Diabetes (HCC)   Hypertension   GERD (gastroesophageal reflux disease)   Breast abscess    Discharge Instructions  Discharge Instructions     Diet - low sodium heart healthy   Complete by: As directed    Increase activity slowly   Complete by: As directed    No wound care   Complete by: As directed       Allergies as of  10/17/2021       Reactions   Lotensin [benazepril Hcl] Swelling   Morphine Itching, Rash   Erythromycin Base Other (See Comments)   Unknown reaction   Hydrocodone Other (See  Comments)   Unknown reaction   Other Swelling   Mustard - vaginal swelling/pain   Banana Swelling   vaginal swelling/pain   Chocolate Swelling   vaginal swelling/pain   Gluten Meal Diarrhea   Upset stomach    Tizanidine Other (See Comments)   Lowered blood pressure    Wound Dressing Adhesive Other (See Comments)   Skin burns, blisters         Medication List     STOP taking these medications    ibuprofen 200 MG tablet Commonly known as: ADVIL       TAKE these medications    acetaminophen 500 MG tablet Commonly known as: TYLENOL Take 500 mg by mouth every 6 (six) hours as needed for moderate pain, headache or mild pain.   albuterol 108 (90 Base) MCG/ACT inhaler Commonly known as: VENTOLIN HFA Inhale 1-2 puffs into the lungs every 6 (six) hours as needed for shortness of breath or wheezing (Asthma).   apixaban 5 MG Tabs tablet Commonly known as: ELIQUIS Take 1 tablet (5 mg total) by mouth 2 (two) times daily. Pt needs 90 day supply.  Please keep on file till next refill.   calcipotriene 0.005 % cream Commonly known as: DOVONOX Apply 1 application topically See admin instructions. Mix with fluorouracil 5% cream and apply topically twice daily for five days - as needed for precancerous on right cheek.   cefdinir 300 MG capsule Commonly known as: OMNICEF Take 1 capsule (300 mg total) by mouth 2 (two) times daily for 10 days.   cetirizine 10 MG tablet Commonly known as: ZYRTEC Take 10 mg by mouth in the morning.   cholecalciferol 25 MCG (1000 UNIT) tablet Commonly known as: VITAMIN D3 Take 1,000-2,000 Units by mouth See admin instructions. Take 1000 units in the morning and 2000 units at bedtime   doxycycline 100 MG tablet Commonly known as: VIBRA-TABS Take 1 tablet (100 mg total) by mouth 2 (two) times daily for 10 days.   ferrous sulfate 325 (65 FE) MG tablet Take 325 mg by mouth every morning.   fluorouracil 5 % cream Commonly known as: EFUDEX Apply 1  application topically 2 (two) times daily as needed (precancerous spots on right cheek). Mix with calcipotriene 0.005% cream and apply topically twice daily for five days - as needed for precancerous on right cheek.   gabapentin 100 MG capsule Commonly known as: NEURONTIN Take 200 mg by mouth at bedtime.   losartan-hydrochlorothiazide 100-25 MG tablet Commonly known as: Hyzaar Take 1 tablet by mouth daily.   metFORMIN 500 MG 24 hr tablet Commonly known as: GLUCOPHAGE-XR Take 500 mg by mouth daily with supper.   metoprolol succinate 25 MG 24 hr tablet Commonly known as: Toprol XL Take 1 tablet (25 mg total) by mouth daily.   pantoprazole 40 MG tablet Commonly known as: PROTONIX Take 40 mg by mouth 2 (two) times daily before a meal.   potassium chloride SA 20 MEQ tablet Commonly known as: Klor-Con M20 Take 1 tablet (20 mEq total) by mouth daily.   PRENATAL VITAMIN PLUS LOW IRON PO Take 1 tablet by mouth in the morning.   SUMAtriptan 100 MG tablet Commonly known as: IMITREX Take 100 mg by mouth See admin instructions. Take one tablet (100 mg) by mouth at  onset of migraine headache, may repeat in 2 hours if headache persists or recurs.   tamoxifen 20 MG tablet Commonly known as: NOLVADEX TAKE 1 TABLET(20 MG) BY MOUTH DAILY What changed: See the new instructions.   triamcinolone cream 0.1 % Commonly known as: KENALOG Apply 1 application topically 2 (two) times daily as needed (psoriasis). Mixed with Cetaphil 1:1   venlafaxine XR 75 MG 24 hr capsule Commonly known as: EFFEXOR-XR Take 75 mg by mouth at bedtime. Take 1 capsule (75 mg) + 1 capsule (37.5 mg) to equal 112.5 mg  at bedtime   venlafaxine XR 37.5 MG 24 hr capsule Commonly known as: EFFEXOR-XR Take 37.5 mg by mouth at bedtime. Take 1 capsule (75 mg) + 1 capsule (37.5 mg) to equal 112.5 mg at bedtime   vitamin B-12 1000 MCG tablet Commonly known as: CYANOCOBALAMIN Take 1,000 mcg by mouth every morning.   vitamin  C 500 MG tablet Commonly known as: ASCORBIC ACID Take 500 mg by mouth daily.        Follow-up Information     [name], MD Follow up on 10/28/2021.   Specialty: General Surgery Why: 11:15am. Please arrive 30 minutes prior to your appointment for paperwork. Please bring a copy of your photo ID and insurance card. We will arrange for an outpatient ultrasound to be performed prior to your appointment. Contact information: [address] [place] [address] [phone]                Allergies  Allergen Reactions   Lotensin [Benazepril Hcl] Swelling   Morphine Itching and Rash   Erythromycin Base Other (See Comments)    Unknown reaction   Hydrocodone Other (See Comments)    Unknown reaction     Other Swelling    Mustard - vaginal swelling/pain    Banana Swelling    vaginal swelling/pain   Chocolate Swelling    vaginal swelling/pain   Gluten Meal Diarrhea    Upset stomach    Tizanidine Other (See Comments)    Lowered blood pressure    Wound Dressing Adhesive Other (See Comments)    Skin burns, blisters     Consultations: Infectious disease Medical oncology General surgery     Procedures/Studies: DG Chest 2 View  Result Date: 10/12/2021 CLINICAL DATA:  Chest pain, shortness of breath dizziness EXAM: CHEST - 2 VIEW COMPARISON:  02/04/2008 FINDINGS: Cardiac size is within normal limits. There are no signs of pulmonary edema or focal pulmonary consolidation. There are small linear densities in the left lower lung fields suggesting subsegmental atelectasis or scarring. There is no pleural effusion or pneumothorax. There is possible 3 mm calcific density in the left mid lung fields. Surgical clips are seen in the right axilla. IMPRESSION: There are no signs of pulmonary edema or focal pulmonary consolidation. Small transverse linear densities in the left lower lung fields suggest scarring or subsegmental atelectasis. Electronically Signed   By:  Elmer Picker M.D.   On: 10/12/2021 14:53   MR BRAIN W WO CONTRAST  Result Date: 10/13/2021 CLINICAL DATA:  Headache, new or worsening EXAM: MRI HEAD WITHOUT AND WITH CONTRAST TECHNIQUE: Multiplanar, multiecho pulse sequences of the brain and surrounding structures were obtained without and with intravenous contrast. CONTRAST:  60mL GADAVIST GADOBUTROL 1 MMOL/ML IV SOLN COMPARISON:  None. FINDINGS: Brain: No restricted diffusion to suggest acute or subacute infarct. No acute hemorrhage, hydrocephalus, extra-axial collection, or mass lesion. The ventricles and sulci are within normal limits for age.  No abnormal enhancement. Vascular: Normal flow voids. Skull and upper cervical spine: Normal marrow signal. Sinuses/Orbits: Mucosal thickening in the right maxillary sinus. The orbits are unremarkable. Other: Trace fluid in left mastoid air cells. IMPRESSION: No acute intracranial process. No etiology is seen for the patient's headache. Electronically Signed   By: Merilyn Baba M.D.   On: 10/13/2021 20:46   CT CHEST ABDOMEN PELVIS W CONTRAST  Result Date: 10/13/2021 CLINICAL DATA:  Chronic iron deficiency anemia, paroxysmal atrial fibrillation, type II diabetes mellitus, sepsis of unknown source, past history breast cancer, hypertension, GERD EXAM: CT CHEST, ABDOMEN, AND PELVIS WITH CONTRAST TECHNIQUE: Multidetector CT imaging of the chest, abdomen and pelvis was performed following the standard protocol during bolus administration of intravenous contrast. CONTRAST:  168mL OMNIPAQUE IOHEXOL 300 MG/ML  SOLN COMPARISON:  CT abdomen and pelvis 08/14/2005 FINDINGS: CT CHEST FINDINGS Cardiovascular: Atherosclerotic calcifications aorta and coronary arteries. Aorta normal caliber. Pulmonary vascular structures grossly patent on non targeted exam. Heart unremarkable. No pericardial effusion. Mediastinum/Nodes: Esophagus normal appearance. Base of cervical region normal appearance. No thoracic adenopathy. Surgical  clips RIGHT RIGHT axilla. Focal fluid collection RIGHT axilla 4.9 x 3.8 cm, may represent a postoperative collection such as hematoma, seroma or lymphocele, less likely necrotic lymph node. A second fluid collection is seen in the posterior RIGHT breast 2.9 x 2.7 cm. Lungs/Pleura: Tiny calcified granulomata at lateral RIGHT lung base. Lungs otherwise clear. No pulmonary infiltrate, pleural effusion, or pneumothorax. Musculoskeletal: Diffuse osseous demineralization. CT ABDOMEN PELVIS FINDINGS Hepatobiliary: Gallbladder surgically absent. Liver normal appearance Pancreas: Normal appearance Spleen: Normal appearance. Small probable splenule anterior to spleen. Adrenals/Urinary Tract: Adrenal glands normal appearance. Kidneys, ureters, and bladder normal appearance Stomach/Bowel: Appendix not visualized, no pericecal inflammatory process seen. Small hiatal hernia. Stomach and bowel loops otherwise normal appearance. Vascular/Lymphatic: Atherosclerotic calcifications aorta and iliac arteries without aneurysm. Vascular structures patent. No adenopathy. Reproductive: Uterus surgically absent with nonvisualization of ovaries Other: No free air or free fluid. No hernia or inflammatory process. Musculoskeletal: Mild osseous demineralization. IMPRESSION: No acute intrathoracic, intra-abdominal or intrapelvic abnormalities. Small hiatal hernia. Fluid collections in the RIGHT axilla and posterior RIGHT breast question postoperative such as seroma, hematoma, or lymphocele; infection not excluded by CT. Aortic Atherosclerosis (ICD10-I70.0). Electronically Signed   By: Lavonia Dana M.D.   On: 10/13/2021 12:38   [place] IMAGE GUIDED DRAINAGE BY PERCUTANEOUS CATHETER  Result Date: 10/14/2021 INDICATION: Infection, unknown etiology. RIGHT breast and axillary collections on CT. EXAM: US-GUIDED DRAINAGE OF RIGHT AXILLARY FLUID COLLECTION COMPARISON:  CT CAP, 10/13/2021. MEDICATIONS: The patient is currently admitted to the hospital and  receiving intravenous antibiotics. The antibiotics were administered within an appropriate time frame prior to the initiation of the procedure. ANESTHESIA/SEDATION: Local anesthetic was administered. CONTRAST:  None COMPLICATIONS: None immediate. PROCEDURE: Informed written consent was obtained from the patient after a discussion of the risks, benefits and alternatives to treatment. Preprocedural ultrasound scanning demonstrated well-circumscribed, mildly complex RIGHT axillary fluid collection. A timeout was performed prior to the initiation of the procedure. The RIGHT chest and axilla was prepped and draped in the usual sterile fashion. The overlying soft tissues were anesthetized with 1% lidocaine with epinephrine. Under direct ultrasound guidance, a 18 gauge 10 cm Yueh needle was advanced into the abscess/fluid collection. Multiple ultrasound images were saved for procedural documentation purposes. Next, approximately 5 mL serous fluid was aspirated from the collection. A representative sample of aspirated fluid was capped and sent to the laboratory for analysis. The needle was removed and superficial  hemostasis was achieved with manual compression. A dressing was placed. The patient tolerated the procedure well without immediate postprocedural complication. IMPRESSION: Successful US-guided aspiration of RIGHT axillary fluid collection. A representative aspirated sample was sent to the laboratory as requested by the ordering clinical team. Michaelle Birks, MD [facility] Electronically Signed   By: Michaelle Birks M.D.   On: 10/14/2021 15:40      Subjective:  He denies any fever, chills, no nausea, no vomiting, still reports some tenderness in the right axillary area Discharge Exam: Vitals:   10/17/21 0440 10/17/21 0759  BP: (!) 134/94 (!) 142/87  Pulse: 74   Resp: 16 19  Temp: 98 F (36.7 C) 98.2 F (36.8 C)  SpO2: 98% 100%   Vitals:    10/16/21 2343 10/17/21 0440 10/17/21 0442 10/17/21 0759  BP: (!) 151/89 (!) 134/94  (!) 142/87  Pulse: 96 74    Resp: 16 16  19   Temp: 98.3 F (36.8 C) 98 F (36.7 C)  98.2 F (36.8 C)  TempSrc: Oral Oral  Oral  SpO2: 97% 98%  100%  Weight:   92.7 kg   Height:        General: Pt is alert, awake, not in acute distress Cardiovascular: RRR, S1/S2 +, no rubs, no gallops Respiratory: CTA bilaterally, no wheezing, no rhonchi Abdominal: Soft, NT, ND, bowel sounds + Extremities: no edema, no cyanosis    The results of significant diagnostics from this hospitalization (including imaging, microbiology, ancillary and laboratory) are listed below for reference.     Microbiology: Recent Results (from the past 240 hour(s))  Resp Panel by RT-PCR (Flu A&B, Covid) Nasopharyngeal Swab     Status: None   Collection Time: 10/12/21  2:13 PM   Specimen: Nasopharyngeal Swab; Nasopharyngeal(NP) swabs in vial transport medium  Result Value Ref Range Status   SARS Coronavirus 2 by RT PCR NEGATIVE NEGATIVE Final    Comment: (NOTE) SARS-CoV-2 target nucleic acids are NOT DETECTED.  The SARS-CoV-2 RNA is generally detectable in upper respiratory specimens during the acute phase of infection. The lowest concentration of SARS-CoV-2 viral copies this assay can detect is 138 copies/mL. A negative result does not preclude SARS-Cov-2 infection and should not be used as the sole basis for treatment or other patient management decisions. A negative result may occur with  improper specimen collection/handling, submission of specimen other than nasopharyngeal swab, presence of viral mutation(s) within the areas targeted by this assay, and inadequate number of viral copies(<138 copies/mL). A negative result must be combined with clinical observations, patient history, and epidemiological information. The expected result is Negative.  Fact Sheet for Patients:   EntrepreneurPulse.com.au  Fact Sheet for Healthcare Providers:  IncredibleEmployment.be  This test is no t yet approved or cleared by the [place] FDA and  has been authorized for detection and/or diagnosis of SARS-CoV-2 by FDA under an Emergency Use Authorization (EUA). This EUA will remain  in effect (meaning this test can be used) for the duration of the COVID-19 declaration under Section 564(b)(1) of the Act, 21 U.S.C.section 360bbb-3(b)(1), unless the authorization is terminated  or revoked sooner.       Influenza A by PCR NEGATIVE NEGATIVE Final   Influenza B by PCR NEGATIVE NEGATIVE Final    Comment: (NOTE) The Xpert Xpress SARS-CoV-2/FLU/RSV plus assay is intended as an aid in the diagnosis of influenza from Nasopharyngeal swab specimens and should not be used as a sole basis for treatment. Nasal washings  and aspirates are unacceptable for Xpert Xpress SARS-CoV-2/FLU/RSV testing.  Fact Sheet for Patients: EntrepreneurPulse.com.au  Fact Sheet for Healthcare Providers: IncredibleEmployment.be  This test is not yet approved or cleared by the [place] FDA and has been authorized for detection and/or diagnosis of SARS-CoV-2 by FDA under an Emergency Use Authorization (EUA). This EUA will remain in effect (meaning this test can be used) for the duration of the COVID-19 declaration under Section 564(b)(1) of the Act, 21 U.S.C. section 360bbb-3(b)(1), unless the authorization is terminated or revoked.  Performed at [facility], [place] [address], [place], [place] [address]   Blood culture (routine x 2)     Status: None   Collection Time: 10/12/21  4:39 PM   Specimen: BLOOD LEFT HAND  Result Value Ref Range Status   Specimen Description BLOOD LEFT HAND  Final   Special Requests   Final    BOTTLES DRAWN AEROBIC AND ANAEROBIC Blood Culture results may not be optimal due to an inadequate  volume of blood received in culture bottles   Culture   Final    NO GROWTH 5 DAYS Performed at [facility], [place] [address], [place], [place] [address]    Report Status 10/17/2021 FINAL  Final  Blood culture (routine x 2)     Status: None (Preliminary result)   Collection Time: 10/13/21  5:48 AM   Specimen: BLOOD LEFT HAND  Result Value Ref Range Status   Specimen Description BLOOD LEFT HAND  Final   Special Requests AEROBIC BOTTLE ONLY Blood Culture adequate volume  Final   Culture   Final    NO GROWTH 4 DAYS Performed at [facility], [place] [address] [place], [place] [address]    Report Status PENDING  Incomplete  MRSA Next Gen by PCR, Nasal     Status: None   Collection Time: 10/14/21  9:24 AM   Specimen: Nasal Mucosa; Nasal Swab  Result Value Ref Range Status   MRSA by PCR Next Gen NOT DETECTED NOT DETECTED Final    Comment: (NOTE) The GeneXpert MRSA Assay (FDA approved for NASAL specimens only), is one component of a comprehensive MRSA colonization surveillance program. It is not intended to diagnose MRSA infection nor to guide or monitor treatment for MRSA infections. Test performance is not FDA approved in patients less than 53 years old. Performed at [facility], [place] [address] [place], [place] [address]   Aerobic/Anaerobic Culture w Gram Stain (surgical/deep wound)     Status: None (Preliminary result)   Collection Time: 10/14/21 11:57 AM   Specimen: Wound; Serous Fluid  Result Value Ref Range Status   Specimen Description WOUND RIGHT AXILLA  Final   Special Requests ASPIRATE  Final   Gram Stain   Final    FEW WBC PRESENT, PREDOMINANTLY MONONUCLEAR NO ORGANISMS SEEN    Culture   Final    NO GROWTH 3 DAYS NO ANAEROBES ISOLATED; CULTURE IN PROGRESS FOR 5 DAYS Performed at [facility], [address], [place], [place] [address]    Report Status PENDING  Incomplete     Labs: BNP (last 3 results) Recent Labs    10/12/21 1639  BNP  193.7*   Basic Metabolic Panel: Recent Labs  Lab 10/13/21 0548 10/14/21 0219 10/15/21 0601 10/16/21 0250 10/17/21 0211  NA 135 138 137 136 138  K 3.1* 3.7 3.8 3.6 3.9  CL 103 105 105 103 102  CO2 22 26 24 25  23  GLUCOSE 174* 137* 148* 123* 120*  BUN 11 8 9 9 10   CREATININE 0.82 0.71 0.73 0.70 0.88  CALCIUM 7.8* 8.0* 8.3* 8.4* 8.7*  MG 1.5*  1.5* 2.1  --  1.9  --   PHOS  --  2.1* 3.4 4.4  --    Liver Function Tests: Recent Labs  Lab 10/13/21 0548 10/14/21 0219  AST 26  26 23   ALT 19  19 24   ALKPHOS 49  50 47  BILITOT 0.8  0.9 0.4  PROT 6.1*  6.0* 6.1*  ALBUMIN 2.8*  2.8* 2.7*   No results for input(s): LIPASE, AMYLASE in the last 168 hours. No results for input(s): AMMONIA in the last 168 hours. CBC: Recent Labs  Lab 10/13/21 0548 10/14/21 0219 10/15/21 0601 10/16/21 0250 10/17/21 0211  WBC 11.6* 7.8 6.2 5.3 5.4  NEUTROABS 8.5* 5.4 4.4 3.4 3.2  HGB 10.7* 10.4* 9.6* 9.5* 9.8*  HCT 34.4* 33.3* 30.4* 29.8* 31.1*  MCV 90.5 91.2 90.2 90.9 90.7  PLT 152 154 156 158 184   Cardiac Enzymes: No results for input(s): CKTOTAL, CKMB, CKMBINDEX, TROPONINI in the last 168 hours. BNP: Invalid input(s): POCBNP CBG: Recent Labs  Lab 10/16/21 0826 10/16/21 1154 10/16/21 1638 10/16/21 2044 10/17/21 0801  GLUCAP 125* 131* 127* 120* 119*   D-Dimer No results for input(s): DDIMER in the last 72 hours. Hgb A1c No results for input(s): HGBA1C in the last 72 hours. Lipid Profile No results for input(s): CHOL, HDL, LDLCALC, TRIG, CHOLHDL, LDLDIRECT in the last 72 hours. Thyroid function studies No results for input(s): TSH, T4TOTAL, T3FREE, THYROIDAB in the last 72 hours.  Invalid input(s): FREET3 Anemia work up No results for input(s): VITAMINB12, FOLATE, FERRITIN, TIBC, IRON, RETICCTPCT in the last 72 hours. Urinalysis    Component Value Date/Time   COLORURINE YELLOW 10/12/2021 1946   APPEARANCEUR CLEAR 10/12/2021 1946   LABSPEC 1.008 10/12/2021 1946    PHURINE 6.0 10/12/2021 1946   GLUCOSEU NEGATIVE 10/12/2021 1946   HGBUR NEGATIVE 10/12/2021 1946   BILIRUBINUR NEGATIVE 10/12/2021 1946   KETONESUR NEGATIVE 10/12/2021 1946   PROTEINUR NEGATIVE 10/12/2021 1946   NITRITE NEGATIVE 10/12/2021 1946   LEUKOCYTESUR NEGATIVE 10/12/2021 1946   Sepsis Labs Invalid input(s): PROCALCITONIN,  WBC,  LACTICIDVEN Microbiology Recent Results (from the past 240 hour(s))  Resp Panel by RT-PCR (Flu A&B, Covid) Nasopharyngeal Swab     Status: None   Collection Time: 10/12/21  2:13 PM   Specimen: Nasopharyngeal Swab; Nasopharyngeal(NP) swabs in vial transport medium  Result Value Ref Range Status   SARS Coronavirus 2 by RT PCR NEGATIVE NEGATIVE Final    Comment: (NOTE) SARS-CoV-2 target nucleic acids are NOT DETECTED.  The SARS-CoV-2 RNA is generally detectable in upper respiratory specimens during the acute phase of infection. The lowest concentration of SARS-CoV-2 viral copies this assay can detect is 138 copies/mL. A negative result does not preclude SARS-Cov-2 infection and should not be used as the sole basis for treatment or other patient management decisions. A negative result may occur with  improper specimen collection/handling, submission of specimen other than nasopharyngeal swab, presence of viral mutation(s) within the areas targeted by this assay, and inadequate number of viral copies(<138 copies/mL). A negative result must be combined with clinical observations, patient history, and epidemiological information. The expected result is Negative.  Fact Sheet for Patients:  EntrepreneurPulse.com.au  Fact Sheet for Healthcare Providers:  IncredibleEmployment.be  This test is no t yet approved or cleared by the [place] and  has been authorized for detection and/or diagnosis of SARS-CoV-2 by FDA under an Emergency Use Authorization (EUA). This EUA will remain  in effect (meaning this test can  be used) for the duration of the COVID-19 declaration under Section 564(b)(1) of the Act, 21 U.S.C.section 360bbb-3(b)(1), unless the authorization is terminated  or revoked sooner.       Influenza A by PCR NEGATIVE NEGATIVE Final   Influenza B by PCR NEGATIVE NEGATIVE Final    Comment: (NOTE) The Xpert Xpress SARS-CoV-2/FLU/RSV plus assay is intended as an aid in the diagnosis of influenza from Nasopharyngeal swab specimens and should not be used as a sole basis for treatment. Nasal washings and aspirates are unacceptable for Xpert Xpress SARS-CoV-2/FLU/RSV testing.  Fact Sheet for Patients: EntrepreneurPulse.com.au  Fact Sheet for Healthcare Providers: IncredibleEmployment.be  This test is not yet approved or cleared by the [place] FDA and has been authorized for detection and/or diagnosis of SARS-CoV-2 by FDA under an Emergency Use Authorization (EUA). This EUA will remain in effect (meaning this test can be used) for the duration of the COVID-19 declaration under Section 564(b)(1) of the Act, 21 U.S.C. section 360bbb-3(b)(1), unless the authorization is terminated or revoked.  Performed at [facility], [place] [address], [place], [place] [address]   Blood culture (routine x 2)     Status: None   Collection Time: 10/12/21  4:39 PM   Specimen: BLOOD LEFT HAND  Result Value Ref Range Status   Specimen Description BLOOD LEFT HAND  Final   Special Requests   Final    BOTTLES DRAWN AEROBIC AND ANAEROBIC Blood Culture results may not be optimal due to an inadequate volume of blood received in culture bottles   Culture   Final    NO GROWTH 5 DAYS Performed at [facility], [place] [address] [place], [place] [address]    Report Status 10/17/2021 FINAL  Final  Blood culture (routine x 2)     Status: None (Preliminary result)   Collection Time: 10/13/21  5:48 AM   Specimen: BLOOD LEFT HAND  Result Value Ref Range Status    Specimen Description BLOOD LEFT HAND  Final   Special Requests AEROBIC BOTTLE ONLY Blood Culture adequate volume  Final   Culture   Final    NO GROWTH 4 DAYS Performed at [facility], [place] [address] [place], [place] [address]    Report Status PENDING  Incomplete  MRSA Next Gen by PCR, Nasal     Status: None   Collection Time: 10/14/21  9:24 AM   Specimen: Nasal Mucosa; Nasal Swab  Result Value Ref Range Status   MRSA by PCR Next Gen NOT DETECTED NOT DETECTED Final    Comment: (NOTE) The GeneXpert MRSA Assay (FDA approved for NASAL specimens only), is one component of a comprehensive MRSA colonization surveillance program. It is not intended to diagnose MRSA infection nor to guide or monitor treatment for MRSA infections. Test performance is not FDA approved in patients less than 43 years old. Performed at [facility], [place] [address], [place], [place] [address]   Aerobic/Anaerobic Culture w Gram Stain (surgical/deep wound)     Status: None (Preliminary result)   Collection Time: 10/14/21 11:57 AM   Specimen: Wound; Serous Fluid  Result Value Ref Range Status   Specimen Description WOUND RIGHT AXILLA  Final   Special Requests ASPIRATE  Final   Gram Stain   Final    FEW WBC PRESENT,  PREDOMINANTLY MONONUCLEAR NO ORGANISMS SEEN    Culture   Final    NO GROWTH 3 DAYS NO ANAEROBES ISOLATED; CULTURE IN PROGRESS FOR 5 DAYS Performed at [facility], [place] [address] [place], [place] [address]    Report Status PENDING  Incomplete     Time coordinating discharge: Over 30 minutes  SIGNED:   Phillips Climes, MD  [facility] 10/17/2021, 10:02 AM Pager   If 7PM-7AM, please contact night-coverage [email or web address] Password [email or web address]

## 2021-10-17 NOTE — Progress Notes (Signed)
Subjective: CC: Pain in right axilla and right breast stable. Afebrile overnight. WBC 5.4  Objective: Vital signs in last 24 hours: Temp:  [97.7 F (36.5 C)-98.9 F (37.2 C)] 98.2 F (36.8 C) (12/01 0759) Pulse Rate:  [74-96] 74 (12/01 0440) Resp:  [16-19] 19 (12/01 0759) BP: (134-157)/(69-94) 142/87 (12/01 0759) SpO2:  [97 %-100 %] 100 % (12/01 0759) Weight:  [92.7 kg] 92.7 kg (12/01 0442) Last BM Date: 10/17/21  Intake/Output from previous day: 11/30 0701 - 12/01 0700 In: 480 [P.O.:480] Out: -  Intake/Output this shift: No intake/output data recorded.  PE: Chest wall: Chaperone present. There is prominence/firmness just anterior/inferior to the R axillary incision that extends to the right upper lateral quadrant of the breast and resolves prior to areolar tissue.  There is no obvious cellulitis or skin changes on exam. No drainage. This is stable from yesterday.   Lab Results:  Recent Labs    10/16/21 0250 10/17/21 0211  WBC 5.3 5.4  HGB 9.5* 9.8*  HCT 29.8* 31.1*  PLT 158 184   BMET Recent Labs    10/16/21 0250 10/17/21 0211  NA 136 138  K 3.6 3.9  CL 103 102  CO2 25 23  GLUCOSE 123* 120*  BUN 9 10  CREATININE 0.70 0.88  CALCIUM 8.4* 8.7*   PT/INR No results for input(s): LABPROT, INR in the last 72 hours. CMP     Component Value Date/Time   NA 138 10/17/2021 0211   NA 141 07/10/2021 1102   K 3.9 10/17/2021 0211   CL 102 10/17/2021 0211   CO2 23 10/17/2021 0211   GLUCOSE 120 (H) 10/17/2021 0211   BUN 10 10/17/2021 0211   BUN 17 07/10/2021 1102   CREATININE 0.88 10/17/2021 0211   CREATININE 0.83 09/09/2021 1410   CALCIUM 8.7 (L) 10/17/2021 0211   PROT 6.1 (L) 10/14/2021 0219   ALBUMIN 2.7 (L) 10/14/2021 0219   AST 23 10/14/2021 0219   AST 33 09/09/2021 1410   ALT 24 10/14/2021 0219   ALT 22 09/09/2021 1410   ALKPHOS 47 10/14/2021 0219   BILITOT 0.4 10/14/2021 0219   BILITOT 0.9 09/09/2021 1410   GFRNONAA >60 10/17/2021 0211    GFRNONAA >60 09/09/2021 1410   Lipase  No results found for: LIPASE  Studies/Results: No results found.  Anti-infectives: Anti-infectives (From admission, onward)    Start     Dose/Rate Route Frequency Ordered Stop   10/17/21 0000  cefdinir (OMNICEF) 300 MG capsule        300 mg Oral 2 times daily 10/17/21 0916 10/28/21 2359   10/17/21 0000  doxycycline (VIBRA-TABS) 100 MG tablet        100 mg Oral 2 times daily 10/17/21 0916 10/27/21 2359   10/16/21 1515  cefdinir (OMNICEF) capsule 300 mg        300 mg Oral Every 12 hours 10/16/21 1427     10/16/21 1515  doxycycline (VIBRA-TABS) tablet 100 mg        100 mg Oral Every 12 hours 10/16/21 1427     10/14/21 1800  cefTRIAXone (ROCEPHIN) 2 g in sodium chloride 0.9 % 100 mL IVPB  Status:  Discontinued        2 g 200 mL/hr over 30 Minutes Intravenous Every 24 hours 10/14/21 1014 10/16/21 1427   10/14/21 0700  vancomycin (VANCOREADY) IVPB 1000 mg/200 mL  Status:  Discontinued        1,000 mg 200 mL/hr over 60  Minutes Intravenous Daily 10/14/21 0612 10/16/21 1427   10/13/21 2300  vancomycin (VANCOREADY) IVPB 1000 mg/200 mL  Status:  Discontinued        1,000 mg 200 mL/hr over 60 Minutes Intravenous Every 24 hours 10/12/21 2025 10/13/21 1022   10/13/21 1800  cefTRIAXone (ROCEPHIN) 1 g in sodium chloride 0.9 % 100 mL IVPB  Status:  Discontinued        1 g 200 mL/hr over 30 Minutes Intravenous Every 24 hours 10/12/21 1947 10/14/21 1014   10/12/21 2015  vancomycin (VANCOREADY) IVPB 1750 mg/350 mL        1,750 mg 175 mL/hr over 120 Minutes Intravenous  Once 10/12/21 2012 10/12/21 2325   10/12/21 1900  cefTRIAXone (ROCEPHIN) 2 g in sodium chloride 0.9 % 100 mL IVPB        2 g 200 mL/hr over 30 Minutes Intravenous  Once 10/12/21 1859 10/12/21 1930   10/12/21 1900  fluconazole (DIFLUCAN) tablet 200 mg        200 mg Oral  Once 10/12/21 1859 10/12/21 1945        Assessment/Plan R axillary and R breast fluid collections  Prior hx of R  breast cancer s/p R lumpectomy and sentinel lymph node bx by Dr. Barry Dienes on 02/18/21 (margins negative, cT1cN0, her2 negative) and radiation, now on Tamoxifen    - CT w/ 4.9 x 3.8 cm fluid collection of the right axilla and 2.9 x 2.7 cm fluid collection of the posterior right breast - s/p US guided aspiration of the right axillary fluid collection yielding 11m serous fluid by IR. Cx's pending from this and currently NGTD.  - On exam patient does have some prominence/firmness just anterior/inferior to the axillary incision that extends to the right upper lateral quadrant of the breast and resolves prior to areolar tissue.  There is no obvious cellulitis or skin changes on exam.   - Discussed case with Dr. BBarry Dienes Our team would not recommend drainage of breast fluid collection at this time. Recommend 2 weeks of abx (cx's pending from R axillary fluid collection aspiration) > outpatient follow up [place] re-eval > follow up in the office for a recheck. Our office will arrange [place] follow [place]. - Ok for d/c from our standpoint with above plan.    FEN - Reg VTE - SCDs, on Eliquis ID - Rocephin/Vanc 11/26 - 11/30. Doxy/Cefdinir    Fever HA - MRI brain negative  PAF on Eliquis (hx of cardioversion 9/28 and 11/3) DM2 B12/Iron deficiency anemia   LOS: 5 days    MJillyn Ledger, PNatural [facility] LlLPS[facility] 10/17/2021, 10:17 AM Please see Amion for pager number during day hours 7:00am-4:30pm

## 2021-10-17 NOTE — Progress Notes (Signed)
Subjective:  Patient is feeling better today   Antibiotics:  Anti-infectives (From admission, onward)    Start     Dose/Rate Route Frequency Ordered Stop   10/16/21 1515  cefdinir (OMNICEF) capsule 300 mg        300 mg Oral Every 12 hours 10/16/21 1427     10/16/21 1515  doxycycline (VIBRA-TABS) tablet 100 mg        100 mg Oral Every 12 hours 10/16/21 1427     10/14/21 1800  cefTRIAXone (ROCEPHIN) 2 g in sodium chloride 0.9 % 100 mL IVPB  Status:  Discontinued        2 g 200 mL/hr over 30 Minutes Intravenous Every 24 hours 10/14/21 1014 10/16/21 1427   10/14/21 0700  vancomycin (VANCOREADY) IVPB 1000 mg/200 mL  Status:  Discontinued        1,000 mg 200 mL/hr over 60 Minutes Intravenous Daily 10/14/21 0612 10/16/21 1427   10/13/21 2300  vancomycin (VANCOREADY) IVPB 1000 mg/200 mL  Status:  Discontinued        1,000 mg 200 mL/hr over 60 Minutes Intravenous Every 24 hours 10/12/21 2025 10/13/21 1022   10/13/21 1800  cefTRIAXone (ROCEPHIN) 1 g in sodium chloride 0.9 % 100 mL IVPB  Status:  Discontinued        1 g 200 mL/hr over 30 Minutes Intravenous Every 24 hours 10/12/21 1947 10/14/21 1014   10/12/21 2015  vancomycin (VANCOREADY) IVPB 1750 mg/350 mL        1,750 mg 175 mL/hr over 120 Minutes Intravenous  Once 10/12/21 2012 10/12/21 2325   10/12/21 1900  cefTRIAXone (ROCEPHIN) 2 g in sodium chloride 0.9 % 100 mL IVPB        2 g 200 mL/hr over 30 Minutes Intravenous  Once 10/12/21 1859 10/12/21 1930   10/12/21 1900  fluconazole (DIFLUCAN) tablet 200 mg        200 mg Oral  Once 10/12/21 1859 10/12/21 1945       Medications: Scheduled Meds:  apixaban  5 mg Oral BID   cefdinir  300 mg Oral Q12H   doxycycline  100 mg Oral Q12H   gabapentin  200 mg Oral QHS   losartan  100 mg Oral Daily   And   hydrochlorothiazide  25 mg Oral Daily   insulin aspart  0-6 Units Subcutaneous TID WC   loratadine  10 mg Oral Daily   metoprolol succinate  25 mg Oral Daily    pantoprazole  40 mg Oral BID AC   senna-docusate  2 tablet Oral Q0600   tamoxifen  20 mg Oral Daily   venlafaxine XR  75 mg Oral QHS   And   venlafaxine XR  37.5 mg Oral QHS   Continuous Infusions:   PRN Meds:.acetaminophen **OR** acetaminophen, albuterol, HYDROcodone-acetaminophen, ibuprofen, ondansetron (ZOFRAN) IV    Objective: Weight change: 0.4 kg  Intake/Output Summary (Last 24 hours) at 10/17/2021 0913 Last data filed at 10/16/2021 2000 Gross per 24 hour  Intake 240 ml  Output --  Net 240 ml    Blood pressure (!) 142/87, pulse 74, temperature 98.2 F (36.8 C), temperature source Oral, resp. rate 19, height 5\' 2"  (1.575 m), weight 92.7 kg, SpO2 100 %. Temp:  [97.7 F (36.5 C)-98.9 F (37.2 C)] 98.2 F (36.8 C) (12/01 0759) Pulse Rate:  [74-96] 74 (12/01 0440) Resp:  [16-19] 19 (12/01 0759) BP: (134-157)/(69-94) 142/87 (12/01 0759) SpO2:  [97 %-100 %] 100 % (  12/01 0759) Weight:  [92.7 kg] 92.7 kg (12/01 0442)  Physical Exam: Physical Exam Constitutional:      General: Kayla Bennett is not in acute distress.    Appearance: Kayla Bennett is well-developed. Kayla Bennett is obese. Kayla Bennett is not diaphoretic.  HENT:     Head: Normocephalic and atraumatic.     Right Ear: External ear normal.     Left Ear: External ear normal.     Mouth/Throat:     Pharynx: No oropharyngeal exudate.  Eyes:     General: No scleral icterus.    Conjunctiva/sclera: Conjunctivae normal.     Pupils: Pupils are equal, round, and reactive to light.  Cardiovascular:     Rate and Rhythm: Normal rate and regular rhythm.     Heart sounds: Normal heart sounds.  Pulmonary:     Effort: Pulmonary effort is normal. No respiratory distress.     Breath sounds: No wheezing.  Abdominal:     Palpations: Abdomen is soft.     Tenderness: There is no rebound.  Musculoskeletal:        General: No tenderness. Normal range of motion.  Lymphadenopathy:     Cervical: No cervical adenopathy.  Skin:    General: Skin is warm and dry.      Coloration: Skin is not pale.     Findings: No erythema or rash.  Neurological:     General: No focal deficit present.     Mental Status: Kayla Bennett is alert and oriented to person, place, and time.     Motor: No abnormal muscle tone.  Psychiatric:        Mood and Affect: Mood is not anxious or depressed.        Speech: Speech normal.        Behavior: Behavior normal.        Thought Content: Thought content normal.        Judgment: Judgment normal.    Right axillary area still exquisitely tender CBC:    BMET Recent Labs    10/16/21 0250 10/17/21 0211  NA 136 138  K 3.6 3.9  CL 103 102  CO2 25 23  GLUCOSE 123* 120*  BUN 9 10  CREATININE 0.70 0.88  CALCIUM 8.4* 8.7*      Liver Panel  No results for input(s): PROT, ALBUMIN, AST, ALT, ALKPHOS, BILITOT, BILIDIR, IBILI in the last 72 hours.     Sedimentation Rate No results for input(s): ESRSEDRATE in the last 72 hours.  C-Reactive Protein No results for input(s): CRP in the last 72 hours.   Micro Results: Recent Results (from the past 720 hour(s))  Resp Panel by RT-PCR (Flu A&B, Covid) Nasopharyngeal Swab     Status: None   Collection Time: 10/12/21  2:13 PM   Specimen: Nasopharyngeal Swab; Nasopharyngeal(NP) swabs in vial transport medium  Result Value Ref Range Status   SARS Coronavirus 2 by RT PCR NEGATIVE NEGATIVE Final    Comment: (NOTE) SARS-CoV-2 target nucleic acids are NOT DETECTED.  The SARS-CoV-2 RNA is generally detectable in upper respiratory specimens during the acute phase of infection. The lowest concentration of SARS-CoV-2 viral copies this assay can detect is 138 copies/mL. A negative result does not preclude SARS-Cov-2 infection and should not be used as the sole basis for treatment or other patient management decisions. A negative result may occur with  improper specimen collection/handling, submission of specimen other than nasopharyngeal swab, presence of viral mutation(s) within  the areas targeted by this assay, and inadequate number of  viral copies(<138 copies/mL). A negative result must be combined with clinical observations, patient history, and epidemiological information. The expected result is Negative.  Fact Sheet for Patients:  EntrepreneurPulse.com.au  Fact Sheet for Healthcare Providers:  IncredibleEmployment.be  This test is no t yet approved or cleared by the [place] FDA and  has been authorized for detection and/or diagnosis of SARS-CoV-2 by FDA under an Emergency Use Authorization (EUA). This EUA will remain  in effect (meaning this test can be used) for the duration of the COVID-19 declaration under Section 564(b)(1) of the Act, 21 U.S.C.section 360bbb-3(b)(1), unless the authorization is terminated  or revoked sooner.       Influenza A by PCR NEGATIVE NEGATIVE Final   Influenza B by PCR NEGATIVE NEGATIVE Final    Comment: (NOTE) The Xpert Xpress SARS-CoV-2/FLU/RSV plus assay is intended as an aid in the diagnosis of influenza from Nasopharyngeal swab specimens and should not be used as a sole basis for treatment. Nasal washings and aspirates are unacceptable for Xpert Xpress SARS-CoV-2/FLU/RSV testing.  Fact Sheet for Patients: EntrepreneurPulse.com.au  Fact Sheet for Healthcare Providers: IncredibleEmployment.be  This test is not yet approved or cleared by the [place] FDA and has been authorized for detection and/or diagnosis of SARS-CoV-2 by FDA under an Emergency Use Authorization (EUA). This EUA will remain in effect (meaning this test can be used) for the duration of the COVID-19 declaration under Section 564(b)(1) of the Act, 21 U.S.C. section 360bbb-3(b)(1), unless the authorization is terminated or revoked.  Performed at [facility], [place] [address], [place], [place] [address]   Blood culture (routine x 2)     Status: None    Collection Time: 10/12/21  4:39 PM   Specimen: BLOOD LEFT HAND  Result Value Ref Range Status   Specimen Description BLOOD LEFT HAND  Final   Special Requests   Final    BOTTLES DRAWN AEROBIC AND ANAEROBIC Blood Culture results may not be optimal due to an inadequate volume of blood received in culture bottles   Culture   Final    NO GROWTH 5 DAYS Performed at [facility], [place] [address] [place], [place] [address]    Report Status 10/17/2021 FINAL  Final  Blood culture (routine x 2)     Status: None (Preliminary result)   Collection Time: 10/13/21  5:48 AM   Specimen: BLOOD LEFT HAND  Result Value Ref Range Status   Specimen Description BLOOD LEFT HAND  Final   Special Requests AEROBIC BOTTLE ONLY Blood Culture adequate volume  Final   Culture   Final    NO GROWTH 4 DAYS Performed at [facility], [place] [address] [place], [place] [address]    Report Status PENDING  Incomplete  MRSA Next Gen by PCR, Nasal     Status: None   Collection Time: 10/14/21  9:24 AM   Specimen: Nasal Mucosa; Nasal Swab  Result Value Ref Range Status   MRSA by PCR Next Gen NOT DETECTED NOT DETECTED Final    Comment: (NOTE) The GeneXpert MRSA Assay (FDA approved for NASAL specimens only), is one component of a comprehensive MRSA colonization surveillance program. It is not intended to diagnose MRSA infection nor to guide or monitor treatment for MRSA infections. Test performance is not FDA approved in patients less than 68 years old. Performed at [facility], [place] [address] [place], [place] [address]   Aerobic/Anaerobic Culture w Gram Stain (surgical/deep wound)  Status: None (Preliminary result)   Collection Time: 10/14/21 11:57 AM   Specimen: Wound; Serous Fluid  Result Value Ref Range Status   Specimen Description WOUND RIGHT AXILLA  Final   Special Requests ASPIRATE  Final   Gram Stain   Final    FEW WBC PRESENT, PREDOMINANTLY MONONUCLEAR NO ORGANISMS SEEN    Culture    Final    NO GROWTH 2 DAYS NO ANAEROBES ISOLATED; CULTURE IN PROGRESS FOR 5 DAYS Performed at [facility], [address] [place], [place] [address]    Report Status PENDING  Incomplete    Studies/Results: No results found.    Assessment/Plan:  INTERVAL HISTORY: Cultures unrevealing from aspirate   Principal Problem:   Sepsis ([place]) Active Problems:   Malignant neoplasm of upper-outer quadrant of right breast in female, estrogen receptor positive (HCC)   Persistent atrial fibrillation (HCC)   Iron deficiency anemia   Fever   Nausea   Headache   Generalized weakness   Diabetes ([place])   Hypertension   GERD (gastroesophageal reflux disease)   Breast abscess    Kayla Bennett is a 70 y.o. female with history of breast cancer status postlumpectomy and axillary lymph node dissection radiation who had recent cardioversion for very difficult to treat atrial fibrillation.  Kayla Bennett then developed fevers with a severe headache chills numbness in the tips of the fingers myalgias and was admitted to the hospital with apparent sepsis and encephalopathy.  Kayla Bennett had had some neck stiffness as well.  When I talked to Kayla Bennett and examined Kayla Bennett initially Kayla Bennett had exquisite pain and tenderness at the 2 cystic sites from where Kayla Bennett had had surgery 1 in Kayla Bennett axilla and 1 involving Kayla Bennett breast tissue.  I have suspected that Kayla Bennett source of infection lies in these cysts that have become acutely inflamed and tender.  We obtained an MRI of the brain to ensure there was no evidence of meningitis or encephalitis and there is none.  Kayla Bennett continues to improve.  Cultures from Kayla Bennett aspirate are unrevealing  I have changed Kayla Bennett over to doxycycline and cefdinir.  Kayla Bennett appears stable on this regimen.  I will plan on Kayla Bennett continuing this until Kayla Bennett can be reevaluated by both general surgery myself and by radiology with ultrasound.   Kayla Bennett has an appointment on 10/23/2021 at 845 AM with Dr. Tommy Medal  at  [facility] is located in the [facility] at  [place] in [place].  [address], which is located to the left of the elevators.  Phone: [phone]  Fax: [phone]  [email or web address]  Kayla Bennett should arrive 15 to 30 minutes prior to the appointment  I spent 36 minutes with the patient including  face to face counseling of the patient guarding Kayla Bennett infected cysts, personally reviewing Kayla Bennett films in the last 48 hours Kayla Bennett updated cultures CBC BMP along with  review of medical records in preparation for the visit and during the visit and in coordination of Kayla Bennett care.   I will sign off for now please call further questions.   LOS: 5 days   Alcide Evener 10/17/2021, 9:13 AM

## 2021-10-17 NOTE — Progress Notes (Signed)
Pt taken downstairs to front lobby via wheelchair by volunteer services. Pt has taken all of her belongings. Pt's husbands downstairs awaiting to transport pt home via private vehicle.

## 2021-10-17 NOTE — Telephone Encounter (Signed)
Pt's husband stopped by the office to pick up repeat Itamar study. I will register the new serial number given today. Pt's husband thanked me for the help today. Pt was d/c from the hospital today as well per pt's husband. See notes from yesterday. Pt will proceed with study this week.

## 2021-10-17 NOTE — Therapy (Signed)
[facility] @ [facility] [place], [place], [address] Phone: [phone]   Fax:  [phone]  Physical Therapy Evaluation  Patient Details  Name: Kayla Bennett MRN: [id number] Date of Birth: 01-08-1951 Referring Provider (PT): Dr. [name]   Encounter Date: 10/17/2021    Past Medical History:  Diagnosis Date   Anemia    Arthritis    Asthma    Breast cancer ([facility])    Diabetes ([facility])    Dyspnea    Dysrhythmia    afibb on occasion   Family history of adverse reaction to anesthesia    mother had reaction to versed...during surgery, stopped breathing   Fibromyalgia    GERD (gastroesophageal reflux disease)    Headache    History of hiatal hernia    Hypertension    Migraines    Osteopenia    Psoriasis     Past Surgical History:  Procedure Laterality Date   ABDOMINAL HYSTERECTOMY     APPENDECTOMY     BREAST LUMPECTOMY WITH RADIOACTIVE SEED AND SENTINEL LYMPH NODE BIOPSY Right 02/18/2021   Procedure: RIGHT BREAST LUMPECTOMY WITH RADIOACTIVE SEED AND SENTINEL LYMPH NODE BIOPSY;  Surgeon: [name], MD;  Location: [facility];  Service: General;  Laterality: Right;   broken wrist     CARDIOVERSION N/A 04/04/2021   Procedure: CARDIOVERSION;  Surgeon: Sueanne Margarita, MD;  Location: [facility];  Service: Cardiovascular;  Laterality: N/A;   CARDIOVERSION N/A 08/14/2021   Procedure: CARDIOVERSION;  Surgeon: Geralynn Rile, MD;  Location: [facility];  Service: Cardiovascular;  Laterality: N/A;   CARDIOVERSION N/A 09/19/2021   Procedure: CARDIOVERSION;  Surgeon: Geralynn Rile, MD;  Location: [facility];  Service: Cardiovascular;  Laterality: N/A;   [facility]     right wrist   HYSTERECTOMY ABDOMINAL WITH SALPINGO-OOPHORECTOMY     LYSIS OF ADHESION     SHOULDER SURGERY Left    TUBAL LIGATION      There were no vitals filed for this  visit.    Subjective Assessment - 10/17/21 1409     Subjective I was referred because I had 2 seromas on the R side and the doctor wanted me to get them checked out. It was in the right outer breast and under the right arm. I was sent to the doctor to see if it needed to be aspirated and that doctor decided there was not a need to do it. I was hospitilized for sepsis and there were not sure if it was meningitis. They tried to aspirate one of the seromas in the breast and my pain in my breast went down a lot. So far the culture has not shown anything. It is still extremely sensitive in two places. Dr. Barry Dienes decided that she wanted to wait two more weeks with me taking antibiotics and then they will check me again. I have had three cardioversions recently and I have gotten so short of breath that I could not walk far. I was referred to PT from the hospital due to weakness.    Pertinent History Patient was diagnosed on 11/22/2020 with right invasive ductal carcinoma breast cancer. She underwent a right lumpectomy and sentinel node biopsy (5 negative nodes) on 02/18/2021. It is ER/PR positive and HER2 negative with a Ki67 of 10%. She has many orthopedic problems from a traumatic fall and other accidents, pt has had 3 cardioversions this  year for atrial fib on 04/04/21, 08/14/21 and 09/19/21, has hx of rotator cuff surgery on the R    Patient Stated Goals I want to be safe when I walk, I have stopped doing jobs that I have to walk any distance    Currently in Pain? Yes    Pain Score 2     Pain Location Axilla    Pain Orientation Right    Pain Descriptors / Indicators Discomfort    Pain Type Acute pain    Pain Onset In the past 7 days    Pain Frequency Intermittent    Aggravating Factors  touching it, putting pressure through arm    Pain Relieving Factors resting it and not using shoulder    Effect of Pain on Daily Activities difficult to do some activites due to pain in arm                [facility] PT  Assessment - 10/17/21 0001       Assessment   Medical Diagnosis s/p right lumpectomy and SLNB    Referring Provider (PT) Dr. [name]    Onset Date/Surgical Date 02/18/21    Hand Dominance Right    Prior Therapy baselines      Precautions   Precautions Other (comment)    Precaution Comments recent hospitilization for sepsis      Restrictions   Weight Bearing Restrictions No      Balance Screen   Has the patient fallen in the past 6 months No    Has the patient had a decrease in activity level because of a fear of falling?  Yes    Is the patient reluctant to leave their home because of a fear of falling?  No      Home Ecologist residence    Living Arrangements Spouse/significant other    Available Help at Discharge Family      Prior Function   Level of Independence Independent with basic ADLs    Vocation Part time employment    Vocation Requirements Sign language interpreter    Leisure pt is not exercising      Cognition   Overall Cognitive Status Within Functional Limits for tasks assessed      Observation/Other Assessments   Observations tenderness to touch in R axilla, 1 cord palpable      Functional Tests   Functional tests Sit to Stand      Sit to Stand   Comments 30 sec sit to stand: 8 reps without UE which is poor for her age - 67 is average for her age      Posture/Postural Control   Posture/Postural Control Postural limitations    Postural Limitations Rounded Shoulders;Forward head      AROM   Right Shoulder Flexion 144 Degrees    Right Shoulder ABduction 111 Degrees    Right Shoulder Internal Rotation 34 Degrees    Right Shoulder External Rotation 77 Degrees      Strength   Right Hip Flexion 3+/5    Right Hip ADduction 4/5    Left Hip Flexion 3-/5    Left Hip ADduction 4/5    Right Knee Flexion 3/5    Right Knee Extension 3/5    Left Knee Flexion 4+/5    Left Knee Extension 4/5    Right Ankle Dorsiflexion 4/5     Left Ankle Dorsiflexion 5/5  Objective measurements completed on examination: See above findings.                     PT Long Term Goals - 10/17/21 1635       PT LONG TERM GOAL #1   Title Pt will demonstrate 4/5 bilateral hip flexion strength to decrease fall risk    Baseline R 3+, L 3-    Time 5    Period Weeks    Status New    Target Date 11/21/21      PT LONG TERM GOAL #2   Title Pt will demonstrate 4+/5 R knee flexion strength to decrease risk of falls    Baseline 3/5    Time 5    Period Weeks    Status New    Target Date 11/21/21      PT LONG TERM GOAL #3   Title Pt will demonstrate 4+/5 bilateral  knee extensor strength to decrease risk of falls.    Baseline 3/5 R, 4/5 L    Time 5    Period Weeks    Status New    Target Date 11/21/21      PT LONG TERM GOAL #4   Title Pt will report she feels safe returning to work because she is not afraid of falling    Time 5    Period Weeks    Status New    Target Date 11/21/21      PT LONG TERM GOAL #5   Title Pt will be indepdent in a home exercise program for continued stretching and strengthening    Time 5    Period Weeks    Status New    Target Date 11/21/21                    Plan - 10/17/21 1453     Clinical Impression Statement Pt reports to PT for decreased R shoulder ROM and seromas. She was referred a few weeks ago but since that time was hospitilized for sepsis and just was discharged today from the hospital. They are not sure the origin of the sepsis but it could be from the seroma. She had one drained and is still very tender to the touch. She has also developed a cord in her right axilla. She has another PT referral for strengthening due to recent weakness from numerous cardioversions since May and most recent hospitization. Her R LE are signfincantly weaker than her L and pt is not sure why. She has weakness in bilateral hip flexors as well.  Did not assess glute strength or hip abduction since this would require her to lie down and move around on the mat and she has so much soreness when pressing through her arm. Pt reports she is fearful of falling and has had to cut back on her job becuase she is unable to walk long distances due to fear of falling. Pt would benefit from skilled PT services to decrease any remaining swelling in area of seroma, improve R shoulder ROM, decrease cording, improve balance and bilateral LE strength.    Personal Factors and Comorbidities Comorbidity 3+    Comorbidities recent hospitilization with sepsis, hx of breast cancer, numerous cardioversions/ a-fib    Examination-Activity Limitations Dressing;Sleep;Carry;Reach Overhead;Lift    Examination-Participation Restrictions Cleaning;Shop;Meal Prep;Driving;Occupation;Yard Work    Stability/Clinical Decision Making Evolving/Moderate complexity    Clinical Decision Making Moderate    Rehab Potential Good    PT Frequency 2x /  week    PT Duration --   5 weeks - on hold for the 1st week   PT Treatment/Interventions ADLs/Self Care Home Management;Therapeutic exercise;Patient/family education;Therapeutic activities;Neuromuscular re-education;Balance training;Gait training;Stair training;Manual techniques;Manual lymph drainage;Passive range of motion    PT Next Visit Plan **Do SOZO again** begin gentle AAROM to R shoulder, MLD to seromas if pt has decreased pain and has completed antibiotics, MFR to cord in R axilla, assess hip extension and abduction strength, assess balance, beging LE strengthening and high level balance exercises    PT Home Exercise Plan educated pt to do exercises given to her by hospital    Consulted and Agree with Plan of Care Patient             Patient will benefit from skilled therapeutic intervention in order to improve the following deficits and impairments:  Postural dysfunction, Decreased range of motion, Decreased knowledge of  precautions, Impaired UE functional use, Pain, Increased fascial restricitons, Decreased strength, Decreased activity tolerance, Increased edema, Difficulty walking, Decreased balance, Decreased endurance  Visit Diagnosis: Muscle weakness (generalized)  Difficulty in walking, not elsewhere classified  Stiffness of right shoulder, not elsewhere classified  Localized edema  Abnormal posture  Malignant neoplasm of upper-outer quadrant of right breast in female, estrogen receptor positive ([facility])     Problem List Patient Active Problem List   Diagnosis Date Noted   Breast abscess    Sepsis ([facility]) 10/12/2021   Fever 10/12/2021   Nausea 10/12/2021   Headache 10/12/2021   Generalized weakness 10/12/2021   Diabetes ([facility])    Hypertension    GERD (gastroesophageal reflux disease)    Iron deficiency anemia 09/25/2021   Secondary hypercoagulable state ([facility]) 07/26/2021   Persistent atrial fibrillation ([facility])    Genetic testing 12/26/2020   Malignant neoplasm of upper-outer quadrant of right breast in female, estrogen receptor positive ([facility]) 11/28/2020    Kayla Bennett, PT 10/17/2021, 4:41 PM  [facility] @ [facility] [place], [place], [address] Phone: [phone]   Fax:  [phone]  Name: Kayla Bennett MRN: [id number] Date of Birth: 1951-01-03

## 2021-10-18 LAB — CULTURE, BLOOD (ROUTINE X 2)
Culture: NO GROWTH
Special Requests: ADEQUATE

## 2021-10-19 LAB — AEROBIC/ANAEROBIC CULTURE W GRAM STAIN (SURGICAL/DEEP WOUND): Culture: NO GROWTH

## 2021-10-22 ENCOUNTER — Other Ambulatory Visit: Payer: Self-pay | Admitting: General Surgery

## 2021-10-22 DIAGNOSIS — Z170 Estrogen receptor positive status [ER+]: Secondary | ICD-10-CM

## 2021-10-22 DIAGNOSIS — Z9889 Other specified postprocedural states: Secondary | ICD-10-CM

## 2021-10-22 DIAGNOSIS — N611 Abscess of the breast and nipple: Secondary | ICD-10-CM

## 2021-10-22 DIAGNOSIS — C50411 Malignant neoplasm of upper-outer quadrant of right female breast: Secondary | ICD-10-CM

## 2021-10-23 ENCOUNTER — Encounter: Payer: Self-pay | Admitting: Infectious Disease

## 2021-10-23 ENCOUNTER — Ambulatory Visit: Payer: Medicare Other | Admitting: Physical Therapy

## 2021-10-23 ENCOUNTER — Other Ambulatory Visit: Payer: Self-pay

## 2021-10-23 ENCOUNTER — Ambulatory Visit (INDEPENDENT_AMBULATORY_CARE_PROVIDER_SITE_OTHER): Payer: Medicare Other | Admitting: Infectious Disease

## 2021-10-23 VITALS — BP 151/82 | HR 85 | Temp 97.9°F | Resp 16 | Ht 62.0 in | Wt 197.0 lb

## 2021-10-23 DIAGNOSIS — I10 Essential (primary) hypertension: Secondary | ICD-10-CM | POA: Diagnosis not present

## 2021-10-23 DIAGNOSIS — N6001 Solitary cyst of right breast: Secondary | ICD-10-CM

## 2021-10-23 DIAGNOSIS — C50411 Malignant neoplasm of upper-outer quadrant of right female breast: Secondary | ICD-10-CM

## 2021-10-23 DIAGNOSIS — N611 Abscess of the breast and nipple: Secondary | ICD-10-CM | POA: Diagnosis not present

## 2021-10-23 DIAGNOSIS — D6869 Other thrombophilia: Secondary | ICD-10-CM | POA: Diagnosis not present

## 2021-10-23 DIAGNOSIS — Z170 Estrogen receptor positive status [ER+]: Secondary | ICD-10-CM | POA: Diagnosis not present

## 2021-10-23 DIAGNOSIS — I4819 Other persistent atrial fibrillation: Secondary | ICD-10-CM

## 2021-10-23 MED ORDER — DOXYCYCLINE HYCLATE 100 MG PO TABS: 100.0000 mg | ORAL_TABLET | Freq: Two times a day (BID) | ORAL | 1 refills | Status: AC

## 2021-10-23 MED ORDER — CEFDINIR 300 MG PO CAPS: 300.0000 mg | ORAL_CAPSULE | Freq: Two times a day (BID) | ORAL | 1 refills | Status: AC

## 2021-10-23 NOTE — Progress Notes (Signed)
Subjective:  Chief complaint still with headaches and still some residual pain at the cysts that she has in her breast and axilla   Patient ID: Kayla Bennett, female    DOB: [birth year], 70 y.o.   MRN: [id number]  HPI   Kayla Bennett is a 70 y.o. female with history of breast cancer status postlumpectomy and axillary lymph node dissection radiation who had recent cardioversion for very difficult to treat atrial fibrillation.  She then developed fevers with a severe headache chills numbness in the tips of the fingers myalgias and was admitted to the hospital with apparent sepsis and encephalopathy.  She had had some neck stiffness as well.  When I talked to her and examined her initially she had exquisite pain and tenderness at the 2 cystic sites from where she had had surgery 1 in her axilla and 1 involving her breast tissue.  I have suspected that her source of infection lies in these cysts that have become acutely inflamed and tender.  We obtained an MRI of the brain to ensure there was no evidence of meningitis or encephalitis and there is none.  Cultures from the aspirate have not yielded an organism and are finalized.  I changed her over to cefdinir and doxycycline as an inpatient as she is continued on these antibiotics she still has headaches though they are more back to where she was before she was hospitalized.  She has less pain in her breast tissue and axillary area where she has these cysts.  However on exam she is still quite tender to palpation particularly in the axilla has an appointment next week with surgery and has an appointment for a mammogram and ultrasound tomorrow.   Past Surgical History:  Procedure Laterality Date   ABDOMINAL HYSTERECTOMY     APPENDECTOMY     BREAST LUMPECTOMY WITH RADIOACTIVE SEED AND SENTINEL LYMPH NODE BIOPSY Right 02/18/2021   Procedure: RIGHT BREAST LUMPECTOMY WITH RADIOACTIVE SEED AND SENTINEL LYMPH NODE BIOPSY;  Surgeon: [name], MD;  Location: [facility];  Service: General;  Laterality: Right;   broken wrist     CARDIOVERSION N/A 04/04/2021   Procedure: CARDIOVERSION;  Surgeon: Sueanne Margarita, MD;  Location: [facility];  Service: Cardiovascular;  Laterality: N/A;   CARDIOVERSION N/A 08/14/2021   Procedure: CARDIOVERSION;  Surgeon: Geralynn Rile, MD;  Location: [facility];  Service: Cardiovascular;  Laterality: N/A;   CARDIOVERSION N/A 09/19/2021   Procedure: CARDIOVERSION;  Surgeon: Geralynn Rile, MD;  Location: [facility];  Service: Cardiovascular;  Laterality: N/A;   [place] [facility]     right wrist   HYSTERECTOMY ABDOMINAL WITH SALPINGO-OOPHORECTOMY     LYSIS OF ADHESION     SHOULDER SURGERY Left    TUBAL LIGATION      Family History  Problem Relation Age of Onset   Atrial fibrillation Mother    Heart attack Mother 43   Colon cancer Father 22   Heart attack Father 59   Heart attack Paternal Grandmother 2   Cancer Maternal Uncle        melanoma    Colon cancer Paternal Uncle    Colon cancer Cousin    Atrial fibrillation Cousin       Social History   Socioeconomic History   Marital status: Married    Spouse name: Not on file   Number of children: 2   Years of education: Not  on file   Highest education level: Not on file  Occupational History   Not on file  Tobacco Use   Smoking status: Never   Smokeless tobacco: Never  Vaping Use   Vaping Use: Never used  Substance and Sexual Activity   Alcohol use: Never   Drug use: Never   Sexual activity: Not on file  Other Topics Concern   Not on file  Social History Narrative   Not on file   Social Determinants of Health   Financial Resource Strain: Not on file  Food Insecurity: Not on file  Transportation Needs: Not on file  Physical Activity: Not on file  Stress: Not on file  Social Connections: Not on file    Allergies  Allergen Reactions   Lotensin  [Benazepril Hcl] Swelling   Morphine Itching and Rash   Erythromycin Base Other (See Comments)    Unknown reaction   Hydrocodone Other (See Comments)    Unknown reaction     Other Swelling    Mustard - vaginal swelling/pain    Banana Swelling    vaginal swelling/pain   Chocolate Swelling    vaginal swelling/pain   Gluten Meal Diarrhea    Upset stomach    Tizanidine Other (See Comments)    Lowered blood pressure    Wound Dressing Adhesive Other (See Comments)    Skin burns, blisters      Current Outpatient Medications:    acetaminophen (TYLENOL) 500 MG tablet, Take 500 mg by mouth every 6 (six) hours as needed for moderate pain, headache or mild pain., Disp: , Rfl:    albuterol (VENTOLIN HFA) 108 (90 Base) MCG/ACT inhaler, Inhale 1-2 puffs into the lungs every 6 (six) hours as needed for shortness of breath or wheezing (Asthma)., Disp: , Rfl:    apixaban (ELIQUIS) 5 MG TABS tablet, Take 1 tablet (5 mg total) by mouth 2 (two) times daily. Pt needs 90 day supply.  Please keep on file till next refill., Disp: 180 tablet, Rfl: 3   calcipotriene (DOVONOX) 0.005 % cream, Apply 1 application topically See admin instructions. Mix with fluorouracil 5% cream and apply topically twice daily for five days - as needed for precancerous on right cheek., Disp: , Rfl:    cefdinir (OMNICEF) 300 MG capsule, Take 1 capsule (300 mg total) by mouth 2 (two) times daily for 10 days., Disp: 21 capsule, Rfl: 0   cetirizine (ZYRTEC) 10 MG tablet, Take 10 mg by mouth in the morning., Disp: , Rfl:    cholecalciferol (VITAMIN D3) 25 MCG (1000 UNIT) tablet, Take 1,000-2,000 Units by mouth See admin instructions. Take 1000 units in the morning and 2000 units at bedtime, Disp: , Rfl:    doxycycline (VIBRA-TABS) 100 MG tablet, Take 1 tablet (100 mg total) by mouth 2 (two) times daily for 10 days., Disp: 20 tablet, Rfl: 0   ferrous sulfate 325 (65 FE) MG tablet, Take 325 mg by mouth every morning., Disp: , Rfl:     fluorouracil (EFUDEX) 5 % cream, Apply 1 application topically 2 (two) times daily as needed (precancerous spots on right cheek). Mix with calcipotriene 0.005% cream and apply topically twice daily for five days - as needed for precancerous on right cheek., Disp: , Rfl:    gabapentin (NEURONTIN) 100 MG capsule, Take 200 mg by mouth at bedtime., Disp: , Rfl:    losartan-hydrochlorothiazide (HYZAAR) 100-25 MG tablet, Take 1 tablet by mouth daily., Disp: 30 tablet, Rfl: 3   metFORMIN (GLUCOPHAGE-XR) 500 MG  24 hr tablet, Take 500 mg by mouth daily with supper., Disp: , Rfl:    metoprolol succinate (TOPROL XL) 25 MG 24 hr tablet, Take 1 tablet (25 mg total) by mouth daily., Disp: , Rfl:    pantoprazole (PROTONIX) 40 MG tablet, Take 40 mg by mouth 2 (two) times daily before a meal., Disp: , Rfl:    potassium chloride SA (KLOR-CON M20) 20 MEQ tablet, Take 1 tablet (20 mEq total) by mouth daily., Disp: 30 tablet, Rfl: 6   Prenatal Vit-Fe Fumarate-FA (PRENATAL VITAMIN PLUS LOW IRON PO), Take 1 tablet by mouth in the morning., Disp: , Rfl:    SUMAtriptan (IMITREX) 100 MG tablet, Take 100 mg by mouth See admin instructions. Take one tablet (100 mg) by mouth at onset of migraine headache, may repeat in 2 hours if headache persists or recurs., Disp: , Rfl:    tamoxifen (NOLVADEX) 20 MG tablet, TAKE 1 TABLET(20 MG) BY MOUTH DAILY (Patient taking differently: Take 20 mg by mouth every morning.), Disp: 30 tablet, Rfl: 3   triamcinolone (KENALOG) 0.1 %, Apply 1 application topically 2 (two) times daily as needed (psoriasis). Mixed with Cetaphil 1:1, Disp: , Rfl:    venlafaxine XR (EFFEXOR-XR) 37.5 MG 24 hr capsule, Take 37.5 mg by mouth at bedtime. Take 1 capsule (75 mg) + 1 capsule (37.5 mg) to equal 112.5 mg at bedtime, Disp: , Rfl:    venlafaxine XR (EFFEXOR-XR) 75 MG 24 hr capsule, Take 75 mg by mouth at bedtime. Take 1 capsule (75 mg) + 1 capsule (37.5 mg) to equal 112.5 mg  at bedtime, Disp: , Rfl:    vitamin B-12  (CYANOCOBALAMIN) 1000 MCG tablet, Take 1,000 mcg by mouth every morning., Disp: , Rfl:    vitamin C (ASCORBIC ACID) 500 MG tablet, Take 500 mg by mouth daily., Disp: , Rfl:    Review of Systems  Constitutional:  Negative for activity change, appetite change, chills, diaphoresis, fatigue, fever and unexpected weight change.  HENT:  Negative for congestion, rhinorrhea, sinus pressure, sneezing, sore throat and trouble swallowing.   Eyes:  Negative for photophobia and visual disturbance.  Respiratory:  Negative for cough, chest tightness, shortness of breath, wheezing and stridor.   Cardiovascular:  Negative for chest pain, palpitations and leg swelling.  Gastrointestinal:  Negative for abdominal distention, abdominal pain, anal bleeding, blood in stool, constipation, diarrhea, nausea and vomiting.  Genitourinary:  Negative for difficulty urinating, dysuria, flank pain and hematuria.  Musculoskeletal:  Negative for arthralgias, back pain, gait problem, joint swelling and myalgias.  Skin:  Negative for color change, pallor, rash and wound.  Neurological:  Negative for dizziness, tremors, weakness and light-headedness.  Hematological:  Negative for adenopathy. Does not bruise/bleed easily.  Psychiatric/Behavioral:  Negative for agitation, behavioral problems, confusion, decreased concentration, dysphoric mood and sleep disturbance.       Objective:   Physical Exam Constitutional:      General: She is not in acute distress.    Appearance: Normal appearance. She is well-developed. She is not ill-appearing or diaphoretic.  HENT:     Head: Normocephalic and atraumatic.     Right Ear: Hearing and external ear normal.     Left Ear: Hearing and external ear normal.     Nose: No nasal deformity or rhinorrhea.  Eyes:     General: No scleral icterus.    Conjunctiva/sclera: Conjunctivae normal.     Right eye: Right conjunctiva is not injected.     Left eye: Left conjunctiva is  not injected.      Pupils: Pupils are equal, round, and reactive to light.  Neck:     Vascular: No JVD.  Cardiovascular:     Rate and Rhythm: Normal rate. Rhythm irregular.     Heart sounds: S1 normal and S2 normal.  Abdominal:     General: Bowel sounds are normal. There is no distension.     Palpations: Abdomen is soft.     Tenderness: There is no abdominal tenderness.  Musculoskeletal:        General: Normal range of motion.     Right shoulder: Normal.     Left shoulder: Normal.     Cervical back: Normal range of motion and neck supple.     Right hip: Normal.     Left hip: Normal.     Right knee: Normal.     Left knee: Normal.  Lymphadenopathy:     Head:     Right side of head: No submandibular, preauricular or posterior auricular adenopathy.     Left side of head: No submandibular, preauricular or posterior auricular adenopathy.     Cervical: No cervical adenopathy.     Right cervical: No superficial or deep cervical adenopathy.    Left cervical: No superficial or deep cervical adenopathy.  Skin:    General: Skin is warm and dry.     Coloration: Skin is not pale.     Findings: No abrasion, bruising, ecchymosis, erythema, lesion or rash.     Nails: There is no clubbing.  Neurological:     General: No focal deficit present.     Mental Status: She is alert and oriented to person, place, and time.     Sensory: No sensory deficit.     Coordination: Coordination normal.     Gait: Gait normal.  Psychiatric:        Attention and Perception: She is attentive.        Mood and Affect: Mood normal.        Speech: Speech normal.        Behavior: Behavior normal. Behavior is cooperative.        Thought Content: Thought content normal.        Judgment: Judgment normal.     Axilla and breast tissue: there are 2 very firm but case of axilla exquisitely tender cystic areas present.  10/23/2021:        Assessment & Plan:   Infected cysts:   These were the only sites on exam and in her history  that made sense to be a source of her infection.  Cultures on antibiotics were unrevealing.  I still think the most prudent thing to do would be to have these areas drained.  In the interim I will continue her on cefdinir and doxycycline which have sent in as prescriptions.  She has a mammogram tomorrow as well as an ultrasound.  She has an appoint with general surgery on Monday and I have rescheduled her to see me as well next Wednesday.  PAF: on anticoagulation.   HTN: Blood pressure up today in clinic follow-up with PCP and continue antihypertensive medications  Today's Vitals   10/23/21 0844  BP: (!) 151/82  Pulse: 85  Resp: 16  Temp: 97.9 F (36.6 C)  TempSrc: Oral  SpO2: 92%  Weight: 197 lb (89.4 kg)  Height: 5\' 2"  (1.575 m)  PainSc: 0-No pain   Body mass index is 36.03 kg/m.   Vaccine counseling: She cannot receive her updated  booster today as she has a mammogram tomorrow.

## 2021-10-25 ENCOUNTER — Inpatient Hospital Stay: Payer: Medicare Other

## 2021-10-25 ENCOUNTER — Inpatient Hospital Stay: Payer: Medicare Other | Attending: Radiation Oncology | Admitting: Physician Assistant

## 2021-10-25 ENCOUNTER — Encounter: Payer: Self-pay | Admitting: Physician Assistant

## 2021-10-25 ENCOUNTER — Other Ambulatory Visit: Payer: Self-pay

## 2021-10-25 VITALS — BP 128/77 | HR 85 | Temp 97.9°F | Resp 17 | Ht 62.0 in | Wt 196.9 lb

## 2021-10-25 DIAGNOSIS — C50411 Malignant neoplasm of upper-outer quadrant of right female breast: Secondary | ICD-10-CM

## 2021-10-25 DIAGNOSIS — M797 Fibromyalgia: Secondary | ICD-10-CM | POA: Diagnosis not present

## 2021-10-25 DIAGNOSIS — K449 Diaphragmatic hernia without obstruction or gangrene: Secondary | ICD-10-CM | POA: Diagnosis not present

## 2021-10-25 DIAGNOSIS — M25512 Pain in left shoulder: Secondary | ICD-10-CM | POA: Diagnosis not present

## 2021-10-25 DIAGNOSIS — M25562 Pain in left knee: Secondary | ICD-10-CM | POA: Insufficient documentation

## 2021-10-25 DIAGNOSIS — I10 Essential (primary) hypertension: Secondary | ICD-10-CM | POA: Diagnosis not present

## 2021-10-25 DIAGNOSIS — K219 Gastro-esophageal reflux disease without esophagitis: Secondary | ICD-10-CM | POA: Insufficient documentation

## 2021-10-25 DIAGNOSIS — M25559 Pain in unspecified hip: Secondary | ICD-10-CM | POA: Insufficient documentation

## 2021-10-25 DIAGNOSIS — D509 Iron deficiency anemia, unspecified: Secondary | ICD-10-CM | POA: Diagnosis not present

## 2021-10-25 DIAGNOSIS — E119 Type 2 diabetes mellitus without complications: Secondary | ICD-10-CM | POA: Insufficient documentation

## 2021-10-25 DIAGNOSIS — Z800 Family history of malignant neoplasm of digestive organs: Secondary | ICD-10-CM | POA: Insufficient documentation

## 2021-10-25 DIAGNOSIS — Z170 Estrogen receptor positive status [ER+]: Secondary | ICD-10-CM

## 2021-10-25 DIAGNOSIS — G8929 Other chronic pain: Secondary | ICD-10-CM | POA: Diagnosis not present

## 2021-10-25 DIAGNOSIS — Z79810 Long term (current) use of selective estrogen receptor modulators (SERMs): Secondary | ICD-10-CM | POA: Insufficient documentation

## 2021-10-25 DIAGNOSIS — R232 Flushing: Secondary | ICD-10-CM | POA: Diagnosis not present

## 2021-10-25 DIAGNOSIS — R519 Headache, unspecified: Secondary | ICD-10-CM | POA: Diagnosis not present

## 2021-10-25 DIAGNOSIS — Z7901 Long term (current) use of anticoagulants: Secondary | ICD-10-CM | POA: Insufficient documentation

## 2021-10-25 DIAGNOSIS — E1140 Type 2 diabetes mellitus with diabetic neuropathy, unspecified: Secondary | ICD-10-CM | POA: Insufficient documentation

## 2021-10-25 DIAGNOSIS — D649 Anemia, unspecified: Secondary | ICD-10-CM

## 2021-10-25 DIAGNOSIS — M25511 Pain in right shoulder: Secondary | ICD-10-CM | POA: Insufficient documentation

## 2021-10-25 DIAGNOSIS — Z7984 Long term (current) use of oral hypoglycemic drugs: Secondary | ICD-10-CM | POA: Insufficient documentation

## 2021-10-25 DIAGNOSIS — N6489 Other specified disorders of breast: Secondary | ICD-10-CM | POA: Diagnosis not present

## 2021-10-25 DIAGNOSIS — Z79899 Other long term (current) drug therapy: Secondary | ICD-10-CM | POA: Insufficient documentation

## 2021-10-25 DIAGNOSIS — Z9221 Personal history of antineoplastic chemotherapy: Secondary | ICD-10-CM | POA: Diagnosis not present

## 2021-10-25 DIAGNOSIS — E785 Hyperlipidemia, unspecified: Secondary | ICD-10-CM | POA: Diagnosis not present

## 2021-10-25 DIAGNOSIS — M8580 Other specified disorders of bone density and structure, unspecified site: Secondary | ICD-10-CM | POA: Insufficient documentation

## 2021-10-25 DIAGNOSIS — Z923 Personal history of irradiation: Secondary | ICD-10-CM | POA: Diagnosis not present

## 2021-10-25 DIAGNOSIS — E538 Deficiency of other specified B group vitamins: Secondary | ICD-10-CM

## 2021-10-25 DIAGNOSIS — Z808 Family history of malignant neoplasm of other organs or systems: Secondary | ICD-10-CM | POA: Insufficient documentation

## 2021-10-25 LAB — CBC WITH DIFFERENTIAL (CANCER CENTER ONLY)
Abs Immature Granulocytes: 0.01 10*3/uL (ref 0.00–0.07)
Basophils Absolute: 0 10*3/uL (ref 0.0–0.1)
Basophils Relative: 1 %
Eosinophils Absolute: 0.1 10*3/uL (ref 0.0–0.5)
Eosinophils Relative: 1 %
HCT: 38.2 % (ref 36.0–46.0)
Hemoglobin: 12 g/dL (ref 12.0–15.0)
Immature Granulocytes: 0 %
Lymphocytes Relative: 26 %
Lymphs Abs: 1.7 10*3/uL (ref 0.7–4.0)
MCH: 28.8 pg (ref 26.0–34.0)
MCHC: 31.4 g/dL (ref 30.0–36.0)
MCV: 91.8 fL (ref 80.0–100.0)
Monocytes Absolute: 0.5 10*3/uL (ref 0.1–1.0)
Monocytes Relative: 8 %
Neutro Abs: 4.3 10*3/uL (ref 1.7–7.7)
Neutrophils Relative %: 64 %
Platelet Count: 272 10*3/uL (ref 150–400)
RBC: 4.16 MIL/uL (ref 3.87–5.11)
RDW: 17.7 % — ABNORMAL HIGH (ref 11.5–15.5)
WBC Count: 6.6 10*3/uL (ref 4.0–10.5)
nRBC: 0 % (ref 0.0–0.2)

## 2021-10-25 LAB — CMP (CANCER CENTER ONLY)
ALT: 21 U/L (ref 0–44)
AST: 36 U/L (ref 15–41)
Albumin: 3.4 g/dL — ABNORMAL LOW (ref 3.5–5.0)
Alkaline Phosphatase: 67 U/L (ref 38–126)
Anion gap: 10 (ref 5–15)
BUN: 22 mg/dL (ref 8–23)
CO2: 27 mmol/L (ref 22–32)
Calcium: 8.9 mg/dL (ref 8.9–10.3)
Chloride: 104 mmol/L (ref 98–111)
Creatinine: 0.96 mg/dL (ref 0.44–1.00)
GFR, Estimated: >60 mL/min (ref >60)
Glucose, Bld: 107 mg/dL — ABNORMAL HIGH (ref 70–99)
Potassium: 3.9 mmol/L (ref 3.5–5.1)
Sodium: 141 mmol/L (ref 135–145)
Total Bilirubin: 0.3 mg/dL (ref 0.3–1.2)
Total Protein: 7.3 g/dL (ref 6.5–8.1)

## 2021-10-25 LAB — IRON AND TIBC
Iron: 82 ug/dL (ref 41–142)
Saturation Ratios: 21 % (ref 21–57)
TIBC: 386 ug/dL (ref 236–444)
UIBC: 304 ug/dL (ref 120–384)

## 2021-10-25 LAB — VITAMIN B12: Vitamin B-12: 344 pg/mL (ref 180–914)

## 2021-10-25 LAB — FERRITIN: Ferritin: 578 ng/mL — ABNORMAL HIGH (ref 11–307)

## 2021-10-25 NOTE — Progress Notes (Addendum)
[name] OFFICE PROGRESS NOTE  [name], [name] [address]  DIAGNOSIS: Stage IA left breast cancer  Oncology History Overview Note  Cancer Staging Malignant neoplasm of upper-outer quadrant of right breast in female, estrogen receptor positive ([place]) Staging form: Breast, AJCC 8th Edition - Clinical stage from 11/22/2020: Stage IA (cT1b, cN0, cM0, G2, ER+, PR+, HER2-) - Signed by Truitt Merle, MD on 12/04/2020 Stage prefix: Initial diagnosis - Pathologic: Stage IA (pT1c, pN0, cM0, G2, ER+, PR+, HER2-) - Signed by Gardenia Phlegm, NP on 02/27/2021 Histologic grading system: 3 grade system    Malignant neoplasm of upper-outer quadrant of right breast in female, estrogen receptor positive ([place])  11/05/2020 Breast US   US Breast 11/05/20  IMPRESSION No Sonographic correlate is seen for the round microlobulated 36mm mass in the upper outer right breast 10cm from the nipple, 9: 00 position.  Given this mass is new on mammography and has suspicious borders, a stereotactic guided biopsy is recommended.     11/22/2020 Cancer Staging   Staging form: Breast, AJCC 8th Edition - Clinical stage from 11/22/2020: Stage IA (cT1b, cN0, cM0, G2, ER+, PR+, HER2-) - Signed by Truitt Merle, MD on 12/04/2020    11/22/2020 Initial Biopsy    Diagnosis 11/22/20  Breast, right, needle core biopsy, 10 cmfn, upper outer quadrant, post depth - INVASIVE DUCTAL CARCINOMA. SEE NOTE Diagnosis Note Carcinoma measures 0.7 cm in greatest linear dimension and appears grade 2. Dr. Tresa Moore reviewed the case and concurs with the diagnosis. A breast prognostic profile (ER, PR, Ki-67 and HER2) is pending and will be reported in an addendum. Dr. Luan Pulling was notified on 11/23/2020.   11/22/2020 Receptors her2    ROGNOSTIC INDICATORS Results: IMMUNOHISTOCHEMICAL AND MORPHOMETRIC ANALYSIS PERFORMED MANUALLY The tumor cells are NEGATIVE for Her2 (1+). Estrogen Receptor: 95%,  POSITIVE, STRONG STAINING INTENSITY Progesterone Receptor: 95%, POSITIVE, STRONG STAINING INTENSITY Proliferation Marker Ki67: 10%   11/28/2020 Initial Diagnosis   Malignant neoplasm of upper-outer quadrant of right breast in female, estrogen receptor positive ([place])   12/22/2020 Genetic Testing   Negative hereditary cancer genetic testing: no pathogenic variants detected in [facility] Common Hereditary Cancers Panel + Melanoma Genes.  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  The report date is December 22, 2020.    The Common Hereditary Cancers Panel+Melanoma Genes offered by [facility] includes sequencing and/or deletion duplication testing of the following 52 genes: APC, ATM, AXIN2, BAP1, BARD1, BMPR1A, BRCA1, BRCA2, BRIP1, CDH1, CDK4, CDKN2A (p14ARF), CDKN2A (p16INK4a), CHEK2, CTNNA1, DICER1, EPCAM (Deletion/duplication testing only), GREM1 (promoter region deletion/duplication testing only), HOXB13, KIT, MEN1, MITF, MLH1, MSH2, MSH3, MSH6, MUTYH, NBN, NF1, NHTL1, PALB2, PDGFRA, PMS2, POLD1, POLE, PTEN, POT1, RAD50, RAD51C, RAD51D, RB1, RNF43, SDHA, SDHB, SDHC, SDHD, SMAD4, SMARCA4, STK11, TP53, TSC1, TSC2, and VHL.  The following genes were evaluated for sequence changes only: SDHA and HOXB13 c.251G>A variant only.   02/18/2021 Surgery   FINAL MICROSCOPIC DIAGNOSIS:   A. BREAST, RIGHT, LUMPECTOMY:  - Invasive ductal carcinoma, 1.2 cm, grade 2  - Ductal carcinoma in situ, intermediate grade  - Resection margins are negative for carcinoma; posterior margin is less than 1 mm from carcinoma  - Biopsy site changes  - See oncology table   B. LYMPH NODE, RIGHT AXILLARY #1, SENTINEL, EXCISION:  - Lymph node, negative for carcinoma (0/1)   C. LYMPH NODE, RIGHT AXILLARY #2, SENTINEL, EXCISION:  - Lymph node, negative for carcinoma (0/1)  D. LYMPH NODE, RIGHT AXILLARY, SENTINEL, EXCISION:  - Lymph node, negative for carcinoma (0/1)   E. LYMPH NODE, RIGHT AXILLARY  #3, SENTINEL, EXCISION:  - Lymph node, negative for carcinoma (0/1)   F. LYMPH NODE, RIGHT AXILLARY, SENTINEL, EXCISION:  - Lymph node, negative for carcinoma (0/1)     02/18/2021 Oncotype testing   Oncotype DX was obtained on the final surgical sample and the recurrence score of 0 predicts a risk of recurrence outside the breast over the next 9 years of 3%, if the patient's only systemic therapy is an antiestrogen for 5 years.  It also predicts no benefit from chemotherapy.   02/27/2021 Cancer Staging   Staging form: Breast, AJCC 8th Edition - Pathologic: Stage IA (pT1c, pN0, cM0, G2, ER+, PR+, HER2-) - Signed by Gardenia Phlegm, NP on 02/27/2021 Histologic grading system: 3 grade system    03/2021 - 04/2021 Radiation Therapy   Completed adjuvant Radiation by Dr. Isidore Moos   05/2021 -  Neo-Adjuvant Anti-estrogen oral therapy   Began tamoxifen mid July 2022   07/29/2021 Survivorship   SCP delivered by Cira Rue, NP     CURRENT THERAPY: Tamoxifen p.o. daily started in July 2022.   INTERVAL HISTORY: Kayla Bennett 70 y.o. female returns to clinic today for follow-up visit.  The patient was last seen in the clinic by Dr. Burr Medico on 04/25/21.  She then saw Bella Kennedy for a survivorship care plan visit on 07/29/21.  The patient was found to have B12 deficiency and iron deficiency at that time.  The patient has been taking B12 supplements p.o. daily as well as iron supplements which she is compliant with.  She received 1 dose of Feraheme while admitted to the hospital recently.  The patient states she is up-to-date on her colonoscopy which was last performed by Dr. Collene Mares.  The patient states that she is expected to have a repeat colonoscopy in 2025.  She has a history of polyps.  She denies any abnormal bleeding or bruising.  The patient had a hysterectomy, therefore, denies any unusual vaginal bleeding.  She sometimes has dark stools but she attributes this to her iron supplement.  In the interval  since her last appointment, the patient presented to the emergency room on 10/12/2021 and was admitted until 10/17/2021 for the chief complaint of fever, generalized fatigue, and weakness.  The patient was treated for possible sepsis with an unclear source of infection.  She also was endorsing some right axillary discomfort.  She had a CT of the chest which showed a right axillary fluid collection.  She also had an MRI of the brain due to headaches which was negative for any acute intracranial abnormality.  Today, she mentions that her headaches have returned to her "normal headaches".  The patient is prescribed Imitrex for headaches. She was seen by interventional radiology for aspiration of the postop breast fluid collection and the cultures remained negative to date.  She is being followed closely by infectious disease as an outpatient.  She has a follow-up appointment next week.  She is also scheduled for a ultrasound of this region next week and a follow-up with a surgeon shortly thereafter for consideration of drainage.  The patient is currently taking antibiotics and she is scheduled to complete this tomorrow.  She denies any more fevers since being discharged from the hospital.  She continues to have some right axillary discomfort.    Of note, the patient also states she is scheduled to go to  the lymphedema clinic.  At her last appointment with Dr. Burr Medico, Dr. Ernestina Penna note mentions that the patient had not heard from genetic counseling which she was referred to previously.  I discussed this with the patient. She has a lot going on right now with her recent hospitalization and outpatient follow-ups that she would like to hold off on seeing genetic counseling at this time.   Regarding her breast cancer, no other findings of disease progression were noted while admitted to the hospital.  The patient is currently taking tamoxifen she is tolerating this fairly well except for some hot flashes.  The patient denies  any unusual bone pain.  The patient's been having some ongoing issues with intermittent nausea but not enough to warrant any antiemetics.  The patient is not performing her routine self breast exams but she is compliant with her follow-up imaging studies which is going to be [facility] at [facility] in February 2023.  She also recently had her DEXA scan which showed osteopenia per patient report.  The patient is taking a vitamin D supplement but not a calcium supplement.  She is not interested in taking a calcium supplement at this time.  She is here today for evaluation and repeat blood work.     MEDICAL HISTORY: Past Medical History:  Diagnosis Date   Anemia    Arthritis    Asthma    Breast cancer ([place])    Diabetes ([place])    Dyspnea    Dysrhythmia    afibb on occasion   Family history of adverse reaction to anesthesia    mother had reaction to versed...during surgery, stopped breathing   Fibromyalgia    GERD (gastroesophageal reflux disease)    Headache    History of hiatal hernia    Hypertension    Migraines    Osteopenia    Psoriasis     ALLERGIES:  is allergic to lotensin [benazepril hcl], morphine, erythromycin base, hydrocodone, other, banana, chocolate, gluten meal, tizanidine, and wound dressing adhesive.  MEDICATIONS:  Current Outpatient Medications  Medication Sig Dispense Refill   acetaminophen (TYLENOL) 500 MG tablet Take 500 mg by mouth every 6 (six) hours as needed for moderate pain, headache or mild pain.     albuterol (VENTOLIN HFA) 108 (90 Base) MCG/ACT inhaler Inhale 1-2 puffs into the lungs every 6 (six) hours as needed for shortness of breath or wheezing (Asthma).     apixaban (ELIQUIS) 5 MG TABS tablet Take 1 tablet (5 mg total) by mouth 2 (two) times daily. Pt needs 90 day supply.  Please keep on file till next refill. 180 tablet 3   calcipotriene (DOVONOX) 0.005 % cream Apply 1 application topically See admin instructions. Mix with fluorouracil 5% cream and apply  topically twice daily for five days - as needed for precancerous on right cheek.     cefdinir (OMNICEF) 300 MG capsule Take 1 capsule (300 mg total) by mouth 2 (two) times daily for 10 days. 21 capsule 1   cetirizine (ZYRTEC) 10 MG tablet Take 10 mg by mouth in the morning.     cholecalciferol (VITAMIN D3) 25 MCG (1000 UNIT) tablet Take 1,000-2,000 Units by mouth See admin instructions. Take 1000 units in the morning and 2000 units at bedtime     doxycycline (VIBRA-TABS) 100 MG tablet Take 1 tablet (100 mg total) by mouth 2 (two) times daily for 10 days. 20 tablet 1   ferrous sulfate 325 (65 FE) MG tablet Take 325 mg by mouth every  morning.     fluorouracil (EFUDEX) 5 % cream Apply 1 application topically 2 (two) times daily as needed (precancerous spots on right cheek). Mix with calcipotriene 0.005% cream and apply topically twice daily for five days - as needed for precancerous on right cheek.     gabapentin (NEURONTIN) 100 MG capsule Take 200 mg by mouth at bedtime.     losartan-hydrochlorothiazide (HYZAAR) 100-25 MG tablet Take 1 tablet by mouth daily. 30 tablet 3   metFORMIN (GLUCOPHAGE-XR) 500 MG 24 hr tablet Take 500 mg by mouth daily with supper.     metoprolol succinate (TOPROL XL) 25 MG 24 hr tablet Take 1 tablet (25 mg total) by mouth daily.     pantoprazole (PROTONIX) 40 MG tablet Take 40 mg by mouth 2 (two) times daily before a meal.     potassium chloride SA (KLOR-CON M20) 20 MEQ tablet Take 1 tablet (20 mEq total) by mouth daily. 30 tablet 6   Prenatal Vit-Fe Fumarate-FA (PRENATAL VITAMIN PLUS LOW IRON PO) Take 1 tablet by mouth in the morning.     SUMAtriptan (IMITREX) 100 MG tablet Take 100 mg by mouth See admin instructions. Take one tablet (100 mg) by mouth at onset of migraine headache, may repeat in 2 hours if headache persists or recurs.     tamoxifen (NOLVADEX) 20 MG tablet TAKE 1 TABLET(20 MG) BY MOUTH DAILY (Patient taking differently: Take 20 mg by mouth every morning.) 30  tablet 3   triamcinolone (KENALOG) 0.1 % Apply 1 application topically 2 (two) times daily as needed (psoriasis). Mixed with Cetaphil 1:1     venlafaxine XR (EFFEXOR-XR) 37.5 MG 24 hr capsule Take 37.5 mg by mouth at bedtime. Take 1 capsule (75 mg) + 1 capsule (37.5 mg) to equal 112.5 mg at bedtime     venlafaxine XR (EFFEXOR-XR) 75 MG 24 hr capsule Take 75 mg by mouth at bedtime. Take 1 capsule (75 mg) + 1 capsule (37.5 mg) to equal 112.5 mg  at bedtime     vitamin B-12 (CYANOCOBALAMIN) 1000 MCG tablet Take 1,000 mcg by mouth every morning.     vitamin C (ASCORBIC ACID) 500 MG tablet Take 500 mg by mouth daily.     No current facility-administered medications for this visit.    SURGICAL HISTORY:  Past Surgical History:  Procedure Laterality Date   ABDOMINAL HYSTERECTOMY     APPENDECTOMY     BREAST LUMPECTOMY WITH RADIOACTIVE SEED AND SENTINEL LYMPH NODE BIOPSY Right 02/18/2021   Procedure: RIGHT BREAST LUMPECTOMY WITH RADIOACTIVE SEED AND SENTINEL LYMPH NODE BIOPSY;  Surgeon: [name], MD;  Location: [facility];  Service: General;  Laterality: Right;   broken wrist     CARDIOVERSION N/A 04/04/2021   Procedure: CARDIOVERSION;  Surgeon: Sueanne Margarita, MD;  Location: [facility];  Service: Cardiovascular;  Laterality: N/A;   CARDIOVERSION N/A 08/14/2021   Procedure: CARDIOVERSION;  Surgeon: Geralynn Rile, MD;  Location: [facility];  Service: Cardiovascular;  Laterality: N/A;   CARDIOVERSION N/A 09/19/2021   Procedure: CARDIOVERSION;  Surgeon: Geralynn Rile, MD;  Location: [facility];  Service: Cardiovascular;  Laterality: N/A;   [place]     right wrist   HYSTERECTOMY ABDOMINAL WITH SALPINGO-OOPHORECTOMY     LYSIS OF ADHESION     SHOULDER SURGERY Left    TUBAL LIGATION      REVIEW OF SYSTEMS:   Review of Systems  Constitutional:  Negative for appetite change, chills, fatigue, fever and unexpected weight  change.  HENT: Negative for mouth sores, nosebleeds, sore throat and trouble swallowing.   Eyes: Negative for eye problems and icterus.  Respiratory: Negative for cough, hemoptysis, shortness of breath and wheezing.   Cardiovascular: Negative for chest pain and leg swelling.  Gastrointestinal: Positive for occasional nausea.  Negative for abdominal pain, constipation, diarrhea, and vomiting.  Genitourinary: Negative for bladder incontinence, difficulty urinating, dysuria, frequency and hematuria.   Musculoskeletal: Right axillary tenderness.  Negative for back pain, gait problem, neck pain and neck stiffness.  Skin: Positive for some tenderness in the right axillary region.  Negative for itching and rash.  Neurological: Negative for dizziness, extremity weakness, gait problem, headaches, light-headedness and seizures.  Hematological: Negative for adenopathy. Does not bruise/bleed easily.  Psychiatric/Behavioral: Negative for confusion, depression and sleep disturbance. The patient is not nervous/anxious.     PHYSICAL EXAMINATION:  Blood pressure 128/77, pulse 85, temperature 97.9 F (36.6 C), temperature source Temporal, resp. rate 17, height $RemoveBe'5\' 2"'bPduWlqxs$  (1.575 m), weight 196 lb 14.4 oz (89.3 kg), SpO2 97 %.  ECOG PERFORMANCE STATUS: 1  Physical Exam  Constitutional: Oriented to person, place, and time and well-developed, well-nourished, and in no distress. HENT:  Head: Normocephalic and atraumatic.  Mouth/Throat: Oropharynx is clear and moist. No oropharyngeal exudate.  Eyes: Conjunctivae are normal. Right eye exhibits no discharge. Left eye exhibits no discharge. No scleral icterus.  Neck: Normal range of motion. Neck supple.  Cardiovascular: Normal rate, regular rhythm, normal heart sounds and intact distal pulses.   Pulmonary/Chest: Effort normal and breath sounds normal. No respiratory distress. No wheezes. No rales.  Abdominal: Soft. Bowel sounds are normal. Exhibits no distension and no  mass. There is no tenderness.  Musculoskeletal: Normal range of motion. Exhibits no edema.  Lymphadenopathy:    No cervical adenopathy.  Neurological: Alert and oriented to person, place, and time. Exhibits normal muscle tone. Gait normal. Coordination normal.  Skin: Skin is warm and dry. No rash noted. Not diaphoretic. No erythema. No pallor.  Tenderness in the right axillary region Breasts: Breast inspection showed them to be symmetrical with no nipple discharge. Palpation of the breasts and axilla revealed no obvious mass that I could appreciate.  She has tenderness in the right axillary region at the site of her fluid collection.  No significant erythema. Psychiatric: Mood, memory and judgment normal.  Vitals reviewed.  LABORATORY DATA: Lab Results  Component Value Date   WBC 6.6 10/25/2021   HGB 12.0 10/25/2021   HCT 38.2 10/25/2021   MCV 91.8 10/25/2021   PLT 272 10/25/2021      Chemistry      Component Value Date/Time   NA 141 10/25/2021 1217   NA 141 07/10/2021 1102   K 3.9 10/25/2021 1217   CL 104 10/25/2021 1217   CO2 27 10/25/2021 1217   BUN 22 10/25/2021 1217   BUN 17 07/10/2021 1102   CREATININE 0.96 10/25/2021 1217      Component Value Date/Time   CALCIUM 8.9 10/25/2021 1217   ALKPHOS 67 10/25/2021 1217   AST 36 10/25/2021 1217   ALT 21 10/25/2021 1217   BILITOT 0.3 10/25/2021 1217       RADIOGRAPHIC STUDIES:  DG Chest 2 View  Result Date: 10/12/2021 CLINICAL DATA:  Chest pain, shortness of breath dizziness EXAM: CHEST - 2 VIEW COMPARISON:  02/04/2008 FINDINGS: Cardiac size is within normal limits. There are no signs of pulmonary edema or focal pulmonary  consolidation. There are small linear densities in the left lower lung fields suggesting subsegmental atelectasis or scarring. There is no pleural effusion or pneumothorax. There is possible 3 mm calcific density in the left mid lung fields. Surgical clips are seen in the right axilla. IMPRESSION: There  are no signs of pulmonary edema or focal pulmonary consolidation. Small transverse linear densities in the left lower lung fields suggest scarring or subsegmental atelectasis. Electronically Signed   By: Elmer Picker M.D.   On: 10/12/2021 14:53   MR BRAIN W WO CONTRAST  Result Date: 10/13/2021 CLINICAL DATA:  Headache, new or worsening EXAM: MRI HEAD WITHOUT AND WITH CONTRAST TECHNIQUE: Multiplanar, multiecho pulse sequences of the brain and surrounding structures were obtained without and with intravenous contrast. CONTRAST:  69mL GADAVIST GADOBUTROL 1 MMOL/ML IV SOLN COMPARISON:  None. FINDINGS: Brain: No restricted diffusion to suggest acute or subacute infarct. No acute hemorrhage, hydrocephalus, extra-axial collection, or mass lesion. The ventricles and sulci are within normal limits for age. No abnormal enhancement. Vascular: Normal flow voids. Skull and upper cervical spine: Normal marrow signal. Sinuses/Orbits: Mucosal thickening in the right maxillary sinus. The orbits are unremarkable. Other: Trace fluid in left mastoid air cells. IMPRESSION: No acute intracranial process. No etiology is seen for the patient's headache. Electronically Signed   By: Merilyn Baba M.D.   On: 10/13/2021 20:46   CT CHEST ABDOMEN PELVIS W CONTRAST  Result Date: 10/13/2021 CLINICAL DATA:  Chronic iron deficiency anemia, paroxysmal atrial fibrillation, type II diabetes mellitus, sepsis of unknown source, past history breast cancer, hypertension, GERD EXAM: CT CHEST, ABDOMEN, AND PELVIS WITH CONTRAST TECHNIQUE: Multidetector CT imaging of the chest, abdomen and pelvis was performed following the standard protocol during bolus administration of intravenous contrast. CONTRAST:  159mL OMNIPAQUE IOHEXOL 300 MG/ML  SOLN COMPARISON:  CT abdomen and pelvis 08/14/2005 FINDINGS: CT CHEST FINDINGS Cardiovascular: Atherosclerotic calcifications aorta and coronary arteries. Aorta normal caliber. Pulmonary vascular structures  grossly patent on non targeted exam. Heart unremarkable. No pericardial effusion. Mediastinum/Nodes: Esophagus normal appearance. Base of cervical region normal appearance. No thoracic adenopathy. Surgical clips RIGHT RIGHT axilla. Focal fluid collection RIGHT axilla 4.9 x 3.8 cm, may represent a postoperative collection such as hematoma, seroma or lymphocele, less likely necrotic lymph node. A second fluid collection is seen in the posterior RIGHT breast 2.9 x 2.7 cm. Lungs/Pleura: Tiny calcified granulomata at lateral RIGHT lung base. Lungs otherwise clear. No pulmonary infiltrate, pleural effusion, or pneumothorax. Musculoskeletal: Diffuse osseous demineralization. CT ABDOMEN PELVIS FINDINGS Hepatobiliary: Gallbladder surgically absent. Liver normal appearance Pancreas: Normal appearance Spleen: Normal appearance. Small probable splenule anterior to spleen. Adrenals/Urinary Tract: Adrenal glands normal appearance. Kidneys, ureters, and bladder normal appearance Stomach/Bowel: Appendix not visualized, no pericecal inflammatory process seen. Small hiatal hernia. Stomach and bowel loops otherwise normal appearance. Vascular/Lymphatic: Atherosclerotic calcifications aorta and iliac arteries without aneurysm. Vascular structures patent. No adenopathy. Reproductive: Uterus surgically absent with nonvisualization of ovaries Other: No free air or free fluid. No hernia or inflammatory process. Musculoskeletal: Mild osseous demineralization. IMPRESSION: No acute intrathoracic, intra-abdominal or intrapelvic abnormalities. Small hiatal hernia. Fluid collections in the RIGHT axilla and posterior RIGHT breast question postoperative such as seroma, hematoma, or lymphocele; infection not excluded by CT. Aortic Atherosclerosis (ICD10-I70.0). Electronically Signed   By: Lavonia Dana M.D.   On: 10/13/2021 12:38   [place] IMAGE GUIDED DRAINAGE BY PERCUTANEOUS CATHETER  Result Date: 10/14/2021 INDICATION: Infection, unknown etiology.  RIGHT breast and axillary collections on CT. EXAM: US-GUIDED DRAINAGE OF RIGHT AXILLARY  FLUID COLLECTION COMPARISON:  CT CAP, 10/13/2021. MEDICATIONS: The patient is currently admitted to the hospital and receiving intravenous antibiotics. The antibiotics were administered within an appropriate time frame prior to the initiation of the procedure. ANESTHESIA/SEDATION: Local anesthetic was administered. CONTRAST:  None COMPLICATIONS: None immediate. PROCEDURE: Informed written consent was obtained from the patient after a discussion of the risks, benefits and alternatives to treatment. Preprocedural ultrasound scanning demonstrated well-circumscribed, mildly complex RIGHT axillary fluid collection. A timeout was performed prior to the initiation of the procedure. The RIGHT chest and axilla was prepped and draped in the usual sterile fashion. The overlying soft tissues were anesthetized with 1% lidocaine with epinephrine. Under direct ultrasound guidance, a 18 gauge 10 cm Yueh needle was advanced into the abscess/fluid collection. Multiple ultrasound images were saved for procedural documentation purposes. Next, approximately 5 mL serous fluid was aspirated from the collection. A representative sample of aspirated fluid was capped and sent to the laboratory for analysis. The needle was removed and superficial hemostasis was achieved with manual compression. A dressing was placed. The patient tolerated the procedure well without immediate postprocedural complication. IMPRESSION: Successful US-guided aspiration of RIGHT axillary fluid collection. A representative aspirated sample was sent to the laboratory as requested by the ordering clinical team. Michaelle Birks, MD [facility] Radiology Electronically Signed   By: Michaelle Birks M.D.   On: 10/14/2021 15:40     ASSESSMENT/PLAN:  Kayla Bennett is a 70 y.o. female with    1. Malignant neoplasm of upper-outer quadrant  of right breast, Stage IA, (pT1c, pN0), ER+/PR+/HER2-, Grade II  -Discovered on screening mammogram. Found to have a 9 mm right breast mass. Biopsy 11/22/20 confirmed invasive ductal carcinoma. -Right lumpectomy on 02/18/21 under Dr. Barry Dienes showed grade 2 invasive ductal carcinoma, 1.2 cm, with DCIS. 5 benign lymph nodes. -Oncotype DX score of 0. -She completed adjuvant radiation therapy under Dr. Isidore Moos on 05/07/21. -Given the strong ER and PR expression, and medical comorbilities, Dr. Burr Medico recommend adjuvant endocrine therapy with Tamoxifen for a total of 5-10 years to reduce the risk of cancer recurrence. Potential benefits and side effects were discussed with patient .  -She started this in July 2022. She is tolerating well except some hot flashes which is manageable.  -We also discussed the breast cancer surveillance after her surgery. She will continue annual screening mammogram, self exam, and a routine office visit with lab and exam with [place]. -She is due for mammogram in February 2023. She states this is scheduled at [facility] and follow up with [place] in 4 months    2. Comorbidities: Chronic joint pain, DM, HTN, fibromyalgia, GERD, HLD, osteopenia, Neuropathy -her joint pain is in b/l shoulders, left knee and hip with very limited ROM of left shoulder. -On medications (metformin, losartan/HCTZ, injections for HLD, Gabapentin, Protonix. -Continue to F/u with PCP Dr Dyane Dustman -She has had osteoporosis in her 19s and now osteopenic on 10/2017 DEXA. I discussed Tamoxifen can help strengthen her bones.  -Most recent DEXA scan 08/21/2021.  The patient is up-to-date. -She is taking a vitamin D supplement but not calcium.  I encouraged her to consider also taking a calcium supplement.  She will think about this.   3. Genetics -She has extensive paternal family history of colon cancer and melanoma. She is eligible for genetic testing.  -She was previously referred. -Due to the patient's recent  hospitalization, she has a lot of upcoming appointments and she would like to  hold off on arranging for genetic counseling appointment at this time.  She knows how to reach [place] and will call us when she is interested to move forward with this.   4. Anemia -Patient has a history of anemia due to B12 and iron deficiency. -The patient is up-to-date on her colonoscopy and is reportedly due for a repeat colonoscopy in 2025. -Patient denies any abnormal bleeding or bruising.  She is status post hysterectomy. -Received IV iron with Feraheme in recent hospitalization on 10/14/2021 -CBC today shows a normal hemoglobin at 12.0.  The patient's iron study do not show any evidence of iron deficiency.  Ferritin is pending at this time.  No IV iron required at this time.  Encourage the patient to continue with her iron supplement and B12 supplement.  We will recheck her labs in 2 months to follow-up on her anemia and again in 4 months for her follow-up visit.  5. Right axillary fluid collection  -Following with ID. Scheduled for follow up next week.  -Scheduled for [place] next week and f/u with general surgery a few days later -She completes her anti-biotic tomorrow   PLAN:  -Begin tamoxifen in 05/2021.  Tolerating well.  She will call us when she requires a refill -Mammogram in February 2023 -Lab in 2 months to follow-up on anemia with CBC, iron studies, B12, CMP, and ferritin. -Per Dr. Burr Medico, office follow-up visit in 4 months. -Continue B12 and iron supplement.  No need for IV iron at this time.   Orders Placed This Encounter  Procedures   CBC with Differential ([facility] Only)    Standing Status:   Standing    Number of Occurrences:   2    Standing Expiration Date:   10/25/2022   Ferritin    Standing Status:   Standing    Number of Occurrences:   2    Standing Expiration Date:   10/25/2022   Iron and TIBC    Standing Status:   Standing    Number of Occurrences:   2    Standing Expiration Date:    10/25/2022   Vitamin B12    Standing Status:   Standing    Number of Occurrences:   2    Standing Expiration Date:   10/25/2022     The total time spent in the appointment was 20-29 minutes.   Paris Chiriboga L Xzayvier Fagin, PA-C 10/25/21

## 2021-10-28 DIAGNOSIS — Z170 Estrogen receptor positive status [ER+]: Secondary | ICD-10-CM | POA: Diagnosis not present

## 2021-10-28 DIAGNOSIS — Z853 Personal history of malignant neoplasm of breast: Secondary | ICD-10-CM | POA: Diagnosis not present

## 2021-10-28 DIAGNOSIS — C50411 Malignant neoplasm of upper-outer quadrant of right female breast: Secondary | ICD-10-CM | POA: Diagnosis not present

## 2021-10-30 ENCOUNTER — Ambulatory Visit (INDEPENDENT_AMBULATORY_CARE_PROVIDER_SITE_OTHER): Payer: Medicare Other | Admitting: Infectious Disease

## 2021-10-30 ENCOUNTER — Other Ambulatory Visit: Payer: Self-pay

## 2021-10-30 ENCOUNTER — Encounter: Payer: Self-pay | Admitting: Infectious Disease

## 2021-10-30 VITALS — BP 119/80 | HR 80 | Temp 97.4°F | Wt 196.6 lb

## 2021-10-30 DIAGNOSIS — B379 Candidiasis, unspecified: Secondary | ICD-10-CM

## 2021-10-30 DIAGNOSIS — N611 Abscess of the breast and nipple: Secondary | ICD-10-CM | POA: Diagnosis not present

## 2021-10-30 DIAGNOSIS — I4819 Other persistent atrial fibrillation: Secondary | ICD-10-CM | POA: Diagnosis not present

## 2021-10-30 DIAGNOSIS — C50411 Malignant neoplasm of upper-outer quadrant of right female breast: Secondary | ICD-10-CM | POA: Diagnosis not present

## 2021-10-30 DIAGNOSIS — Z170 Estrogen receptor positive status [ER+]: Secondary | ICD-10-CM | POA: Diagnosis not present

## 2021-10-30 HISTORY — DX: Candidiasis, unspecified: B37.9

## 2021-10-30 MED ORDER — FLUCONAZOLE 100 MG PO TABS: 100.0000 mg | ORAL_TABLET | Freq: Every day | ORAL | 0 refills | Status: DC

## 2021-10-30 NOTE — Progress Notes (Signed)
Subjective:  Chief complaint complaining of vaginal yeast infection   Patient ID: ELISABET GUTZMER, female    DOB: Jan 10, 1951, 70 y.o.   MRN: [id number]  HPI   LEISEL PINETTE is a 70 y.o. female with history of breast cancer status postlumpectomy and axillary lymph node dissection radiation who had recent cardioversion for very difficult to treat atrial fibrillation.  She then developed fevers with a severe headache chills numbness in the tips of the fingers myalgias and was admitted to the hospital with apparent sepsis and encephalopathy.  She had had some neck stiffness as well.  When I talked to her and examined her initially she had exquisite pain and tenderness at the 2 cystic sites from where she had had surgery 1 in her axilla and 1 involving her breast tissue.  I have suspected that her source of infection lies in these cysts that have become acutely inflamed and tender.  We obtained an MRI of the brain to ensure there was no evidence of meningitis or encephalitis and there is none.  Cultures from the aspirate have not yielded an organism and are finalized.  I changed her over to cefdinir and doxycycline as an inpatient as she is continued on these antibiotics she still has headaches though they are more back to where she was before she was hospitalized.  She had less pain in her breast tissue and axillary area where she has these cysts I saw her in clinic on the fifth.  She was still quite tender on exam particular in the axilla.  She is seen Dr. Barry Dienes with general surgery who did not think that she had much evidence for ongoing infection on exam.  The patient is scheduled for aspiration of the cysts by radiology on December 23.  My understanding is Delorse Lek will be obtained.  Dr. Barry Dienes had recommended the patient stop antibiotics 2 days prior to the aspiration.   From the patient has developed a vaginal yeast infection.  Past Surgical History:  Procedure Laterality  Date   ABDOMINAL HYSTERECTOMY     APPENDECTOMY     BREAST LUMPECTOMY WITH RADIOACTIVE SEED AND SENTINEL LYMPH NODE BIOPSY Right 02/18/2021   Procedure: RIGHT BREAST LUMPECTOMY WITH RADIOACTIVE SEED AND SENTINEL LYMPH NODE BIOPSY;  Surgeon: [name], MD;  Location: [facility];  Service: General;  Laterality: Right;   broken wrist     CARDIOVERSION N/A 04/04/2021   Procedure: CARDIOVERSION;  Surgeon: Sueanne Margarita, MD;  Location: [facility];  Service: Cardiovascular;  Laterality: N/A;   CARDIOVERSION N/A 08/14/2021   Procedure: CARDIOVERSION;  Surgeon: Geralynn Rile, MD;  Location: [facility];  Service: Cardiovascular;  Laterality: N/A;   CARDIOVERSION N/A 09/19/2021   Procedure: CARDIOVERSION;  Surgeon: Geralynn Rile, MD;  Location: [facility];  Service: Cardiovascular;  Laterality: N/A;   [place]     right wrist   HYSTERECTOMY ABDOMINAL WITH SALPINGO-OOPHORECTOMY     LYSIS OF ADHESION     SHOULDER SURGERY Left    TUBAL LIGATION      Family History  Problem Relation Age of Onset   Atrial fibrillation Mother    Heart attack Mother 42   Colon cancer Father 52   Heart attack Father 51   Heart attack Paternal Grandmother 22   Cancer Maternal Uncle        melanoma    Colon cancer Paternal Uncle    Colon  cancer Cousin    Atrial fibrillation Cousin       Social History   Socioeconomic History   Marital status: Married    Spouse name: Not on file   Number of children: 2   Years of education: Not on file   Highest education level: Not on file  Occupational History   Not on file  Tobacco Use   Smoking status: Never   Smokeless tobacco: Never  Vaping Use   Vaping Use: Never used  Substance and Sexual Activity   Alcohol use: Never   Drug use: Never   Sexual activity: Not on file  Other Topics Concern   Not on file  Social History Narrative   Not on file   Social Determinants of Health    Financial Resource Strain: Not on file  Food Insecurity: Not on file  Transportation Needs: Not on file  Physical Activity: Not on file  Stress: Not on file  Social Connections: Not on file    Allergies  Allergen Reactions   Lotensin [Benazepril Hcl] Swelling   Morphine Itching and Rash   Erythromycin Base Other (See Comments)    Unknown reaction   Hydrocodone Other (See Comments)    Unknown reaction     Other Swelling    Mustard - vaginal swelling/pain    Banana Swelling    vaginal swelling/pain   Chocolate Swelling    vaginal swelling/pain   Gluten Meal Diarrhea    Upset stomach    Tizanidine Other (See Comments)    Lowered blood pressure    Wound Dressing Adhesive Other (See Comments)    Skin burns, blisters      Current Outpatient Medications:    acetaminophen (TYLENOL) 500 MG tablet, Take 500 mg by mouth every 6 (six) hours as needed for moderate pain, headache or mild pain., Disp: , Rfl:    albuterol (VENTOLIN HFA) 108 (90 Base) MCG/ACT inhaler, Inhale 1-2 puffs into the lungs every 6 (six) hours as needed for shortness of breath or wheezing (Asthma)., Disp: , Rfl:    apixaban (ELIQUIS) 5 MG TABS tablet, Take 1 tablet (5 mg total) by mouth 2 (two) times daily. Pt needs 90 day supply.  Please keep on file till next refill., Disp: 180 tablet, Rfl: 3   calcipotriene (DOVONOX) 0.005 % cream, Apply 1 application topically See admin instructions. Mix with fluorouracil 5% cream and apply topically twice daily for five days - as needed for precancerous on right cheek., Disp: , Rfl:    cefdinir (OMNICEF) 300 MG capsule, Take 1 capsule (300 mg total) by mouth 2 (two) times daily for 10 days., Disp: 21 capsule, Rfl: 1   cetirizine (ZYRTEC) 10 MG tablet, Take 10 mg by mouth in the morning., Disp: , Rfl:    cholecalciferol (VITAMIN D3) 25 MCG (1000 UNIT) tablet, Take 1,000-2,000 Units by mouth See admin instructions. Take 1000 units in the morning and 2000 units at bedtime,  Disp: , Rfl:    doxycycline (VIBRA-TABS) 100 MG tablet, Take 1 tablet (100 mg total) by mouth 2 (two) times daily for 10 days., Disp: 20 tablet, Rfl: 1   ferrous sulfate 325 (65 FE) MG tablet, Take 325 mg by mouth every morning., Disp: , Rfl:    fluorouracil (EFUDEX) 5 % cream, Apply 1 application topically 2 (two) times daily as needed (precancerous spots on right cheek). Mix with calcipotriene 0.005% cream and apply topically twice daily for five days - as needed for precancerous on right cheek., Disp: ,  Rfl:    gabapentin (NEURONTIN) 100 MG capsule, Take 200 mg by mouth at bedtime., Disp: , Rfl:    losartan-hydrochlorothiazide (HYZAAR) 100-25 MG tablet, Take 1 tablet by mouth daily., Disp: 30 tablet, Rfl: 3   metFORMIN (GLUCOPHAGE-XR) 500 MG 24 hr tablet, Take 500 mg by mouth daily with supper., Disp: , Rfl:    metoprolol succinate (TOPROL XL) 25 MG 24 hr tablet, Take 1 tablet (25 mg total) by mouth daily., Disp: , Rfl:    pantoprazole (PROTONIX) 40 MG tablet, Take 40 mg by mouth 2 (two) times daily before a meal., Disp: , Rfl:    potassium chloride SA (KLOR-CON M20) 20 MEQ tablet, Take 1 tablet (20 mEq total) by mouth daily., Disp: 30 tablet, Rfl: 6   Prenatal Vit-Fe Fumarate-FA (PRENATAL VITAMIN PLUS LOW IRON PO), Take 1 tablet by mouth in the morning., Disp: , Rfl:    SUMAtriptan (IMITREX) 100 MG tablet, Take 100 mg by mouth See admin instructions. Take one tablet (100 mg) by mouth at onset of migraine headache, may repeat in 2 hours if headache persists or recurs., Disp: , Rfl:    tamoxifen (NOLVADEX) 20 MG tablet, TAKE 1 TABLET(20 MG) BY MOUTH DAILY (Patient taking differently: Take 20 mg by mouth every morning.), Disp: 30 tablet, Rfl: 3   triamcinolone (KENALOG) 0.1 %, Apply 1 application topically 2 (two) times daily as needed (psoriasis). Mixed with Cetaphil 1:1, Disp: , Rfl:    venlafaxine XR (EFFEXOR-XR) 37.5 MG 24 hr capsule, Take 37.5 mg by mouth at bedtime. Take 1 capsule (75 mg) + 1  capsule (37.5 mg) to equal 112.5 mg at bedtime, Disp: , Rfl:    venlafaxine XR (EFFEXOR-XR) 75 MG 24 hr capsule, Take 75 mg by mouth at bedtime. Take 1 capsule (75 mg) + 1 capsule (37.5 mg) to equal 112.5 mg  at bedtime, Disp: , Rfl:    vitamin B-12 (CYANOCOBALAMIN) 1000 MCG tablet, Take 1,000 mcg by mouth every morning., Disp: , Rfl:    vitamin C (ASCORBIC ACID) 500 MG tablet, Take 500 mg by mouth daily., Disp: , Rfl:    Review of Systems  Constitutional:  Negative for activity change, appetite change, chills, diaphoresis, fatigue, fever and unexpected weight change.  HENT:  Negative for congestion, rhinorrhea, sinus pressure, sneezing, sore throat and trouble swallowing.   Eyes:  Negative for photophobia and visual disturbance.  Respiratory:  Negative for cough, chest tightness, shortness of breath, wheezing and stridor.   Cardiovascular:  Negative for chest pain, palpitations and leg swelling.  Gastrointestinal:  Negative for abdominal distention, abdominal pain, anal bleeding, blood in stool, constipation, diarrhea, nausea and vomiting.  Genitourinary:  Positive for vaginal discharge. Negative for difficulty urinating, dysuria, flank pain and hematuria.  Musculoskeletal:  Negative for arthralgias, back pain, gait problem, joint swelling and myalgias.  Skin:  Negative for color change, pallor, rash and wound.  Neurological:  Negative for dizziness, tremors, weakness and light-headedness.  Hematological:  Negative for adenopathy. Does not bruise/bleed easily.  Psychiatric/Behavioral:  Negative for agitation, behavioral problems, confusion, decreased concentration, dysphoric mood and sleep disturbance.       Objective:   Physical Exam Constitutional:      General: She is not in acute distress.    Appearance: Normal appearance. She is well-developed. She is not ill-appearing or diaphoretic.  HENT:     Head: Normocephalic and atraumatic.     Right Ear: Hearing and external ear normal.      Left Ear: Hearing and  external ear normal.     Nose: No nasal deformity or rhinorrhea.  Eyes:     General: No scleral icterus.    Conjunctiva/sclera: Conjunctivae normal.     Right eye: Right conjunctiva is not injected.     Left eye: Left conjunctiva is not injected.     Pupils: Pupils are equal, round, and reactive to light.  Neck:     Vascular: No JVD.  Cardiovascular:     Rate and Rhythm: Normal rate and regular rhythm.     Heart sounds: S1 normal and S2 normal.    No friction rub.  Pulmonary:     Effort: Pulmonary effort is normal. No respiratory distress.     Breath sounds: No wheezing.  Abdominal:     General: There is no distension.     Palpations: Abdomen is soft.  Musculoskeletal:        General: Normal range of motion.     Right shoulder: Normal.     Left shoulder: Normal.     Cervical back: Normal range of motion and neck supple.     Right hip: Normal.     Left hip: Normal.     Right knee: Normal.     Left knee: Normal.  Lymphadenopathy:     Head:     Right side of head: No submandibular, preauricular or posterior auricular adenopathy.     Left side of head: No submandibular, preauricular or posterior auricular adenopathy.     Cervical: No cervical adenopathy.     Right cervical: No superficial or deep cervical adenopathy.    Left cervical: No superficial or deep cervical adenopathy.  Skin:    General: Skin is warm and dry.     Coloration: Skin is not pale.     Findings: No abrasion, bruising, ecchymosis, erythema, lesion or rash.     Nails: There is no clubbing.  Neurological:     General: No focal deficit present.     Mental Status: She is alert and oriented to person, place, and time.     Sensory: No sensory deficit.     Coordination: Coordination normal.     Gait: Gait normal.  Psychiatric:        Attention and Perception: She is attentive.        Mood and Affect: Mood normal.        Speech: Speech normal.        Behavior: Behavior normal. Behavior  is cooperative.        Thought Content: Thought content normal.        Judgment: Judgment normal.    Axilla and breast tissue: Still with some fairly tender areas palpable in 2 discrete locations  10/23/2021:    10/30/2021:       Assessment & Plan:  Infected cyst:  I have reviewed Dr. Yevonne Aline notes from general surgeon and Iagree with Dr. Marlowe Aschoff idea of stopping antibiotics to increase yield on cultures taken from these areas when they were drained.  I think actually since the aspiration is roughly a week from today that she can stop antibiotics today in anticipation of the area being drained.  She is to monitor for any evidence of recurrent significant infection with new or worsening erythema or pain or systemic symptoms suggestive of infection  She experiences these she is to contact me immediately and resume her antibiotics  Vaginal yeast infection: I am prescribing fluconazole for her for 10 days  In the interim I will continue her  on cefdinir and doxycycline which have sent in as prescriptions.  Paroxysmal atrial fibrillation we will continue on anticoagulation   Hypertension: Blood pressure better controlled in clinic with [place] today continue antihypertensive Today's Vitals   10/30/21 0849 10/30/21 0852  BP: 119/80   Pulse: 80   Temp: (!) 97.4 F (36.3 C)   TempSrc: Oral   Weight: 196 lb 9.6 oz (89.2 kg)   PainSc:  3    Body mass index is 35.96 kg/m.

## 2021-10-31 ENCOUNTER — Ambulatory Visit: Payer: Medicare Other | Admitting: Rehabilitation

## 2021-10-31 ENCOUNTER — Encounter: Payer: Self-pay | Admitting: Rehabilitation

## 2021-10-31 DIAGNOSIS — M6281 Muscle weakness (generalized): Secondary | ICD-10-CM

## 2021-10-31 DIAGNOSIS — M25611 Stiffness of right shoulder, not elsewhere classified: Secondary | ICD-10-CM

## 2021-10-31 DIAGNOSIS — C50411 Malignant neoplasm of upper-outer quadrant of right female breast: Secondary | ICD-10-CM

## 2021-10-31 DIAGNOSIS — R262 Difficulty in walking, not elsewhere classified: Secondary | ICD-10-CM | POA: Diagnosis not present

## 2021-10-31 DIAGNOSIS — Z483 Aftercare following surgery for neoplasm: Secondary | ICD-10-CM | POA: Diagnosis not present

## 2021-10-31 DIAGNOSIS — R293 Abnormal posture: Secondary | ICD-10-CM | POA: Diagnosis not present

## 2021-10-31 DIAGNOSIS — R600 Localized edema: Secondary | ICD-10-CM

## 2021-10-31 DIAGNOSIS — Z170 Estrogen receptor positive status [ER+]: Secondary | ICD-10-CM | POA: Diagnosis not present

## 2021-10-31 NOTE — Therapy (Signed)
[facility] @ [address] [place], [place], [address] Phone: [phone]   Fax:  [phone]  Physical Therapy Treatment  Patient Details  Name: Kayla Bennett MRN: [id number] Date of Birth: [birth year] Referring Provider (PT): Dr. [name]   Encounter Date: 10/31/2021   PT End of Session - 10/31/21 0942     Visit Number 2    Number of Visits 9    Date for PT Re-Evaluation 11/21/21    PT Start Time 0900    PT Stop Time 0942    PT Time Calculation (min) 42 min    Activity Tolerance Patient tolerated treatment well    Behavior During Therapy [facility] for tasks assessed/performed             Past Medical History:  Diagnosis Date   Anemia    Arthritis    Asthma    Breast cancer ([place])    Diabetes ([place])    Dyspnea    Dysrhythmia    afibb on occasion   Family history of adverse reaction to anesthesia    mother had reaction to versed...during surgery, stopped breathing   Fibromyalgia    GERD (gastroesophageal reflux disease)    Headache    History of hiatal hernia    Hypertension    Migraines    Osteopenia    Psoriasis    Yeast infection 10/30/2021    Past Surgical History:  Procedure Laterality Date   ABDOMINAL HYSTERECTOMY     APPENDECTOMY     BREAST LUMPECTOMY WITH RADIOACTIVE SEED AND SENTINEL LYMPH NODE BIOPSY Right 02/18/2021   Procedure: RIGHT BREAST LUMPECTOMY WITH RADIOACTIVE SEED AND SENTINEL LYMPH NODE BIOPSY;  Surgeon: [name], MD;  Location: [facility];  Service: General;  Laterality: Right;   broken wrist     CARDIOVERSION N/A 04/04/2021   Procedure: CARDIOVERSION;  Surgeon: Sueanne Margarita, MD;  Location: [facility];  Service: Cardiovascular;  Laterality: N/A;   CARDIOVERSION N/A 08/14/2021   Procedure: CARDIOVERSION;  Surgeon: Geralynn Rile, MD;  Location: [facility];  Service: Cardiovascular;  Laterality: N/A;   CARDIOVERSION N/A 09/19/2021   Procedure: CARDIOVERSION;  Surgeon:  Geralynn Rile, MD;  Location: [facility];  Service: Cardiovascular;  Laterality: N/A;   [place]     right wrist   HYSTERECTOMY ABDOMINAL WITH SALPINGO-OOPHORECTOMY     LYSIS OF ADHESION     SHOULDER SURGERY Left    TUBAL LIGATION      There were no vitals filed for this visit.   Subjective Assessment - 10/31/21 0904     Subjective I am going to have a guided aspiration and meeting with ID 11/07/21.    Pertinent History Patient was diagnosed on 11/22/2020 with right invasive ductal carcinoma breast cancer. She underwent a right lumpectomy and sentinel node biopsy (5 negative nodes) on 02/18/2021. It is ER/PR positive and HER2 negative with a Ki67 of 10%. She has many orthopedic problems from a traumatic fall and other accidents, pt has had 3 cardioversions this year for atrial fib on 04/04/21, 08/14/21 and 09/19/21, has hx of rotator cuff surgery on the R    Currently in Pain? Yes    Pain Score 3    all over fibromyalgia pain               [facility] PT Assessment - 10/31/21 0001       Standardized  Balance Assessment   Standardized Balance Assessment Berg Balance Test      Berg Balance Test   Sit to Stand Able to stand without using hands and stabilize independently    Standing Unsupported Able to stand safely 2 minutes    Sitting with Back Unsupported but Feet Supported on Floor or Stool Able to sit safely and securely 2 minutes    Stand to Sit Sits safely with minimal use of hands    Transfers Able to transfer safely, minor use of hands    Standing Unsupported with Eyes Closed Able to stand 10 seconds with supervision    Standing Unsupported with Feet Together Able to place feet together independently and stand for 1 minute with supervision    From Standing, Reach Forward with Outstretched Arm Can reach confidently >25 cm (10")    From Standing Position, Pick up Object from Floor Able to pick up shoe, needs  supervision    From Standing Position, Turn to Look Behind Over each Shoulder Looks behind from both sides and weight shifts well    Turn 360 Degrees Able to turn 360 degrees safely in 4 seconds or less    Standing Unsupported, Alternately Place Feet on Step/Stool Able to complete >2 steps/needs minimal assist   use of bars   Standing Unsupported, One Foot in Front Able to plae foot ahead of the other independently and hold 30 seconds    Standing on One Leg Able to lift leg independently and hold equal to or more than 3 seconds   Lt: 10sec   Rt: 3 with more wobble   Total Score 47                           [facility] Adult PT Treatment/Exercise - 10/31/21 0001       Exercises   Exercises Knee/Hip;Other Exercises    Other Exercises  seated ball squeeze 3" x 10, LAQ 1# x 5 bil, hip abduction bil yellow band in front of mirror for visual cues and vcs to not lean over to the left.  Reviewed HEP and movement.                          PT Long Term Goals - 10/17/21 1635       PT LONG TERM GOAL #1   Title Pt will demonstrate 4/5 bilateral hip flexion strength to decrease fall risk    Baseline R 3+, L 3-    Time 5    Period Weeks    Status New    Target Date 11/21/21      PT LONG TERM GOAL #2   Title Pt will demonstrate 4+/5 R knee flexion strength to decrease risk of falls    Baseline 3/5    Time 5    Period Weeks    Status New    Target Date 11/21/21      PT LONG TERM GOAL #3   Title Pt will demonstrate 4+/5 bilateral  knee extensor strength to decrease risk of falls.    Baseline 3/5 R, 4/5 L    Time 5    Period Weeks    Status New    Target Date 11/21/21      PT LONG TERM GOAL #4   Title Pt will report she feels safe returning to work because she is not afraid of falling    Time 5  Period Weeks    Status New    Target Date 11/21/21      PT LONG TERM GOAL #5   Title Pt will be indepdent in a home exercise program for continued stretching and  strengthening    Time 5    Period Weeks    Status New    Target Date 11/21/21                   Plan - 10/31/21 0946     Clinical Impression Statement Waited on MLD/breast work as pt was instructed to stop ABX in order to get a culture during guided aspiration on 11/07/21.  Focused on Berg balance assessment and general TE.  Pt demonstrates weakness in the Rt LE even in standing and with attempted single leg balance work using this leg.  Pt also seems to sit and walk with a bit of a left lean which she said her friend has noticed recently.  No known reason.  Did not repeat SOZO as pt is in active Afib and needing cardioversion/ablation soon.    PT Treatment/Interventions ADLs/Self Care Home Management;Therapeutic exercise;Patient/family education;Therapeutic activities;Neuromuscular re-education;Balance training;Gait training;Stair training;Manual techniques;Manual lymph drainage;Passive range of motion    PT Next Visit Plan **Do SOZO again when not in Afib** gentle AAROM to R shoulder, MFR to cord in R axilla after aspiration procedure and visit with ID, cont LE strengthening and high level balance exercises Rt leg is the weakest    Consulted and Agree with Plan of Care Patient             Patient will benefit from skilled therapeutic intervention in order to improve the following deficits and impairments:     Visit Diagnosis: Muscle weakness (generalized)  Difficulty in walking, not elsewhere classified  Stiffness of right shoulder, not elsewhere classified  Localized edema  Abnormal posture  Malignant neoplasm of upper-outer quadrant of right breast in female, estrogen receptor positive (HCC)  Aftercare following surgery for neoplasm     Problem List Patient Active Problem List   Diagnosis Date Noted   Yeast infection 10/30/2021   Breast abscess    Sepsis ([place]) 10/12/2021   Fever 10/12/2021   Nausea 10/12/2021   Headache 10/12/2021   Generalized weakness  10/12/2021   Diabetes ([place])    Hypertension    GERD (gastroesophageal reflux disease)    Iron deficiency anemia 09/25/2021   Secondary hypercoagulable state ([place]) 07/26/2021   Persistent atrial fibrillation ([name])    Genetic testing 12/26/2020   Malignant neoplasm of upper-outer quadrant of right breast in female, estrogen receptor positive ([place]) 11/28/2020    Stark Bray, PT 10/31/2021, 9:51 AM  [facility] @ [place], [place], [address] Phone: [phone]   Fax:  [phone]  Name: Kayla Bennett MRN: [id number] Date of Birth: 1951-06-17

## 2021-11-05 ENCOUNTER — Encounter: Payer: Self-pay | Admitting: Cardiology

## 2021-11-05 ENCOUNTER — Ambulatory Visit (INDEPENDENT_AMBULATORY_CARE_PROVIDER_SITE_OTHER): Payer: Medicare Other | Admitting: Cardiology

## 2021-11-05 ENCOUNTER — Encounter: Payer: Medicare Other | Admitting: Physical Therapy

## 2021-11-05 ENCOUNTER — Other Ambulatory Visit: Payer: Self-pay

## 2021-11-05 VITALS — BP 116/74 | HR 75 | Ht 62.0 in | Wt 197.4 lb

## 2021-11-05 DIAGNOSIS — Z01812 Encounter for preprocedural laboratory examination: Secondary | ICD-10-CM

## 2021-11-05 DIAGNOSIS — Z01818 Encounter for other preprocedural examination: Secondary | ICD-10-CM | POA: Diagnosis not present

## 2021-11-05 DIAGNOSIS — I4819 Other persistent atrial fibrillation: Secondary | ICD-10-CM

## 2021-11-05 NOTE — Patient Instructions (Signed)
Medication Instructions:  Your physician recommends that you continue on your current medications as directed. Please refer to the Current Medication list given to you today.  *If you need a refill on your cardiac medications before your next appointment, please call your pharmacy*   Lab Work: Pre procedure labs 11/25/2021:  BMP & CBC  If you have labs (blood work) drawn today and your tests are completely normal, you will receive your results only by: Kayla Bennett (if you have MyChart) OR A paper copy in the mail If you have any lab test that is abnormal or we need to change your treatment, we will call you to review the results.   Testing/Procedures: Your physician has requested that you have cardiac CT within 7 days PRIOR to your ablation. Cardiac computed tomography (CT) is a painless test that uses an x-ray machine to take clear, detailed pictures of your heart.  Please follow instruction below located under "other instructions". You will get a call from our office to schedule the date for this test.  Your physician has recommended that you have an ablation. Catheter ablation is a medical procedure used to treat some cardiac arrhythmias (irregular heartbeats). During catheter ablation, a long, thin, flexible tube is put into a blood vessel in your groin (upper thigh), or neck. This tube is called an ablation catheter. It is then guided to your heart through the blood vessel. Radio frequency waves destroy small areas of heart tissue where abnormal heartbeats may cause an arrhythmia to start. Please follow instruction below located under "other instructions".   Follow-Up: At [facility], you and your health needs are our priority.  As part of our continuing mission to provide you with exceptional heart care, we have created designated Provider Care Teams.  These Care Teams include your primary Cardiologist (physician) and Advanced Practice Providers (APPs -  Physician Assistants and  Nurse Practitioners) who all work together to provide you with the care you need, when you need it.  Your next appointment:   1 month(s) after your ablation  The format for your next appointment:   In Person  Provider:   AFib clinic   Thank you for choosing [facility]!!   Trinidad Curet, RN [phone]    Other Instructions   CT INSTRUCTIONS Your cardiac CT will be scheduled at:  [facility] [address] [place], [place] [address] [phone]  Please arrive at the [facility] main entrance of [facility] 30 minutes prior to test start time. Proceed to the [facility] Radiology Department (first floor) to check-in and test prep.   Please follow these instructions carefully (unless otherwise directed):  On the Night Before the Test: Be sure to Drink plenty of water. Do not consume any caffeinated/decaffeinated beverages or chocolate 12 hours prior to your test. Do not take any antihistamines 12 hours prior to your test.  On the Day of the Test: Drink plenty of water until 1 hour prior to the test. Do not eat any food 4 hours prior to the test. You may take your regular medications prior to the test.  Take your Metoprolol (Toprol) two hours prior to test.  TAKE 50 mg once one the morning of this test. HOLD Furosemide/Hydrochlorothiazide morning of the test. FEMALES- please wear underwire-free bra if available       After the Test: Drink plenty of water. After receiving IV contrast, you may experience a mild flushed feeling. This is normal. On occasion, you may  experience a mild rash up to 24 hours after the test. This is not dangerous. If this occurs, you can take Benadryl 25 mg and increase your fluid intake. If you experience trouble breathing, this can be serious. If it is severe call 911 IMMEDIATELY. If it is mild, please call our office. If you take any of these medications: Glipizide/Metformin, Avandament, Glucavance, please do  not take 48 hours after completing test unless otherwise instructed.   Once we have confirmed authorization from your insurance company, we will call you to set up a date and time for your test. Based on how quickly your insurance processes prior authorizations requests, please allow up to 4 weeks to be contacted for scheduling your Cardiac CT appointment. Be advised that routine Cardiac CT appointments could be scheduled as many as 8 weeks after your provider has ordered it.  For non-scheduling related questions, please contact the cardiac imaging nurse navigator should you have any questions/concerns: Marchia Bond, Cardiac Imaging Nurse Navigator Gordy Clement, Cardiac Imaging Nurse Navigator [facility] Direct Office Dial: [phone]   For scheduling needs, including cancellations and rescheduling, please call [place], [phone].       Electrophysiology/Ablation Procedure Instructions   You are scheduled for a(n)  ablation on 12/10/2021 with Dr. Allegra Lai.   1.   Pre procedure testing-             A.  LAB WORK --- On 11/25/2021 for your pre procedure blood work.  You do NOT need to be fasting.  You can stop by the [employer] office anytime between 7:30 am - 4:30 pm.   On the day of your procedure 12/10/2021 you will go to [facility] hospital ([address]) at 8:30 am.  Dennis Bast will go to the main entrance A [employer]) and enter where the [employer] are.  Your driver will drop you off and you will head down the hallway to ADMITTING.  You may have one support person come in to the hospital with you.  They will be asked to wait in the waiting room. It is OK to have someone drop you off and come back when you are ready to be discharged.   3.   Do not eat or drink after midnight prior to your procedure.   4.   On the morning of your procedure do NOT take any medication. Do not miss any doses of your blood thinner prior to the morning of  your procedure or your procedure will need to be rescheduled.   5.  Plan for an overnight stay but you may be discharged after your procedure, if you use your phone frequently bring your phone charger. If you are discharged after your procedure you will need someone to drive you home and be with you for 24 hours after your procedure.   6. You will follow up with the AFIB clinic 4 weeks after your procedure.  You will follow up with Dr. Curt Bears  3 months after your procedure.  These appointments will be made for you.   7. FYI: For your safety, and to allow [place] to monitor your vital signs accurately during the surgery/procedure we request that if you have artificial nails, gel coating, SNS etc. Please have those removed prior to your surgery/procedure. Not having the nail coverings /polish removed may result in cancellation or delay of your surgery/procedure.  * If you have ANY questions please call the office [phone] and ask  for [employer] or send me a MyChart message   * Occasionally, EP Studies and ablations can become lengthy.  Please make your family aware of this before your procedure starts.  Average time ranges from 2-8 hours for EP studies/ablations.  Your physician will call your family after the procedure with the results.                                    Cardiac Ablation Cardiac ablation is a procedure to destroy (ablate) some heart tissue that is sending bad signals. These bad signals cause problems in heart rhythm. The heart has many areas that make these signals. If there are problems in these areas, they can make the heart beat in a way that is not normal. Destroying some tissues can help make the heart rhythm normal. Tell your doctor about: Any allergies you have. All medicines you are taking. These include vitamins, herbs, eye drops, creams, and over-the-counter medicines. Any problems you or family members have had with medicines that make you fall asleep  (anesthetics). Any blood disorders you have. Any surgeries you have had. Any medical conditions you have, such as kidney failure. Whether you are pregnant or may be pregnant. What are the risks? This is a safe procedure. But problems may occur, including: Infection. Bruising and bleeding. Bleeding into the chest. Stroke or blood clots. Damage to nearby areas of your body. Allergies to medicines or dyes. The need for a pacemaker if the normal system is damaged. Failure of the procedure to treat the problem. What happens before the procedure? Medicines Ask your doctor about: Changing or stopping your normal medicines. This is important. Taking aspirin and ibuprofen. Do not take these medicines unless your doctor tells you to take them. Taking other medicines, vitamins, herbs, and supplements. General instructions Follow instructions from your doctor about what you cannot eat or drink. Plan to have someone take you home from the hospital or clinic. If you will be going home right after the procedure, plan to have someone with you for 24 hours. Ask your doctor what steps will be taken to prevent infection. What happens during the procedure?  An IV tube will be put into one of your veins. You will be given a medicine to help you relax. The skin on your neck or groin will be numbed. A cut (incision) will be made in your neck or groin. A needle will be put through your cut and into a large vein. A tube (catheter) will be put into the needle. The tube will be moved to your heart. Dye may be put through the tube. This helps your doctor see your heart. Small devices (electrodes) on the tube will send out signals. A type of energy will be used to destroy some heart tissue. The tube will be taken out. Pressure will be held on your cut. This helps stop bleeding. A bandage will be put over your cut. The exact procedure may vary among doctors and hospitals. What happens after the  procedure? You will be watched until you leave the hospital or clinic. This includes checking your heart rate, breathing rate, oxygen, and blood pressure. Your cut will be watched for bleeding. You will need to lie still for a few hours. Do not drive for 24 hours or as long as your doctor tells you. Summary Cardiac ablation is a procedure to destroy some heart tissue. This is done  to treat heart rhythm problems. Tell your doctor about any medical conditions you may have. Tell him or her about all medicines you are taking to treat them. This is a safe procedure. But problems may occur. These include infection, bruising, bleeding, and damage to nearby areas of your body. Follow what your doctor tells you about food and drink. You may also be told to change or stop some of your medicines. After the procedure, do not drive for 24 hours or as long as your doctor tells you. This information is not intended to replace advice given to you by your health care provider. Make sure you discuss any questions you have with your health care provider. Document Revised: 10/06/2019 Document Reviewed: 10/06/2019 Elsevier Patient Education  2022 [employer].

## 2021-11-05 NOTE — Progress Notes (Signed)
Electrophysiology Office Note   Date:  11/05/2021   ID:  Kayla, Bennett 1951/03/22, MRN [id number]  PCP:  Kayla Bud, MD  Cardiologist:  Kayla Bennett Primary Electrophysiologist:  Kayla Tanori Meredith Leeds, MD    Chief Complaint: AF   History of Present Illness: Kayla Bennett is a 70 y.o. female who is being seen today for the evaluation of AF at the request of Bennett, Kayla R, PA. Presenting today for electrophysiology evaluation.  She has a history significant for hypertension, diabetes, breast cancer, fibromyalgia, atrial fibrillation.  She was diagnosed with atrial fibrillation in May 2022 on a preoperative ECG.  She was started on Eliquis.  She had a cardioversion 04/04/2021 but went back into atrial fibrillation 07/10/2021.  She was started on flecainide and metoprolol.  She had a repeat cardioversion 08/14/2021.  Unfortunately she went back into atrial fibrillation on 08/23/2021.  Her flecainide was increased and she had cardioversion 09/19/2021.  She does have a history of anemia, followed by hematology.  Today, she denies symptoms of palpitations, chest pain, shortness of breath, orthopnea, PND, lower extremity edema, claudication, dizziness, presyncope, syncope, bleeding, or neurologic sequela. The patient is tolerating medications without difficulties.  She has had multiple issues over the past few years, but her main complaint is fatigue.  She states that she sleeps a lot at the time.  She is going to have another sleep study performed.  She is stopped doing a lot of her daily activities due to her level of fatigue.   Past Medical History:  Diagnosis Date   Anemia    Arthritis    Asthma    Breast cancer ([place])    Diabetes ([place])    Dyspnea    Dysrhythmia    afibb on occasion   Family history of adverse reaction to anesthesia    mother had reaction to versed...during surgery, stopped breathing   Fibromyalgia    GERD (gastroesophageal reflux disease)    Headache     History of hiatal hernia    Hypertension    Migraines    Osteopenia    Psoriasis    Yeast infection 10/30/2021   Past Surgical History:  Procedure Laterality Date   ABDOMINAL HYSTERECTOMY     APPENDECTOMY     BREAST LUMPECTOMY WITH RADIOACTIVE SEED AND SENTINEL LYMPH NODE BIOPSY Right 02/18/2021   Procedure: RIGHT BREAST LUMPECTOMY WITH RADIOACTIVE SEED AND SENTINEL LYMPH NODE BIOPSY;  Surgeon: [name], MD;  Location: [place];  Service: General;  Laterality: Right;   broken wrist     CARDIOVERSION N/A 04/04/2021   Procedure: CARDIOVERSION;  Surgeon: Sueanne Margarita, MD;  Location: [place] [facility];  Service: Cardiovascular;  Laterality: N/A;   CARDIOVERSION N/A 08/14/2021   Procedure: CARDIOVERSION;  Surgeon: Geralynn Rile, MD;  Location: [place];  Service: Cardiovascular;  Laterality: N/A;   CARDIOVERSION N/A 09/19/2021   Procedure: CARDIOVERSION;  Surgeon: Geralynn Rile, MD;  Location: [place];  Service: Cardiovascular;  Laterality: N/A;   [place]     right wrist   HYSTERECTOMY ABDOMINAL WITH SALPINGO-OOPHORECTOMY     LYSIS OF ADHESION     SHOULDER SURGERY Left    TUBAL LIGATION       Current Outpatient Medications  Medication Sig Dispense Refill   acetaminophen (TYLENOL) 500 MG tablet Take 500 mg by mouth every 6 (six) hours as needed for moderate pain, headache or mild pain.  albuterol (VENTOLIN HFA) 108 (90 Base) MCG/ACT inhaler Inhale 1-2 puffs into the lungs every 6 (six) hours as needed for shortness of breath or wheezing (Asthma).     apixaban (ELIQUIS) 5 MG TABS tablet Take 1 tablet (5 mg total) by mouth 2 (two) times daily. Pt needs 90 day supply.  Please keep on file till next refill. 180 tablet 3   calcipotriene (DOVONOX) 0.005 % cream Apply 1 application topically See admin instructions. Mix with fluorouracil 5% cream and apply topically twice daily for five days - as needed for  precancerous on right cheek.     cetirizine (ZYRTEC) 10 MG tablet Take 10 mg by mouth in the morning.     cholecalciferol (VITAMIN D3) 25 MCG (1000 UNIT) tablet Take 1,000-2,000 Units by mouth See admin instructions. Take 1000 units in the morning and 2000 units at bedtime     ferrous sulfate 325 (65 FE) MG tablet Take 325 mg by mouth every morning.     fluconazole (DIFLUCAN) 100 MG tablet Take 1 tablet (100 mg total) by mouth daily. 10 tablet 0   fluorouracil (EFUDEX) 5 % cream Apply 1 application topically 2 (two) times daily as needed (precancerous spots on right cheek). Mix with calcipotriene 0.005% cream and apply topically twice daily for five days - as needed for precancerous on right cheek.     gabapentin (NEURONTIN) 100 MG capsule Take 200 mg by mouth at bedtime.     losartan-hydrochlorothiazide (HYZAAR) 100-25 MG tablet Take 1 tablet by mouth daily. 30 tablet 3   metFORMIN (GLUCOPHAGE-XR) 500 MG 24 hr tablet Take 500 mg by mouth daily with supper.     metoprolol succinate (TOPROL XL) 25 MG 24 hr tablet Take 1 tablet (25 mg total) by mouth daily.     pantoprazole (PROTONIX) 40 MG tablet Take 40 mg by mouth 2 (two) times daily before a meal.     potassium chloride SA (KLOR-CON M20) 20 MEQ tablet Take 1 tablet (20 mEq total) by mouth daily. 30 tablet 6   Prenatal Vit-Fe Fumarate-FA (PRENATAL VITAMIN PLUS LOW IRON PO) Take 1 tablet by mouth in the morning.     SUMAtriptan (IMITREX) 100 MG tablet Take 100 mg by mouth See admin instructions. Take one tablet (100 mg) by mouth at onset of migraine headache, may repeat in 2 hours if headache persists or recurs.     tamoxifen (NOLVADEX) 20 MG tablet TAKE 1 TABLET(20 MG) BY MOUTH DAILY (Patient taking differently: Take 20 mg by mouth every morning.) 30 tablet 3   triamcinolone (KENALOG) 0.1 % Apply 1 application topically 2 (two) times daily as needed (psoriasis). Mixed with Cetaphil 1:1     venlafaxine XR (EFFEXOR-XR) 37.5 MG 24 hr capsule Take 37.5  mg by mouth at bedtime. Take 1 capsule (75 mg) + 1 capsule (37.5 mg) to equal 112.5 mg at bedtime     venlafaxine XR (EFFEXOR-XR) 75 MG 24 hr capsule Take 75 mg by mouth at bedtime. Take 1 capsule (75 mg) + 1 capsule (37.5 mg) to equal 112.5 mg  at bedtime     vitamin B-12 (CYANOCOBALAMIN) 1000 MCG tablet Take 1,000 mcg by mouth every morning.     vitamin C (ASCORBIC ACID) 500 MG tablet Take 500 mg by mouth daily.     No current facility-administered medications for this visit.    Allergies:   Lotensin [benazepril hcl], Morphine, Erythromycin base, Hydrocodone, Other, Banana, Chocolate, Gluten meal, Tizanidine, and Wound dressing adhesive   Social  History:  The patient  reports that she has never smoked. She has never used smokeless tobacco. She reports that she does not drink alcohol and does not use drugs.   Family History:  The patient's family history includes Atrial fibrillation in her cousin and mother; Cancer in her maternal uncle; Colon cancer in her cousin and paternal uncle; Colon cancer (age of onset: 34) in her father; Heart attack (age of onset: 19) in her father; Heart attack (age of onset: 67) in her paternal grandmother; Heart attack (age of onset: 15) in her mother.    ROS:  Please see the history of present illness.   Otherwise, review of systems is positive for none.   All other systems are reviewed and negative.    PHYSICAL EXAM: VS:  BP 116/74    Pulse 75    Ht 5\' 2"  (1.575 m)    Wt 197 lb 6.4 oz (89.5 kg)    SpO2 97%    BMI 36.10 kg/m  , BMI Body mass index is 36.1 kg/m. GEN: Well nourished, well developed, in no acute distress  HEENT: normal  Neck: no JVD, carotid bruits, or masses Cardiac: RRR; no murmurs, rubs, or gallops,no edema  Respiratory:  clear to auscultation bilaterally, normal work of breathing GI: soft, nontender, nondistended, + BS MS: no deformity or atrophy  Skin: warm and dry Neuro:  Strength and sensation are intact Psych: euthymic mood, full  affect  EKG:  EKG is ordered today. Personal review of the ekg ordered shows atrial fibrillation, rate 75  Recent Labs: 10/12/2021: B Natriuretic Peptide 277.5 10/13/2021: TSH 2.875 10/16/2021: Magnesium 1.9 10/25/2021: ALT 21; BUN 22; Creatinine 0.96; Hemoglobin 12.0; Platelet Count 272; Potassium 3.9; Sodium 141    Lipid Panel  No results found for: CHOL, TRIG, HDL, CHOLHDL, VLDL, LDLCALC, LDLDIRECT   Wt Readings from Last 3 Encounters:  11/05/21 197 lb 6.4 oz (89.5 kg)  10/30/21 196 lb 9.6 oz (89.2 kg)  10/25/21 196 lb 14.4 oz (89.3 kg)      Other studies Reviewed: Additional studies/ records that were reviewed today include: TTE 12/21/20  Review of the above records today demonstrates:   1. Left ventricular ejection fraction, by estimation, is 50 to 55%. The  left ventricle has low normal function. The left ventricle has no regional  wall motion abnormalities. Left ventricular diastolic parameters are  indeterminate.   2. Right ventricular systolic function is normal. The right ventricular  size is not well visualized.   3. Left atrial size was moderately dilated.   4. The mitral valve is normal in structure. At most moderate mitral valve  regurgitation with variability in beat to beat assessment. Pulmonary vein  systolic blunting. 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 No evidence of mitral  stenosis.   5. The aortic valve is normal in structure. Aortic valve regurgitation is  not visualized. No aortic stenosis is present.   6. The inferior vena cava is normal in size with greater than 50%  respiratory variability, suggesting right atrial pressure of 3 mmHg.   Myoview 12/27/20 The left ventricular ejection fraction is hyperdynamic (>65%). Nuclear stress EF: 74%. There was no ST segment deviation noted during stress. The study is normal. This is a low risk study.    ASSESSMENT AND PLAN:  1.  Persistent atrial fibrillation: CHA2DS2-VASc of  4.  Currently on Toprol-XL 25 mg daily, Eliquis 5 mg twice daily.  Flecainide was recently stopped.  She would like to  get back into normal rhythm.  She would prefer to avoid medications.  Due to that we Whitni Pasquini plan for ablation.  Risk, benefits, and alternatives to EP study and radiofrequency ablation for afib were also discussed in detail today. These risks include but are not limited to stroke, bleeding, vascular damage, tamponade, perforation, damage to the esophagus, lungs, and other structures, pulmonary vein stenosis, worsening renal function, and death. The patient understands these risk and wishes to proceed.  We Crestina Strike therefore proceed with catheter ablation at the next available time.  Carto, ICE, anesthesia are requested for the procedure.  Sims Laday also obtain CT PV protocol prior to the procedure to exclude LAA thrombus and further evaluate atrial anatomy.   2.  Obesity: Body mass index is 36.1 kg/m.  Lifestyle modification encouraged.  3.  Snoring: Sleep study pending  4.  Hypertension: Currently well controlled  5.  Anemia: Due to iron and B12 deficiency.  Followed by hematology.  Case discussed with primary cardiology  Current medicines are reviewed at length with the patient today.   The patient does not have concerns regarding her medicines.  The following changes were made today:  none  Labs/ tests ordered today include:  Orders Placed This Encounter  Procedures   CT CARDIAC MORPH/PULM VEIN W/CM&W/O CA SCORE   Basic metabolic panel   CBC   EKG 12-Lead     Disposition:   FU with Takirah Binford 3 months  Signed, Izack Hoogland Meredith Leeds, MD  11/05/2021 9:45 AM     [facility] [address] [place] [address] [phone] (office) [phone] (fax)

## 2021-11-13 DIAGNOSIS — R928 Other abnormal and inconclusive findings on diagnostic imaging of breast: Secondary | ICD-10-CM | POA: Diagnosis not present

## 2021-11-14 ENCOUNTER — Other Ambulatory Visit: Payer: Self-pay

## 2021-11-14 ENCOUNTER — Encounter: Payer: Self-pay | Admitting: Family

## 2021-11-14 ENCOUNTER — Ambulatory Visit (INDEPENDENT_AMBULATORY_CARE_PROVIDER_SITE_OTHER): Payer: Medicare Other | Admitting: Family

## 2021-11-14 VITALS — BP 152/69 | HR 66 | Temp 97.9°F | Ht 62.0 in | Wt 200.0 lb

## 2021-11-14 DIAGNOSIS — N611 Abscess of the breast and nipple: Secondary | ICD-10-CM

## 2021-11-14 MED ORDER — DOXYCYCLINE HYCLATE 100 MG PO TABS: 100.0000 mg | ORAL_TABLET | Freq: Two times a day (BID) | ORAL | 0 refills | Status: DC

## 2021-11-14 MED ORDER — FLUCONAZOLE 150 MG PO TABS: ORAL_TABLET | ORAL | 0 refills | Status: DC

## 2021-11-14 MED ORDER — CEFDINIR 300 MG PO CAPS: 300.0000 mg | ORAL_CAPSULE | Freq: Two times a day (BID) | ORAL | 0 refills | Status: DC

## 2021-11-14 NOTE — Progress Notes (Signed)
Subjective:    Patient ID: Kayla Bennett, female    DOB: 24-Nov-1950, 70 y.o.   MRN: [id number]  Chief Complaint  Patient presents with   Follow-up    HPI:  Kayla Bennett is a 70 y.o. female with suspected infected breast cyst with no growth on previous cultures and treated with cefdinir and doxycycline last seen on 10/30/21 by Dr. Tommy Bennett with plans to hold antibiotics to optimize cultures of scheduled aspiration. Spoke with Kayla Bennett and aspiration was completed on 11/13/21 with gram stain and cultures pending. Here today for follow up.   Ms. Kayla Bennett had her aspiration yesterday and no fluid was able to be obtained. Unclear if tissue was sampled. Has been doing well since last office visit. Pain was initially reduced after hospitalization and has remained unchanged since that time. Still has soreness. Denies any worsening or systemic symptoms of fevers or chills since stopping antibiotics.    Allergies  Allergen Reactions   Lotensin [Benazepril Hcl] Swelling   Morphine Itching and Rash   Erythromycin Base Other (See Comments)    Unknown reaction   Hydrocodone Other (See Comments)    Unknown reaction     Other Swelling    Mustard - vaginal swelling/pain    Banana Swelling    vaginal swelling/pain   Chocolate Swelling    vaginal swelling/pain   Gluten Meal Diarrhea    Upset stomach    Tizanidine Other (See Comments)    Lowered blood pressure    Wound Dressing Adhesive Other (See Comments)    Skin burns, blisters       Outpatient Medications Prior to Visit  Medication Sig Dispense Refill   acetaminophen (TYLENOL) 500 MG tablet Take 500 mg by mouth every 6 (six) hours as needed for moderate pain, headache or mild pain.     albuterol (VENTOLIN HFA) 108 (90 Base) MCG/ACT inhaler Inhale 1-2 puffs into the lungs every 6 (six) hours as needed for shortness of breath or wheezing (Asthma).     apixaban (ELIQUIS) 5 MG TABS tablet Take 1 tablet (5 mg total) by mouth 2 (two)  times daily. Pt needs 90 day supply.  Please keep on file till next refill. 180 tablet 3   cetirizine (ZYRTEC) 10 MG tablet Take 10 mg by mouth in the morning.     cholecalciferol (VITAMIN D3) 25 MCG (1000 UNIT) tablet Take 1,000-2,000 Units by mouth See admin instructions. Take 1000 units in the morning and 2000 units at bedtime     ferrous sulfate 325 (65 FE) MG tablet Take 325 mg by mouth every morning.     gabapentin (NEURONTIN) 100 MG capsule Take 200 mg by mouth at bedtime.     losartan-hydrochlorothiazide (HYZAAR) 100-25 MG tablet Take 1 tablet by mouth daily. 30 tablet 3   metFORMIN (GLUCOPHAGE-XR) 500 MG 24 hr tablet Take 500 mg by mouth daily with supper.     metoprolol succinate (TOPROL XL) 25 MG 24 hr tablet Take 1 tablet (25 mg total) by mouth daily.     pantoprazole (PROTONIX) 40 MG tablet Take 40 mg by mouth 2 (two) times daily before a meal.     potassium chloride SA (KLOR-CON M20) 20 MEQ tablet Take 1 tablet (20 mEq total) by mouth daily. 30 tablet 6   Prenatal Vit-Fe Fumarate-FA (PRENATAL VITAMIN PLUS LOW IRON PO) Take 1 tablet by mouth in the morning.     SUMAtriptan (IMITREX) 100 MG tablet Take 100 mg by mouth See admin instructions.  Take one tablet (100 mg) by mouth at onset of migraine headache, may repeat in 2 hours if headache persists or recurs.     tamoxifen (NOLVADEX) 20 MG tablet TAKE 1 TABLET(20 MG) BY MOUTH DAILY (Patient taking differently: Take 20 mg by mouth every morning.) 30 tablet 3   triamcinolone (KENALOG) 0.1 % Apply 1 application topically 2 (two) times daily as needed (psoriasis). Mixed with Cetaphil 1:1     venlafaxine XR (EFFEXOR-XR) 37.5 MG 24 hr capsule Take 37.5 mg by mouth at bedtime. Take 1 capsule (75 mg) + 1 capsule (37.5 mg) to equal 112.5 mg at bedtime     venlafaxine XR (EFFEXOR-XR) 75 MG 24 hr capsule Take 75 mg by mouth at bedtime. Take 1 capsule (75 mg) + 1 capsule (37.5 mg) to equal 112.5 mg  at bedtime     vitamin B-12 (CYANOCOBALAMIN) 1000  MCG tablet Take 1,000 mcg by mouth every morning.     vitamin C (ASCORBIC ACID) 500 MG tablet Take 500 mg by mouth daily.     calcipotriene (DOVONOX) 0.005 % cream Apply 1 application topically See admin instructions. Mix with fluorouracil 5% cream and apply topically twice daily for five days - as needed for precancerous on right cheek. (Patient not taking: Reported on 11/14/2021)     fluorouracil (EFUDEX) 5 % cream Apply 1 application topically 2 (two) times daily as needed (precancerous spots on right cheek). Mix with calcipotriene 0.005% cream and apply topically twice daily for five days - as needed for precancerous on right cheek. (Patient not taking: Reported on 11/14/2021)     fluconazole (DIFLUCAN) 100 MG tablet Take 1 tablet (100 mg total) by mouth daily. (Patient not taking: Reported on 11/14/2021) 10 tablet 0   No facility-administered medications prior to visit.     Past Medical History:  Diagnosis Date   Anemia    Arthritis    Asthma    Breast cancer ([place])    Diabetes ([place])    Dyspnea    Dysrhythmia    afibb on occasion   Family history of adverse reaction to anesthesia    mother had reaction to versed...during surgery, stopped breathing   Fibromyalgia    GERD (gastroesophageal reflux disease)    Headache    History of hiatal hernia    Hypertension    Migraines    Osteopenia    Psoriasis    Yeast infection 10/30/2021     Past Surgical History:  Procedure Laterality Date   ABDOMINAL HYSTERECTOMY     APPENDECTOMY     BREAST LUMPECTOMY WITH RADIOACTIVE SEED AND SENTINEL LYMPH NODE BIOPSY Right 02/18/2021   Procedure: RIGHT BREAST LUMPECTOMY WITH RADIOACTIVE SEED AND SENTINEL LYMPH NODE BIOPSY;  Surgeon: [name], MD;  Location: [place];  Service: General;  Laterality: Right;   broken wrist     CARDIOVERSION N/A 04/04/2021   Procedure: CARDIOVERSION;  Surgeon: Sueanne Margarita, MD;  Location: [place] [facility];  Service: Cardiovascular;  Laterality: N/A;   CARDIOVERSION  N/A 08/14/2021   Procedure: CARDIOVERSION;  Surgeon: Geralynn Rile, MD;  Location: [place];  Service: Cardiovascular;  Laterality: N/A;   CARDIOVERSION N/A 09/19/2021   Procedure: CARDIOVERSION;  Surgeon: Geralynn Rile, MD;  Location: [place];  Service: Cardiovascular;  Laterality: N/A;   [place]     right wrist   HYSTERECTOMY ABDOMINAL WITH SALPINGO-OOPHORECTOMY     LYSIS OF ADHESION  SHOULDER SURGERY Left    TUBAL LIGATION         Review of Systems  Constitutional:  Negative for appetite change, chills, diaphoresis, fatigue, fever and unexpected weight change.  Eyes:        Negative for acute change in vision  Respiratory:  Negative for chest tightness, shortness of breath and wheezing.   Cardiovascular:  Negative for chest pain.  Gastrointestinal:  Negative for diarrhea, nausea and vomiting.  Genitourinary:  Negative for dysuria, pelvic pain and vaginal discharge.  Musculoskeletal:  Negative for neck pain and neck stiffness.  Skin:  Negative for rash.  Neurological:  Negative for seizures, syncope, weakness and headaches.  Hematological:  Negative for adenopathy. Does not bruise/bleed easily.  Psychiatric/Behavioral:  Negative for hallucinations.      Objective:    BP (!) 152/69    Pulse 66    Temp 97.9 F (36.6 C) (Oral)    Ht 5\' 2"  (1.575 m)    Wt 200 lb (90.7 kg)    SpO2 97%    BMI 36.58 kg/m  Nursing note and vital signs reviewed.  Physical Exam Constitutional:      General: She is not in acute distress.    Appearance: She is well-developed.  Cardiovascular:     Rate and Rhythm: Normal rate and regular rhythm.     Heart sounds: Normal heart sounds.  Pulmonary:     Effort: Pulmonary effort is normal.     Breath sounds: Normal breath sounds.  Skin:    General: Skin is warm and dry.  Neurological:     Mental Status: She is alert.     Depression screen [facility] 2/9 11/14/2021  10/30/2021 10/23/2021  Decreased Interest 0 0 0  Down, Depressed, Hopeless 0 0 0  PHQ - 2 Score 0 0 0       Assessment & Plan:    Patient Active Problem List   Diagnosis Date Noted   Yeast infection 10/30/2021   Breast abscess    Sepsis ([place]) 10/12/2021   Fever 10/12/2021   Nausea 10/12/2021   Headache 10/12/2021   Generalized weakness 10/12/2021   Diabetes ([place])    Hypertension    GERD (gastroesophageal reflux disease)    Iron deficiency anemia 09/25/2021   Secondary hypercoagulable state ([place]) 07/26/2021   Persistent atrial fibrillation ([place])    Genetic testing 12/26/2020   Malignant neoplasm of upper-outer quadrant of right breast in female, estrogen receptor positive ([place]) 11/28/2020     Problem List Items Addressed This Visit       Other   Breast abscess - Primary    Kayla Bennett has breast cyst/abscess s/p attempted aspiration yesterday with no fluid able to be obtained. She has had no evidence of infection since stopping antibiotics and experienced no significant changes since her initial aspiration in the hospital. We discussed watchful waiting versus continued antibiotic treatment. At this point it appears prudent to continue with cefdinir and doxycyline for diagnostic purpose as well as therapeutic as there is the possibility this may not be infection. Given fluconazole 150 mg q 72 hours as needed for candidiasis. Will follow up with [facility] for any results as they become available. Will plan to follow up in 3 weeks or sooner if needed.         I have discontinued [place]ell Pier. Kayla Bennett's fluconazole. I have also changed her doxycycline and cefdinir. Additionally, I am having her start on fluconazole. Lastly, I am having her maintain her metFORMIN, cetirizine, pantoprazole,  gabapentin, venlafaxine XR, cholecalciferol, Prenatal Vit-Fe Fumarate-FA (PRENATAL VITAMIN PLUS LOW IRON PO), triamcinolone cream, albuterol, venlafaxine XR, vitamin C, SUMAtriptan, apixaban, potassium  chloride SA, metoprolol succinate, ferrous sulfate, vitamin B-12, fluorouracil, tamoxifen, acetaminophen, losartan-hydrochlorothiazide, and calcipotriene.   Meds ordered this encounter  Medications   fluconazole (DIFLUCAN) 150 MG tablet    Sig: Take 1 tablet by mouth every 72 hours as needed.    Dispense:  5 tablet    Refill:  0    Order Specific Question:   Supervising Provider    Answer:   Baxter Flattery, CYNTHIA [[id number]]   doxycycline (VIBRA-TABS) 100 MG tablet    Sig: Take 1 tablet (100 mg total) by mouth 2 (two) times daily.    Dispense:  60 tablet    Refill:  0    Order Specific Question:   Supervising Provider    Answer:   Carlyle Basques [[id number]]   cefdinir (OMNICEF) 300 MG capsule    Sig: Take 1 capsule (300 mg total) by mouth 2 (two) times daily.    Dispense:  60 capsule    Refill:  0    Order Specific Question:   Supervising Provider    Answer:   Carlyle Basques [[id number]]     Follow-up: Return in about 3 weeks (around 12/05/2021), or if symptoms worsen or fail to improve.   Terri Piedra, MSN, FNP-C Nurse Practitioner [facility] [place] number: [phone]

## 2021-11-14 NOTE — Assessment & Plan Note (Addendum)
Ms. Twiford has breast cyst/abscess s/p attempted aspiration yesterday with no fluid able to be obtained. She has had no evidence of infection since stopping antibiotics and experienced no significant changes since her initial aspiration in the hospital. We discussed watchful waiting versus continued antibiotic treatment. At this point it appears prudent to continue with cefdinir and doxycyline for diagnostic purpose as well as therapeutic as there is the possibility this may not be infection. Given fluconazole 150 mg q 72 hours as needed for candidiasis. Will follow up with [facility] for any results as they become available. Will plan to follow up in 3 weeks or sooner if needed.

## 2021-11-14 NOTE — Patient Instructions (Addendum)
Nice to see you  Restart taking your cefdinir and doxycycline.  Fluconazole as needed every 72 hours.   We will check on cultures  Plan for follow up in 3 weeks or sooner if needed.   Have a great day!

## 2021-11-18 ENCOUNTER — Encounter (INDEPENDENT_AMBULATORY_CARE_PROVIDER_SITE_OTHER): Payer: Medicare Other | Admitting: Cardiology

## 2021-11-18 DIAGNOSIS — G4734 Idiopathic sleep related nonobstructive alveolar hypoventilation: Secondary | ICD-10-CM | POA: Diagnosis not present

## 2021-11-18 DIAGNOSIS — G4733 Obstructive sleep apnea (adult) (pediatric): Secondary | ICD-10-CM | POA: Diagnosis not present

## 2021-11-19 ENCOUNTER — Other Ambulatory Visit: Payer: Self-pay

## 2021-11-19 ENCOUNTER — Ambulatory Visit: Payer: Medicare Other | Attending: General Surgery | Admitting: Physical Therapy

## 2021-11-19 ENCOUNTER — Encounter: Payer: Self-pay | Admitting: Physical Therapy

## 2021-11-19 ENCOUNTER — Ambulatory Visit: Payer: Medicare Other

## 2021-11-19 DIAGNOSIS — R293 Abnormal posture: Secondary | ICD-10-CM | POA: Diagnosis not present

## 2021-11-19 DIAGNOSIS — C50411 Malignant neoplasm of upper-outer quadrant of right female breast: Secondary | ICD-10-CM | POA: Diagnosis not present

## 2021-11-19 DIAGNOSIS — M25611 Stiffness of right shoulder, not elsewhere classified: Secondary | ICD-10-CM | POA: Insufficient documentation

## 2021-11-19 DIAGNOSIS — R600 Localized edema: Secondary | ICD-10-CM | POA: Diagnosis not present

## 2021-11-19 DIAGNOSIS — R262 Difficulty in walking, not elsewhere classified: Secondary | ICD-10-CM | POA: Insufficient documentation

## 2021-11-19 DIAGNOSIS — I10 Essential (primary) hypertension: Secondary | ICD-10-CM

## 2021-11-19 DIAGNOSIS — Z170 Estrogen receptor positive status [ER+]: Secondary | ICD-10-CM | POA: Diagnosis not present

## 2021-11-19 DIAGNOSIS — Z483 Aftercare following surgery for neoplasm: Secondary | ICD-10-CM | POA: Insufficient documentation

## 2021-11-19 DIAGNOSIS — M6281 Muscle weakness (generalized): Secondary | ICD-10-CM | POA: Insufficient documentation

## 2021-11-19 DIAGNOSIS — I4819 Other persistent atrial fibrillation: Secondary | ICD-10-CM

## 2021-11-19 DIAGNOSIS — R0683 Snoring: Secondary | ICD-10-CM

## 2021-11-19 NOTE — Procedures (Signed)
° °  Sleep Study Report  Patient Information Study Date: 11/18/21 Patient Name: Kayla Bennett Patient ID: [id number] Birth Date: 2051/09/05 Age: 71 Gender:Female BMI: 38.1 (W=207 lb, H=5' 2'') Referring Physician: Richardson Dopp, PA  TEST DESCRIPTION: Home sleep apnea testing was completed using the WatchPat, a Type 1 device, utilizing peripheral arterial tonometry (PAT), chest movement, actigraphy, pulse oximetry, pulse rate, body position and snore. AHI was calculated with apnea and hypopnea using valid sleep time as the denominator. RDI includes apneas, hypopneas, and RERAs. The data acquired and the scoring of sleep and all associated events were performed in accordance with the recommended standards and specifications as outlined in the AASM Manual for the Scoring of Sleep and Associated Events 2.2.0 (2015).  FINDINGS: 1. Severe Obstructive Sleep Apnea with AHI 55.8/hr. 2. ModerateCentral Sleep Apnea with pAHIc 27.5/h and 39.2% Cheyne Stokes Respirations. 3. Oxygen desaturations as low as 71%. 4. Severe snoring was present. O2 sats were < 88% for 60.2 min. 5. Total sleep time was 6 hrs and 47 min. 6. 11.1% of total sleep time was spent in REM sleep. 7. Normal sleep onset latency at 19 min. 8. Prolonged REM sleep onset latency at 255 min. 9. Total awakenings were 8.  DIAGNOSIS: Severe Obstructive Sleep Apnea (G47.33)  RECOMMENDATIONS: 1. Clinical correlation of these findings is necessary. The decision to treat obstructive sleep apnea (OSA) is usually based on the presence of apnea symptoms or the presence of associated medical conditions such as Hypertension, Congestive Heart Failure, Atrial Fibrillation or Obesity. The most common symptoms of OSA are snoring, gasping for breath while sleeping, daytime sleepiness and fatigue.  2. Initiating apnea therapy is recommended given the presence of symptoms and/or associated conditions. Recommend proceeding with one of the  following:   a. Auto-CPAP therapy with a pressure range of 5-20cm H2O.   b. An oral appliance (OA) that can be obtained from certain dentists with expertise in sleep medicine. These are primarily of use in non-obese patients with mild and moderate disease.   c. An ENT consultation which may be useful to look for specific causes of obstruction and possible treatment options.   d. If patient is intolerant to PAP therapy, consider referral to ENT for evaluation for hypoglossal nerve stimulator.  3. Close follow-up is necessary to ensure success with CPAP or oral appliance therapy for maximum benefit .  4. A follow-up oximetry study on CPAP is recommended to assess the adequacy of therapy and determine the need for supplemental oxygen or the potential need for Bi-level therapy. An arterial blood gas to determine the adequacy of baseline ventilation and oxygenation should also be considered.  5. Healthy sleep recommendations include: adequate nightly sleep (normal 7-9 hrs/night), avoidance of caffeine after noon and alcohol near bedtime, and maintaining a sleep environment that is cool, dark and quiet.  6. Weight loss for overweight patients is recommended. Even modest amounts of weight loss can significantly improve the severity of sleep apnea.  7. Snoring recommendations include: weight loss where appropriate, side sleeping, and avoidance of alcohol before bed.  8. Operation of motor vehicle should be avoided when sleepy.  Signature: Electronically Signed: 11/19/21 Fransico Him, MD; [facility]; [place], American Board of Sleep Medicine

## 2021-11-19 NOTE — Therapy (Signed)
[facility] @ [place], [place], [address] Phone: [phone]   Fax:  [phone]  Physical Therapy Treatment  Patient Details  Name: Kayla Bennett MRN: [id number] Date of Birth: 25-Jun-1951 Referring Provider (PT): Dr. [name]   Encounter Date: 11/19/2021   PT End of Session - 11/19/21 1100     Visit Number 3    Number of Visits 9    Date for PT Re-Evaluation 11/21/21    PT Start Time 1001    PT Stop Time 1057    PT Time Calculation (min) 56 min    Activity Tolerance Patient tolerated treatment well    Behavior During Therapy [facility] for tasks assessed/performed             Past Medical History:  Diagnosis Date   Anemia    Arthritis    Asthma    Breast cancer ([place])    Diabetes ([place])    Dyspnea    Dysrhythmia    afibb on occasion   Family history of adverse reaction to anesthesia    mother had reaction to versed...during surgery, stopped breathing   Fibromyalgia    GERD (gastroesophageal reflux disease)    Headache    History of hiatal hernia    Hypertension    Migraines    Osteopenia    Psoriasis    Yeast infection 10/30/2021    Past Surgical History:  Procedure Laterality Date   ABDOMINAL HYSTERECTOMY     APPENDECTOMY     BREAST LUMPECTOMY WITH RADIOACTIVE SEED AND SENTINEL LYMPH NODE BIOPSY Right 02/18/2021   Procedure: RIGHT BREAST LUMPECTOMY WITH RADIOACTIVE SEED AND SENTINEL LYMPH NODE BIOPSY;  Surgeon: [name], MD;  Location: [facility];  Service: General;  Laterality: Right;   broken wrist     CARDIOVERSION N/A 04/04/2021   Procedure: CARDIOVERSION;  Surgeon: Sueanne Margarita, MD;  Location: [facility];  Service: Cardiovascular;  Laterality: N/A;   CARDIOVERSION N/A 08/14/2021   Procedure: CARDIOVERSION;  Surgeon: Geralynn Rile, MD;  Location: [facility];  Service: Cardiovascular;  Laterality: N/A;   CARDIOVERSION N/A 09/19/2021   Procedure: CARDIOVERSION;  Surgeon:  Geralynn Rile, MD;  Location: [facility];  Service: Cardiovascular;  Laterality: N/A;   [place]     right wrist   HYSTERECTOMY ABDOMINAL WITH SALPINGO-OOPHORECTOMY     LYSIS OF ADHESION     SHOULDER SURGERY Left    TUBAL LIGATION      There were no vitals filed for this visit.   Subjective Assessment - 11/19/21 1002     Subjective They tried to aspirate that spot but they were not able to get anything out. I am ok to begin PT now.    Pertinent History Patient was diagnosed on 11/22/2020 with right invasive ductal carcinoma breast cancer. She underwent a right lumpectomy and sentinel node biopsy (5 negative nodes) on 02/18/2021. It is ER/PR positive and HER2 negative with a Ki67 of 10%. She has many orthopedic problems from a traumatic fall and other accidents, pt has had 3 cardioversions this year for atrial fib on 04/04/21, 08/14/21 and 09/19/21, has hx of rotator cuff surgery on the R    Patient Stated Goals I want to be safe when I walk, I have stopped doing jobs that I have to walk any distance    Currently in Pain? No/denies    Pain Score  0-No pain                [facility] PT Assessment - 11/19/21 0001       AROM   Right Shoulder Flexion 135 Degrees    Right Shoulder ABduction 120 Degrees    Right Shoulder Internal Rotation 31 Degrees                           [facility] Adult PT Treatment/Exercise - 11/19/21 0001       Manual Therapy   Manual Therapy Soft tissue mobilization;Myofascial release    Soft tissue mobilization in supine using cocoa butter to R pec and area of seroma in R breast and then in  L s/l to R lats (extrememly tight with numerous trigger points noted), rhomboids, and UT    Myofascial Release gently to cording in R axilla and along SLNB scar which was very tight and tender and improved with manual therapy                          PT Long Term Goals -  10/17/21 1635       PT LONG TERM GOAL #1   Title Pt will demonstrate 4/5 bilateral hip flexion strength to decrease fall risk    Baseline R 3+, L 3-    Time 5    Period Weeks    Status New    Target Date 11/21/21      PT LONG TERM GOAL #2   Title Pt will demonstrate 4+/5 R knee flexion strength to decrease risk of falls    Baseline 3/5    Time 5    Period Weeks    Status New    Target Date 11/21/21      PT LONG TERM GOAL #3   Title Pt will demonstrate 4+/5 bilateral  knee extensor strength to decrease risk of falls.    Baseline 3/5 R, 4/5 L    Time 5    Period Weeks    Status New    Target Date 11/21/21      PT LONG TERM GOAL #4   Title Pt will report she feels safe returning to work because she is not afraid of falling    Time 5    Period Weeks    Status New    Target Date 11/21/21      PT LONG TERM GOAL #5   Title Pt will be indepdent in a home exercise program for continued stretching and strengthening    Time 5    Period Weeks    Status New    Target Date 11/21/21                   Plan - 11/19/21 1103     Clinical Impression Statement Remeasured pt's shoulder ROM today and it is similar to last visit. Pt went to have seroma drained to check for infection but they were unable to drain any fluid. She has been cleared for manual therapy. She still has cording and tightness in R axilla which limits her ROM in addition to an old rotator cuff injury. She has numerous trigger points in her lats and serratus as well as pecs that therapist worked on today. Improvements noted by end of session decreased tenderness.    PT Frequency 2x / week    PT Duration --   5 weeks   PT Treatment/Interventions ADLs/Self  Care Home Management;Therapeutic exercise;Patient/family education;Therapeutic activities;Neuromuscular re-education;Balance training;Gait training;Stair training;Manual techniques;Manual lymph drainage;Passive range of motion    PT Next Visit Plan **Do SOZO  again when not in Afib** gentle AAROM to R shoulder, MFR to cord in R axilla after aspiration procedure and visit with ID, cont LE strengthening and high level balance exercises Rt leg is the weakest    PT Home Exercise Plan educated pt to do exercises given to her by hospital    Consulted and Agree with Plan of Care Patient             Patient will benefit from skilled therapeutic intervention in order to improve the following deficits and impairments:  Postural dysfunction, Decreased range of motion, Decreased knowledge of precautions, Impaired UE functional use, Pain, Increased fascial restricitons, Decreased strength, Decreased activity tolerance, Increased edema, Difficulty walking, Decreased balance, Decreased endurance  Visit Diagnosis: Muscle weakness (generalized)  Difficulty in walking, not elsewhere classified  Stiffness of right shoulder, not elsewhere classified  Localized edema  Abnormal posture  Malignant neoplasm of upper-outer quadrant of right breast in female, estrogen receptor positive ([place])  Aftercare following surgery for neoplasm     Problem List Patient Active Problem List   Diagnosis Date Noted   Yeast infection 10/30/2021   Breast abscess    Sepsis ([place]) 10/12/2021   Fever 10/12/2021   Nausea 10/12/2021   Headache 10/12/2021   Generalized weakness 10/12/2021   Diabetes ([place])    Hypertension    GERD (gastroesophageal reflux disease)    Iron deficiency anemia 09/25/2021   Secondary hypercoagulable state ([place]) 07/26/2021   Persistent atrial fibrillation ([facility])    Genetic testing 12/26/2020   Malignant neoplasm of upper-outer quadrant of right breast in female, estrogen receptor positive ([place]) 11/28/2020    Allyson Sabal [place], PT 11/19/2021, 11:05 AM  [facility] @ [place], [place], [address] Phone: [phone]   Fax:  [phone]  Name: Kayla Bennett MRN:  [id number] Date of Birth: 31-Jan-1951  Manus Gunning, PT 11/19/21 11:05 AM

## 2021-11-21 ENCOUNTER — Encounter: Payer: Self-pay | Admitting: Rehabilitation

## 2021-11-21 ENCOUNTER — Ambulatory Visit: Payer: Medicare Other | Admitting: Rehabilitation

## 2021-11-21 ENCOUNTER — Other Ambulatory Visit: Payer: Self-pay

## 2021-11-21 DIAGNOSIS — R293 Abnormal posture: Secondary | ICD-10-CM

## 2021-11-21 DIAGNOSIS — C50411 Malignant neoplasm of upper-outer quadrant of right female breast: Secondary | ICD-10-CM

## 2021-11-21 DIAGNOSIS — M6281 Muscle weakness (generalized): Secondary | ICD-10-CM

## 2021-11-21 DIAGNOSIS — Z483 Aftercare following surgery for neoplasm: Secondary | ICD-10-CM

## 2021-11-21 DIAGNOSIS — R600 Localized edema: Secondary | ICD-10-CM | POA: Diagnosis not present

## 2021-11-21 DIAGNOSIS — Z170 Estrogen receptor positive status [ER+]: Secondary | ICD-10-CM

## 2021-11-21 DIAGNOSIS — R262 Difficulty in walking, not elsewhere classified: Secondary | ICD-10-CM

## 2021-11-21 DIAGNOSIS — M25611 Stiffness of right shoulder, not elsewhere classified: Secondary | ICD-10-CM

## 2021-11-21 NOTE — Therapy (Signed)
[facility] @ [place], [place], [address] Phone: [phone]   Fax:  [phone]  Physical Therapy Treatment  Patient Details  Name: Kayla Bennett MRN: [id number] Date of Birth: November 01, 1951 Referring Provider (PT): Dr. [name]   Encounter Date: 11/21/2021   PT End of Session - 11/21/21 1150     Visit Number 4    Number of Visits 9    Date for PT Re-Evaluation 11/21/21    PT Start Time 1100    PT Stop Time 1151    PT Time Calculation (min) 51 min    Activity Tolerance Patient tolerated treatment well    Behavior During Therapy [facility] for tasks assessed/performed             Past Medical History:  Diagnosis Date   Anemia    Arthritis    Asthma    Breast cancer ([place])    Diabetes ([place])    Dyspnea    Dysrhythmia    afibb on occasion   Family history of adverse reaction to anesthesia    mother had reaction to versed...during surgery, stopped breathing   Fibromyalgia    GERD (gastroesophageal reflux disease)    Headache    History of hiatal hernia    Hypertension    Migraines    Osteopenia    Psoriasis    Yeast infection 10/30/2021    Past Surgical History:  Procedure Laterality Date   ABDOMINAL HYSTERECTOMY     APPENDECTOMY     BREAST LUMPECTOMY WITH RADIOACTIVE SEED AND SENTINEL LYMPH NODE BIOPSY Right 02/18/2021   Procedure: RIGHT BREAST LUMPECTOMY WITH RADIOACTIVE SEED AND SENTINEL LYMPH NODE BIOPSY;  Surgeon: [name], MD;  Location: [facility];  Service: General;  Laterality: Right;   broken wrist     CARDIOVERSION N/A 04/04/2021   Procedure: CARDIOVERSION;  Surgeon: Sueanne Margarita, MD;  Location: [facility];  Service: Cardiovascular;  Laterality: N/A;   CARDIOVERSION N/A 08/14/2021   Procedure: CARDIOVERSION;  Surgeon: Geralynn Rile, MD;  Location: [facility];  Service: Cardiovascular;  Laterality: N/A;   CARDIOVERSION N/A 09/19/2021   Procedure: CARDIOVERSION;  Surgeon:  Geralynn Rile, MD;  Location: [facility];  Service: Cardiovascular;  Laterality: N/A;   [place]     right wrist   HYSTERECTOMY ABDOMINAL WITH SALPINGO-OOPHORECTOMY     LYSIS OF ADHESION     SHOULDER SURGERY Left    TUBAL LIGATION      There were no vitals filed for this visit.   Subjective Assessment - 11/21/21 1103     Subjective I am back on antibiotics    Pertinent History Patient was diagnosed on 11/22/2020 with right invasive ductal carcinoma breast cancer. She underwent a right lumpectomy and sentinel node biopsy (5 negative nodes) on 02/18/2021. It is ER/PR positive and HER2 negative with a Ki67 of 10%. She has many orthopedic problems from a traumatic fall and other accidents, pt has had 3 cardioversions this year for atrial fib on 04/04/21, 08/14/21 and 09/19/21, has hx of rotator cuff surgery on the R    Patient Stated Goals I want to be safe when I walk, I have stopped doing jobs that I have to walk any distance    Currently in Pain? No/denies  [facility] Adult PT Treatment/Exercise - 11/21/21 0001       Exercises   Exercises Shoulder      Shoulder Exercises: Standing   Other Standing Exercises reviewed wall slides at home flexion and abduction      Manual Therapy   Manual Therapy Passive ROM    Soft tissue mobilization in supine using cocoa butter to R pec and area of seroma in R breast and then in  L s/l to R lats (extrememly tight with numerous trigger points noted), rhomboids, and UT - pt does note she is always a bit tender from fibromyalgia    Myofascial Release to cording in axilla which was not palpated much today    Passive ROM into flexion, abduction, ER limiting to pain point                          PT Long Term Goals - 10/17/21 1635       PT LONG TERM GOAL #1   Title Pt will demonstrate 4/5 bilateral hip flexion strength to  decrease fall risk    Baseline R 3+, L 3-    Time 5    Period Weeks    Status New    Target Date 11/21/21      PT LONG TERM GOAL #2   Title Pt will demonstrate 4+/5 R knee flexion strength to decrease risk of falls    Baseline 3/5    Time 5    Period Weeks    Status New    Target Date 11/21/21      PT LONG TERM GOAL #3   Title Pt will demonstrate 4+/5 bilateral  knee extensor strength to decrease risk of falls.    Baseline 3/5 R, 4/5 L    Time 5    Period Weeks    Status New    Target Date 11/21/21      PT LONG TERM GOAL #4   Title Pt will report she feels safe returning to work because she is not afraid of falling    Time 5    Period Weeks    Status New    Target Date 11/21/21      PT LONG TERM GOAL #5   Title Pt will be indepdent in a home exercise program for continued stretching and strengthening    Time 5    Period Weeks    Status New    Target Date 11/21/21                   Plan - 11/21/21 1151     Clinical Impression Statement continued Rt UE STM, cording release but this was hard to palpate today, and PROM was attempted today.  Pt is limited overall by tenderness and pain and as pt reports she can't tell if it is from fibromyalgia as the other side is very tender vs just from surgery.  AAROM also hard to do as pt reports the Lt UE is unable to help much.    PT Frequency 2x / week    PT Treatment/Interventions ADLs/Self Care Home Management;Therapeutic exercise;Patient/family education;Therapeutic activities;Neuromuscular re-education;Balance training;Gait training;Stair training;Manual techniques;Manual lymph drainage;Passive range of motion    PT Next Visit Plan **Do SOZO again when not in Afib** gentle  PROM to R shoulder, MFR to cord in R axilla if still present, try [place] maybe supine AROM due to Lt UE not able to help with AAROM  ( cont LE  strengthening and high level balance exercises Rt leg is the weakest)    Consulted and Agree with Plan of Care  Patient             Patient will benefit from skilled therapeutic intervention in order to improve the following deficits and impairments:     Visit Diagnosis: Muscle weakness (generalized)  Difficulty in walking, not elsewhere classified  Stiffness of right shoulder, not elsewhere classified  Localized edema  Abnormal posture  Malignant neoplasm of upper-outer quadrant of right breast in female, estrogen receptor positive [facility])  Aftercare following surgery for neoplasm     Problem List Patient Active Problem List   Diagnosis Date Noted   Yeast infection 10/30/2021   Breast abscess    Sepsis ([place]) 10/12/2021   Fever 10/12/2021   Nausea 10/12/2021   Headache 10/12/2021   Generalized weakness 10/12/2021   Diabetes ([place])    Hypertension    GERD (gastroesophageal reflux disease)    Iron deficiency anemia 09/25/2021   Secondary hypercoagulable state ([place]) 07/26/2021   Persistent atrial fibrillation ([place])    Genetic testing 12/26/2020   Malignant neoplasm of upper-outer quadrant of right breast in female, estrogen receptor positive ([place]) 11/28/2020    Stark Bray, PT 11/21/2021, 11:54 AM  [facility] @ [place], [place], [address] Phone: [phone]   Fax:  [phone]  Name: Kayla Bennett MRN: [id number] Date of Birth: Mar 20, 1951

## 2021-11-21 NOTE — Progress Notes (Signed)
Sleep study shows OSA. Pt to be set up for CPAP. Richardson Dopp, PA-C    11/21/2021 9:28 AM

## 2021-11-25 ENCOUNTER — Other Ambulatory Visit: Payer: Self-pay

## 2021-11-25 ENCOUNTER — Other Ambulatory Visit: Payer: Medicare Other | Admitting: *Deleted

## 2021-11-25 DIAGNOSIS — Z01812 Encounter for preprocedural laboratory examination: Secondary | ICD-10-CM

## 2021-11-25 DIAGNOSIS — I4819 Other persistent atrial fibrillation: Secondary | ICD-10-CM | POA: Diagnosis not present

## 2021-11-26 ENCOUNTER — Ambulatory Visit: Payer: Medicare Other | Admitting: Physical Therapy

## 2021-11-26 ENCOUNTER — Encounter: Payer: Self-pay | Admitting: Physical Therapy

## 2021-11-26 DIAGNOSIS — R293 Abnormal posture: Secondary | ICD-10-CM

## 2021-11-26 DIAGNOSIS — R600 Localized edema: Secondary | ICD-10-CM

## 2021-11-26 DIAGNOSIS — M25611 Stiffness of right shoulder, not elsewhere classified: Secondary | ICD-10-CM | POA: Diagnosis not present

## 2021-11-26 DIAGNOSIS — R262 Difficulty in walking, not elsewhere classified: Secondary | ICD-10-CM | POA: Diagnosis not present

## 2021-11-26 DIAGNOSIS — C50411 Malignant neoplasm of upper-outer quadrant of right female breast: Secondary | ICD-10-CM | POA: Diagnosis not present

## 2021-11-26 DIAGNOSIS — Z170 Estrogen receptor positive status [ER+]: Secondary | ICD-10-CM

## 2021-11-26 DIAGNOSIS — M6281 Muscle weakness (generalized): Secondary | ICD-10-CM

## 2021-11-26 LAB — BASIC METABOLIC PANEL
BUN/Creatinine Ratio: 24 (ref 12–28)
BUN: 23 mg/dL (ref 8–27)
CO2: 24 mmol/L (ref 20–29)
Calcium: 9.6 mg/dL (ref 8.7–10.3)
Chloride: 97 mmol/L (ref 96–106)
Creatinine, Ser: 0.94 mg/dL (ref 0.57–1.00)
Glucose: 102 mg/dL — ABNORMAL HIGH (ref 70–99)
Potassium: 4.2 mmol/L (ref 3.5–5.2)
Sodium: 139 mmol/L (ref 134–144)
eGFR: 65 mL/min/{1.73_m2} (ref >59)

## 2021-11-26 LAB — CBC
Hematocrit: 36.1 % (ref 34.0–46.6)
Hemoglobin: 12.1 g/dL (ref 11.1–15.9)
MCH: 30.6 pg (ref 26.6–33.0)
MCHC: 33.5 g/dL (ref 31.5–35.7)
MCV: 91 fL (ref 79–97)
Platelets: 250 10*3/uL (ref 150–450)
RBC: 3.95 x10E6/uL (ref 3.77–5.28)
RDW: 16 % — ABNORMAL HIGH (ref 11.7–15.4)
WBC: 6 10*3/uL (ref 3.4–10.8)

## 2021-11-26 NOTE — Therapy (Signed)
[facility] @ [place], [place], [address] Phone: [phone]   Fax:  [phone]  Physical Therapy Treatment  Patient Details  Name: Kayla Bennett MRN: [id number] Date of Birth: [birth year] Referring Provider (PT): Dr. [name]   Encounter Date: 11/26/2021   PT End of Session - 11/26/21 1159     Visit Number 5    Number of Visits 13    Date for PT Re-Evaluation 12/24/21    PT Start Time 1104    PT Stop Time 1158    PT Time Calculation (min) 54 min    Activity Tolerance Patient tolerated treatment well    Behavior During Therapy [facility] for tasks assessed/performed             Past Medical History:  Diagnosis Date   Anemia    Arthritis    Asthma    Breast cancer ([place])    Diabetes ([place])    Dyspnea    Dysrhythmia    afibb on occasion   Family history of adverse reaction to anesthesia    mother had reaction to versed...during surgery, stopped breathing   Fibromyalgia    GERD (gastroesophageal reflux disease)    Headache    History of hiatal hernia    Hypertension    Migraines    Osteopenia    Psoriasis    Yeast infection 10/30/2021    Past Surgical History:  Procedure Laterality Date   ABDOMINAL HYSTERECTOMY     APPENDECTOMY     BREAST LUMPECTOMY WITH RADIOACTIVE SEED AND SENTINEL LYMPH NODE BIOPSY Right 02/18/2021   Procedure: RIGHT BREAST LUMPECTOMY WITH RADIOACTIVE SEED AND SENTINEL LYMPH NODE BIOPSY;  Surgeon: [name], MD;  Location: [place];  Service: General;  Laterality: Right;   broken wrist     CARDIOVERSION N/A 04/04/2021   Procedure: CARDIOVERSION;  Surgeon: Sueanne Margarita, MD;  Location: [facility];  Service: Cardiovascular;  Laterality: N/A;   CARDIOVERSION N/A 08/14/2021   Procedure: CARDIOVERSION;  Surgeon: Geralynn Rile, MD;  Location: [facility];  Service: Cardiovascular;  Laterality: N/A;   CARDIOVERSION N/A 09/19/2021   Procedure: CARDIOVERSION;  Surgeon:  Geralynn Rile, MD;  Location: [facility];  Service: Cardiovascular;  Laterality: N/A;   [place]     right wrist   HYSTERECTOMY ABDOMINAL WITH SALPINGO-OOPHORECTOMY     LYSIS OF ADHESION     SHOULDER SURGERY Left    TUBAL LIGATION      There were no vitals filed for this visit.   Subjective Assessment - 11/26/21 1104     Subjective Pt feels off balance today and reports she almost had a fall. The tightness is getting better. I can really lift my arm now.    Pertinent History Patient was diagnosed on 11/22/2020 with right invasive ductal carcinoma breast cancer. She underwent a right lumpectomy and sentinel node biopsy (5 negative nodes) on 02/18/2021. It is ER/PR positive and HER2 negative with a Ki67 of 10%. She has many orthopedic problems from a traumatic fall and other accidents, pt has had 3 cardioversions this year for atrial fib on 04/04/21, 08/14/21 and 09/19/21, has hx of rotator cuff surgery on the R    Patient Stated Goals I want to be safe when I walk, I have stopped doing jobs that I have to walk any distance    Currently in Pain? No/denies  Pain Score 0-No pain                [facility] PT Assessment - 11/26/21 0001       AROM   Right Shoulder Flexion 155 Degrees    Right Shoulder ABduction 130 Degrees    Right Shoulder Internal Rotation 36 Degrees                           [facility] Adult PT Treatment/Exercise - 11/26/21 0001       Exercises   Exercises Knee/Hip      Knee/Hip Exercises: Standing   Hip Flexion Stengthening;Both;1 set;10 reps;Knee straight   with yellow theraband, v/c to keep knee straight   Hip ADduction Strengthening;Both;1 set;10 reps   with yellow theraband   Hip Abduction Stengthening;Both;1 set;10 reps;Knee straight   with yellow theraband, pt had difficulty coordinating this exercise   Hip Extension Stengthening;Both;1 set;10 reps;Knee straight   with yellow  band, difficulty coordinating exercise     Knee/Hip Exercises: Seated   Hamstring Curl Strengthening;Both;1 set;10 reps   with yellow theraband, increased difficulty on the R side     Manual Therapy   Soft tissue mobilization briefly to lats in supine with pt almost not able to tolerate any pressure due to pain in this area    Passive ROM into flexion, abduction, ER limiting to pain point                          PT Long Term Goals - 11/26/21 0001       PT LONG TERM GOAL #1   Title Pt will demonstrate 4/5 bilateral hip flexion strength to decrease fall risk    Baseline R 3+, L 3-; 11/26/21- R 3- L 3+    Time 5    Period Weeks    Status On-going    Target Date 11/21/21      PT LONG TERM GOAL #2   Title Pt will demonstrate 4+/5 R knee flexion strength to decrease risk of falls    Baseline 3/5; 11/26/21- 3/5    Time 5    Period Weeks    Status On-going    Target Date 11/21/21      PT LONG TERM GOAL #3   Title Pt will demonstrate 4+/5 bilateral  knee extensor strength to decrease risk of falls.    Baseline 3/5 R, 4/5 L    Time 5    Period Weeks    Status On-going    Target Date 11/21/21      PT LONG TERM GOAL #4   Title Pt will report she feels safe returning to work because she is not afraid of falling    Baseline 11/26/21- pt is still fearful of falling at work    Time 5    Period Weeks    Status On-going    Target Date 11/21/21      PT LONG TERM GOAL #5   Title Pt will be indepdent in a home exercise program for continued stretching and strengthening    Time 5    Period Weeks    Status On-going    Target Date 11/21/21                   Plan - 11/26/21 1139     Clinical Impression Statement Assessed pt's progress towards goals in therapy. Have spent much of the time in therapy  over the past few weeks on improving R shoulder ROM and decreasing muscle tightness to improve comfort. Pt's R shoulder motion is improving but she still has a  tendency to compensate. She also has numerous trigger points in her lats and serratus that she can barely stand to have touched. Began instructing pt in LE strengthening exercises today while monitoring her heart rate which was around 96 bpm since pt is in prolonged afib. She will be undergoing a cardiac ablation in two weeks. Issued these new exercises as part of pt's HEP. Pt has been staggering while ambulating more frequently lately and has almost fallen. Will work on Pharmacologist and see if this improves. Pt almost seems to have trouble coordinating exercises at times. Pt would benefit from addtional skilled PT services to improve bilateral LE strength, improve balance, improve R shoulder ROM and decrease muscle pain.    PT Frequency 2x / week    PT Duration 4 weeks   5 weeks   PT Treatment/Interventions ADLs/Self Care Home Management;Therapeutic exercise;Patient/family education;Therapeutic activities;Neuromuscular re-education;Balance training;Gait training;Stair training;Manual techniques;Manual lymph drainage;Passive range of motion    PT Next Visit Plan how were new HEP exercises? **Do SOZO again when not in Afib** gentle  PROM to R shoulder, MFR to cord in R axilla if still present, try ranger maybe supine AROM due to Lt UE not able to help with AAROM  ( cont LE strengthening and high level balance exercises Rt leg is the weakest)    PT Home Exercise Plan Access Code: [email or web address]    Consulted and Agree with Plan of Care Patient             Patient will benefit from skilled therapeutic intervention in order to improve the following deficits and impairments:  Postural dysfunction, Decreased range of motion, Decreased knowledge of precautions, Impaired UE functional use, Pain, Increased fascial restricitons, Decreased strength, Decreased activity tolerance, Increased edema, Difficulty walking, Decreased balance, Decreased endurance  Visit Diagnosis: Muscle weakness  (generalized)  Difficulty in walking, not elsewhere classified  Stiffness of right shoulder, not elsewhere classified  Localized edema  Abnormal posture  Malignant neoplasm of upper-outer quadrant of right breast in female, estrogen receptor positive ([place])     Problem List Patient Active Problem List   Diagnosis Date Noted   Yeast infection 10/30/2021   Breast abscess    Sepsis ([place]) 10/12/2021   Fever 10/12/2021   Nausea 10/12/2021   Headache 10/12/2021   Generalized weakness 10/12/2021   Diabetes ([place])    Hypertension    GERD (gastroesophageal reflux disease)    Iron deficiency anemia 09/25/2021   Secondary hypercoagulable state ([place]) 07/26/2021   Persistent atrial fibrillation ([facility])    Genetic testing 12/26/2020   Malignant neoplasm of upper-outer quadrant of right breast in female, estrogen receptor positive ([place]) 11/28/2020    Allyson Sabal [place], PT 11/26/2021, 12:11 PM  [facility] @ [place], [place], [address] Phone: [phone]   Fax:  [phone]  Name: ZYKERRIA TANTON MRN: [id number] Date of Birth: 16-Jul-1951

## 2021-11-26 NOTE — Patient Instructions (Signed)
Access Code: [email or web address] URL: [email or web address] Date: 11/26/2021 Prepared by: Manus Gunning  Exercises Seated Hamstring Curl with Anchored Resistance - 1 x daily - 7 x weekly - 1 sets - 10 reps Seated Hamstring Curl with Anchored Resistance (Mirrored) - 1 x daily - 7 x weekly - 1 sets - 10 reps Standing Hip Extension with Anchored Resistance - 1 x daily - 7 x weekly - 1 sets - 10 reps Standing Hip Extension with Anchored Resistance (Mirrored) - 1 x daily - 7 x weekly - 1 sets - 10 reps Standing Hip Abduction with Anchored Resistance - 1 x daily - 7 x weekly - 1 sets - 10 reps Standing Hip Abduction with Anchored Resistance (Mirrored) - 1 x daily - 7 x weekly - 1 sets - 10 reps Standing Hip Flexion with Anchored Resistance and Chair Support - 1 x daily - 7 x weekly - 1 sets - 10 reps Standing Hip Flexion with Anchored Resistance and Chair Support (Mirrored) - 1 x daily - 7 x weekly - 1 sets - 10 reps Standing Hip Adduction with Resistance - 1 x daily - 7 x weekly - 1 sets - 10 reps Standing Hip Adduction with Resistance (Mirrored) - 1 x daily - 7 x weekly - 1 sets - 10 reps

## 2021-11-27 ENCOUNTER — Encounter: Payer: Self-pay | Admitting: Physical Therapy

## 2021-11-27 ENCOUNTER — Ambulatory Visit: Payer: Medicare Other | Admitting: Physical Therapy

## 2021-11-27 ENCOUNTER — Other Ambulatory Visit: Payer: Self-pay

## 2021-11-27 DIAGNOSIS — R262 Difficulty in walking, not elsewhere classified: Secondary | ICD-10-CM

## 2021-11-27 DIAGNOSIS — M25611 Stiffness of right shoulder, not elsewhere classified: Secondary | ICD-10-CM

## 2021-11-27 DIAGNOSIS — M6281 Muscle weakness (generalized): Secondary | ICD-10-CM | POA: Diagnosis not present

## 2021-11-27 DIAGNOSIS — R600 Localized edema: Secondary | ICD-10-CM | POA: Diagnosis not present

## 2021-11-27 DIAGNOSIS — Z170 Estrogen receptor positive status [ER+]: Secondary | ICD-10-CM

## 2021-11-27 DIAGNOSIS — R293 Abnormal posture: Secondary | ICD-10-CM | POA: Diagnosis not present

## 2021-11-27 DIAGNOSIS — C50411 Malignant neoplasm of upper-outer quadrant of right female breast: Secondary | ICD-10-CM | POA: Diagnosis not present

## 2021-11-27 NOTE — Therapy (Signed)
[facility] @ [place], [place], [address] Phone: [phone]   Fax:  [phone]  Physical Therapy Treatment  Patient Details  Name: Kayla Bennett MRN: [id number] Date of Birth: 07-Apr-1951 Referring Provider (PT): Dr. [name]   Encounter Date: 11/27/2021   PT End of Session - 11/27/21 1600     Visit Number 6    Number of Visits 13    Date for PT Re-Evaluation 12/24/21    PT Start Time 1503    PT Stop Time 1600    PT Time Calculation (min) 57 min    Activity Tolerance Patient tolerated treatment well    Behavior During Therapy [facility] for tasks assessed/performed             Past Medical History:  Diagnosis Date   Anemia    Arthritis    Asthma    Breast cancer ([place])    Diabetes ([place])    Dyspnea    Dysrhythmia    afibb on occasion   Family history of adverse reaction to anesthesia    mother had reaction to versed...during surgery, stopped breathing   Fibromyalgia    GERD (gastroesophageal reflux disease)    Headache    History of hiatal hernia    Hypertension    Migraines    Osteopenia    Psoriasis    Yeast infection 10/30/2021    Past Surgical History:  Procedure Laterality Date   ABDOMINAL HYSTERECTOMY     APPENDECTOMY     BREAST LUMPECTOMY WITH RADIOACTIVE SEED AND SENTINEL LYMPH NODE BIOPSY Right 02/18/2021   Procedure: RIGHT BREAST LUMPECTOMY WITH RADIOACTIVE SEED AND SENTINEL LYMPH NODE BIOPSY;  Surgeon: [name], MD;  Location: [facility];  Service: General;  Laterality: Right;   broken wrist     CARDIOVERSION N/A 04/04/2021   Procedure: CARDIOVERSION;  Surgeon: Sueanne Margarita, MD;  Location: [facility];  Service: Cardiovascular;  Laterality: N/A;   CARDIOVERSION N/A 08/14/2021   Procedure: CARDIOVERSION;  Surgeon: Geralynn Rile, MD;  Location: [facility];  Service: Cardiovascular;  Laterality: N/A;   CARDIOVERSION N/A 09/19/2021   Procedure: CARDIOVERSION;  Surgeon:  Geralynn Rile, MD;  Location: [place];  Service: Cardiovascular;  Laterality: N/A;   [place]     right wrist   HYSTERECTOMY ABDOMINAL WITH SALPINGO-OOPHORECTOMY     LYSIS OF ADHESION     SHOULDER SURGERY Left    TUBAL LIGATION      There were no vitals filed for this visit.   Subjective Assessment - 11/27/21 1505     Subjective I am feeling better today. I am not as off balance. I did not have any muscle soreness after last session but I was tired.    Pertinent History Patient was diagnosed on 11/22/2020 with right invasive ductal carcinoma breast cancer. She underwent a right lumpectomy and sentinel node biopsy (5 negative nodes) on 02/18/2021. It is ER/PR positive and HER2 negative with a Ki67 of 10%. She has many orthopedic problems from a traumatic fall and other accidents, pt has had 3 cardioversions this year for atrial fib on 04/04/21, 08/14/21 and 09/19/21, has hx of rotator cuff surgery on the R    Patient Stated Goals I want to be safe when I walk, I have stopped doing jobs that I have to walk any distance    Currently in Pain? No/denies  Pain Score 0-No pain                               [facility] Adult PT Treatment/Exercise - 11/27/21 0001       Knee/Hip Exercises: Aerobic   Nustep 7 min on level 1 with monitoring throughout at a slow pace with no use of UEs      Knee/Hip Exercises: Standing   Hip Flexion Stengthening;Both;1 set;10 reps;Knee straight   with yellow theraband, v/c to keep knee straight   Hip ADduction Strengthening;Both;1 set;10 reps   with yellow theraband   Hip Abduction Stengthening;Both;1 set;10 reps;Knee straight   with yellow theraband, pt had difficulty coordinating this exercise   Hip Extension Stengthening;Both;1 set;10 reps;Knee straight   with yellow band, difficulty coordinating exercise     Knee/Hip Exercises: Seated   Long Arc Quad Strengthening;Both;1  set;10 reps    Marching Strengthening;Both;1 set;10 reps    Hamstring Curl Strengthening;Both;1 set;10 reps   with yellow theraband, increased difficulty on the R side   Abduction/Adduction  Both;Strengthening;1 set;10 reps   with theraband tied around knees - yellow   Sit to Sand 2 sets;5 reps;without UE support                          PT Long Term Goals - 11/26/21 0001       PT LONG TERM GOAL #1   Title Pt will demonstrate 4/5 bilateral hip flexion strength to decrease fall risk    Baseline R 3+, L 3-; 11/26/21- R 3- L 3+    Time 5    Period Weeks    Status On-going    Target Date 11/21/21      PT LONG TERM GOAL #2   Title Pt will demonstrate 4+/5 R knee flexion strength to decrease risk of falls    Baseline 3/5; 11/26/21- 3/5    Time 5    Period Weeks    Status On-going    Target Date 11/21/21      PT LONG TERM GOAL #3   Title Pt will demonstrate 4+/5 bilateral  knee extensor strength to decrease risk of falls.    Baseline 3/5 R, 4/5 L    Time 5    Period Weeks    Status On-going    Target Date 11/21/21      PT LONG TERM GOAL #4   Title Pt will report she feels safe returning to work because she is not afraid of falling    Baseline 11/26/21- pt is still fearful of falling at work    Time 5    Period Weeks    Status On-going    Target Date 11/21/21      PT LONG TERM GOAL #5   Title Pt will be indepdent in a home exercise program for continued stretching and strengthening    Time 5    Period Weeks    Status On-going    Target Date 11/21/21                   Plan - 11/27/21 1601     Clinical Impression Statement Pt did very well with exercises last session and did not have increased pain. Continued with strengthening exercises today and added new seated strengthening exercises. Began with 7 min on the Nustep on level 1 with pt going at a very slow pace and not reporting any  pain. Continued with 4 way hip strengthening. Pt has increased  difficulty with L hip abduction and difficulty coordinating the movement. Overall pt has coordination deficits in the L side which lead to losses of balance and difficulty with exercises. Pt reports she has been having difficulty with this for a while. She reports her balance is better today and she is feeling better today but pt still had several episodes of staggering during her session. Added new seated exercises today and gave pt recovery periods in between since she is in prolonged a-fib while monitoring pt throughout.    PT Frequency 2x / week    PT Duration --   5 weeks   PT Treatment/Interventions ADLs/Self Care Home Management;Therapeutic exercise;Patient/family education;Therapeutic activities;Neuromuscular re-education;Balance training;Gait training;Stair training;Manual techniques;Manual lymph drainage;Passive range of motion    PT Next Visit Plan how were new HEP exercises? **Do SOZO again when not in Afib** gentle  PROM to R shoulder, MFR to cord in R axilla if still present, try ranger maybe supine AROM due to Lt UE not able to help with AAROM  ( cont LE strengthening and high level balance exercises Rt leg is the weakest)    PT Home Exercise Plan Access Code: [email or web address]    Consulted and Agree with Plan of Care Patient             Patient will benefit from skilled therapeutic intervention in order to improve the following deficits and impairments:  Postural dysfunction, Decreased range of motion, Decreased knowledge of precautions, Impaired UE functional use, Pain, Increased fascial restricitons, Decreased strength, Decreased activity tolerance, Increased edema, Difficulty walking, Decreased balance, Decreased endurance  Visit Diagnosis: Muscle weakness (generalized)  Difficulty in walking, not elsewhere classified  Stiffness of right shoulder, not elsewhere classified  Localized edema  Abnormal posture  Malignant neoplasm of upper-outer quadrant of right breast in female,  estrogen receptor positive ([place])     Problem List Patient Active Problem List   Diagnosis Date Noted   Yeast infection 10/30/2021   Breast abscess    Sepsis ([place]) 10/12/2021   Fever 10/12/2021   Nausea 10/12/2021   Headache 10/12/2021   Generalized weakness 10/12/2021   Diabetes ([place])    Hypertension    GERD (gastroesophageal reflux disease)    Iron deficiency anemia 09/25/2021   Secondary hypercoagulable state ([place]) 07/26/2021   Persistent atrial fibrillation ([facility])    Genetic testing 12/26/2020   Malignant neoplasm of upper-outer quadrant of right breast in female, estrogen receptor positive ([place]) 11/28/2020    Allyson Sabal [place], PT 11/27/2021, 4:05 PM  [facility] @ [place], [place], [address] Phone: [phone]   Fax:  [phone]  Name: Kayla Bennett MRN: [id number] Date of Birth: [birth year]

## 2021-11-29 ENCOUNTER — Encounter: Payer: Medicare Other | Admitting: Rehabilitation

## 2021-12-02 ENCOUNTER — Encounter: Payer: Self-pay | Admitting: Physical Therapy

## 2021-12-02 ENCOUNTER — Other Ambulatory Visit: Payer: Self-pay

## 2021-12-02 ENCOUNTER — Ambulatory Visit: Payer: Medicare Other | Admitting: Physical Therapy

## 2021-12-02 DIAGNOSIS — M6281 Muscle weakness (generalized): Secondary | ICD-10-CM

## 2021-12-02 DIAGNOSIS — R600 Localized edema: Secondary | ICD-10-CM | POA: Diagnosis not present

## 2021-12-02 DIAGNOSIS — R293 Abnormal posture: Secondary | ICD-10-CM

## 2021-12-02 DIAGNOSIS — R262 Difficulty in walking, not elsewhere classified: Secondary | ICD-10-CM | POA: Diagnosis not present

## 2021-12-02 DIAGNOSIS — C50411 Malignant neoplasm of upper-outer quadrant of right female breast: Secondary | ICD-10-CM

## 2021-12-02 DIAGNOSIS — M25611 Stiffness of right shoulder, not elsewhere classified: Secondary | ICD-10-CM | POA: Diagnosis not present

## 2021-12-02 NOTE — Therapy (Signed)
[facility] @ [facility] [address] [place], [place], [address] Phone: [phone]   Fax:  [phone]  Physical Therapy Treatment  Patient Details  Name: LAELA DEVINEY MRN: [id number] Date of Birth: 08/08/1951 Referring Provider (PT): Dr. [name]   Encounter Date: 12/02/2021   PT End of Session - 12/02/21 1057     Visit Number 7    Number of Visits 13    Date for PT Re-Evaluation 12/24/21    PT Start Time 1007    PT Stop Time 1057    PT Time Calculation (min) 50 min    Activity Tolerance Patient tolerated treatment well    Behavior During Therapy [facility] for tasks assessed/performed             Past Medical History:  Diagnosis Date   Anemia    Arthritis    Asthma    Breast cancer ([place])    Diabetes ([place])    Dyspnea    Dysrhythmia    afibb on occasion   Family history of adverse reaction to anesthesia    mother had reaction to versed...during surgery, stopped breathing   Fibromyalgia    GERD (gastroesophageal reflux disease)    Headache    History of hiatal hernia    Hypertension    Migraines    Osteopenia    Psoriasis    Yeast infection 10/30/2021    Past Surgical History:  Procedure Laterality Date   ABDOMINAL HYSTERECTOMY     APPENDECTOMY     BREAST LUMPECTOMY WITH RADIOACTIVE SEED AND SENTINEL LYMPH NODE BIOPSY Right 02/18/2021   Procedure: RIGHT BREAST LUMPECTOMY WITH RADIOACTIVE SEED AND SENTINEL LYMPH NODE BIOPSY;  Surgeon: [name], MD;  Location: [facility];  Service: General;  Laterality: Right;   broken wrist     CARDIOVERSION N/A 04/04/2021   Procedure: CARDIOVERSION;  Surgeon: Sueanne Margarita, MD;  Location: [facility];  Service: Cardiovascular;  Laterality: N/A;   CARDIOVERSION N/A 08/14/2021   Procedure: CARDIOVERSION;  Surgeon: Geralynn Rile, MD;  Location: [facility];  Service: Cardiovascular;  Laterality: N/A;   CARDIOVERSION N/A 09/19/2021   Procedure: CARDIOVERSION;  Surgeon:  Geralynn Rile, MD;  Location: [facility];  Service: Cardiovascular;  Laterality: N/A;   [place]     right wrist   HYSTERECTOMY ABDOMINAL WITH SALPINGO-OOPHORECTOMY     LYSIS OF ADHESION     SHOULDER SURGERY Left    TUBAL LIGATION      There were no vitals filed for this visit.   Subjective Assessment - 12/02/21 1009     Subjective The shoulder is a little sore but not bad. It is tender underneath the arm. I was not sore after exercise last time.    Pertinent History Patient was diagnosed on 11/22/2020 with right invasive ductal carcinoma breast cancer. She underwent a right lumpectomy and sentinel node biopsy (5 negative nodes) on 02/18/2021. It is ER/PR positive and HER2 negative with a Ki67 of 10%. She has many orthopedic problems from a traumatic fall and other accidents, pt has had 3 cardioversions this year for atrial fib on 04/04/21, 08/14/21 and 09/19/21, has hx of rotator cuff surgery on the R    Patient Stated Goals I want to be safe when I walk, I have stopped doing jobs that I have to walk any distance    Currently in Pain? Yes    Pain  Score 1     Pain Location Axilla    Pain Orientation Right    Pain Descriptors / Indicators Discomfort    Pain Type Acute pain    Pain Onset More than a month ago    Pain Frequency Intermittent    Aggravating Factors  touching it, putting pressure through arm    Pain Relieving Factors nothing    Effect of Pain on Daily Activities does not change anything                               [facility] Adult PT Treatment/Exercise - 12/02/21 0001       Knee/Hip Exercises: Aerobic   Nustep 7 min on level 1 with monitoring throughout at a slow pace with no use of UEs      Knee/Hip Exercises: Standing   Hip Flexion Stengthening;Both;1 set;10 reps;Knee straight   with yellow theraband, v/c to keep knee straight   Hip ADduction Strengthening;Both;1 set;10 reps   with  yellow theraband   Hip Abduction Stengthening;Both;1 set;10 reps;Knee straight   with yellow theraband, pt had difficulty coordinating this exercise   Hip Extension Stengthening;Both;1 set;10 reps;Knee straight   with yellow band, difficulty coordinating exercise     Shoulder Exercises: Pulleys   Flexion 2 minutes    ABduction 2 minutes   with only R shoulder in abduction to due limitations of L shoulder     Shoulder Exercises: Therapy Ball   Flexion 5 reps;Right   uisng left to assist until 90 degrees but pt unable to lift higher - stopped at 5 reps due to pt fatigue and unsteadiness     Manual Therapy   Soft tissue mobilization in L S/L to R lats and serratus with very light pressure to begin with due to pt's increased sensitivity and tendernes but this improved by the end of the treatment session                          PT Long Term Goals - 11/26/21 0001       PT LONG TERM GOAL #1   Title Pt will demonstrate 4/5 bilateral hip flexion strength to decrease fall risk    Baseline R 3+, L 3-; 11/26/21- R 3- L 3+    Time 5    Period Weeks    Status On-going    Target Date 11/21/21      PT LONG TERM GOAL #2   Title Pt will demonstrate 4+/5 R knee flexion strength to decrease risk of falls    Baseline 3/5; 11/26/21- 3/5    Time 5    Period Weeks    Status On-going    Target Date 11/21/21      PT LONG TERM GOAL #3   Title Pt will demonstrate 4+/5 bilateral  knee extensor strength to decrease risk of falls.    Baseline 3/5 R, 4/5 L    Time 5    Period Weeks    Status On-going    Target Date 11/21/21      PT LONG TERM GOAL #4   Title Pt will report she feels safe returning to work because she is not afraid of falling    Baseline 11/26/21- pt is still fearful of falling at work    Time 5    Period Weeks    Status On-going    Target Date 11/21/21  PT LONG TERM GOAL #5   Title Pt will be indepdent in a home exercise program for continued stretching and  strengthening    Time 5    Period Weeks    Status On-going    Target Date 11/21/21                   Plan - 12/02/21 1100     Clinical Impression Statement Pt reports she did not have any increased pain after exercises last session. She is having some discomfort in her R axilla especially to touch. She has increased discomfort and sensitivity at her lats and serratus. After strengthening exercises today for LEs and AAROM exercises to help stretch R UE focused on soft tissue mobilization to this area. Pt could barely stand any pressure in the begining of the session but by the end was able to tolerate medium pressure. The area is very tense with trigger points noted inferiorly.    PT Frequency 2x / week    PT Duration --   5 weeks   PT Treatment/Interventions ADLs/Self Care Home Management;Therapeutic exercise;Patient/family education;Therapeutic activities;Neuromuscular re-education;Balance training;Gait training;Stair training;Manual techniques;Manual lymph drainage;Passive range of motion    PT Next Visit Plan how were new HEP exercises? **Do SOZO again when not in Afib** gentle  PROM to R shoulder, MFR to cord in R axilla if still present, try ranger maybe supine AROM due to Lt UE not able to help with AAROM  ( cont LE strengthening and high level balance exercises Rt leg is the weakest)    PT Home Exercise Plan Access Code: [email or web address]    Consulted and Agree with Plan of Care Patient             Patient will benefit from skilled therapeutic intervention in order to improve the following deficits and impairments:  Postural dysfunction, Decreased range of motion, Decreased knowledge of precautions, Impaired UE functional use, Pain, Increased fascial restricitons, Decreased strength, Decreased activity tolerance, Increased edema, Difficulty walking, Decreased balance, Decreased endurance  Visit Diagnosis: Muscle weakness (generalized)  Difficulty in walking, not elsewhere  classified  Stiffness of right shoulder, not elsewhere classified  Localized edema  Abnormal posture  Malignant neoplasm of upper-outer quadrant of right breast in female, estrogen receptor positive ([place])     Problem List Patient Active Problem List   Diagnosis Date Noted   Yeast infection 10/30/2021   Breast abscess    Sepsis ([place]) 10/12/2021   Fever 10/12/2021   Nausea 10/12/2021   Headache 10/12/2021   Generalized weakness 10/12/2021   Diabetes ([place])    Hypertension    GERD (gastroesophageal reflux disease)    Iron deficiency anemia 09/25/2021   Secondary hypercoagulable state ([place]) 07/26/2021   Persistent atrial fibrillation (H[facility])    Genetic testing 12/26/2020   Malignant neoplasm of upper-outer quadrant of right breast in female, estrogen receptor positive ([place]) 11/28/2020    Allyson Sabal [place], PT 12/02/2021, 11:02 AM  [facility] @ [facility], [place], [address] Phone: [phone]   Fax:  [phone]  Name: RAMESHA POSTER MRN: [id number] Date of Birth: [birth year]

## 2021-12-03 ENCOUNTER — Telehealth (HOSPITAL_COMMUNITY): Payer: Self-pay | Admitting: Emergency Medicine

## 2021-12-03 ENCOUNTER — Ambulatory Visit (HOSPITAL_COMMUNITY): Payer: Medicare Other

## 2021-12-03 NOTE — Telephone Encounter (Signed)
Attempted to call patient regarding upcoming cardiac CT appointment. °Left message on voicemail with name and callback number °Ariah Mower RN Navigator Cardiac Imaging °[facility] °[phone] Office °[phone] Cell ° °

## 2021-12-03 NOTE — Telephone Encounter (Signed)
Reaching out to patient to offer assistance regarding upcoming cardiac imaging study; pt verbalizes understanding of appt date/time, parking situation and where to check in, pre-test NPO status and medications ordered, and verified current allergies; name and call back number provided for further questions should they arise Kayla Bond RN Navigator Cardiac Imaging [facility] [phone] office [phone] cell  Arrival 1:00p  Taking 50mg  metoprolol succ 2 hr prior to scan Holding losartan-HCTZ

## 2021-12-04 ENCOUNTER — Ambulatory Visit: Payer: Medicare Other | Admitting: Physical Therapy

## 2021-12-04 ENCOUNTER — Other Ambulatory Visit: Payer: Self-pay

## 2021-12-04 ENCOUNTER — Ambulatory Visit (HOSPITAL_COMMUNITY)
Admission: RE | Admit: 2021-12-04 | Discharge: 2021-12-04 | Disposition: A | Discharge status: Home / Self Care | Payer: Medicare Other | Source: Ambulatory Visit | Attending: Cardiology | Admitting: Cardiology

## 2021-12-04 ENCOUNTER — Encounter: Payer: Self-pay | Admitting: Physical Therapy

## 2021-12-04 DIAGNOSIS — M25611 Stiffness of right shoulder, not elsewhere classified: Secondary | ICD-10-CM

## 2021-12-04 DIAGNOSIS — R293 Abnormal posture: Secondary | ICD-10-CM | POA: Diagnosis not present

## 2021-12-04 DIAGNOSIS — R262 Difficulty in walking, not elsewhere classified: Secondary | ICD-10-CM

## 2021-12-04 DIAGNOSIS — M6281 Muscle weakness (generalized): Secondary | ICD-10-CM | POA: Insufficient documentation

## 2021-12-04 DIAGNOSIS — C50411 Malignant neoplasm of upper-outer quadrant of right female breast: Secondary | ICD-10-CM

## 2021-12-04 DIAGNOSIS — R600 Localized edema: Secondary | ICD-10-CM | POA: Diagnosis not present

## 2021-12-04 DIAGNOSIS — I4819 Other persistent atrial fibrillation: Secondary | ICD-10-CM | POA: Diagnosis not present

## 2021-12-04 DIAGNOSIS — Z170 Estrogen receptor positive status [ER+]: Secondary | ICD-10-CM

## 2021-12-04 MED ORDER — IOHEXOL 350 MG/ML SOLN
95.0000 mL | Freq: Once | INTRAVENOUS | Status: AC | PRN
  Administered 2021-12-04: 95 mL via INTRAVENOUS

## 2021-12-04 NOTE — Therapy (Signed)
[facility] @ [place], [place], [address] Phone: [phone]   Fax:  [phone]  Physical Therapy Treatment  Patient Details  Name: Kayla Bennett MRN: [id number] Date of Birth: Apr 08, 1951 Referring Provider (PT): Dr. [name]   Encounter Date: 12/04/2021   PT End of Session - 12/04/21 1058     Visit Number 8    Number of Visits 13    Date for PT Re-Evaluation 12/24/21    PT Start Time 6283    PT Stop Time 1058    PT Time Calculation (min) 56 min    Activity Tolerance Patient tolerated treatment well    Behavior During Therapy [employer] for tasks assessed/performed             Past Medical History:  Diagnosis Date   Anemia    Arthritis    Asthma    Breast cancer ([place])    Diabetes ([place])    Dyspnea    Dysrhythmia    afibb on occasion   Family history of adverse reaction to anesthesia    mother had reaction to versed...during surgery, stopped breathing   Fibromyalgia    GERD (gastroesophageal reflux disease)    Headache    History of hiatal hernia    Hypertension    Migraines    Osteopenia    Psoriasis    Yeast infection 10/30/2021    Past Surgical History:  Procedure Laterality Date   ABDOMINAL HYSTERECTOMY     APPENDECTOMY     BREAST LUMPECTOMY WITH RADIOACTIVE SEED AND SENTINEL LYMPH NODE BIOPSY Right 02/18/2021   Procedure: RIGHT BREAST LUMPECTOMY WITH RADIOACTIVE SEED AND SENTINEL LYMPH NODE BIOPSY;  Surgeon: [name], MD;  Location: [facility];  Service: General;  Laterality: Right;   broken wrist     CARDIOVERSION N/A 04/04/2021   Procedure: CARDIOVERSION;  Surgeon: Sueanne Margarita, MD;  Location: [facility];  Service: Cardiovascular;  Laterality: N/A;   CARDIOVERSION N/A 08/14/2021   Procedure: CARDIOVERSION;  Surgeon: Geralynn Rile, MD;  Location: [facility];  Service: Cardiovascular;  Laterality: N/A;   CARDIOVERSION N/A 09/19/2021   Procedure: CARDIOVERSION;  Surgeon:  Geralynn Rile, MD;  Location: [facility];  Service: Cardiovascular;  Laterality: N/A;   [place]     right wrist   HYSTERECTOMY ABDOMINAL WITH SALPINGO-OOPHORECTOMY     LYSIS OF ADHESION     SHOULDER SURGERY Left    TUBAL LIGATION      There were no vitals filed for this visit.   Subjective Assessment - 12/04/21 1002     Subjective I can use my arm pretty good after the massage. I did not have any soreness after last session.    Pertinent History Patient was diagnosed on 11/22/2020 with right invasive ductal carcinoma breast cancer. She underwent a right lumpectomy and sentinel node biopsy (5 negative nodes) on 02/18/2021. It is ER/PR positive and HER2 negative with a Ki67 of 10%. She has many orthopedic problems from a traumatic fall and other accidents, pt has had 3 cardioversions this year for atrial fib on 04/04/21, 08/14/21 and 09/19/21, has hx of rotator cuff surgery on the R    Patient Stated Goals I want to be safe when I walk, I have stopped doing jobs that I have to walk any distance    Currently in Pain? No/denies    Pain Score 0-No pain  [facility] Adult PT Treatment/Exercise - 12/04/21 0001       Knee/Hip Exercises: Aerobic   Nustep 8 min on level 1 with monitoring throughout at a slow pace with no use of UEs      Knee/Hip Exercises: Standing   Hip Flexion Stengthening;Both;1 set;10 reps;Knee straight   with yellow theraband, v/c to keep knee straight   Hip ADduction Strengthening;Both;1 set;10 reps   with yellow theraband   Hip Abduction Stengthening;Both;1 set;10 reps;Knee straight   with yellow theraband, pt had difficulty coordinating this exercise   Hip Extension Stengthening;Both;1 set;10 reps;Knee straight   with yellow band, difficulty coordinating exercise     Knee/Hip Exercises: Seated   Long Arc Quad Strengthening;Both;10 reps;2 sets    Marching  Strengthening;Both;10 reps;2 sets   increased difficulty on L side   Sit to Sand without UE support;10 reps;1 set      Manual Therapy   Soft tissue mobilization in L S/L to R lats and serratus with very light pressure to begin with due to pt's increased sensitivity and tendernes but this improved by the end of the treatment session, then in supine to serratus where pt had a spasm which remained very tender at end of session - will focus on this next session                          PT Long Term Goals - 11/26/21 0001       PT LONG TERM GOAL #1   Title Pt will demonstrate 4/5 bilateral hip flexion strength to decrease fall risk    Baseline R 3+, L 3-; 11/26/21- R 3- L 3+    Time 5    Period Weeks    Status On-going    Target Date 11/21/21      PT LONG TERM GOAL #2   Title Pt will demonstrate 4+/5 R knee flexion strength to decrease risk of falls    Baseline 3/5; 11/26/21- 3/5    Time 5    Period Weeks    Status On-going    Target Date 11/21/21      PT LONG TERM GOAL #3   Title Pt will demonstrate 4+/5 bilateral  knee extensor strength to decrease risk of falls.    Baseline 3/5 R, 4/5 L    Time 5    Period Weeks    Status On-going    Target Date 11/21/21      PT LONG TERM GOAL #4   Title Pt will report she feels safe returning to work because she is not afraid of falling    Baseline 11/26/21- pt is still fearful of falling at work    Time 5    Period Weeks    Status On-going    Target Date 11/21/21      PT LONG TERM GOAL #5   Title Pt will be indepdent in a home exercise program for continued stretching and strengthening    Time 5    Period Weeks    Status On-going    Target Date 11/21/21                   Plan - 12/04/21 1101     Clinical Impression Statement Pt demonstrated increased difficulty with coordination of exercises especially on the L side with standing hip abduction, standing hip extension and seated hip flexion. Educated pt to  focus on these at home standing at the counter for support with  no resistance. She had a loss of balance in the // bars during standing hip exercises but was able to self correct. Pt still has numerous trigger points in right lat and serratus. Serratus was especially tight today. Lat was improved from last session. Next session with focus more on serratus tightness.    PT Frequency 2x / week    PT Duration --   5 weeks   PT Treatment/Interventions ADLs/Self Care Home Management;Therapeutic exercise;Patient/family education;Therapeutic activities;Neuromuscular re-education;Balance training;Gait training;Stair training;Manual techniques;Manual lymph drainage;Passive range of motion    PT Next Visit Plan how were new HEP exercises? **Do SOZO again when not in Afib** gentle  PROM to R shoulder, MFR to cord in R axilla if still present, try ranger maybe supine AROM due to Lt UE not able to help with AAROM  ( cont LE strengthening and high level balance exercises Rt leg is the weakest)    PT Home Exercise Plan Access Code: [email or web address]    Consulted and Agree with Plan of Care Patient             Patient will benefit from skilled therapeutic intervention in order to improve the following deficits and impairments:  Postural dysfunction, Decreased range of motion, Decreased knowledge of precautions, Impaired UE functional use, Pain, Increased fascial restricitons, Decreased strength, Decreased activity tolerance, Increased edema, Difficulty walking, Decreased balance, Decreased endurance  Visit Diagnosis: Muscle weakness (generalized)  Difficulty in walking, not elsewhere classified  Stiffness of right shoulder, not elsewhere classified  Localized edema  Abnormal posture  Malignant neoplasm of upper-outer quadrant of right breast in female, estrogen receptor positive ([place])     Problem List Patient Active Problem List   Diagnosis Date Noted   Yeast infection 10/30/2021   Breast abscess     Sepsis ([place]) 10/12/2021   Fever 10/12/2021   Nausea 10/12/2021   Headache 10/12/2021   Generalized weakness 10/12/2021   Diabetes ([place])    Hypertension    GERD (gastroesophageal reflux disease)    Iron deficiency anemia 09/25/2021   Secondary hypercoagulable state ([place]) 07/26/2021   Persistent atrial fibrillation ([facility])    Genetic testing 12/26/2020   Malignant neoplasm of upper-outer quadrant of right breast in female, estrogen receptor positive ([place]) 11/28/2020    Allyson Sabal [place], PT 12/04/2021, 11:03 AM  [facility] @ [place], [place], [address] Phone: [phone]   Fax:  [phone]  Name: BAO COREAS MRN: [id number] Date of Birth: 08-16-1951

## 2021-12-06 ENCOUNTER — Encounter: Payer: Self-pay | Admitting: Cardiology

## 2021-12-06 NOTE — Telephone Encounter (Signed)
error 

## 2021-12-09 ENCOUNTER — Encounter: Payer: Self-pay | Admitting: Family

## 2021-12-09 ENCOUNTER — Ambulatory Visit (INDEPENDENT_AMBULATORY_CARE_PROVIDER_SITE_OTHER): Payer: Medicare Other | Admitting: Family

## 2021-12-09 ENCOUNTER — Other Ambulatory Visit: Payer: Self-pay

## 2021-12-09 VITALS — BP 124/82 | HR 87 | Resp 16 | Ht 62.0 in | Wt 197.2 lb

## 2021-12-09 DIAGNOSIS — N611 Abscess of the breast and nipple: Secondary | ICD-10-CM | POA: Diagnosis not present

## 2021-12-09 NOTE — Assessment & Plan Note (Signed)
Kayla Bennett has had no to mild improvement with antibiotic treatments thus far and continues to have tenderness. With no systemic symptoms will stop antibiotics and re-image the abscess to evaluate for any improvement. It remains unclear whether these abscesses/cyst are truly infectious versus seroma or other etiologies. May require evaluation pending imaging results. She will keep the antibiotics on hand should she experience any signs of infection. Plan for follow up pending imaging results.

## 2021-12-09 NOTE — Patient Instructions (Signed)
Nice to see you.  Stop taking your antibiotics.   Monitor your symptoms watch for fevers, redness, swelling, or increased pain without cause.   We will image to check current status.  Have a great day and stay safe!

## 2021-12-09 NOTE — Progress Notes (Signed)
Subjective:    Patient ID: Kayla Bennett, female    DOB: Aug 31, 1951, 71 y.o.   MRN: [id number]  Chief Complaint  Patient presents with   Follow-up    HPI:  Kayla Bennett is a 71 y.o. female with suspected infected breast cyst was last seen on 11/14/21 follow aspiration which no sample was able to be obtained. Continued on cefdinir and doxycycline for diagnostic and therapeutic purpose. Confirmed with [facility] today no specimen was able to be obtained. Scheduled for atrial fibrillation ablation tomorrow and here for follow up today.  Kayla Bennett has been taking her cefdinir and doxycycline as prescribed with no adverse side effects. Overall feeling about the same since previous office visit with increased range of motion to her right upper extremity. Denies any fevers, chills, or sweats.    Allergies  Allergen Reactions   Lotensin [Benazepril Hcl] Swelling   Morphine Itching and Rash   Erythromycin Base Other (See Comments)    Unknown reaction   Hydrocodone Other (See Comments)    Unknown reaction     Other Swelling    Mustard - vaginal swelling/pain    Banana Swelling    vaginal swelling/pain   Chocolate Swelling    vaginal swelling/pain   Gluten Meal Diarrhea    Upset stomach    Tizanidine Other (See Comments)    Lowered blood pressure    Wound Dressing Adhesive Other (See Comments)    Skin burns, blisters       Outpatient Medications Prior to Visit  Medication Sig Dispense Refill   acetaminophen (TYLENOL) 500 MG tablet Take 500 mg by mouth every 6 (six) hours as needed for moderate pain, headache or mild pain.     albuterol (VENTOLIN HFA) 108 (90 Base) MCG/ACT inhaler Inhale 1-2 puffs into the lungs every 6 (six) hours as needed for shortness of breath or wheezing (Asthma).     apixaban (ELIQUIS) 5 MG TABS tablet Take 1 tablet (5 mg total) by mouth 2 (two) times daily. Pt needs 90 day supply.  Please keep on file till next refill. 180 tablet 3    calcipotriene (DOVONOX) 0.005 % cream Apply 1 application topically See admin instructions. Mix with fluorouracil 5% cream and apply topically twice daily for five days - as needed for precancerous on right cheek.     cefdinir (OMNICEF) 300 MG capsule Take 1 capsule (300 mg total) by mouth 2 (two) times daily. 60 capsule 0   cetirizine (ZYRTEC) 10 MG tablet Take 10 mg by mouth in the morning.     cholecalciferol (VITAMIN D3) 25 MCG (1000 UNIT) tablet Take 1,000-2,000 Units by mouth See admin instructions. Take 1000 units in the morning and 2000 units at bedtime     doxycycline (VIBRA-TABS) 100 MG tablet Take 1 tablet (100 mg total) by mouth 2 (two) times daily. 60 tablet 0   ferrous sulfate 325 (65 FE) MG tablet Take 325 mg by mouth every morning.     fluconazole (DIFLUCAN) 150 MG tablet Take 1 tablet by mouth every 72 hours as needed. 5 tablet 0   fluorouracil (EFUDEX) 5 % cream Apply 1 application topically 2 (two) times daily as needed (precancerous spots on right cheek). Mix with calcipotriene 0.005% cream and apply topically twice daily for five days - as needed for precancerous on right cheek.     gabapentin (NEURONTIN) 100 MG capsule Take 200 mg by mouth at bedtime.     losartan-hydrochlorothiazide (HYZAAR) 100-25 MG tablet Take  1 tablet by mouth daily. 30 tablet 3   metFORMIN (GLUCOPHAGE-XR) 500 MG 24 hr tablet Take 500 mg by mouth at bedtime.     metoprolol succinate (TOPROL XL) 25 MG 24 hr tablet Take 1 tablet (25 mg total) by mouth daily.     pantoprazole (PROTONIX) 40 MG tablet Take 40 mg by mouth 2 (two) times daily before a meal.     potassium chloride SA (KLOR-CON M20) 20 MEQ tablet Take 1 tablet (20 mEq total) by mouth daily. 30 tablet 6   Prenatal Vit-Fe Fumarate-FA (PRENATAL VITAMIN PLUS LOW IRON PO) Take 1 tablet by mouth in the morning.     SUMAtriptan (IMITREX) 100 MG tablet Take 100 mg by mouth See admin instructions. Take one tablet (100 mg) by mouth at onset of migraine  headache, may repeat in 2 hours if headache persists or recurs.     tamoxifen (NOLVADEX) 20 MG tablet TAKE 1 TABLET(20 MG) BY MOUTH DAILY (Patient taking differently: Take 20 mg by mouth every morning.) 30 tablet 3   triamcinolone (KENALOG) 0.1 % Apply 1 application topically 2 (two) times daily as needed (psoriasis). Mixed with Cetaphil 1:1     venlafaxine XR (EFFEXOR-XR) 37.5 MG 24 hr capsule Take 37.5 mg by mouth at bedtime. Take 1 capsule (75 mg) + 1 capsule (37.5 mg) to equal 112.5 mg at bedtime     venlafaxine XR (EFFEXOR-XR) 75 MG 24 hr capsule Take 75 mg by mouth at bedtime. Take 1 capsule (75 mg) + 1 capsule (37.5 mg) to equal 112.5 mg  at bedtime     vitamin B-12 (CYANOCOBALAMIN) 1000 MCG tablet Take 1,000 mcg by mouth every morning.     vitamin C (ASCORBIC ACID) 500 MG tablet Take 500 mg by mouth daily.     No facility-administered medications prior to visit.     Past Medical History:  Diagnosis Date   Anemia    Arthritis    Asthma    Breast cancer ([place])    Diabetes ([place])    Dyspnea    Dysrhythmia    afibb on occasion   Family history of adverse reaction to anesthesia    mother had reaction to versed...during surgery, stopped breathing   Fibromyalgia    GERD (gastroesophageal reflux disease)    Headache    History of hiatal hernia    Hypertension    Migraines    Osteopenia    Psoriasis    Yeast infection 10/30/2021     Past Surgical History:  Procedure Laterality Date   ABDOMINAL HYSTERECTOMY     APPENDECTOMY     BREAST LUMPECTOMY WITH RADIOACTIVE SEED AND SENTINEL LYMPH NODE BIOPSY Right 02/18/2021   Procedure: RIGHT BREAST LUMPECTOMY WITH RADIOACTIVE SEED AND SENTINEL LYMPH NODE BIOPSY;  Surgeon: [name], MD;  Location: [place];  Service: General;  Laterality: Right;   broken wrist     CARDIOVERSION N/A 04/04/2021   Procedure: CARDIOVERSION;  Surgeon: Sueanne Margarita, MD;  Location: [place] [facility];  Service: Cardiovascular;  Laterality: N/A;   CARDIOVERSION  N/A 08/14/2021   Procedure: CARDIOVERSION;  Surgeon: Geralynn Rile, MD;  Location: [place];  Service: Cardiovascular;  Laterality: N/A;   CARDIOVERSION N/A 09/19/2021   Procedure: CARDIOVERSION;  Surgeon: Geralynn Rile, MD;  Location: [place];  Service: Cardiovascular;  Laterality: N/A;   [place]     right wrist   HYSTERECTOMY ABDOMINAL WITH SALPINGO-OOPHORECTOMY  LYSIS OF ADHESION     SHOULDER SURGERY Left    TUBAL LIGATION         Review of Systems  Constitutional:  Negative for chills, diaphoresis, fatigue and fever.  Respiratory:  Negative for cough, chest tightness, shortness of breath and wheezing.   Cardiovascular:  Positive for chest pain (Axillary pain).  Gastrointestinal:  Negative for abdominal pain, diarrhea, nausea and vomiting.     Objective:    BP 124/82    Pulse 87    Resp 16    Ht 5\' 2"  (1.575 m)    Wt 197 lb 3.2 oz (89.4 kg)    SpO2 94%    BMI 36.07 kg/m  Nursing note and vital signs reviewed.  Physical Exam Constitutional:      General: She is not in acute distress.    Appearance: She is well-developed.  Cardiovascular:     Rate and Rhythm: Normal rate. Rhythm irregular.     Heart sounds: Normal heart sounds.  Pulmonary:     Effort: Pulmonary effort is normal.     Breath sounds: Normal breath sounds.  Skin:    General: Skin is warm and dry.  Neurological:     Mental Status: She is alert and oriented to person, place, and time.  Psychiatric:        Mood and Affect: Mood normal.     Depression screen [facility] 2/9 12/09/2021 11/14/2021 10/30/2021 10/23/2021  Decreased Interest 0 0 0 0  Down, Depressed, Hopeless 0 0 0 0  PHQ - 2 Score 0 0 0 0       Assessment & Plan:    Patient Active Problem List   Diagnosis Date Noted   Yeast infection 10/30/2021   Breast abscess    Sepsis ([place]) 10/12/2021   Fever 10/12/2021   Nausea 10/12/2021   Headache 10/12/2021    Generalized weakness 10/12/2021   Diabetes ([place])    Hypertension    GERD (gastroesophageal reflux disease)    Iron deficiency anemia 09/25/2021   Secondary hypercoagulable state ([place]) 07/26/2021   Persistent atrial fibrillation ([place])    Genetic testing 12/26/2020   Malignant neoplasm of upper-outer quadrant of right breast in female, estrogen receptor positive ([place]) 11/28/2020     Problem List Items Addressed This Visit       Other   Breast abscess - Primary    Kayla Bennett has had no to mild improvement with antibiotic treatments thus far and continues to have tenderness. With no systemic symptoms will stop antibiotics and re-image the abscess to evaluate for any improvement. It remains unclear whether these abscesses/cyst are truly infectious versus seroma or other etiologies. May require evaluation pending imaging results. She will keep the antibiotics on hand should she experience any signs of infection. Plan for follow up pending imaging results.       Relevant Orders   MM Digital Diagnostic Unilat R   US BREAST COMPLETE UNI RIGHT INC AXILLA     I am having Kessie P. Bednarz maintain her metFORMIN, cetirizine, pantoprazole, gabapentin, venlafaxine XR, cholecalciferol, Prenatal Vit-Fe Fumarate-FA (PRENATAL VITAMIN PLUS LOW IRON PO), triamcinolone cream, albuterol, venlafaxine XR, vitamin C, SUMAtriptan, apixaban, potassium chloride SA, metoprolol succinate, ferrous sulfate, vitamin B-12, fluorouracil, tamoxifen, acetaminophen, losartan-hydrochlorothiazide, calcipotriene, fluconazole, doxycycline, and cefdinir.   Follow-up: Pending imaging results as needed.    Terri Piedra, MSN, FNP-C Nurse Practitioner [facility] [place] number: [phone]

## 2021-12-10 ENCOUNTER — Ambulatory Visit (HOSPITAL_COMMUNITY): Payer: Medicare Other | Admitting: Certified Registered"

## 2021-12-10 ENCOUNTER — Encounter (HOSPITAL_COMMUNITY)
Admission: RE | Disposition: A | Discharge status: Home / Self Care | Payer: Medicare Other | Source: Home / Self Care | Attending: Cardiology

## 2021-12-10 ENCOUNTER — Ambulatory Visit (HOSPITAL_COMMUNITY)
Admission: RE | Admit: 2021-12-10 | Discharge: 2021-12-10 | Disposition: A | Discharge status: Home / Self Care | Payer: Medicare Other | Attending: Cardiology | Admitting: Cardiology

## 2021-12-10 DIAGNOSIS — Z7901 Long term (current) use of anticoagulants: Secondary | ICD-10-CM | POA: Diagnosis not present

## 2021-12-10 DIAGNOSIS — I4819 Other persistent atrial fibrillation: Secondary | ICD-10-CM | POA: Diagnosis present

## 2021-12-10 DIAGNOSIS — E119 Type 2 diabetes mellitus without complications: Secondary | ICD-10-CM | POA: Insufficient documentation

## 2021-12-10 DIAGNOSIS — I10 Essential (primary) hypertension: Secondary | ICD-10-CM | POA: Diagnosis not present

## 2021-12-10 DIAGNOSIS — M797 Fibromyalgia: Secondary | ICD-10-CM | POA: Insufficient documentation

## 2021-12-10 DIAGNOSIS — Z853 Personal history of malignant neoplasm of breast: Secondary | ICD-10-CM | POA: Insufficient documentation

## 2021-12-10 DIAGNOSIS — Z7984 Long term (current) use of oral hypoglycemic drugs: Secondary | ICD-10-CM | POA: Diagnosis not present

## 2021-12-10 DIAGNOSIS — I4891 Unspecified atrial fibrillation: Secondary | ICD-10-CM | POA: Diagnosis not present

## 2021-12-10 HISTORY — PX: ATRIAL FIBRILLATION ABLATION: EP1191

## 2021-12-10 LAB — POCT ACTIVATED CLOTTING TIME
Activated Clotting Time: 317 seconds
Activated Clotting Time: 353 seconds
Activated Clotting Time: 353 seconds
Activated Clotting Time: 335 seconds

## 2021-12-10 LAB — GLUCOSE, CAPILLARY
Glucose-Capillary: 139 mg/dL — ABNORMAL HIGH (ref 70–99)
Glucose-Capillary: 137 mg/dL — ABNORMAL HIGH (ref 70–99)

## 2021-12-10 SURGERY — ATRIAL FIBRILLATION ABLATION
Anesthesia: General

## 2021-12-10 MED ORDER — LIDOCAINE HCL (PF) 1 % IJ SOLN
INTRAMUSCULAR | Status: AC
  Filled 2021-12-10: qty 30

## 2021-12-10 MED ORDER — SODIUM CHLORIDE 0.9% FLUSH: 3.0000 mL | INTRAVENOUS | Status: DC | PRN

## 2021-12-10 MED ORDER — ACETAMINOPHEN 325 MG PO TABS: 650.0000 mg | ORAL_TABLET | ORAL | Status: DC | PRN

## 2021-12-10 MED ORDER — SODIUM CHLORIDE 0.9% FLUSH: 3.0000 mL | Freq: Two times a day (BID) | INTRAVENOUS | Status: DC

## 2021-12-10 MED ORDER — COLCHICINE 0.6 MG PO TABS: 0.6000 mg | ORAL_TABLET | Freq: Two times a day (BID) | ORAL | 0 refills | Status: DC

## 2021-12-10 MED ORDER — COLCHICINE 0.6 MG PO TABS
0.6000 mg | ORAL_TABLET | ORAL | Status: AC
  Administered 2021-12-10: 15:00:00 0.6 mg via ORAL
  Filled 2021-12-10: qty 1

## 2021-12-10 MED ORDER — HEPARIN (PORCINE) IN NACL 1000-0.9 UT/500ML-% IV SOLN
INTRAVENOUS | Status: DC | PRN
  Administered 2021-12-10 (×5): 500 mL

## 2021-12-10 MED ORDER — SODIUM CHLORIDE 0.9 % IV SOLN: 250.0000 mL | INTRAVENOUS | Status: DC | PRN

## 2021-12-10 MED ORDER — DEXAMETHASONE SODIUM PHOSPHATE 10 MG/ML IJ SOLN
INTRAMUSCULAR | Status: DC | PRN
  Administered 2021-12-10: 10 mg via INTRAVENOUS

## 2021-12-10 MED ORDER — LIDOCAINE 2% (20 MG/ML) 5 ML SYRINGE
INTRAMUSCULAR | Status: DC | PRN
  Administered 2021-12-10: 60 mg via INTRAVENOUS

## 2021-12-10 MED ORDER — SODIUM CHLORIDE 0.9 % IV SOLN: INTRAVENOUS | Status: DC

## 2021-12-10 MED ORDER — LIDOCAINE HCL (PF) 1 % IJ SOLN
INTRAMUSCULAR | Status: DC | PRN
  Administered 2021-12-10: 40 mL

## 2021-12-10 MED ORDER — HEPARIN (PORCINE) IN NACL 1000-0.9 UT/500ML-% IV SOLN
INTRAVENOUS | Status: AC
  Filled 2021-12-10: qty 2500

## 2021-12-10 MED ORDER — DOBUTAMINE INFUSION FOR EP/ECHO/NUC (1000 MCG/ML)
INTRAVENOUS | Status: AC
  Filled 2021-12-10: qty 250

## 2021-12-10 MED ORDER — HEPARIN SODIUM (PORCINE) 1000 UNIT/ML IJ SOLN
INTRAMUSCULAR | Status: AC
  Filled 2021-12-10: qty 10

## 2021-12-10 MED ORDER — SUGAMMADEX SODIUM 200 MG/2ML IV SOLN
INTRAVENOUS | Status: DC | PRN
  Administered 2021-12-10: 50 mg via INTRAVENOUS
  Administered 2021-12-10: 150 mg via INTRAVENOUS

## 2021-12-10 MED ORDER — EPHEDRINE SULFATE-NACL 50-0.9 MG/10ML-% IV SOSY
PREFILLED_SYRINGE | INTRAVENOUS | Status: DC | PRN
  Administered 2021-12-10: 5 mg via INTRAVENOUS

## 2021-12-10 MED ORDER — PROPOFOL 10 MG/ML IV BOLUS
INTRAVENOUS | Status: DC | PRN
  Administered 2021-12-10: 120 mg via INTRAVENOUS

## 2021-12-10 MED ORDER — FENTANYL CITRATE (PF) 250 MCG/5ML IJ SOLN
INTRAMUSCULAR | Status: DC | PRN
  Administered 2021-12-10: 50 ug via INTRAVENOUS

## 2021-12-10 MED ORDER — ONDANSETRON HCL 4 MG/2ML IJ SOLN: 4.0000 mg | Freq: Four times a day (QID) | INTRAMUSCULAR | Status: DC | PRN

## 2021-12-10 MED ORDER — ACETAMINOPHEN 500 MG PO TABS
1000.0000 mg | ORAL_TABLET | Freq: Once | ORAL | Status: DC
  Filled 2021-12-10: qty 2

## 2021-12-10 MED ORDER — HEPARIN SODIUM (PORCINE) 1000 UNIT/ML IJ SOLN
INTRAMUSCULAR | Status: DC | PRN
  Administered 2021-12-10: 1000 [IU] via INTRAVENOUS

## 2021-12-10 MED ORDER — ROCURONIUM BROMIDE 10 MG/ML (PF) SYRINGE
PREFILLED_SYRINGE | INTRAVENOUS | Status: DC | PRN
  Administered 2021-12-10: 60 mg via INTRAVENOUS

## 2021-12-10 MED ORDER — PROTAMINE SULFATE 10 MG/ML IV SOLN
INTRAVENOUS | Status: DC | PRN
  Administered 2021-12-10: 10 mg via INTRAVENOUS
  Administered 2021-12-10: 30 mg via INTRAVENOUS

## 2021-12-10 MED ORDER — DOBUTAMINE INFUSION FOR EP/ECHO/NUC (1000 MCG/ML)
INTRAVENOUS | Status: DC | PRN
  Administered 2021-12-10: 20 ug/kg/min via INTRAVENOUS

## 2021-12-10 MED ORDER — PHENYLEPHRINE 40 MCG/ML (10ML) SYRINGE FOR IV PUSH (FOR BLOOD PRESSURE SUPPORT)
PREFILLED_SYRINGE | INTRAVENOUS | Status: DC | PRN
  Administered 2021-12-10: 80 ug via INTRAVENOUS

## 2021-12-10 MED ORDER — HEPARIN SODIUM (PORCINE) 1000 UNIT/ML IJ SOLN
INTRAMUSCULAR | Status: DC | PRN
  Administered 2021-12-10: 14000 [IU] via INTRAVENOUS
  Administered 2021-12-10 (×2): 1000 [IU] via INTRAVENOUS
  Administered 2021-12-10: 2000 [IU] via INTRAVENOUS

## 2021-12-10 MED ORDER — ONDANSETRON HCL 4 MG/2ML IJ SOLN
INTRAMUSCULAR | Status: DC | PRN
Start: 2021-12-10 — End: 2021-12-10
  Administered 2021-12-10: 4 mg via INTRAVENOUS

## 2021-12-10 SURGICAL SUPPLY — 22 items
BAG SNAP BAND KOVER 36X36 (MISCELLANEOUS) ×2 IMPLANT
BLANKET WARM UNDERBOD FULL ACC (MISCELLANEOUS) ×3 IMPLANT
CATH 8FR REPROCESSED SOUNDSTAR (CATHETERS) ×3 IMPLANT
CATH 8FR SOUNDSTAR REPROCESSED (CATHETERS) IMPLANT
CATH OCTARAY 1.5 F (CATHETERS) ×2 IMPLANT
CATH S CIRCA THERM PROBE 10F (CATHETERS) ×2 IMPLANT
CATH SMTCH THERMOCOOL SF DF (CATHETERS) ×2 IMPLANT
CATH WEBSTER BI DIR CS D-F CRV (CATHETERS) ×2 IMPLANT
CLOSURE PERCLOSE PROSTYLE (VASCULAR PRODUCTS) ×8 IMPLANT
COVER SWIFTLINK CONNECTOR (BAG) ×3 IMPLANT
KIT VERSACROSS STEERABLE D1 (CATHETERS) ×2 IMPLANT
MAT PREVALON FULL STRYKER (MISCELLANEOUS) ×2 IMPLANT
PACK EP LATEX FREE (CUSTOM PROCEDURE TRAY) ×3
PACK EP LF (CUSTOM PROCEDURE TRAY) ×1 IMPLANT
PAD DEFIB RADIO PHYSIO CONN (PAD) ×3 IMPLANT
PATCH CARTO3 (PAD) ×2 IMPLANT
SHEATH CARTO VIZIGO SM CVD (SHEATH) ×2 IMPLANT
SHEATH PINNACLE 7F 10CM (SHEATH) ×2 IMPLANT
SHEATH PINNACLE 8F 10CM (SHEATH) ×6 IMPLANT
SHEATH PINNACLE 9F 10CM (SHEATH) ×2 IMPLANT
SHEATH PROBE COVER 6X72 (BAG) ×2 IMPLANT
TUBING SMART ABLATE COOLFLOW (TUBING) ×2 IMPLANT

## 2021-12-10 NOTE — Anesthesia Preprocedure Evaluation (Addendum)
Anesthesia Evaluation  Patient identified by MRN, date of birth, ID band Patient awake    Reviewed: Allergy & Precautions, NPO status , Patient's Chart, lab work & pertinent test results  History of Anesthesia Complications Negative for: history of anesthetic complications  Airway Mallampati: III  TM Distance: >3 FB Neck ROM: Limited    Dental  (+) Dental Advisory Given, Partial Upper   Pulmonary asthma ,    Pulmonary exam normal        Cardiovascular hypertension, Pt. on medications and Pt. on home beta blockers + dysrhythmias Atrial Fibrillation + Valvular Problems/Murmurs MR  Rhythm:Irregular Rate:Normal   '22 Myoperfusion - The left ventricular ejection fraction is hyperdynamic (>65%). Nuclear stress EF: 74%. There was no ST segment deviation noted during stress. The study is normal. This is a low risk study.  '22 TTE - EF 50 to 55%. Left atrial size was moderately dilated. At most moderate mitral valve regurgitation with variability in beat to beat assessment. Pulmonary vein  systolic blunting. 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    Neuro/Psych  Headaches, negative psych ROS   GI/Hepatic Neg liver ROS, hiatal hernia, GERD  Medicated and Controlled,  Endo/Other  diabetes, Type 2, Oral Hypoglycemic Agents Obesity   Renal/GU negative Renal ROS     Musculoskeletal  (+) Arthritis , Fibromyalgia -  Abdominal   Peds  Hematology  On eliquis    Anesthesia Other Findings   Reproductive/Obstetrics                            Anesthesia Physical Anesthesia Plan  ASA: 3  Anesthesia Plan: General   Post-op Pain Management: Tylenol PO (pre-op)   Induction: Intravenous  PONV Risk Score and Plan: 3 and Treatment may vary due to age or medical condition, Ondansetron and Dexamethasone  Airway Management Planned: Oral ETT and Video Laryngoscope  Planned  Additional Equipment: None  Intra-op Plan:   Post-operative Plan: Extubation in OR  Informed Consent: I have reviewed the patients History and Physical, chart, labs and discussed the procedure including the risks, benefits and alternatives for the proposed anesthesia with the patient or authorized representative who has indicated his/her understanding and acceptance.     Dental advisory given  Plan Discussed with: CRNA and Anesthesiologist  Anesthesia Plan Comments:        Anesthesia Quick Evaluation

## 2021-12-10 NOTE — Discharge Instructions (Signed)

## 2021-12-10 NOTE — Anesthesia Postprocedure Evaluation (Signed)
Anesthesia Post Note  Patient: Kayla Bennett  Procedure(s) Performed: ATRIAL FIBRILLATION ABLATION     Patient location during evaluation: PACU Anesthesia Type: General Level of consciousness: awake and alert Pain management: pain level controlled Vital Signs Assessment: post-procedure vital signs reviewed and stable Respiratory status: spontaneous breathing, nonlabored ventilation, respiratory function stable and patient connected to nasal cannula oxygen Cardiovascular status: stable and blood pressure returned to baseline Anesthetic complications: no   There were no known notable events for this encounter.  Last Vitals:  Vitals:   12/10/21 1515 12/10/21 1530  BP: 137/78 (!) 153/74  Pulse: (!) 103 96  Resp: (!) 22 14  Temp:    SpO2: 91% 95%    Last Pain:  Vitals:   12/10/21 1425  TempSrc:   PainSc: 0-No pain                 Audry Pili

## 2021-12-10 NOTE — Transfer of Care (Signed)
Immediate Anesthesia Transfer of Care Note  Patient: Kayla Bennett  Procedure(s) Performed: ATRIAL FIBRILLATION ABLATION  Patient Location: Cath Lab  Anesthesia Type:General  Level of Consciousness: awake, alert  and oriented  Airway & Oxygen Therapy: Patient Spontanous Breathing and Patient connected to nasal cannula oxygen  Post-op Assessment: Report given to RN and Post -op Vital signs reviewed and stable  Post vital signs: Reviewed and stable  Last Vitals:  Vitals Value Taken Time  BP 164/81 12/10/21 1338  Temp    Pulse 95 12/10/21 1341  Resp 12 12/10/21 1341  SpO2 98 % 12/10/21 1341  Vitals shown include unvalidated device data.  Last Pain:  Vitals:   12/10/21 0905  TempSrc:   PainSc: 0-No pain         Complications: There were no known notable events for this encounter.

## 2021-12-10 NOTE — H&P (Signed)
Electrophysiology Office Note   Date:  12/10/2021   ID:  Sheng, Pritz 1951/06/06, MRN [id number]  PCP:  [name], MD  Cardiologist:  Johney Frame Primary Electrophysiologist:  Shifa Brisbon Meredith Leeds, MD    Chief Complaint: AF   History of Present Illness: Kayla Bennett is a 71 y.o. female who is being seen today for the evaluation of AF at the request of No ref. provider found. Presenting today for electrophysiology evaluation.  She has a history significant for hypertension, diabetes, breast cancer, fibromyalgia, atrial fibrillation.  She was diagnosed with atrial fibrillation in May 2022 on a preoperative ECG.  She was started on Eliquis.  She had a cardioversion 04/04/2021 but went back into atrial fibrillation 07/10/2021.  She was started on flecainide and metoprolol.  She had a repeat cardioversion 08/14/2021.  Unfortunately she went back into atrial fibrillation on 08/23/2021.  Her flecainide was increased and she had cardioversion 09/19/2021.  She does have a history of anemia, followed by hematology.  Today, denies symptoms of palpitations, chest pain, shortness of breath, orthopnea, PND, lower extremity edema, claudication, dizziness, presyncope, syncope, bleeding, or neurologic sequela. The patient is tolerating medications without difficulties. Plan for AF ablation today.    Past Medical History:  Diagnosis Date   Anemia    Arthritis    Asthma    Breast cancer ([place])    Diabetes ([place])    Dyspnea    Dysrhythmia    afibb on occasion   Family history of adverse reaction to anesthesia    mother had reaction to versed...during surgery, stopped breathing   Fibromyalgia    GERD (gastroesophageal reflux disease)    Headache    History of hiatal hernia    Hypertension    Migraines    Osteopenia    Psoriasis    Yeast infection 10/30/2021   Past Surgical History:  Procedure Laterality Date   ABDOMINAL HYSTERECTOMY     APPENDECTOMY     BREAST LUMPECTOMY  WITH RADIOACTIVE SEED AND SENTINEL LYMPH NODE BIOPSY Right 02/18/2021   Procedure: RIGHT BREAST LUMPECTOMY WITH RADIOACTIVE SEED AND SENTINEL LYMPH NODE BIOPSY;  Surgeon: [name], MD;  Location: [place];  Service: General;  Laterality: Right;   broken wrist     CARDIOVERSION N/A 04/04/2021   Procedure: CARDIOVERSION;  Surgeon: Sueanne Margarita, MD;  Location: [place] [facility];  Service: Cardiovascular;  Laterality: N/A;   CARDIOVERSION N/A 08/14/2021   Procedure: CARDIOVERSION;  Surgeon: Geralynn Rile, MD;  Location: [place];  Service: Cardiovascular;  Laterality: N/A;   CARDIOVERSION N/A 09/19/2021   Procedure: CARDIOVERSION;  Surgeon: Geralynn Rile, MD;  Location: [place];  Service: Cardiovascular;  Laterality: N/A;   [place]     right wrist   HYSTERECTOMY ABDOMINAL WITH SALPINGO-OOPHORECTOMY     LYSIS OF ADHESION     SHOULDER SURGERY Left    TUBAL LIGATION       Current Facility-Administered Medications  Medication Dose Route Frequency Provider Last Rate Last Admin   0.9 %  sodium chloride infusion   Intravenous Continuous Callaway Hardigree Hassell Done, MD       acetaminophen (TYLENOL) tablet 1,000 mg  1,000 mg Oral Once Audry Pili, MD        Allergies:   Lotensin [benazepril hcl], Morphine, Erythromycin base, Hydrocodone, Other, Banana, Chocolate, Gluten meal, Tizanidine, and Wound dressing adhesive   Social History:  The patient  reports that she has never smoked. She has never used smokeless tobacco. She reports that she does not drink alcohol and does not use drugs.   Family History:  The patient's family history includes Atrial fibrillation in her cousin and mother; Cancer in her maternal uncle; Colon cancer in her cousin and paternal uncle; Colon cancer (age of onset: 60) in her father; Heart attack (age of onset: 71) in her father; Heart attack (age of onset: 43) in her paternal grandmother; Heart  attack (age of onset: 73) in her mother.   ROS:  Please see the history of present illness.   Otherwise, review of systems is positive for none.   All other systems are reviewed and negative.   PHYSICAL EXAM: VS:  BP (!) 147/87    Pulse (!) 55    Temp 97.6 F (36.4 C) (Oral)    Resp 18    Ht 5\' 2"  (1.575 m)    Wt 88.5 kg    SpO2 96%    BMI 35.67 kg/m  , BMI Body mass index is 35.67 kg/m. GEN: Well nourished, well developed, in no acute distress  HEENT: normal  Neck: no JVD, carotid bruits, or masses Cardiac: irregular; no murmurs, rubs, or gallops,no edema  Respiratory:  clear to auscultation bilaterally, normal work of breathing GI: soft, nontender, nondistended, + BS MS: no deformity or atrophy  Skin: warm and dry Neuro:  Strength and sensation are intact Psych: euthymic mood, full affect  Recent Labs: 10/12/2021: B Natriuretic Peptide 277.5 10/13/2021: TSH 2.875 10/16/2021: Magnesium 1.9 10/25/2021: ALT 21 11/25/2021: BUN 23; Creatinine, Ser 0.94; Hemoglobin 12.1; Platelets 250; Potassium 4.2; Sodium 139    Lipid Panel  No results found for: CHOL, TRIG, HDL, CHOLHDL, VLDL, LDLCALC, LDLDIRECT   Wt Readings from Last 3 Encounters:  12/10/21 88.5 kg  12/09/21 89.4 kg  11/14/21 90.7 kg      Other studies Reviewed: Additional studies/ records that were reviewed today include: TTE 12/21/20  Review of the above records today demonstrates:   1. Left ventricular ejection fraction, by estimation, is 50 to 55%. The  left ventricle has low normal function. The left ventricle has no regional  wall motion abnormalities. Left ventricular diastolic parameters are  indeterminate.   2. Right ventricular systolic function is normal. The right ventricular  size is not well visualized.   3. Left atrial size was moderately dilated.   4. The mitral valve is normal in structure. At most moderate mitral valve  regurgitation with variability in beat to beat assessment. Pulmonary vein  systolic  blunting. 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 No evidence of mitral  stenosis.   5. The aortic valve is normal in structure. Aortic valve regurgitation is  not visualized. No aortic stenosis is present.   6. The inferior vena cava is normal in size with greater than 50%  respiratory variability, suggesting right atrial pressure of 3 mmHg.   Myoview 12/27/20 The left ventricular ejection fraction is hyperdynamic (>65%). Nuclear stress EF: 74%. There was no ST segment deviation noted during stress. The study is normal. This is a low risk study.    ASSESSMENT AND PLAN:  1.  Persistent atrial fibrillation: Shalyn Koral Wigley has presented today for surgery, with the diagnosis of atrial fibrillation.  The various methods of treatment have been discussed with the patient and family. After consideration of risks, benefits and other options for treatment, the patient has consented to  Procedure(s): Catheter ablation as a  surgical intervention .  Risks include but not limited to complete heart block, stroke, esophageal damage, nerve damage, bleeding, vascular damage, tamponade, perforation, MI, and death. The patient's history has been reviewed, patient examined, no change in status, stable for surgery.  I have reviewed the patient's chart and labs.  Questions were answered to the patient's satisfaction.    Ender Rorke Curt Bears, MD 12/10/2021 9:42 AM

## 2021-12-10 NOTE — Anesthesia Procedure Notes (Signed)
Procedure Name: Intubation Date/Time: 12/10/2021 11:15 AM Performed by: Griffin Dakin, CRNA Pre-anesthesia Checklist: Patient identified, Emergency Drugs available, Suction available and Patient being monitored Patient Re-evaluated:Patient Re-evaluated prior to induction Oxygen Delivery Method: Circle system utilized Preoxygenation: Pre-oxygenation with 100% oxygen Induction Type: IV induction Ventilation: Mask ventilation without difficulty Laryngoscope Size: Glidescope and 3 Grade View: Grade II Tube type: Oral Tube size: 7.0 mm Number of attempts: 1 Airway Equipment and Method: Rigid stylet and Video-laryngoscopy Placement Confirmation: ETT inserted through vocal cords under direct vision, positive ETCO2 and breath sounds checked- equal and bilateral Secured at: 22 cm Tube secured with: Tape Dental Injury: Teeth and Oropharynx as per pre-operative assessment

## 2021-12-11 ENCOUNTER — Telehealth: Payer: Self-pay | Admitting: *Deleted

## 2021-12-11 ENCOUNTER — Encounter (HOSPITAL_COMMUNITY): Payer: Self-pay | Admitting: Cardiology

## 2021-12-11 DIAGNOSIS — G4733 Obstructive sleep apnea (adult) (pediatric): Secondary | ICD-10-CM

## 2021-12-11 NOTE — Telephone Encounter (Signed)
The patient has been notified of the result and verbalized understanding.  All questions (if any) were answered. Marolyn Hammock, CMA 12/11/2021 5:18 PM    Titration to schedule

## 2021-12-11 NOTE — Progress Notes (Signed)
Left message for patient to return call.

## 2021-12-11 NOTE — Telephone Encounter (Signed)
-----   Message from Sueanne Margarita, MD sent at 11/19/2021  3:21 PM EST ----- Please let patient know that they have sleep apnea.  Recommend therapeutic CPAP titration for treatment of patient's sleep disordered breathing.  If unable to perform an in lab titration then initiate ResMed auto CPAP from 4 to 15cm H2O with heated humidity and mask of choice and overnight pulse ox on CPAP.

## 2021-12-16 ENCOUNTER — Other Ambulatory Visit: Payer: Self-pay | Admitting: Pharmacy Technician

## 2021-12-19 ENCOUNTER — Encounter: Payer: Medicare Other | Admitting: Rehabilitation

## 2021-12-23 ENCOUNTER — Ambulatory Visit: Payer: Medicare Other | Attending: General Surgery | Admitting: Physical Therapy

## 2021-12-23 ENCOUNTER — Other Ambulatory Visit: Payer: Self-pay

## 2021-12-23 ENCOUNTER — Encounter: Payer: Self-pay | Admitting: Physical Therapy

## 2021-12-23 DIAGNOSIS — R262 Difficulty in walking, not elsewhere classified: Secondary | ICD-10-CM | POA: Diagnosis not present

## 2021-12-23 DIAGNOSIS — R293 Abnormal posture: Secondary | ICD-10-CM | POA: Diagnosis not present

## 2021-12-23 DIAGNOSIS — Z170 Estrogen receptor positive status [ER+]: Secondary | ICD-10-CM | POA: Insufficient documentation

## 2021-12-23 DIAGNOSIS — M25611 Stiffness of right shoulder, not elsewhere classified: Secondary | ICD-10-CM | POA: Diagnosis not present

## 2021-12-23 DIAGNOSIS — Z483 Aftercare following surgery for neoplasm: Secondary | ICD-10-CM | POA: Insufficient documentation

## 2021-12-23 DIAGNOSIS — C50411 Malignant neoplasm of upper-outer quadrant of right female breast: Secondary | ICD-10-CM | POA: Diagnosis not present

## 2021-12-23 DIAGNOSIS — R600 Localized edema: Secondary | ICD-10-CM | POA: Diagnosis not present

## 2021-12-23 DIAGNOSIS — M6281 Muscle weakness (generalized): Secondary | ICD-10-CM | POA: Diagnosis not present

## 2021-12-23 NOTE — Therapy (Signed)
[facility] @ [address] [place], [place], [address] Phone: [phone]   Fax:  [phone]  Physical Therapy Treatment  Patient Details  Name: Kayla Bennett MRN: [id number] Date of Birth: 01-18-1951 Referring Provider (PT): Dr. [name]   Encounter Date: 12/23/2021   PT End of Session - 12/23/21 1305     Visit Number 9    Number of Visits 13    Date for PT Re-Evaluation 12/24/21    PT Start Time 1303    PT Stop Time 1354    PT Time Calculation (min) 51 min    Activity Tolerance Patient tolerated treatment well    Behavior During Therapy [facility] for tasks assessed/performed             Past Medical History:  Diagnosis Date   Anemia    Arthritis    Asthma    Breast cancer ([place])    Diabetes ([place])    Dyspnea    Dysrhythmia    afibb on occasion   Family history of adverse reaction to anesthesia    mother had reaction to versed...during surgery, stopped breathing   Fibromyalgia    GERD (gastroesophageal reflux disease)    Headache    History of hiatal hernia    Hypertension    Migraines    Osteopenia    Psoriasis    Yeast infection 10/30/2021    Past Surgical History:  Procedure Laterality Date   ABDOMINAL HYSTERECTOMY     APPENDECTOMY     ATRIAL FIBRILLATION ABLATION N/A 12/10/2021   Procedure: ATRIAL FIBRILLATION ABLATION;  Surgeon: Constance Haw, MD;  Location: [facility];  Service: Cardiovascular;  Laterality: N/A;   BREAST LUMPECTOMY WITH RADIOACTIVE SEED AND SENTINEL LYMPH NODE BIOPSY Right 02/18/2021   Procedure: RIGHT BREAST LUMPECTOMY WITH RADIOACTIVE SEED AND SENTINEL LYMPH NODE BIOPSY;  Surgeon: [name], MD;  Location: [facility];  Service: General;  Laterality: Right;   broken wrist     CARDIOVERSION N/A 04/04/2021   Procedure: CARDIOVERSION;  Surgeon: Sueanne Margarita, MD;  Location: [facility];  Service: Cardiovascular;  Laterality: N/A;   CARDIOVERSION N/A 08/14/2021    Procedure: CARDIOVERSION;  Surgeon: Geralynn Rile, MD;  Location: [facility];  Service: Cardiovascular;  Laterality: N/A;   CARDIOVERSION N/A 09/19/2021   Procedure: CARDIOVERSION;  Surgeon: Geralynn Rile, MD;  Location: [facility];  Service: Cardiovascular;  Laterality: N/A;   [place]     right wrist   HYSTERECTOMY ABDOMINAL WITH SALPINGO-OOPHORECTOMY     LYSIS OF ADHESION     SHOULDER SURGERY Left    TUBAL LIGATION      There were no vitals filed for this visit.   Subjective Assessment - 12/23/21 1305     Subjective I got a rash on my right breast and started running a fever. This happened after my heart procedure. I started antibiotics again.    Pertinent History Patient was diagnosed on 11/22/2020 with right invasive ductal carcinoma breast cancer. She underwent a right lumpectomy and sentinel node biopsy (5 negative nodes) on 02/18/2021. It is ER/PR positive and HER2 negative with a Ki67 of 10%. She has many orthopedic problems from a traumatic fall and other accidents, pt has had 3 cardioversions this year for atrial fib on 04/04/21, 08/14/21 and 09/19/21, has hx of rotator cuff surgery on the R    Patient Stated Goals  I want to be safe when I walk, I have stopped doing jobs that I have to walk any distance    Currently in Pain? No/denies    Pain Score 0-No pain                               [facility] Adult PT Treatment/Exercise - 12/23/21 0001       Knee/Hip Exercises: Aerobic   Nustep 8 min on level 1 with monitoring throughout at a slow pace with no use of UEs      Knee/Hip Exercises: Seated   Long Arc Quad Strengthening;Both;10 reps;2 sets    [employer] Limitations v/c to keep from inwardly rotating L foot    Marching Strengthening;Both;10 reps;2 sets   increased difficulty on L side   Sit to [employer] without UE support;10 reps;1 set      Shoulder Exercises: Pulleys   Flexion 2  minutes    ABduction 2 minutes   with only R shoulder in abduction to due limitations of L shoulder     Shoulder Exercises: Therapy Ball   Flexion 5 reps;Right   uisng left to assist until 90 degrees but pt unable to lift higher                         PT Long Term Goals - 11/26/21 0001       PT LONG TERM GOAL #1   Title Pt will demonstrate 4/5 bilateral hip flexion strength to decrease fall risk    Baseline R 3+, L 3-; 11/26/21- R 3- L 3+    Time 5    Period Weeks    Status On-going    Target Date 11/21/21      PT LONG TERM GOAL #2   Title Pt will demonstrate 4+/5 R knee flexion strength to decrease risk of falls    Baseline 3/5; 11/26/21- 3/5    Time 5    Period Weeks    Status On-going    Target Date 11/21/21      PT LONG TERM GOAL #3   Title Pt will demonstrate 4+/5 bilateral  knee extensor strength to decrease risk of falls.    Baseline 3/5 R, 4/5 L    Time 5    Period Weeks    Status On-going    Target Date 11/21/21      PT LONG TERM GOAL #4   Title Pt will report she feels safe returning to work because she is not afraid of falling    Baseline 11/26/21- pt is still fearful of falling at work    Time 5    Period Weeks    Status On-going    Target Date 11/21/21      PT LONG TERM GOAL #5   Title Pt will be indepdent in a home exercise program for continued stretching and strengthening    Time 5    Period Weeks    Status On-going    Target Date 11/21/21                   Plan - 12/23/21 1358     Clinical Impression Statement Pt just returned to PT after undergoing her cardioversion procedure 2 weeks ago. Pt was educated by her doctor not to exercise for a least a week so pt has not been doing her HEP. Began strengthening exercises again today. Pt was  more fatigued by end of session. She continues to demonstrate left sided weakness and decreased coordination with muscle substitution on the L. Educated pt to begin doing her home exercises  again. She also felt increased tenderness with pulleys today across right chest most likely due to scar tissue formation from seroma. Will assess pt's progress towards goals and updated POC at next session.    PT Frequency 2x / week    PT Duration --   5 weeks   PT Treatment/Interventions ADLs/Self Care Home Management;Therapeutic exercise;Patient/family education;Therapeutic activities;Neuromuscular re-education;Balance training;Gait training;Stair training;Manual techniques;Manual lymph drainage;Passive range of motion    PT Next Visit Plan update POC, how were new HEP exercises? **Do SOZO again when not in Afib** gentle  PROM to R shoulder, MFR to cord in R axilla if still present, try ranger maybe supine AROM due to Lt UE not able to help with AAROM  ( cont LE strengthening and high level balance exercises Rt leg is the weakest)    PT Home Exercise Plan Access Code: [email or web address]    Consulted and Agree with Plan of Care Patient             Patient will benefit from skilled therapeutic intervention in order to improve the following deficits and impairments:  Postural dysfunction, Decreased range of motion, Decreased knowledge of precautions, Impaired UE functional use, Pain, Increased fascial restricitons, Decreased strength, Decreased activity tolerance, Increased edema, Difficulty walking, Decreased balance, Decreased endurance  Visit Diagnosis: Muscle weakness (generalized)  Difficulty in walking, not elsewhere classified  Stiffness of right shoulder, not elsewhere classified  Localized edema  Abnormal posture  Malignant neoplasm of upper-outer quadrant of right breast in female, estrogen receptor positive ([place])     Problem List Patient Active Problem List   Diagnosis Date Noted   Yeast infection 10/30/2021   Breast abscess    Sepsis ([place]) 10/12/2021   Fever 10/12/2021   Nausea 10/12/2021   Headache 10/12/2021   Generalized weakness 10/12/2021   Diabetes ([place])     Hypertension    GERD (gastroesophageal reflux disease)    Iron deficiency anemia 09/25/2021   Secondary hypercoagulable state ([place]) 07/26/2021   Persistent atrial fibrillation ([facility])    Genetic testing 12/26/2020   Malignant neoplasm of upper-outer quadrant of right breast in female, estrogen receptor positive ([place]) 11/28/2020    Allyson Sabal [place], PT 12/23/2021, 2:00 PM  [facility] @ [facility] [place] [place], [place], [address] Phone: [phone]   Fax:  [phone]  Name: Kayla Bennett MRN: [id number] Date of Birth: 01-Dec-1950

## 2021-12-26 ENCOUNTER — Encounter: Payer: Medicare Other | Admitting: Physical Therapy

## 2021-12-27 ENCOUNTER — Encounter (HOSPITAL_BASED_OUTPATIENT_CLINIC_OR_DEPARTMENT_OTHER): Payer: Medicare Other | Admitting: Cardiology

## 2021-12-27 ENCOUNTER — Other Ambulatory Visit: Payer: Self-pay

## 2021-12-27 ENCOUNTER — Inpatient Hospital Stay: Payer: Medicare Other | Attending: Radiation Oncology

## 2021-12-27 DIAGNOSIS — D649 Anemia, unspecified: Secondary | ICD-10-CM

## 2021-12-27 DIAGNOSIS — C50411 Malignant neoplasm of upper-outer quadrant of right female breast: Secondary | ICD-10-CM | POA: Diagnosis not present

## 2021-12-27 DIAGNOSIS — D509 Iron deficiency anemia, unspecified: Secondary | ICD-10-CM

## 2021-12-27 DIAGNOSIS — E538 Deficiency of other specified B group vitamins: Secondary | ICD-10-CM

## 2021-12-27 LAB — CBC WITH DIFFERENTIAL (CANCER CENTER ONLY)
Abs Immature Granulocytes: 0.01 10*3/uL (ref 0.00–0.07)
Basophils Absolute: 0.1 10*3/uL (ref 0.0–0.1)
Basophils Relative: 1 %
Eosinophils Absolute: 0.2 10*3/uL (ref 0.0–0.5)
Eosinophils Relative: 5 %
HCT: 33 % — ABNORMAL LOW (ref 36.0–46.0)
Hemoglobin: 10.6 g/dL — ABNORMAL LOW (ref 12.0–15.0)
Immature Granulocytes: 0 %
Lymphocytes Relative: 29 %
Lymphs Abs: 1.3 10*3/uL (ref 0.7–4.0)
MCH: 30.7 pg (ref 26.0–34.0)
MCHC: 32.1 g/dL (ref 30.0–36.0)
MCV: 95.7 fL (ref 80.0–100.0)
Monocytes Absolute: 0.4 10*3/uL (ref 0.1–1.0)
Monocytes Relative: 9 %
Neutro Abs: 2.5 10*3/uL (ref 1.7–7.7)
Neutrophils Relative %: 56 %
Platelet Count: 179 10*3/uL (ref 150–400)
RBC: 3.45 MIL/uL — ABNORMAL LOW (ref 3.87–5.11)
RDW: 14.7 % (ref 11.5–15.5)
WBC Count: 4.5 10*3/uL (ref 4.0–10.5)
nRBC: 0 % (ref 0.0–0.2)

## 2021-12-27 LAB — CMP (CANCER CENTER ONLY)
ALT: 25 U/L (ref 0–44)
AST: 43 U/L — ABNORMAL HIGH (ref 15–41)
Albumin: 3.8 g/dL (ref 3.5–5.0)
Alkaline Phosphatase: 64 U/L (ref 38–126)
Anion gap: 6 (ref 5–15)
BUN: 18 mg/dL (ref 8–23)
CO2: 29 mmol/L (ref 22–32)
Calcium: 8.7 mg/dL — ABNORMAL LOW (ref 8.9–10.3)
Chloride: 104 mmol/L (ref 98–111)
Creatinine: 0.79 mg/dL (ref 0.44–1.00)
GFR, Estimated: >60 mL/min (ref >60)
Glucose, Bld: 192 mg/dL — ABNORMAL HIGH (ref 70–99)
Potassium: 4.1 mmol/L (ref 3.5–5.1)
Sodium: 139 mmol/L (ref 135–145)
Total Bilirubin: 0.4 mg/dL (ref 0.3–1.2)
Total Protein: 6.5 g/dL (ref 6.5–8.1)

## 2021-12-27 LAB — IRON AND IRON BINDING CAPACITY (CC-WL,HP ONLY)
Iron: 85 ug/dL (ref 28–170)
Saturation Ratios: 21 % (ref 10.4–31.8)
TIBC: 414 ug/dL (ref 250–450)
UIBC: 329 ug/dL (ref 148–442)

## 2021-12-27 LAB — VITAMIN B12: Vitamin B-12: 388 pg/mL (ref 180–914)

## 2021-12-27 LAB — FERRITIN: Ferritin: 282 ng/mL (ref 11–307)

## 2021-12-31 ENCOUNTER — Encounter: Payer: Self-pay | Admitting: Physical Therapy

## 2021-12-31 ENCOUNTER — Other Ambulatory Visit: Payer: Self-pay

## 2021-12-31 ENCOUNTER — Encounter: Payer: Self-pay | Admitting: Cardiology

## 2021-12-31 ENCOUNTER — Ambulatory Visit: Payer: Medicare Other | Admitting: Physical Therapy

## 2021-12-31 DIAGNOSIS — Z170 Estrogen receptor positive status [ER+]: Secondary | ICD-10-CM

## 2021-12-31 DIAGNOSIS — M6281 Muscle weakness (generalized): Secondary | ICD-10-CM

## 2021-12-31 DIAGNOSIS — R262 Difficulty in walking, not elsewhere classified: Secondary | ICD-10-CM | POA: Diagnosis not present

## 2021-12-31 DIAGNOSIS — R293 Abnormal posture: Secondary | ICD-10-CM

## 2021-12-31 DIAGNOSIS — M25611 Stiffness of right shoulder, not elsewhere classified: Secondary | ICD-10-CM

## 2021-12-31 DIAGNOSIS — R600 Localized edema: Secondary | ICD-10-CM | POA: Diagnosis not present

## 2021-12-31 DIAGNOSIS — C50411 Malignant neoplasm of upper-outer quadrant of right female breast: Secondary | ICD-10-CM | POA: Diagnosis not present

## 2021-12-31 NOTE — Therapy (Signed)
Progress Note Reporting Period 10/17/22 to 12/31/21   See note below for Objective Data and Assessment of Progress/Goals.     [facility] @ [address] [place], [place], [address] Phone: [phone]   Fax:  [phone]  Physical Therapy Treatment  Patient Details  Name: Kayla Bennett MRN: [id number] Date of Birth: 1951-08-11 Referring Provider (PT): Dr. [name]   Encounter Date: 12/31/2021   PT End of Session - 12/31/21 1357     Visit Number 10    Number of Visits 18    Date for PT Re-Evaluation 01/28/22    PT Start Time 6389    PT Stop Time 1350    PT Time Calculation (min) 45 min             Past Medical History:  Diagnosis Date   Anemia    Arthritis    Asthma    Breast cancer ([place])    Diabetes ([place])    Dyspnea    Dysrhythmia    afibb on occasion   Family history of adverse reaction to anesthesia    mother had reaction to versed...during surgery, stopped breathing   Fibromyalgia    GERD (gastroesophageal reflux disease)    Headache    History of hiatal hernia    Hypertension    Migraines    Osteopenia    Psoriasis    Yeast infection 10/30/2021    Past Surgical History:  Procedure Laterality Date   ABDOMINAL HYSTERECTOMY     APPENDECTOMY     ATRIAL FIBRILLATION ABLATION N/A 12/10/2021   Procedure: ATRIAL FIBRILLATION ABLATION;  Surgeon: Constance Haw, MD;  Location: [facility];  Service: Cardiovascular;  Laterality: N/A;   BREAST LUMPECTOMY WITH RADIOACTIVE SEED AND SENTINEL LYMPH NODE BIOPSY Right 02/18/2021   Procedure: RIGHT BREAST LUMPECTOMY WITH RADIOACTIVE SEED AND SENTINEL LYMPH NODE BIOPSY;  Surgeon: [name], MD;  Location: [facility];  Service: General;  Laterality: Right;   broken wrist     CARDIOVERSION N/A 04/04/2021   Procedure: CARDIOVERSION;  Surgeon: Sueanne Margarita, MD;  Location: [facility];  Service: Cardiovascular;  Laterality: N/A;   CARDIOVERSION  N/A 08/14/2021   Procedure: CARDIOVERSION;  Surgeon: Geralynn Rile, MD;  Location: [facility];  Service: Cardiovascular;  Laterality: N/A;   CARDIOVERSION N/A 09/19/2021   Procedure: CARDIOVERSION;  Surgeon: Geralynn Rile, MD;  Location: [facility];  Service: Cardiovascular;  Laterality: N/A;   [place]     right wrist   HYSTERECTOMY ABDOMINAL WITH SALPINGO-OOPHORECTOMY     LYSIS OF ADHESION     SHOULDER SURGERY Left    TUBAL LIGATION      There were no vitals filed for this visit.   Subjective Assessment - 12/31/21 1305     Subjective I am doing pretty good. I don't think my heart is back in rhythm yet. Some days when I walk from the bedroom to the kitchen I get short of breath.    Pertinent History Patient was diagnosed on 11/22/2020 with right invasive ductal carcinoma breast cancer. She underwent a right lumpectomy and sentinel node biopsy (5 negative nodes) on 02/18/2021. It is ER/PR positive and HER2 negative with a Ki67 of 10%. She has many orthopedic problems from a traumatic fall and other accidents, pt has had 3 cardioversions this year for atrial fib on 04/04/21, 08/14/21 and 09/19/21, has hx of  rotator cuff surgery on the R    Patient Stated Goals I want to be safe when I walk, I have stopped doing jobs that I have to walk any distance    Currently in Pain? No/denies    Pain Score 0-No pain                [facility] PT Assessment - 12/31/21 0001       Sit to Stand   Comments 30 sec sit to stand: 9 reps without UE which is poor for her age - 77 is average for her age                           [facility] Adult PT Treatment/Exercise - 12/31/21 0001       Neuro Re-ed    Neuro Re-ed Details  Standing on air ex: heel raises with 2 losses of balance that required min to mod assist to correct x 10 reps with 1 HHA, then pt required seated recovery period, hip flexion with knee straight x 10  reps bilaterally with 2 HHA and v/c to keep core engaged so pt does not loose balance anteriorly then pt required seated recovery period, began hip abduction but pt became short of breath and required another seated recovery period in which vitals were monitored )2 93-97% and HR 99-107bpm stopped exercise at this point and just monitored pt                     PT Education - 12/31/21 1406     Education Details see assessment section for details    Person(s) Educated Patient    Methods Explanation    Comprehension Verbalized understanding                 PT Long Term Goals - 12/31/21 1307       PT LONG TERM GOAL #1   Title Pt will demonstrate 4/5 bilateral hip flexion strength to decrease fall risk    Baseline R 3+, L 3-; 11/26/21- R 3- L 3+; 12/31/21- R 3-, L 4/5    Time 5    Period Weeks    Status Partially Met      PT LONG TERM GOAL #2   Title Pt will demonstrate 4+/5 R knee flexion strength to decrease risk of falls    Baseline 3/5; 11/26/21- 3/5; 12/31/21- 4+/5    Time 5    Period Weeks    Status Achieved      PT LONG TERM GOAL #3   Title Pt will demonstrate 4+/5 bilateral  knee extensor strength to decrease risk of falls.    Baseline 3/5 R, 4/5 L; 12/31/21- R 4+/5    Time 5    Period Weeks    Status Achieved      PT LONG TERM GOAL #4   Title Pt will report she feels safe returning to work because she is not afraid of falling    Baseline 11/26/21- pt is still fearful of falling at work; 12/31/21- pt reports she still has some hesitancy due to fear of falling    Time 5    Period Weeks    Status On-going      PT LONG TERM GOAL #5   Title Pt will be indepdent in a home exercise program for continued stretching and strengthening    Time 5    Period Weeks    Status On-going  Plan - 12/31/21 1400     Clinical Impression Statement Pt reports this week she has been more short of breath. It has been 3 weeks since her cardiac  ablation. She reports her doctor educated her that she could return to work after a week. She has been more fatigued and breathless and has found it difficult to return to work. She required numerous seated recovery periods today in therapy due to breathlessness. Pt also reported she felt off - she could not think clearly but was not dizzy or light headed. Her O2 was in the mid to high 90s and her pulse was between 99 and 107 throughout. Educated pt to reach out to her doctor and let them know this is happening and to describe her symptoms. Stopped exercise and monitored pt. Pt reports she is fine to drive home and was feeling better. Asked pt numerous times if she felt well enough to leave and pt reported that she did. Assessed pt's progress towards goals in therapy. Pt has met some of her strengthening goals and everything has improved except her R hip flexion which remains very challenging and difficult for pt to coordinate. Educated pt to practice seated hip flexion at home. Pt also has increased difficulty with R hip abduction and difficulty with hip hiking. When she walks her R leg goes in to adduction in front of the left and causes pt to stagger. Educated pt to be aware of this and to practice hip hikes at home. Pt would benefit from additional skilled PT services to continue to improve strength and balance to decrease fall risk.    PT Frequency 2x / week    PT Duration 4 weeks    PT Treatment/Interventions ADLs/Self Care Home Management;Therapeutic exercise;Patient/family education;Therapeutic activities;Neuromuscular re-education;Balance training;Gait training;Stair training;Manual techniques;Manual lymph drainage;Passive range of motion    PT Next Visit Plan work on strengthening R hip flexors and hip hiking ability to decrease R hip adduction during ambulation **Do SOZO again when not in Afib** gentle  PROM to R shoulder, MFR to cord in R axilla if still present, try ranger maybe supine AROM due to  Lt UE not able to help with AAROM  ( cont LE strengthening and high level balance exercises Rt leg is the weakest)    PT Home Exercise Plan Access Code: [email or web address]    Consulted and Agree with Plan of Care Patient             Patient will benefit from skilled therapeutic intervention in order to improve the following deficits and impairments:  Postural dysfunction, Decreased range of motion, Decreased knowledge of precautions, Impaired UE functional use, Pain, Increased fascial restricitons, Decreased strength, Decreased activity tolerance, Increased edema, Difficulty walking, Decreased balance, Decreased endurance  Visit Diagnosis: Muscle weakness (generalized)  Difficulty in walking, not elsewhere classified  Stiffness of right shoulder, not elsewhere classified  Localized edema  Abnormal posture  Malignant neoplasm of upper-outer quadrant of right breast in female, estrogen receptor positive ([place])     Problem List Patient Active Problem List   Diagnosis Date Noted   Yeast infection 10/30/2021   Breast abscess    Sepsis ([place]) 10/12/2021   Fever 10/12/2021   Nausea 10/12/2021   Headache 10/12/2021   Generalized weakness 10/12/2021   Diabetes ([place])    Hypertension    GERD (gastroesophageal reflux disease)    Iron deficiency anemia 09/25/2021   Secondary hypercoagulable state ([place]) 07/26/2021   Persistent atrial fibrillation ([place])    Genetic testing  12/26/2020   Malignant neoplasm of upper-outer quadrant of right breast in female, estrogen receptor positive ([place]) 11/28/2020    Allyson Sabal [place], PT 12/31/2021, 2:09 PM  [facility] @ [address] [place], [place], [address] Phone: [phone]   Fax:  [phone]  Name: Kayla Bennett MRN: [id number] Date of Birth: [birth year]

## 2022-01-01 DIAGNOSIS — R928 Other abnormal and inconclusive findings on diagnostic imaging of breast: Secondary | ICD-10-CM | POA: Diagnosis not present

## 2022-01-02 ENCOUNTER — Telehealth: Payer: Self-pay | Admitting: *Deleted

## 2022-01-02 ENCOUNTER — Other Ambulatory Visit: Payer: Self-pay

## 2022-01-02 ENCOUNTER — Encounter: Payer: Self-pay | Admitting: Rehabilitation

## 2022-01-02 ENCOUNTER — Ambulatory Visit: Payer: Medicare Other | Admitting: Rehabilitation

## 2022-01-02 DIAGNOSIS — M6281 Muscle weakness (generalized): Secondary | ICD-10-CM | POA: Diagnosis not present

## 2022-01-02 DIAGNOSIS — R262 Difficulty in walking, not elsewhere classified: Secondary | ICD-10-CM | POA: Diagnosis not present

## 2022-01-02 DIAGNOSIS — R600 Localized edema: Secondary | ICD-10-CM

## 2022-01-02 DIAGNOSIS — C50411 Malignant neoplasm of upper-outer quadrant of right female breast: Secondary | ICD-10-CM

## 2022-01-02 DIAGNOSIS — R293 Abnormal posture: Secondary | ICD-10-CM

## 2022-01-02 DIAGNOSIS — Z483 Aftercare following surgery for neoplasm: Secondary | ICD-10-CM

## 2022-01-02 DIAGNOSIS — M25611 Stiffness of right shoulder, not elsewhere classified: Secondary | ICD-10-CM

## 2022-01-02 DIAGNOSIS — Z170 Estrogen receptor positive status [ER+]: Secondary | ICD-10-CM

## 2022-01-02 NOTE — Telephone Encounter (Signed)
Spoke with patient and gave her the date, time and location for her cpap titration. Patient was agreeable and appreciative for the call.

## 2022-01-02 NOTE — Therapy (Signed)
OUTPATIENT PHYSICAL THERAPY TREATMENT NOTE   Patient Name: Kayla Bennett MRN: [id number] DOB:05-26-1951, 71 y.o., female Today's Date: 01/02/2022  PCP: [name], MD REFERRING PROVIDER: [name], MD    Past Medical History:  Diagnosis Date   Anemia    Arthritis    Asthma    Breast cancer ([place])    Diabetes ([place])    Dyspnea    Dysrhythmia    afibb on occasion   Family history of adverse reaction to anesthesia    mother had reaction to versed...during surgery, stopped breathing   Fibromyalgia    GERD (gastroesophageal reflux disease)    Headache    History of hiatal hernia    Hypertension    Migraines    Osteopenia    Psoriasis    Yeast infection 10/30/2021   Past Surgical History:  Procedure Laterality Date   ABDOMINAL HYSTERECTOMY     APPENDECTOMY     ATRIAL FIBRILLATION ABLATION N/A 12/10/2021   Procedure: ATRIAL FIBRILLATION ABLATION;  Surgeon: Constance Haw, MD;  Location: [facility];  Service: Cardiovascular;  Laterality: N/A;   BREAST LUMPECTOMY WITH RADIOACTIVE SEED AND SENTINEL LYMPH NODE BIOPSY Right 02/18/2021   Procedure: RIGHT BREAST LUMPECTOMY WITH RADIOACTIVE SEED AND SENTINEL LYMPH NODE BIOPSY;  Surgeon: [name], MD;  Location: [facility];  Service: General;  Laterality: Right;   broken wrist     CARDIOVERSION N/A 04/04/2021   Procedure: CARDIOVERSION;  Surgeon: Sueanne Margarita, MD;  Location: [facility];  Service: Cardiovascular;  Laterality: N/A;   CARDIOVERSION N/A 08/14/2021   Procedure: CARDIOVERSION;  Surgeon: Geralynn Rile, MD;  Location: [facility];  Service: Cardiovascular;  Laterality: N/A;   CARDIOVERSION N/A 09/19/2021   Procedure: CARDIOVERSION;  Surgeon: Geralynn Rile, MD;  Location: [facility];  Service: Cardiovascular;  Laterality: N/A;   [place]     right wrist   HYSTERECTOMY ABDOMINAL WITH SALPINGO-OOPHORECTOMY     LYSIS  OF ADHESION     SHOULDER SURGERY Left    TUBAL LIGATION     Patient Active Problem List   Diagnosis Date Noted   Yeast infection 10/30/2021   Breast abscess    Sepsis ([place]) 10/12/2021   Fever 10/12/2021   Nausea 10/12/2021   Headache 10/12/2021   Generalized weakness 10/12/2021   Diabetes ([place])    Hypertension    GERD (gastroesophageal reflux disease)    Iron deficiency anemia 09/25/2021   Secondary hypercoagulable state ([place]) 07/26/2021   Persistent atrial fibrillation ([place])    Genetic testing 12/26/2020   Malignant neoplasm of upper-outer quadrant of right breast in female, estrogen receptor positive ([place]) 11/28/2020    REFERRING DIAG: Rt breast cancer, muscle weakness   THERAPY DIAG:  Muscle weakness (generalized)  Difficulty in walking, not elsewhere classified  Stiffness of right shoulder, not elsewhere classified  Localized edema  Abnormal posture  Malignant neoplasm of upper-outer quadrant of right breast in female, estrogen receptor positive ([place])  Aftercare following surgery for neoplasm  PERTINENT HISTORY: Patient was diagnosed on 11/22/2020 with right invasive ductal carcinoma breast cancer. She underwent a right lumpectomy and sentinel node biopsy (5 negative nodes) on 02/18/2021. It is ER/PR positive and HER2 negative with a Ki67 of 10%. She has many orthopedic problems from a traumatic fall and other accidents, pt has had 3 cardioversions this year for atrial fib on 04/04/21, 08/14/21 and 09/19/21, has hx of rotator cuff  surgery on the R   PRECAUTIONS: A-fib, recent cardioablation, lymphedema Risk Rt UE  SUBJECTIVE: I am feeling better than last time.  No SOB yet getting ready.  I had my breast US yesterday and one of them is gone and one is much smaller.   PAIN:  Are you having pain? No   TODAY'S TREATMENT:   Therapeutic Exercise: Nustep   UE only level 1 x 17mn with no SOB reported  LAQ    1# 2x10 Seated Marching 1# x 10 bil   Sit to Stand  x5     Parallel bars:    hip abd  1# 2x5 in front of mirror vcs to keep hips level bil hand hold  side step up x5 bil 4" in front of mirror with vcs to keep hips level bil hand hold  Neuromuscular Reeducation Cone weaving x 5 cones x 4 with CGA reporting feeling a bit SOB or off - discussed how it could be the feeling of off balance and increased breath holding.   Tapping colored circles with green small cone on top with vcs to not collapse the green cone.  CGA and HHA as needed.  Started with one color to touch and then ended with 2 colors in a row.    Clinical Impression Statement Pt did much better today.  Did not have any noteable SOB and no rests breaks that were not given by PT.  Focused on hip strengthening and balance activities.     PT Frequency; 2x / week x 5 weeks   PT Treatment/Interventions  ADLs/Self Care Home Management;Therapeutic exercise;Patient/family education;Therapeutic activities;Neuromuscular re-education;Balance training;Gait training;Stair training;Manual techniques;Manual lymph drainage;Passive range of motion    PT Next Visit Plan:  **Do SOZO again when not in Afib**  cont LE strengthening and high level balance exercises    PT Home Exercise Plan  Access Code: [email or web address]    PT Long Term Goals - 01/02/22 0806       PT LONG TERM GOAL #1   Title Pt will demonstrate 4/5 bilateral hip flexion strength to decrease fall risk    Baseline R 3+, L 3-; 11/26/21- R 3- L 3+; 12/31/21- R 3-, L 4/5    Status Partially Met      PT LONG TERM GOAL #2   Title Pt will demonstrate 4+/5 R knee flexion strength to decrease risk of falls    Baseline 3/5; 11/26/21- 3/5; 12/31/21- 4+/5    Status Achieved      PT LONG TERM GOAL #3   Title Pt will demonstrate 4+/5 bilateral  knee extensor strength to decrease risk of falls.    Baseline 3/5 R, 4/5 L; 12/31/21- R 4+/5    Status Achieved      PT LONG TERM GOAL #4   Title Pt will report she feels safe returning to work because she is not  afraid of falling    Baseline 11/26/21- pt is still fearful of falling at work; 12/31/21- pt reports she still has some hesitancy due to fear of falling    Status On-going      PT LONG TERM GOAL #5   Title Pt will be indepdent in a home exercise program for continued stretching and strengthening    Status On-going                KShan Levans PT  01/02/2022, 8:07 AM

## 2022-01-06 ENCOUNTER — Other Ambulatory Visit: Payer: Self-pay

## 2022-01-06 ENCOUNTER — Ambulatory Visit: Payer: Medicare Other | Admitting: Physical Therapy

## 2022-01-06 ENCOUNTER — Encounter: Payer: Self-pay | Admitting: Physical Therapy

## 2022-01-06 DIAGNOSIS — C50411 Malignant neoplasm of upper-outer quadrant of right female breast: Secondary | ICD-10-CM | POA: Diagnosis not present

## 2022-01-06 DIAGNOSIS — R293 Abnormal posture: Secondary | ICD-10-CM

## 2022-01-06 DIAGNOSIS — M25611 Stiffness of right shoulder, not elsewhere classified: Secondary | ICD-10-CM | POA: Diagnosis not present

## 2022-01-06 DIAGNOSIS — R600 Localized edema: Secondary | ICD-10-CM

## 2022-01-06 DIAGNOSIS — M6281 Muscle weakness (generalized): Secondary | ICD-10-CM

## 2022-01-06 DIAGNOSIS — Z170 Estrogen receptor positive status [ER+]: Secondary | ICD-10-CM

## 2022-01-06 DIAGNOSIS — R262 Difficulty in walking, not elsewhere classified: Secondary | ICD-10-CM | POA: Diagnosis not present

## 2022-01-06 NOTE — Therapy (Signed)
OUTPATIENT PHYSICAL THERAPY TREATMENT NOTE   Patient Name: Kayla Bennett MRN: [id number] DOB:08/12/1951, 71 y.o., female Today's Date: 01/06/2022  PCP: [name], MD REFERRING PROVIDER: [name], MD   PT End of Session - 01/06/22 1414     Visit Number 12    Number of Visits 18    Date for PT Re-Evaluation 01/28/22    PT Start Time 8341    PT Stop Time 1453    PT Time Calculation (min) 41 min    Activity Tolerance Patient tolerated treatment well    Behavior During Therapy [facility] for tasks assessed/performed             Past Medical History:  Diagnosis Date   Anemia    Arthritis    Asthma    Breast cancer ([place])    Diabetes ([place])    Dyspnea    Dysrhythmia    afibb on occasion   Family history of adverse reaction to anesthesia    mother had reaction to versed...during surgery, stopped breathing   Fibromyalgia    GERD (gastroesophageal reflux disease)    Headache    History of hiatal hernia    Hypertension    Migraines    Osteopenia    Psoriasis    Yeast infection 10/30/2021   Past Surgical History:  Procedure Laterality Date   ABDOMINAL HYSTERECTOMY     APPENDECTOMY     ATRIAL FIBRILLATION ABLATION N/A 12/10/2021   Procedure: ATRIAL FIBRILLATION ABLATION;  Surgeon: Constance Haw, MD;  Location: [place] [facility];  Service: Cardiovascular;  Laterality: N/A;   BREAST LUMPECTOMY WITH RADIOACTIVE SEED AND SENTINEL LYMPH NODE BIOPSY Right 02/18/2021   Procedure: RIGHT BREAST LUMPECTOMY WITH RADIOACTIVE SEED AND SENTINEL LYMPH NODE BIOPSY;  Surgeon: [name], MD;  Location: [place];  Service: General;  Laterality: Right;   broken wrist     CARDIOVERSION N/A 04/04/2021   Procedure: CARDIOVERSION;  Surgeon: Sueanne Margarita, MD;  Location: [place] [facility];  Service: Cardiovascular;  Laterality: N/A;   CARDIOVERSION N/A 08/14/2021   Procedure: CARDIOVERSION;  Surgeon: Geralynn Rile, MD;  Location: [place];  Service: Cardiovascular;   Laterality: N/A;   CARDIOVERSION N/A 09/19/2021   Procedure: CARDIOVERSION;  Surgeon: Geralynn Rile, MD;  Location: [place];  Service: Cardiovascular;  Laterality: N/A;   [place]     right wrist   HYSTERECTOMY ABDOMINAL WITH SALPINGO-OOPHORECTOMY     LYSIS OF ADHESION     SHOULDER SURGERY Left    TUBAL LIGATION     Patient Active Problem List   Diagnosis Date Noted   Yeast infection 10/30/2021   Breast abscess    Sepsis ([place]) 10/12/2021   Fever 10/12/2021   Nausea 10/12/2021   Headache 10/12/2021   Generalized weakness 10/12/2021   Diabetes ([place])    Hypertension    GERD (gastroesophageal reflux disease)    Iron deficiency anemia 09/25/2021   Secondary hypercoagulable state ([place]) 07/26/2021   Persistent atrial fibrillation ([place])    Genetic testing 12/26/2020   Malignant neoplasm of upper-outer quadrant of right breast in female, estrogen receptor positive ([place]) 11/28/2020    REFERRING DIAG: Rt breast cancer, muscle weakness   THERAPY DIAG:  Muscle weakness (generalized)  Difficulty in walking, not elsewhere classified  Stiffness of right shoulder, not elsewhere classified  Localized edema  Abnormal posture  Malignant neoplasm of upper-outer quadrant of right breast in female, estrogen  receptor positive ([place])  PERTINENT HISTORY: Patient was diagnosed on 11/22/2020 with right invasive ductal carcinoma breast cancer. She underwent a right lumpectomy and sentinel node biopsy (5 negative nodes) on 02/18/2021. It is ER/PR positive and HER2 negative with a Ki67 of 10%. She has many orthopedic problems from a traumatic fall and other accidents, pt has had 3 cardioversions this year for atrial fib on 04/04/21, 08/14/21 and 09/19/21, has hx of rotator cuff surgery on the R   PRECAUTIONS: A-fib, recent cardioablation, lymphedema Risk Rt UE  SUBJECTIVE: I feel like I used all my energy today because I have been  working this morning. I called the heart doctor and they did not seem too concerned. They are going to see me tomorrow. My heart rate has been 110.   PAIN:  Are you having pain? No   TODAY'S TREATMENT:   Therapeutic Exercise: Nustep   UE only level 3 x 7 min with no SOB reported; seat at 8, no UEs LAQ    1# 2x10 Seated Marching 1# x 10 bil then another set of 5 bil  Sit to Stand   2 sets of 5    Parallel bars:    hip abd  1# 1 set of 10 with patient able to demonstrate good hip hike to clear floor - pt states she felt off after this so she had a seated recovery period. )2 was 98 and HR was 98.   side step up x 10 bil 4" with bilateral hand held hold on // bars Neuromuscular Reeducation Cone weaving x 3 cones x 4 with CGA and 1 lb ankle weights on; pt lost balance reporting feeling a bit off (she reports not dizzy just wobbly and no significant shortness of breath). She was able to self correct.  Tapping colored circles with green small cone on top with 1 lb ankle weights on no vcs needed. Pt demonstrated great balance tapping from one color to another with no losses of balance and only one hand held assist.   Clinical Impression Statement Pt did very well today despite feeling fatigued at beginning of session. She only had one loss of balance during cone weaving and lost her balance posteriorly and was able to self correct.  Pt demonstrated R hip abduction with ease today and was able to correctly demonstrate hip hike in order to clear her foot from the floor.    PT Frequency; 2x / week x 4 weeks   PT Treatment/Interventions  ADLs/Self Care Home Management;Therapeutic exercise;Patient/family education;Therapeutic activities;Neuromuscular re-education;Balance training;Gait training;Stair training;Manual techniques;Manual lymph drainage;Passive range of motion    PT Next Visit Plan:  **Do SOZO again when not in Afib**  cont LE strengthening and high level balance exercises    PT Home  Exercise Plan  Access Code: [email or web address]     PT Long Term Goals - 01/02/22 0806       PT LONG TERM GOAL #1   Title Pt will demonstrate 4/5 bilateral hip flexion strength to decrease fall risk    Baseline R 3+, L 3-; 11/26/21- R 3- L 3+; 12/31/21- R 3-, L 4/5    Status Partially Met      PT LONG TERM GOAL #2   Title Pt will demonstrate 4+/5 R knee flexion strength to decrease risk of falls    Baseline 3/5; 11/26/21- 3/5; 12/31/21- 4+/5    Status Achieved      PT LONG TERM GOAL #3   Title Pt will demonstrate 4+/5 bilateral  knee extensor strength to decrease risk of falls.    Baseline 3/5 R, 4/5 L; 12/31/21- R 4+/5    Status Achieved      PT LONG TERM GOAL #4   Title Pt will report she feels safe returning to work because she is not afraid of falling    Baseline 11/26/21- pt is still fearful of falling at work; 12/31/21- pt reports she still has some hesitancy due to fear of falling    Status On-going      PT LONG TERM GOAL #5   Title Pt will be indepdent in a home exercise program for continued stretching and strengthening    Status On-going               [employer], PT 01/06/22 2:58 PM

## 2022-01-07 ENCOUNTER — Ambulatory Visit (HOSPITAL_COMMUNITY)
Admission: RE | Admit: 2022-01-07 | Discharge: 2022-01-07 | Disposition: A | Discharge status: Home / Self Care | Payer: Medicare Other | Source: Ambulatory Visit | Attending: Physician Assistant | Admitting: Physician Assistant

## 2022-01-07 VITALS — BP 112/64 | HR 83 | Ht 62.0 in | Wt 195.4 lb

## 2022-01-07 DIAGNOSIS — D6869 Other thrombophilia: Secondary | ICD-10-CM | POA: Diagnosis not present

## 2022-01-07 DIAGNOSIS — Z7901 Long term (current) use of anticoagulants: Secondary | ICD-10-CM | POA: Diagnosis not present

## 2022-01-07 DIAGNOSIS — E1169 Type 2 diabetes mellitus with other specified complication: Secondary | ICD-10-CM | POA: Diagnosis not present

## 2022-01-07 DIAGNOSIS — M797 Fibromyalgia: Secondary | ICD-10-CM | POA: Diagnosis not present

## 2022-01-07 DIAGNOSIS — Z6835 Body mass index (BMI) 35.0-35.9, adult: Secondary | ICD-10-CM | POA: Insufficient documentation

## 2022-01-07 DIAGNOSIS — Z853 Personal history of malignant neoplasm of breast: Secondary | ICD-10-CM | POA: Insufficient documentation

## 2022-01-07 DIAGNOSIS — D649 Anemia, unspecified: Secondary | ICD-10-CM | POA: Diagnosis not present

## 2022-01-07 DIAGNOSIS — I4819 Other persistent atrial fibrillation: Secondary | ICD-10-CM

## 2022-01-07 DIAGNOSIS — I10 Essential (primary) hypertension: Secondary | ICD-10-CM | POA: Diagnosis not present

## 2022-01-07 DIAGNOSIS — E669 Obesity, unspecified: Secondary | ICD-10-CM | POA: Diagnosis not present

## 2022-01-07 DIAGNOSIS — G4733 Obstructive sleep apnea (adult) (pediatric): Secondary | ICD-10-CM | POA: Diagnosis not present

## 2022-01-07 DIAGNOSIS — E7800 Pure hypercholesterolemia, unspecified: Secondary | ICD-10-CM | POA: Diagnosis not present

## 2022-01-07 NOTE — Progress Notes (Signed)
Primary Care Physician: [name], MD Primary Cardiologist: Dr Johney Frame  Primary Electrophysiologist: Dr Curt Bears  Referring Physician: Richardson Dopp PA   Kayla Bennett is a 71 y.o. female with a history of HTN, DM, breast cancer, fibromyalgia, atrial fibrillation who presents for follow up in the [facility]. The patient was initially diagnosed with atrial fibrillation 03/2021 incidentally on a preoperative ECG. Patient is on Eliquis for a CHADS2VASC score of 4. She underwent DCCV 04/04/21 but was back in afib on follow up 07/10/21. She was started on flecainide and metoprolol. She was seen at the ED 07/25/21 for abdominal/right flank pain. Her Hgb was noted to be down to 8.7 (baseline around 10-11). Per ED note, rectal exam revealed no bleeding. Patient is s/p DCCV on 08/14/21. Unfortunately, she was back in afib on follow up 08/23/21. Her flecainide was increased.   Patient is s/p DCCV on 09/19/21 with quick return of afib. She was admitted 09/2021 for sepsis of unknown source. She underwent afib ablation 12/10/21 with Dr Curt Bears. She has done well from a cardiac standpoint and remains in SR. She denies CP, swallowing pain, or groin issues. She is currently working with PT for her gait instability.   Today, she denies symptoms of palpitations, chest pain, orthopnea, PND, lower extremity edema, dizziness, presyncope, syncope, snoring, daytime somnolence, bleeding, or neurologic sequela. The patient is tolerating medications without difficulties and is otherwise without complaint today.    Atrial Fibrillation Risk Factors:  she does have symptoms or diagnosis of sleep apnea. she does not have a history of rheumatic fever.   she has a BMI of Body mass index is 35.74 kg/m.Marland Kitchen Filed Weights   01/07/22 1415  Weight: 88.6 kg      Family History  Problem Relation Age of Onset   Atrial fibrillation Mother    Heart attack Mother 55   Colon cancer Father 88    Heart attack Father 45   Heart attack Paternal Grandmother 86   Cancer Maternal Uncle        melanoma    Colon cancer Paternal Uncle    Colon cancer Cousin    Atrial fibrillation Cousin      Atrial Fibrillation Management history:  Previous antiarrhythmic drugs: flecainide  Previous cardioversions: 04/04/21, 08/14/21, 09/19/21 Previous ablations: 12/10/21 CHADS2VASC score: 4 Anticoagulation history: Eliquis   Past Medical History:  Diagnosis Date   Anemia    Arthritis    Asthma    Breast cancer ([place])    Diabetes ([place])    Dyspnea    Dysrhythmia    afibb on occasion   Family history of adverse reaction to anesthesia    mother had reaction to versed...during surgery, stopped breathing   Fibromyalgia    GERD (gastroesophageal reflux disease)    Headache    History of hiatal hernia    Hypertension    Migraines    Osteopenia    Psoriasis    Yeast infection 10/30/2021   Past Surgical History:  Procedure Laterality Date   ABDOMINAL HYSTERECTOMY     APPENDECTOMY     ATRIAL FIBRILLATION ABLATION N/A 12/10/2021   Procedure: ATRIAL FIBRILLATION ABLATION;  Surgeon: Constance Haw, MD;  Location: [place] [facility];  Service: Cardiovascular;  Laterality: N/A;   BREAST LUMPECTOMY WITH RADIOACTIVE SEED AND SENTINEL LYMPH NODE BIOPSY Right 02/18/2021   Procedure: RIGHT BREAST LUMPECTOMY WITH RADIOACTIVE SEED AND SENTINEL LYMPH NODE BIOPSY;  Surgeon: [name], MD;  Location: [place];  Service: General;  Laterality: Right;   broken wrist     CARDIOVERSION N/A 04/04/2021   Procedure: CARDIOVERSION;  Surgeon: Sueanne Margarita, MD;  Location: [facility];  Service: Cardiovascular;  Laterality: N/A;   CARDIOVERSION N/A 08/14/2021   Procedure: CARDIOVERSION;  Surgeon: Geralynn Rile, MD;  Location: [place];  Service: Cardiovascular;  Laterality: N/A;   CARDIOVERSION N/A 09/19/2021   Procedure: CARDIOVERSION;  Surgeon: Geralynn Rile, MD;  Location: [place];   Service: Cardiovascular;  Laterality: N/A;   [place]     right wrist   HYSTERECTOMY ABDOMINAL WITH SALPINGO-OOPHORECTOMY     LYSIS OF ADHESION     SHOULDER SURGERY Left    TUBAL LIGATION      Current Outpatient Medications  Medication Sig Dispense Refill   acetaminophen (TYLENOL) 500 MG tablet Take 500 mg by mouth every 6 (six) hours as needed for moderate pain, headache or mild pain.     albuterol (VENTOLIN HFA) 108 (90 Base) MCG/ACT inhaler Inhale 1-2 puffs into the lungs every 6 (six) hours as needed for shortness of breath or wheezing (Asthma).     apixaban (ELIQUIS) 5 MG TABS tablet Take 1 tablet (5 mg total) by mouth 2 (two) times daily. Pt needs 90 day supply.  Please keep on file till next refill. 180 tablet 3   calcipotriene (DOVONOX) 0.005 % cream Apply 1 application topically See admin instructions. Mix with fluorouracil 5% cream and apply topically twice daily for five days - as needed for precancerous on right cheek.     cetirizine (ZYRTEC) 10 MG tablet Take 10 mg by mouth in the morning.     cholecalciferol (VITAMIN D3) 25 MCG (1000 UNIT) tablet Take 1,000-2,000 Units by mouth See admin instructions. Take 1000 units in the morning and 2000 units at bedtime     ferrous sulfate 325 (65 FE) MG tablet Take 325 mg by mouth every morning.     fluconazole (DIFLUCAN) 150 MG tablet Take 1 tablet by mouth every 72 hours as needed. 5 tablet 0   fluorouracil (EFUDEX) 5 % cream Apply 1 application topically 2 (two) times daily as needed (precancerous spots on right cheek). Mix with calcipotriene 0.005% cream and apply topically twice daily for five days - as needed for precancerous on right cheek.     gabapentin (NEURONTIN) 100 MG capsule Take 200 mg by mouth at bedtime.     losartan-hydrochlorothiazide (HYZAAR) 100-25 MG tablet Take 1 tablet by mouth daily. 30 tablet 3   metFORMIN (GLUCOPHAGE-XR) 500 MG 24 hr tablet Take 500 mg  by mouth at bedtime.     metoprolol succinate (TOPROL XL) 25 MG 24 hr tablet Take 1 tablet (25 mg total) by mouth daily.     pantoprazole (PROTONIX) 40 MG tablet Take 40 mg by mouth 2 (two) times daily before a meal.     potassium chloride SA (KLOR-CON M20) 20 MEQ tablet Take 1 tablet (20 mEq total) by mouth daily. 30 tablet 6   Prenatal Vit-Fe Fumarate-FA (PRENATAL VITAMIN PLUS LOW IRON PO) Take 1 tablet by mouth in the morning.     SUMAtriptan (IMITREX) 100 MG tablet Take 100 mg by mouth See admin instructions. Take one tablet (100 mg) by mouth at onset of migraine headache, may repeat in 2 hours if headache persists or recurs.     tamoxifen (NOLVADEX) 20 MG tablet TAKE 1 TABLET(20 MG) BY MOUTH DAILY (  Patient taking differently: Take 20 mg by mouth every morning.) 30 tablet 3   triamcinolone (KENALOG) 0.1 % Apply 1 application topically 2 (two) times daily as needed (psoriasis). Mixed with Cetaphil 1:1     venlafaxine XR (EFFEXOR-XR) 37.5 MG 24 hr capsule Take 37.5 mg by mouth at bedtime. Take 1 capsule (75 mg) + 1 capsule (37.5 mg) to equal 112.5 mg at bedtime     venlafaxine XR (EFFEXOR-XR) 75 MG 24 hr capsule Take 75 mg by mouth at bedtime. Take 1 capsule (75 mg) + 1 capsule (37.5 mg) to equal 112.5 mg  at bedtime     vitamin B-12 (CYANOCOBALAMIN) 1000 MCG tablet Take 1,000 mcg by mouth every morning.     vitamin C (ASCORBIC ACID) 500 MG tablet Take 500 mg by mouth daily.     No current facility-administered medications for this encounter.    Allergies  Allergen Reactions   Lotensin [Benazepril Hcl] Swelling   Morphine Itching and Rash   Erythromycin Base Other (See Comments)    Unknown reaction   Hydrocodone Other (See Comments)    Unknown reaction     Other Swelling    Mustard - vaginal swelling/pain    Banana Swelling    vaginal swelling/pain   Chocolate Swelling    vaginal swelling/pain   Gluten Meal Diarrhea    Upset stomach    Tizanidine Other (See Comments)    Lowered  blood pressure    Wound Dressing Adhesive Other (See Comments)    Skin burns, blisters     Social History   Socioeconomic History   Marital status: Married    Spouse name: Not on file   Number of children: 2   Years of education: Not on file   Highest education level: Not on file  Occupational History   Not on file  Tobacco Use   Smoking status: Never   Smokeless tobacco: Never  Vaping Use   Vaping Use: Never used  Substance and Sexual Activity   Alcohol use: Never   Drug use: Never   Sexual activity: Not on file  Other Topics Concern   Not on file  Social History Narrative   Not on file   Social Determinants of Health   Financial Resource Strain: Not on file  Food Insecurity: Not on file  Transportation Needs: Not on file  Physical Activity: Not on file  Stress: Not on file  Social Connections: Not on file  Intimate Partner Violence: Not on file     ROS- All systems are reviewed and negative except as per the HPI above.  Physical Exam: Vitals:   01/07/22 1415  BP: 112/64  Pulse: 83  Weight: 88.6 kg  Height: 5\' 2"  (1.575 m)    GEN- The patient is a well appearing obese female, alert and oriented x 3 today.   HEENT-head normocephalic, atraumatic, sclera clear, conjunctiva pink, hearing intact, trachea midline. Lungs- Clear to ausculation bilaterally, normal work of breathing Heart- Regular rate and rhythm, no murmurs, rubs or gallops  GI- soft, NT, ND, + BS Extremities- no clubbing, cyanosis, or edema MS- no significant deformity or atrophy Skin- no rash or lesion Psych- euthymic mood, full affect Neuro- strength and sensation are intact   Wt Readings from Last 3 Encounters:  01/07/22 88.6 kg  12/10/21 88.5 kg  12/09/21 89.4 kg    EKG today demonstrates  SR Vent. rate 83 BPM PR interval 164 ms QRS duration 68 ms QT/QTcB 382/448 ms  Echo 12/21/20  demonstrated   1. Left ventricular ejection fraction, by estimation, is 50 to 55%. The  left  ventricle has low normal function. The left ventricle has no regional wall motion abnormalities. Left ventricular diastolic parameters are indeterminate.   2. Right ventricular systolic function is normal. The right ventricular  size is not well visualized.   3. Left atrial size was moderately dilated.   4. The mitral valve is normal in structure. At most moderate mitral valve regurgitation with variability in beat to beat assessment. Pulmonary vein systolic blunting. 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 No evidence of mitral stenosis.   5. The aortic valve is normal in structure. Aortic valve regurgitation is not visualized. No aortic stenosis is present.   6. The inferior vena cava is normal in size with greater than 50%  respiratory variability, suggesting right atrial pressure of 3 mmHg.   Epic records are reviewed at length today  CHA2DS2-VASc Score = 4  The patient's score is based upon: CHF History: 0 HTN History: 1 Diabetes History: 1 Stroke History: 0 Vascular Disease History: 0 Age Score: 1 Gender Score: 1        ASSESSMENT AND PLAN: 1. Persistent Atrial Fibrillation (ICD10:  I48.19) The patient's CHA2DS2-VASc score is 4, indicating a 4.8% annual risk of stroke.   S/p afib ablation 12/10/21 Patient appears to be maintaining SR. Continue Toprol 25 mg daily Continue Eliquis 5 mg BID with no missed doses for 3 months post ablation.   2. Secondary Hypercoagulable State (ICD10:  D68.69) The patient is at significant risk for stroke/thromboembolism based upon her CHA2DS2-VASc Score of 4.  Continue Apixaban (Eliquis).   3. Obesity Body mass index is 35.74 kg/m. Lifestyle modification was discussed and encouraged including regular physical activity and weight reduction.  4. OSA She is scheduled for CPAP titration 01/29/22  5. HTN Stable, no changes today.  6. Anemia Followed by hematology.   Follow up with Dr Curt Bears as scheduled.      [facility] [address] [place], [place] [address] [phone] 01/07/2022 2:33 PM

## 2022-01-08 ENCOUNTER — Encounter: Payer: Self-pay | Admitting: Rehabilitation

## 2022-01-08 ENCOUNTER — Other Ambulatory Visit: Payer: Self-pay

## 2022-01-08 ENCOUNTER — Ambulatory Visit: Payer: Medicare Other | Admitting: Rehabilitation

## 2022-01-08 DIAGNOSIS — M6281 Muscle weakness (generalized): Secondary | ICD-10-CM

## 2022-01-08 DIAGNOSIS — C50411 Malignant neoplasm of upper-outer quadrant of right female breast: Secondary | ICD-10-CM

## 2022-01-08 DIAGNOSIS — R600 Localized edema: Secondary | ICD-10-CM | POA: Diagnosis not present

## 2022-01-08 DIAGNOSIS — R262 Difficulty in walking, not elsewhere classified: Secondary | ICD-10-CM

## 2022-01-08 DIAGNOSIS — Z170 Estrogen receptor positive status [ER+]: Secondary | ICD-10-CM

## 2022-01-08 DIAGNOSIS — M25611 Stiffness of right shoulder, not elsewhere classified: Secondary | ICD-10-CM | POA: Diagnosis not present

## 2022-01-08 DIAGNOSIS — Z483 Aftercare following surgery for neoplasm: Secondary | ICD-10-CM

## 2022-01-08 DIAGNOSIS — R293 Abnormal posture: Secondary | ICD-10-CM | POA: Diagnosis not present

## 2022-01-08 NOTE — Therapy (Signed)
OUTPATIENT PHYSICAL THERAPY TREATMENT NOTE   Patient Name: Kayla Bennett MRN: [id number] DOB:1951-01-28, 71 y.o., female Today's Date: 01/08/2022  PCP: [name], MD REFERRING PROVIDER: [name], MD   PT End of Session - 01/08/22 1705     Visit Number 13    Number of Visits 18    Date for PT Re-Evaluation 01/28/22    PT Start Time 1600    PT Stop Time 1653    PT Time Calculation (min) 53 min    Activity Tolerance Patient tolerated treatment well    Behavior During Therapy WFL for tasks assessed/performed              Past Medical History:  Diagnosis Date   Anemia    Arthritis    Asthma    Breast cancer ([place])    Diabetes ([place])    Dyspnea    Dysrhythmia    afibb on occasion   Family history of adverse reaction to anesthesia    mother had reaction to versed...during surgery, stopped breathing   Fibromyalgia    GERD (gastroesophageal reflux disease)    Headache    History of hiatal hernia    Hypertension    Migraines    Osteopenia    Psoriasis    Yeast infection 10/30/2021   Past Surgical History:  Procedure Laterality Date   ABDOMINAL HYSTERECTOMY     APPENDECTOMY     ATRIAL FIBRILLATION ABLATION N/A 12/10/2021   Procedure: ATRIAL FIBRILLATION ABLATION;  Surgeon: Constance Haw, MD;  Location: [place] [facility];  Service: Cardiovascular;  Laterality: N/A;   BREAST LUMPECTOMY WITH RADIOACTIVE SEED AND SENTINEL LYMPH NODE BIOPSY Right 02/18/2021   Procedure: RIGHT BREAST LUMPECTOMY WITH RADIOACTIVE SEED AND SENTINEL LYMPH NODE BIOPSY;  Surgeon: [name], MD;  Location: [place];  Service: General;  Laterality: Right;   broken wrist     CARDIOVERSION N/A 04/04/2021   Procedure: CARDIOVERSION;  Surgeon: Sueanne Margarita, MD;  Location: [facility];  Service: Cardiovascular;  Laterality: N/A;   CARDIOVERSION N/A 08/14/2021   Procedure: CARDIOVERSION;  Surgeon: Geralynn Rile, MD;  Location: [place];  Service: Cardiovascular;   Laterality: N/A;   CARDIOVERSION N/A 09/19/2021   Procedure: CARDIOVERSION;  Surgeon: Geralynn Rile, MD;  Location: [place];  Service: Cardiovascular;  Laterality: N/A;   [place]     right wrist   HYSTERECTOMY ABDOMINAL WITH SALPINGO-OOPHORECTOMY     LYSIS OF ADHESION     SHOULDER SURGERY Left    TUBAL LIGATION     Patient Active Problem List   Diagnosis Date Noted   Yeast infection 10/30/2021   Breast abscess    Sepsis ([place]) 10/12/2021   Fever 10/12/2021   Nausea 10/12/2021   Headache 10/12/2021   Generalized weakness 10/12/2021   Diabetes ([place])    Hypertension    GERD (gastroesophageal reflux disease)    Iron deficiency anemia 09/25/2021   Secondary hypercoagulable state ([place]) 07/26/2021   Persistent atrial fibrillation ([place])    Genetic testing 12/26/2020   Malignant neoplasm of upper-outer quadrant of right breast in female, estrogen receptor positive ([place]) 11/28/2020    REFERRING DIAG: Rt breast cancer, muscle weakness   THERAPY DIAG:  Muscle weakness (generalized)  Difficulty in walking, not elsewhere classified  Stiffness of right shoulder, not elsewhere classified  Abnormal posture  Malignant neoplasm of upper-outer quadrant of right breast in female, estrogen receptor positive (  [place])  Localized edema  Aftercare following surgery for neoplasm  PERTINENT HISTORY: Patient was diagnosed on 11/22/2020 with right invasive ductal carcinoma breast cancer. She underwent a right lumpectomy and sentinel node biopsy (5 negative nodes) on 02/18/2021. It is ER/PR positive and HER2 negative with a Ki67 of 10%. She has many orthopedic problems from a traumatic fall and other accidents, pt has had 3 cardioversions this year for atrial fib on 04/04/21, 08/14/21 and 09/19/21, has hx of rotator cuff surgery on the R   PRECAUTIONS: A-fib, recent cardioablation, lymphedema Risk Rt UE  SUBJECTIVE: My cardiologist  was happy with my status. I am in normal rhythm.    PAIN:  Are you having pain? No   TODAY'S TREATMENT:   Therapeutic Exercise: Nustep    LE only level 3 x 8 min with no SOB reported; seat at 8 LAQ    1# 2x10 Sit to Stand   2 sets of 5  Supine: SLR   Attempted but too difficult to lift   Clam red:  X10 Ball squeeze  X10   Parallel bars:     hip abd 1# 2x5 bil Hip flexion 2x5 bil Side steps over cones x 3 Rt/Lt with vcs on lightly holding the bar and to not hold breath  Neuromuscular Reeducation In parallel bars: standing on foam 2x30" with CGA minA with backwards LOB intermittently - education off of foam on hip strategy for preventing falls with demo Tandem stance work bil 3xmax with CGA   Clinical Impression Statement Pt had a good cardiologist visit and they have no concerns about her HR, SOB, and fatigue stating that they should all get better in the next 3 months.  Pt continues with Rt LE weakness and asked about a neurologist visit and her cardiologist says that she should wait a few months to let things settle.  Tolerated whole session well today with LOB backwards on foam noted multiple times.  Will continue POC   PT Frequency; 2x / week x 4 weeks   PT Treatment/Interventions  ADLs/Self Care Home Management;Therapeutic exercise;Patient/family education;Therapeutic activities;Neuromuscular re-education;Balance training;Gait training;Stair training;Manual techniques;Manual lymph drainage;Passive range of motion    PT Next Visit Plan:  Do SOZO if pt has not been in Afib x 22months ~04/07/22  cont LE strengthening and high level balance exercises    PT Home Exercise Plan  Access Code: [email or web address]     PT Long Term Goals - 01/02/22 0806       PT LONG TERM GOAL #1   Title Pt will demonstrate 4/5 bilateral hip flexion strength to decrease fall risk    Baseline R 3+, L 3-; 11/26/21- R 3- L 3+; 12/31/21- R 3-, L 4/5    Status Partially Met      PT LONG TERM GOAL #2   Title Pt  will demonstrate 4+/5 R knee flexion strength to decrease risk of falls    Baseline 3/5; 11/26/21- 3/5; 12/31/21- 4+/5    Status Achieved      PT LONG TERM GOAL #3   Title Pt will demonstrate 4+/5 bilateral  knee extensor strength to decrease risk of falls.    Baseline 3/5 R, 4/5 L; 12/31/21- R 4+/5    Status Achieved      PT LONG TERM GOAL #4   Title Pt will report she feels safe returning to work because she is not afraid of falling    Baseline 11/26/21- pt is still fearful of falling at work; 12/31/21- pt reports she  still has some hesitancy due to fear of falling    Status On-going      PT LONG TERM GOAL #5   Title Pt will be indepdent in a home exercise program for continued stretching and strengthening    Status On-going               Shan Levans, PT  01/08/22 5:07 PM

## 2022-01-14 ENCOUNTER — Other Ambulatory Visit: Payer: Self-pay

## 2022-01-14 ENCOUNTER — Encounter: Payer: Self-pay | Admitting: Physical Therapy

## 2022-01-14 ENCOUNTER — Ambulatory Visit: Payer: Medicare Other | Admitting: Physical Therapy

## 2022-01-14 DIAGNOSIS — C50411 Malignant neoplasm of upper-outer quadrant of right female breast: Secondary | ICD-10-CM

## 2022-01-14 DIAGNOSIS — R262 Difficulty in walking, not elsewhere classified: Secondary | ICD-10-CM | POA: Diagnosis not present

## 2022-01-14 DIAGNOSIS — R293 Abnormal posture: Secondary | ICD-10-CM

## 2022-01-14 DIAGNOSIS — M25611 Stiffness of right shoulder, not elsewhere classified: Secondary | ICD-10-CM

## 2022-01-14 DIAGNOSIS — M6281 Muscle weakness (generalized): Secondary | ICD-10-CM

## 2022-01-14 DIAGNOSIS — R600 Localized edema: Secondary | ICD-10-CM | POA: Diagnosis not present

## 2022-01-14 NOTE — Therapy (Signed)
OUTPATIENT PHYSICAL THERAPY TREATMENT NOTE   Patient Name: Kayla Bennett MRN: [id number] DOB:[birth year], 71 y.o., female Today's Date: 01/14/2022  PCP: [name], MD REFERRING PROVIDER: [name], MD   PT End of Session - 01/14/22 1000     Visit Number 14    Number of Visits 18    Date for PT Re-Evaluation 01/28/22    PT Start Time 0957    PT Stop Time 1046    PT Time Calculation (min) 49 min    Activity Tolerance Patient tolerated treatment well    Behavior During Therapy WFL for tasks assessed/performed              Past Medical History:  Diagnosis Date   Anemia    Arthritis    Asthma    Breast cancer ([place])    Diabetes ([place])    Dyspnea    Dysrhythmia    afibb on occasion   Family history of adverse reaction to anesthesia    mother had reaction to versed...during surgery, stopped breathing   Fibromyalgia    GERD (gastroesophageal reflux disease)    Headache    History of hiatal hernia    Hypertension    Migraines    Osteopenia    Psoriasis    Yeast infection 10/30/2021   Past Surgical History:  Procedure Laterality Date   ABDOMINAL HYSTERECTOMY     APPENDECTOMY     ATRIAL FIBRILLATION ABLATION N/A 12/10/2021   Procedure: ATRIAL FIBRILLATION ABLATION;  Surgeon: Constance Haw, MD;  Location: [facility];  Service: Cardiovascular;  Laterality: N/A;   BREAST LUMPECTOMY WITH RADIOACTIVE SEED AND SENTINEL LYMPH NODE BIOPSY Right 02/18/2021   Procedure: RIGHT BREAST LUMPECTOMY WITH RADIOACTIVE SEED AND SENTINEL LYMPH NODE BIOPSY;  Surgeon: [name], MD;  Location: [place];  Service: General;  Laterality: Right;   broken wrist     CARDIOVERSION N/A 04/04/2021   Procedure: CARDIOVERSION;  Surgeon: Sueanne Margarita, MD;  Location: [facility];  Service: Cardiovascular;  Laterality: N/A;   CARDIOVERSION N/A 08/14/2021   Procedure: CARDIOVERSION;  Surgeon: Geralynn Rile, MD;  Location: [place];  Service:  Cardiovascular;  Laterality: N/A;   CARDIOVERSION N/A 09/19/2021   Procedure: CARDIOVERSION;  Surgeon: Geralynn Rile, MD;  Location: [place];  Service: Cardiovascular;  Laterality: N/A;   [place]     right wrist   HYSTERECTOMY ABDOMINAL WITH SALPINGO-OOPHORECTOMY     LYSIS OF ADHESION     SHOULDER SURGERY Left    TUBAL LIGATION     Patient Active Problem List   Diagnosis Date Noted   Yeast infection 10/30/2021   Breast abscess    Sepsis ([place]) 10/12/2021   Fever 10/12/2021   Nausea 10/12/2021   Headache 10/12/2021   Generalized weakness 10/12/2021   Diabetes ([place])    Hypertension    GERD (gastroesophageal reflux disease)    Iron deficiency anemia 09/25/2021   Secondary hypercoagulable state ([place]) 07/26/2021   Persistent atrial fibrillation ([place])    Genetic testing 12/26/2020   Malignant neoplasm of upper-outer quadrant of right breast in female, estrogen receptor positive ([place]) 11/28/2020    REFERRING DIAG: Rt breast cancer, muscle weakness   THERAPY DIAG:  Muscle weakness (generalized)  Difficulty in walking, not elsewhere classified  Stiffness of right shoulder, not elsewhere classified  Abnormal posture  Malignant neoplasm of upper-outer quadrant of right breast in female, estrogen receptor  positive ([place])  PERTINENT HISTORY: Patient was diagnosed on 11/22/2020 with right invasive ductal carcinoma breast cancer. She underwent a right lumpectomy and sentinel node biopsy (5 negative nodes) on 02/18/2021. It is ER/PR positive and HER2 negative with a Ki67 of 10%. She has many orthopedic problems from a traumatic fall and other accidents, pt has had 3 cardioversions this year for atrial fib on 04/04/21, 08/14/21 and 09/19/21, has hx of rotator cuff surgery on the R   PRECAUTIONS: A-fib, recent cardioablation, lymphedema Risk Rt UE  SUBJECTIVE: When I woke up I felt ok but then around 8 I started feeling  achy. I think that is from my fibromyalgia.   PAIN:  Are you having pain? No   TODAY'S TREATMENT 01/14/22:   Therapeutic Exercise: Nustep    LE only level 3 x 8 min with no SOB reported; seat at 6 LAQ    1# 2x10 - pt reports increased fatigue today Marching  1# 2x10 - pt reports difficulty with this Sit to Stand   2 sets of 5   Clam red:  X10 Ball squeeze  X10   Parallel bars:     hip abd  standing on foam x 10 reps bilat Hip flexion standing on foam x 10 reps bilat Hip extension standing on foam x 10 reps bilat  Neuromuscular Reeducation In parallel bars:  Braiding x 4 times in // bars with some difficulty with placing foot behind Heel/toe x 4 times Backwards walking x 4 times Standing on foam with eyes open for 2 min with occasional CGA Standing on foam withes eyes closed for 1 min with max CGA for balance  TODAY'S TREATMENT 01/08/22:   Therapeutic Exercise: Nustep    LE only level 3 x 8 min with no SOB reported; seat at 6 LAQ    1# 2x10 Sit to Stand   2 sets of 5  Supine: SLR   Attempted but too difficult to lift   Clam red:  X10 Ball squeeze  X10   Parallel bars:     hip abd 1# 2x5 bil Hip flexion 2x5 bil Side steps over cones x 3 Rt/Lt with vcs on lightly holding the bar and to not hold breath  Neuromuscular Reeducation In parallel bars: standing on foam 2x30" with CGA minA with backwards LOB intermittently - education off of foam on hip strategy for preventing falls with demo Tandem stance work bil 3xmax with CGA   Clinical Impression Statement PT reports increased fatigue today. She did very well with her exercises today. Her balance is improving but she is still very challenged with tandem stance and standing on uneven surfaces. She also fatigues still with long arc quds and seated marches. Today she demonstrated increased compensatory strategies with these exercises.    PT Frequency; 2x / week x 4 weeks   PT Treatment/Interventions  ADLs/Self Care Home  Management;Therapeutic exercise;Patient/family education;Therapeutic activities;Neuromuscular re-education;Balance training;Gait training;Stair training;Manual techniques;Manual lymph drainage;Passive range of motion    PT Next Visit Plan:  Do SOZO if pt has not been in Afib x 9month ~04/07/22  cont LE strengthening and high level balance exercises    PT Home Exercise Plan  Access Code: [email or web address]    PT Long Term Goals - 01/02/22 0806       PT LONG TERM GOAL #1   Title Pt will demonstrate 4/5 bilateral hip flexion strength to decrease fall risk    Baseline R 3+, L 3-; 11/26/21- R 3- L 3+; 12/31/21- R  3-, L 4/5    Status Partially Met      PT LONG TERM GOAL #2   Title Pt will demonstrate 4+/5 R knee flexion strength to decrease risk of falls    Baseline 3/5; 11/26/21- 3/5; 12/31/21- 4+/5    Status Achieved      PT LONG TERM GOAL #3   Title Pt will demonstrate 4+/5 bilateral  knee extensor strength to decrease risk of falls.    Baseline 3/5 R, 4/5 L; 12/31/21- R 4+/5    Status Achieved      PT LONG TERM GOAL #4   Title Pt will report she feels safe returning to work because she is not afraid of falling    Baseline 11/26/21- pt is still fearful of falling at work; 12/31/21- pt reports she still has some hesitancy due to fear of falling    Status On-going      PT LONG TERM GOAL #5   Title Pt will be indepdent in a home exercise program for continued stretching and strengthening    Status On-going               [employer], PT 01/14/22 10:48 AM

## 2022-01-16 ENCOUNTER — Encounter: Payer: Self-pay | Admitting: Physical Therapy

## 2022-01-16 ENCOUNTER — Ambulatory Visit: Payer: Medicare Other | Attending: General Surgery | Admitting: Physical Therapy

## 2022-01-16 ENCOUNTER — Other Ambulatory Visit: Payer: Self-pay

## 2022-01-16 DIAGNOSIS — R262 Difficulty in walking, not elsewhere classified: Secondary | ICD-10-CM | POA: Diagnosis not present

## 2022-01-16 DIAGNOSIS — C50411 Malignant neoplasm of upper-outer quadrant of right female breast: Secondary | ICD-10-CM | POA: Diagnosis not present

## 2022-01-16 DIAGNOSIS — M6281 Muscle weakness (generalized): Secondary | ICD-10-CM | POA: Diagnosis not present

## 2022-01-16 DIAGNOSIS — R600 Localized edema: Secondary | ICD-10-CM | POA: Insufficient documentation

## 2022-01-16 DIAGNOSIS — M25611 Stiffness of right shoulder, not elsewhere classified: Secondary | ICD-10-CM | POA: Insufficient documentation

## 2022-01-16 DIAGNOSIS — R293 Abnormal posture: Secondary | ICD-10-CM | POA: Insufficient documentation

## 2022-01-16 DIAGNOSIS — Z483 Aftercare following surgery for neoplasm: Secondary | ICD-10-CM | POA: Diagnosis not present

## 2022-01-16 DIAGNOSIS — Z170 Estrogen receptor positive status [ER+]: Secondary | ICD-10-CM | POA: Diagnosis not present

## 2022-01-16 NOTE — Therapy (Signed)
?OUTPATIENT PHYSICAL THERAPY TREATMENT NOTE ? ? ?Patient Name: LEISA GAULT ?MRN: [id number] ?DOB:27-Oct-1951, 71 y.o., female ?Today's Date: 01/16/2022 ? ?PCP: [name], MD ?REFERRING PROVIDER: [name], MD ? ? PT End of Session - 01/16/22 1310   ? ? Visit Number 15   ? Number of Visits 18   ? Date for PT Re-Evaluation 01/28/22   ? PT Start Time 1308   ? PT Stop Time 9390   ? PT Time Calculation (min) 43 min   ? Activity Tolerance Patient tolerated treatment well   ? Behavior During Therapy [facility] for tasks assessed/performed   ? ?  ?  ? ?  ? ? ? ?Past Medical History:  ?Diagnosis Date  ? Anemia   ? Arthritis   ? Asthma   ? Breast cancer ([place])   ? Diabetes ([place])   ? Dyspnea   ? Dysrhythmia   ? afibb on occasion  ? Family history of adverse reaction to anesthesia   ? mother had reaction to versed...during surgery, stopped breathing  ? Fibromyalgia   ? GERD (gastroesophageal reflux disease)   ? Headache   ? History of hiatal hernia   ? Hypertension   ? Migraines   ? Osteopenia   ? Psoriasis   ? Yeast infection 10/30/2021  ? ?Past Surgical History:  ?Procedure Laterality Date  ? ABDOMINAL HYSTERECTOMY    ? APPENDECTOMY    ? ATRIAL FIBRILLATION ABLATION N/A 12/10/2021  ? Procedure: ATRIAL FIBRILLATION ABLATION;  Surgeon: Constance Haw, MD;  Location: [place] [facility];  Service: Cardiovascular;  Laterality: N/A;  ? BREAST LUMPECTOMY WITH RADIOACTIVE SEED AND SENTINEL LYMPH NODE BIOPSY Right 02/18/2021  ? Procedure: RIGHT BREAST LUMPECTOMY WITH RADIOACTIVE SEED AND SENTINEL LYMPH NODE BIOPSY;  Surgeon: [name], MD;  Location: [place];  Service: General;  Laterality: Right;  ? broken wrist    ? CARDIOVERSION N/A 04/04/2021  ? Procedure: CARDIOVERSION;  Surgeon: Sueanne Margarita, MD;  Location: [place];  Service: Cardiovascular;  Laterality: N/A;  ? CARDIOVERSION N/A 08/14/2021  ? Procedure: CARDIOVERSION;  Surgeon: Geralynn Rile, MD;  Location: [place];  Service: Cardiovascular;   Laterality: N/A;  ? CARDIOVERSION N/A 09/19/2021  ? Procedure: CARDIOVERSION;  Surgeon: Geralynn Rile, MD;  Location: [place];  Service: Cardiovascular;  Laterality: N/A;  ? [place]  ? CHOLECYSTECTOMY    ? FRACTURE SURGERY    ? right wrist  ? HYSTERECTOMY ABDOMINAL WITH SALPINGO-OOPHORECTOMY    ? LYSIS OF ADHESION    ? SHOULDER SURGERY Left   ? TUBAL LIGATION    ? ?Patient Active Problem List  ? Diagnosis Date Noted  ? Yeast infection 10/30/2021  ? Breast abscess   ? Sepsis ([place]) 10/12/2021  ? Fever 10/12/2021  ? Nausea 10/12/2021  ? Headache 10/12/2021  ? Generalized weakness 10/12/2021  ? Diabetes ([place])   ? Hypertension   ? GERD (gastroesophageal reflux disease)   ? Iron deficiency anemia 09/25/2021  ? Secondary hypercoagulable state ([place]) 07/26/2021  ? Persistent atrial fibrillation ([place])   ? Genetic testing 12/26/2020  ? Malignant neoplasm of upper-outer quadrant of right breast in female, estrogen receptor positive ([place]) 11/28/2020  ? ? ?REFERRING DIAG: Rt breast cancer, muscle weakness  ? ?THERAPY DIAG:  ?Muscle weakness (generalized) ? ?Difficulty in walking, not elsewhere classified ? ?Stiffness of right shoulder, not elsewhere classified ? ?Abnormal posture ? ?Malignant neoplasm of upper-outer quadrant of right breast in female, estrogen receptor positive (  [place]) ? ?PERTINENT HISTORY: Patient was diagnosed on 11/22/2020 with right invasive ductal carcinoma breast cancer. She underwent a right lumpectomy and sentinel node biopsy (5 negative nodes) on 02/18/2021. It is ER/PR positive and HER2 negative with a Ki67 of 10%. She has many orthopedic problems from a traumatic fall and other accidents, pt has had 3 cardioversions this year for atrial fib on 04/04/21, 08/14/21 and 09/19/21, has hx of rotator cuff surgery on the R  ? ?PRECAUTIONS: A-fib, recent cardioablation, lymphedema Risk Rt UE ? ?SUBJECTIVE: I feel better today than last time.  ? ?PAIN:  ?Are you having pain?  No ? ?TODAY'S TREATMENT 01/16/22:  ? ?Therapeutic Exercise: ?Nustep    LE only level 3 x 8 min with no SOB reported; seat at 6 ?LAQ    2# 4x 5 bilat- pt reports increased fatigue today ?Marching  2# 4x 5 - pt reports difficulty with this ?Ball squeeze  X10 ? ?Supine: bridging x 10 reps with v/c to keep core engaged, SLR x 10 reps bilaterally ?Sidelying: hip abduction x 10 reps bilaterally ? ?Neuromuscular Reeducation ?In parallel bars:  ?Braiding x 4 times in // bars with some difficulty with placing foot behind ?Heel/toe x 4 times ?Walking on heels x 4 with HHA ?Walking on toex x 4 with HHA ? ? ? ?TODAY'S TREATMENT 01/14/22:  ? ?Therapeutic Exercise: ?Nustep    LE only level 3 x 8 min with no SOB reported; seat at 6 ?LAQ    1# 2x10 - pt reports increased fatigue today ?Marching  1# 2x10 - pt reports difficulty with this ?Sit to Stand   2 sets of 5   ?Clam red:  X10 ?Ball squeeze  X10 ? ? ?Parallel bars:     ?hip abd  standing on foam x 10 reps bilat ?Hip flexion standing on foam x 10 reps bilat ?Hip extension standing on foam x 10 reps bilat ? ?Neuromuscular Reeducation ?In parallel bars:  ?Braiding x 4 times in // bars with some difficulty with placing foot behind ?Heel/toe x 4 times ?Backwards walking x 4 times ?Standing on foam with eyes open for 2 min with occasional CGA ?Standing on foam withes eyes closed for 1 min with max CGA for balance ? ?TODAY'S TREATMENT 01/08/22:  ? ?Therapeutic Exercise: ?Nustep    LE only level 3 x 8 min with no SOB reported; seat at 6 ?LAQ    1# 2x10 ?Sit to Stand   2 sets of 5  ?Supine: ?SLR   Attempted but too difficult to lift   ?Clam red:  X10 ?Ball squeeze  X10 ? ? ?Parallel bars:     ?hip abd 1# 2x5 bil ?Hip flexion 2x5 bil ?Side steps over cones x 3 Rt/Lt with vcs on lightly holding the bar and to not hold breath ? ?Neuromuscular Reeducation ?In parallel bars: standing on foam 2x30" with CGA minA with backwards LOB intermittently - education off of foam on hip strategy for  preventing falls with demo ?Tandem stance work bil 3xmax with CGA ? ? ?Clinical Impression Statement ?Pt reports feeling less fatigued today. She was able to tolerate increased resistance from 1lb ankle weights to 2 lbs ankle weights. She was also able to complete a straight leg raise which she was unable to just last week. Pt encouraged to continue her home exercise program to continue LE strengthening.  ?  ?PT Frequency; 2x / week x 4 weeks ?  ?PT Treatment/Interventions  ADLs/Self Care Home Management;Therapeutic exercise;Patient/family education;Therapeutic  activities;Neuromuscular re-education;Balance training;Gait training;Stair training;Manual techniques;Manual lymph drainage;Passive range of motion  ?  ?PT Next Visit Plan:  Do SOZO if pt has not been in Afib x 25months ~04/07/22  cont LE strengthening and high level balance exercises  ?  ?PT Home Exercise Plan  Access Code: [email or web address]  ? ? ? PT Long Term Goals - 01/02/22 0806   ? ?  ? PT LONG TERM GOAL #1  ? Title Pt will demonstrate 4/5 bilateral hip flexion strength to decrease fall risk   ? Baseline R 3+, L 3-; 11/26/21- R 3- L 3+; 12/31/21- R 3-, L 4/5   ? Status Partially Met   ?  ? PT LONG TERM GOAL #2  ? Title Pt will demonstrate 4+/5 R knee flexion strength to decrease risk of falls   ? Baseline 3/5; 11/26/21- 3/5; 12/31/21- 4+/5   ? Status Achieved   ?  ? PT LONG TERM GOAL #3  ? Title Pt will demonstrate 4+/5 bilateral  knee extensor strength to decrease risk of falls.   ? Baseline 3/5 R, 4/5 L; 12/31/21- R 4+/5   ? Status Achieved   ?  ? PT LONG TERM GOAL #4  ? Title Pt will report she feels safe returning to work because she is not afraid of falling   ? Baseline 11/26/21- pt is still fearful of falling at work; 12/31/21- pt reports she still has some hesitancy due to fear of falling   ? Status On-going   ?  ? PT LONG TERM GOAL #5  ? Title Pt will be indepdent in a home exercise program for continued stretching and strengthening   ? Status On-going   ? ?  ?   ? ?  ? ? ? ? ?Allyson Sabal [place], PT ?01/16/22 2:05 PM ? ? ? ?   ?

## 2022-01-20 ENCOUNTER — Encounter: Payer: Self-pay | Admitting: Physical Therapy

## 2022-01-20 ENCOUNTER — Ambulatory Visit: Payer: Medicare Other | Admitting: Physical Therapy

## 2022-01-20 ENCOUNTER — Other Ambulatory Visit: Payer: Self-pay

## 2022-01-20 DIAGNOSIS — R262 Difficulty in walking, not elsewhere classified: Secondary | ICD-10-CM

## 2022-01-20 DIAGNOSIS — M6281 Muscle weakness (generalized): Secondary | ICD-10-CM | POA: Diagnosis not present

## 2022-01-20 DIAGNOSIS — C50411 Malignant neoplasm of upper-outer quadrant of right female breast: Secondary | ICD-10-CM

## 2022-01-20 DIAGNOSIS — M25611 Stiffness of right shoulder, not elsewhere classified: Secondary | ICD-10-CM

## 2022-01-20 DIAGNOSIS — R293 Abnormal posture: Secondary | ICD-10-CM

## 2022-01-20 DIAGNOSIS — Z170 Estrogen receptor positive status [ER+]: Secondary | ICD-10-CM | POA: Diagnosis not present

## 2022-01-20 NOTE — Therapy (Signed)
OUTPATIENT PHYSICAL THERAPY TREATMENT NOTE   Patient Name: Kayla Bennett MRN: [id number] DOB:Apr 11, 1951, 71 y.o., female Today's Date: 01/20/2022  PCP: [name], MD REFERRING PROVIDER: [name], MD   PT End of Session - 01/20/22 1308     Visit Number 16   add kx   Number of Visits 18    Date for PT Re-Evaluation 01/28/22    PT Start Time 3151    Activity Tolerance Patient tolerated treatment well    Behavior During Therapy [facility] for tasks assessed/performed              Past Medical History:  Diagnosis Date   Anemia    Arthritis    Asthma    Breast cancer ([place])    Diabetes ([place])    Dyspnea    Dysrhythmia    afibb on occasion   Family history of adverse reaction to anesthesia    mother had reaction to versed...during surgery, stopped breathing   Fibromyalgia    GERD (gastroesophageal reflux disease)    Headache    History of hiatal hernia    Hypertension    Migraines    Osteopenia    Psoriasis    Yeast infection 10/30/2021   Past Surgical History:  Procedure Laterality Date   ABDOMINAL HYSTERECTOMY     APPENDECTOMY     ATRIAL FIBRILLATION ABLATION N/A 12/10/2021   Procedure: ATRIAL FIBRILLATION ABLATION;  Surgeon: Constance Haw, MD;  Location: [place] [facility];  Service: Cardiovascular;  Laterality: N/A;   BREAST LUMPECTOMY WITH RADIOACTIVE SEED AND SENTINEL LYMPH NODE BIOPSY Right 02/18/2021   Procedure: RIGHT BREAST LUMPECTOMY WITH RADIOACTIVE SEED AND SENTINEL LYMPH NODE BIOPSY;  Surgeon: [name], MD;  Location: [place];  Service: General;  Laterality: Right;   broken wrist     CARDIOVERSION N/A 04/04/2021   Procedure: CARDIOVERSION;  Surgeon: Sueanne Margarita, MD;  Location: [place] [facility];  Service: Cardiovascular;  Laterality: N/A;   CARDIOVERSION N/A 08/14/2021   Procedure: CARDIOVERSION;  Surgeon: Geralynn Rile, MD;  Location: [place];  Service: Cardiovascular;  Laterality: N/A;   CARDIOVERSION N/A 09/19/2021    Procedure: CARDIOVERSION;  Surgeon: Geralynn Rile, MD;  Location: [place];  Service: Cardiovascular;  Laterality: N/A;   [place]     right wrist   HYSTERECTOMY ABDOMINAL WITH SALPINGO-OOPHORECTOMY     LYSIS OF ADHESION     SHOULDER SURGERY Left    TUBAL LIGATION     Patient Active Problem List   Diagnosis Date Noted   Yeast infection 10/30/2021   Breast abscess    Sepsis ([place]) 10/12/2021   Fever 10/12/2021   Nausea 10/12/2021   Headache 10/12/2021   Generalized weakness 10/12/2021   Diabetes ([place])    Hypertension    GERD (gastroesophageal reflux disease)    Iron deficiency anemia 09/25/2021   Secondary hypercoagulable state ([place]) 07/26/2021   Persistent atrial fibrillation ([place])    Genetic testing 12/26/2020   Malignant neoplasm of upper-outer quadrant of right breast in female, estrogen receptor positive ([place]) 11/28/2020    REFERRING DIAG: Rt breast cancer, muscle weakness   THERAPY DIAG:  Muscle weakness (generalized)  Difficulty in walking, not elsewhere classified  Stiffness of right shoulder, not elsewhere classified  Abnormal posture  Malignant neoplasm of upper-outer quadrant of right breast in female, estrogen receptor positive ([place])  PERTINENT HISTORY: Patient was diagnosed on 11/22/2020 with right invasive ductal  carcinoma breast cancer. She underwent a right lumpectomy and sentinel node biopsy (5 negative nodes) on 02/18/2021. It is ER/PR positive and HER2 negative with a Ki67 of 10%. She has many orthopedic problems from a traumatic fall and other accidents, pt has had 3 cardioversions this year for atrial fib on 04/04/21, 08/14/21 and 09/19/21, has hx of rotator cuff surgery on the R   PRECAUTIONS: A-fib, recent cardioablation, lymphedema Risk Rt UE  SUBJECTIVE: II will get the CPAP on the 15th of March. I hope this will help with the fatigue.   PAIN:  Are you having pain? No  TODAY'S  TREATMENT 3/623:   Therapeutic Exercise: Nustep    LE only level 3 x 8 min with no SOB reported; seat at 6;  LAQ    2# 2x 10 bilat- pt reports increased fatigue today Marching  2# 2x 10 - pt reports difficulty with this Ball squeeze  X10  Supine: bridging x 10 reps with v/c to keep core engaged, SLR x 10 reps bilaterally, SAQ 2# x 10 reps bilaterally Sidelying: hip abduction x 10 reps bilaterally  Neuromuscular Reeducation In parallel bars:  Braiding x 4 times in // bars with some difficulty with placing foot behind Heel/toe x 4 times Walking on heels x 4 with HHA Walking on toex x 4 with HHA  TREATMENT 01/16/22:   Therapeutic Exercise: Nustep    LE only level 3 x 8 min with no SOB reported; seat at 6 LAQ    2# 4x 5 bilat- pt reports increased fatigue today Marching  2# 4x 5 - pt reports difficulty with this Ball squeeze  X10  Supine: bridging x 10 reps with v/c to keep core engaged, SLR x 10 reps bilaterally Sidelying: hip abduction x 10 reps bilaterally  Neuromuscular Reeducation In parallel bars:  Braiding x 4 times in // bars with pt demonstrating improved balance today Heel/toe x 4 times Standing with 1 foot on air ex and doing 3 way hip with opposite foot x 10 x in flexion, extension and abduction then did the opposite side with pt only requiring min cueing for keeping knee straight with extension    TREATMENT 01/14/22:   Therapeutic Exercise: Nustep    LE only level 3 x 8 min with no SOB reported; seat at 6 LAQ    1# 2x10 - pt reports increased fatigue today Marching  1# 2x10 - pt reports difficulty with this Sit to Stand   2 sets of 5   Clam red:  X10 Ball squeeze  X10   Parallel bars:     hip abd  standing on foam x 10 reps bilat Hip flexion standing on foam x 10 reps bilat Hip extension standing on foam x 10 reps bilat  Neuromuscular Reeducation In parallel bars:  Braiding x 4 times in // bars with some difficulty with placing foot behind Heel/toe x 4  times Backwards walking x 4 times Standing on foam with eyes open for 2 min with occasional CGA Standing on foam withes eyes closed for 1 min with max CGA for balance   Clinical Impression Statement Pt  demonstrated improved balance with braiding today in the parallel bars. Did not increased weight today since that was done last session but pt was able to tolerate 2 sets of 10 instead of 4 sets of 5. She still is having trouble with getting off balance after initially standing up or changing position and at times demonstrates increased posterior sway when standing.  Pt would benefit from additional skilled PT visits to continue to improve strength and balance. Pt finds strengthening exercises still moderately challenging.  PT Frequency; 2x / week x 4 weeks   PT Treatment/Interventions  ADLs/Self Care Home Management;Therapeutic exercise;Patient/family education;Therapeutic activities;Neuromuscular re-education;Balance training;Gait training;Stair training;Manual techniques;Manual lymph drainage;Passive range of motion    PT Next Visit Plan:  Do SOZO if pt has not been in Afib x 61months ~04/07/22  cont LE strengthening and high level balance exercises    PT Home Exercise Plan  Access Code: [email or web address]     PT Long Term Goals - 01/02/22 0806       PT LONG TERM GOAL #1   Title Pt will demonstrate 4/5 bilateral hip flexion strength to decrease fall risk    Baseline R 3+, L 3-; 11/26/21- R 3- L 3+; 12/31/21- R 3-, L 4/5    Status Partially Met      PT LONG TERM GOAL #2   Title Pt will demonstrate 4+/5 R knee flexion strength to decrease risk of falls    Baseline 3/5; 11/26/21- 3/5; 12/31/21- 4+/5    Status Achieved      PT LONG TERM GOAL #3   Title Pt will demonstrate 4+/5 bilateral  knee extensor strength to decrease risk of falls.    Baseline 3/5 R, 4/5 L; 12/31/21- R 4+/5    Status Achieved      PT LONG TERM GOAL #4   Title Pt will report she feels safe returning to work because she is not  afraid of falling    Baseline 11/26/21- pt is still fearful of falling at work; 12/31/21- pt reports she still has some hesitancy due to fear of falling    Status On-going      PT LONG TERM GOAL #5   Title Pt will be indepdent in a home exercise program for continued stretching and strengthening    Status On-going               [employer], PT 01/20/22 1:10 PM

## 2022-01-23 ENCOUNTER — Ambulatory Visit: Payer: Medicare Other | Admitting: Physical Therapy

## 2022-01-23 ENCOUNTER — Encounter: Payer: Self-pay | Admitting: Physical Therapy

## 2022-01-23 ENCOUNTER — Other Ambulatory Visit: Payer: Self-pay

## 2022-01-23 DIAGNOSIS — Z170 Estrogen receptor positive status [ER+]: Secondary | ICD-10-CM | POA: Diagnosis not present

## 2022-01-23 DIAGNOSIS — M6281 Muscle weakness (generalized): Secondary | ICD-10-CM | POA: Diagnosis not present

## 2022-01-23 DIAGNOSIS — M25611 Stiffness of right shoulder, not elsewhere classified: Secondary | ICD-10-CM

## 2022-01-23 DIAGNOSIS — R262 Difficulty in walking, not elsewhere classified: Secondary | ICD-10-CM | POA: Diagnosis not present

## 2022-01-23 DIAGNOSIS — R293 Abnormal posture: Secondary | ICD-10-CM

## 2022-01-23 DIAGNOSIS — C50411 Malignant neoplasm of upper-outer quadrant of right female breast: Secondary | ICD-10-CM | POA: Diagnosis not present

## 2022-01-23 NOTE — Therapy (Signed)
OUTPATIENT PHYSICAL THERAPY TREATMENT NOTE   Patient Name: Kayla Bennett MRN: [id number] DOB:1951-01-02, 71 y.o., female Today's Date: 01/23/2022  PCP: [name], MD REFERRING PROVIDER: [name], MD   PT End of Session - 01/23/22 1108     Visit Number 35   kx added   Number of Visits 18    Date for PT Re-Evaluation 01/28/22    PT Start Time 1106    PT Stop Time 1153    PT Time Calculation (min) 47 min    Activity Tolerance Patient tolerated treatment well    Behavior During Therapy WFL for tasks assessed/performed              Past Medical History:  Diagnosis Date   Anemia    Arthritis    Asthma    Breast cancer ([place])    Diabetes ([place])    Dyspnea    Dysrhythmia    afibb on occasion   Family history of adverse reaction to anesthesia    mother had reaction to versed...during surgery, stopped breathing   Fibromyalgia    GERD (gastroesophageal reflux disease)    Headache    History of hiatal hernia    Hypertension    Migraines    Osteopenia    Psoriasis    Yeast infection 10/30/2021   Past Surgical History:  Procedure Laterality Date   ABDOMINAL HYSTERECTOMY     APPENDECTOMY     ATRIAL FIBRILLATION ABLATION N/A 12/10/2021   Procedure: ATRIAL FIBRILLATION ABLATION;  Surgeon: Constance Haw, MD;  Location: [place] [facility];  Service: Cardiovascular;  Laterality: N/A;   BREAST LUMPECTOMY WITH RADIOACTIVE SEED AND SENTINEL LYMPH NODE BIOPSY Right 02/18/2021   Procedure: RIGHT BREAST LUMPECTOMY WITH RADIOACTIVE SEED AND SENTINEL LYMPH NODE BIOPSY;  Surgeon: [name], MD;  Location: [place];  Service: General;  Laterality: Right;   broken wrist     CARDIOVERSION N/A 04/04/2021   Procedure: CARDIOVERSION;  Surgeon: Sueanne Margarita, MD;  Location: [place] [facility];  Service: Cardiovascular;  Laterality: N/A;   CARDIOVERSION N/A 08/14/2021   Procedure: CARDIOVERSION;  Surgeon: Geralynn Rile, MD;  Location: [place];  Service:  Cardiovascular;  Laterality: N/A;   CARDIOVERSION N/A 09/19/2021   Procedure: CARDIOVERSION;  Surgeon: Geralynn Rile, MD;  Location: [place];  Service: Cardiovascular;  Laterality: N/A;   [place]     right wrist   HYSTERECTOMY ABDOMINAL WITH SALPINGO-OOPHORECTOMY     LYSIS OF ADHESION     SHOULDER SURGERY Left    TUBAL LIGATION     Patient Active Problem List   Diagnosis Date Noted   Yeast infection 10/30/2021   Breast abscess    Sepsis ([place]) 10/12/2021   Fever 10/12/2021   Nausea 10/12/2021   Headache 10/12/2021   Generalized weakness 10/12/2021   Diabetes ([place])    Hypertension    GERD (gastroesophageal reflux disease)    Iron deficiency anemia 09/25/2021   Secondary hypercoagulable state ([place]) 07/26/2021   Persistent atrial fibrillation ([place])    Genetic testing 12/26/2020   Malignant neoplasm of upper-outer quadrant of right breast in female, estrogen receptor positive ([place]) 11/28/2020    REFERRING DIAG: Rt breast cancer, muscle weakness   THERAPY DIAG:  Muscle weakness (generalized)  Difficulty in walking, not elsewhere classified  Stiffness of right shoulder, not elsewhere classified  Abnormal posture  Malignant neoplasm of upper-outer quadrant of right breast in female,  estrogen receptor positive ([place])  PERTINENT HISTORY: Patient was diagnosed on 11/22/2020 with right invasive ductal carcinoma breast cancer. She underwent a right lumpectomy and sentinel node biopsy (5 negative nodes) on 02/18/2021. It is ER/PR positive and HER2 negative with a Ki67 of 10%. She has many orthopedic problems from a traumatic fall and other accidents, pt has had 3 cardioversions this year for atrial fib on 04/04/21, 08/14/21 and 09/19/21, has hx of rotator cuff surgery on the R   PRECAUTIONS: A-fib, recent cardioablation, lymphedema Risk Rt UE  SUBJECTIVE: I am having a good day today. I have had more energy but I  don't feel my balance has changed. I feel like it makes me feel better to get out and work. I have been working more.  PAIN:  Are you having pain? No  TODAY'S TREATMENT 01/23/22:   Therapeutic Exercise: Nustep    LE only level 4 x 10 min with no SOB reported; seat at 7; Steps: 543 LAQ    2# 2x 10 bilat- pt reports increased fatigue today Marching  2# 2x 10 - pt reports difficulty with this Ball squeeze  X10 Sit to stands   x 2 sets of 10 reps  Supine: bridging x 10 reps with v/c to keep core engaged, SLR x 10 reps bilaterally with increased fatigue at 8 reps Sidelying: hip abduction x 10 reps bilaterally with v/c to keep hip straight  Neuromuscular Reeducation In parallel bars:  Heel/toe walking on air ex beam x 5 with hand held assist on bars for balance Standing in tandem stance x 2 min with increased difficulty Standing in tandem with eyes closed x 10 min with increased sway Standing in near tandem with eyes closed with increased sway but easier than in tandem  SLS x 5 reps each   TREATMENT 3/623:   Therapeutic Exercise: Nustep    LE only level 3 x 8 min with no SOB reported; seat at 6;  LAQ    2# 2x 10 bilat- pt reports increased fatigue today Marching  2# 2x 10 - pt reports difficulty with this Ball squeeze  X10  Supine: bridging x 10 reps with v/c to keep core engaged, SLR x 10 reps bilaterally, SAQ 2# x 10 reps bilaterally Sidelying: hip abduction x 10 reps bilaterally  Neuromuscular Reeducation In parallel bars:  Braiding x 4 times in // bars with some difficulty with placing foot behind Heel/toe x 4 times Walking on heels x 4 with HHA Walking on toex x 4 with HHA  TREATMENT 01/16/22:   Therapeutic Exercise: Nustep    LE only level 3 x 8 min with no SOB reported; seat at 6 LAQ    2# 4x 5 bilat- pt reports increased fatigue today Marching  2# 4x 5 - pt reports difficulty with this Ball squeeze  X10  Supine: bridging x 10 reps with v/c to keep core engaged, SLR x  10 reps bilaterally Sidelying: hip abduction x 10 reps bilaterally  Neuromuscular Reeducation In parallel bars:  Braiding x 4 times in // bars with pt demonstrating improved balance today Heel/toe x 4 times Standing with 1 foot on air ex and doing 3 way hip with opposite foot x 10 x in flexion, extension and abduction then did the opposite side with pt only requiring min cueing for keeping knee straight with extension      Clinical Impression Statement  Continued to work on improving strength. Pt reports she has had two good days in a  row where she has felt like she had more energy though she still feels off balance. Continued with 2 lb ankle weights for exercises today as pt feels this is still challenging. Pt was able to tolerate NuStep for 2 additional minutes on level 4 instead of level 3. Pt is demonstrating improving SLS and tandem stance but still has difficulty and would benefit from continued high level balance and strengthening exercises to reduce fall risk.   PT Frequency; 2x / week x 4 weeks   PT Treatment/Interventions  ADLs/Self Care Home Management;Therapeutic exercise;Patient/family education;Therapeutic activities;Neuromuscular re-education;Balance training;Gait training;Stair training;Manual techniques;Manual lymph drainage;Passive range of motion    PT Next Visit Plan:  Do SOZO if pt has not been in Afib x 52months ~04/07/22  cont LE strengthening and high level balance exercises    PT Home Exercise Plan  Access Code: [email or web address]     PT Long Term Goals - 01/02/22 0806       PT LONG TERM GOAL #1   Title Pt will demonstrate 4/5 bilateral hip flexion strength to decrease fall risk    Baseline R 3+, L 3-; 11/26/21- R 3- L 3+; 12/31/21- R 3-, L 4/5    Status Partially Met      PT LONG TERM GOAL #2   Title Pt will demonstrate 4+/5 R knee flexion strength to decrease risk of falls    Baseline 3/5; 11/26/21- 3/5; 12/31/21- 4+/5    Status Achieved      PT LONG TERM GOAL #3    Title Pt will demonstrate 4+/5 bilateral  knee extensor strength to decrease risk of falls.    Baseline 3/5 R, 4/5 L; 12/31/21- R 4+/5    Status Achieved      PT LONG TERM GOAL #4   Title Pt will report she feels safe returning to work because she is not afraid of falling    Baseline 11/26/21- pt is still fearful of falling at work; 12/31/21- pt reports she still has some hesitancy due to fear of falling    Status On-going      PT LONG TERM GOAL #5   Title Pt will be indepdent in a home exercise program for continued stretching and strengthening    Status On-going               [employer], PT 01/23/22 12:01 PM

## 2022-01-27 ENCOUNTER — Ambulatory Visit: Payer: Medicare Other | Admitting: Rehabilitation

## 2022-01-27 ENCOUNTER — Encounter: Payer: Self-pay | Admitting: Rehabilitation

## 2022-01-27 ENCOUNTER — Other Ambulatory Visit: Payer: Self-pay

## 2022-01-27 DIAGNOSIS — C50411 Malignant neoplasm of upper-outer quadrant of right female breast: Secondary | ICD-10-CM | POA: Diagnosis not present

## 2022-01-27 DIAGNOSIS — Z483 Aftercare following surgery for neoplasm: Secondary | ICD-10-CM

## 2022-01-27 DIAGNOSIS — M25611 Stiffness of right shoulder, not elsewhere classified: Secondary | ICD-10-CM

## 2022-01-27 DIAGNOSIS — R600 Localized edema: Secondary | ICD-10-CM

## 2022-01-27 DIAGNOSIS — M6281 Muscle weakness (generalized): Secondary | ICD-10-CM | POA: Diagnosis not present

## 2022-01-27 DIAGNOSIS — R262 Difficulty in walking, not elsewhere classified: Secondary | ICD-10-CM | POA: Diagnosis not present

## 2022-01-27 DIAGNOSIS — Z170 Estrogen receptor positive status [ER+]: Secondary | ICD-10-CM | POA: Diagnosis not present

## 2022-01-27 DIAGNOSIS — R293 Abnormal posture: Secondary | ICD-10-CM | POA: Diagnosis not present

## 2022-01-27 NOTE — Therapy (Signed)
OUTPATIENT PHYSICAL THERAPY TREATMENT NOTE   Patient Name: Kayla Bennett MRN: [id number] DOB:Jul 15, 1951, 71 y.o., female Today's Date: 01/27/2022  PCP: [name], MD REFERRING PROVIDER: [name], MD   PT End of Session - 01/27/22 1257     Visit Number 18    Number of Visits 26    Date for PT Re-Evaluation 02/24/22    Authorization Type 100% coverage    PT Start Time 1301    PT Stop Time 1355    PT Time Calculation (min) 54 min    Activity Tolerance Patient tolerated treatment well    Behavior During Therapy WFL for tasks assessed/performed              Past Medical History:  Diagnosis Date   Anemia    Arthritis    Asthma    Breast cancer ([place])    Diabetes ([place])    Dyspnea    Dysrhythmia    afibb on occasion   Family history of adverse reaction to anesthesia    mother had reaction to versed...during surgery, stopped breathing   Fibromyalgia    GERD (gastroesophageal reflux disease)    Headache    History of hiatal hernia    Hypertension    Migraines    Osteopenia    Psoriasis    Yeast infection 10/30/2021   Past Surgical History:  Procedure Laterality Date   ABDOMINAL HYSTERECTOMY     APPENDECTOMY     ATRIAL FIBRILLATION ABLATION N/A 12/10/2021   Procedure: ATRIAL FIBRILLATION ABLATION;  Surgeon: Constance Haw, MD;  Location: [facility];  Service: Cardiovascular;  Laterality: N/A;   BREAST LUMPECTOMY WITH RADIOACTIVE SEED AND SENTINEL LYMPH NODE BIOPSY Right 02/18/2021   Procedure: RIGHT BREAST LUMPECTOMY WITH RADIOACTIVE SEED AND SENTINEL LYMPH NODE BIOPSY;  Surgeon: [name], MD;  Location: [facility];  Service: General;  Laterality: Right;   broken wrist     CARDIOVERSION N/A 04/04/2021   Procedure: CARDIOVERSION;  Surgeon: Sueanne Margarita, MD;  Location: [facility];  Service: Cardiovascular;  Laterality: N/A;   CARDIOVERSION N/A 08/14/2021   Procedure: CARDIOVERSION;  Surgeon: Geralynn Rile, MD;  Location: [facility];  Service: Cardiovascular;  Laterality: N/A;   CARDIOVERSION N/A 09/19/2021   Procedure: CARDIOVERSION;  Surgeon: Geralynn Rile, MD;  Location: [facility];  Service: Cardiovascular;  Laterality: N/A;   [place]     right wrist   HYSTERECTOMY ABDOMINAL WITH SALPINGO-OOPHORECTOMY     LYSIS OF ADHESION     SHOULDER SURGERY Left    TUBAL LIGATION     Patient Active Problem List   Diagnosis Date Noted   Yeast infection 10/30/2021   Breast abscess    Sepsis ([place]) 10/12/2021   Fever 10/12/2021   Nausea 10/12/2021   Headache 10/12/2021   Generalized weakness 10/12/2021   Diabetes ([place])    Hypertension    GERD (gastroesophageal reflux disease)    Iron deficiency anemia 09/25/2021   Secondary hypercoagulable state ([place]) 07/26/2021   Persistent atrial fibrillation ([place])    Genetic testing 12/26/2020   Malignant neoplasm of upper-outer quadrant of right breast in female, estrogen receptor positive ([place]) 11/28/2020    REFERRING DIAG: Rt breast cancer, muscle weakness   THERAPY DIAG:  Muscle weakness (generalized)  Difficulty in walking, not elsewhere classified  Stiffness of right shoulder, not elsewhere classified  Abnormal posture  Malignant neoplasm of upper-outer quadrant of  right breast in female, estrogen receptor positive ([place])  Localized edema  Aftercare following surgery for neoplasm  PERTINENT HISTORY: Patient was diagnosed on 11/22/2020 with right invasive ductal carcinoma breast cancer. She underwent a right lumpectomy and sentinel node biopsy (5 negative nodes) on 02/18/2021. It is ER/PR positive and HER2 negative with a Ki67 of 10%. She has many orthopedic problems from a traumatic fall and other accidents, pt has had 3 cardioversions this year for atrial fib on 04/04/21, 08/14/21 and 09/19/21, has hx of rotator cuff surgery on the R   PRECAUTIONS: A-fib, recent cardioablation, lymphedema Risk  Rt UE  SUBJECTIVE: I am doing well.    PAIN:  Are you having pain? No  TODAY'S TREATMENT  01/27/22 Therapeutic Exercise: Nustep   LE only level 4 x 10 min with no SOB reported; seat at 7; Steps: 483  Redid BERG test and MMT and reviewed and updated HEP per below  Supine: bridging x 10 reps with v/c to keep core engaged, SLR x AAROM today 5 reps bilaterally Hooklying abduction red band x 10 each Hooklying ball squeeze x 10  Neuromuscular Reeducation In parallel bars:  Standing on foam x 60seconds and feet together on foam x 30" CGA  01/23/22 Therapeutic Exercise: Nustep    LE only level 4 x 10 min with no SOB reported; seat at 7; Steps: [place]    2# 2x 10 bilat- pt reports increased fatigue today Marching  2# 2x 10 - pt reports difficulty with this Ball squeeze  X10 Sit to stands   x 2 sets of 10 reps  Supine: bridging x 10 reps with v/c to keep core engaged, SLR x 10 reps bilaterally with increased fatigue at 8 reps Sidelying: hip abduction x 10 reps bilaterally with v/c to keep hip straight  Neuromuscular Reeducation In parallel bars:  Heel/toe walking on air ex beam x 5 with hand held assist on bars for balance Standing in tandem stance x 2 min with increased difficulty Standing in tandem with eyes closed x 10 min with increased sway Standing in near tandem with eyes closed with increased sway but easier than in tandem  SLS x 5 reps each   TREATMENT 3/623:   Therapeutic Exercise: Nustep    LE only level 3 x 8 min with no SOB reported; seat at 6;  LAQ    2# 2x 10 bilat- pt reports increased fatigue today Marching  2# 2x 10 - pt reports difficulty with this Ball squeeze  X10  Supine: bridging x 10 reps with v/c to keep core engaged, SLR x 10 reps bilaterally, SAQ 2# x 10 reps bilaterally Sidelying: hip abduction x 10 reps bilaterally  Neuromuscular Reeducation In parallel bars:  Braiding x 4 times in // bars with some difficulty with placing foot behind Heel/toe x  4 times Walking on heels x 4 with HHA Walking on toex x 4 with HHA  TREATMENT 01/16/22:   Therapeutic Exercise: Nustep    LE only level 3 x 8 min with no SOB reported; seat at 6 LAQ    2# 4x 5 bilat- pt reports increased fatigue today Marching  2# 4x 5 - pt reports difficulty with this Ball squeeze  X10  Supine: bridging x 10 reps with v/c to keep core engaged, SLR x 10 reps bilaterally Sidelying: hip abduction x 10 reps bilaterally  Neuromuscular Reeducation In parallel bars:  Braiding x 4 times in // bars with pt demonstrating improved balance today Heel/toe x  4 times Standing with 1 foot on air ex and doing 3 way hip with opposite foot x 10 x in flexion, extension and abduction then did the opposite side with pt only requiring min cueing for keeping knee straight with extension      Clinical Impression Statement  Reassessed goals today which are not yet met.  No change overall on berg balance score still at 47/56.  LE demonstrating increased knee strength but not hip strength.  Extending POC to continue work on gait and balance stability    PT Frequency; 2x / week x 4 weeks   PT Treatment/Interventions  ADLs/Self Care Home Management;Therapeutic exercise;Patient/family education;Therapeutic activities;Neuromuscular re-education;Balance training;Gait training;Stair training;Manual techniques;Manual lymph drainage;Passive range of motion    PT Next Visit Plan:  Do SOZO if pt has not been in Afib x 35months ~04/07/22  cont LE strengthening and high level balance exercises    PT Home Exercise Plan  Access Code: [email or web address]  Access Code: [email or web address]: 03/13/2023Prepared by: Marcene Brawn TevisExercises  Standing Hip Extension with Anchored Resistance - 1 x daily - 7 x weekly - 1 sets - 10 reps  Standing Hip Abduction with Anchored Resistance - 1 x daily - 7 x weekly - 1 sets - 10 reps  Standing Hip Flexion with Anchored Resistance and Chair Support - 1 x  daily - 7 x weekly - 1 sets - 10 reps  Standing Hip Adduction with Resistance - 1 x daily - 7 x weekly - 1 sets - 10 reps  Active Straight Leg Raise with Quad Set - 1 x daily - 7 x weekly - 1-3 sets - 10 reps - 20-30 seconds hold  Supine Bridge - 1 x daily - 7 x weekly - 1-3 sets - 10 reps - 20-30 seconds hold    PT Long Term Goals - 01/02/22 0806       PT LONG TERM GOAL #1   Title Pt will demonstrate 4/5 bilateral hip flexion strength to decrease fall risk    Baseline     Status ongoing     PT LONG TERM GOAL #2   Title Pt will demonstrate 4+/5 R knee flexion strength to decrease risk of falls    Baseline 3/5; 11/26/21- 3/5; 12/31/21- 4+/5    Status Achieved      PT LONG TERM GOAL #3   Title Pt will demonstrate 4+/5 bilateral  knee extensor strength to decrease risk of falls.    Baseline 3/5 R, 4/5 L; 12/31/21- R 4+/5    Status Achieved      PT LONG TERM GOAL #4   Title Pt will report she feels safe returning to work because she is not afraid of falling    Baseline 11/26/21- pt is still fearful of falling at work; 12/31/21- pt reports she still has some hesitancy due to fear of falling    Status Achieved     PT LONG TERM GOAL #5   Title Pt will be indepdent in a home exercise program for continued stretching and strengthening    Status On-going             Shan Levans, PT

## 2022-01-28 ENCOUNTER — Encounter: Payer: Medicare Other | Admitting: Physical Therapy

## 2022-01-29 ENCOUNTER — Other Ambulatory Visit: Payer: Self-pay

## 2022-01-29 ENCOUNTER — Ambulatory Visit (HOSPITAL_BASED_OUTPATIENT_CLINIC_OR_DEPARTMENT_OTHER): Payer: Medicare Other | Attending: Cardiology | Admitting: Cardiology

## 2022-01-29 VITALS — Ht 62.0 in | Wt 193.0 lb

## 2022-01-29 DIAGNOSIS — G4731 Primary central sleep apnea: Secondary | ICD-10-CM | POA: Diagnosis not present

## 2022-01-29 DIAGNOSIS — G4733 Obstructive sleep apnea (adult) (pediatric): Secondary | ICD-10-CM | POA: Diagnosis not present

## 2022-01-30 ENCOUNTER — Encounter: Payer: Self-pay | Admitting: Physical Therapy

## 2022-01-30 ENCOUNTER — Ambulatory Visit: Payer: Medicare Other | Admitting: Physical Therapy

## 2022-01-30 DIAGNOSIS — R293 Abnormal posture: Secondary | ICD-10-CM | POA: Diagnosis not present

## 2022-01-30 DIAGNOSIS — Z170 Estrogen receptor positive status [ER+]: Secondary | ICD-10-CM | POA: Diagnosis not present

## 2022-01-30 DIAGNOSIS — R262 Difficulty in walking, not elsewhere classified: Secondary | ICD-10-CM | POA: Diagnosis not present

## 2022-01-30 DIAGNOSIS — M6281 Muscle weakness (generalized): Secondary | ICD-10-CM | POA: Diagnosis not present

## 2022-01-30 DIAGNOSIS — C50411 Malignant neoplasm of upper-outer quadrant of right female breast: Secondary | ICD-10-CM | POA: Diagnosis not present

## 2022-01-30 DIAGNOSIS — M25611 Stiffness of right shoulder, not elsewhere classified: Secondary | ICD-10-CM | POA: Diagnosis not present

## 2022-01-30 NOTE — Therapy (Signed)
?OUTPATIENT PHYSICAL THERAPY TREATMENT NOTE ? ? ?Patient Name: Kayla Bennett ?MRN: [id number] ?DOB:04-08-1951, 71 y.o., female ?Today's Date: 01/30/2022 ? ?PCP: [name], MD ?REFERRING PROVIDER: [name], MD ? ? PT End of Session - 01/30/22 1300   ? ? Visit Number 19   add kx  ? Number of Visits 26   ? Date for PT Re-Evaluation 02/24/22   ? Authorization Type 100% coverage   ? PT Start Time 1258   ? PT Stop Time 1348   ? PT Time Calculation (min) 50 min   ? Activity Tolerance Patient tolerated treatment well   ? Behavior During Therapy [facility] for tasks assessed/performed   ? ?  ?  ? ?  ? ? ? ?Past Medical History:  ?Diagnosis Date  ? Anemia   ? Arthritis   ? Asthma   ? Breast cancer ([place])   ? Diabetes ([place])   ? Dyspnea   ? Dysrhythmia   ? afibb on occasion  ? Family history of adverse reaction to anesthesia   ? mother had reaction to versed...during surgery, stopped breathing  ? Fibromyalgia   ? GERD (gastroesophageal reflux disease)   ? Headache   ? History of hiatal hernia   ? Hypertension   ? Migraines   ? Osteopenia   ? Psoriasis   ? Yeast infection 10/30/2021  ? ?Past Surgical History:  ?Procedure Laterality Date  ? ABDOMINAL HYSTERECTOMY    ? APPENDECTOMY    ? ATRIAL FIBRILLATION ABLATION N/A 12/10/2021  ? Procedure: ATRIAL FIBRILLATION ABLATION;  Surgeon: Constance Haw, MD;  Location: [facility];  Service: Cardiovascular;  Laterality: N/A;  ? BREAST LUMPECTOMY WITH RADIOACTIVE SEED AND SENTINEL LYMPH NODE BIOPSY Right 02/18/2021  ? Procedure: RIGHT BREAST LUMPECTOMY WITH RADIOACTIVE SEED AND SENTINEL LYMPH NODE BIOPSY;  Surgeon: [name], MD;  Location: [facility];  Service: General;  Laterality: Right;  ? broken wrist    ? CARDIOVERSION N/A 04/04/2021  ? Procedure: CARDIOVERSION;  Surgeon: Sueanne Margarita, MD;  Location: [facility];  Service: Cardiovascular;  Laterality: N/A;  ? CARDIOVERSION N/A 08/14/2021  ? Procedure: CARDIOVERSION;  Surgeon: Geralynn Rile, MD;  Location:  [facility];  Service: Cardiovascular;  Laterality: N/A;  ? CARDIOVERSION N/A 09/19/2021  ? Procedure: CARDIOVERSION;  Surgeon: Geralynn Rile, MD;  Location: [facility];  Service: Cardiovascular;  Laterality: N/A;  ? [place]  ? CHOLECYSTECTOMY    ? FRACTURE SURGERY    ? right wrist  ? HYSTERECTOMY ABDOMINAL WITH SALPINGO-OOPHORECTOMY    ? LYSIS OF ADHESION    ? SHOULDER SURGERY Left   ? TUBAL LIGATION    ? ?Patient Active Problem List  ? Diagnosis Date Noted  ? Yeast infection 10/30/2021  ? Breast abscess   ? Sepsis ([place]) 10/12/2021  ? Fever 10/12/2021  ? Nausea 10/12/2021  ? Headache 10/12/2021  ? Generalized weakness 10/12/2021  ? Diabetes ([place])   ? Hypertension   ? GERD (gastroesophageal reflux disease)   ? Iron deficiency anemia 09/25/2021  ? Secondary hypercoagulable state ([place]) 07/26/2021  ? Persistent atrial fibrillation ([place])   ? Genetic testing 12/26/2020  ? Malignant neoplasm of upper-outer quadrant of right breast in female, estrogen receptor positive ([place]) 11/28/2020  ? ? ?REFERRING DIAG: Rt breast cancer, muscle weakness  ? ?THERAPY DIAG:  ?Muscle weakness (generalized) ? ?Difficulty in walking, not elsewhere classified ? ?Stiffness of right shoulder, not elsewhere classified ? ?Abnormal posture ? ?Malignant neoplasm of  upper-outer quadrant of right breast in female, estrogen receptor positive ([place]) ? ?PERTINENT HISTORY: Patient was diagnosed on 11/22/2020 with right invasive ductal carcinoma breast cancer. She underwent a right lumpectomy and sentinel node biopsy (5 negative nodes) on 02/18/2021. It is ER/PR positive and HER2 negative with a Ki67 of 10%. She has many orthopedic problems from a traumatic fall and other accidents, pt has had 3 cardioversions this year for atrial fib on 04/04/21, 08/14/21 and 09/19/21, has hx of rotator cuff surgery on the R  ? ?PRECAUTIONS: A-fib, recent cardioablation, lymphedema Risk Rt UE ? ?SUBJECTIVE: I am really tired today. I had my  sleep apnea study last night.  ? ?PAIN:  ?Are you having pain? No ? ?TODAY'S TREATMENT  ? ?01/30/22 ?Therapeutic Exercise: ?Nustep LE only level 4 x 10 min with no SOB reported; seat at 7; Steps: 391 ? ?Supine: bridging x 10 reps with v/c to keep core engaged ?SLR x10 reps bilaterally ?Hooklying abduction red band x 10 each ?Hooklying ball squeeze x 10 ?Heel raises in // bars x 10 ?Mini squats in // bars x 10 with max v/c for correct form ?Attempted wall taps but pt had increased difficulty with this ? ?Seated Exercise: ?Hip flexion x 10 bilat with no added resistance due to fatigue ?Knee extension x 10 bilat with no added resistance due to fatigue ? ?Neuromuscular Reeducation ?In parallel bars:  ?Standing on foam: hip flex x 10 bilat, hip ext x 10 bilat, hip abd x 10 bilat with pt holding on to bars for support ?Braiding x 4 with increased difficulty coordinating this ?Heel/toe x 4 with assist from // bars ? ?01/27/22 ?Therapeutic Exercise: ?Nustep   LE only level 4 x 10 min with no SOB reported; seat at 7; Steps: 483 ? ?Redid BERG test and MMT and reviewed and updated HEP per below ? ?Supine: bridging x 10 reps with v/c to keep core engaged, SLR x AAROM today 5 reps bilaterally ?Hooklying abduction red band x 10 each ?Hooklying ball squeeze x 10 ? ?Neuromuscular Reeducation ?In parallel bars:  ?Standing on foam x 60seconds and feet together on foam x 30" CGA ? ?01/23/22 ?Therapeutic Exercise: ?Nustep    LE only level 4 x 10 min with no SOB reported; seat at 7; Steps: 543 ?LAQ    2# 2x 10 bilat- pt reports increased fatigue today ?Marching  2# 2x 10 - pt reports difficulty with this ?Ball squeeze  X10 ?Sit to stands   x 2 sets of 10 reps ? ?Supine: bridging x 10 reps with v/c to keep core engaged, SLR x 10 reps bilaterally with increased fatigue at 8 reps ?Sidelying: hip abduction x 10 reps bilaterally with v/c to keep hip straight ? ?Neuromuscular Reeducation ?In parallel bars:  ?Heel/toe walking on air ex beam x 5  with hand held assist on bars for balance ?Standing in tandem stance x 2 min with increased difficulty ?Standing in tandem with eyes closed x 10 min with increased sway ?Standing in near tandem with eyes closed with increased sway but easier than in tandem  ?SLS x 5 reps each  ? ?TREATMENT 3/623:  ? ?Therapeutic Exercise: ?Nustep    LE only level 3 x 8 min with no SOB reported; seat at 6;  ?LAQ    2# 2x 10 bilat- pt reports increased fatigue today ?Marching  2# 2x 10 - pt reports difficulty with this ?Ball squeeze  X10 ? ?Supine: bridging x 10 reps with v/c to keep  core engaged, SLR x 10 reps bilaterally, SAQ 2# x 10 reps bilaterally ?Sidelying: hip abduction x 10 reps bilaterally ? ?Neuromuscular Reeducation ?In parallel bars:  ?Braiding x 4 times in // bars with some difficulty with placing foot behind ?Heel/toe x 4 times ?Walking on heels x 4 with HHA ?Walking on toex x 4 with HHA ? ?Clinical Impression Statement ? ?Pt feeling more fatigued today with increased difficulty with coordination. Pt able to participate today despite fatigue but did require a few recovery periods. Pt had increased difficulty with coordinating braiding exercise and for mini squats. She was able to demonstrate straight leg raises today.  ? ?PT Frequency; 2x / week x 4 weeks ?  ?PT Treatment/Interventions  ADLs/Self Care Home Management;Therapeutic exercise;Patient/family education;Therapeutic activities;Neuromuscular re-education;Balance training;Gait training;Stair training;Manual techniques;Manual lymph drainage;Passive range of motion  ?  ?PT Next Visit Plan:  Do SOZO if pt has not been in Afib x 11month ~04/07/22  cont LE strengthening and high level balance exercises  ?  ?PT Home Exercise Plan  Access Code: [email or web address] ?Access Code: [email or web address]: 03/13/2023Prepared by: KMarcene BrawnTevisExercises  ?Standing Hip Extension with Anchored Resistance - 1 x daily - 7 x weekly - 1 sets - 10 reps  ?Standing Hip  Abduction with Anchored Resistance - 1 x daily - 7 x weekly - 1 sets - 10 reps  ?Standing Hip Flexion with Anchored Resistance and Chair Support - 1 x daily - 7 x weekly - 1 sets - 10 reps  ?Standing Hip Ad

## 2022-02-04 ENCOUNTER — Encounter: Payer: Self-pay | Admitting: Physical Therapy

## 2022-02-04 ENCOUNTER — Other Ambulatory Visit: Payer: Self-pay

## 2022-02-04 ENCOUNTER — Ambulatory Visit: Payer: Medicare Other | Admitting: Physical Therapy

## 2022-02-04 DIAGNOSIS — R262 Difficulty in walking, not elsewhere classified: Secondary | ICD-10-CM | POA: Diagnosis not present

## 2022-02-04 DIAGNOSIS — M6281 Muscle weakness (generalized): Secondary | ICD-10-CM | POA: Diagnosis not present

## 2022-02-04 DIAGNOSIS — C50411 Malignant neoplasm of upper-outer quadrant of right female breast: Secondary | ICD-10-CM | POA: Diagnosis not present

## 2022-02-04 DIAGNOSIS — R293 Abnormal posture: Secondary | ICD-10-CM

## 2022-02-04 DIAGNOSIS — Z170 Estrogen receptor positive status [ER+]: Secondary | ICD-10-CM | POA: Diagnosis not present

## 2022-02-04 DIAGNOSIS — M25611 Stiffness of right shoulder, not elsewhere classified: Secondary | ICD-10-CM

## 2022-02-04 NOTE — Procedures (Signed)
? ?  Patient Name: Kayla Bennett, Kayla Bennett ?Study Date: 01/29/2022 ?Gender: Female ?D.O.B: 1951/07/12 ?Age (years): 89 ?Referring Provider: Fransico Him MD, ABSM ?Height (inches): 62 ?Interpreting Physician: Fransico Him MD, ABSM ?Weight (lbs): 193 ?RPSGT: Laren Everts ?BMI: 35 ?MRN: [id number] ?Neck Size: 13.00 ? ?CLINICAL INFORMATION ?The patient is referred for a BiPAP titration to treat sleep apnea. ? ?SLEEP STUDY TECHNIQUE ?As per the AASM Manual for the Scoring of Sleep and Associated Events v2.3 (April 2016) with a hypopnea requiring 4% desaturations. ? ?The channels recorded and monitored were frontal, central and occipital EEG, electrooculogram (EOG), submentalis EMG (chin), nasal and oral airflow, thoracic and abdominal wall motion, anterior tibialis EMG, snore microphone, electrocardiogram, and pulse oximetry. Bilevel positive airway pressure (BPAP) was initiated at the beginning of the study and titrated to treat sleep-disordered breathing. ? ?MEDICATIONS ?Medications self-administered by patient taken the night of the study : Eliquis, Vitamin D3, VENLAFAXINE, PANTOPRAZOLE SODIUM, GABAPENTIN ? ?RESPIRATORY PARAMETERS ?Optimal IPAP Pressure (cm): 22  ?AHI at Optimal Pressure (/hr) 1.9 ?Optimal EPAP Pressure (cm):18  ?Overall Minimal O2 (%):81.0  ?Minimal O2 at Optimal Pressure (%): 90.0 ? ?SLEEP ARCHITECTURE ?Start Time:10:29:53 PM  ?Stop Time:4:44:33 AM  ?Total Time (min):374.7  ?Total Sleep Time (min):295.5 ?Sleep Latency (min):23.5  ?Sleep Efficiency (%):78.9%  ?REM Latency (min):344.5  ?WASO (min):55.7 ?Stage N1 (%): 21.8%  ?Stage N2 (%): 76.5% Stage N3 (%): 0.0%  ?Stage R (%):1.7 ?Supine (%):8.97  ?Arousal Index (/hr):33.1  ? ?CARDIAC DATA ?The 2 lead EKG demonstrated sinus rhythm. The mean heart rate was 77.6 beats per minute. Other EKG findings include: None. ? ?LEG MOVEMENT DATA ?The total Periodic Limb Movements of Sleep (PLMS) were 0. The PLMS index was 0.0. A PLMS index of <15 is considered normal  in adults. ? ?IMPRESSIONS ?- An optimal PAP pressure was selected for this patient ( 22 /18 cm of water) ?- Central sleep apnea was not noted during this titration (CAI = 1.6/h). ?- Moderete oxygen desaturations were observed during this titration (min O2 = 81.0%). ?- The patient snored with moderate snoring volume. ?- No cardiac abnormalities were observed during this study. ?- Clinically significant periodic limb movements were not noted during this study. Arousals associated with PLMs were rare. ? ?DIAGNOSIS ?- Obstructive Sleep Apnea (G47.33) ?- Nocturnal Hypoxemia ? ?RECOMMENDATIONS ?- Trial of auto BiPAP therapy with IPAP max 20cm H2O, EPAP min 5cm H2O and PS 4cm H2O with a Small-Medium size [employer] Full Face Evora Full mask and heated humidification. ?- Avoid alcohol, sedatives and other CNS depressants that may worsen sleep apnea and disrupt normal sleep architecture. ?- Sleep hygiene should be reviewed to assess factors that may improve sleep quality. ?- Weight management and regular exercise should be initiated or continued. ?- Return to Sleep Center for re-evaluation after 6 weeks of therapy ? ?[Electronically signed] 02/04/2022 10:53 AM ? ?Fransico Him MD, ABSM ?Diplomate, Tax adviser of Sleep Medicine ? ? ?

## 2022-02-04 NOTE — Therapy (Signed)
?OUTPATIENT PHYSICAL THERAPY TREATMENT NOTE ? ? ?Patient Name: Kayla Bennett ?MRN: [id number] ?DOB:1951/08/19, 71 y.o., female ?Today's Date: 02/04/2022 ? ?PCP: [name], MD ?REFERRING PROVIDER: [name], MD ? ? PT End of Session - 02/04/22 1403   ? ? Visit Number 20   add kx  ? Number of Visits 26   ? Date for PT Re-Evaluation 02/24/22   ? Authorization Type 100% coverage   ? PT Start Time 3354   ? PT Stop Time 5625   ? PT Time Calculation (min) 50 min   ? Activity Tolerance Patient tolerated treatment well   ? Behavior During Therapy [facility] for tasks assessed/performed   ? ?  ?  ? ?  ? ? ? ?Past Medical History:  ?Diagnosis Date  ? Anemia   ? Arthritis   ? Asthma   ? Breast cancer ([place])   ? Diabetes ([place])   ? Dyspnea   ? Dysrhythmia   ? afibb on occasion  ? Family history of adverse reaction to anesthesia   ? mother had reaction to versed...during surgery, stopped breathing  ? Fibromyalgia   ? GERD (gastroesophageal reflux disease)   ? Headache   ? History of hiatal hernia   ? Hypertension   ? Migraines   ? Osteopenia   ? Psoriasis   ? Yeast infection 10/30/2021  ? ?Past Surgical History:  ?Procedure Laterality Date  ? ABDOMINAL HYSTERECTOMY    ? APPENDECTOMY    ? ATRIAL FIBRILLATION ABLATION N/A 12/10/2021  ? Procedure: ATRIAL FIBRILLATION ABLATION;  Surgeon: Constance Haw, MD;  Location: [place] [facility];  Service: Cardiovascular;  Laterality: N/A;  ? BREAST LUMPECTOMY WITH RADIOACTIVE SEED AND SENTINEL LYMPH NODE BIOPSY Right 02/18/2021  ? Procedure: RIGHT BREAST LUMPECTOMY WITH RADIOACTIVE SEED AND SENTINEL LYMPH NODE BIOPSY;  Surgeon: [name], MD;  Location: [place];  Service: General;  Laterality: Right;  ? broken wrist    ? CARDIOVERSION N/A 04/04/2021  ? Procedure: CARDIOVERSION;  Surgeon: Sueanne Margarita, MD;  Location: [place];  Service: Cardiovascular;  Laterality: N/A;  ? CARDIOVERSION N/A 08/14/2021  ? Procedure: CARDIOVERSION;  Surgeon: Geralynn Rile, MD;  Location:  [place];  Service: Cardiovascular;  Laterality: N/A;  ? CARDIOVERSION N/A 09/19/2021  ? Procedure: CARDIOVERSION;  Surgeon: Geralynn Rile, MD;  Location: [place];  Service: Cardiovascular;  Laterality: N/A;  ? [place]  ? CHOLECYSTECTOMY    ? FRACTURE SURGERY    ? right wrist  ? HYSTERECTOMY ABDOMINAL WITH SALPINGO-OOPHORECTOMY    ? LYSIS OF ADHESION    ? SHOULDER SURGERY Left   ? TUBAL LIGATION    ? ?Patient Active Problem List  ? Diagnosis Date Noted  ? Yeast infection 10/30/2021  ? Breast abscess   ? Sepsis ([place]) 10/12/2021  ? Fever 10/12/2021  ? Nausea 10/12/2021  ? Headache 10/12/2021  ? Generalized weakness 10/12/2021  ? Diabetes ([place])   ? Hypertension   ? GERD (gastroesophageal reflux disease)   ? Iron deficiency anemia 09/25/2021  ? Secondary hypercoagulable state ([place]) 07/26/2021  ? Persistent atrial fibrillation ([place])   ? Genetic testing 12/26/2020  ? Malignant neoplasm of upper-outer quadrant of right breast in female, estrogen receptor positive ([place]) 11/28/2020  ? ? ?REFERRING DIAG: Rt breast cancer, muscle weakness  ? ?THERAPY DIAG:  ?Muscle weakness (generalized) ? ?Difficulty in walking, not elsewhere classified ? ?Stiffness of right shoulder, not elsewhere classified ? ?Abnormal posture ? ?Malignant neoplasm of  upper-outer quadrant of right breast in female, estrogen receptor positive ([place]) ? ?PERTINENT HISTORY: Patient was diagnosed on 11/22/2020 with right invasive ductal carcinoma breast cancer. She underwent a right lumpectomy and sentinel node biopsy (5 negative nodes) on 02/18/2021. It is ER/PR positive and HER2 negative with a Ki67 of 10%. She has many orthopedic problems from a traumatic fall and other accidents, pt has had 3 cardioversions this year for atrial fib on 04/04/21, 08/14/21 and 09/19/21, has hx of rotator cuff surgery on the R  ? ?PRECAUTIONS: A-fib, recent cardioablation, lymphedema Risk Rt UE ? ?SUBJECTIVE: Today is not a good day. I have been  staggering and a little stumbly. My thinking is a little cloudy. I can't explain why it is like this today.  ? ?PAIN:  ?Are you having pain? No ? ?TODAY'S TREATMENT  ? ?02/04/22 ?Therapeutic Exercise: ?Nustep LE only level 4 x 62min with increased fatigue noted so decreased resistance to 1 x 3 min and stopped due to faitgue; seat at 7; Steps: 391 ? ?Supine: bridging x 10 reps with v/c to keep core engaged ?SLR x10 reps bilaterally with v/c for keeping abdominals engaged ?Sidelying hip abduction x 10 reps bilaterally increased difficulty on L ?Hooklying ball squeeze x 10 ? ?Standing Exercise: ?Heel raises in // bars x 10 ?Mini squats in // bars x 10 with max v/c for correct form ?Attempted wall taps but pt had increased difficulty with this ? ?Seated Exercise: ?Hip flexion x 10 bilat with no added resistance due to fatigue ?Knee extension x 10 bilat with no added resistance due to fatigue ? ?Neuromuscular Reeducation ?In parallel bars:  ?Standing on foam: hip flex x 10 bilat, hip ext x 10 bilat, hip abd x 10 bilat with pt holding on to bars for support ?Heel/toe  walking x 4 with assist from // bars with 2 losses of balance that pt was able to self correct - therapy stopped a bit early due to fatigue ? ?01/30/22 ?Therapeutic Exercise: ?Nustep LE only level 4 x 10 min with no SOB reported; seat at 7; Steps: 391 ? ?Supine: bridging x 10 reps with v/c to keep core engaged ?SLR x10 reps bilaterally ?Hooklying abduction red band x 10 each ?Hooklying ball squeeze x 10 ?Heel raises in // bars x 10 ?Mini squats in // bars x 10 with max v/c for correct form ?Attempted wall taps but pt had increased difficulty with this ? ?Seated Exercise: ?Hip flexion x 10 bilat with no added resistance due to fatigue ?Knee extension x 10 bilat with no added resistance due to fatigue ? ?Neuromuscular Reeducation ?In parallel bars:  ?Standing on foam: hip flex x 10 bilat, hip ext x 10 bilat, hip abd x 10 bilat with pt holding on to bars for  support ?Braiding x 4 with increased difficulty coordinating this ?Heel/toe x 4 with assist from // bars ? ?01/27/22 ?Therapeutic Exercise: ?Nustep   LE only level 4 x 10 min with no SOB reported; seat at 7; Steps: 483 ? ?Redid BERG test and MMT and reviewed and updated HEP per below ? ?Supine: bridging x 10 reps with v/c to keep core engaged, SLR x AAROM today 5 reps bilaterally ?Hooklying abduction red band x 10 each ?Hooklying ball squeeze x 10 ? ?Neuromuscular Reeducation ?In parallel bars:  ?Standing on foam x 60seconds and feet together on foam x 30" CGA ? ?Clinical Impression Statement ? ?Pt still feeling more fatigued today. She was walking more off balance today. She  was not able to tolerate as much resistance today with the NuStep or exercises. Pt also reports she has gained about 9 lbs recently. At end of session walked pt to her car since she was unsteady. Pt reports she will reach out to the doctor if it continues.  ? ?PT Frequency; 2x / week x 4 weeks ?  ?PT Treatment/Interventions  ADLs/Self Care Home Management;Therapeutic exercise;Patient/family education;Therapeutic activities;Neuromuscular re-education;Balance training;Gait training;Stair training;Manual techniques;Manual lymph drainage;Passive range of motion  ?  ?PT Next Visit Plan:  make sure pt has follow up [facility] scheduled 3 months from 02/04/22,  cont LE strengthening and high level balance exercises  ?  ?PT Home Exercise Plan  Access Code: [email or web address]  ?Access Code: [email or web address]: [email or web address]: 03/13/2023Prepared by: Marcene Brawn TevisExercises  ?Standing Hip Extension with Anchored Resistance - 1 x daily - 7 x weekly - 1 sets - 10 reps  ?Standing Hip Abduction with Anchored Resistance - 1 x daily - 7 x weekly - 1 sets - 10 reps  ?Standing Hip Flexion with Anchored Resistance and Chair Support - 1 x daily - 7 x weekly - 1 sets - 10 reps  ?Standing Hip Adduction with Resistance - 1 x daily - 7 x weekly - 1 sets - 10 reps  ?Active  Straight Leg Raise with Quad Set - 1 x daily - 7 x weekly - 1-3 sets - 10 reps - 20-30 seconds hold  ?Supine Bridge - 1 x daily - 7 x weekly - 1-3 sets - 10 reps - 20-30 seconds hold ? ? ? Axis

## 2022-02-06 ENCOUNTER — Encounter: Payer: Self-pay | Admitting: Physical Therapy

## 2022-02-06 ENCOUNTER — Other Ambulatory Visit: Payer: Self-pay

## 2022-02-06 ENCOUNTER — Ambulatory Visit: Payer: Medicare Other | Admitting: Physical Therapy

## 2022-02-06 DIAGNOSIS — R262 Difficulty in walking, not elsewhere classified: Secondary | ICD-10-CM

## 2022-02-06 DIAGNOSIS — Z170 Estrogen receptor positive status [ER+]: Secondary | ICD-10-CM | POA: Diagnosis not present

## 2022-02-06 DIAGNOSIS — M25611 Stiffness of right shoulder, not elsewhere classified: Secondary | ICD-10-CM

## 2022-02-06 DIAGNOSIS — R293 Abnormal posture: Secondary | ICD-10-CM

## 2022-02-06 DIAGNOSIS — C50411 Malignant neoplasm of upper-outer quadrant of right female breast: Secondary | ICD-10-CM | POA: Diagnosis not present

## 2022-02-06 DIAGNOSIS — M6281 Muscle weakness (generalized): Secondary | ICD-10-CM

## 2022-02-06 NOTE — Therapy (Signed)
?OUTPATIENT PHYSICAL THERAPY TREATMENT NOTE ? ? ?Patient Name: AROHI Bennett ?MRN: [id number] ?DOB:[birth year], 71 y.o., female ?Today's Date: 02/06/2022 ? ?PCP: [name], MD ?REFERRING PROVIDER: [name], MD ? ? PT End of Session - 02/06/22 0901   ? ? Visit Number 21   add kx  ? Number of Visits 26   ? Date for PT Re-Evaluation 02/24/22   ? PT Start Time 0900   ? PT Stop Time 0954   ? PT Time Calculation (min) 54 min   ? Activity Tolerance Patient tolerated treatment well   ? Behavior During Therapy [facility] for tasks assessed/performed   ? ?  ?  ? ?  ? ? ? ?Past Medical History:  ?Diagnosis Date  ? Anemia   ? Arthritis   ? Asthma   ? Breast cancer ([place])   ? Diabetes ([place])   ? Dyspnea   ? Dysrhythmia   ? afibb on occasion  ? Family history of adverse reaction to anesthesia   ? mother had reaction to versed...during surgery, stopped breathing  ? Fibromyalgia   ? GERD (gastroesophageal reflux disease)   ? Headache   ? History of hiatal hernia   ? Hypertension   ? Migraines   ? Osteopenia   ? Psoriasis   ? Yeast infection 10/30/2021  ? ?Past Surgical History:  ?Procedure Laterality Date  ? ABDOMINAL HYSTERECTOMY    ? APPENDECTOMY    ? ATRIAL FIBRILLATION ABLATION N/A 12/10/2021  ? Procedure: ATRIAL FIBRILLATION ABLATION;  Surgeon: Constance Haw, MD;  Location: [place] [facility];  Service: Cardiovascular;  Laterality: N/A;  ? BREAST LUMPECTOMY WITH RADIOACTIVE SEED AND SENTINEL LYMPH NODE BIOPSY Right 02/18/2021  ? Procedure: RIGHT BREAST LUMPECTOMY WITH RADIOACTIVE SEED AND SENTINEL LYMPH NODE BIOPSY;  Surgeon: [name], MD;  Location: [place];  Service: General;  Laterality: Right;  ? broken wrist    ? CARDIOVERSION N/A 04/04/2021  ? Procedure: CARDIOVERSION;  Surgeon: Sueanne Margarita, MD;  Location: [place];  Service: Cardiovascular;  Laterality: N/A;  ? CARDIOVERSION N/A 08/14/2021  ? Procedure: CARDIOVERSION;  Surgeon: Geralynn Rile, MD;  Location: [place];  Service:  Cardiovascular;  Laterality: N/A;  ? CARDIOVERSION N/A 09/19/2021  ? Procedure: CARDIOVERSION;  Surgeon: Geralynn Rile, MD;  Location: [place];  Service: Cardiovascular;  Laterality: N/A;  ? [place]  ? CHOLECYSTECTOMY    ? FRACTURE SURGERY    ? right wrist  ? HYSTERECTOMY ABDOMINAL WITH SALPINGO-OOPHORECTOMY    ? LYSIS OF ADHESION    ? SHOULDER SURGERY Left   ? TUBAL LIGATION    ? ?Patient Active Problem List  ? Diagnosis Date Noted  ? Yeast infection 10/30/2021  ? Breast abscess   ? Sepsis ([place]) 10/12/2021  ? Fever 10/12/2021  ? Nausea 10/12/2021  ? Headache 10/12/2021  ? Generalized weakness 10/12/2021  ? Diabetes ([place])   ? Hypertension   ? GERD (gastroesophageal reflux disease)   ? Iron deficiency anemia 09/25/2021  ? Secondary hypercoagulable state ([place]) 07/26/2021  ? Persistent atrial fibrillation ([place])   ? Genetic testing 12/26/2020  ? Malignant neoplasm of upper-outer quadrant of right breast in female, estrogen receptor positive ([place]) 11/28/2020  ? ? ?REFERRING DIAG: Rt breast cancer, muscle weakness  ? ?THERAPY DIAG:  ?Muscle weakness (generalized) ? ?Difficulty in walking, not elsewhere classified ? ?Stiffness of right shoulder, not elsewhere classified ? ?Abnormal posture ? ?Malignant neoplasm of upper-outer quadrant of right breast in female,  estrogen receptor positive ([place]) ? ?PERTINENT HISTORY: Patient was diagnosed on 11/22/2020 with right invasive ductal carcinoma breast cancer. She underwent a right lumpectomy and sentinel node biopsy (5 negative nodes) on 02/18/2021. It is ER/PR positive and HER2 negative with a Ki67 of 10%. She has many orthopedic problems from a traumatic fall and other accidents, pt has had 3 cardioversions this year for atrial fib on 04/04/21, 08/14/21 and 09/19/21, has hx of rotator cuff surgery on the R  ? ?PRECAUTIONS: A-fib, recent cardioablation, lymphedema Risk Rt UE ? ?SUBJECTIVE: I am feeling a little better today. I slept better last night.   ? ?PAIN:  ?Are you having pain? No ? ?TODAY'S TREATMENT  ? ?02/06/22 ?Therapeutic Exercise: ?Nustep LE only level 5x x10 min, seat at 7; Steps: 465 ? ?Supine: bridging x 20 reps with v/c to keep core engaged; pt demonstrating improved ability to keep core engaged ?SLR x10 reps x 2 sets bilaterally with increased ease today ?Sidelying clam shells with red band x 20 each (STM performed to bilateral IT band to decrease tightness) increased difficulty on L ?Hooklying ball squeeze x 20 ? ?Neuromuscular Reeducation ?In parallel bars:  ?Standing on foam: hip flex x 10 bilat, hip ext x 10 bilat, hip abd x 10 bilat with pt holding on to bars for support ? ?02/04/22 ?Therapeutic Exercise: ?Nustep LE only level 4 x 15mn with increased fatigue noted so decreased resistance to 1 x 3 min and stopped due to faitgue; seat at 7; Steps: 391 ?  ?Supine: bridging x 10 reps with v/c to keep core engaged ?SLR x10 reps bilaterally with v/c for keeping abdominals engaged ?Sidelying hip abduction x 10 reps bilaterally increased difficulty on L ?Hooklying ball squeeze x 10 ?  ?Standing Exercise: ?Heel raises in // bars x 10 ?Mini squats in // bars x 10 with max v/c for correct form ?Attempted wall taps but pt had increased difficulty with this ?  ?Seated Exercise: ?Hip flexion x 10 bilat with no added resistance due to fatigue ?Knee extension x 10 bilat with no added resistance due to fatigue ?  ?Neuromuscular Reeducation ?In parallel bars:  ?Standing on foam: hip flex x 10 bilat, hip ext x 10 bilat, hip abd x 10 bilat with pt holding on to bars for support ?Heel/toe  walking x 4 with assist from // bars with 2 losses of balance that pt was able to self correct - therapy stopped a bit early due to fatigue ? ?01/30/22 ?Therapeutic Exercise: ?Nustep LE only level 4 x 10 min with no SOB reported; seat at 7; Steps: 391 ? ?Supine: bridging x 10 reps with v/c to keep core engaged ?SLR x10 reps bilaterally ?Hooklying abduction red band x 10  each ?Hooklying ball squeeze x 10 ?Heel raises in // bars x 10 ?Mini squats in // bars x 10 with max v/c for correct form ?Attempted wall taps but pt had increased difficulty with this ? ?Seated Exercise: ?Hip flexion x 10 bilat with no added resistance due to fatigue ?Knee extension x 10 bilat with no added resistance due to fatigue ? ?Neuromuscular Reeducation ?In parallel bars:  ?Standing on foam: hip flex x 10 bilat, hip ext x 10 bilat, hip abd x 10 bilat with pt holding on to bars for support ?Braiding x 4 with increased difficulty coordinating this ?Heel/toe x 4 with assist from // bars ? ?Clinical Impression Statement ? ?Pt feeling much better today. She was not nearly as fatigued. She was able  to tolerate twice the amount of reps on all supine exercises today and was able to increase the time and resistance on the NuStep compared to last session. Continued to work on improving strength and balance today to decrease fall risk.  ? ?PT Frequency; 2x / week x 4 weeks ?  ?PT Treatment/Interventions  ADLs/Self Care Home Management;Therapeutic exercise;Patient/family education;Therapeutic activities;Neuromuscular re-education;Balance training;Gait training;Stair training;Manual techniques;Manual lymph drainage;Passive range of motion  ?  ?PT Next Visit Plan:  make sure pt has follow up [facility] scheduled 3 months from 02/04/22,  cont LE strengthening and high level balance exercises  ?  ?PT Home Exercise Plan  Access Code: [email or web address]  ?Access Code: [email or web address]: [email or web address]: 03/13/2023Prepared by: Marcene Brawn TevisExercises  ?Standing Hip Extension with Anchored Resistance - 1 x daily - 7 x weekly - 1 sets - 10 reps  ?Standing Hip Abduction with Anchored Resistance - 1 x daily - 7 x weekly - 1 sets - 10 reps  ?Standing Hip Flexion with Anchored Resistance and Chair Support - 1 x daily - 7 x weekly - 1 sets - 10 reps  ?Standing Hip Adduction with Resistance - 1 x daily - 7 x weekly - 1 sets - 10 reps   ?Active Straight Leg Raise with Quad Set - 1 x daily - 7 x weekly - 1-3 sets - 10 reps - 20-30 seconds hold  ?Supine Bridge - 1 x daily - 7 x weekly - 1-3 sets - 10 reps - 20-30 seconds hold ? ? ? PT Long Term Goals - 02/

## 2022-02-08 ENCOUNTER — Telehealth: Payer: Self-pay | Admitting: *Deleted

## 2022-02-08 DIAGNOSIS — G4733 Obstructive sleep apnea (adult) (pediatric): Secondary | ICD-10-CM

## 2022-02-08 NOTE — Telephone Encounter (Signed)
-----   Message from Lauralee Evener, [place] sent at 02/05/2022 10:45 AM EDT ----- ? ?----- Message ----- ?From: Sueanne Margarita, MD ?Sent: 02/04/2022  10:55 AM EDT ?To: [facility] Sleep Studies ? ?Please let patient know that they had a successful PAP titration and let DME know that orders are in EPIC.  Please set up 6 week OV with me. Get an overnight pulse ox on BiPAP ?  ? ?

## 2022-02-11 ENCOUNTER — Other Ambulatory Visit: Payer: Self-pay

## 2022-02-11 ENCOUNTER — Encounter: Payer: Self-pay | Admitting: Physical Therapy

## 2022-02-11 ENCOUNTER — Ambulatory Visit: Payer: Medicare Other | Admitting: Physical Therapy

## 2022-02-11 DIAGNOSIS — R262 Difficulty in walking, not elsewhere classified: Secondary | ICD-10-CM | POA: Diagnosis not present

## 2022-02-11 DIAGNOSIS — R293 Abnormal posture: Secondary | ICD-10-CM

## 2022-02-11 DIAGNOSIS — C50411 Malignant neoplasm of upper-outer quadrant of right female breast: Secondary | ICD-10-CM

## 2022-02-11 DIAGNOSIS — M25611 Stiffness of right shoulder, not elsewhere classified: Secondary | ICD-10-CM | POA: Diagnosis not present

## 2022-02-11 DIAGNOSIS — M6281 Muscle weakness (generalized): Secondary | ICD-10-CM | POA: Diagnosis not present

## 2022-02-11 DIAGNOSIS — Z170 Estrogen receptor positive status [ER+]: Secondary | ICD-10-CM | POA: Diagnosis not present

## 2022-02-11 NOTE — Therapy (Signed)
?OUTPATIENT PHYSICAL THERAPY TREATMENT NOTE ? ? ?Patient Name: Kayla Bennett ?MRN: [id number] ?DOB:1950-11-29, 71 y.o., female ?Today's Date: 02/11/2022 ? ?PCP: [name], MD ?REFERRING PROVIDER: [name], MD ? ? PT End of Session - 02/11/22 1105   ? ? Visit Number 22   add kx modifier  ? Number of Visits 26   ? Date for PT Re-Evaluation 02/24/22   ? Authorization Type 100% coverage   ? PT Start Time 1104   ? PT Stop Time 1154   ? PT Time Calculation (min) 50 min   ? Activity Tolerance Patient tolerated treatment well   ? Behavior During Therapy [facility] for tasks assessed/performed   ? ?  ?  ? ?  ? ? ? ?Past Medical History:  ?Diagnosis Date  ? Anemia   ? Arthritis   ? Asthma   ? Breast cancer ([place])   ? Diabetes ([place])   ? Dyspnea   ? Dysrhythmia   ? afibb on occasion  ? Family history of adverse reaction to anesthesia   ? mother had reaction to versed...during surgery, stopped breathing  ? Fibromyalgia   ? GERD (gastroesophageal reflux disease)   ? Headache   ? History of hiatal hernia   ? Hypertension   ? Migraines   ? Osteopenia   ? Psoriasis   ? Yeast infection 10/30/2021  ? ?Past Surgical History:  ?Procedure Laterality Date  ? ABDOMINAL HYSTERECTOMY    ? APPENDECTOMY    ? ATRIAL FIBRILLATION ABLATION N/A 12/10/2021  ? Procedure: ATRIAL FIBRILLATION ABLATION;  Surgeon: Constance Haw, MD;  Location: [place] [facility];  Service: Cardiovascular;  Laterality: N/A;  ? BREAST LUMPECTOMY WITH RADIOACTIVE SEED AND SENTINEL LYMPH NODE BIOPSY Right 02/18/2021  ? Procedure: RIGHT BREAST LUMPECTOMY WITH RADIOACTIVE SEED AND SENTINEL LYMPH NODE BIOPSY;  Surgeon: [name], MD;  Location: [place];  Service: General;  Laterality: Right;  ? broken wrist    ? CARDIOVERSION N/A 04/04/2021  ? Procedure: CARDIOVERSION;  Surgeon: Sueanne Margarita, MD;  Location: [place];  Service: Cardiovascular;  Laterality: N/A;  ? CARDIOVERSION N/A 08/14/2021  ? Procedure: CARDIOVERSION;  Surgeon: Geralynn Rile, MD;   Location: [place];  Service: Cardiovascular;  Laterality: N/A;  ? CARDIOVERSION N/A 09/19/2021  ? Procedure: CARDIOVERSION;  Surgeon: Geralynn Rile, MD;  Location: [place];  Service: Cardiovascular;  Laterality: N/A;  ? [place]  ? CHOLECYSTECTOMY    ? FRACTURE SURGERY    ? right wrist  ? HYSTERECTOMY ABDOMINAL WITH SALPINGO-OOPHORECTOMY    ? LYSIS OF ADHESION    ? SHOULDER SURGERY Left   ? TUBAL LIGATION    ? ?Patient Active Problem List  ? Diagnosis Date Noted  ? Yeast infection 10/30/2021  ? Breast abscess   ? Sepsis ([place]) 10/12/2021  ? Fever 10/12/2021  ? Nausea 10/12/2021  ? Headache 10/12/2021  ? Generalized weakness 10/12/2021  ? Diabetes ([place])   ? Hypertension   ? GERD (gastroesophageal reflux disease)   ? Iron deficiency anemia 09/25/2021  ? Secondary hypercoagulable state ([place]) 07/26/2021  ? Persistent atrial fibrillation ([place])   ? Genetic testing 12/26/2020  ? Malignant neoplasm of upper-outer quadrant of right breast in female, estrogen receptor positive ([place]) 11/28/2020  ? ? ?REFERRING DIAG: Rt breast cancer, muscle weakness  ? ?THERAPY DIAG:  ?Muscle weakness (generalized) ? ?Difficulty in walking, not elsewhere classified ? ?Stiffness of right shoulder, not elsewhere classified ? ?Abnormal posture ? ?Malignant neoplasm  of upper-outer quadrant of right breast in female, estrogen receptor positive ([place]) ? ?PERTINENT HISTORY: Patient was diagnosed on 11/22/2020 with right invasive ductal carcinoma breast cancer. She underwent a right lumpectomy and sentinel node biopsy (5 negative nodes) on 02/18/2021. It is ER/PR positive and HER2 negative with a Ki67 of 10%. She has many orthopedic problems from a traumatic fall and other accidents, pt has had 3 cardioversions this year for atrial fib on 04/04/21, 08/14/21 and 09/19/21, has hx of rotator cuff surgery on the R  ? ?PRECAUTIONS: A-fib, recent cardioablation, lymphedema Risk Rt UE ? ?SUBJECTIVE: I am feeling a little better  today. I slept better last night.  ? ?PAIN:  ?Are you having pain? No ? ?TODAY'S TREATMENT  ? ?02/11/22 ?-Gait training:  ?Working on stair ambulation: up/down curb x 4 with pt having increased difficulty without UE support or hand rails ?-Up/down 5 steps with bilateral hand rails x 10 with pt going from step to gait to step through improving independence and form with pt reporting quad fatigue ?-Steps outside with 1 hand rail with pt feeling most safe going down sideways, educated pt to go down frontwards but be intentional with steps ?- used straight cane to have practice ambulate up/down 1 step and curb while instructing pt in correct technique for using the cane with pt demonstrating improved technique ?-practiced ambulating up/down ramp while using straight cane since pt tends to trip on ramp - pt did drag foot once - educated pt to lift leg up higher to clear ground ? ?Therapeutic Exercise: ?Nustep LE only level 5x x 8 min (unable to do 10 due to fatigue), seat at 7; Steps: 377 ? ?Neuromuscular Reeducation ?In parallel bars:  ?Heel/toe walking x 4 with pt only requiring occasional hand held assist ?Braiding x 4 with 3 losses of balance that pt was able to self correct using the rail ?Mini squats with mod verbal and visual cues for correct form x 10 ? ?02/06/22 ?Therapeutic Exercise: ?Nustep LE only level 5x x10 min, seat at 7; Steps: 465 ? ?Supine: bridging x 20 reps with v/c to keep core engaged; pt demonstrating improved ability to keep core engaged ?SLR x10 reps x 2 sets bilaterally with increased ease today ?Sidelying clam shells with red band x 20 each (STM performed to bilateral IT band to decrease tightness) increased difficulty on L ?Hooklying ball squeeze x 20 ? ?Neuromuscular Reeducation ?In parallel bars:  ?Standing on foam: hip flex x 10 bilat, hip ext x 10 bilat, hip abd x 10 bilat with pt holding on to bars for support ? ?02/04/22 ?Therapeutic Exercise: ?Nustep LE only level 4 x 65min with increased  fatigue noted so decreased resistance to 1 x 3 min and stopped due to faitgue; seat at 7; Steps: 391 ?  ?Supine: bridging x 10 reps with v/c to keep core engaged ?SLR x10 reps bilaterally with v/c for keeping abdominals engaged ?Sidelying hip abduction x 10 reps bilaterally increased difficulty on L ?Hooklying ball squeeze x 10 ?  ?Standing Exercise: ?Heel raises in // bars x 10 ?Mini squats in // bars x 10 with max v/c for correct form ?Attempted wall taps but pt had increased difficulty with this ?  ?Seated Exercise: ?Hip flexion x 10 bilat with no added resistance due to fatigue ?Knee extension x 10 bilat with no added resistance due to fatigue ?  ?Neuromuscular Reeducation ?In parallel bars:  ?Standing on foam: hip flex x 10 bilat, hip ext x 10 bilat,  hip abd x 10 bilat with pt holding on to bars for support ?Heel/toe  walking x 4 with assist from // bars with 2 losses of balance that pt was able to self correct - therapy stopped a bit early due to fatigue ? ?01/30/22 ?Therapeutic Exercise: ?Nustep LE only level 4 x 10 min with no SOB reported; seat at 7; Steps: 391 ? ?Supine: bridging x 10 reps with v/c to keep core engaged ?SLR x10 reps bilaterally ?Hooklying abduction red band x 10 each ?Hooklying ball squeeze x 10 ?Heel raises in // bars x 10 ?Mini squats in // bars x 10 with max v/c for correct form ?Attempted wall taps but pt had increased difficulty with this ? ?Seated Exercise: ?Hip flexion x 10 bilat with no added resistance due to fatigue ?Knee extension x 10 bilat with no added resistance due to fatigue ? ?Neuromuscular Reeducation ?In parallel bars:  ?Standing on foam: hip flex x 10 bilat, hip ext x 10 bilat, hip abd x 10 bilat with pt holding on to bars for support ?Braiding x 4 with increased difficulty coordinating this ?Heel/toe x 4 with assist from // bars ? ?Clinical Impression Statement ? ?Pt has been feeling unsteady with stairs, ramp and curbs. Worked today on instructing in proper gait  technique for steps and then had pt practice ambuation with straight cane to allow improved independence with stair and curb navigation. Pt requires further practice with this as she is still uncomfortabl

## 2022-02-13 ENCOUNTER — Ambulatory Visit: Payer: Medicare Other | Admitting: Physical Therapy

## 2022-02-13 ENCOUNTER — Encounter: Payer: Self-pay | Admitting: Physical Therapy

## 2022-02-13 DIAGNOSIS — M25611 Stiffness of right shoulder, not elsewhere classified: Secondary | ICD-10-CM | POA: Diagnosis not present

## 2022-02-13 DIAGNOSIS — Z170 Estrogen receptor positive status [ER+]: Secondary | ICD-10-CM | POA: Diagnosis not present

## 2022-02-13 DIAGNOSIS — C50411 Malignant neoplasm of upper-outer quadrant of right female breast: Secondary | ICD-10-CM

## 2022-02-13 DIAGNOSIS — M6281 Muscle weakness (generalized): Secondary | ICD-10-CM

## 2022-02-13 DIAGNOSIS — R293 Abnormal posture: Secondary | ICD-10-CM

## 2022-02-13 DIAGNOSIS — R262 Difficulty in walking, not elsewhere classified: Secondary | ICD-10-CM

## 2022-02-13 NOTE — Therapy (Signed)
?OUTPATIENT PHYSICAL THERAPY TREATMENT NOTE ? ? ?Patient Name: Kayla Bennett ?MRN: [id number] ?DOB:December 08, 1950, 71 y.o., female ?Today's Date: 02/13/2022 ? ?PCP: [name], MD ?REFERRING PROVIDER: [name], MD ? ? PT End of Session - 02/13/22 1302   ? ? Visit Number 23   add kx  ? Number of Visits 26   ? Date for PT Re-Evaluation 02/24/22   ? PT Start Time 1301   ? PT Stop Time 1355   ? PT Time Calculation (min) 54 min   ? Activity Tolerance Patient tolerated treatment well   ? Behavior During Therapy [facility] for tasks assessed/performed   ? ?  ?  ? ?  ? ? ? ?Past Medical History:  ?Diagnosis Date  ? Anemia   ? Arthritis   ? Asthma   ? Breast cancer ([place])   ? Diabetes ([place])   ? Dyspnea   ? Dysrhythmia   ? afibb on occasion  ? Family history of adverse reaction to anesthesia   ? mother had reaction to versed...during surgery, stopped breathing  ? Fibromyalgia   ? GERD (gastroesophageal reflux disease)   ? Headache   ? History of hiatal hernia   ? Hypertension   ? Migraines   ? Osteopenia   ? Psoriasis   ? Yeast infection 10/30/2021  ? ?Past Surgical History:  ?Procedure Laterality Date  ? ABDOMINAL HYSTERECTOMY    ? APPENDECTOMY    ? ATRIAL FIBRILLATION ABLATION N/A 12/10/2021  ? Procedure: ATRIAL FIBRILLATION ABLATION;  Surgeon: Constance Haw, MD;  Location: [place] [facility];  Service: Cardiovascular;  Laterality: N/A;  ? BREAST LUMPECTOMY WITH RADIOACTIVE SEED AND SENTINEL LYMPH NODE BIOPSY Right 02/18/2021  ? Procedure: RIGHT BREAST LUMPECTOMY WITH RADIOACTIVE SEED AND SENTINEL LYMPH NODE BIOPSY;  Surgeon: [name], MD;  Location: [place];  Service: General;  Laterality: Right;  ? broken wrist    ? CARDIOVERSION N/A 04/04/2021  ? Procedure: CARDIOVERSION;  Surgeon: Sueanne Margarita, MD;  Location: [place];  Service: Cardiovascular;  Laterality: N/A;  ? CARDIOVERSION N/A 08/14/2021  ? Procedure: CARDIOVERSION;  Surgeon: Geralynn Rile, MD;  Location: [place];  Service:  Cardiovascular;  Laterality: N/A;  ? CARDIOVERSION N/A 09/19/2021  ? Procedure: CARDIOVERSION;  Surgeon: Geralynn Rile, MD;  Location: [place];  Service: Cardiovascular;  Laterality: N/A;  ? [place]  ? CHOLECYSTECTOMY    ? FRACTURE SURGERY    ? right wrist  ? HYSTERECTOMY ABDOMINAL WITH SALPINGO-OOPHORECTOMY    ? LYSIS OF ADHESION    ? SHOULDER SURGERY Left   ? TUBAL LIGATION    ? ?Patient Active Problem List  ? Diagnosis Date Noted  ? Yeast infection 10/30/2021  ? Breast abscess   ? Sepsis ([place]) 10/12/2021  ? Fever 10/12/2021  ? Nausea 10/12/2021  ? Headache 10/12/2021  ? Generalized weakness 10/12/2021  ? Diabetes ([place])   ? Hypertension   ? GERD (gastroesophageal reflux disease)   ? Iron deficiency anemia 09/25/2021  ? Secondary hypercoagulable state ([place]) 07/26/2021  ? Persistent atrial fibrillation ([place])   ? Genetic testing 12/26/2020  ? Malignant neoplasm of upper-outer quadrant of right breast in female, estrogen receptor positive ([place]) 11/28/2020  ? ? ?REFERRING DIAG: Rt breast cancer, muscle weakness  ? ?THERAPY DIAG:  ?Muscle weakness (generalized) ? ?Difficulty in walking, not elsewhere classified ? ?Stiffness of right shoulder, not elsewhere classified ? ?Abnormal posture ? ?Malignant neoplasm of upper-outer quadrant of right breast in female,  estrogen receptor positive ([place]) ? ?PERTINENT HISTORY: Patient was diagnosed on 11/22/2020 with right invasive ductal carcinoma breast cancer. She underwent a right lumpectomy and sentinel node biopsy (5 negative nodes) on 02/18/2021. It is ER/PR positive and HER2 negative with a Ki67 of 10%. She has many orthopedic problems from a traumatic fall and other accidents, pt has had 3 cardioversions this year for atrial fib on 04/04/21, 08/14/21 and 09/19/21, has hx of rotator cuff surgery on the R  ? ?PRECAUTIONS: A-fib, recent cardioablation, lymphedema Risk Rt UE ? ?SUBJECTIVE: I am feeling a little better today. I slept better last night.   ? ?PAIN:  ?Are you having pain? No ? ?TODAY'S TREATMENT  ? ?02/13/22 ?-Gait training:  ?-Steps outside going up/down 1 step and educating pt on correct way to ascend/descend steps using straight cane and how to sequence steps with pt still requiring cueing for correct management of cane ?-practiced ambulating up/down ramp while using straight cane since pt tends to trip on ramp - pt lost balance a few times to the side and was able to self correct using the hand rail. She has difficulty on ramps and uneven surfaces ? ?Therapeutic Exercise: ?Nustep LE only level 5x 2 min then decreased to level 4 x 8 min, seat at 7; Steps: 573 ?Supine: SAQ with 2 lb weights x 5 sec holds x 10 reps each; bridging x 10 reps each with 3 sec holds ?S/L: clams x 10 reps bilaterally  with red theraband ?Neuromuscular Reeducation ?In parallel bars:  ?Standing on air ex with eyes open with increased sway noted and occasional use of hand rails for balance ?Standing on air ex with eyes closed with increased sway and reliance on handrails for assist ?Step ups on to air ex x 10 reps ? Marching on air ex x 10 reps ?02/11/22 ?-Gait training:  ?Working on stair ambulation: up/down curb x 4 with pt having increased difficulty without UE support or hand rails ?-Up/down 5 steps with bilateral hand rails x 10 with pt going from step to gait to step through improving independence and form with pt reporting quad fatigue ?-Steps outside with 1 hand rail with pt feeling most safe going down sideways, educated pt to go down frontwards but be intentional with steps ?- used straight cane to have practice ambulate up/down 1 step and curb while instructing pt in correct technique for using the cane with pt demonstrating improved technique ?-practiced ambulating up/down ramp while using straight cane since pt tends to trip on ramp - pt did drag foot once - educated pt to lift leg up higher to clear ground ? ?Therapeutic Exercise: ?Nustep LE only level 5x x 8 min  (unable to do 10 due to fatigue), seat at 7; Steps: 377 ? ?Neuromuscular Reeducation ?In parallel bars:  ?Heel/toe walking x 4 with pt only requiring occasional hand held assist ?Braiding x 4 with 3 losses of balance that pt was able to self correct using the rail ?Mini squats with mod verbal and visual cues for correct form x 10 ? ?02/06/22 ?Therapeutic Exercise: ?Nustep LE only level 5x x10 min, seat at 7; Steps: 465 ? ?Supine: bridging x 20 reps with v/c to keep core engaged; pt demonstrating improved ability to keep core engaged ?SLR x10 reps x 2 sets bilaterally with increased ease today ?Sidelying clam shells with red band x 20 each (STM performed to bilateral IT band to decrease tightness) increased difficulty on L ?Hooklying ball squeeze x 20 ? ?Neuromuscular  Reeducation ?In parallel bars:  ?Standing on foam: hip flex x 10 bilat, hip ext x 10 bilat, hip abd x 10 bilat with pt holding on to bars for support ? ?02/04/22 ?Therapeutic Exercise: ?Nustep LE only level 4 x 55mn with increased fatigue noted so decreased resistance to 1 x 3 min and stopped due to faitgue; seat at 7; Steps: 391 ?  ?Supine: bridging x 10 reps with v/c to keep core engaged ?SLR x10 reps bilaterally with v/c for keeping abdominals engaged ?Sidelying hip abduction x 10 reps bilaterally increased difficulty on L ?Hooklying ball squeeze x 10 ?  ?Standing Exercise: ?Heel raises in // bars x 10 ?Mini squats in // bars x 10 with max v/c for correct form ?Attempted wall taps but pt had increased difficulty with this ?  ?Seated Exercise: ?Hip flexion x 10 bilat with no added resistance due to fatigue ?Knee extension x 10 bilat with no added resistance due to fatigue ?  ?Neuromuscular Reeducation ?In parallel bars:  ?Standing on foam: hip flex x 10 bilat, hip ext x 10 bilat, hip abd x 10 bilat with pt holding on to bars for support ?Heel/toe  walking x 4 with assist from // bars with 2 losses of balance that pt was able to self correct - therapy  stopped a bit early due to fatigue ? ?01/30/22 ?Therapeutic Exercise: ?Nustep LE only level 4 x 10 min with no SOB reported; seat at 7; Steps: 391 ? ?Supine: bridging x 10 reps with v/c to keep core engaged ?SLR x1

## 2022-02-18 ENCOUNTER — Encounter: Payer: Self-pay | Admitting: Physical Therapy

## 2022-02-18 ENCOUNTER — Ambulatory Visit: Payer: Medicare Other | Attending: General Surgery | Admitting: Physical Therapy

## 2022-02-18 DIAGNOSIS — Z170 Estrogen receptor positive status [ER+]: Secondary | ICD-10-CM | POA: Insufficient documentation

## 2022-02-18 DIAGNOSIS — R293 Abnormal posture: Secondary | ICD-10-CM | POA: Insufficient documentation

## 2022-02-18 DIAGNOSIS — M25611 Stiffness of right shoulder, not elsewhere classified: Secondary | ICD-10-CM | POA: Insufficient documentation

## 2022-02-18 DIAGNOSIS — M6281 Muscle weakness (generalized): Secondary | ICD-10-CM | POA: Insufficient documentation

## 2022-02-18 DIAGNOSIS — R262 Difficulty in walking, not elsewhere classified: Secondary | ICD-10-CM | POA: Insufficient documentation

## 2022-02-18 DIAGNOSIS — C50411 Malignant neoplasm of upper-outer quadrant of right female breast: Secondary | ICD-10-CM | POA: Insufficient documentation

## 2022-02-18 NOTE — Addendum Note (Signed)
Addended by: Freada Bergeron on: 02/18/2022 06:19 PM ? ? Modules accepted: Orders ? ?

## 2022-02-18 NOTE — Therapy (Signed)
?OUTPATIENT PHYSICAL THERAPY TREATMENT NOTE ? ? ?Patient Name: Kayla Bennett ?MRN: [id number] ?DOB:01-28-1951, 71 y.o., female ?Today's Date: 02/18/2022 ? ?PCP: [name], MD ?REFERRING PROVIDER: [name], MD ? ? PT End of Session - 02/18/22 1307   ? ? Visit Number 24   add kx  ? Number of Visits 26   ? Date for PT Re-Evaluation 02/24/22   ? PT Start Time 4665   ? Activity Tolerance Patient tolerated treatment well   ? Behavior During Therapy [facility] for tasks assessed/performed   ? ?  ?  ? ?  ? ? ? ?Past Medical History:  ?Diagnosis Date  ? Anemia   ? Arthritis   ? Asthma   ? Breast cancer ([place])   ? Diabetes ([place])   ? Dyspnea   ? Dysrhythmia   ? afibb on occasion  ? Family history of adverse reaction to anesthesia   ? mother had reaction to versed...during surgery, stopped breathing  ? Fibromyalgia   ? GERD (gastroesophageal reflux disease)   ? Headache   ? History of hiatal hernia   ? Hypertension   ? Migraines   ? Osteopenia   ? Psoriasis   ? Yeast infection 10/30/2021  ? ?Past Surgical History:  ?Procedure Laterality Date  ? ABDOMINAL HYSTERECTOMY    ? APPENDECTOMY    ? ATRIAL FIBRILLATION ABLATION N/A 12/10/2021  ? Procedure: ATRIAL FIBRILLATION ABLATION;  Surgeon: Constance Haw, MD;  Location: [facility];  Service: Cardiovascular;  Laterality: N/A;  ? BREAST LUMPECTOMY WITH RADIOACTIVE SEED AND SENTINEL LYMPH NODE BIOPSY Right 02/18/2021  ? Procedure: RIGHT BREAST LUMPECTOMY WITH RADIOACTIVE SEED AND SENTINEL LYMPH NODE BIOPSY;  Surgeon: [name], MD;  Location: [facility];  Service: General;  Laterality: Right;  ? broken wrist    ? CARDIOVERSION N/A 04/04/2021  ? Procedure: CARDIOVERSION;  Surgeon: Sueanne Margarita, MD;  Location: [facility];  Service: Cardiovascular;  Laterality: N/A;  ? CARDIOVERSION N/A 08/14/2021  ? Procedure: CARDIOVERSION;  Surgeon: Geralynn Rile, MD;  Location: [facility];  Service: Cardiovascular;  Laterality: N/A;  ? CARDIOVERSION N/A 09/19/2021   ? Procedure: CARDIOVERSION;  Surgeon: Geralynn Rile, MD;  Location: [place];  Service: Cardiovascular;  Laterality: N/A;  ? [place]  ? CHOLECYSTECTOMY    ? FRACTURE SURGERY    ? right wrist  ? HYSTERECTOMY ABDOMINAL WITH SALPINGO-OOPHORECTOMY    ? LYSIS OF ADHESION    ? SHOULDER SURGERY Left   ? TUBAL LIGATION    ? ?Patient Active Problem List  ? Diagnosis Date Noted  ? Yeast infection 10/30/2021  ? Breast abscess   ? Sepsis ([place]) 10/12/2021  ? Fever 10/12/2021  ? Nausea 10/12/2021  ? Headache 10/12/2021  ? Generalized weakness 10/12/2021  ? Diabetes ([place])   ? Hypertension   ? GERD (gastroesophageal reflux disease)   ? Iron deficiency anemia 09/25/2021  ? Secondary hypercoagulable state ([place]) 07/26/2021  ? Persistent atrial fibrillation ([place])   ? Genetic testing 12/26/2020  ? Malignant neoplasm of upper-outer quadrant of right breast in female, estrogen receptor positive ([place]) 11/28/2020  ? ? ?REFERRING DIAG: Rt breast cancer, muscle weakness  ? ?THERAPY DIAG:  ?Muscle weakness (generalized) ? ?Difficulty in walking, not elsewhere classified ? ?Stiffness of right shoulder, not elsewhere classified ? ?Abnormal posture ? ?Malignant neoplasm of upper-outer quadrant of right breast in female, estrogen receptor positive ([place]) ? ?PERTINENT HISTORY: Patient was diagnosed on 11/22/2020 with right invasive  ductal carcinoma breast cancer. She underwent a right lumpectomy and sentinel node biopsy (5 negative nodes) on 02/18/2021. It is ER/PR positive and HER2 negative with a Ki67 of 10%. She has many orthopedic problems from a traumatic fall and other accidents, pt has had 3 cardioversions this year for atrial fib on 04/04/21, 08/14/21 and 09/19/21, has hx of rotator cuff surgery on the R  ? ?PRECAUTIONS: A-fib, recent cardioablation, lymphedema Risk Rt UE ? ?SUBJECTIVE: I am a little achy today. It feels like a high barametric pressure day.  ?PAIN:  ?Are you having pain? Yes 2/10, achy, whole  body ? ?TODAY'S TREATMENT  ? ?02/17/22 ?-Gait training:  ?-practiced ambulating up/down ramp with no assistive device since pt tends to trip on ramp - used CGA with gait belt today and pt did not have any losses of balance.  ?Therapeutic Exercise: ?Nustep LE only level 5x 10 min, seat at 7; Steps: 520 ?Supine: SAQ with 3 lb weights x 3 sec holds x 10 reps each; bridging x 10 reps each with 3 sec holds, SLR x 10 reps bilaterally easier on L ?S/L: clams x 10 reps bilaterally  with red theraband ?Neuromuscular Reeducation ?In parallel bars:  ?Standing on rocker board: forward and back x 10 reps with hand held assist on bars ?Step ups and hold on air ex x 10 bilaterally ?Standing in middle of colored circles with green cones having pt tap appropriate color without smashing cone - pt had increased difficulty stepping backwards and stepping with the L foot ?Weaving in and out of cones to change directions while maintaining balance, pt required CGA with gait belt ? ?02/13/22 ?-Gait training:  ?-Steps outside going up/down 1 step and educating pt on correct way to ascend/descend steps using straight cane and how to sequence steps with pt still requiring cueing for correct management of cane ?-practiced ambulating up/down ramp while using straight cane since pt tends to trip on ramp - pt lost balance a few times to the side and was able to self correct using the hand rail. She has difficulty on ramps and uneven surfaces ? ?Therapeutic Exercise: ?Nustep LE only level 5x 2 min then decreased to level 4 x 8 min, seat at 7; Steps: 573 ?Supine: SAQ with 2 lb weights x 5 sec holds x 10 reps each; bridging x 10 reps each with 3 sec holds ?S/L: clams x 10 reps bilaterally  with red theraband ?Neuromuscular Reeducation ?In parallel bars:  ?Standing on air ex with eyes open with increased sway noted and occasional use of hand rails for balance ?Standing on air ex with eyes closed with increased sway and reliance on handrails for  assist ?Step ups on to air ex x 10 reps ? Marching on air ex x 10 reps ?02/11/22 ?-Gait training:  ?Working on stair ambulation: up/down curb x 4 with pt having increased difficulty without UE support or hand rails ?-Up/down 5 steps with bilateral hand rails x 10 with pt going from step to gait to step through improving independence and form with pt reporting quad fatigue ?-Steps outside with 1 hand rail with pt feeling most safe going down sideways, educated pt to go down frontwards but be intentional with steps ?- used straight cane to have practice ambulate up/down 1 step and curb while instructing pt in correct technique for using the cane with pt demonstrating improved technique ?-practiced ambulating up/down ramp while using straight cane since pt tends to trip on ramp - pt did drag  foot once - educated pt to lift leg up higher to clear ground ? ?Therapeutic Exercise: ?Nustep LE only level 5x x 8 min (unable to do 10 due to fatigue), seat at 7; Steps: 377 ? ?Neuromuscular Reeducation ?In parallel bars:  ?Heel/toe walking x 4 with pt only requiring occasional hand held assist ?Braiding x 4 with 3 losses of balance that pt was able to self correct using the rail ?Mini squats with mod verbal and visual cues for correct form x 10 ? ?02/06/22 ?Therapeutic Exercise: ?Nustep LE only level 5x x10 min, seat at 7; Steps: 465 ? ?Supine: bridging x 20 reps with v/c to keep core engaged; pt demonstrating improved ability to keep core engaged ?SLR x10 reps x 2 sets bilaterally with increased ease today ?Sidelying clam shells with red band x 20 each (STM performed to bilateral IT band to decrease tightness) increased difficulty on L ?Hooklying ball squeeze x 20 ? ?Neuromuscular Reeducation ?In parallel bars:  ?Standing on foam: hip flex x 10 bilat, hip ext x 10 bilat, hip abd x 10 bilat with pt holding on to bars for support ? ?02/04/22 ?Therapeutic Exercise: ?Nustep LE only level 4 x 24mn with increased fatigue noted so  decreased resistance to 1 x 3 min and stopped due to faitgue; seat at 7; Steps: 391 ?  ?Supine: bridging x 10 reps with v/c to keep core engaged ?SLR x10 reps bilaterally with v/c for keeping abdominals engaged ?Sidel

## 2022-02-18 NOTE — Telephone Encounter (Addendum)
The patient has been notified of the result.Per DPR Left detailed message on voicemail with date and time of titration and informed patient to call back with questions. Kayla Bennett, [name] 02/18/2022 6:10 PM   ? ? DME selection is [facility]. ?Patient understands she will be contacted by [facility] to set up her cpap. ?Patient understands to call if [facility] does not contact her with new setup in a timely manner. ?Patient understands they will be called once confirmation has been received from [facility]/ that they have received their new machine to schedule 10 week follow up appointment. ? ?Get an overnight pulse ox on BiPAP  ?   ?[place] notified of new cpap order  ?Please add to airview. ?

## 2022-02-20 ENCOUNTER — Encounter: Payer: Medicare Other | Admitting: Physical Therapy

## 2022-02-21 ENCOUNTER — Inpatient Hospital Stay (HOSPITAL_BASED_OUTPATIENT_CLINIC_OR_DEPARTMENT_OTHER): Payer: Medicare Other | Admitting: Hematology

## 2022-02-21 ENCOUNTER — Inpatient Hospital Stay: Payer: Medicare Other | Attending: Radiation Oncology

## 2022-02-21 ENCOUNTER — Other Ambulatory Visit: Payer: Self-pay

## 2022-02-21 ENCOUNTER — Encounter: Payer: Self-pay | Admitting: Hematology

## 2022-02-21 VITALS — BP 142/70 | HR 78 | Temp 98.2°F | Resp 18 | Wt 200.4 lb

## 2022-02-21 DIAGNOSIS — Z85820 Personal history of malignant melanoma of skin: Secondary | ICD-10-CM | POA: Diagnosis not present

## 2022-02-21 DIAGNOSIS — Z7901 Long term (current) use of anticoagulants: Secondary | ICD-10-CM | POA: Insufficient documentation

## 2022-02-21 DIAGNOSIS — M25562 Pain in left knee: Secondary | ICD-10-CM | POA: Diagnosis not present

## 2022-02-21 DIAGNOSIS — I10 Essential (primary) hypertension: Secondary | ICD-10-CM | POA: Insufficient documentation

## 2022-02-21 DIAGNOSIS — M25511 Pain in right shoulder: Secondary | ICD-10-CM | POA: Insufficient documentation

## 2022-02-21 DIAGNOSIS — K219 Gastro-esophageal reflux disease without esophagitis: Secondary | ICD-10-CM | POA: Insufficient documentation

## 2022-02-21 DIAGNOSIS — Z7984 Long term (current) use of oral hypoglycemic drugs: Secondary | ICD-10-CM | POA: Diagnosis not present

## 2022-02-21 DIAGNOSIS — E538 Deficiency of other specified B group vitamins: Secondary | ICD-10-CM | POA: Diagnosis not present

## 2022-02-21 DIAGNOSIS — D649 Anemia, unspecified: Secondary | ICD-10-CM

## 2022-02-21 DIAGNOSIS — G8929 Other chronic pain: Secondary | ICD-10-CM | POA: Insufficient documentation

## 2022-02-21 DIAGNOSIS — M25512 Pain in left shoulder: Secondary | ICD-10-CM | POA: Diagnosis not present

## 2022-02-21 DIAGNOSIS — M797 Fibromyalgia: Secondary | ICD-10-CM | POA: Diagnosis not present

## 2022-02-21 DIAGNOSIS — E785 Hyperlipidemia, unspecified: Secondary | ICD-10-CM | POA: Diagnosis not present

## 2022-02-21 DIAGNOSIS — Z170 Estrogen receptor positive status [ER+]: Secondary | ICD-10-CM | POA: Diagnosis not present

## 2022-02-21 DIAGNOSIS — C50411 Malignant neoplasm of upper-outer quadrant of right female breast: Secondary | ICD-10-CM | POA: Diagnosis not present

## 2022-02-21 DIAGNOSIS — E1140 Type 2 diabetes mellitus with diabetic neuropathy, unspecified: Secondary | ICD-10-CM | POA: Insufficient documentation

## 2022-02-21 DIAGNOSIS — Z79899 Other long term (current) drug therapy: Secondary | ICD-10-CM | POA: Insufficient documentation

## 2022-02-21 DIAGNOSIS — D509 Iron deficiency anemia, unspecified: Secondary | ICD-10-CM | POA: Insufficient documentation

## 2022-02-21 DIAGNOSIS — M25559 Pain in unspecified hip: Secondary | ICD-10-CM | POA: Insufficient documentation

## 2022-02-21 LAB — CMP (CANCER CENTER ONLY)
ALT: 23 U/L (ref 0–44)
AST: 34 U/L (ref 15–41)
Albumin: 3.9 g/dL (ref 3.5–5.0)
Alkaline Phosphatase: 52 U/L (ref 38–126)
Anion gap: 9 (ref 5–15)
BUN: 12 mg/dL (ref 8–23)
CO2: 30 mmol/L (ref 22–32)
Calcium: 8.8 mg/dL — ABNORMAL LOW (ref 8.9–10.3)
Chloride: 101 mmol/L (ref 98–111)
Creatinine: 0.71 mg/dL (ref 0.44–1.00)
GFR, Estimated: >60 mL/min (ref >60)
Glucose, Bld: 98 mg/dL (ref 70–99)
Potassium: 3.4 mmol/L — ABNORMAL LOW (ref 3.5–5.1)
Sodium: 140 mmol/L (ref 135–145)
Total Bilirubin: 0.4 mg/dL (ref 0.3–1.2)
Total Protein: 6.8 g/dL (ref 6.5–8.1)

## 2022-02-21 LAB — IRON AND IRON BINDING CAPACITY (CC-WL,HP ONLY)
Iron: 78 ug/dL (ref 28–170)
Saturation Ratios: 17 % (ref 10.4–31.8)
TIBC: 473 ug/dL — ABNORMAL HIGH (ref 250–450)
UIBC: 395 ug/dL (ref 148–442)

## 2022-02-21 LAB — CBC WITH DIFFERENTIAL (CANCER CENTER ONLY)
Abs Immature Granulocytes: 0 10*3/uL (ref 0.00–0.07)
Basophils Absolute: 0 10*3/uL (ref 0.0–0.1)
Basophils Relative: 1 %
Eosinophils Absolute: 0.1 10*3/uL (ref 0.0–0.5)
Eosinophils Relative: 3 %
HCT: 33.9 % — ABNORMAL LOW (ref 36.0–46.0)
Hemoglobin: 11.2 g/dL — ABNORMAL LOW (ref 12.0–15.0)
Immature Granulocytes: 0 %
Lymphocytes Relative: 35 %
Lymphs Abs: 1.6 10*3/uL (ref 0.7–4.0)
MCH: 32 pg (ref 26.0–34.0)
MCHC: 33 g/dL (ref 30.0–36.0)
MCV: 96.9 fL (ref 80.0–100.0)
Monocytes Absolute: 0.6 10*3/uL (ref 0.1–1.0)
Monocytes Relative: 12 %
Neutro Abs: 2.3 10*3/uL (ref 1.7–7.7)
Neutrophils Relative %: 49 %
Platelet Count: 151 10*3/uL (ref 150–400)
RBC: 3.5 MIL/uL — ABNORMAL LOW (ref 3.87–5.11)
RDW: 12.6 % (ref 11.5–15.5)
WBC Count: 4.6 10*3/uL (ref 4.0–10.5)
nRBC: 0 % (ref 0.0–0.2)

## 2022-02-21 LAB — FERRITIN: Ferritin: 184 ng/mL (ref 11–307)

## 2022-02-21 LAB — VITAMIN B12: Vitamin B-12: 472 pg/mL (ref 180–914)

## 2022-02-21 NOTE — Progress Notes (Signed)
?[facility]   ?[phone] [phone]   ?Clinic Follow up Note  ? ?Patient Care Team: ?[name], MD as PCP - General (Family Medicine) ?Freada Bergeron, MD as PCP - Cardiology (Cardiology) ?Constance Haw, MD as PCP - Electrophysiology (Cardiology) ?Mauro Kaufmann, RN as Oncology Nurse Navigator ?Rockwell Germany, RN as Oncology Nurse Navigator ?[name], MD as Consulting Physician (General Surgery) ?Truitt Merle, MD as Consulting Physician (Hematology) ?Eppie Gibson, MD as Attending Physician (Radiation Oncology) ?Sharmon Revere as Physician Assistant (Cardiology) ?Alla Feeling, NP as Nurse Practitioner (Nurse Practitioner) ? ?Date of Service:  02/21/2022 ? ?CHIEF COMPLAINT: f/u of left breast cancer ? ?CURRENT THERAPY:  ?Tamoxifen p.o. daily started in July 2022 ? ?ASSESSMENT & PLAN:  ?Kayla Bennett is a 71 y.o. post-hysterectomy female with  ? ?1. Malignant neoplasm of upper-outer quadrant of right breast, Stage IA, (pT1c, pN0), ER+/PR+/HER2-, Grade II  ?-Discovered on screening mammogram. Biopsy 11/22/20 confirmed invasive ductal carcinoma. ?-Right lumpectomy on 02/18/21 under Dr. Barry Dienes showed grade 2 invasive ductal carcinoma, 1.2 cm, with DCIS. 5 benign lymph nodes. ?-Oncotype DX score of 0. ?-s/p adjuvant radiation under Dr. Isidore Moos 04/09/21 - 05/07/21. ?-she started tamoxifen in 05/2021. She is tolerating well except some hot flashes which is manageable.  ?-last mammogram at [facility] in 11/2021 per pt was benign. ?-she is clinically doing well. Labs reviewed, CBC shows improving anemia. Physical exam was unremarkable. There is no clinical concern for recurrence. ?-Labs and follow up with [place] in 6 months  ?  ?2. Comorbidities: Chronic joint pain, DM, HTN, fibromyalgia, GERD, HLD, osteopenia, Neuropathy ?-her joint pain is in b/l shoulders, left knee and hip with very limited ROM of left shoulder. ?-Continue medications and F/u with PCP Dr Dyane Dustman ?-She has had osteoporosis since her 45s. I discussed Tamoxifen can help strengthen her bones.  ?-Most recent DEXA scan 08/21/21.  The patient is up-to-date. ?-She is taking a vitamin D supplement but not calcium.  ?  ?3. Genetics ?-She has extensive paternal family history of colon cancer and melanoma. She is eligible for genetic testing.  ?-She was previously referred. ?  ?4. Anemia ?-Patient has a history of anemia due to B12 and iron deficiency. She is s/p hysterectomy. ?-per pt, she is due for repeat colonoscopy in 2025. ?-Received IV Feraheme in recent hospitalization on 10/14/21 ?-last iron panel, ferritin, and B12 on 12/27/21 were WNL. ?-hgb improving, 11.2 today (02/21/22). She endorses taking B12, oral iron, and prenatal vitamin. ?-continue monitoring  ? ? ?PLAN:  ?-continue tamoxifen ?-lab and f/u with NP Lacie in 6 months ? ? ?No problem-specific Assessment & Plan notes found for this encounter. ? ? ?SUMMARY OF ONCOLOGIC HISTORY: ?Oncology History Overview Note  ?Cancer Staging ?Malignant neoplasm of upper-outer quadrant of right breast in female, estrogen receptor positive ([place]) ?Staging form: Breast, AJCC 8th Edition ?- Clinical stage from 11/22/2020: Stage IA (cT1b, cN0, cM0, G2, ER+, PR+, HER2-) - Signed by Truitt Merle, MD on 12/04/2020 ?Stage prefix: Initial diagnosis ?- Pathologic: Stage IA (pT1c, pN0, cM0, G2, ER+, PR+, HER2-) - Signed by Gardenia Phlegm, NP on 02/27/2021 ?Histologic grading system: 3 grade system ? ?  ?Malignant neoplasm of upper-outer quadrant of right breast in female, estrogen receptor positive ([place])  ?11/05/2020 Breast US  ? US Breast 11/05/20  ?IMPRESSION ?No Sonographic correlate is seen for the round microlobulated 64m mass in the upper outer right breast 10cm from the nipple, 9:  00 position.  ?Given this mass is new on mammography and has suspicious borders, a stereotactic guided biopsy is recommended.  ? ?  ?11/22/2020 Cancer Staging  ? Staging form: Breast, AJCC 8th  Edition ?- Clinical stage from 11/22/2020: Stage IA (cT1b, cN0, cM0, G2, ER+, PR+, HER2-) - Signed by Truitt Merle, MD on 12/04/2020 ? ?  ?11/22/2020 Initial Biopsy  ?  ?Diagnosis 11/22/20  ?Breast, right, needle core biopsy, 10 cmfn, upper outer quadrant, post depth ?- INVASIVE DUCTAL CARCINOMA. SEE NOTE ?Diagnosis Note ?Carcinoma measures 0.7 cm in greatest linear dimension and appears grade 2. Dr. Tresa Moore reviewed the case and ?concurs with the diagnosis. A breast prognostic profile (ER, PR, Ki-67 and HER2) is pending and will be reported in an ?addendum. Dr. Luan Pulling was notified on 11/23/2020. ?  ?11/22/2020 Receptors her2  ?  ?ROGNOSTIC INDICATORS ?Results: ?IMMUNOHISTOCHEMICAL AND MORPHOMETRIC ANALYSIS PERFORMED MANUALLY ?The tumor cells are NEGATIVE for Her2 (1+). ?Estrogen Receptor: 95%, POSITIVE, STRONG STAINING INTENSITY ?Progesterone Receptor: 95%, POSITIVE, STRONG STAINING INTENSITY ?Proliferation Marker Ki67: 10% ?  ?11/28/2020 Initial Diagnosis  ? Malignant neoplasm of upper-outer quadrant of right breast in female, estrogen receptor positive ([place]) ?  ?12/22/2020 Genetic Testing  ? Negative hereditary cancer genetic testing: no pathogenic variants detected in [facility] Common Hereditary Cancers Panel + Melanoma Genes.  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  The report date is December 22, 2020.   ? ?The Common Hereditary Cancers Panel+Melanoma Genes offered by [facility] includes sequencing and/or deletion duplication testing of the following 52 genes: APC, ATM, AXIN2, BAP1, BARD1, BMPR1A, BRCA1, BRCA2, BRIP1, CDH1, CDK4, CDKN2A (p14ARF), CDKN2A (p16INK4a), CHEK2, CTNNA1, DICER1, EPCAM (Deletion/duplication testing only), GREM1 (promoter region deletion/duplication testing only), HOXB13, KIT, MEN1, MITF, MLH1, MSH2, MSH3, MSH6, MUTYH, NBN, NF1, NHTL1, PALB2, PDGFRA, PMS2, POLD1, POLE, PTEN, POT1, RAD50, RAD51C, RAD51D, RB1, RNF43, SDHA, SDHB, SDHC, SDHD, SMAD4, SMARCA4, STK11, TP53,  TSC1, TSC2, and VHL.  The following genes were evaluated for sequence changes only: SDHA and HOXB13 c.251G>A variant only. ?  ?02/18/2021 Surgery  ? FINAL MICROSCOPIC DIAGNOSIS:  ? ?A. BREAST, RIGHT, LUMPECTOMY:  ?- Invasive ductal carcinoma, 1.2 cm, grade 2  ?- Ductal carcinoma in situ, intermediate grade  ?- Resection margins are negative for carcinoma; posterior margin is less than 1 mm from carcinoma  ?- Biopsy site changes  ?- See oncology table  ? ?B. LYMPH NODE, RIGHT AXILLARY #1, SENTINEL, EXCISION:  ?- Lymph node, negative for carcinoma (0/1)  ? ?C. LYMPH NODE, RIGHT AXILLARY #2, SENTINEL, EXCISION:  ?- Lymph node, negative for carcinoma (0/1)  ? ?D. LYMPH NODE, RIGHT AXILLARY, SENTINEL, EXCISION:  ?- Lymph node, negative for carcinoma (0/1)  ? ?E. LYMPH NODE, RIGHT AXILLARY #3, SENTINEL, EXCISION:  ?- Lymph node, negative for carcinoma (0/1)  ? ?F. LYMPH NODE, RIGHT AXILLARY, SENTINEL, EXCISION:  ?- Lymph node, negative for carcinoma (0/1)  ? ?  ?02/18/2021 Oncotype testing  ? Oncotype DX was obtained on the final surgical sample and the recurrence score of 0 predicts a risk of recurrence outside the breast over the next 9 years of 3%, if the patient's only systemic therapy is an antiestrogen for 5 years.  It also predicts no benefit from chemotherapy. ?  ?02/27/2021 Cancer Staging  ? Staging form: Breast, AJCC 8th Edition ?- Pathologic: Stage IA (pT1c, pN0, cM0, G2, ER+, PR+, HER2-) - Signed by Gardenia Phlegm, NP on 02/27/2021 ?Histologic grading system: 3 grade system ? ?  ?03/2021 - 04/2021 Radiation  Therapy  ? Completed adjuvant Radiation by Dr. Isidore Moos ?  ?05/2021 -  Neo-Adjuvant Anti-estrogen oral therapy  ? Began tamoxifen mid July 2022 ?  ?07/29/2021 Survivorship  ? SCP delivered by Cira Rue, NP ?  ? ? ? ?INTERVAL HISTORY:  ?Kayla Bennett is here for a follow up of breast cancer. She was last seen by PA Cassie on 10/25/21. She presents to the clinic alone. ?She reports she is doing well  overall, no new concerns. She tell me about her admission in 09/2021 for sepsis and A.fib ablation in 11/2021. She notes she is back to work now. ?  ?All other systems were reviewed with the patient and are neg

## 2022-02-25 ENCOUNTER — Ambulatory Visit: Payer: Medicare Other | Admitting: Physical Therapy

## 2022-02-25 ENCOUNTER — Other Ambulatory Visit (HOSPITAL_COMMUNITY): Payer: Self-pay

## 2022-02-25 ENCOUNTER — Encounter: Payer: Self-pay | Admitting: Physical Therapy

## 2022-02-25 DIAGNOSIS — R262 Difficulty in walking, not elsewhere classified: Secondary | ICD-10-CM | POA: Diagnosis not present

## 2022-02-25 DIAGNOSIS — C50411 Malignant neoplasm of upper-outer quadrant of right female breast: Secondary | ICD-10-CM | POA: Diagnosis not present

## 2022-02-25 DIAGNOSIS — M6281 Muscle weakness (generalized): Secondary | ICD-10-CM

## 2022-02-25 DIAGNOSIS — R293 Abnormal posture: Secondary | ICD-10-CM | POA: Diagnosis not present

## 2022-02-25 DIAGNOSIS — M25611 Stiffness of right shoulder, not elsewhere classified: Secondary | ICD-10-CM | POA: Diagnosis not present

## 2022-02-25 DIAGNOSIS — Z170 Estrogen receptor positive status [ER+]: Secondary | ICD-10-CM | POA: Diagnosis not present

## 2022-02-25 MED ORDER — POTASSIUM CHLORIDE CRYS ER 20 MEQ PO TBCR: 20.0000 meq | EXTENDED_RELEASE_TABLET | Freq: Every day | ORAL | 6 refills | Status: DC

## 2022-02-25 NOTE — Therapy (Signed)
?OUTPATIENT PHYSICAL THERAPY TREATMENT NOTE ? ? ?Patient Name: Kayla CASARES ?MRN: [id number] ?DOB:April 27, 1951, 71 y.o., female ?Today's Date: 02/25/2022 ? ?PCP: [name], MD ?REFERRING PROVIDER: [name], MD ? ? PT End of Session - 02/25/22 1455   ? ? Visit Number 25   add kx  ? Number of Visits 29   ? Date for PT Re-Evaluation 03/25/22   ? Authorization Type 100% coverage   ? PT Start Time 4920   ? PT Stop Time 1551   ? PT Time Calculation (min) 58 min   ? Activity Tolerance Patient tolerated treatment well   ? Behavior During Therapy [facility] for tasks assessed/performed   ? ?  ?  ? ?  ? ? ? ?Past Medical History:  ?Diagnosis Date  ? Anemia   ? Arthritis   ? Asthma   ? Breast cancer ([place])   ? Diabetes ([place])   ? Dyspnea   ? Dysrhythmia   ? afibb on occasion  ? Family history of adverse reaction to anesthesia   ? mother had reaction to versed...during surgery, stopped breathing  ? Fibromyalgia   ? GERD (gastroesophageal reflux disease)   ? Headache   ? History of hiatal hernia   ? Hypertension   ? Migraines   ? Osteopenia   ? Psoriasis   ? Yeast infection 10/30/2021  ? ?Past Surgical History:  ?Procedure Laterality Date  ? ABDOMINAL HYSTERECTOMY    ? APPENDECTOMY    ? ATRIAL FIBRILLATION ABLATION N/A 12/10/2021  ? Procedure: ATRIAL FIBRILLATION ABLATION;  Surgeon: Constance Haw, MD;  Location: [place] [facility];  Service: Cardiovascular;  Laterality: N/A;  ? BREAST LUMPECTOMY WITH RADIOACTIVE SEED AND SENTINEL LYMPH NODE BIOPSY Right 02/18/2021  ? Procedure: RIGHT BREAST LUMPECTOMY WITH RADIOACTIVE SEED AND SENTINEL LYMPH NODE BIOPSY;  Surgeon: [name], MD;  Location: [place];  Service: General;  Laterality: Right;  ? broken wrist    ? CARDIOVERSION N/A 04/04/2021  ? Procedure: CARDIOVERSION;  Surgeon: Sueanne Margarita, MD;  Location: [place];  Service: Cardiovascular;  Laterality: N/A;  ? CARDIOVERSION N/A 08/14/2021  ? Procedure: CARDIOVERSION;  Surgeon: Geralynn Rile, MD;  Location:  [place];  Service: Cardiovascular;  Laterality: N/A;  ? CARDIOVERSION N/A 09/19/2021  ? Procedure: CARDIOVERSION;  Surgeon: Geralynn Rile, MD;  Location: [place];  Service: Cardiovascular;  Laterality: N/A;  ? [place]  ? CHOLECYSTECTOMY    ? FRACTURE SURGERY    ? right wrist  ? HYSTERECTOMY ABDOMINAL WITH SALPINGO-OOPHORECTOMY    ? LYSIS OF ADHESION    ? SHOULDER SURGERY Left   ? TUBAL LIGATION    ? ?Patient Active Problem List  ? Diagnosis Date Noted  ? Yeast infection 10/30/2021  ? Breast abscess   ? Sepsis ([place]) 10/12/2021  ? Fever 10/12/2021  ? Nausea 10/12/2021  ? Headache 10/12/2021  ? Generalized weakness 10/12/2021  ? Diabetes ([place])   ? Hypertension   ? GERD (gastroesophageal reflux disease)   ? Iron deficiency anemia 09/25/2021  ? Secondary hypercoagulable state ([place]) 07/26/2021  ? Persistent atrial fibrillation ([place])   ? Genetic testing 12/26/2020  ? Malignant neoplasm of upper-outer quadrant of right breast in female, estrogen receptor positive ([place]) 11/28/2020  ? ? ?REFERRING DIAG: Rt breast cancer, muscle weakness  ? ?THERAPY DIAG:  ?Muscle weakness (generalized) ? ?Difficulty in walking, not elsewhere classified ? ?Stiffness of right shoulder, not elsewhere classified ? ?Abnormal posture ? ?Malignant neoplasm of  upper-outer quadrant of right breast in female, estrogen receptor positive ([place]) ? ?PERTINENT HISTORY: Patient was diagnosed on 11/22/2020 with right invasive ductal carcinoma breast cancer. She underwent a right lumpectomy and sentinel node biopsy (5 negative nodes) on 02/18/2021. It is ER/PR positive and HER2 negative with a Ki67 of 10%. She has many orthopedic problems from a traumatic fall and other accidents, pt has had 3 cardioversions this year for atrial fib on 04/04/21, 08/14/21 and 09/19/21, has hx of rotator cuff surgery on the R  ? ?PRECAUTIONS: A-fib, recent cardioablation, lymphedema Risk Rt UE ? ?SUBJECTIVE: Today is a pretty good day.  ?PAIN:   ?Are you having pain? Yes 2/10, achy, knees ? ?TODAY'S TREATMENT  ? ?02/25/22 ?-Gait training:  ?-practiced ambulating up/down ramp with no assistive device since pt tends to trip on ramp - used CGA with gait belt today and pt did not have any losses of balance. Also practiced going up/down 3 steps without using the rail or an assistive device with verbal cues to keep pace up and not to stop prior to stair ambulation. She demonstrated improved ability to ascend/descend steps and did not look off balance and she was not using any HHA. Pt did best when ascending leading with the right leg and descending with the L leg.  ?Therapeutic Exercise: ?Nustep LE only level 5x 10 min, seat at 7; Steps: 598 ?Seated: LAQ with 3 lb weights 3 sec holds x 10 reps each, marching x 10 reps each with 3 lb weights ?Supine: SAQ with 3 lb weights x 3 sec holds x 10 reps each; bridging x Neuromuscular Reeducation ?In parallel bars:  ?Standing on rocker board: forward and back x 10 reps with hand held assist on bars then side to side with CGA assist on gait belt and occasional HHA on bar ?High marches on air ex with // bars for support ?Ambulating over 4 hurdles in // bars using CGA on gait belt with step to then step through gait pattern with pt having increased difficulty with step to ? ?02/17/22 ?-Gait training:  ?-practiced ambulating up/down ramp with no assistive device since pt tends to trip on ramp - used CGA with gait belt today and pt did not have any losses of balance.  ?Therapeutic Exercise: ?Nustep LE only level 5x 10 min, seat at 7; Steps: 520 ?Supine: SAQ with 3 lb weights x 3 sec holds x 10 reps each; bridging x 10 reps each with 3 sec holds, SLR x 10 reps bilaterally easier on L ?S/L: clams x 10 reps bilaterally  with red theraband ?Neuromuscular Reeducation ?In parallel bars:  ?Standing on rocker board: forward and back x 10 reps with hand held assist on bars ?Step ups and hold on air ex x 10 bilaterally ?Standing in middle  of colored circles with green cones having pt tap appropriate color without smashing cone - pt had increased difficulty stepping backwards and stepping with the L foot ?Weaving in and out of cones to change directions while maintaining balance, pt required CGA with gait belt ? ?02/13/22 ?-Gait training:  ?-Steps outside going up/down 1 step and educating pt on correct way to ascend/descend steps using straight cane and how to sequence steps with pt still requiring cueing for correct management of cane ?-practiced ambulating up/down ramp while using straight cane since pt tends to trip on ramp - pt lost balance a few times to the side and was able to self correct using the hand rail. She has difficulty  on ramps and uneven surfaces ? ?Therapeutic Exercise: ?Nustep LE only level 5x 2 min then decreased to level 4 x 8 min, seat at 7; Steps: 573 ?Supine: SAQ with 2 lb weights x 5 sec holds x 10 reps each; bridging x 10 reps each with 3 sec holds ?S/L: clams x 10 reps bilaterally  with red theraband ?Neuromuscular Reeducation ?In parallel bars:  ?Standing on air ex with eyes open with increased sway noted and occasional use of hand rails for balance ?Standing on air ex with eyes closed with increased sway and reliance on handrails for assist ?Step ups on to air ex x 10 reps ? Marching on air ex x 10 reps ?02/11/22 ?-Gait training:  ?Working on stair ambulation: up/down curb x 4 with pt having increased difficulty without UE support or hand rails ?-Up/down 5 steps with bilateral hand rails x 10 with pt going from step to gait to step through improving independence and form with pt reporting quad fatigue ?-Steps outside with 1 hand rail with pt feeling most safe going down sideways, educated pt to go down frontwards but be intentional with steps ?- used straight cane to have practice ambulate up/down 1 step and curb while instructing pt in correct technique for using the cane with pt demonstrating improved  technique ?-practiced ambulating up/down ramp while using straight cane since pt tends to trip on ramp - pt did drag foot once - educated pt to lift leg up higher to clear ground ? ?Therapeutic Exercise: ?Nustep LE only

## 2022-02-27 ENCOUNTER — Ambulatory Visit: Payer: Medicare Other | Admitting: Physical Therapy

## 2022-02-27 ENCOUNTER — Encounter: Payer: Self-pay | Admitting: Physical Therapy

## 2022-02-27 DIAGNOSIS — R262 Difficulty in walking, not elsewhere classified: Secondary | ICD-10-CM | POA: Diagnosis not present

## 2022-02-27 DIAGNOSIS — M6281 Muscle weakness (generalized): Secondary | ICD-10-CM

## 2022-02-27 DIAGNOSIS — Z170 Estrogen receptor positive status [ER+]: Secondary | ICD-10-CM | POA: Diagnosis not present

## 2022-02-27 DIAGNOSIS — R293 Abnormal posture: Secondary | ICD-10-CM | POA: Diagnosis not present

## 2022-02-27 DIAGNOSIS — M25611 Stiffness of right shoulder, not elsewhere classified: Secondary | ICD-10-CM | POA: Diagnosis not present

## 2022-02-27 DIAGNOSIS — C50411 Malignant neoplasm of upper-outer quadrant of right female breast: Secondary | ICD-10-CM | POA: Diagnosis not present

## 2022-02-27 NOTE — Therapy (Signed)
?OUTPATIENT PHYSICAL THERAPY TREATMENT NOTE ? ? ?Patient Name: Kayla Bennett ?MRN: [id number] ?DOB:[birth year], 71 y.o., female ?Today's Date: 02/27/2022 ? ?PCP: [name], MD ?REFERRING PROVIDER: [name], MD ? ? PT End of Session - 02/27/22 0904   ? ? Visit Number 26   add kx  ? Number of Visits 30   ? Date for PT Re-Evaluation 03/25/22   ? PT Start Time 0902   ? PT Stop Time [phone]   ? PT Time Calculation (min) 49 min   ? Activity Tolerance Patient tolerated treatment well   ? Behavior During Therapy [facility] for tasks assessed/performed   ? ?  ?  ? ?  ? ? ? ?Past Medical History:  ?Diagnosis Date  ? Anemia   ? Arthritis   ? Asthma   ? Breast cancer ([place])   ? Diabetes ([place])   ? Dyspnea   ? Dysrhythmia   ? afibb on occasion  ? Family history of adverse reaction to anesthesia   ? mother had reaction to versed...during surgery, stopped breathing  ? Fibromyalgia   ? GERD (gastroesophageal reflux disease)   ? Headache   ? History of hiatal hernia   ? Hypertension   ? Migraines   ? Osteopenia   ? Psoriasis   ? Yeast infection 10/30/2021  ? ?Past Surgical History:  ?Procedure Laterality Date  ? ABDOMINAL HYSTERECTOMY    ? APPENDECTOMY    ? ATRIAL FIBRILLATION ABLATION N/A 12/10/2021  ? Procedure: ATRIAL FIBRILLATION ABLATION;  Surgeon: Constance Haw, MD;  Location: [place] [facility];  Service: Cardiovascular;  Laterality: N/A;  ? BREAST LUMPECTOMY WITH RADIOACTIVE SEED AND SENTINEL LYMPH NODE BIOPSY Right 02/18/2021  ? Procedure: RIGHT BREAST LUMPECTOMY WITH RADIOACTIVE SEED AND SENTINEL LYMPH NODE BIOPSY;  Surgeon: [name], MD;  Location: [place];  Service: General;  Laterality: Right;  ? broken wrist    ? CARDIOVERSION N/A 04/04/2021  ? Procedure: CARDIOVERSION;  Surgeon: Sueanne Margarita, MD;  Location: [place];  Service: Cardiovascular;  Laterality: N/A;  ? CARDIOVERSION N/A 08/14/2021  ? Procedure: CARDIOVERSION;  Surgeon: Geralynn Rile, MD;  Location: [place];  Service:  Cardiovascular;  Laterality: N/A;  ? CARDIOVERSION N/A 09/19/2021  ? Procedure: CARDIOVERSION;  Surgeon: Geralynn Rile, MD;  Location: [place];  Service: Cardiovascular;  Laterality: N/A;  ? [place]  ? CHOLECYSTECTOMY    ? FRACTURE SURGERY    ? right wrist  ? HYSTERECTOMY ABDOMINAL WITH SALPINGO-OOPHORECTOMY    ? LYSIS OF ADHESION    ? SHOULDER SURGERY Left   ? TUBAL LIGATION    ? ?Patient Active Problem List  ? Diagnosis Date Noted  ? Yeast infection 10/30/2021  ? Breast abscess   ? Sepsis ([place]) 10/12/2021  ? Fever 10/12/2021  ? Nausea 10/12/2021  ? Headache 10/12/2021  ? Generalized weakness 10/12/2021  ? Diabetes ([place])   ? Hypertension   ? GERD (gastroesophageal reflux disease)   ? Iron deficiency anemia 09/25/2021  ? Secondary hypercoagulable state ([place]) 07/26/2021  ? Persistent atrial fibrillation ([place])   ? Genetic testing 12/26/2020  ? Malignant neoplasm of upper-outer quadrant of right breast in female, estrogen receptor positive ([place]) 11/28/2020  ? ? ?REFERRING DIAG: Rt breast cancer, muscle weakness  ? ?THERAPY DIAG:  ?Muscle weakness (generalized) ? ?Difficulty in walking, not elsewhere classified ? ?Stiffness of right shoulder, not elsewhere classified ? ?Abnormal posture ? ?Malignant neoplasm of upper-outer quadrant of right breast in female,  estrogen receptor positive ([place]) ? ?PERTINENT HISTORY: Patient was diagnosed on 11/22/2020 with right invasive ductal carcinoma breast cancer. She underwent a right lumpectomy and sentinel node biopsy (5 negative nodes) on 02/18/2021. It is ER/PR positive and HER2 negative with a Ki67 of 10%. She has many orthopedic problems from a traumatic fall and other accidents, pt has had 3 cardioversions this year for atrial fib on 04/04/21, 08/14/21 and 09/19/21, has hx of rotator cuff surgery on the R  ? ?PRECAUTIONS: A-fib, recent cardioablation, lymphedema Risk Rt UE ? ?SUBJECTIVE: Today is not my best day.  ?PAIN:  ?Are you having pain? Yes  4/10, pt reports discomfort and achiness from fibromyalgia; knee R and L 2/10 ? ?TODAY'S TREATMENT  ? ?02/27/22 ?Therapeutic Exercise: ?Nustep LE only level 5x 10 min 22 sec, seat at 7; Steps: 660 ?Seated: LAQ with 3 lb weights 3 sec holds x 10 reps each, marching x 10 reps each with 3 lb weights ?3 way hip with 3 lbs on [place] Cross machine with pt feeling fatigued after this and requiring seated recovery period x 10 reps in direction of flexion, abduction and extension with v/c to keep knee straight ?Neuromuscular Reeducation ?In parallel bars:  ?High marches on [place] x 10 with // bars for support ?Standing on [place] x 2 min with increased sway but no LOB with no HHA ?Forward step ups and lateral step ups on air ex x 10 reps each with pt having several losses of balance due to not taking large enough steps ?Heel/toe walking x 4 length of // bars with no HHA and some LOB that pt was able to correct ?Braiding x 4 length of // bars with no HHA and v/c for intentional steps to prevent LOB ?Standing in // and reaching out for cones to work on weight shifting and maintaining balance when reaching out of BOS x 10 ? ?02/25/22 ?-Gait training:  ?-practiced ambulating up/down ramp with no assistive device since pt tends to trip on ramp - used CGA with gait belt today and pt did not have any losses of balance. Also practiced going up/down 3 steps without using the rail or an assistive device with verbal cues to keep pace up and not to stop prior to stair ambulation. She demonstrated improved ability to ascend/descend steps and did not look off balance and she was not using any HHA. Pt did best when ascending leading with the right leg and descending with the L leg.  ?Therapeutic Exercise: ?Nustep LE only level 5x 10 min, seat at 7; Steps: 598 ?Seated: LAQ with 3 lb weights 3 sec holds x 10 reps each, marching x 10 reps each with 3 lb weights ?Supine: SAQ with 3 lb weights x 3 sec holds x 10 reps each; bridging x  Neuromuscular Reeducation ?In parallel bars:  ?Standing on rocker board: forward and back x 10 reps with hand held assist on bars then side to side with CGA assist on gait belt and occasional HHA on bar ?High marches on air ex with // bars for support ?Ambulating over 4 hurdles in // bars using CGA on gait belt with step to then step through gait pattern with pt having increased difficulty with step to ? ?02/17/22 ?-Gait training:  ?-practiced ambulating up/down ramp with no assistive device since pt tends to trip on ramp - used CGA with gait belt today and pt did not have any losses of balance.  ?Therapeutic Exercise: ?Nustep LE only level 5x 10  min, seat at 7; Steps: 520 ?Supine: SAQ with 3 lb weights x 3 sec holds x 10 reps each; bridging x 10 reps each with 3 sec holds, SLR x 10 reps bilaterally easier on L ?S/L: clams x 10 reps bilaterally  with red theraband ?Neuromuscular Reeducation ?In parallel bars:  ?Standing on rocker board: forward and back x 10 reps with hand held assist on bars ?Step ups and hold on air ex x 10 bilaterally ?Standing in middle of colored circles with green cones having pt tap appropriate color without smashing cone - pt had increased difficulty stepping backwards and stepping with the L foot ?Weaving in and out of cones to change directions while maintaining balance, pt required CGA with gait belt ? ?02/13/22 ?-Gait training:  ?-Steps outside going up/down 1 step and educating pt on correct way to ascend/descend steps using straight cane and how to sequence steps with pt still requiring cueing for correct management of cane ?-practiced ambulating up/down ramp while using straight cane since pt tends to trip on ramp - pt lost balance a few times to the side and was able to self correct using the hand rail. She has difficulty on ramps and uneven surfaces ? ?Therapeutic Exercise: ?Nustep LE only level 5x 2 min then decreased to level 4 x 8 min, seat at 7; Steps: 573 ?Supine: SAQ with 2 lb  weights x 5 sec holds x 10 reps each; bridging x 10 reps each with 3 sec holds ?S/L: clams x 10 reps bilaterally  with red theraband ?Neuromuscular Reeducation ?In parallel bars:  ?Standing on air ex with eyes open wit

## 2022-03-06 ENCOUNTER — Ambulatory Visit: Payer: Medicare Other

## 2022-03-06 DIAGNOSIS — R262 Difficulty in walking, not elsewhere classified: Secondary | ICD-10-CM | POA: Diagnosis not present

## 2022-03-06 DIAGNOSIS — M25611 Stiffness of right shoulder, not elsewhere classified: Secondary | ICD-10-CM | POA: Diagnosis not present

## 2022-03-06 DIAGNOSIS — Z170 Estrogen receptor positive status [ER+]: Secondary | ICD-10-CM

## 2022-03-06 DIAGNOSIS — C50411 Malignant neoplasm of upper-outer quadrant of right female breast: Secondary | ICD-10-CM | POA: Diagnosis not present

## 2022-03-06 DIAGNOSIS — R293 Abnormal posture: Secondary | ICD-10-CM | POA: Diagnosis not present

## 2022-03-06 DIAGNOSIS — M6281 Muscle weakness (generalized): Secondary | ICD-10-CM

## 2022-03-06 NOTE — Therapy (Signed)
?OUTPATIENT PHYSICAL THERAPY TREATMENT NOTE ? ? ?Patient Name: Kayla Bennett ?MRN: [id number] ?DOB:[birth year], 71 y.o., female ?Today's Date: 03/06/2022 ? ?PCP: [name], MD ?REFERRING PROVIDER: [name], MD ? ? PT End of Session - 03/06/22 1159   ? ? Visit Number 27   add kx  ? Number of Visits 30   ? Date for PT Re-Evaluation 03/25/22   ? Authorization Type 100% coverage   ? PT Start Time 1155   ? PT Stop Time 1235   ? PT Time Calculation (min) 40 min   ? Activity Tolerance Patient tolerated treatment well;Patient limited by pain   ? Behavior During Therapy [facility] for tasks assessed/performed   ? ?  ?  ? ?  ? ? ? ?Past Medical History:  ?Diagnosis Date  ? Anemia   ? Arthritis   ? Asthma   ? Breast cancer ([place])   ? Diabetes ([place])   ? Dyspnea   ? Dysrhythmia   ? afibb on occasion  ? Family history of adverse reaction to anesthesia   ? mother had reaction to versed...during surgery, stopped breathing  ? Fibromyalgia   ? GERD (gastroesophageal reflux disease)   ? Headache   ? History of hiatal hernia   ? Hypertension   ? Migraines   ? Osteopenia   ? Psoriasis   ? Yeast infection 10/30/2021  ? ?Past Surgical History:  ?Procedure Laterality Date  ? ABDOMINAL HYSTERECTOMY    ? APPENDECTOMY    ? ATRIAL FIBRILLATION ABLATION N/A 12/10/2021  ? Procedure: ATRIAL FIBRILLATION ABLATION;  Surgeon: Constance Haw, MD;  Location: [place] [facility];  Service: Cardiovascular;  Laterality: N/A;  ? BREAST LUMPECTOMY WITH RADIOACTIVE SEED AND SENTINEL LYMPH NODE BIOPSY Right 02/18/2021  ? Procedure: RIGHT BREAST LUMPECTOMY WITH RADIOACTIVE SEED AND SENTINEL LYMPH NODE BIOPSY;  Surgeon: [name], MD;  Location: [place];  Service: General;  Laterality: Right;  ? broken wrist    ? CARDIOVERSION N/A 04/04/2021  ? Procedure: CARDIOVERSION;  Surgeon: Sueanne Margarita, MD;  Location: [place];  Service: Cardiovascular;  Laterality: N/A;  ? CARDIOVERSION N/A 08/14/2021  ? Procedure: CARDIOVERSION;  Surgeon: Geralynn Rile, MD;  Location: [place];  Service: Cardiovascular;  Laterality: N/A;  ? CARDIOVERSION N/A 09/19/2021  ? Procedure: CARDIOVERSION;  Surgeon: Geralynn Rile, MD;  Location: [place];  Service: Cardiovascular;  Laterality: N/A;  ? [place]  ? CHOLECYSTECTOMY    ? FRACTURE SURGERY    ? right wrist  ? HYSTERECTOMY ABDOMINAL WITH SALPINGO-OOPHORECTOMY    ? LYSIS OF ADHESION    ? SHOULDER SURGERY Left   ? TUBAL LIGATION    ? ?Patient Active Problem List  ? Diagnosis Date Noted  ? Yeast infection 10/30/2021  ? Breast abscess   ? Sepsis ([place]) 10/12/2021  ? Fever 10/12/2021  ? Nausea 10/12/2021  ? Headache 10/12/2021  ? Generalized weakness 10/12/2021  ? Diabetes ([place])   ? Hypertension   ? GERD (gastroesophageal reflux disease)   ? Iron deficiency anemia 09/25/2021  ? Secondary hypercoagulable state ([place]) 07/26/2021  ? Persistent atrial fibrillation ([place])   ? Genetic testing 12/26/2020  ? Malignant neoplasm of upper-outer quadrant of right breast in female, estrogen receptor positive ([place]) 11/28/2020  ? ? ?REFERRING DIAG: Rt breast cancer, muscle weakness  ? ?THERAPY DIAG:  ?Muscle weakness (generalized) ? ?Difficulty in walking, not elsewhere classified ? ?Stiffness of right shoulder, not elsewhere classified ? ?Abnormal posture ? ?  Malignant neoplasm of upper-outer quadrant of right breast in female, estrogen receptor positive ([place]) ? ?PERTINENT HISTORY: Patient was diagnosed on 11/22/2020 with right invasive ductal carcinoma breast cancer. She underwent a right lumpectomy and sentinel node biopsy (5 negative nodes) on 02/18/2021. It is ER/PR positive and HER2 negative with a Ki67 of 10%. She has many orthopedic problems from a traumatic fall and other accidents, pt has had 3 cardioversions this year for atrial fib on 04/04/21, 08/14/21 and 09/19/21, has hx of rotator cuff surgery on the R  ? ?PRECAUTIONS: A-fib, recent cardioablation, lymphedema Risk Rt UE ? ?SUBJECTIVE: I've  done something to my Rt knee and feel like I tweaked it. It's been bothering me since Sunday and I almost cancelled today but wanted to try to do some of the exercises.  ? ?PAIN:  ?Are you having pain? Yes: NPRS scale: 5/10 ?Pain location: Rt medial to the patella ?Pain description: sharp with standing then dull ?Aggravating factors: sit-stand ?Relieving factors: Tylenol   ? ? ? ?TODAY'S TREATMENT  ?Nustep LE only level 5x 10 min 11 sec, seat at 7; Steps: 640, slower pace due to knee discomfort ?Seated: Lt LAQ with 3 lb weights 3 sec holds x 10 reps each, unable to do with Rt today due to pain, alt marching x 10 reps each with 3 lb weights on Lt ?Hip add purple ball squeeze x20, 3 sec holds ?In Supine: Bridging 2x10 with VCs for controlled eccentric contraction  ?SLR Rt then Lt x10 reps, no pain in Rt LE with SLR but painful transitioning into and out of knee flex/ext;  ?Posterior pelvic tilt x12, then with holding tilt: open/close knees x10  ? ? ?02/27/22 ?Therapeutic Exercise: ?Nustep LE only level 5x 10 min 22 sec, seat at 7; Steps: 660 ?Seated: LAQ with 3 lb weights 3 sec holds x 10 reps each, marching x 10 reps each with 3 lb weights ?3 way hip with 3 lbs on Dual Cross machine with pt feeling fatigued after this and requiring seated recovery period x 10 reps in direction of flexion, abduction and extension with v/c to keep knee straight ?Neuromuscular Reeducation ?In parallel bars:  ?High marches on cobble stone x 10 with // bars for support ?Standing on cobble stone x 2 min with increased sway but no LOB with no HHA ?Forward step ups and lateral step ups on air ex x 10 reps each with pt having several losses of balance due to not taking large enough steps ?Heel/toe walking x 4 length of // bars with no HHA and some LOB that pt was able to correct ?Braiding x 4 length of // bars with no HHA and v/c for intentional steps to prevent LOB ?Standing in // and reaching out for cones to work on weight shifting and  maintaining balance when reaching out of BOS x 10 ? ?02/25/22 ?-Gait training:  ?-practiced ambulating up/down ramp with no assistive device since pt tends to trip on ramp - used CGA with gait belt today and pt did not have any losses of balance. Also practiced going up/down 3 steps without using the rail or an assistive device with verbal cues to keep pace up and not to stop prior to stair ambulation. She demonstrated improved ability to ascend/descend steps and did not look off balance and she was not using any HHA. Pt did best when ascending leading with the right leg and descending with the L leg.  ?Therapeutic Exercise: ?Nustep LE only level 5x 10  min, seat at 7; Steps: 598 ?Seated: LAQ with 3 lb weights 3 sec holds x 10 reps each, marching x 10 reps each with 3 lb weights ?Supine: SAQ with 3 lb weights x 3 sec holds x 10 reps each; bridging x  ?Neuromuscular Reeducation ?In parallel bars:  ?Standing on rocker board: forward and back x 10 reps with hand held assist on bars then side to side with CGA assist on gait belt and occasional HHA on bar ?High marches on air ex with // bars for support ?Ambulating over 4 hurdles in // bars using CGA on gait belt with step to then step through gait pattern with pt having increased difficulty with step to ? ?02/17/22 ?-Gait training:  ?-practiced ambulating up/down ramp with no assistive device since pt tends to trip on ramp - used CGA with gait belt today and pt did not have any losses of balance.  ?Therapeutic Exercise: ?Nustep LE only level 5x 10 min, seat at 7; Steps: 520 ?Supine: SAQ with 3 lb weights x 3 sec holds x 10 reps each; bridging x 10 reps each with 3 sec holds, SLR x 10 reps bilaterally easier on L ?S/L: clams x 10 reps bilaterally  with red theraband ?Neuromuscular Reeducation ?In parallel bars:  ?Standing on rocker board: forward and back x 10 reps with hand held assist on bars ?Step ups and hold on air ex x 10 bilaterally ?Standing in middle of colored  circles with green cones having pt tap appropriate color without smashing cone - pt had increased difficulty stepping backwards and stepping with the L foot ?Weaving in and out of cones to change directions while [employer]

## 2022-03-10 ENCOUNTER — Ambulatory Visit: Payer: Medicare Other

## 2022-03-10 DIAGNOSIS — M6281 Muscle weakness (generalized): Secondary | ICD-10-CM | POA: Diagnosis not present

## 2022-03-10 DIAGNOSIS — M25611 Stiffness of right shoulder, not elsewhere classified: Secondary | ICD-10-CM

## 2022-03-10 DIAGNOSIS — C50411 Malignant neoplasm of upper-outer quadrant of right female breast: Secondary | ICD-10-CM

## 2022-03-10 DIAGNOSIS — Z170 Estrogen receptor positive status [ER+]: Secondary | ICD-10-CM | POA: Diagnosis not present

## 2022-03-10 DIAGNOSIS — R262 Difficulty in walking, not elsewhere classified: Secondary | ICD-10-CM | POA: Diagnosis not present

## 2022-03-10 DIAGNOSIS — R293 Abnormal posture: Secondary | ICD-10-CM

## 2022-03-10 NOTE — Therapy (Signed)
?OUTPATIENT PHYSICAL THERAPY TREATMENT NOTE ? ? ?Patient Name: Kayla Bennett ?MRN: [id number] ?DOB:04/09/1951, 71 y.o., female ?Today's Date: 03/10/2022 ? ?PCP: [name], MD ?REFERRING PROVIDER: [name], MD ? ? PT End of Session - 03/10/22 1406   ? ? Visit Number 28   add kx  ? Number of Visits 30   ? Date for PT Re-Evaluation 03/25/22   ? Authorization Type 100% coverage   ? PT Start Time 1402   ? PT Stop Time 1455   ? PT Time Calculation (min) 53 min   ? Activity Tolerance Patient tolerated treatment well   ? Behavior During Therapy [facility] for tasks assessed/performed   ? ?  ?  ? ?  ? ? ? ?Past Medical History:  ?Diagnosis Date  ? Anemia   ? Arthritis   ? Asthma   ? Breast cancer ([place])   ? Diabetes ([place])   ? Dyspnea   ? Dysrhythmia   ? afibb on occasion  ? Family history of adverse reaction to anesthesia   ? mother had reaction to versed...during surgery, stopped breathing  ? Fibromyalgia   ? GERD (gastroesophageal reflux disease)   ? Headache   ? History of hiatal hernia   ? Hypertension   ? Migraines   ? Osteopenia   ? Psoriasis   ? Yeast infection 10/30/2021  ? ?Past Surgical History:  ?Procedure Laterality Date  ? ABDOMINAL HYSTERECTOMY    ? APPENDECTOMY    ? ATRIAL FIBRILLATION ABLATION N/A 12/10/2021  ? Procedure: ATRIAL FIBRILLATION ABLATION;  Surgeon: Constance Haw, MD;  Location: [place] [facility];  Service: Cardiovascular;  Laterality: N/A;  ? BREAST LUMPECTOMY WITH RADIOACTIVE SEED AND SENTINEL LYMPH NODE BIOPSY Right 02/18/2021  ? Procedure: RIGHT BREAST LUMPECTOMY WITH RADIOACTIVE SEED AND SENTINEL LYMPH NODE BIOPSY;  Surgeon: [name], MD;  Location: [place];  Service: General;  Laterality: Right;  ? broken wrist    ? CARDIOVERSION N/A 04/04/2021  ? Procedure: CARDIOVERSION;  Surgeon: Sueanne Margarita, MD;  Location: [place];  Service: Cardiovascular;  Laterality: N/A;  ? CARDIOVERSION N/A 08/14/2021  ? Procedure: CARDIOVERSION;  Surgeon: Geralynn Rile, MD;  Location:  [place];  Service: Cardiovascular;  Laterality: N/A;  ? CARDIOVERSION N/A 09/19/2021  ? Procedure: CARDIOVERSION;  Surgeon: Geralynn Rile, MD;  Location: [place];  Service: Cardiovascular;  Laterality: N/A;  ? [place]  ? CHOLECYSTECTOMY    ? FRACTURE SURGERY    ? right wrist  ? HYSTERECTOMY ABDOMINAL WITH SALPINGO-OOPHORECTOMY    ? LYSIS OF ADHESION    ? SHOULDER SURGERY Left   ? TUBAL LIGATION    ? ?Patient Active Problem List  ? Diagnosis Date Noted  ? Yeast infection 10/30/2021  ? Breast abscess   ? Sepsis ([place]) 10/12/2021  ? Fever 10/12/2021  ? Nausea 10/12/2021  ? Headache 10/12/2021  ? Generalized weakness 10/12/2021  ? Diabetes ([place])   ? Hypertension   ? GERD (gastroesophageal reflux disease)   ? Iron deficiency anemia 09/25/2021  ? Secondary hypercoagulable state ([place]) 07/26/2021  ? Persistent atrial fibrillation ([place])   ? Genetic testing 12/26/2020  ? Malignant neoplasm of upper-outer quadrant of right breast in female, estrogen receptor positive ([place]) 11/28/2020  ? ? ?REFERRING DIAG: Rt breast cancer, muscle weakness  ? ?THERAPY DIAG:  ?Muscle weakness (generalized) ? ?Difficulty in walking, not elsewhere classified ? ?Stiffness of right shoulder, not elsewhere classified ? ?Abnormal posture ? ?Malignant neoplasm of  upper-outer quadrant of right breast in female, estrogen receptor positive ([place]) ? ?PERTINENT HISTORY: Patient was diagnosed on 11/22/2020 with right invasive ductal carcinoma breast cancer. She underwent a right lumpectomy and sentinel node biopsy (5 negative nodes) on 02/18/2021. It is ER/PR positive and HER2 negative with a Ki67 of 10%. She has many orthopedic problems from a traumatic fall and other accidents, pt has had 3 cardioversions this year for atrial fib on 04/04/21, 08/14/21 and 09/19/21, has hx of rotator cuff surgery on the R  ? ?PRECAUTIONS: A-fib, recent cardioablation, lymphedema Risk Rt UE ? ?SUBJECTIVE: My knee feels so much better than last  time.   ? ?PAIN:  ?Are you having pain? No, just discomfort when I first stand up ? ? ?TODAY'S TREATMENT  ?03/10/22: ?Therapeutic Ex: ?Nustep LE only level 5x 10 min 9 sec, seat at 7; Steps: 676 ?Neuromuscular Re-education ?In parallel bars:  ?High marches on cobble stone x 10 with // bars for support ?Standing on cobble stone x 2 min with increased sway but no LOB with no HHA ?Forward step ups  air ex on 2" step x 10 reps each with pt having mild LOBs but able to self correct with bars, encouraged fingertip support to limit this, seated restthen lateral step ups on air ex only x10 each side with +1 HHA to fingertip support ?Heel/toe walking x 6 length of // bars with no HHA and some LOB that pt was able to self correct ?Braiding x 4 length of // bars with fingertip support and pt with improved foot placement today. ?Therapeutic Ex: ?Seated alt marching 3# on each ankle 2x10 ?Alt LAQs 3# on each ankle ?Tried FreeMotion machine for 3 way hip raises: Pt got thru Rt LE 3# ext and flex x5 each, but then pt started feeling lightheaded so opted to end treatment at this time. Her SpO2 was 94% ? ?03/06/22: ?Nustep LE only level 5x 10 min 11 sec, seat at 7; Steps: 640, slower pace due to knee discomfort ?Seated: Lt LAQ with 3 lb weights 3 sec holds x 10 reps each, unable to do with Rt today due to pain, alt marching x 10 reps each with 3 lb weights on Lt ?Hip add purple ball squeeze x20, 3 sec holds ?In Supine: Bridging 2x10 with VCs for controlled eccentric contraction  ?SLR Rt then Lt x10 reps, no pain in Rt LE with SLR but painful transitioning into and out of knee flex/ext;  ?Posterior pelvic tilt x12, then with holding tilt: open/close knees x10  ? ?Neuromuscular Re-education ?In parallel bars:  ?High marches on cobble stone x 10 with // bars for support, challenging for pt and she kept having to stop to step back as she was travelling forward ?Standing on cobble stone x 2 min with increased sway but no LOB with no  HHA ?Forward step ups and lateral step ups on air ex x 10 reps each with pt having several losses of balance due to not taking large enough steps ?Heel/toe walking x 4 length of // bars with no HHA and some LOB that pt was able to correct ?Braiding x 4 length of // bars with no HHA and v/c for intentional steps to prevent LOB ?Standing in // and reaching out for cones to work on weight shifting and maintaining balance when reaching out of BOS x 10 ? ?02/27/22 ?Therapeutic Exercise: ?Nustep LE only level 5x 10 min 22 sec, seat at 7; Steps: 660 ?Seated: LAQ with 3 lb  weights 3 sec holds x 10 reps each, marching x 10 reps each with 3 lb weights ?3 way hip with 3 lbs on Dual Cross machine with pt feeling fatigued after this and requiring seated recovery period x 10 reps in direction of flexion, abduction and extension with v/c to keep knee straight ? ?Neuromuscular Re-education ?In parallel bars:  ?High marches on cobble stone x 10 with // bars for support ?Standing on cobble stone x 2 min with increased sway but no LOB with no HHA ?Forward step ups and lateral step ups on air ex x 10 reps each with pt having several losses of balance due to not taking large enough steps ?Heel/toe walking x 4 length of // bars with no HHA and some LOB that pt was able to correct ?Braiding x 4 length of // bars with no HHA and v/c for intentional steps to prevent LOB ?Standing in // and reaching out for cones to work on weight shifting and maintaining balance when reaching out of BOS x 10 ? ?02/25/22 ?-Gait training:  ?-practiced ambulating up/down ramp with no assistive device since pt tends to trip on ramp - used CGA with gait belt today and pt did not have any losses of balance. Also practiced going up/down 3 steps without using the rail or an assistive device with verbal cues to keep pace up and not to stop prior to stair ambulation. She demonstrated improved ability to ascend/descend steps and did not look off balance and she was not  using any HHA. Pt did best when ascending leading with the right leg and descending with the L leg.  ?Therapeutic Exercise: ?Nustep LE only level 5x 10 min, seat at 7; Steps: 598 ?Seated: LAQ with 3 lb we

## 2022-03-12 DIAGNOSIS — G4733 Obstructive sleep apnea (adult) (pediatric): Secondary | ICD-10-CM

## 2022-03-12 DIAGNOSIS — G4730 Sleep apnea, unspecified: Secondary | ICD-10-CM | POA: Diagnosis not present

## 2022-03-12 DIAGNOSIS — R0683 Snoring: Secondary | ICD-10-CM | POA: Diagnosis not present

## 2022-03-18 ENCOUNTER — Ambulatory Visit: Payer: Medicare Other | Attending: General Surgery | Admitting: Physical Therapy

## 2022-03-18 ENCOUNTER — Encounter: Payer: Self-pay | Admitting: Physical Therapy

## 2022-03-18 DIAGNOSIS — M6281 Muscle weakness (generalized): Secondary | ICD-10-CM | POA: Diagnosis not present

## 2022-03-18 DIAGNOSIS — C50411 Malignant neoplasm of upper-outer quadrant of right female breast: Secondary | ICD-10-CM | POA: Insufficient documentation

## 2022-03-18 DIAGNOSIS — M25611 Stiffness of right shoulder, not elsewhere classified: Secondary | ICD-10-CM

## 2022-03-18 DIAGNOSIS — R293 Abnormal posture: Secondary | ICD-10-CM

## 2022-03-18 DIAGNOSIS — R262 Difficulty in walking, not elsewhere classified: Secondary | ICD-10-CM | POA: Diagnosis not present

## 2022-03-18 DIAGNOSIS — Z170 Estrogen receptor positive status [ER+]: Secondary | ICD-10-CM | POA: Diagnosis not present

## 2022-03-18 NOTE — Therapy (Signed)
?OUTPATIENT PHYSICAL THERAPY TREATMENT NOTE ? ? ?Patient Name: Kayla Bennett ?MRN: [id number] ?DOB:1951-05-18, 71 y.o., female ?Today's Date: 03/18/2022 ? ?PCP: [name], MD ?REFERRING PROVIDER: [name], MD ? ? PT End of Session - 03/18/22 1508   ? ? Visit Number 29   add kx  ? Number of Visits 30   ? Date for PT Re-Evaluation 03/25/22   ? PT Start Time 1505   ? PT Stop Time 1558   ? PT Time Calculation (min) 53 min   ? Activity Tolerance Patient tolerated treatment well   ? Behavior During Therapy [facility] for tasks assessed/performed   ? ?  ?  ? ?  ? ? ? ?Past Medical History:  ?Diagnosis Date  ? Anemia   ? Arthritis   ? Asthma   ? Breast cancer ([place])   ? Diabetes ([place])   ? Dyspnea   ? Dysrhythmia   ? afibb on occasion  ? Family history of adverse reaction to anesthesia   ? mother had reaction to versed...during surgery, stopped breathing  ? Fibromyalgia   ? GERD (gastroesophageal reflux disease)   ? Headache   ? History of hiatal hernia   ? Hypertension   ? Migraines   ? Osteopenia   ? Psoriasis   ? Yeast infection 10/30/2021  ? ?Past Surgical History:  ?Procedure Laterality Date  ? ABDOMINAL HYSTERECTOMY    ? APPENDECTOMY    ? ATRIAL FIBRILLATION ABLATION N/A 12/10/2021  ? Procedure: ATRIAL FIBRILLATION ABLATION;  Surgeon: Constance Haw, MD;  Location: [place] [facility];  Service: Cardiovascular;  Laterality: N/A;  ? BREAST LUMPECTOMY WITH RADIOACTIVE SEED AND SENTINEL LYMPH NODE BIOPSY Right 02/18/2021  ? Procedure: RIGHT BREAST LUMPECTOMY WITH RADIOACTIVE SEED AND SENTINEL LYMPH NODE BIOPSY;  Surgeon: [name], MD;  Location: [place];  Service: General;  Laterality: Right;  ? broken wrist    ? CARDIOVERSION N/A 04/04/2021  ? Procedure: CARDIOVERSION;  Surgeon: Sueanne Margarita, MD;  Location: [place];  Service: Cardiovascular;  Laterality: N/A;  ? CARDIOVERSION N/A 08/14/2021  ? Procedure: CARDIOVERSION;  Surgeon: Geralynn Rile, MD;  Location: [place];  Service:  Cardiovascular;  Laterality: N/A;  ? CARDIOVERSION N/A 09/19/2021  ? Procedure: CARDIOVERSION;  Surgeon: Geralynn Rile, MD;  Location: [place];  Service: Cardiovascular;  Laterality: N/A;  ? [place]  ? CHOLECYSTECTOMY    ? FRACTURE SURGERY    ? right wrist  ? HYSTERECTOMY ABDOMINAL WITH SALPINGO-OOPHORECTOMY    ? LYSIS OF ADHESION    ? SHOULDER SURGERY Left   ? TUBAL LIGATION    ? ?Patient Active Problem List  ? Diagnosis Date Noted  ? Yeast infection 10/30/2021  ? Breast abscess   ? Sepsis ([place]) 10/12/2021  ? Fever 10/12/2021  ? Nausea 10/12/2021  ? Headache 10/12/2021  ? Generalized weakness 10/12/2021  ? Diabetes ([place])   ? Hypertension   ? GERD (gastroesophageal reflux disease)   ? Iron deficiency anemia 09/25/2021  ? Secondary hypercoagulable state ([place]) 07/26/2021  ? Persistent atrial fibrillation ([place])   ? Genetic testing 12/26/2020  ? Malignant neoplasm of upper-outer quadrant of right breast in female, estrogen receptor positive ([place]) 11/28/2020  ? ? ?REFERRING DIAG: Rt breast cancer, muscle weakness  ? ?THERAPY DIAG:  ?Muscle weakness (generalized) ? ?Difficulty in walking, not elsewhere classified ? ?Stiffness of right shoulder, not elsewhere classified ? ?Abnormal posture ? ?Malignant neoplasm of upper-outer quadrant of right breast in female,  estrogen receptor positive ([place]) ? ?PERTINENT HISTORY: Patient was diagnosed on 11/22/2020 with right invasive ductal carcinoma breast cancer. She underwent a right lumpectomy and sentinel node biopsy (5 negative nodes) on 02/18/2021. It is ER/PR positive and HER2 negative with a Ki67 of 10%. She has many orthopedic problems from a traumatic fall and other accidents, pt has had 3 cardioversions this year for atrial fib on 04/04/21, 08/14/21 and 09/19/21, has hx of rotator cuff surgery on the R  ? ?PRECAUTIONS: A-fib, recent cardioablation, lymphedema Risk Rt UE ? ?SUBJECTIVE: I had a bad weekend. I think it was fibromyalgia. I started on  my bipap machine.  ? ?PAIN:  ?Are you having pain? No ? ?TODAY'S TREATMENT  ? ?03/18/22: ?Therapeutic Ex: ?Nustep LE only level 5x 10 min 7 sec, seat at 7; Steps: 540 ?Neuromuscular Re-education ?In parallel bars:  ?High marches on cob[place] x 10 with // bars for support occasionally ?Standing on airex x 2 min with increased sway but no LOB with no HHA ?Forward step ups  on air ex x 10 reps each with pt having mild LOBs but able to self correct with bars,then lateral step ups on air ex only x10 each side with +1 HHA to fingertip support ?Heel/toe walking x 6 length of // bars with occasional HHA and some LOB that pt was able to self correct ?Braiding x 4 length of // bars with fingertip support and pt with improved foot placement today. ?Therapeutic Ex: ?Seated alt marching 4# on each ankle 2x10 ?Alt LAQs 4# on each ankle ?FreeMotion machine for 3 way hip raises: 3# in to flexion, abduction (increased difficulty with this) and extension x 10 reps in all directions ? ?03/10/22: ?Therapeutic Ex: ?Nustep LE only level 5x 10 min 9 sec, seat at 7; Steps: 676 ?Neuromuscular Re-education ?In parallel bars:  ?High marches on cob[place] x 10 with // bars for support ?Standing on [place] x 2 min with increased sway but no LOB with no HHA ?Forward step ups  air ex on 2" step x 10 reps each with pt having mild LOBs but able to self correct with bars, encouraged fingertip support to limit this, seated restthen lateral step ups on air ex only x10 each side with +1 HHA to fingertip support ?Heel/toe walking x 6 length of // bars with no HHA and some LOB that pt was able to self correct ?Braiding x 4 length of // bars with fingertip support and pt with improved foot placement today. ?Therapeutic Ex: ?Seated alt marching 3# on each ankle 2x10 ?Alt LAQs 3# on each ankle ?Tried FreeMotion machine for 3 way hip raises: Pt got thru Rt LE 3# ext and flex x5 each, but then pt started feeling lightheaded so opted to end treatment at  this time. Her SpO2 was 94% ? ?03/06/22: ?Nustep LE only level 5x 10 min 11 sec, seat at 7; Steps: 640, slower pace due to knee discomfort ?Seated: Lt LAQ with 3 lb weights 3 sec holds x 10 reps each, unable to do with Rt today due to pain, alt marching x 10 reps each with 3 lb weights on Lt ?Hip add purple ball squeeze x20, 3 sec holds ?In Supine: Bridging 2x10 with VCs for controlled eccentric contraction  ?SLR Rt then Lt x10 reps, no pain in Rt LE with SLR but painful transitioning into and out of knee flex/ext;  ?Posterior pelvic tilt x12, then with holding tilt: open/close knees x10  ? ?Neuromuscular Re-education ?  In parallel bars:  ?High marches on cobble stone x 10 with // bars for support, challenging for pt and she kept having to stop to step back as she was travelling forward ?Standing on [place] x 2 min with increased sway but no LOB with no HHA ?Forward step ups and lateral step ups on air ex x 10 reps each with pt having several losses of balance due to not taking large enough steps ?Heel/toe walking x 4 length of // bars with no HHA and some LOB that pt was able to correct ?Braiding x 4 length of // bars with no HHA and v/c for intentional steps to prevent LOB ?Standing in // and reaching out for cones to work on weight shifting and maintaining balance when reaching out of BOS x 10 ? ?02/27/22 ?Therapeutic Exercise: ?Nustep LE only level 5x 10 min 22 sec, seat at 7; Steps: 660 ?Seated: LAQ with 3 lb weights 3 sec holds x 10 reps each, marching x 10 reps each with 3 lb weights ?3 way hip with 3 lbs on [place] machine with pt feeling fatigued after this and requiring seated recovery period x 10 reps in direction of flexion, abduction and extension with v/c to keep knee straight ? ?Neuromuscular Re-education ?In parallel bars:  ?High marches on cobble stone x 10 with // bars for support ?Standing on cobble [place] x 2 min with increased sway but no LOB with no HHA ?Forward step ups and lateral step  ups on air ex x 10 reps each with pt having several losses of balance due to not taking large enough steps ?Heel/toe walking x 4 length of // bars with no HHA and some LOB that pt was able to correct ?Braiding x 4

## 2022-03-20 ENCOUNTER — Encounter: Payer: Self-pay | Admitting: Cardiology

## 2022-03-20 ENCOUNTER — Ambulatory Visit (INDEPENDENT_AMBULATORY_CARE_PROVIDER_SITE_OTHER): Payer: Medicare Other | Admitting: Cardiology

## 2022-03-20 VITALS — BP 130/76 | HR 85 | Ht 64.0 in | Wt 197.4 lb

## 2022-03-20 DIAGNOSIS — D6869 Other thrombophilia: Secondary | ICD-10-CM | POA: Diagnosis not present

## 2022-03-20 DIAGNOSIS — I4819 Other persistent atrial fibrillation: Secondary | ICD-10-CM | POA: Diagnosis not present

## 2022-03-20 NOTE — Progress Notes (Signed)
? ?Electrophysiology Office Note ? ? ?Date:  03/20/2022  ? ?ID:  Kayla Bennett, DOB [birth year], MRN [id number] ? ?PCP:  [name], MD  ?Cardiologist:  Johney Frame ?Primary Electrophysiologist:  Kennedy Brines Meredith Leeds, MD   ? ?Chief Complaint: AF ?  ?History of Present Illness: ?Kayla Bennett is a 71 y.o. female who is being seen today for the evaluation of AF at the request of Swaney, Idelle Leech, MD. Presenting today for electrophysiology evaluation. ? ?She has a history significant for hypertension, diabetes, breast cancer, fibromyalgia, atrial fibrillation, vitamin B12 and iron deficiency anemia.  She was diagnosed with atrial fibrillation May 2022 and a preoperative ECG.  She was started on Eliquis.  She had a cardioversion 04/04/2021 but went back into atrial fibrillation 07/10/2021.  She was started on flecainide and metoprolol but unfortunately went back into atrial fibrillation.  She is now status post atrial fibrillation ablation on 12/10/2021. ? ?Today, denies symptoms of palpitations, chest pain, shortness of breath, orthopnea, PND, lower extremity edema, claudication, dizziness, presyncope, syncope, bleeding, or neurologic sequela. The patient is tolerating medications without difficulties.  Over the last week she has had some palpitations.  She states that she feels her pulse and it is always regular.  Aside from that, she is unaware of further episodes of atrial fibrillation.  She is working to get comfortable with her BiPAP mask. ? ? ?Past Medical History:  ?Diagnosis Date  ? Anemia   ? Arthritis   ? Asthma   ? Breast cancer ([place])   ? Diabetes ([place])   ? Dyspnea   ? Dysrhythmia   ? afibb on occasion  ? Family history of adverse reaction to anesthesia   ? mother had reaction to versed...during surgery, stopped breathing  ? Fibromyalgia   ? GERD (gastroesophageal reflux disease)   ? Headache   ? History of hiatal hernia   ? Hypertension   ? Migraines   ? Osteopenia   ? Psoriasis   ? Yeast  infection 10/30/2021  ? ?Past Surgical History:  ?Procedure Laterality Date  ? ABDOMINAL HYSTERECTOMY    ? APPENDECTOMY    ? ATRIAL FIBRILLATION ABLATION N/A 12/10/2021  ? Procedure: ATRIAL FIBRILLATION ABLATION;  Surgeon: Constance Haw, MD;  Location: [facility] [facility];  Service: Cardiovascular;  Laterality: N/A;  ? BREAST LUMPECTOMY WITH RADIOACTIVE SEED AND SENTINEL LYMPH NODE BIOPSY Right 02/18/2021  ? Procedure: RIGHT BREAST LUMPECTOMY WITH RADIOACTIVE SEED AND SENTINEL LYMPH NODE BIOPSY;  Surgeon: [name], MD;  Location: [place];  Service: General;  Laterality: Right;  ? broken wrist    ? CARDIOVERSION N/A 04/04/2021  ? Procedure: CARDIOVERSION;  Surgeon: Sueanne Margarita, MD;  Location: [place];  Service: Cardiovascular;  Laterality: N/A;  ? CARDIOVERSION N/A 08/14/2021  ? Procedure: CARDIOVERSION;  Surgeon: Geralynn Rile, MD;  Location: [place];  Service: Cardiovascular;  Laterality: N/A;  ? CARDIOVERSION N/A 09/19/2021  ? Procedure: CARDIOVERSION;  Surgeon: Geralynn Rile, MD;  Location: [place];  Service: Cardiovascular;  Laterality: N/A;  ? [place]  ? CHOLECYSTECTOMY    ? FRACTURE SURGERY    ? right wrist  ? HYSTERECTOMY ABDOMINAL WITH SALPINGO-OOPHORECTOMY    ? LYSIS OF ADHESION    ? SHOULDER SURGERY Left   ? TUBAL LIGATION    ? ? ? ?Current Outpatient Medications  ?Medication Sig Dispense Refill  ? acetaminophen (TYLENOL) 500 MG tablet Take 500 mg by mouth every 6 (six) hours as needed  for moderate pain, headache or mild pain.    ? albuterol (VENTOLIN HFA) 108 (90 Base) MCG/ACT inhaler Inhale 1-2 puffs into the lungs every 6 (six) hours as needed for shortness of breath or wheezing (Asthma).    ? apixaban (ELIQUIS) 5 MG TABS tablet Take 1 tablet (5 mg total) by mouth 2 (two) times daily. Pt needs 90 day supply.  Please keep on file till next refill. 180 tablet 3  ? calcipotriene (DOVONOX) 0.005 % cream Apply 1 application topically See admin  instructions. Mix with fluorouracil 5% cream and apply topically twice daily for five days - as needed for precancerous on right cheek.    ? cetirizine (ZYRTEC) 10 MG tablet Take 10 mg by mouth in the morning.    ? cholecalciferol (VITAMIN D3) 25 MCG (1000 UNIT) tablet Take 1,000-2,000 Units by mouth See admin instructions. Take 1000 units in the morning and 2000 units at bedtime    ? ferrous sulfate 325 (65 FE) MG tablet Take 325 mg by mouth every morning.    ? fluconazole (DIFLUCAN) 150 MG tablet Take 1 tablet by mouth every 72 hours as needed. 5 tablet 0  ? fluorouracil (EFUDEX) 5 % cream Apply 1 application topically 2 (two) times daily as needed (precancerous spots on right cheek). Mix with calcipotriene 0.005% cream and apply topically twice daily for five days - as needed for precancerous on right cheek.    ? gabapentin (NEURONTIN) 100 MG capsule Take 200 mg by mouth at bedtime.    ? losartan-hydrochlorothiazide (HYZAAR) 100-25 MG tablet Take 1 tablet by mouth daily. 30 tablet 3  ? metFORMIN (GLUCOPHAGE-XR) 500 MG 24 hr tablet Take 500 mg by mouth at bedtime.    ? metoprolol succinate (TOPROL XL) 25 MG 24 hr tablet Take 1 tablet (25 mg total) by mouth daily.    ? pantoprazole (PROTONIX) 40 MG tablet Take 40 mg by mouth 2 (two) times daily before a meal.    ? potassium chloride SA (KLOR-CON M20) 20 MEQ tablet Take 1 tablet (20 mEq total) by mouth daily. 30 tablet 6  ? Prenatal Vit-Fe Fumarate-FA (PRENATAL VITAMIN PLUS LOW IRON PO) Take 1 tablet by mouth in the morning.    ? SUMAtriptan (IMITREX) 100 MG tablet Take 100 mg by mouth See admin instructions. Take one tablet (100 mg) by mouth at onset of migraine headache, may repeat in 2 hours if headache persists or recurs.    ? tamoxifen (NOLVADEX) 20 MG tablet TAKE 1 TABLET(20 MG) BY MOUTH DAILY (Patient taking differently: Take 20 mg by mouth every morning.) 30 tablet 3  ? triamcinolone (KENALOG) 0.1 % Apply 1 application topically 2 (two) times daily as needed  (psoriasis). Mixed with Cetaphil 1:1    ? venlafaxine XR (EFFEXOR-XR) 37.5 MG 24 hr capsule Take 37.5 mg by mouth at bedtime. Take 1 capsule (75 mg) + 1 capsule (37.5 mg) to equal 112.5 mg at bedtime    ? venlafaxine XR (EFFEXOR-XR) 75 MG 24 hr capsule Take 75 mg by mouth at bedtime. Take 1 capsule (75 mg) + 1 capsule (37.5 mg) to equal 112.5 mg  at bedtime    ? vitamin B-12 (CYANOCOBALAMIN) 1000 MCG tablet Take 1,000 mcg by mouth every morning.    ? vitamin C (ASCORBIC ACID) 500 MG tablet Take 500 mg by mouth daily.    ? ?No current facility-administered medications for this visit.  ? ? ?Allergies:   Lotensin [benazepril hcl], Morphine, Erythromycin base, Hydrocodone, Other, Banana,  Chocolate, Gluten meal, Tizanidine, and Wound dressing adhesive  ? ?Social History:  The patient  reports that she has never smoked. She has never used smokeless tobacco. She reports that she does not drink alcohol and does not use drugs.  ? ?Family History:  The patient's family history includes Atrial fibrillation in her cousin and mother; Cancer in her maternal uncle; Colon cancer in her cousin and paternal uncle; Colon cancer (age of onset: 34) in her father; Heart attack (age of onset: 27) in her father; Heart attack (age of onset: 22) in her paternal grandmother; Heart attack (age of onset: 32) in her mother.  ? ?ROS:  Please see the history of present illness.   Otherwise, review of systems is positive for none.   All other systems are reviewed and negative.  ? ?PHYSICAL EXAM: ?VS:  BP 130/76   Pulse 85   Ht '5\' 4"'$  (1.626 m)   Wt 197 lb 6.4 oz (89.5 kg)   SpO2 95%   BMI 33.88 kg/m?  , BMI Body mass index is 33.88 kg/m?. ?GEN: Well nourished, well developed, in no acute distress  ?HEENT: normal  ?Neck: no JVD, carotid bruits, or masses ?Cardiac: RRR; no murmurs, rubs, or gallops,no edema  ?Respiratory:  clear to auscultation bilaterally, normal work of breathing ?GI: soft, nontender, nondistended, + BS ?MS: no deformity or  atrophy  ?Skin: warm and dry ?Neuro:  Strength and sensation are intact ?Psych: euthymic mood, full affect ? ?EKG:  EKG is ordered today. ?Personal review of the ekg ordered shows sinus rhythm ? ?Recent Labs: ?10/13/19

## 2022-03-20 NOTE — Patient Instructions (Signed)
Medication Instructions:  ?Your physician recommends that you continue on your current medications as directed. Please refer to the Current Medication list given to you today. ? ?*If you need a refill on your cardiac medications before your next appointment, please call your pharmacy* ? ? ?Lab Work: ?None ordered ? ? ?Testing/Procedures: ?None ordered ? ? ?Follow-Up: ?At [facility], you and your health needs are our priority.  As part of our continuing mission to provide you with exceptional heart care, we have created designated Provider Care Teams.  These Care Teams include your primary Cardiologist (physician) and Advanced Practice Providers (APPs -  Physician Assistants and Nurse Practitioners) who all work together to provide you with the care you need, when you need it. ? ?Your next appointment:   ?6 month(s) ? ?The format for your next appointment:   ?In Person ? ?Provider:   ?You will see one of the following Advanced Practice Providers on your designated Care Team:   ?Tommye Standard, PA-C ?Legrand Como "Jonni Sanger" Blomkest, PA-C ? ?If primary card or EP is not listed click here to update    :1}  ? ? ?Thank you for choosing [facility]!! ? ? ?Trinidad Curet, RN ?[phone] ? ?Other Instructions ? ?Important Information About Sugar ? ? ? ? ? ? ? ? ?  ?

## 2022-03-25 NOTE — Telephone Encounter (Signed)
Order placed to [employer] to Please add 2 L O2 to PAP therapy nightly and repeat overnight pulse ox on PAP therapy and O2. ?

## 2022-03-25 NOTE — Progress Notes (Signed)
Order placed to [employer] [facility] to Please add 2 L O2 to PAP therapy nightly and repeat overnight pulse ox on PAP therapy and O2. ?

## 2022-03-26 ENCOUNTER — Encounter: Payer: Self-pay | Admitting: Physical Therapy

## 2022-03-26 ENCOUNTER — Ambulatory Visit: Payer: Medicare Other | Admitting: Physical Therapy

## 2022-03-26 DIAGNOSIS — C50411 Malignant neoplasm of upper-outer quadrant of right female breast: Secondary | ICD-10-CM | POA: Diagnosis not present

## 2022-03-26 DIAGNOSIS — M6281 Muscle weakness (generalized): Secondary | ICD-10-CM | POA: Diagnosis not present

## 2022-03-26 DIAGNOSIS — R293 Abnormal posture: Secondary | ICD-10-CM

## 2022-03-26 DIAGNOSIS — R262 Difficulty in walking, not elsewhere classified: Secondary | ICD-10-CM | POA: Diagnosis not present

## 2022-03-26 DIAGNOSIS — Z170 Estrogen receptor positive status [ER+]: Secondary | ICD-10-CM | POA: Diagnosis not present

## 2022-03-26 DIAGNOSIS — M25611 Stiffness of right shoulder, not elsewhere classified: Secondary | ICD-10-CM | POA: Diagnosis not present

## 2022-03-26 NOTE — Therapy (Addendum)
?OUTPATIENT PHYSICAL THERAPY TREATMENT NOTE ? ? ?Patient Name: Kayla Bennett ?MRN: [id number] ?DOB:November 07, 1951, 71 y.o., female ?Today's Date: 03/26/2022 ? ?PCP: [name], MD ?REFERRING PROVIDER: [name], MD ? ? PT End of Session - 03/26/22 1315   ? ? Visit Number 30   ? Number of Visits 30   ? PT Start Time 0355   pt arrived late  ? PT Stop Time 1348   ? PT Time Calculation (min) 33 min   ? Activity Tolerance Patient tolerated treatment well   ? Behavior During Therapy [facility] for tasks assessed/performed   ? ?  ?  ? ?  ? ? ? ?Past Medical History:  ?Diagnosis Date  ? Anemia   ? Arthritis   ? Asthma   ? Breast cancer ([place])   ? Diabetes ([place])   ? Dyspnea   ? Dysrhythmia   ? afibb on occasion  ? Family history of adverse reaction to anesthesia   ? mother had reaction to versed...during surgery, stopped breathing  ? Fibromyalgia   ? GERD (gastroesophageal reflux disease)   ? Headache   ? History of hiatal hernia   ? Hypertension   ? Migraines   ? Osteopenia   ? Psoriasis   ? Yeast infection 10/30/2021  ? ?Past Surgical History:  ?Procedure Laterality Date  ? ABDOMINAL HYSTERECTOMY    ? APPENDECTOMY    ? ATRIAL FIBRILLATION ABLATION N/A 12/10/2021  ? Procedure: ATRIAL FIBRILLATION ABLATION;  Surgeon: Constance Haw, MD;  Location: [facility];  Service: Cardiovascular;  Laterality: N/A;  ? BREAST LUMPECTOMY WITH RADIOACTIVE SEED AND SENTINEL LYMPH NODE BIOPSY Right 02/18/2021  ? Procedure: RIGHT BREAST LUMPECTOMY WITH RADIOACTIVE SEED AND SENTINEL LYMPH NODE BIOPSY;  Surgeon: [name], MD;  Location: [place];  Service: General;  Laterality: Right;  ? broken wrist    ? CARDIOVERSION N/A 04/04/2021  ? Procedure: CARDIOVERSION;  Surgeon: Sueanne Margarita, MD;  Location: [place];  Service: Cardiovascular;  Laterality: N/A;  ? CARDIOVERSION N/A 08/14/2021  ? Procedure: CARDIOVERSION;  Surgeon: Geralynn Rile, MD;  Location: [place];  Service: Cardiovascular;  Laterality: N/A;  ?  CARDIOVERSION N/A 09/19/2021  ? Procedure: CARDIOVERSION;  Surgeon: Geralynn Rile, MD;  Location: [place];  Service: Cardiovascular;  Laterality: N/A;  ? [place]  ? CHOLECYSTECTOMY    ? FRACTURE SURGERY    ? right wrist  ? HYSTERECTOMY ABDOMINAL WITH SALPINGO-OOPHORECTOMY    ? LYSIS OF ADHESION    ? SHOULDER SURGERY Left   ? TUBAL LIGATION    ? ?Patient Active Problem List  ? Diagnosis Date Noted  ? Yeast infection 10/30/2021  ? Breast abscess   ? Sepsis ([place]) 10/12/2021  ? Fever 10/12/2021  ? Nausea 10/12/2021  ? Headache 10/12/2021  ? Generalized weakness 10/12/2021  ? Diabetes ([place])   ? Hypertension   ? GERD (gastroesophageal reflux disease)   ? Iron deficiency anemia 09/25/2021  ? Secondary hypercoagulable state ([place]) 07/26/2021  ? Persistent atrial fibrillation ([place])   ? Genetic testing 12/26/2020  ? Malignant neoplasm of upper-outer quadrant of right breast in female, estrogen receptor positive ([place]) 11/28/2020  ? ? ?REFERRING DIAG: Rt breast cancer, muscle weakness  ? ?THERAPY DIAG:  ?Muscle weakness (generalized) ? ?Difficulty in walking, not elsewhere classified ? ?Stiffness of right shoulder, not elsewhere classified ? ?Abnormal posture ? ?Malignant neoplasm of upper-outer quadrant of right breast in female, estrogen receptor positive ([place]) ? ?PERTINENT HISTORY:  Patient was diagnosed on 11/22/2020 with right invasive ductal carcinoma breast cancer. She underwent a right lumpectomy and sentinel node biopsy (5 negative nodes) on 02/18/2021. It is ER/PR positive and HER2 negative with a Ki67 of 10%. She has many orthopedic problems from a traumatic fall and other accidents, pt has had 3 cardioversions this year for atrial fib on 04/04/21, 08/14/21 and 09/19/21, has hx of rotator cuff surgery on the R  ? ?PRECAUTIONS: A-fib, recent cardioablation, lymphedema Risk Rt UE ? ?SUBJECTIVE: Wilburn Mylar was a rough day.  ? ?PAIN:  ?Are you having pain? No ? ?TODAY'S TREATMENT   ? ?03/26/22: ?Therapeutic Ex: ?Nustep LE only level 5x 10 min 4 sec seat at 7; Steps: 174 ?Neuromuscular Re-education ?In parallel bars:  ?High marches on cob[place] stone x 10 with // bars for support throughout ?Heel/toe walking on airex x 6 length of // bars with occasional HHA and some LOB that pt was able to self correct ?Therapeutic Ex: ?Seated alt marching 4# on each ankle 1x10 ?Alt LAQs 4# on each ankle x 10 ? ? ?03/18/22: ?Therapeutic Ex: ?Nustep LE only level 5x 10 min 7 sec, seat at 7; Steps: 944 ?Neuromuscular Re-education ?In parallel bars:  ?High marches on cobble stone x 10 with // bars for support occasionally ?Standing on airex x 2 min with increased sway but no LOB with no HHA ?Forward step ups  on air ex x 10 reps each with pt having mild LOBs but able to self correct with bars,then lateral step ups on air ex only x10 each side with +1 HHA to fingertip support ?Heel/toe walking x 6 length of // bars with occasional HHA and some LOB that pt was able to self correct ?Braiding x 4 length of // bars with fingertip support and pt with improved foot placement today. ?Therapeutic Ex: ?Seated alt marching 4# on each ankle 2x10 ?Alt LAQs 4# on each ankle ?FreeMotion machine for 3 way hip raises: 3# in to flexion, abduction (increased difficulty with this) and extension x 10 reps in all directions ? ?03/10/22: ?Therapeutic Ex: ?Nustep LE only level 5x 10 min 9 sec, seat at 7; Steps: 676 ?Neuromuscular Re-education ?In parallel bars:  ?High marches on cobble stone x 10 with // bars for support ?Standing on cob[place] x 2 min with increased sway but no LOB with no HHA ?Forward step ups  air ex on 2" step x 10 reps each with pt having mild LOBs but able to self correct with bars, encouraged fingertip support to limit this, seated restthen lateral step ups on air ex only x10 each side with +1 HHA to fingertip support ?Heel/toe walking x 6 length of // bars with no HHA and some LOB that pt was able to self  correct ?Braiding x 4 length of // bars with fingertip support and pt with improved foot placement today. ?Therapeutic Ex: ?Seated alt marching 3# on each ankle 2x10 ?Alt LAQs 3# on each ankle ?Tried FreeMotion machine for 3 way hip raises: Pt got thru Rt LE 3# ext and flex x5 each, but then pt started feeling lightheaded so opted to end treatment at this time. Her SpO2 was 94% ? ?03/06/22: ?Nustep LE only level 5x 10 min 11 sec, seat at 7; Steps: 640, slower pace due to knee discomfort ?Seated: Lt LAQ with 3 lb weights 3 sec holds x 10 reps each, unable to do with Rt today due to pain, alt marching x 10 reps each with 3 lb  weights on Lt ?Hip add purple ball squeeze x20, 3 sec holds ?In Supine: Bridging 2x10 with VCs for controlled eccentric contraction  ?SLR Rt then Lt x10 reps, no pain in Rt LE with SLR but painful transitioning into and out of knee flex/ext;  ?Posterior pelvic tilt x12, then with holding tilt: open/close knees x10  ? ?Neuromuscular Re-education ?In parallel bars:  ?High marches on cobble stone x 10 with // bars for support, challenging for pt and she kept having to stop to step back as she was travelling forward ?Standing on [place] x 2 min with increased sway but no LOB with no HHA ?Forward step ups and lateral step ups on air ex x 10 reps each with pt having several losses of balance due to not taking large enough steps ?Heel/toe walking x 4 length of // bars with no HHA and some LOB that pt was able to correct ?Braiding x 4 length of // bars with no HHA and v/c for intentional steps to prevent LOB ?Standing in // and reaching out for cones to work on weight shifting and maintaining balance when reaching out of BOS x 10 ? ?02/27/22 ?Therapeutic Exercise: ?Nustep LE only level 5x 10 min 22 sec, seat at 7; Steps: 660 ?Seated: LAQ with 3 lb weights 3 sec holds x 10 reps each, marching x 10 reps each with 3 lb weights ?3 way hip with 3 lbs on [place] machine with pt feeling fatigued after  this and requiring seated recovery period x 10 reps in direction of flexion, abduction and extension with v/c to keep knee straight ? ?Neuromuscular Re-education ?In parallel bars:  ?High marches on cobble stone x 10 with // bars for

## 2022-04-04 ENCOUNTER — Telehealth: Payer: Self-pay | Admitting: Cardiology

## 2022-04-04 DIAGNOSIS — G4733 Obstructive sleep apnea (adult) (pediatric): Secondary | ICD-10-CM

## 2022-04-04 NOTE — Telephone Encounter (Signed)
Patient needs a script for a breathe port adaptor.

## 2022-04-08 ENCOUNTER — Other Ambulatory Visit: Payer: Self-pay

## 2022-04-08 DIAGNOSIS — Z170 Estrogen receptor positive status [ER+]: Secondary | ICD-10-CM

## 2022-04-08 MED ORDER — TAMOXIFEN CITRATE 20 MG PO TABS: 20.0000 mg | ORAL_TABLET | Freq: Every morning | ORAL | 3 refills | Status: DC

## 2022-04-17 NOTE — Addendum Note (Signed)
Addended by: Freada Bergeron on: 04/17/2022 04:44 PM   Modules accepted: Orders

## 2022-04-17 NOTE — Telephone Encounter (Addendum)
Reached out to [facility] to get clarity on what the Rx needs to be. Please write a Rx for an oxygen bleeding port used to connect the cpap and the oxygen so they can be used at the same time.   Order placed to [facility] via community message.

## 2022-04-18 ENCOUNTER — Ambulatory Visit (INDEPENDENT_AMBULATORY_CARE_PROVIDER_SITE_OTHER): Payer: Medicare Other | Admitting: Cardiology

## 2022-04-18 ENCOUNTER — Encounter: Payer: Self-pay | Admitting: Cardiology

## 2022-04-18 VITALS — BP 128/70 | HR 84 | Ht 64.0 in | Wt 198.2 lb

## 2022-04-18 DIAGNOSIS — G4733 Obstructive sleep apnea (adult) (pediatric): Secondary | ICD-10-CM

## 2022-04-18 DIAGNOSIS — I10 Essential (primary) hypertension: Secondary | ICD-10-CM | POA: Diagnosis not present

## 2022-04-18 NOTE — Progress Notes (Signed)
Sleep Medicine CONSULT Note    Date:  04/18/2022   ID:  Kayla Bennett, DOB 06/25/1951, MRN [id number]  PCP:  [name], MD  Cardiologist:  Gwyndolyn Kaufman, MD  Chief Complaint  Patient presents with   New Patient (Initial Visit)    Obstructive sleep apnea    History of Present Illness:  Kayla Bennett is a 71 y.o. female who is being seen today for the evaluation of OSA at the request of Louis Meckel, MD.  This is a 71 year old female with a history of diabetes, atrial fibrillation, GERD, hypertension who was referred by Dr. Curt Bears for evaluation for possible obstructive sleep apnea as a cause of atrial fibrillation.  She underwent home sleep study on 11/18/2021 which showed severe obstructive sleep apnea with an AHI of 55.8/h, moderate central sleep apnea with AHIc of 27.5/h with 39% of the time Cheyne-Stokes respirations.  She had nocturnal hypoxemia with O2 sat nadir 71% and had persistent O2 desaturations less than 88% for over 1 hour.    She underwent CPAP titration on 01/29/2022 but due to ongoing respiratory events could not be adequately titrated and was started on BiPAP titration and an auto BiPAP with IPAP max 20 cm H2O, EPAP min 5 cm H2O and pressure support 4 cm H2O was ordered.  She is now here for evaluation.  She was found to have persistent nocturnal hypoxemia and O2 at 2L via BiPAP but she never got is set up because there was no way to hook it up to her BIPAP  She is doing well with his BiPAP device and thinks that he has gotten used to it.  She tolerates the full face mask and feels the pressure is adequate.  Since going on BiPAP she has not really noticed a significant improvement in her daytime sleepiness.  She denies any significant mouth or nasal dryness or nasal congestion.  She does not think that he snores.     Past Medical History:  Diagnosis Date   Anemia    Arthritis    Asthma    Breast cancer ([place])    Diabetes ([place])    Dyspnea     Dysrhythmia    afibb on occasion   Family history of adverse reaction to anesthesia    mother had reaction to versed...during surgery, stopped breathing   Fibromyalgia    GERD (gastroesophageal reflux disease)    Headache    History of hiatal hernia    Hypertension    Migraines    Osteopenia    Psoriasis    Yeast infection 10/30/2021    Past Surgical History:  Procedure Laterality Date   ABDOMINAL HYSTERECTOMY     APPENDECTOMY     ATRIAL FIBRILLATION ABLATION N/A 12/10/2021   Procedure: ATRIAL FIBRILLATION ABLATION;  Surgeon: Constance Haw, MD;  Location: [facility];  Service: Cardiovascular;  Laterality: N/A;   BREAST LUMPECTOMY WITH RADIOACTIVE SEED AND SENTINEL LYMPH NODE BIOPSY Right 02/18/2021   Procedure: RIGHT BREAST LUMPECTOMY WITH RADIOACTIVE SEED AND SENTINEL LYMPH NODE BIOPSY;  Surgeon: [name], MD;  Location: [facility];  Service: General;  Laterality: Right;   broken wrist     CARDIOVERSION N/A 04/04/2021   Procedure: CARDIOVERSION;  Surgeon: Sueanne Margarita, MD;  Location: [facility];  Service: Cardiovascular;  Laterality: N/A;   CARDIOVERSION N/A 08/14/2021   Procedure: CARDIOVERSION;  Surgeon: Geralynn Rile, MD;  Location: [facility];  Service: Cardiovascular;  Laterality: N/A;   CARDIOVERSION N/A  09/19/2021   Procedure: CARDIOVERSION;  Surgeon: Geralynn Rile, MD;  Location: [place];  Service: Cardiovascular;  Laterality: N/A;   [place]     right wrist   HYSTERECTOMY ABDOMINAL WITH SALPINGO-OOPHORECTOMY     LYSIS OF ADHESION     SHOULDER SURGERY Left    TUBAL LIGATION      Current Medications: Current Meds  Medication Sig   acetaminophen (TYLENOL) 500 MG tablet Take 500 mg by mouth every 6 (six) hours as needed for moderate pain, headache or mild pain.   albuterol (VENTOLIN HFA) 108 (90 Base) MCG/ACT inhaler Inhale 1-2 puffs into the lungs every 6 (six) hours  as needed for shortness of breath or wheezing (Asthma).   apixaban (ELIQUIS) 5 MG TABS tablet Take 1 tablet (5 mg total) by mouth 2 (two) times daily. Pt needs 90 day supply.  Please keep on file till next refill.   calcipotriene (DOVONOX) 0.005 % cream Apply 1 application topically See admin instructions. Mix with fluorouracil 5% cream and apply topically twice daily for five days - as needed for precancerous on right cheek.   cetirizine (ZYRTEC) 10 MG tablet Take 10 mg by mouth in the morning.   cholecalciferol (VITAMIN D3) 25 MCG (1000 UNIT) tablet Take 1,000-2,000 Units by mouth See admin instructions. Take 1000 units in the morning and 2000 units at bedtime   ferrous sulfate 325 (65 FE) MG tablet Take 325 mg by mouth every morning.   fluconazole (DIFLUCAN) 150 MG tablet Take 1 tablet by mouth every 72 hours as needed.   fluorouracil (EFUDEX) 5 % cream Apply 1 application topically 2 (two) times daily as needed (precancerous spots on right cheek). Mix with calcipotriene 0.005% cream and apply topically twice daily for five days - as needed for precancerous on right cheek.   gabapentin (NEURONTIN) 100 MG capsule Take 200 mg by mouth at bedtime.   losartan-hydrochlorothiazide (HYZAAR) 100-25 MG tablet Take 1 tablet by mouth daily.   metFORMIN (GLUCOPHAGE-XR) 500 MG 24 hr tablet Take 500 mg by mouth at bedtime.   metoprolol succinate (TOPROL XL) 25 MG 24 hr tablet Take 1 tablet (25 mg total) by mouth daily.   nystatin cream (MYCOSTATIN) in the morning and at bedtime.   pantoprazole (PROTONIX) 40 MG tablet Take 40 mg by mouth 2 (two) times daily before a meal.   potassium chloride SA (KLOR-CON M20) 20 MEQ tablet Take 1 tablet (20 mEq total) by mouth daily.   Prenatal Vit-Fe Fumarate-FA (PRENATAL VITAMIN PLUS LOW IRON PO) Take 1 tablet by mouth in the morning.   SUMAtriptan (IMITREX) 100 MG tablet Take 100 mg by mouth See admin instructions. Take one tablet (100 mg) by mouth at onset of migraine  headache, may repeat in 2 hours if headache persists or recurs.   tamoxifen (NOLVADEX) 20 MG tablet Take 1 tablet (20 mg total) by mouth every morning.   triamcinolone (KENALOG) 0.1 % Apply 1 application topically 2 (two) times daily as needed (psoriasis). Mixed with Cetaphil 1:1   venlafaxine XR (EFFEXOR-XR) 37.5 MG 24 hr capsule Take 37.5 mg by mouth at bedtime. Take 1 capsule (75 mg) + 1 capsule (37.5 mg) to equal 112.5 mg at bedtime   venlafaxine XR (EFFEXOR-XR) 75 MG 24 hr capsule Take 75 mg by mouth at bedtime. Take 1 capsule (75 mg) + 1 capsule (37.5 mg) to equal 112.5 mg  at bedtime  vitamin B-12 (CYANOCOBALAMIN) 1000 MCG tablet Take 1,000 mcg by mouth every morning.   vitamin C (ASCORBIC ACID) 500 MG tablet Take 500 mg by mouth daily.    Allergies:   Lotensin [benazepril hcl], Morphine, Erythromycin base, Hydrocodone, Other, Banana, Chocolate, Gluten meal, Tizanidine, and Wound dressing adhesive   Social History   Socioeconomic History   Marital status: Married    Spouse name: Not on file   Number of children: 2   Years of education: Not on file   Highest education level: Not on file  Occupational History   Not on file  Tobacco Use   Smoking status: Never   Smokeless tobacco: Never  Vaping Use   Vaping Use: Never used  Substance and Sexual Activity   Alcohol use: Never   Drug use: Never   Sexual activity: Not on file  Other Topics Concern   Not on file  Social History Narrative   Not on file   Social Determinants of Health   Financial Resource Strain: Not on file  Food Insecurity: Not on file  Transportation Needs: Not on file  Physical Activity: Not on file  Stress: Not on file  Social Connections: Not on file     Family History:  The patient's family history includes Atrial fibrillation in her cousin and mother; Cancer in her maternal uncle; Colon cancer in her cousin and paternal uncle; Colon cancer (age of onset: 27) in her father; Heart attack (age of  onset: 64) in her father; Heart attack (age of onset: 15) in her paternal grandmother; Heart attack (age of onset: 58) in her mother.   ROS:   Please see the history of present illness.    ROS All other systems reviewed and are negative.      View : No data to display.             PHYSICAL EXAM:   VS:  BP 128/70   Pulse 84   Ht '5\' 4"'$  (1.626 m)   Wt 198 lb 3.2 oz (89.9 kg)   SpO2 96%   BMI 34.02 kg/m    GEN: Well nourished, well developed, in no acute distress  HEENT: normal  Neck: no JVD, carotid bruits, or masses Cardiac: RRR; no murmurs, rubs, or gallops,no edema.  Intact distal pulses bilaterally.  Respiratory:  clear to auscultation bilaterally, normal work of breathing GI: soft, nontender, nondistended, + BS MS: no deformity or atrophy  Skin: warm and dry, no rash Neuro:  Alert and Oriented x 3, Strength and sensation are intact Psych: euthymic mood, full affect  Wt Readings from Last 3 Encounters:  04/18/22 198 lb 3.2 oz (89.9 kg)  03/20/22 197 lb 6.4 oz (89.5 kg)  02/21/22 200 lb 7 oz (90.9 kg)      Studies/Labs Reviewed:   EKG:  EKG is not ordered today.    Recent Labs: 10/12/2021: B Natriuretic Peptide 277.5 10/13/2021: TSH 2.875 10/16/2021: Magnesium 1.9 02/21/2022: ALT 23; BUN 12; Creatinine 0.71; Hemoglobin 11.2; Platelet Count 151; Potassium 3.4; Sodium 140   Lipid Panel No results found for: CHOL, TRIG, HDL, CHOLHDL, VLDL, LDLCALC, LDLDIRECT   CHA2DS2-VASc Score = 4   This indicates a 4.8% annual risk of stroke. The patient's score is based upon: CHF History: 0 HTN History: 1 Diabetes History: 1 Stroke History: 0 Vascular Disease History: 0 Age Score: 1 Gender Score: 1   Additional studies/ records that were reviewed today include:   Home sleep study, CPAP/BiPAP titration and Pap compliance  download   ASSESSMENT:    1. OSA (obstructive sleep apnea)   2. Primary hypertension      PLAN:  In order of problems listed  above:  OSA - The patient is tolerating PAP therapy well without any problems. The PAP download performed by his DME was personally reviewed and interpreted by me today and showed an AHI of 11.6/hr on auti BiPAP cm H2O with 61% compliance in using more than 4 hours nightly.  The patient has been using and benefiting from PAP use and will continue to benefit from therapy.  -I will talk with DME to see what needs to be done in regards to bleeding her O2 into her BiPAP -repeat ONO on BiPAP and O2 -Encouraged her to be more compliant with her device -AHI too high but large mask leak>>I will set her up with DME for repeat mask fit and then get download in 4 weeks>>if AHI remains elevated then will need to send back to lab for auto BiPAP ST titration or consider ASV given complex sleep apnea  2.  Hypertension -BP is adequately controlled on exam today -Continue prescription drug management with losartan HCT 100-25 mg daily, Toprol-XL 25 mg daily with as needed refills  Time Spent: 25 minutes total time of encounter, including 15 minutes spent in face-to-face patient care on the date of this encounter. This time includes coordination of care and counseling regarding above mentioned problem list. Remainder of non-face-to-face time involved reviewing chart documents/testing relevant to the patient encounter and documentation in the medical record. I have independently reviewed documentation from referring provider  Medication Adjustments/Labs and Tests Ordered: Current medicines are reviewed at length with the patient today.  Concerns regarding medicines are outlined above.  Medication changes, Labs and Tests ordered today are listed in the Patient Instructions below.     Signed, Fransico Him, MD [facility], [facility] 04/18/2022 11:52 AM    [facility] [address], [place],   [address] Phone: [phone]; Fax: [phone]

## 2022-04-18 NOTE — Patient Instructions (Signed)
Medication Instructions:  Your physician recommends that you continue on your current medications as directed. Please refer to the Current Medication list given to you today.  *If you need a refill on your cardiac medications before your next appointment, please call your pharmacy*   Lab Work: NONE If you have labs (blood work) drawn today and your tests are completely normal, you will receive your results only by: [place] (if you have MyChart) OR A paper copy in the mail If you have any lab test that is abnormal or we need to change your treatment, we will call you to review the results.   Testing/Procedures: NONE   Follow-Up: At [facility], you and your health needs are our priority.  As part of our continuing mission to provide you with exceptional heart care, we have created designated Provider Care Teams.  These Care Teams include your primary Cardiologist (physician) and Advanced Practice Providers (APPs -  Physician Assistants and Nurse Practitioners) who all work together to provide you with the care you need, when you need it.  We recommend signing up for the patient portal called "MyChart".  Sign up information is provided on this After Visit Summary.  MyChart is used to connect with patients for Virtual Visits (Telemedicine).  Patients are able to view lab/test results, encounter notes, upcoming appointments, etc.  Non-urgent messages can be sent to your provider as well.   To learn more about what you can do with MyChart, go to [email or web address].    Your next appointment:   3 month(s)  The format for your next appointment:   In Person  Provider:   Fransico Him, MD   Important Information About Sugar

## 2022-04-18 NOTE — Telephone Encounter (Signed)
Per dr Radford Pax, Large mask leak - please get her an appt with her DME in person for mask fitting - her AHI is still elevated likely related to mask leak.  Please repeat download in 4 weeks.

## 2022-04-18 NOTE — Addendum Note (Signed)
Addended by: Freada Bergeron on: 04/18/2022 01:11 PM   Modules accepted: Orders

## 2022-04-22 ENCOUNTER — Encounter: Payer: Self-pay | Admitting: Cardiology

## 2022-04-22 ENCOUNTER — Other Ambulatory Visit: Payer: Self-pay | Admitting: Cardiology

## 2022-04-22 MED ORDER — METOPROLOL SUCCINATE ER 50 MG PO TB24: 50.0000 mg | ORAL_TABLET | Freq: Every day | ORAL | 2 refills | Status: DC

## 2022-04-22 NOTE — Telephone Encounter (Signed)
See additional mychart message open about med called in.

## 2022-05-07 ENCOUNTER — Encounter: Payer: Self-pay | Admitting: Cardiology

## 2022-05-12 ENCOUNTER — Ambulatory Visit: Payer: Medicare Other | Attending: General Surgery

## 2022-05-12 VITALS — Wt 198.4 lb

## 2022-05-12 DIAGNOSIS — Z483 Aftercare following surgery for neoplasm: Secondary | ICD-10-CM | POA: Insufficient documentation

## 2022-05-13 ENCOUNTER — Other Ambulatory Visit (HOSPITAL_COMMUNITY): Payer: Self-pay | Admitting: *Deleted

## 2022-05-13 MED ORDER — LOSARTAN POTASSIUM-HCTZ 100-25 MG PO TABS: 1.0000 | ORAL_TABLET | Freq: Every day | ORAL | 6 refills | Status: DC

## 2022-05-27 ENCOUNTER — Encounter (HOSPITAL_COMMUNITY): Payer: Self-pay

## 2022-06-05 DIAGNOSIS — E119 Type 2 diabetes mellitus without complications: Secondary | ICD-10-CM | POA: Diagnosis not present

## 2022-06-05 DIAGNOSIS — H524 Presbyopia: Secondary | ICD-10-CM | POA: Diagnosis not present

## 2022-06-05 DIAGNOSIS — Z7984 Long term (current) use of oral hypoglycemic drugs: Secondary | ICD-10-CM | POA: Diagnosis not present

## 2022-06-05 DIAGNOSIS — H25813 Combined forms of age-related cataract, bilateral: Secondary | ICD-10-CM | POA: Diagnosis not present

## 2022-06-05 LAB — HM DIABETES EYE EXAM

## 2022-06-13 ENCOUNTER — Encounter: Payer: Self-pay | Admitting: Unknown Physician Specialty

## 2022-06-25 ENCOUNTER — Encounter: Payer: Self-pay | Admitting: Family Medicine

## 2022-06-25 ENCOUNTER — Ambulatory Visit (INDEPENDENT_AMBULATORY_CARE_PROVIDER_SITE_OTHER): Payer: Medicare Other | Admitting: Family Medicine

## 2022-06-25 VITALS — BP 119/75 | HR 72 | Ht 61.5 in | Wt 198.5 lb

## 2022-06-25 DIAGNOSIS — Z170 Estrogen receptor positive status [ER+]: Secondary | ICD-10-CM | POA: Diagnosis not present

## 2022-06-25 DIAGNOSIS — I10 Essential (primary) hypertension: Secondary | ICD-10-CM

## 2022-06-25 DIAGNOSIS — I4819 Other persistent atrial fibrillation: Secondary | ICD-10-CM | POA: Diagnosis not present

## 2022-06-25 DIAGNOSIS — E119 Type 2 diabetes mellitus without complications: Secondary | ICD-10-CM | POA: Diagnosis not present

## 2022-06-25 DIAGNOSIS — C50411 Malignant neoplasm of upper-outer quadrant of right female breast: Secondary | ICD-10-CM

## 2022-06-25 DIAGNOSIS — R2689 Other abnormalities of gait and mobility: Secondary | ICD-10-CM | POA: Diagnosis not present

## 2022-06-25 LAB — POCT GLYCOSYLATED HEMOGLOBIN (HGB A1C): HbA1c, POC (controlled diabetic range): 5.7 % (ref 0.0–7.0)

## 2022-06-25 NOTE — Assessment & Plan Note (Signed)
Blood pressure is well-controlled at this time.  Recommend continuation of current medications for management of hypertension.   

## 2022-06-25 NOTE — Assessment & Plan Note (Signed)
Management per oncology.  She continues on tamoxifen

## 2022-06-25 NOTE — Assessment & Plan Note (Signed)
Status post ablation and has done well since having this done.   Continue management per cardiology.  She remains anticoagulated with Eliquis.

## 2022-06-25 NOTE — Assessment & Plan Note (Signed)
PT did not feel that vestibular rehab would be helpful.  Use of assistive device recommended to help with steadying herself and prevent falls.

## 2022-06-25 NOTE — Patient Instructions (Addendum)
Great to see you today! Let's try physical therapy/vestibular rehab to see if this helpful for balance.   See me again in 3-4 months.

## 2022-06-25 NOTE — Assessment & Plan Note (Signed)
Lab Results  Component Value Date   HGBA1C 5.7 06/25/2022  Blood sugars are well-controlled.  Continue metformin at current strength.

## 2022-06-25 NOTE — Progress Notes (Signed)
Kayla Bennett - 71 y.o. female MRN [id number]  Date of birth: [birth year]  Subjective Chief Complaint  Patient presents with   Establish Care    HPI Kayla Bennett is a 71 y.o. female here today for initial visit to establish care.   She has a history of atrial fibrillation, HTN, T2DM, breast cancer and   She is seen by cardiology and oncology on a regular basis.    A.fib is rate controlled with metoprolol and she is anticoagulated with eliquis.  Status post ablation which seems to help.  Additionally, she is taking metoprolol and losartan-hctz for HTN.  BP has been well controlled with this.    Continues on tamoxifen for history of breast cancer.   She has had problems with balance for several months.  Physical therapy has evaluated for possible vestibular rehab but did not feel like this would help her symptoms.  She does feel unsteady at times when walking.  She has not had any falls.  MRI of the brain From November 2022 without any significant findings.  ROS:  A comprehensive ROS was completed and negative except as noted per HPI  Allergies  Allergen Reactions   Lotensin [Benazepril Hcl] Swelling   Morphine Itching and Rash   Erythromycin Base Other (See Comments)    Unknown reaction   Hydrocodone Other (See Comments)    Unknown reaction     Other Swelling    Mustard - vaginal swelling/pain    Banana Swelling    vaginal swelling/pain   Chocolate Swelling    vaginal swelling/pain   Gluten Meal Diarrhea    Upset stomach    Tizanidine Other (See Comments)    Lowered blood pressure    Wound Dressing Adhesive Other (See Comments)    Skin burns, blisters     Past Medical History:  Diagnosis Date   Allergy 1978   Food allergies, seasonal allergies, medicine allergies   Anemia    Arthritis    Asthma    Blood transfusion without reported diagnosis 1985   Breast cancer ([place])    Diabetes ([place])    Dyspnea    Dysrhythmia    afibb on occasion   Family history  of adverse reaction to anesthesia    mother had reaction to versed...during surgery, stopped breathing   Fibromyalgia    GERD (gastroesophageal reflux disease)    Headache    History of hiatal hernia    Hyperlipidemia 1990?   Hypertension    Migraines    Osteopenia    Osteoporosis    Oxygen deficiency 2023   Psoriasis    Sleep apnea 2023   Yeast infection 10/30/2021    Past Surgical History:  Procedure Laterality Date   ABDOMINAL HYSTERECTOMY     APPENDECTOMY     ATRIAL FIBRILLATION ABLATION N/A 12/10/2021   Procedure: ATRIAL FIBRILLATION ABLATION;  Surgeon: Constance Haw, MD;  Location: [facility];  Service: Cardiovascular;  Laterality: N/A;   BREAST LUMPECTOMY WITH RADIOACTIVE SEED AND SENTINEL LYMPH NODE BIOPSY Right 02/18/2021   Procedure: RIGHT BREAST LUMPECTOMY WITH RADIOACTIVE SEED AND SENTINEL LYMPH NODE BIOPSY;  Surgeon: [name], MD;  Location: [facility];  Service: General;  Laterality: Right;   BREAST SURGERY  2022   broken wrist     CARDIOVERSION N/A 04/04/2021   Procedure: CARDIOVERSION;  Surgeon: Sueanne Margarita, MD;  Location: [facility];  Service: Cardiovascular;  Laterality: N/A;   CARDIOVERSION N/A 08/14/2021   Procedure: CARDIOVERSION;  Surgeon: O'Neal,  Cassie Freer, MD;  Location: [facility];  Service: Cardiovascular;  Laterality: N/A;   CARDIOVERSION N/A 09/19/2021   Procedure: CARDIOVERSION;  Surgeon: Geralynn Rile, MD;  Location: [facility];  Service: Cardiovascular;  Laterality: N/A;   [place]     right wrist   HYSTERECTOMY ABDOMINAL WITH SALPINGO-OOPHORECTOMY     LYSIS OF ADHESION     SHOULDER SURGERY Left    TUBAL LIGATION      Social History   Socioeconomic History   Marital status: Married    Spouse name: Not on file   Number of children: 2   Years of education: Not on file   Highest education level: Not on file  Occupational History   Not on  file  Tobacco Use   Smoking status: Never   Smokeless tobacco: Never  Vaping Use   Vaping Use: Never used  Substance and Sexual Activity   Alcohol use: Never   Drug use: Never   Sexual activity: Not Currently  Other Topics Concern   Not on file  Social History Narrative   Not on file   Social Determinants of Health   Financial Resource Strain: Not on file  Food Insecurity: Not on file  Transportation Needs: Not on file  Physical Activity: Not on file  Stress: Not on file  Social Connections: Not on file    Family History  Problem Relation Age of Onset   Atrial fibrillation Mother    Heart attack Mother 79   Arthritis Mother    Cancer Mother    Diabetes Mother    Heart disease Mother    Hypertension Mother    Colon cancer Father 76   Heart attack Father 53   Cancer Father    Early death Father    Heart disease Father    Hypertension Father    Heart attack Paternal Grandmother 41   Cancer Maternal Uncle        melanoma    Colon cancer Paternal Uncle    Cancer Paternal Uncle    Diabetes Paternal Uncle    Heart disease Paternal Uncle    Obesity Paternal Uncle    Colon cancer Cousin    Atrial fibrillation Cousin    Stroke Maternal Grandmother    Stroke Paternal Grandfather    Arthritis Maternal Aunt    Diabetes Maternal Aunt    Hearing loss Maternal Aunt    Cancer Paternal Uncle    Heart disease Paternal Uncle    Obesity Paternal Uncle    Diabetes Maternal Aunt    Diabetes Maternal Aunt    Heart disease Maternal Aunt    Diabetes Maternal Uncle    Hearing loss Maternal Uncle    Diabetes Paternal Aunt    Hearing loss Paternal Aunt    Diabetes Sister    Hyperlipidemia Sister    Hypertension Sister    Diabetes Paternal Uncle    Hearing loss Paternal Uncle    Diabetes Paternal Uncle    Hearing loss Paternal Uncle    Heart disease Paternal Uncle    Hypertension Paternal Uncle    Diabetes Paternal Uncle    Stroke Maternal Uncle     Health Maintenance   Topic Date Due   INFLUENZA VACCINE  06/17/2022   COVID-19 Vaccine (3 - Booster for Janssen series) 07/11/2022 (Originally 12/29/2020)   Zoster Vaccines- Shingrix (1 of 2) 09/25/2022 (Originally 02/17/1970)   Diabetic kidney evaluation -  Urine ACR  10/17/2022 (Originally 09/03/2021)   Pneumonia Vaccine 81+ Years old (1 - PCV) 06/26/2023 (Originally 02/18/2016)   COLONOSCOPY (Pts 45-62yr Insurance coverage will need to be confirmed)  06/26/2023 (Originally 02/18/1996)   Hepatitis C Screening  06/26/2023 (Originally 02/17/1969)   MAMMOGRAM  11/05/2022   HEMOGLOBIN A1C  12/26/2022   Diabetic kidney evaluation - GFR measurement  02/22/2023   OPHTHALMOLOGY EXAM  06/06/2023   FOOT EXAM  06/26/2023   TETANUS/TDAP  05/31/2024   DEXA SCAN  Completed   HPV VACCINES  Aged Out     ----------------------------------------------------------------------------------------------------------------------------------------------------------------------------------------------------------------- Physical Exam BP 119/75 (BP Location: Left Arm, Patient Position: Sitting, Cuff Size: Large)   Pulse 72   Ht 5' 1.5" (1.562 m)   Wt 198 lb 8 oz (90 kg)   SpO2 92%   BMI 36.90 kg/m   Physical Exam Constitutional:      Appearance: Normal appearance.  Eyes:     General: No scleral icterus. Cardiovascular:     Rate and Rhythm: Normal rate and regular rhythm.  Pulmonary:     Effort: Pulmonary effort is normal.     Breath sounds: Normal breath sounds.  Neurological:     Mental Status: She is alert.  Psychiatric:        Mood and Affect: Mood normal.        Behavior: Behavior normal.     ------------------------------------------------------------------------------------------------------------------------------------------------------------------------------------------------------------------- Assessment and Plan  Persistent atrial fibrillation ([place] Status post ablation and has done well since having this  done.   Continue management per cardiology.  She remains anticoagulated with Eliquis.  Hypertension Blood pressure is well-controlled at this time.  Recommend continuation of current medications for management of hypertension.  Diabetes ([facility] Lab Results  Component Value Date   HGBA1C 5.7 06/25/2022  Blood sugars are well-controlled.  Continue metformin at current strength.  Malignant neoplasm of upper-outer quadrant of right breast in female, estrogen receptor positive ([place] [facility] per oncology.  She continues on tamoxifen  Balance problem PT did not feel that vestibular rehab would be helpful.  Use of assistive device recommended to help with steadying herself and prevent falls.   No orders of the defined types were placed in this encounter.   Return in about 3 months (around 09/25/2022) for T2DM/HTN.    This visit occurred during the SARS-CoV-2 public health emergency.  Safety protocols were in place, including screening questions prior to the visit, additional usage of staff PPE, and extensive cleaning of exam room while observing appropriate contact time as indicated for disinfecting solutions.

## 2022-07-07 DIAGNOSIS — L570 Actinic keratosis: Secondary | ICD-10-CM | POA: Diagnosis not present

## 2022-07-07 DIAGNOSIS — L820 Inflamed seborrheic keratosis: Secondary | ICD-10-CM | POA: Diagnosis not present

## 2022-07-07 DIAGNOSIS — L853 Xerosis cutis: Secondary | ICD-10-CM | POA: Diagnosis not present

## 2022-07-07 DIAGNOSIS — L814 Other melanin hyperpigmentation: Secondary | ICD-10-CM | POA: Diagnosis not present

## 2022-07-07 DIAGNOSIS — D485 Neoplasm of uncertain behavior of skin: Secondary | ICD-10-CM | POA: Diagnosis not present

## 2022-07-07 DIAGNOSIS — L404 Guttate psoriasis: Secondary | ICD-10-CM | POA: Diagnosis not present

## 2022-07-07 DIAGNOSIS — B079 Viral wart, unspecified: Secondary | ICD-10-CM | POA: Diagnosis not present

## 2022-07-07 DIAGNOSIS — L821 Other seborrheic keratosis: Secondary | ICD-10-CM | POA: Diagnosis not present

## 2022-07-09 ENCOUNTER — Encounter: Payer: Self-pay | Admitting: General Practice

## 2022-07-24 ENCOUNTER — Encounter: Payer: Self-pay | Admitting: Cardiology

## 2022-07-24 ENCOUNTER — Ambulatory Visit: Payer: Medicare Other | Attending: Cardiology | Admitting: Cardiology

## 2022-07-24 VITALS — BP 118/70 | HR 82 | Ht 61.5 in | Wt 199.8 lb

## 2022-07-24 DIAGNOSIS — I10 Essential (primary) hypertension: Secondary | ICD-10-CM

## 2022-07-24 DIAGNOSIS — G4733 Obstructive sleep apnea (adult) (pediatric): Secondary | ICD-10-CM

## 2022-07-24 NOTE — Progress Notes (Signed)
Sleep Medicine office note    Date:  07/24/2022   ID:  REATHA SUR, DOB Oct 23, 1951, MRN [id number]  PCP:  Luetta Nutting, DO  Cardiologist:  Gwyndolyn Kaufman, MD  Chief Complaint  Patient presents with   Sleep Apnea   Hypertension    History of Present Illness:  ANSLIE SPADAFORA is a 71 y.o. female with a history of diabetes, atrial fibrillation, GERD, hypertension who was referred by Dr. Curt Bears for evaluation for possible obstructive sleep apnea as a cause of atrial fibrillation.  She underwent home sleep study on 11/18/2021 which showed severe obstructive sleep apnea with an AHI of 55.8/h, moderate central sleep apnea with AHIc of 27.5/h with 39% of the time Cheyne-Stokes respirations.  She had nocturnal hypoxemia with O2 sat nadir 71% and had persistent O2 desaturations less than 88% for over 1 hour.    She underwent CPAP titration on 01/29/2022 but due to ongoing respiratory events could not be adequately titrated and was started on BiPAP titration and an auto BiPAP with IPAP max 20 cm H2O, EPAP min 5 cm H2O and pressure support 4 cm H2O was ordered.    At her last office visit her AHI was as too high due to a a large mask leak.  We set her up with DME for repeat mask fit with plan is to send back to lab for auto BiPAP ST titration or consider ASV given complex sleep apnea if AHI remains elevated.  She has had some issues with [employer] and was told that there was nothing wrong with her mask.  She has not changed out her cushion since she started and it also took a long time to get her O2.  She is now on O2 at 2L via her mask nightly.   She is doing well with her BiPAP device and thinks that she has gotten used to it.  She tolerates the Full face mask and feels the pressure is adequate.  Since going on BiPAP she feels rested in the am and has no significant daytime sleepiness.  She denies any significant nasal dryness or nasal congestion.  She does have mouth dryness. She does have  problems with bloating and thinks she is swallowing air.  She does not think that he snores.    Past Medical History:  Diagnosis Date   Allergy 1978   Food allergies, seasonal allergies, medicine allergies   Anemia    Arthritis    Asthma    Blood transfusion without reported diagnosis 1985   Breast cancer ([place])    Diabetes ([place])    Dyspnea    Dysrhythmia    afibb on occasion   Family history of adverse reaction to anesthesia    mother had reaction to versed...during surgery, stopped breathing   Fibromyalgia    GERD (gastroesophageal reflux disease)    Headache    History of hiatal hernia    Hyperlipidemia 1990?   Hypertension    Migraines    Osteopenia    Osteoporosis    Oxygen deficiency 2023   Psoriasis    Sleep apnea 2023   Yeast infection 10/30/2021    Past Surgical History:  Procedure Laterality Date   ABDOMINAL HYSTERECTOMY     APPENDECTOMY     ATRIAL FIBRILLATION ABLATION N/A 12/10/2021   Procedure: ATRIAL FIBRILLATION ABLATION;  Surgeon: Constance Haw, MD;  Location: [facility];  Service: Cardiovascular;  Laterality: N/A;   BREAST LUMPECTOMY WITH RADIOACTIVE SEED AND SENTINEL LYMPH  NODE BIOPSY Right 02/18/2021   Procedure: RIGHT BREAST LUMPECTOMY WITH RADIOACTIVE SEED AND SENTINEL LYMPH NODE BIOPSY;  Surgeon: [name], MD;  Location: [facility];  Service: General;  Laterality: Right;   BREAST SURGERY  2022   broken wrist     CARDIOVERSION N/A 04/04/2021   Procedure: CARDIOVERSION;  Surgeon: Sueanne Margarita, MD;  Location: [facility];  Service: Cardiovascular;  Laterality: N/A;   CARDIOVERSION N/A 08/14/2021   Procedure: CARDIOVERSION;  Surgeon: Geralynn Rile, MD;  Location: [facility];  Service: Cardiovascular;  Laterality: N/A;   CARDIOVERSION N/A 09/19/2021   Procedure: CARDIOVERSION;  Surgeon: Geralynn Rile, MD;  Location: [facility];  Service: Cardiovascular;  Laterality: N/A;   [place]     right wrist   HYSTERECTOMY ABDOMINAL WITH SALPINGO-OOPHORECTOMY     LYSIS OF ADHESION     SHOULDER SURGERY Left    TUBAL LIGATION      Current Medications: Current Meds  Medication Sig   acetaminophen (TYLENOL) 500 MG tablet Take 500 mg by mouth every 6 (six) hours as needed for moderate pain, headache or mild pain.   albuterol (VENTOLIN HFA) 108 (90 Base) MCG/ACT inhaler Inhale 1-2 puffs into the lungs every 6 (six) hours as needed for shortness of breath or wheezing (Asthma).   apixaban (ELIQUIS) 5 MG TABS tablet Take 1 tablet (5 mg total) by mouth 2 (two) times daily. Pt needs 90 day supply.  Please keep on file till next refill.   calcipotriene (DOVONOX) 0.005 % cream Apply 1 application topically See admin instructions. Mix with fluorouracil 5% cream and apply topically twice daily for five days - as needed for precancerous on right cheek.   cetirizine (ZYRTEC) 10 MG tablet Take 10 mg by mouth in the morning.   cholecalciferol (VITAMIN D3) 25 MCG (1000 UNIT) tablet Take 1,000-2,000 Units by mouth See admin instructions. Take 1000 units in the morning and 2000 units at bedtime   ferrous sulfate 325 (65 FE) MG tablet Take 325 mg by mouth every morning.   fluconazole (DIFLUCAN) 150 MG tablet Take 1 tablet by mouth every 72 hours as needed.   fluorouracil (EFUDEX) 5 % cream Apply 1 application topically 2 (two) times daily as needed (precancerous spots on right cheek). Mix with calcipotriene 0.005% cream and apply topically twice daily for five days - as needed for precancerous on right cheek.   gabapentin (NEURONTIN) 100 MG capsule Take 200 mg by mouth at bedtime.   losartan-hydrochlorothiazide (HYZAAR) 100-25 MG tablet Take 1 tablet by mouth daily.   metFORMIN (GLUCOPHAGE-XR) 500 MG 24 hr tablet Take 500 mg by mouth at bedtime.   metoprolol succinate (TOPROL-XL) 50 MG 24 hr tablet Take 1 tablet (50 mg total) by mouth daily. Take with or immediately  following a meal.   nystatin cream (MYCOSTATIN) in the morning and at bedtime.   pantoprazole (PROTONIX) 40 MG tablet Take 40 mg by mouth 2 (two) times daily before a meal.   potassium chloride SA (KLOR-CON M20) 20 MEQ tablet Take 1 tablet (20 mEq total) by mouth daily.   Prenatal Vit-Fe Fumarate-FA (PRENATAL VITAMIN PLUS LOW IRON PO) Take 1 tablet by mouth in the morning.   SUMAtriptan (IMITREX) 100 MG tablet Take 100 mg by mouth See admin instructions. Take one tablet (100 mg) by mouth at onset of migraine headache, may repeat in 2 hours if headache persists or recurs.  tamoxifen (NOLVADEX) 20 MG tablet Take 1 tablet (20 mg total) by mouth every morning.   triamcinolone (KENALOG) 0.1 % Apply 1 application topically 2 (two) times daily as needed (psoriasis). Mixed with Cetaphil 1:1   venlafaxine XR (EFFEXOR-XR) 37.5 MG 24 hr capsule Take 37.5 mg by mouth at bedtime. Take 1 capsule (75 mg) + 1 capsule (37.5 mg) to equal 112.5 mg at bedtime   venlafaxine XR (EFFEXOR-XR) 75 MG 24 hr capsule Take 75 mg by mouth at bedtime. Take 1 capsule (75 mg) + 1 capsule (37.5 mg) to equal 112.5 mg  at bedtime   vitamin B-12 (CYANOCOBALAMIN) 1000 MCG tablet Take 1,000 mcg by mouth every morning.   vitamin C (ASCORBIC ACID) 500 MG tablet Take 500 mg by mouth daily.    Allergies:   Lotensin [benazepril hcl], Morphine, Erythromycin base, Hydrocodone, Other, Banana, Chocolate, Gluten meal, Tizanidine, and Wound dressing adhesive   Social History   Socioeconomic History   Marital status: Married    Spouse name: Not on file   Number of children: 2   Years of education: Not on file   Highest education level: Not on file  Occupational History   Not on file  Tobacco Use   Smoking status: Never   Smokeless tobacco: Never  Vaping Use   Vaping Use: Never used  Substance and Sexual Activity   Alcohol use: Never   Drug use: Never   Sexual activity: Not Currently  Other Topics Concern   Not on file  Social  History Narrative   Not on file   Social Determinants of Health   Financial Resource Strain: Not on file  Food Insecurity: Not on file  Transportation Needs: Not on file  Physical Activity: Not on file  Stress: Not on file  Social Connections: Not on file     Family History:  The patient's family history includes Arthritis in her maternal aunt and mother; Atrial fibrillation in her cousin and mother; Cancer in her father, maternal uncle, mother, paternal uncle, and paternal uncle; Colon cancer in her cousin and paternal uncle; Colon cancer (age of onset: 34) in her father; Diabetes in her maternal aunt, maternal aunt, maternal aunt, maternal uncle, mother, paternal aunt, paternal uncle, paternal uncle, paternal uncle, paternal uncle, and sister; Early death in her father; Hearing loss in her maternal aunt, maternal uncle, paternal aunt, paternal uncle, and paternal uncle; Heart attack (age of onset: 3) in her father; Heart attack (age of onset: 25) in her paternal grandmother; Heart attack (age of onset: 61) in her mother; Heart disease in her father, maternal aunt, mother, paternal uncle, paternal uncle, and paternal uncle; Hyperlipidemia in her sister; Hypertension in her father, mother, paternal uncle, and sister; Obesity in her paternal uncle and paternal uncle; Stroke in her maternal grandmother, maternal uncle, and paternal grandfather.   ROS:   Please see the history of present illness.    ROS All other systems reviewed and are negative.      No data to display             PHYSICAL EXAM:   VS:  BP 118/70   Pulse 82   Ht 5' 1.5" (1.562 m)   Wt 199 lb 12.8 oz (90.6 kg)   SpO2 94%   BMI 37.14 kg/m    GEN: Well nourished, well developed in no acute distress HEENT: Normal NECK: No JVD; No carotid bruits LYMPHATICS: No lymphadenopathy CARDIAC:RRR, no murmurs, rubs, gallops RESPIRATORY:  Clear to auscultation without  rales, wheezing or rhonchi  ABDOMEN: Soft, non-tender,  non-distended MUSCULOSKELETAL:  No edema; No deformity  SKIN: Warm and dry NEUROLOGIC:  Alert and oriented x 3 PSYCHIATRIC:  Normal affect  Wt Readings from Last 3 Encounters:  07/24/22 199 lb 12.8 oz (90.6 kg)  06/25/22 198 lb 8 oz (90 kg)  05/12/22 198 lb 6 oz (90 kg)      Studies/Labs Reviewed:   EKG:  EKG is not ordered today.    Recent Labs: 10/12/2021: B Natriuretic Peptide 277.5 10/13/2021: TSH 2.875 10/16/2021: Magnesium 1.9 02/21/2022: ALT 23; BUN 12; Creatinine 0.71; Hemoglobin 11.2; Platelet Count 151; Potassium 3.4; Sodium 140   Lipid Panel No results found for: "CHOL", "TRIG", "HDL", "CHOLHDL", "VLDL", "LDLCALC", "LDLDIRECT"   CHA2DS2-VASc Score = 4   This indicates a 4.8% annual risk of stroke. The patient's score is based upon: CHF History: 0 HTN History: 1 Diabetes History: 1 Stroke History: 0 Vascular Disease History: 0 Age Score: 1 Gender Score: 1   Additional studies/ records that were reviewed today include:   Home sleep study, CPAP/BiPAP titration and Pap compliance download   ASSESSMENT:    1. OSA (obstructive sleep apnea)   2. Primary hypertension      PLAN:  In order of problems listed above:  OSA - The patient is tolerating PAP therapy well without any problems. The PAP download performed by his DME was personally reviewed and interpreted by me today and showed an AHI of 9.3/hr on auto BiPAP cm H2O with 69% compliance in using more than 4 hours nightly.  The patient has been using and benefiting from PAP use and will continue to benefit from therapy.  -her data still shows a large mask leak>>I have asked her to change out her cushion every 4-6 weeks -I will also check an ONO on O2 ant BiPAP -will repeat download in 4 weeks -she has not had any afib since going on BiPAP -add chin strap to see if it helps with bloating and mouth dryness  Hypertension -BP is controlled on exam today -Continue prescription drug management losartan HCT  100-25 mg daily and Toprol-XL 50 mg daily with as needed refills   time Spent: 25 minutes total time of encounter, including 15 minutes spent in face-to-face patient care on the date of this encounter. This time includes coordination of care and counseling regarding above mentioned problem list. Remainder of non-face-to-face time involved reviewing chart documents/testing relevant to the patient encounter and documentation in the medical record. I have independently reviewed documentation from referring provider  Medication Adjustments/Labs and Tests Ordered: Current medicines are reviewed at length with the patient today.  Concerns regarding medicines are outlined above.  Medication changes, Labs and Tests ordered today are listed in the Patient Instructions below.     Signed, Fransico Him, MD [facility], [facility] 07/24/2022 9:19 AM    [facility] [address], [place], [place]  [address] Phone: [phone]; Fax: [phone]

## 2022-07-24 NOTE — Patient Instructions (Signed)
Medication Instructions:  Your physician recommends that you continue on your current medications as directed. Please refer to the Current Medication list given to you today.  *If you need a refill on your cardiac medications before your next appointment, please call your pharmacy*  Testing/Procedures: Overnight pulse oximeter on O2 and BiPAP.  Follow-Up: At [facility], you and your health needs are our priority.  As part of our continuing mission to provide you with exceptional heart care, we have created designated Provider Care Teams.  These Care Teams include your primary Cardiologist (physician) and Advanced Practice Providers (APPs -  Physician Assistants and Nurse Practitioners) who all work together to provide you with the care you need, when you need it.  Your next appointment:   3 month(s)  The format for your next appointment:   In Person  Provider:   Fransico Him, MD  Important Information About Sugar

## 2022-07-24 NOTE — Addendum Note (Signed)
Addended by: Antonieta Iba on: 07/24/2022 09:47 AM   Modules accepted: Orders

## 2022-07-25 ENCOUNTER — Other Ambulatory Visit: Payer: Self-pay

## 2022-07-25 ENCOUNTER — Telehealth: Payer: Self-pay | Admitting: Cardiology

## 2022-07-25 MED ORDER — APIXABAN 5 MG PO TABS: 5.0000 mg | ORAL_TABLET | Freq: Two times a day (BID) | ORAL | 3 refills | Status: DC

## 2022-07-25 NOTE — Telephone Encounter (Signed)
Prescription refill request for Eliquis received. Indication:Afib Last office visit:9/23 Scr:0.7 Age: 71 Weight:90.6 kg  Prescription refilled

## 2022-07-25 NOTE — Telephone Encounter (Signed)
*  STAT* If patient is at the pharmacy, call can be transferred to refill team.   1. Which medications need to be refilled? (please list name of each medication and dose if known) apixaban (ELIQUIS) 5 MG TABS tablet   2. Which pharmacy/location (including street and city if local pharmacy) is medication to be sent to? [place], [place] [address]  3. Do they need a 30 day or 90 day supply? 90 day  Patient ran out Saturday

## 2022-08-13 ENCOUNTER — Other Ambulatory Visit: Payer: Self-pay | Admitting: Family Medicine

## 2022-08-20 ENCOUNTER — Encounter: Payer: Self-pay | Admitting: Hematology

## 2022-08-21 ENCOUNTER — Emergency Department (HOSPITAL_COMMUNITY): Payer: Medicare Other

## 2022-08-21 ENCOUNTER — Encounter (HOSPITAL_COMMUNITY): Payer: Self-pay

## 2022-08-21 ENCOUNTER — Other Ambulatory Visit: Payer: Self-pay

## 2022-08-21 ENCOUNTER — Inpatient Hospital Stay (HOSPITAL_COMMUNITY)
Admission: EM | Admit: 2022-08-21 | Discharge: 2022-08-23 | DRG: 871 | Disposition: A | Discharge status: Home / Self Care | Payer: Medicare Other | Attending: Internal Medicine | Admitting: Internal Medicine

## 2022-08-21 DIAGNOSIS — N390 Urinary tract infection, site not specified: Secondary | ICD-10-CM | POA: Diagnosis not present

## 2022-08-21 DIAGNOSIS — M797 Fibromyalgia: Secondary | ICD-10-CM | POA: Diagnosis present

## 2022-08-21 DIAGNOSIS — J45909 Unspecified asthma, uncomplicated: Secondary | ICD-10-CM | POA: Diagnosis present

## 2022-08-21 DIAGNOSIS — Z170 Estrogen receptor positive status [ER+]: Secondary | ICD-10-CM

## 2022-08-21 DIAGNOSIS — L409 Psoriasis, unspecified: Secondary | ICD-10-CM | POA: Diagnosis present

## 2022-08-21 DIAGNOSIS — Z1152 Encounter for screening for COVID-19: Secondary | ICD-10-CM | POA: Diagnosis not present

## 2022-08-21 DIAGNOSIS — E876 Hypokalemia: Secondary | ICD-10-CM | POA: Diagnosis present

## 2022-08-21 DIAGNOSIS — C50411 Malignant neoplasm of upper-outer quadrant of right female breast: Secondary | ICD-10-CM

## 2022-08-21 DIAGNOSIS — G9341 Metabolic encephalopathy: Secondary | ICD-10-CM | POA: Diagnosis present

## 2022-08-21 DIAGNOSIS — R531 Weakness: Secondary | ICD-10-CM

## 2022-08-21 DIAGNOSIS — A084 Viral intestinal infection, unspecified: Secondary | ICD-10-CM | POA: Diagnosis present

## 2022-08-21 DIAGNOSIS — R509 Fever, unspecified: Secondary | ICD-10-CM | POA: Diagnosis not present

## 2022-08-21 DIAGNOSIS — I4819 Other persistent atrial fibrillation: Secondary | ICD-10-CM | POA: Diagnosis present

## 2022-08-21 DIAGNOSIS — E860 Dehydration: Secondary | ICD-10-CM | POA: Diagnosis present

## 2022-08-21 DIAGNOSIS — Z6837 Body mass index (BMI) 37.0-37.9, adult: Secondary | ICD-10-CM | POA: Diagnosis not present

## 2022-08-21 DIAGNOSIS — M810 Age-related osteoporosis without current pathological fracture: Secondary | ICD-10-CM | POA: Diagnosis present

## 2022-08-21 DIAGNOSIS — Z853 Personal history of malignant neoplasm of breast: Secondary | ICD-10-CM | POA: Diagnosis not present

## 2022-08-21 DIAGNOSIS — Z79899 Other long term (current) drug therapy: Secondary | ICD-10-CM

## 2022-08-21 DIAGNOSIS — G9349 Other encephalopathy: Secondary | ICD-10-CM | POA: Diagnosis present

## 2022-08-21 DIAGNOSIS — K529 Noninfective gastroenteritis and colitis, unspecified: Secondary | ICD-10-CM | POA: Diagnosis present

## 2022-08-21 DIAGNOSIS — Z8249 Family history of ischemic heart disease and other diseases of the circulatory system: Secondary | ICD-10-CM | POA: Diagnosis not present

## 2022-08-21 DIAGNOSIS — E119 Type 2 diabetes mellitus without complications: Secondary | ICD-10-CM | POA: Diagnosis present

## 2022-08-21 DIAGNOSIS — Z79810 Long term (current) use of selective estrogen receptor modulators (SERMs): Secondary | ICD-10-CM

## 2022-08-21 DIAGNOSIS — I10 Essential (primary) hypertension: Secondary | ICD-10-CM | POA: Diagnosis not present

## 2022-08-21 DIAGNOSIS — E669 Obesity, unspecified: Secondary | ICD-10-CM | POA: Diagnosis present

## 2022-08-21 DIAGNOSIS — Z7901 Long term (current) use of anticoagulants: Secondary | ICD-10-CM

## 2022-08-21 DIAGNOSIS — R7401 Elevation of levels of liver transaminase levels: Secondary | ICD-10-CM | POA: Diagnosis present

## 2022-08-21 DIAGNOSIS — K219 Gastro-esophageal reflux disease without esophagitis: Secondary | ICD-10-CM | POA: Diagnosis present

## 2022-08-21 DIAGNOSIS — Z800 Family history of malignant neoplasm of digestive organs: Secondary | ICD-10-CM

## 2022-08-21 DIAGNOSIS — A419 Sepsis, unspecified organism: Principal | ICD-10-CM | POA: Diagnosis present

## 2022-08-21 DIAGNOSIS — Z833 Family history of diabetes mellitus: Secondary | ICD-10-CM

## 2022-08-21 DIAGNOSIS — M1990 Unspecified osteoarthritis, unspecified site: Secondary | ICD-10-CM | POA: Diagnosis present

## 2022-08-21 DIAGNOSIS — E872 Acidosis, unspecified: Secondary | ICD-10-CM | POA: Diagnosis present

## 2022-08-21 DIAGNOSIS — R197 Diarrhea, unspecified: Secondary | ICD-10-CM | POA: Diagnosis present

## 2022-08-21 DIAGNOSIS — Z823 Family history of stroke: Secondary | ICD-10-CM

## 2022-08-21 DIAGNOSIS — Z881 Allergy status to other antibiotic agents status: Secondary | ICD-10-CM

## 2022-08-21 DIAGNOSIS — Z9109 Other allergy status, other than to drugs and biological substances: Secondary | ICD-10-CM

## 2022-08-21 DIAGNOSIS — E785 Hyperlipidemia, unspecified: Secondary | ICD-10-CM | POA: Diagnosis present

## 2022-08-21 DIAGNOSIS — Z8261 Family history of arthritis: Secondary | ICD-10-CM

## 2022-08-21 DIAGNOSIS — R4182 Altered mental status, unspecified: Secondary | ICD-10-CM | POA: Diagnosis not present

## 2022-08-21 DIAGNOSIS — R109 Unspecified abdominal pain: Secondary | ICD-10-CM | POA: Diagnosis not present

## 2022-08-21 DIAGNOSIS — I700 Atherosclerosis of aorta: Secondary | ICD-10-CM | POA: Diagnosis not present

## 2022-08-21 DIAGNOSIS — Z888 Allergy status to other drugs, medicaments and biological substances status: Secondary | ICD-10-CM

## 2022-08-21 DIAGNOSIS — Z885 Allergy status to narcotic agent status: Secondary | ICD-10-CM

## 2022-08-21 DIAGNOSIS — G43909 Migraine, unspecified, not intractable, without status migrainosus: Secondary | ICD-10-CM | POA: Diagnosis present

## 2022-08-21 DIAGNOSIS — A09 Infectious gastroenteritis and colitis, unspecified: Secondary | ICD-10-CM | POA: Diagnosis not present

## 2022-08-21 DIAGNOSIS — Z7984 Long term (current) use of oral hypoglycemic drugs: Secondary | ICD-10-CM

## 2022-08-21 LAB — CBC WITH DIFFERENTIAL/PLATELET
Abs Immature Granulocytes: 0.01 10*3/uL (ref 0.00–0.07)
Basophils Absolute: 0 10*3/uL (ref 0.0–0.1)
Basophils Relative: 0 %
Eosinophils Absolute: 0 10*3/uL (ref 0.0–0.5)
Eosinophils Relative: 0 %
HCT: 36.3 % (ref 36.0–46.0)
Hemoglobin: 12.2 g/dL (ref 12.0–15.0)
Immature Granulocytes: 0 %
Lymphocytes Relative: 17 %
Lymphs Abs: 0.8 10*3/uL (ref 0.7–4.0)
MCH: 32 pg (ref 26.0–34.0)
MCHC: 33.6 g/dL (ref 30.0–36.0)
MCV: 95.3 fL (ref 80.0–100.0)
Monocytes Absolute: 0.4 10*3/uL (ref 0.1–1.0)
Monocytes Relative: 8 %
Neutro Abs: 3.5 10*3/uL (ref 1.7–7.7)
Neutrophils Relative %: 75 %
Platelets: 146 10*3/uL — ABNORMAL LOW (ref 150–400)
RBC: 3.81 MIL/uL — ABNORMAL LOW (ref 3.87–5.11)
RDW: 13.2 % (ref 11.5–15.5)
WBC: 4.7 10*3/uL (ref 4.0–10.5)
nRBC: 0 % (ref 0.0–0.2)

## 2022-08-21 LAB — URINALYSIS, MICROSCOPIC (REFLEX): WBC, UA: >50 WBC/hpf (ref 0–5)

## 2022-08-21 LAB — RESP PANEL BY RT-PCR (FLU A&B, COVID) ARPGX2
Influenza A by PCR: NEGATIVE
Influenza B by PCR: NEGATIVE
SARS Coronavirus 2 by RT PCR: NEGATIVE

## 2022-08-21 LAB — URINALYSIS, ROUTINE W REFLEX MICROSCOPIC
Glucose, UA: NEGATIVE mg/dL
Ketones, ur: 15 mg/dL — AB
Nitrite: NEGATIVE
Protein, ur: 100 mg/dL — AB
Specific Gravity, Urine: 1.02 (ref 1.005–1.030)
pH: 6.5 (ref 5.0–8.0)

## 2022-08-21 LAB — COMPREHENSIVE METABOLIC PANEL WITH GFR
ALT: 98 U/L — ABNORMAL HIGH (ref 0–44)
AST: 250 U/L — ABNORMAL HIGH (ref 15–41)
Albumin: 3.5 g/dL (ref 3.5–5.0)
Alkaline Phosphatase: 40 U/L (ref 38–126)
Anion gap: 14 (ref 5–15)
BUN: 15 mg/dL (ref 8–23)
CO2: 24 mmol/L (ref 22–32)
Calcium: 9.2 mg/dL (ref 8.9–10.3)
Chloride: 100 mmol/L (ref 98–111)
Creatinine, Ser: 0.88 mg/dL (ref 0.44–1.00)
GFR, Estimated: >60 mL/min
Glucose, Bld: 134 mg/dL — ABNORMAL HIGH (ref 70–99)
Potassium: 3.1 mmol/L — ABNORMAL LOW (ref 3.5–5.1)
Sodium: 138 mmol/L (ref 135–145)
Total Bilirubin: 0.6 mg/dL (ref 0.3–1.2)
Total Protein: 6.5 g/dL (ref 6.5–8.1)

## 2022-08-21 LAB — PROTIME-INR
INR: 1.2 (ref 0.8–1.2)
Prothrombin Time: 15.1 s (ref 11.4–15.2)

## 2022-08-21 LAB — APTT: aPTT: 28 seconds (ref 24–36)

## 2022-08-21 LAB — LACTIC ACID, PLASMA
Lactic Acid, Venous: 2.6 mmol/L (ref 0.5–1.9)
Lactic Acid, Venous: 2 mmol/L (ref 0.5–1.9)

## 2022-08-21 MED ORDER — VENLAFAXINE HCL ER 75 MG PO CP24: 75.0000 mg | ORAL_CAPSULE | Freq: Every day | ORAL | Status: DC

## 2022-08-21 MED ORDER — IOHEXOL 350 MG/ML SOLN
75.0000 mL | Freq: Once | INTRAVENOUS | Status: AC | PRN
  Administered 2022-08-21: 75 mL via INTRAVENOUS

## 2022-08-21 MED ORDER — SODIUM CHLORIDE 0.9 % IV SOLN
1.0000 g | INTRAVENOUS | Status: DC
  Administered 2022-08-21 – 2022-08-22 (×2): 1 g via INTRAVENOUS
  Filled 2022-08-21 (×2): qty 10

## 2022-08-21 MED ORDER — ACETAMINOPHEN 325 MG PO TABS
650.0000 mg | ORAL_TABLET | Freq: Four times a day (QID) | ORAL | Status: DC | PRN
  Administered 2022-08-22 – 2022-08-23 (×4): 650 mg via ORAL
  Filled 2022-08-21 (×3): qty 2

## 2022-08-21 MED ORDER — LACTATED RINGERS IV BOLUS
1000.0000 mL | Freq: Once | INTRAVENOUS | Status: AC
  Administered 2022-08-21: 1000 mL via INTRAVENOUS

## 2022-08-21 MED ORDER — ONDANSETRON HCL 4 MG/2ML IJ SOLN: 4.0000 mg | Freq: Four times a day (QID) | INTRAMUSCULAR | Status: DC | PRN

## 2022-08-21 MED ORDER — OXYCODONE HCL 5 MG PO TABS
5.0000 mg | ORAL_TABLET | ORAL | Status: DC | PRN
  Filled 2022-08-21 (×2): qty 1

## 2022-08-21 MED ORDER — PIPERACILLIN-TAZOBACTAM 3.375 G IVPB
3.3750 g | Freq: Once | INTRAVENOUS | Status: DC
  Filled 2022-08-21: qty 50

## 2022-08-21 MED ORDER — PANTOPRAZOLE SODIUM 40 MG PO TBEC
40.0000 mg | DELAYED_RELEASE_TABLET | Freq: Two times a day (BID) | ORAL | Status: DC
  Administered 2022-08-22 – 2022-08-23 (×3): 40 mg via ORAL
  Filled 2022-08-21 (×3): qty 1

## 2022-08-21 MED ORDER — VENLAFAXINE HCL ER 37.5 MG PO CP24: 37.5000 mg | ORAL_CAPSULE | Freq: Every day | ORAL | Status: DC

## 2022-08-21 MED ORDER — ONDANSETRON HCL 4 MG PO TABS: 4.0000 mg | ORAL_TABLET | Freq: Four times a day (QID) | ORAL | Status: DC | PRN

## 2022-08-21 MED ORDER — TAMOXIFEN CITRATE 10 MG PO TABS
20.0000 mg | ORAL_TABLET | Freq: Every morning | ORAL | Status: DC
  Administered 2022-08-22 – 2022-08-23 (×2): 20 mg via ORAL
  Filled 2022-08-21 (×2): qty 2

## 2022-08-21 MED ORDER — VENLAFAXINE HCL ER 75 MG PO CP24
112.5000 mg | ORAL_CAPSULE | Freq: Every day | ORAL | Status: DC
  Administered 2022-08-22: 112.5 mg via ORAL
  Filled 2022-08-21 (×4): qty 1

## 2022-08-21 MED ORDER — GABAPENTIN 100 MG PO CAPS
200.0000 mg | ORAL_CAPSULE | Freq: Every day | ORAL | Status: DC
  Administered 2022-08-21 – 2022-08-22 (×2): 200 mg via ORAL
  Filled 2022-08-21 (×2): qty 2

## 2022-08-21 MED ORDER — POTASSIUM CHLORIDE CRYS ER 20 MEQ PO TBCR
40.0000 meq | EXTENDED_RELEASE_TABLET | Freq: Once | ORAL | Status: AC
  Administered 2022-08-21: 40 meq via ORAL
  Filled 2022-08-21: qty 2

## 2022-08-21 MED ORDER — APIXABAN 5 MG PO TABS
5.0000 mg | ORAL_TABLET | Freq: Two times a day (BID) | ORAL | Status: DC
  Administered 2022-08-21 – 2022-08-23 (×4): 5 mg via ORAL
  Filled 2022-08-21 (×4): qty 1

## 2022-08-21 MED ORDER — TAMOXIFEN CITRATE 20 MG PO TABS: 20.0000 mg | ORAL_TABLET | Freq: Every morning | ORAL | 3 refills | Status: DC

## 2022-08-21 MED ORDER — ACETAMINOPHEN 500 MG PO TABS
1000.0000 mg | ORAL_TABLET | Freq: Once | ORAL | Status: AC
  Administered 2022-08-21: 1000 mg via ORAL
  Filled 2022-08-21: qty 2

## 2022-08-21 MED ORDER — ONDANSETRON HCL 4 MG/2ML IJ SOLN
4.0000 mg | Freq: Once | INTRAMUSCULAR | Status: AC
  Administered 2022-08-21: 4 mg via INTRAVENOUS
  Filled 2022-08-21: qty 2

## 2022-08-21 MED ORDER — ACETAMINOPHEN 650 MG RE SUPP: 650.0000 mg | Freq: Four times a day (QID) | RECTAL | Status: DC | PRN

## 2022-08-21 NOTE — ED Notes (Signed)
The patient was wheeled to restroom to provide urine and stool sample.

## 2022-08-21 NOTE — ED Triage Notes (Signed)
Reports fever and diarrhea since last night.  Reports 10 episode that is brown water.  Husband reports 100.5 fever at home.Complains of mild abd pain. Mucous membranes are dry and tacky.  Patient is lethargic and confused.

## 2022-08-21 NOTE — ED Provider Notes (Addendum)
[facility] EMERGENCY DEPARTMENT Provider Note   CSN: [id number] Arrival date & time: 08/21/22  1727     History  Chief Complaint  Patient presents with   Fever   Diarrhea    Kayla Bennett is a 71 y.o. female. With pmh  diabetes, atrial fibrillation on Eliquis, GERD, hypertension who presents with generalized fatigue and weakness in the setting of body aches, abdominal pain and multiple episodes of nonbloody diarrhea.  Patient said she started feeling poorly yesterday and went to bed early around 9 PM.  Overnight into today she has had at least 10-15 episodes of loose brown nonbloody diarrhea.  She has had some nausea but no vomiting and has had decreased p.o. intake only having some water today.  She has had low-grade fever at home of 100.5 F.  She does note lower abdominal pain as well as body aches.  She took a COVID test at home which was negative.  She denies any headache, chest pain, shortness of breath, cough, URI symptoms.  Her husband notes some waxing and waning confusion today. She has had no recent travel outside the country and she has not used antibiotics recently.  She does have history of diverticulitis.  She also notes a history of sepsis and bacteremia from a right breast infection.  She has history of breast cancer in remission on tamoxifen.  She denies any pain swelling or skin changes of her right breast.   Fever Associated symptoms: diarrhea   Diarrhea Associated symptoms: fever        Home Medications Prior to Admission medications   Medication Sig Start Date End Date Taking? Authorizing Provider  acetaminophen (TYLENOL) 500 MG tablet Take 500 mg by mouth every 6 (six) hours as needed for moderate pain, headache or mild pain.    [provider]  albuterol (VENTOLIN HFA) 108 (90 Base) MCG/ACT inhaler Inhale 1-2 puffs into the lungs every 6 (six) hours as needed for shortness of breath or wheezing (Asthma).    [provider]  apixaban (ELIQUIS) 5 MG TABS tablet Take 1 tablet (5 mg total) by mouth 2 (two) times daily. Pt needs 90 day supply.  Please keep on file till next refill. 07/25/22 07/25/23  Camnitz, Ocie Doyne, MD  calcipotriene (DOVONOX) 0.005 % cream Apply 1 application topically See admin instructions. Mix with fluorouracil 5% cream and apply topically twice daily for five days - as needed for precancerous on right cheek.    [provider]  cetirizine (ZYRTEC) 10 MG tablet Take 10 mg by mouth in the morning.    [provider]  cholecalciferol (VITAMIN D3) 25 MCG (1000 UNIT) tablet Take 1,000-2,000 Units by mouth See admin instructions. Take 1000 units in the morning and 2000 units at bedtime    [provider]  ferrous sulfate 325 (65 FE) MG tablet Take 325 mg by mouth every morning.    [provider]  fluconazole (DIFLUCAN) 150 MG tablet Take 1 tablet by mouth every 72 hours as needed. 11/14/21   Golden Circle, FNP  fluorouracil (EFUDEX) 5 % cream Apply 1 application topically 2 (two) times daily as needed (precancerous spots on right cheek). Mix with calcipotriene 0.005% cream and apply topically twice daily for five days - as needed for precancerous on right cheek.    [provider]  gabapentin (NEURONTIN) 100 MG capsule Take 200 mg by mouth at bedtime.    [provider]  losartan-hydrochlorothiazide (HYZAAR) 100-25 MG  tablet Take 1 tablet by mouth daily. 05/13/22   Fenton, Clint R, PA  metFORMIN (GLUCOPHAGE-XR) 500 MG 24 hr tablet TAKE ONE TABLET BY MOUTH EVERY DAY 08/14/22   Luetta Nutting, DO  metoprolol succinate (TOPROL-XL) 50 MG 24 hr tablet Take 1 tablet (50 mg total) by mouth daily. Take with or immediately following a meal. 04/22/22   Freada Bergeron, MD  nystatin cream (MYCOSTATIN) in the morning and at bedtime. 03/24/22   [provider]  pantoprazole (PROTONIX) 40 MG tablet Take 40 mg by mouth 2 (two) times daily before a meal.     [provider]  potassium chloride SA (KLOR-CON M20) 20 MEQ tablet Take 1 tablet (20 mEq total) by mouth daily. 02/25/22   Fenton, Clint R, PA  Prenatal Vit-Fe Fumarate-FA (PRENATAL VITAMIN PLUS LOW IRON PO) Take 1 tablet by mouth in the morning.    [provider]  SUMAtriptan (IMITREX) 100 MG tablet Take 100 mg by mouth See admin instructions. Take one tablet (100 mg) by mouth at onset of migraine headache, may repeat in 2 hours if headache persists or recurs.    [provider]  tamoxifen (NOLVADEX) 20 MG tablet Take 1 tablet (20 mg total) by mouth every morning. 08/21/22   Truitt Merle, MD  triamcinolone (KENALOG) 0.1 % Apply 1 application topically 2 (two) times daily as needed (psoriasis). Mixed with Cetaphil 1:1    [provider]  venlafaxine XR (EFFEXOR-XR) 37.5 MG 24 hr capsule Take 37.5 mg by mouth at bedtime. Take 1 capsule (75 mg) + 1 capsule (37.5 mg) to equal 112.5 mg at bedtime 02/07/21   [provider]  venlafaxine XR (EFFEXOR-XR) 75 MG 24 hr capsule Take 75 mg by mouth at bedtime. Take 1 capsule (75 mg) + 1 capsule (37.5 mg) to equal 112.5 mg  at bedtime 09/29/20   [provider]  vitamin B-12 (CYANOCOBALAMIN) 1000 MCG tablet Take 1,000 mcg by mouth every morning.    [provider]  vitamin C (ASCORBIC ACID) 500 MG tablet Take 500 mg by mouth daily.    [provider]      Allergies    Lotensin [benazepril hcl], Morphine, Erythromycin base, Hydrocodone, Other, Banana, Chocolate, Gluten meal, Tizanidine, and Wound dressing adhesive    Review of Systems   Review of Systems  Constitutional:  Positive for fever.  Gastrointestinal:  Positive for diarrhea.    Physical Exam Updated Vital Signs BP (!) 144/70   Pulse (!) 102   Temp 98.3 F (36.8 C) (Oral)   Resp 18   Ht '5\' 1"'$  (1.549 m)   Wt 90.3 kg   SpO2 100%   BMI 37.60 kg/m  Physical Exam Constitutional: Alert and oriented.  Fatigued and uncomfortable  but no acute distress Eyes: Conjunctivae are normal. ENT      Head: Normocephalic and atraumatic.      Nose: No congestion.      Mouth/Throat: Mucous membranes are dry with peeling of the lips.      Neck: No stridor. Cardiovascular: S1, S2, tachycardic, regular rhythm.Warm and well perfused. Respiratory: Normal respiratory effort. Breath sounds are normal.  O2 sat 100 on RA. Gastrointestinal: Soft and nondistended with RUQ, left upper quadrant tenderness, left mid abdomen and left lower quadrant tenderness and suprapubic tenderness with voluntary guarding, no rebound Musculoskeletal: Normal range of motion in all extremities. No pitting edema Neurologic: Normal speech and language.  No facial droop.  Moves all 4 extremities .  Sensation  grossly intact.  No gross focal neurologic deficits are appreciated. Skin: Skin is warm, dry and intact. No rash noted. Psychiatric: Mood and affect are normal. Speech and behavior are normal.  ED Results / Procedures / Treatments   Labs (all labs ordered are listed, but only abnormal results are displayed) Labs Reviewed  LACTIC ACID, PLASMA - Abnormal; Notable for the following components:      Result Value   Lactic Acid, Venous 2.6 (*)    All other components within normal limits  COMPREHENSIVE METABOLIC PANEL - Abnormal; Notable for the following components:   Potassium 3.1 (*)    Glucose, Bld 134 (*)    AST 250 (*)    ALT 98 (*)    All other components within normal limits  CBC WITH DIFFERENTIAL/PLATELET - Abnormal; Notable for the following components:   RBC 3.81 (*)    Platelets 146 (*)    All other components within normal limits  URINALYSIS, ROUTINE W REFLEX MICROSCOPIC - Abnormal; Notable for the following components:   APPearance TURBID (*)    Hgb urine dipstick SMALL (*)    Bilirubin Urine SMALL (*)    Ketones, ur 15 (*)    Protein, ur 100 (*)    Leukocytes,Ua MODERATE (*)    All other components within normal limits  URINALYSIS,  MICROSCOPIC (REFLEX) - Abnormal; Notable for the following components:   Bacteria, UA MANY (*)    All other components within normal limits  RESP PANEL BY RT-PCR (FLU A&B, COVID) ARPGX2  CULTURE, BLOOD (ROUTINE X 2)  CULTURE, BLOOD (ROUTINE X 2)  URINE CULTURE  GASTROINTESTINAL PANEL BY PCR, STOOL (REPLACES STOOL CULTURE)  C DIFFICILE QUICK SCREEN W PCR REFLEX    PROTIME-INR  APTT  LACTIC ACID, PLASMA    EKG EKG Interpretation  Date/Time:  Thursday August 21 2022 19:17:46 EDT Ventricular Rate:  83 PR Interval:  175 QRS Duration: 76 QT Interval:  362 QTC Calculation: 426 R Axis:   60 Text Interpretation: Sinus rhythm Confirmed by Georgina Snell [id number]) on 08/21/2022 8:00:26 PM  Radiology CT ABDOMEN PELVIS W CONTRAST  Result Date: 08/21/2022 CLINICAL DATA:  Fever, diarrhea, pain EXAM: CT ABDOMEN AND PELVIS WITH CONTRAST TECHNIQUE: Multidetector CT imaging of the abdomen and pelvis was performed using the standard protocol following bolus administration of intravenous contrast. RADIATION DOSE REDUCTION: This exam was performed according to the departmental dose-optimization program which includes automated exposure control, adjustment of the mA and/or kV according to patient size and/or use of iterative reconstruction technique. CONTRAST:  6m OMNIPAQUE IOHEXOL 350 MG/ML SOLN COMPARISON:  10/13/2021 FINDINGS: Lower chest: No acute pleural or parenchymal lung disease. Small hiatal hernia. Hepatobiliary: No focal liver abnormality is seen. Status post cholecystectomy. No biliary dilatation. Pancreas: Unremarkable. No pancreatic ductal dilatation or surrounding inflammatory changes. Spleen: Normal in size without focal abnormality. Adrenals/Urinary Tract: Adrenal glands are unremarkable. Kidneys are normal, without renal calculi, focal lesion, or hydronephrosis. Bladder is decompressed, limiting its evaluation. Stomach/Bowel: No bowel obstruction or ileus. The appendix is surgically  absent. No bowel wall thickening or inflammatory change. 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 Vascular/Lymphatic: Aortic atherosclerosis. No enlarged abdominal or pelvic lymph nodes. Reproductive: Status post hysterectomy. No adnexal masses. Other: No free fluid or free intraperitoneal gas. No abdominal wall hernia. Musculoskeletal: No acute or destructive bony lesions. Reconstructed images demonstrate no additional findings. IMPRESSION: 1. No acute intra-abdominal or intrapelvic process. 2. Small hiatal hernia. 3.  Aortic Atherosclerosis (ICD10-I70.0). Electronically Signed   By: MLegrand Como  Owens Shark M.D.   On: 08/21/2022 22:22   CT Head Wo Contrast  Result Date: 08/21/2022 CLINICAL DATA:  Mental status change. EXAM: CT HEAD WITHOUT CONTRAST TECHNIQUE: Contiguous axial images were obtained from the base of the skull through the vertex without intravenous contrast. RADIATION DOSE REDUCTION: This exam was performed according to the departmental dose-optimization program which includes automated exposure control, adjustment of the mA and/or kV according to patient size and/or use of iterative reconstruction technique. COMPARISON:  MRI brain 10/13/2021.  CT head 3 15 2011. FINDINGS: Brain: No evidence of acute infarction, hemorrhage, hydrocephalus, extra-axial collection or mass lesion/mass effect. Vascular: Atherosclerotic calcifications are present within the cavernous internal carotid arteries. Skull: Normal. Negative for fracture or focal lesion. Sinuses/Orbits: No acute finding. Other: None. IMPRESSION: No acute intracranial abnormality. Electronically Signed   By: Ronney Asters M.D.   On: 08/21/2022 22:18   DG Chest Port 1 View  Result Date: 08/21/2022 CLINICAL DATA:  Fever, diarrhea, questionable sepsis EXAM: PORTABLE CHEST 1 VIEW COMPARISON:  10/12/2021 FINDINGS: Single frontal view of the chest demonstrates an unremarkable cardiac silhouette. No acute airspace disease, effusion, or  pneumothorax. No acute bony abnormalities. IMPRESSION: 1. No acute intrathoracic process. Electronically Signed   By: Randa Ngo M.D.   On: 08/21/2022 19:35    Procedures .Critical Care  Performed by: Elgie Congo, MD Authorized by: Elgie Congo, MD   Critical care provider statement:    Critical care time (minutes):  35   Critical care was necessary to treat or prevent imminent or life-threatening deterioration of the following conditions:  Sepsis   Critical care was time spent personally by me on the following activities:  Development of treatment plan with patient or surrogate, discussions with consultants, evaluation of patient's response to treatment, examination of patient, ordering and review of laboratory studies, ordering and review of radiographic studies, ordering and performing treatments and interventions, pulse oximetry, re-evaluation of patient's condition, review of old charts and obtaining history from patient or surrogate   Care discussed with: admitting provider     Remain on constant cardiac monitoring, initial sinus tachycardia improved to normal sinus rhythm.  Medications Ordered in ED Medications  piperacillin-tazobactam (ZOSYN) IVPB 3.375 g (has no administration in time range)  acetaminophen (TYLENOL) tablet 1,000 mg (has no administration in time range)  lactated ringers bolus 1,000 mL (0 mLs Intravenous Stopped 08/21/22 2203)  ondansetron (ZOFRAN) injection 4 mg (4 mg Intravenous Given 08/21/22 1959)  potassium chloride SA (KLOR-CON M) CR tablet 40 mEq (40 mEq Oral Given 08/21/22 2202)  lactated ringers bolus 1,000 mL (1,000 mLs Intravenous New Bag/Given 08/21/22 2203)  iohexol (OMNIPAQUE) 350 MG/ML injection 75 mL (75 mLs Intravenous Contrast Given 08/21/22 2150)    ED Course/ Medical Decision Making/ A&P Clinical Course as of 08/21/22 2254  Thu Aug 21, 2022  2051 Labs remarkable for normal white blood cell count 4.7 mild thrombocytopenia 146.   New transaminitis AST 250 ALT 98.  Mild hypokalemia 3.1.  Ordered for p.o. repletion. [VB]  2252 Discussed case with hospitalist will be down to evaluate the patient put in orders for admission. [VB]    Clinical Course User Index [VB] Elgie Congo, MD                           Medical Decision Making LAURELIN ELSON is a 71 y.o. female. With pmh  diabetes, atrial fibrillation on Eliquis, GERD, hypertension who presents with generalized  fatigue and weakness in the setting of body aches, abdominal pain and multiple episodes of nonbloody diarrhea.  Patient symptoms likely secondary to suspected possible viral enteritis versus possible diverticulitis vs colitis vs biliary pathology (less likely h/o cholecystectomy)/hepatitis or UTI or other acute intra-abdominal pathology or possible COVID/influenza.  I have no concern for meningitis with normal mentation, no focal deficits and no meningismus on exam.  A chest x-ray was obtained which showed no evidence of pneumonia.  I have no concern for stroke with no localizing deficits.  I have no concern for atypical ACS with reassuring EKG with no acute ischemic changes and no complaints of chest pain or shortness of breath or other cardiopulmonary complaint.  Sepsis work-up was sent due to tachycardia and fever at home with infectious symptoms.  CT abdomen pelvis sent to further evaluate for acute intra-abdominal pathology contributing to symptoms such as diverticulitis.  UA sent to evaluate for UTI.  Ordered for GI pathogen panel and C. difficile PCR although less likely C. difficile with no recent antibiotic use.  Labs will be evaluated mainly for evidence of AKI or acute metabolic changes such as hypokalemia contributing to generalized fatigue and weakness.  She will be started on IV fluids and IV Zofran.  IV Zosyn ordered for intra-abdominal pathology after labs remarkable for lactate 2.6 and new transaminitis.  CT head personally reviewed, no ICH or  acute intracranial pathology.  CTAP showed no evidence of acute intra-abdominal pathology.  Her UA was very suggestive of UTI with moderate leukocyte esterase greater than 50 WBCs many bacteria and small hemoglobin.  With her elevated lactate and UTI and waxing and waning confusion with dehydration, will page hospitalist for admission for continued IV fluids and antibiotics.  Amount and/or Complexity of Data Reviewed Radiology: ordered.  Risk OTC drugs. Prescription drug management. Decision regarding hospitalization.   Final Clinical Impression(s) / ED Diagnoses Final diagnoses:  Diarrhea, unspecified type  Weakness  Transaminitis  Hypokalemia  Urinary tract infection without hematuria, site unspecified    Rx / DC Orders ED Discharge Orders     None         Elgie Congo, MD 08/21/22 2254    Elgie Congo, MD 08/21/22 [phone]

## 2022-08-21 NOTE — ED Notes (Signed)
Pt;s oxygen level dropped to 76% when she was sleeping. She reports she has sleep apnea. Pt placed on 3L [place] and O2 increased to 100%.

## 2022-08-21 NOTE — ED Notes (Addendum)
Pt reports her right arm is restricted. She did not inform this RN prior to IV insertion or medication administration. She stated she forgot to tell me. LR was infusing via right AC but was then stopped. 20g IV inserted to left forearm. Then the two IVs to her right arm were removed. LR restarted to left arm IV. Dr. Nechama Guard informed.

## 2022-08-21 NOTE — ED Notes (Signed)
Pt was only able to provide enough of a sample for only a urinalysis. Will attempt to send a urine culture down when she has to use the restroom again.

## 2022-08-21 NOTE — ED Notes (Signed)
Pt currently at CT.

## 2022-08-21 NOTE — ED Notes (Signed)
Pink restricted arm band bracelet applied to the patients right arm.

## 2022-08-22 DIAGNOSIS — Z853 Personal history of malignant neoplasm of breast: Secondary | ICD-10-CM | POA: Diagnosis not present

## 2022-08-22 DIAGNOSIS — Z823 Family history of stroke: Secondary | ICD-10-CM | POA: Diagnosis not present

## 2022-08-22 DIAGNOSIS — Z800 Family history of malignant neoplasm of digestive organs: Secondary | ICD-10-CM | POA: Diagnosis not present

## 2022-08-22 DIAGNOSIS — Z170 Estrogen receptor positive status [ER+]: Secondary | ICD-10-CM | POA: Diagnosis not present

## 2022-08-22 DIAGNOSIS — A419 Sepsis, unspecified organism: Secondary | ICD-10-CM | POA: Diagnosis present

## 2022-08-22 DIAGNOSIS — R531 Weakness: Secondary | ICD-10-CM | POA: Diagnosis not present

## 2022-08-22 DIAGNOSIS — E876 Hypokalemia: Secondary | ICD-10-CM | POA: Diagnosis present

## 2022-08-22 DIAGNOSIS — M810 Age-related osteoporosis without current pathological fracture: Secondary | ICD-10-CM | POA: Diagnosis present

## 2022-08-22 DIAGNOSIS — E119 Type 2 diabetes mellitus without complications: Secondary | ICD-10-CM | POA: Diagnosis present

## 2022-08-22 DIAGNOSIS — N390 Urinary tract infection, site not specified: Secondary | ICD-10-CM | POA: Diagnosis present

## 2022-08-22 DIAGNOSIS — I4819 Other persistent atrial fibrillation: Secondary | ICD-10-CM | POA: Diagnosis present

## 2022-08-22 DIAGNOSIS — C50411 Malignant neoplasm of upper-outer quadrant of right female breast: Secondary | ICD-10-CM | POA: Diagnosis not present

## 2022-08-22 DIAGNOSIS — G9349 Other encephalopathy: Secondary | ICD-10-CM | POA: Diagnosis present

## 2022-08-22 DIAGNOSIS — J45909 Unspecified asthma, uncomplicated: Secondary | ICD-10-CM | POA: Diagnosis present

## 2022-08-22 DIAGNOSIS — E669 Obesity, unspecified: Secondary | ICD-10-CM | POA: Diagnosis present

## 2022-08-22 DIAGNOSIS — M797 Fibromyalgia: Secondary | ICD-10-CM | POA: Diagnosis present

## 2022-08-22 DIAGNOSIS — Z6837 Body mass index (BMI) 37.0-37.9, adult: Secondary | ICD-10-CM | POA: Diagnosis not present

## 2022-08-22 DIAGNOSIS — Z1152 Encounter for screening for COVID-19: Secondary | ICD-10-CM | POA: Diagnosis not present

## 2022-08-22 DIAGNOSIS — R197 Diarrhea, unspecified: Secondary | ICD-10-CM | POA: Diagnosis not present

## 2022-08-22 DIAGNOSIS — E872 Acidosis, unspecified: Secondary | ICD-10-CM | POA: Diagnosis present

## 2022-08-22 DIAGNOSIS — A09 Infectious gastroenteritis and colitis, unspecified: Secondary | ICD-10-CM | POA: Diagnosis not present

## 2022-08-22 DIAGNOSIS — E860 Dehydration: Secondary | ICD-10-CM | POA: Diagnosis present

## 2022-08-22 DIAGNOSIS — A084 Viral intestinal infection, unspecified: Secondary | ICD-10-CM | POA: Diagnosis present

## 2022-08-22 DIAGNOSIS — E785 Hyperlipidemia, unspecified: Secondary | ICD-10-CM | POA: Diagnosis present

## 2022-08-22 DIAGNOSIS — G9341 Metabolic encephalopathy: Secondary | ICD-10-CM | POA: Diagnosis present

## 2022-08-22 DIAGNOSIS — Z8249 Family history of ischemic heart disease and other diseases of the circulatory system: Secondary | ICD-10-CM | POA: Diagnosis not present

## 2022-08-22 DIAGNOSIS — K529 Noninfective gastroenteritis and colitis, unspecified: Secondary | ICD-10-CM | POA: Diagnosis not present

## 2022-08-22 DIAGNOSIS — I10 Essential (primary) hypertension: Secondary | ICD-10-CM | POA: Diagnosis present

## 2022-08-22 LAB — BASIC METABOLIC PANEL
Anion gap: 9 (ref 5–15)
BUN: 9 mg/dL (ref 8–23)
CO2: 25 mmol/L (ref 22–32)
Calcium: 8.3 mg/dL — ABNORMAL LOW (ref 8.9–10.3)
Chloride: 106 mmol/L (ref 98–111)
Creatinine, Ser: 0.66 mg/dL (ref 0.44–1.00)
GFR, Estimated: >60 mL/min (ref >60)
Glucose, Bld: 136 mg/dL — ABNORMAL HIGH (ref 70–99)
Potassium: 3.3 mmol/L — ABNORMAL LOW (ref 3.5–5.1)
Sodium: 140 mmol/L (ref 135–145)

## 2022-08-22 LAB — MAGNESIUM: Magnesium: 1.4 mg/dL — ABNORMAL LOW (ref 1.7–2.4)

## 2022-08-22 MED ORDER — LACTATED RINGERS IV SOLN: INTRAVENOUS | Status: DC

## 2022-08-22 MED ORDER — POTASSIUM CHLORIDE CRYS ER 20 MEQ PO TBCR
40.0000 meq | EXTENDED_RELEASE_TABLET | Freq: Once | ORAL | Status: AC
  Administered 2022-08-22: 40 meq via ORAL
  Filled 2022-08-22: qty 2

## 2022-08-22 MED ORDER — MAGNESIUM SULFATE 50 % IJ SOLN
3.0000 g | Freq: Once | INTRAVENOUS | Status: AC
  Administered 2022-08-22: 3 g via INTRAVENOUS
  Filled 2022-08-22: qty 6

## 2022-08-22 MED ORDER — METOPROLOL SUCCINATE ER 25 MG PO TB24
25.0000 mg | ORAL_TABLET | Freq: Every day | ORAL | Status: DC
  Administered 2022-08-22 – 2022-08-23 (×2): 25 mg via ORAL
  Filled 2022-08-22 (×2): qty 1

## 2022-08-22 MED ORDER — FLUCONAZOLE 150 MG PO TABS
150.0000 mg | ORAL_TABLET | Freq: Once | ORAL | Status: AC
  Administered 2022-08-22: 150 mg via ORAL
  Filled 2022-08-22: qty 1

## 2022-08-22 NOTE — H&P (Signed)
History and Physical  Patient Name: Kayla Bennett     FXT:[id number]    DOB: Aug 02, 1951    DOA: 08/21/2022 PCP: Luetta Nutting, DO  Chief Complaint: diarrhea  HPI: Kayla Bennett is a 71 y.o. with history of breast cancer on tamoxifen, hyperlipidemia, hypertension, obstructive sleep apnea, A-fib on Eliquis presented to the emergency department due to acute onset diarrhea, body aches and abdominal discomfort.  Patient states that symptoms started 9/4 around 9 PM.  She continued to have up to 20 episodes of nonbloody diarrhea with associated nausea without any vomiting.  Family noted that patient was intermittently confused.  Due to persistent diarrhea dehydration and inability to tolerate p.o. intake she presented to the ER for further evaluation.  On arrival to the emergency department she was afebrile hemodynamically stable.  Labs were obtained which were notable for lactic acid 2.6, potassium 3.1, glucose under 30, AST 250, ALT 98, WBC 4.7, hemoglobin 12.2, platelets 146, INR 1.2, respiratory viral panel was negative.  Urinalysis was concerning for infection.  Patient underwent CT abdomen pelvis which demonstrated no acute findings.  CT head showed no acute abnormalities.  Chest x-ray showed no acute cardiopulmonary abnormalities.  Patient was admitted for further work-up.  On admission patient states that she is felt better with IV hydration and Zofran.  She has not had any more diarrheal bowel movements.  She has been unable to provide a GI pathogen panel sample.   ROS: Negative unless noted   Past Medical History:  Diagnosis Date   Allergy 1978   Food allergies, seasonal allergies, medicine allergies   Anemia    Arthritis    Asthma    Blood transfusion without reported diagnosis 1985   Breast cancer ([place])    Diabetes ([place])    Dyspnea    Dysrhythmia    afibb on occasion   Family history of adverse reaction to anesthesia    mother had reaction to versed...during surgery, stopped  breathing   Fibromyalgia    GERD (gastroesophageal reflux disease)    Headache    History of hiatal hernia    Hyperlipidemia 1990?   Hypertension    Migraines    Osteopenia    Osteoporosis    Oxygen deficiency 2023   Psoriasis    Sleep apnea 2023   Yeast infection 10/30/2021    Past Surgical History:  Procedure Laterality Date   ABDOMINAL HYSTERECTOMY     APPENDECTOMY     ATRIAL FIBRILLATION ABLATION N/A 12/10/2021   Procedure: ATRIAL FIBRILLATION ABLATION;  Surgeon: Constance Haw, MD;  Location: [facility];  Service: Cardiovascular;  Laterality: N/A;   BREAST LUMPECTOMY WITH RADIOACTIVE SEED AND SENTINEL LYMPH NODE BIOPSY Right 02/18/2021   Procedure: RIGHT BREAST LUMPECTOMY WITH RADIOACTIVE SEED AND SENTINEL LYMPH NODE BIOPSY;  Surgeon: [name], MD;  Location: [facility];  Service: General;  Laterality: Right;   BREAST SURGERY  2022   broken wrist     CARDIOVERSION N/A 04/04/2021   Procedure: CARDIOVERSION;  Surgeon: Sueanne Margarita, MD;  Location: [facility];  Service: Cardiovascular;  Laterality: N/A;   CARDIOVERSION N/A 08/14/2021   Procedure: CARDIOVERSION;  Surgeon: Geralynn Rile, MD;  Location: [facility];  Service: Cardiovascular;  Laterality: N/A;   CARDIOVERSION N/A 09/19/2021   Procedure: CARDIOVERSION;  Surgeon: Geralynn Rile, MD;  Location: [facility];  Service: Cardiovascular;  Laterality: N/A;   [place]  right wrist   HYSTERECTOMY ABDOMINAL WITH SALPINGO-OOPHORECTOMY     LYSIS OF ADHESION     SHOULDER SURGERY Left    TUBAL LIGATION      Social History:   Allergies  Allergen Reactions   Lotensin [Benazepril Hcl] Swelling   Morphine Itching and Rash   Erythromycin Base Other (See Comments)    Unknown reaction   Hydrocodone Other (See Comments)    Unknown reaction     Other Swelling    Mustard - vaginal swelling/pain    Banana Swelling     vaginal swelling/pain   Chocolate Swelling    vaginal swelling/pain   Gluten Meal Diarrhea    Upset stomach    Tizanidine Other (See Comments)    Lowered blood pressure    Wound Dressing Adhesive Other (See Comments)    Skin burns, blisters     Family history: family history includes Arthritis in her maternal aunt and mother; Atrial fibrillation in her cousin and mother; Cancer in her father, maternal uncle, mother, paternal uncle, and paternal uncle; Colon cancer in her cousin and paternal uncle; Colon cancer (age of onset: 36) in her father; Diabetes in her maternal aunt, maternal aunt, maternal aunt, maternal uncle, mother, paternal aunt, paternal uncle, paternal uncle, paternal uncle, paternal uncle, and sister; Early death in her father; Hearing loss in her maternal aunt, maternal uncle, paternal aunt, paternal uncle, and paternal uncle; Heart attack (age of onset: 60) in her father; Heart attack (age of onset: 77) in her paternal grandmother; Heart attack (age of onset: 57) in her mother; Heart disease in her father, maternal aunt, mother, paternal uncle, paternal uncle, and paternal uncle; Hyperlipidemia in her sister; Hypertension in her father, mother, paternal uncle, and sister; Obesity in her paternal uncle and paternal uncle; Stroke in her maternal grandmother, maternal uncle, and paternal grandfather.  Prior to Admission medications   Medication Sig Start Date End Date Taking? Authorizing Provider  acetaminophen (TYLENOL) 500 MG tablet Take 500 mg by mouth every 6 (six) hours as needed for moderate pain, headache or mild pain.    [provider]  albuterol (VENTOLIN HFA) 108 (90 Base) MCG/ACT inhaler Inhale 1-2 puffs into the lungs every 6 (six) hours as needed for shortness of breath or wheezing (Asthma).    [provider]  apixaban (ELIQUIS) 5 MG TABS tablet Take 1 tablet (5 mg total) by mouth 2 (two) times daily. Pt needs 90 day supply.  Please keep on file till  next refill. 07/25/22 07/25/23  Camnitz, Ocie Doyne, MD  calcipotriene (DOVONOX) 0.005 % cream Apply 1 application topically See admin instructions. Mix with fluorouracil 5% cream and apply topically twice daily for five days - as needed for precancerous on right cheek.    [provider]  cetirizine (ZYRTEC) 10 MG tablet Take 10 mg by mouth in the morning.    [provider]  cholecalciferol (VITAMIN D3) 25 MCG (1000 UNIT) tablet Take 1,000-2,000 Units by mouth See admin instructions. Take 1000 units in the morning and 2000 units at bedtime    [provider]  ferrous sulfate 325 (65 FE) MG tablet Take 325 mg by mouth every morning.    [provider]  fluconazole (DIFLUCAN) 150 MG tablet Take 1 tablet by mouth every 72 hours as needed. 11/14/21   Golden Circle, FNP  fluorouracil (EFUDEX) 5 % cream Apply 1 application topically 2 (two) times daily as needed (precancerous spots on right cheek). Mix with calcipotriene 0.005% cream  and apply topically twice daily for five days - as needed for precancerous on right cheek.    [provider]  gabapentin (NEURONTIN) 100 MG capsule Take 200 mg by mouth at bedtime.    [provider]  losartan-hydrochlorothiazide (HYZAAR) 100-25 MG tablet Take 1 tablet by mouth daily. 05/13/22   Fenton, Clint R, PA  metFORMIN (GLUCOPHAGE-XR) 500 MG 24 hr tablet TAKE ONE TABLET BY MOUTH EVERY DAY 08/14/22   Luetta Nutting, DO  metoprolol succinate (TOPROL-XL) 50 MG 24 hr tablet Take 1 tablet (50 mg total) by mouth daily. Take with or immediately following a meal. 04/22/22   Freada Bergeron, MD  nystatin cream (MYCOSTATIN) in the morning and at bedtime. 03/24/22   [provider]  pantoprazole (PROTONIX) 40 MG tablet Take 40 mg by mouth 2 (two) times daily before a meal.    [provider]  potassium chloride SA (KLOR-CON M20) 20 MEQ tablet Take 1 tablet (20 mEq total) by mouth daily. 02/25/22   Fenton, Clint  R, PA  Prenatal Vit-Fe Fumarate-FA (PRENATAL VITAMIN PLUS LOW IRON PO) Take 1 tablet by mouth in the morning.    [provider]  SUMAtriptan (IMITREX) 100 MG tablet Take 100 mg by mouth See admin instructions. Take one tablet (100 mg) by mouth at onset of migraine headache, may repeat in 2 hours if headache persists or recurs.    [provider]  tamoxifen (NOLVADEX) 20 MG tablet Take 1 tablet (20 mg total) by mouth every morning. 08/21/22   Truitt Merle, MD  triamcinolone (KENALOG) 0.1 % Apply 1 application topically 2 (two) times daily as needed (psoriasis). Mixed with Cetaphil 1:1    [provider]  venlafaxine XR (EFFEXOR-XR) 37.5 MG 24 hr capsule Take 37.5 mg by mouth at bedtime. Take 1 capsule (75 mg) + 1 capsule (37.5 mg) to equal 112.5 mg at bedtime 02/07/21   [provider]  venlafaxine XR (EFFEXOR-XR) 75 MG 24 hr capsule Take 75 mg by mouth at bedtime. Take 1 capsule (75 mg) + 1 capsule (37.5 mg) to equal 112.5 mg  at bedtime 09/29/20   [provider]  vitamin B-12 (CYANOCOBALAMIN) 1000 MCG tablet Take 1,000 mcg by mouth every morning.    [provider]  vitamin C (ASCORBIC ACID) 500 MG tablet Take 500 mg by mouth daily.    [provider]       Physical Exam: BP (!) 147/70   Pulse 95   Temp 98.1 F (36.7 C) (Oral)   Resp 19   Ht '5\' 1"'$  (1.549 m)   Wt 90.3 kg   SpO2 99%   BMI 37.60 kg/m  General appearance: Not in acute distress Eyes: Anicteric, conjunctiva pink, lids and lashes normal. PERRL.    ENT: No nasal deformity, discharge, epistaxis.  Lymph: No cervical or supraclavicular lymphadenopathy. Skin: Warm and dry.  No jaundice.  No suspicious rashes or lesions. Cardiac: RRR, nl S1-S2, no murmurs appreciated. No edema Respiratory: Normal respiratory rate and rhythm. Abdomen: Abdomen soft, nontender.  MSK: No deformities or effusions of the large joints of the upper or lower extremities bilaterally.  Neuro: No  focal neurologic deficits.  Psych: Normal mood and affect     Labs on Admission:  I have personally reviewed following labs and imaging studies: CBC: Recent Labs  Lab 08/21/22 1945  WBC 4.7  NEUTROABS 3.5  HGB 12.2  HCT 36.3  MCV 95.3  PLT 696*   Basic Metabolic Panel: Recent  Labs  Lab 08/21/22 1945  NA 138  K 3.1*  CL 100  CO2 24  GLUCOSE 134*  BUN 15  CREATININE 0.88  CALCIUM 9.2   GFR: Estimated Creatinine Clearance: 60 mL/min (by C-G formula based on SCr of 0.88 mg/dL).  Liver Function Tests: Recent Labs  Lab 08/21/22 1945  AST 250*  ALT 98*  ALKPHOS 40  BILITOT 0.6  PROT 6.5  ALBUMIN 3.5   No results for input(s): "LIPASE", "AMYLASE" in the last 168 hours. No results for input(s): "AMMONIA" in the last 168 hours. Coagulation Profile: Recent Labs  Lab 08/21/22 1945  INR 1.2   Cardiac Enzymes: No results for input(s): "CKTOTAL", "CKMB", "CKMBINDEX", "TROPONINI" in the last 168 hours. BNP (last 3 results) No results for input(s): "PROBNP" in the last 8760 hours.  Radiological Exams on Admission:  CT ABDOMEN PELVIS W CONTRAST  Result Date: 08/21/2022 CLINICAL DATA:  Fever, diarrhea, pain EXAM: CT ABDOMEN AND PELVIS WITH CONTRAST TECHNIQUE: Multidetector CT imaging of the abdomen and pelvis was performed using the standard protocol following bolus administration of intravenous contrast. RADIATION DOSE REDUCTION: This exam was performed according to the departmental dose-optimization program which includes automated exposure control, adjustment of the mA and/or kV according to patient size and/or use of iterative reconstruction technique. CONTRAST:  83m OMNIPAQUE IOHEXOL 350 MG/ML SOLN COMPARISON:  10/13/2021 FINDINGS: Lower chest: No acute pleural or parenchymal lung disease. Small hiatal hernia. Hepatobiliary: No focal liver abnormality is seen. Status post cholecystectomy. No biliary dilatation. Pancreas: Unremarkable. No pancreatic ductal dilatation  or surrounding inflammatory changes. Spleen: Normal in size without focal abnormality. Adrenals/Urinary Tract: Adrenal glands are unremarkable. Kidneys are normal, without renal calculi, focal lesion, or hydronephrosis. Bladder is decompressed, limiting its evaluation. Stomach/Bowel: No bowel obstruction or ileus. The appendix is surgically absent. No bowel wall thickening or inflammatory change. 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 Vascular/Lymphatic: Aortic atherosclerosis. No enlarged abdominal or pelvic lymph nodes. Reproductive: Status post hysterectomy. No adnexal masses. Other: No free fluid or free intraperitoneal gas. No abdominal wall hernia. Musculoskeletal: No acute or destructive bony lesions. Reconstructed images demonstrate no additional findings. IMPRESSION: 1. No acute intra-abdominal or intrapelvic process. 2. Small hiatal hernia. 3.  Aortic Atherosclerosis (ICD10-I70.0). Electronically Signed   By: MRanda NgoM.D.   On: 08/21/2022 22:22   CT Head Wo Contrast  Result Date: 08/21/2022 CLINICAL DATA:  Mental status change. EXAM: CT HEAD WITHOUT CONTRAST TECHNIQUE: Contiguous axial images were obtained from the base of the skull through the vertex without intravenous contrast. RADIATION DOSE REDUCTION: This exam was performed according to the departmental dose-optimization program which includes automated exposure control, adjustment of the mA and/or kV according to patient size and/or use of iterative reconstruction technique. COMPARISON:  MRI brain 10/13/2021.  CT head 3 15 2011. FINDINGS: Brain: No evidence of acute infarction, hemorrhage, hydrocephalus, extra-axial collection or mass lesion/mass effect. Vascular: Atherosclerotic calcifications are present within the cavernous internal carotid arteries. Skull: Normal. Negative for fracture or focal lesion. Sinuses/Orbits: No acute finding. Other: None. IMPRESSION: No acute intracranial abnormality. Electronically Signed    By: ARonney AstersM.D.   On: 08/21/2022 22:18   DG Chest Port 1 View  Result Date: 08/21/2022 CLINICAL DATA:  Fever, diarrhea, questionable sepsis EXAM: PORTABLE CHEST 1 VIEW COMPARISON:  10/12/2021 FINDINGS: Single frontal view of the chest demonstrates an unremarkable cardiac silhouette. No acute airspace disease, effusion, or pneumothorax. No acute bony abnormalities. IMPRESSION: 1. No acute intrathoracic process. Electronically Signed  By: Randa Ngo M.D.   On: 08/21/2022 19:35     Assessment/Plan  Ms.Sermon is a 71 year old with a history of breast cancer on tamoxifen who presented with acute diarrhea, body sweats consistent mostly with viral gastroenteritis.  Patient's respiratory panel was negative and she does not have respiratory symptoms.  The diarrhea appears to already be self resolving.  He is unable to provide a sample to test for viral illnesses however given acuity of her symptoms no further work-up is needed.  Urinalysis did show evidence of UTI though patient was also having symptoms.  #Acute infectious encephalopathy most likely due to viral gastroenteritis, POA, active #Acute diarrhea most likely due to viral gastroenteritis, POA, active #Hypokalemia secondary to diarrhea, POA, active -24 hours of profuse diarrhea causing dehydration and electrolyte derangements - Patient confused on presentation - Reports fever at home Plan: Supportive care with IV fluids and Zofran Obtain GI pathogen panel if possible Advance diet as tolerated IV ceftriaxone for UTI  #Paroxysmal A-fib-continue Eliquis  #GERD-continue Protonix #History of breast cancer-continue tamoxifen #Depression-continue Effexor #Chronic pain-continue gabapentin  DVT prophylaxis: eliwuis  Code Status: full  Disposition Plan: Anticipate DC tomrro Consults called: na Admission status: obs   At the point of initial evaluation, it is my clinical opinion that admission for OBSERVATIONis reasonable and  necessary because the patient's presenting complaints in the context of their chronic conditions represent sufficient risk of deterioration or significant morbidity to constitute reasonable grounds for close observation in the hospital setting, but that the patient may be medically stable for discharge from the hospital within 24 to 48 hours.    Medical decision making: Patient seen at 4:30 AM on 08/22/2022. What exists of the patient's chart was reviewed in depth and summarized above.    Emilee Hero [facility] Please page though [facility] or Epic secure chat:  For password, contact charge nurse

## 2022-08-22 NOTE — Progress Notes (Signed)
[facility]                                                                              Kayla Bennett, is a 71 y.o. female, DOB - May 19, 1951, BSW:[id number] Admit date - 08/21/2022    Outpatient Primary MD for the patient is Luetta Nutting, DO  LOS - 0  days  Chief Complaint  Patient presents with   Fever   Diarrhea       Brief summary   Patient is 71yo female with history of breast cancer on tamoxifen, HTN, HLP, OSA,  A-fib on Eliquis presented with acute onset of diarrhea, body aches and abdominal pain.    Patient states that symptoms started night before the admission, 10 episodes, brown watery with fevers, 100.5 F.  Also reported nausea, no vomiting but family noted intermittently confused.  Unable to hold anything p.o. In ED, lactic acid 2.6, potassium 3.1 COVID-19 negative. UA positive for UTI.  CT head showed no acute abnormalities.  Chest x-ray negative.  Patient was admitted for further work-up.    Assessment & Plan    Principal Problem: Sepsis secondary to acute gastroenteritis with nausea, diarrhea, fevers -Met sepsis criteria on admission with fevers, tachycardia, lactic acidosis, source likely due to UTI, gastroenteritis -Continue IV fluid hydration, per patient, diarrhea now improving -If diarrhea reoccurs, obtain GI pathogen panel, C. difficile -Diet advanced to soft solids  Active Problems:   UTI (urinary tract infection) -Follow culture and sensitivities, placed on IV Rocephin  Mild acute metabolic encephalopathy - likely due to #1  - improving      Malignant neoplasm of upper-outer quadrant of right breast in female, estrogen receptor positive (HCC) -Continue tamoxifen  Paroxysmal atrial fibrillation (HCC) -rate controlled, resume Toprol XL $RemoveB'25mg'EjBOIsbB$  daily   -Continue eliquis    Generalized weakness -PT OT consult, continue IV fluid hydration    Hypertension - BP stable, hold losartan-HCTZ      Hypokalemia -replaced      Hypomagnesemia - replaced IV   Obesity  Estimated body mass index is 37.6 kg/m as calculated from the following:   Height as of this encounter: $RemoveBeforeD'5\' 1"'MdskJeyanfWNKw$  (1.549 m).   Weight as of this encounter: 90.3 kg.  Code Status: full  DVT Prophylaxis:  SCDs Start: 08/21/22 2258 apixaban (ELIQUIS) tablet 5 mg   Level of Care: Level of care: Telemetry Medical Family Communication: Updated patient   Disposition Plan:      Remains inpatient appropriate:  work up in progress    Procedures:  None   Consultants:   None   Antimicrobials:   Anti-infectives (From admission, onward)    Start     Dose/Rate Route Frequency Ordered Stop   08/21/22 2300  cefTRIAXone (ROCEPHIN) 1 g in sodium chloride 0.9 % 100 mL IVPB        1 g 200 mL/hr over 30 Minutes Intravenous Every 24 hours 08/21/22 2259 08/26/22 2259   08/21/22 2115  piperacillin-tazobactam (ZOSYN) IVPB 3.375 g  Status:  Discontinued        3.375 g 12.5 mL/hr over 240 Minutes Intravenous  Once 08/21/22 2102 08/21/22 2259  Medications  apixaban  5 mg Oral BID   gabapentin  200 mg Oral QHS   pantoprazole  40 mg Oral BID AC   tamoxifen  20 mg Oral q morning   venlafaxine XR  112.5 mg Oral Q breakfast      Subjective:   Kayla Bennett was seen and examined today.  States diarrhea is improving. + generalized weakness.  Patient denies dizziness, chest pain, shortness of breath, abdominal pain. No acute events overnight.    Objective:   Vitals:   08/22/22 0829 08/22/22 0840 08/22/22 0900 08/22/22 1000  BP:  110/61 122/64 118/65  Pulse:  84 73 68  Resp:  $Remo'17 12 14  'eohTP$ Temp: 97.7 F (36.5 C)     TempSrc: Axillary     SpO2:  98% 97% 95%  Weight:      Height:        Intake/Output Summary (Last 24 hours) at 08/22/2022 1030 Last data filed at 08/21/2022 2302 Gross per 24 hour  Intake 2000 ml  Output --  Net 2000 ml     Wt Readings from Last 3 Encounters:  08/21/22 90.3 kg  07/24/22 90.6 kg  06/25/22 90 kg      Exam General: Alert and oriented x 3, NAD Cardiovascular: S1 S2 auscultated,  RRR Respiratory: Clear to auscultation bilaterally, no wheezing Gastrointestinal: Soft, nontender, nondistended, + bowel sounds Ext: no pedal edema bilaterally Neuro: no new deficits  Psych: Normal affect and demeanor    Data Reviewed:  I have personally reviewed following labs    CBC Lab Results  Component Value Date   WBC 4.7 08/21/2022   RBC 3.81 (L) 08/21/2022   HGB 12.2 08/21/2022   HCT 36.3 08/21/2022   MCV 95.3 08/21/2022   MCH 32.0 08/21/2022   PLT 146 (L) 08/21/2022   MCHC 33.6 08/21/2022   RDW 13.2 08/21/2022   LYMPHSABS 0.8 08/21/2022   MONOABS 0.4 08/21/2022   EOSABS 0.0 08/21/2022   BASOSABS 0.0 32/35/5732     Last metabolic panel Lab Results  Component Value Date   NA 140 08/22/2022   K 3.3 (L) 08/22/2022   CL 106 08/22/2022   CO2 25 08/22/2022   BUN 9 08/22/2022   CREATININE 0.66 08/22/2022   GLUCOSE 136 (H) 08/22/2022   GFRNONAA >60 08/22/2022   CALCIUM 8.3 (L) 08/22/2022   PHOS 4.4 10/16/2021   PROT 6.5 08/21/2022   ALBUMIN 3.5 08/21/2022   BILITOT 0.6 08/21/2022   ALKPHOS 40 08/21/2022   AST 250 (H) 08/21/2022   ALT 98 (H) 08/21/2022   ANIONGAP 9 08/22/2022    CBG (last 3)  No results for input(s): "GLUCAP" in the last 72 hours.    Coagulation Profile: Recent Labs  Lab 08/21/22 1945  INR 1.2     Radiology Studies: I have personally reviewed the imaging studies  CT ABDOMEN PELVIS W CONTRAST  Result Date: 08/21/2022 CLINICAL DATA:  Fever, diarrhea, pain EXAM: CT ABDOMEN AND PELVIS WITH CONTRAST TECHNIQUE: Multidetector CT imaging of the abdomen and pelvis was performed using the standard protocol following bolus administration of intravenous contrast. RADIATION DOSE REDUCTION: This exam was performed according to the departmental dose-optimization program which includes automated exposure control, adjustment of the mA and/or kV according to  patient size and/or use of iterative reconstruction technique. CONTRAST:  51mL OMNIPAQUE IOHEXOL 350 MG/ML SOLN COMPARISON:  10/13/2021 FINDINGS: Lower chest: No acute pleural or parenchymal lung disease. Small hiatal hernia. Hepatobiliary: No focal liver abnormality is seen. Status post  cholecystectomy. No biliary dilatation. Pancreas: Unremarkable. No pancreatic ductal dilatation or surrounding inflammatory changes. Spleen: Normal in size without focal abnormality. Adrenals/Urinary Tract: Adrenal glands are unremarkable. Kidneys are normal, without renal calculi, focal lesion, or hydronephrosis. Bladder is decompressed, limiting its evaluation. Stomach/Bowel: No bowel obstruction or ileus. The appendix is surgically absent. No bowel wall thickening or inflammatory change. 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 Vascular/Lymphatic: Aortic atherosclerosis. No enlarged abdominal or pelvic lymph nodes. Reproductive: Status post hysterectomy. No adnexal masses. Other: No free fluid or free intraperitoneal gas. No abdominal wall hernia. Musculoskeletal: No acute or destructive bony lesions. Reconstructed images demonstrate no additional findings. IMPRESSION: 1. No acute intra-abdominal or intrapelvic process. 2. Small hiatal hernia. 3.  Aortic Atherosclerosis (ICD10-I70.0). Electronically Signed   By: Randa Ngo M.D.   On: 08/21/2022 22:22   CT Head Wo Contrast  Result Date: 08/21/2022 CLINICAL DATA:  Mental status change. EXAM: CT HEAD WITHOUT CONTRAST TECHNIQUE: Contiguous axial images were obtained from the base of the skull through the vertex without intravenous contrast. RADIATION DOSE REDUCTION: This exam was performed according to the departmental dose-optimization program which includes automated exposure control, adjustment of the mA and/or kV according to patient size and/or use of iterative reconstruction technique. COMPARISON:  MRI brain 10/13/2021.  CT head 3 15 2011. FINDINGS: Brain:  No evidence of acute infarction, hemorrhage, hydrocephalus, extra-axial collection or mass lesion/mass effect. Vascular: Atherosclerotic calcifications are present within the cavernous internal carotid arteries. Skull: Normal. Negative for fracture or focal lesion. Sinuses/Orbits: No acute finding. Other: None. IMPRESSION: No acute intracranial abnormality. Electronically Signed   By: Ronney Asters M.D.   On: 08/21/2022 22:18   DG Chest Port 1 View  Result Date: 08/21/2022 CLINICAL DATA:  Fever, diarrhea, questionable sepsis EXAM: PORTABLE CHEST 1 VIEW COMPARISON:  10/12/2021 FINDINGS: Single frontal view of the chest demonstrates an unremarkable cardiac silhouette. No acute airspace disease, effusion, or pneumothorax. No acute bony abnormalities. IMPRESSION: 1. No acute intrathoracic process. Electronically Signed   By: Randa Ngo M.D.   On: 08/21/2022 19:35       Jenifer Struve M.D. [facility] 08/22/2022, 10:30 AM  Available via Epic secure chat 7am-7pm After 7 pm, please refer to night coverage provider listed on amion.

## 2022-08-22 NOTE — Progress Notes (Signed)
Patient placed on BiPAP for the night with 2 L O2 bled in.

## 2022-08-22 NOTE — Progress Notes (Signed)
08/22/22 1226  PT Visit Information  Last PT Received On 08/22/22  Assistance Needed +1  History of Present Illness Pt is a 71 y/o female admitted secondary to diarrhea. Thought to be secondary to gastroenteritis. PMH includes breast CA, a fib, and HTN.  Precautions  Precautions Fall  Restrictions  Weight Bearing Restrictions No  Home Living  Family/patient expects to be discharged to: Private residence  Living Arrangements Spouse/significant other  Available Help at Discharge Family  Type of [place] Access Level entry  [place] One level  Bathroom Shower/Tub Walk-in shower  [place] - single [place] (2 wheels);Shower seat;BSC/3in1  Prior Function  Prior Level of Function  Independent/Modified Independent  Mobility Comments Independent  Communication  Communication No difficulties  Pain Assessment  Pain Assessment No/denies pain  Cognition  Arousal/Alertness Awake/alert  Behavior During Therapy WFL for tasks assessed/performed  Overall Cognitive Status Within Functional Limits for tasks assessed  Upper Extremity Assessment  Upper Extremity Assessment Defer to OT evaluation  Lower Extremity Assessment  Lower Extremity Assessment Generalized weakness (bialteral thigh pain)  Cervical / Trunk Assessment  Cervical / Trunk Assessment Kyphotic  Bed Mobility  Overal bed mobility Needs Assistance  Bed Mobility Supine to Sit;Sit to Supine  Supine to sit Min assist  Sit to supine Min assist  General bed mobility comments Assist for trunk and LE assist  Transfers  Overall transfer level Needs assistance  Equipment used 1 person hand held assist  Transfers Sit to/from Stand  Sit to Stand Min guard  General transfer comment Min guard for safety  Ambulation/Gait  Ambulation/Gait assistance Min assist;Min guard  Gait Distance (Feet) 40 Feet  Assistive device 1 person hand held assist  Gait  Pattern/deviations Step-through pattern;Decreased stride length  General Gait Details Very slow, guarded gait. Bilateral thigh pain which limited mobility tolerance. Required multiple standing rests. Discussed using RW for mobility.  Gait velocity Decreased  Balance  Overall balance assessment Needs assistance  Sitting-balance support No upper extremity supported;Feet supported  Sitting balance-Leahy Scale Good  Standing balance support Single extremity supported  Standing balance-Leahy Scale Fair  PT - End of Session  Activity Tolerance Patient limited by pain  Patient left in bed;with call bell/phone within reach;with family/visitor present (on stretcher in ED)  Nurse Communication Mobility status  PT Assessment  PT Recommendation/Assessment Patient needs continued PT services  PT Visit Diagnosis Unsteadiness on feet (R26.81);Muscle weakness (generalized) (M62.81);Pain  Pain - Right/Left  (bilateral)  Pain - part of body  (thigh)  PT Problem List Decreased strength;Decreased activity tolerance;Decreased balance;Decreased mobility;Pain  PT Plan  PT Frequency (ACUTE ONLY) Min 3X/week  PT Treatment/Interventions (ACUTE ONLY) DME instruction;Functional mobility training;Therapeutic activities;Therapeutic exercise;Gait training;Balance training;Patient/family education  AM-PAC PT "6 Clicks" Mobility Outcome Measure (Version 2)  Help needed turning from your back to your side while in a flat bed without using bedrails? 3  Help needed moving from lying on your back to sitting on the side of a flat bed without using bedrails? 3  Help needed moving to and from a bed to a chair (including a wheelchair)? 3  Help needed standing up from a chair using your arms (e.g., wheelchair or bedside chair)? 3  Help needed to walk in hospital room? 3  Help needed climbing 3-5 steps with a railing?  2  6 Click Score 17  Consider Recommendation of Discharge To: Home with [facility]  Progressive Mobility  What is  the  highest level of mobility based on the progressive mobility assessment? Level 5 (Walks with assist in room/hall) - Balance while stepping forward/back and can walk in room with assist - Complete  Mobility Referral Yes  Activity Ambulated with assistance in hallway  PT Recommendation  Follow Up Recommendations Outpatient PT (prefers [facility])  Assistance recommended at discharge Intermittent Supervision/Assistance  Patient can return home with the following A little help with walking and/or transfers;A little help with bathing/dressing/bathroom;Assistance with cooking/housework;Assist for transportation;Help with stairs or ramp for entrance  Functional Status Assessment Patient has had a recent decline in their functional status and demonstrates the ability to make significant improvements in function in a reasonable and predictable amount of time.  PT equipment None recommended by PT  Individuals Consulted  Consulted and Agree with Results and Recommendations Patient  Acute Rehab PT Goals  Patient Stated Goal to go home  PT Goal Formulation With patient  Time For Goal Achievement 09/05/22  Potential to Achieve Goals Good  PT Time Calculation  PT Start Time (ACUTE ONLY) 1224  PT Stop Time (ACUTE ONLY) 1240  PT Time Calculation (min) (ACUTE ONLY) 16 min  PT General Charges  $$ ACUTE PT VISIT 1 Visit  PT Evaluation  $PT Eval Moderate Complexity 1 Mod   Pt admitted secondary to problem above with deficits above. Pt requiring min guard to min A for mobility tasks. Reporting increased bilateral thigh pain which limited tolerance. Educated about using RW at home to increase safety. Recommend outpatient PT to address deficits; pt requests [facility]. Will continue to follow acutely.   Reuel Derby, PT, DPT  Acute Rehabilitation Services  Office: [phone]

## 2022-08-23 DIAGNOSIS — R197 Diarrhea, unspecified: Secondary | ICD-10-CM | POA: Diagnosis not present

## 2022-08-23 DIAGNOSIS — I4819 Other persistent atrial fibrillation: Secondary | ICD-10-CM

## 2022-08-23 DIAGNOSIS — C50411 Malignant neoplasm of upper-outer quadrant of right female breast: Secondary | ICD-10-CM

## 2022-08-23 DIAGNOSIS — K529 Noninfective gastroenteritis and colitis, unspecified: Secondary | ICD-10-CM | POA: Diagnosis not present

## 2022-08-23 DIAGNOSIS — R531 Weakness: Secondary | ICD-10-CM | POA: Diagnosis not present

## 2022-08-23 DIAGNOSIS — A09 Infectious gastroenteritis and colitis, unspecified: Secondary | ICD-10-CM

## 2022-08-23 DIAGNOSIS — Z170 Estrogen receptor positive status [ER+]: Secondary | ICD-10-CM

## 2022-08-23 DIAGNOSIS — I10 Essential (primary) hypertension: Secondary | ICD-10-CM

## 2022-08-23 DIAGNOSIS — E876 Hypokalemia: Secondary | ICD-10-CM

## 2022-08-23 LAB — BASIC METABOLIC PANEL
Anion gap: 9 (ref 5–15)
BUN: 6 mg/dL — ABNORMAL LOW (ref 8–23)
CO2: 23 mmol/L (ref 22–32)
Calcium: 8 mg/dL — ABNORMAL LOW (ref 8.9–10.3)
Chloride: 106 mmol/L (ref 98–111)
Creatinine, Ser: 0.64 mg/dL (ref 0.44–1.00)
GFR, Estimated: >60 mL/min (ref >60)
Glucose, Bld: 133 mg/dL — ABNORMAL HIGH (ref 70–99)
Potassium: 4 mmol/L (ref 3.5–5.1)
Sodium: 138 mmol/L (ref 135–145)

## 2022-08-23 LAB — URINE CULTURE

## 2022-08-23 MED ORDER — DICYCLOMINE HCL 10 MG PO CAPS: 10.0000 mg | ORAL_CAPSULE | Freq: Three times a day (TID) | ORAL | 0 refills | Status: DC | PRN

## 2022-08-23 MED ORDER — CEPHALEXIN 500 MG PO CAPS: 500.0000 mg | ORAL_CAPSULE | Freq: Two times a day (BID) | ORAL | Status: DC

## 2022-08-23 MED ORDER — LOPERAMIDE HCL 2 MG PO TABS: 2.0000 mg | ORAL_TABLET | Freq: Three times a day (TID) | ORAL | 0 refills | Status: DC | PRN

## 2022-08-23 MED ORDER — ONDANSETRON HCL 4 MG PO TABS: 4.0000 mg | ORAL_TABLET | Freq: Three times a day (TID) | ORAL | 0 refills | Status: DC | PRN

## 2022-08-23 MED ORDER — UNABLE TO FIND: 0 refills | Status: AC

## 2022-08-23 MED ORDER — UNABLE TO FIND: 0 refills | Status: DC

## 2022-08-23 MED ORDER — CEPHALEXIN 500 MG PO CAPS: 500.0000 mg | ORAL_CAPSULE | Freq: Two times a day (BID) | ORAL | 0 refills | Status: AC

## 2022-08-23 MED ORDER — LOSARTAN POTASSIUM-HCTZ 100-25 MG PO TABS: 1.0000 | ORAL_TABLET | Freq: Every day | ORAL | 6 refills | Status: DC

## 2022-08-23 NOTE — Plan of Care (Signed)
  Problem: Nutrition: Goal: Adequate nutrition will be maintained Outcome: Progressing   Problem: Coping: Goal: Level of anxiety will decrease Outcome: Progressing   Problem: Pain Managment: Goal: General experience of comfort will improve Outcome: Progressing   

## 2022-08-23 NOTE — Progress Notes (Signed)
Patient's husband at bedside.  Discharge instructions given to patient.  Patient verbalized understanding.  Needs addressed.  Discharged home.

## 2022-08-23 NOTE — Discharge Summary (Signed)
Physician Discharge Summary   Patient: Kayla Bennett MRN: [id number] DOB: September 20, 1951  Admit date:     08/21/2022  Discharge date: 08/23/22  Discharge Physician: Estill Cotta, MD    PCP: Luetta Nutting, DO   Recommendations at discharge:   Continue Keflex 500 mg p.o. twice daily for 1 more day to complete full course for the uncomplicated UTI Diarrhea has resolved, patient recommended Imodium as needed  Discharge Diagnoses:    Gastroenteritis   Malignant neoplasm of upper-outer quadrant of right breast in female, estrogen receptor positive ([place])   Persistent atrial fibrillation ([place])   Generalized weakness   Hypertension   Sepsis ([place]) secondary to UTI, gastroenteritis   Diarrhea-resolved   UTI (urinary tract infection)   Hypokalemia   Hypomagnesemia  Hospital Course: Patient is 71yo female with history of breast cancer on tamoxifen, HTN, HLP, OSA,  A-fib on Eliquis presented with acute onset of diarrhea, body aches and abdominal pain.    Patient states that symptoms started night before the admission, 10 episodes, brown watery with fevers, 100.5 F.  Also reported nausea, no vomiting but family noted intermittently confused.  Unable to hold anything p.o. In ED, lactic acid 2.6, potassium 3.1 COVID-19 negative. UA positive for UTI.  CT head showed no acute abnormalities.  Chest x-ray negative.  Patient was admitted for further work-up.    Assessment and Plan:  Sepsis secondary to acute gastroenteritis with nausea, diarrhea, fevers -Met sepsis criteria on admission with fevers, tachycardia, lactic acidosis, source likely due to UTI, gastroenteritis -Patient was placed on IV fluid hydration, diarrhea has resolved completely. -Tolerating solids     UTI (urinary tract infection) -Urine culture and sensitivities showed multiple species, patient was placed on IV Rocephin, continue Keflex for 1 more day to complete full course   Mild acute metabolic encephalopathy - likely due  to #1  - improving       Malignant neoplasm of upper-outer quadrant of right breast in female, estrogen receptor positive ([place]) -Continue tamoxifen   Paroxysmal atrial fibrillation ([facility]) -rate controlled, resume Toprol XL 30m daily   -Continue eliquis     Generalized weakness -PT recommended outpatient PT     Hypertension -Resume antihypertensives       Hypokalemia -replaced      Hypomagnesemia - replaced IV    Obesity  Estimated body mass index is 37.6 kg/m as calculated from the following:   Height as of this encounter: 5' 1" (1.549 m).   Weight as of this encounter: 90.3 kg.       Pain control - [place]. and patient was instructed, not to drive, operate heavy machinery, perform activities at heights, swimming or participation in water activities or provide baby-sitting services while on Pain, Sleep and Anxiety Medications; until their outpatient Physician has advised to do so again. Also recommended to not to take more than prescribed Pain, Sleep and Anxiety Medications.  Consultants: None Procedures performed: None Disposition: Home Diet recommendation:  Discharge Diet Orders (From admission, onward)     Start     Ordered   08/23/22 0000  Diet - low sodium heart healthy        08/23/22 1041            DISCHARGE MEDICATION: Allergies as of 08/23/2022       Reactions   Lotensin [benazepril Hcl] Swelling   Morphine Itching, Rash   Erythromycin Base Other (See Comments)   Unknown reaction  Hydrocodone Other (See Comments)   Unknown reaction   Other Swelling   Mustard - vaginal swelling/pain   Banana Swelling   vaginal swelling/pain   Chocolate Swelling   vaginal swelling/pain   Gluten Meal Diarrhea, Other (See Comments)   Upset stomach    Tizanidine Other (See Comments)   Lowered blood pressure    Wound Dressing Adhesive Other (See Comments)   Skin burns, blisters          Medication List     TAKE these medications    acetaminophen 500 MG tablet Commonly known as: TYLENOL Take 500 mg by mouth every 6 (six) hours as needed for moderate pain, headache or mild pain.   apixaban 5 MG Tabs tablet Commonly known as: ELIQUIS Take 1 tablet (5 mg total) by mouth 2 (two) times daily. Pt needs 90 day supply.  Please keep on file till next refill. What changed: additional instructions   ascorbic acid 500 MG tablet Commonly known as: VITAMIN C Take 500 mg by mouth daily.   calcipotriene 0.005 % cream Commonly known as: DOVONOX Apply 1 application topically See admin instructions. Mix with fluorouracil 5% cream and apply topically twice daily for five days - as needed for precancerous on right cheek.   cephALEXin 500 MG capsule Commonly known as: KEFLEX Take 1 capsule (500 mg total) by mouth 2 (two) times daily for 1 day.   cetirizine 10 MG tablet Commonly known as: ZYRTEC Take 10 mg by mouth in the morning.   cholecalciferol 25 MCG (1000 UNIT) tablet Commonly known as: VITAMIN D3 Take 1,000-2,000 Units by mouth See admin instructions. Take 1000 units in the morning and 2000 units at bedtime   dicyclomine 10 MG capsule Commonly known as: BENTYL Take 1 capsule (10 mg total) by mouth 3 (three) times daily as needed for spasms.   ferrous sulfate 325 (65 FE) MG tablet Take 325 mg by mouth every morning.   fluorouracil 5 % cream Commonly known as: EFUDEX Apply 1 application  topically 2 (two) times daily as needed (precancerous spots on right temple and left eyebrow). Mix with calcipotriene 0.005% cream and apply topically twice daily for five days - as needed for precancerous on right temple and left eyebrow   gabapentin 100 MG capsule Commonly known as: NEURONTIN Take 200 mg by mouth at bedtime.   loperamide 2 MG tablet Commonly known as: IMODIUM A-D Take 1 tablet (2 mg total) by mouth 3 (three) times daily as needed for diarrhea or loose stools  (also available OTC).   losartan-hydrochlorothiazide 100-25 MG tablet Commonly known as: Hyzaar Take 1 tablet by mouth daily. Hold for 2 days Start taking on: August 26, 2022 What changed:  additional instructions These instructions start on August 26, 2022. If you are unsure what to do until then, ask your doctor or other care provider.   metFORMIN 500 MG 24 hr tablet Commonly known as: GLUCOPHAGE-XR TAKE ONE TABLET BY MOUTH EVERY DAY What changed: when to take this   metoprolol succinate 50 MG 24 hr tablet Commonly known as: TOPROL-XL Take 1 tablet (50 mg total) by mouth daily. Take with or immediately following a meal.   ondansetron 4 MG tablet Commonly known as: ZOFRAN Take 1 tablet (4 mg total) by mouth every 8 (eight) hours as needed for nausea or vomiting.   pantoprazole 40 MG tablet Commonly known as: PROTONIX Take 40 mg by mouth 2 (two) times daily before a meal.   potassium chloride SA 20  MEQ tablet Commonly known as: Klor-Con M20 Take 1 tablet (20 mEq total) by mouth daily.   PRENATAL VITAMIN PLUS LOW IRON PO Take 1 tablet by mouth in the morning.   tamoxifen 20 MG tablet Commonly known as: NOLVADEX Take 1 tablet (20 mg total) by mouth every morning.   triamcinolone cream 0.1 % Commonly known as: KENALOG Apply 1 application topically 2 (two) times daily as needed (psoriasis). Mixed with Cetaphil 1:1   UNABLE TO FIND OUTPATIENT PHYSICAL THERAPY    Diagnosis: Generalized debility due to UTI, acute gastroenteritis   venlafaxine XR 37.5 MG 24 hr capsule Commonly known as: EFFEXOR-XR Take 37.5-75 mg by mouth See admin instructions. Take 1 capsule (75 mg) + 1 capsule (37.5 mg) to equal (112.5 mg) at bedtime        Follow-up Information     Matthews, Cody, DO. Schedule an appointment as soon as possible for a visit in 10 day(s).   Specialty: Family Medicine Why: for hospital follow-up Contact information: [address]  [address] [place] [address] [phone]                Discharge Exam: Filed Weights   08/21/22 1855  Weight: 90.3 kg   S: Feels much better, diarrhea has fully resolved.  No fevers or chills, nausea or vomiting, tolerating solid diet.  Has worked with PT yesterday  Vitals:   08/22/22 2147 08/22/22 2244 08/23/22 0440 08/23/22 0937  BP: (!) 105/44  (!) 162/68 108/69  Pulse: 72 70 66 77  Resp: 18 17 20 18  Temp: 97.8 F (36.6 C)  97.7 F (36.5 C) 98.2 F (36.8 C)  TempSrc: Oral  Oral Oral  SpO2: 100%  100% 96%  Weight:      Height:         Physical Exam General: Alert and oriented x 3, NAD Cardiovascular: S1 S2 clear, RRR.  Respiratory: CTAB, no wheezing, rales or rhonchi Gastrointestinal: Soft, nontender, nondistended, NBS Ext: no pedal edema bilaterally Neuro: no new deficits Psych: Normal affect and demeanor, alert and oriented x3    Condition at discharge: fair  The results of significant diagnostics from this hospitalization (including imaging, microbiology, ancillary and laboratory) are listed below for reference.   Imaging Studies: CT ABDOMEN PELVIS W CONTRAST  Result Date: 08/21/2022 CLINICAL DATA:  Fever, diarrhea, pain EXAM: CT ABDOMEN AND PELVIS WITH CONTRAST TECHNIQUE: Multidetector CT imaging of the abdomen and pelvis was performed using the standard protocol following bolus administration of intravenous contrast. RADIATION DOSE REDUCTION: This exam was performed according to the departmental dose-optimization program which includes automated exposure control, adjustment of the mA and/or kV according to patient size and/or use of iterative reconstruction technique. CONTRAST:  75mL OMNIPAQUE IOHEXOL 350 MG/ML SOLN COMPARISON:  10/13/2021 FINDINGS: Lower chest: No acute pleural or parenchymal lung disease. Small hiatal hernia. Hepatobiliary: No focal liver abnormality is seen. Status post cholecystectomy. No biliary dilatation. Pancreas: Unremarkable. No  pancreatic ductal dilatation or surrounding inflammatory changes. Spleen: Normal in size without focal abnormality. Adrenals/Urinary Tract: Adrenal glands are unremarkable. Kidneys are normal, without renal calculi, focal lesion, or hydronephrosis. Bladder is decompressed, limiting its evaluation. Stomach/Bowel: No bowel obstruction or ileus. The appendix is surgically absent. No bowel wall thickening or inflammatory change. 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 Vascular/Lymphatic: Aortic atherosclerosis. No enlarged abdominal or pelvic lymph nodes. Reproductive: Status post hysterectomy. No adnexal masses. Other: No free fluid or free intraperitoneal gas. No abdominal wall hernia. Musculoskeletal:   No acute or destructive bony lesions. Reconstructed images demonstrate no additional findings. IMPRESSION: 1. No acute intra-abdominal or intrapelvic process. 2. Small hiatal hernia. 3.  Aortic Atherosclerosis (ICD10-I70.0). Electronically Signed   By: Randa Ngo M.D.   On: 08/21/2022 22:22   CT Head Wo Contrast  Result Date: 08/21/2022 CLINICAL DATA:  Mental status change. EXAM: CT HEAD WITHOUT CONTRAST TECHNIQUE: Contiguous axial images were obtained from the base of the skull through the vertex without intravenous contrast. RADIATION DOSE REDUCTION: This exam was performed according to the departmental dose-optimization program which includes automated exposure control, adjustment of the mA and/or kV according to patient size and/or use of iterative reconstruction technique. COMPARISON:  MRI brain 10/13/2021.  CT head 3 15 2011. FINDINGS: Brain: No evidence of acute infarction, hemorrhage, hydrocephalus, extra-axial collection or mass lesion/mass effect. Vascular: Atherosclerotic calcifications are present within the cavernous internal carotid arteries. Skull: Normal. Negative for fracture or focal lesion. Sinuses/Orbits: No acute finding. Other: None. IMPRESSION: No acute intracranial  abnormality. Electronically Signed   By: Ronney Asters M.D.   On: 08/21/2022 22:18   DG Chest Port 1 View  Result Date: 08/21/2022 CLINICAL DATA:  Fever, diarrhea, questionable sepsis EXAM: PORTABLE CHEST 1 VIEW COMPARISON:  10/12/2021 FINDINGS: Single frontal view of the chest demonstrates an unremarkable cardiac silhouette. No acute airspace disease, effusion, or pneumothorax. No acute bony abnormalities. IMPRESSION: 1. No acute intrathoracic process. Electronically Signed   By: Randa Ngo M.D.   On: 08/21/2022 19:35    Microbiology: Results for orders placed or performed during the hospital encounter of 08/21/22  Blood Culture (routine x 2)     Status: None (Preliminary result)   Collection Time: 08/21/22  7:45 PM   Specimen: BLOOD  Result Value Ref Range Status   Specimen Description BLOOD RIGHT ANTECUBITAL  Final   Special Requests   Final    BOTTLES DRAWN AEROBIC AND ANAEROBIC Blood Culture results may not be optimal due to an excessive volume of blood received in culture bottles   Culture   Final    NO GROWTH 2 DAYS Performed at [facility] [address], [place], [place] [address]    Report Status PENDING  Incomplete  Blood Culture (routine x 2)     Status: None (Preliminary result)   Collection Time: 08/21/22  7:51 PM   Specimen: BLOOD  Result Value Ref Range Status   Specimen Description BLOOD BLOOD RIGHT FOREARM  Final   Special Requests   Final    BOTTLES DRAWN AEROBIC AND ANAEROBIC Blood Culture adequate volume   Culture   Final    NO GROWTH 2 DAYS Performed at [facility], [place] [address] [place], [place] [address]    Report Status PENDING  Incomplete  Resp Panel by RT-PCR (Flu A&B, Covid) Anterior Nasal Swab     Status: None   Collection Time: 08/21/22  7:54 PM   Specimen: Anterior Nasal Swab  Result Value Ref Range Status   SARS Coronavirus 2 by RT PCR NEGATIVE NEGATIVE Final    Comment: (NOTE) SARS-CoV-2 target nucleic acids are NOT  DETECTED.  The SARS-CoV-2 RNA is generally detectable in upper respiratory specimens during the acute phase of infection. The lowest concentration of SARS-CoV-2 viral copies this assay can detect is 138 copies/mL. A negative result does not preclude SARS-Cov-2 infection and should not be used as the sole basis for treatment or other patient management decisions. A negative result may occur with  improper specimen  collection/handling, submission of specimen other than nasopharyngeal swab, presence of viral mutation(s) within the areas targeted by this assay, and inadequate number of viral copies(<138 copies/mL). A negative result must be combined with clinical observations, patient history, and epidemiological information. The expected result is Negative.  Fact Sheet for Patients:  https://www.fda.gov/media/152166/download  Fact Sheet for Healthcare Providers:  https://www.fda.gov/media/152162/download  This test is no t yet approved or cleared by the [place] FDA and  has been authorized for detection and/or diagnosis of SARS-CoV-2 by FDA under an Emergency Use Authorization (EUA). This EUA will remain  in effect (meaning this test can be used) for the duration of the COVID-19 declaration under Section 564(b)(1) of the Act, 21 U.S.C.section 360bbb-3(b)(1), unless the authorization is terminated  or revoked sooner.       Influenza A by PCR NEGATIVE NEGATIVE Final   Influenza B by PCR NEGATIVE NEGATIVE Final    Comment: (NOTE) The Xpert Xpress SARS-CoV-2/FLU/RSV plus assay is intended as an aid in the diagnosis of influenza from Nasopharyngeal swab specimens and should not be used as a sole basis for treatment. Nasal washings and aspirates are unacceptable for Xpert Xpress SARS-CoV-2/FLU/RSV testing.  Fact Sheet for Patients: https://www.fda.gov/media/152166/download  Fact Sheet for Healthcare Providers: https://www.fda.gov/media/152162/download  This test is not yet  approved or cleared by the [place] FDA and has been authorized for detection and/or diagnosis of SARS-CoV-2 by FDA under an Emergency Use Authorization (EUA). This EUA will remain in effect (meaning this test can be used) for the duration of the COVID-19 declaration under Section 564(b)(1) of the Act, 21 U.S.C. section 360bbb-3(b)(1), unless the authorization is terminated or revoked.  Performed at [facility], [address] [place], [place] [address]   Urine Culture     Status: Abnormal   Collection Time: 08/22/22  4:08 AM   Specimen: In/Out Cath Urine  Result Value Ref Range Status   Specimen Description IN/OUT CATH URINE  Final   Special Requests   Final    NONE Performed at [facility], [address] [place], [place] [address]    Culture MULTIPLE SPECIES PRESENT, SUGGEST RECOLLECTION (A)  Final   Report Status 08/23/2022 FINAL  Final    Labs: CBC: Recent Labs  Lab 08/21/22 1945  WBC 4.7  NEUTROABS 3.5  HGB 12.2  HCT 36.3  MCV 95.3  PLT 146*   Basic Metabolic Panel: Recent Labs  Lab 08/21/22 1945 08/22/22 0429 08/23/22 0814  NA 138 140 138  K 3.1* 3.3* 4.0  CL 100 106 106  CO2 24 25 23  GLUCOSE 134* 136* 133*  BUN 15 9 6*  CREATININE 0.88 0.66 0.64  CALCIUM 9.2 8.3* 8.0*  MG  --  1.4*  --    Liver Function Tests: Recent Labs  Lab 08/21/22 1945  AST 250*  ALT 98*  ALKPHOS 40  BILITOT 0.6  PROT 6.5  ALBUMIN 3.5   CBG: No results for input(s): "GLUCAP" in the last 168 hours.  Discharge time spent: greater than 30 minutes.  Signed: Ripudeep Rai, MD [facility] 08/23/2022 

## 2022-08-23 NOTE — Evaluation (Signed)
Occupational Therapy Evaluation Patient Details Name: Kayla Bennett MRN: [id number] DOB: [birth year] Today's Date: 08/23/2022   History of Present Illness Pt is a 71 y/o female admitted secondary to diarrhea. Thought to be secondary to gastroenteritis. PMH includes breast CA, a fib, and HTN.   Clinical Impression   Pt independent at baseline with ADLs and functional mobility, lives with spouse who can assist at d/c. Pt currently with 4/10 BLE pain, completes ADLs, transfers, and bed mobility with min guard A. Pt with mild LOB x1 with in-room ambulation however able to self correct. Pt presenting with impairments listed below, will follow acutely. Recommend d/c home with family assistance.     Recommendations for follow up therapy are one component of a multi-disciplinary discharge planning process, led by the attending physician.  Recommendations may be updated based on patient status, additional functional criteria and insurance authorization.   Follow Up Recommendations  No OT follow up    Assistance Recommended at Discharge Set up Supervision/Assistance  Patient can return home with the following A little help with walking and/or transfers;A little help with bathing/dressing/bathroom;Assistance with cooking/housework;Assist for transportation    Functional Status Assessment  Patient has had a recent decline in their functional status and demonstrates the ability to make significant improvements in function in a reasonable and predictable amount of time.  Equipment Recommendations  None recommended by OT    Recommendations for Other Services PT consult     Precautions / Restrictions Precautions Precautions: Fall Restrictions Weight Bearing Restrictions: No      Mobility Bed Mobility Overal bed mobility: Needs Assistance Bed Mobility: Supine to Sit     Supine to sit: Min guard          Transfers Overall transfer level: Needs assistance Equipment used:  None Transfers: Sit to/from Stand Sit to Stand: Min guard           General transfer comment: mild LOB however able to self correct      Balance Overall balance assessment: Needs assistance Sitting-balance support: No upper extremity supported, Feet supported Sitting balance-Leahy Scale: Good     Standing balance support: Single extremity supported Standing balance-Leahy Scale: Fair                             ADL either performed or assessed with clinical judgement   ADL Overall ADL's : Needs assistance/impaired Eating/Feeding: Modified independent   Grooming: Modified independent;Wash/dry face;Wash/dry hands;Standing   Upper Body Bathing: Min guard   Lower Body Bathing: Min guard   Upper Body Dressing : Min guard   Lower Body Dressing: Min guard   Toilet Transfer: Min guard   Toileting- Clothing Manipulation and Hygiene: Min guard       Functional mobility during ADLs: Min guard       Vision Baseline Vision/History: 1 Wears glasses Vision Assessment?: No apparent visual deficits     Perception     Praxis      Pertinent Vitals/Pain Pain Assessment Pain Assessment: Faces Pain Score: 4  Faces Pain Scale: Hurts little more Pain Location: BLE's Pain Descriptors / Indicators: Discomfort Pain Intervention(s): Limited activity within patient's tolerance, Premedicated before session, Monitored during session     Hand Dominance     Extremity/Trunk Assessment Upper Extremity Assessment Upper Extremity Assessment: Generalized weakness   Lower Extremity Assessment Lower Extremity Assessment: Defer to PT evaluation   Cervical / Trunk Assessment Cervical / Trunk Assessment: Kyphotic   Communication  Communication Communication: No difficulties   Cognition Arousal/Alertness: Awake/alert Behavior During Therapy: WFL for tasks assessed/performed Overall Cognitive Status: Within Functional Limits for tasks assessed                                        General Comments  VSS on RA    Exercises     Shoulder Instructions      Home Living Family/patient expects to be discharged to:: Private residence Living Arrangements: Spouse/significant other Available Help at Discharge: Family Type of Home: House Home Access: Level entry     [place]: One level     Bathroom Shower/Tub: Hospital doctor Toilet: Handicapped height     [place]: [place] - single Barista (2 wheels);Shower seat;BSC/3in1   Additional Comments: wears O2 at night      Prior Functioning/Environment Prior Level of Function : Independent/Modified Independent             Mobility Comments: Independent ADLs Comments: uses shower seat; hand-held shower        OT Problem List: Decreased strength;Decreased range of motion;Decreased activity tolerance;Impaired balance (sitting and/or standing)      OT Treatment/Interventions: Self-care/ADL training;Therapeutic exercise;Energy conservation;DME and/or AE instruction;Therapeutic activities;Patient/family education;Balance training    OT Goals(Current goals can be found in the care plan section) Acute Rehab OT Goals Patient Stated Goal: none stated OT Goal Formulation: With patient Time For Goal Achievement: 09/06/22 Potential to Achieve Goals: Good ADL Goals Pt Will Perform Grooming: Independently;standing Pt Will Perform Upper Body Dressing: Independently;standing;sitting Pt Will Perform Lower Body Dressing: Independently;sitting/lateral leans;sit to/from stand Pt Will Transfer to Toilet: Independently;ambulating;regular height toilet  OT Frequency: Min 2X/week    Co-evaluation              AM-PAC OT "6 Clicks" Daily Activity     Outcome Measure Help from another person eating meals?: None Help from another person taking care of personal grooming?: None Help from another person toileting, which includes using toliet, bedpan, or urinal?:  None Help from another person bathing (including washing, rinsing, drying)?: A Little Help from another person to put on and taking off regular upper body clothing?: None Help from another person to put on and taking off regular lower body clothing?: A Little 6 Click Score: 22   End of Session Nurse Communication: Mobility status  Activity Tolerance: Patient tolerated treatment well Patient left: in chair;with call bell/phone within reach  OT Visit Diagnosis: Unsteadiness on feet (R26.81);Other abnormalities of gait and mobility (R26.89);Muscle weakness (generalized) (M62.81)                Time: 7616-0737 OT Time Calculation (min): 16 min Charges:  OT General Charges $OT Visit: 1 Visit OT Evaluation $OT Eval Low Complexity: 1 Low  Lynnda Child, OTD, OTR/L Acute Rehab [phone] - [place] 08/23/2022, 8:27 AM

## 2022-08-25 ENCOUNTER — Ambulatory Visit: Payer: Medicare Other

## 2022-08-26 ENCOUNTER — Inpatient Hospital Stay: Payer: Medicare Other

## 2022-08-26 ENCOUNTER — Inpatient Hospital Stay: Payer: Medicare Other | Admitting: Nurse Practitioner

## 2022-08-26 LAB — CULTURE, BLOOD (ROUTINE X 2)
Culture: NO GROWTH
Culture: NO GROWTH
Special Requests: ADEQUATE

## 2022-08-27 ENCOUNTER — Emergency Department (HOSPITAL_COMMUNITY)
Admission: EM | Admit: 2022-08-27 | Discharge: 2022-08-27 | Disposition: A | Discharge status: Home / Self Care | Payer: Medicare Other | Attending: Emergency Medicine | Admitting: Emergency Medicine

## 2022-08-27 ENCOUNTER — Encounter (HOSPITAL_COMMUNITY): Payer: Self-pay

## 2022-08-27 ENCOUNTER — Other Ambulatory Visit: Payer: Self-pay

## 2022-08-27 ENCOUNTER — Emergency Department (HOSPITAL_COMMUNITY): Payer: Medicare Other

## 2022-08-27 DIAGNOSIS — R051 Acute cough: Secondary | ICD-10-CM

## 2022-08-27 DIAGNOSIS — M791 Myalgia, unspecified site: Secondary | ICD-10-CM

## 2022-08-27 DIAGNOSIS — M797 Fibromyalgia: Secondary | ICD-10-CM | POA: Insufficient documentation

## 2022-08-27 DIAGNOSIS — U071 COVID-19: Secondary | ICD-10-CM | POA: Diagnosis not present

## 2022-08-27 DIAGNOSIS — J45909 Unspecified asthma, uncomplicated: Secondary | ICD-10-CM | POA: Diagnosis not present

## 2022-08-27 DIAGNOSIS — I10 Essential (primary) hypertension: Secondary | ICD-10-CM | POA: Diagnosis not present

## 2022-08-27 DIAGNOSIS — Z7901 Long term (current) use of anticoagulants: Secondary | ICD-10-CM | POA: Diagnosis not present

## 2022-08-27 DIAGNOSIS — Z853 Personal history of malignant neoplasm of breast: Secondary | ICD-10-CM | POA: Insufficient documentation

## 2022-08-27 DIAGNOSIS — R509 Fever, unspecified: Secondary | ICD-10-CM | POA: Diagnosis present

## 2022-08-27 DIAGNOSIS — R059 Cough, unspecified: Secondary | ICD-10-CM | POA: Diagnosis not present

## 2022-08-27 LAB — COMPREHENSIVE METABOLIC PANEL
ALT: 73 U/L — ABNORMAL HIGH (ref 0–44)
AST: 117 U/L — ABNORMAL HIGH (ref 15–41)
Albumin: 3.3 g/dL — ABNORMAL LOW (ref 3.5–5.0)
Alkaline Phosphatase: 60 U/L (ref 38–126)
Anion gap: 11 (ref 5–15)
BUN: 7 mg/dL — ABNORMAL LOW (ref 8–23)
CO2: 25 mmol/L (ref 22–32)
Calcium: 8.8 mg/dL — ABNORMAL LOW (ref 8.9–10.3)
Chloride: 103 mmol/L (ref 98–111)
Creatinine, Ser: 0.75 mg/dL (ref 0.44–1.00)
GFR, Estimated: >60 mL/min (ref >60)
Glucose, Bld: 123 mg/dL — ABNORMAL HIGH (ref 70–99)
Potassium: 3.5 mmol/L (ref 3.5–5.1)
Sodium: 139 mmol/L (ref 135–145)
Total Bilirubin: 0.5 mg/dL (ref 0.3–1.2)
Total Protein: 6.6 g/dL (ref 6.5–8.1)

## 2022-08-27 LAB — CBC WITH DIFFERENTIAL/PLATELET
Abs Immature Granulocytes: 0.02 10*3/uL (ref 0.00–0.07)
Basophils Absolute: 0 10*3/uL (ref 0.0–0.1)
Basophils Relative: 1 %
Eosinophils Absolute: 0.1 10*3/uL (ref 0.0–0.5)
Eosinophils Relative: 1 %
HCT: 32.2 % — ABNORMAL LOW (ref 36.0–46.0)
Hemoglobin: 10.9 g/dL — ABNORMAL LOW (ref 12.0–15.0)
Immature Granulocytes: 0 %
Lymphocytes Relative: 22 %
Lymphs Abs: 1.2 10*3/uL (ref 0.7–4.0)
MCH: 32.2 pg (ref 26.0–34.0)
MCHC: 33.9 g/dL (ref 30.0–36.0)
MCV: 95.3 fL (ref 80.0–100.0)
Monocytes Absolute: 0.6 10*3/uL (ref 0.1–1.0)
Monocytes Relative: 12 %
Neutro Abs: 3.3 10*3/uL (ref 1.7–7.7)
Neutrophils Relative %: 64 %
Platelets: 193 10*3/uL (ref 150–400)
RBC: 3.38 MIL/uL — ABNORMAL LOW (ref 3.87–5.11)
RDW: 12.7 % (ref 11.5–15.5)
WBC: 5.2 10*3/uL (ref 4.0–10.5)
nRBC: 0 % (ref 0.0–0.2)

## 2022-08-27 LAB — URINALYSIS, ROUTINE W REFLEX MICROSCOPIC
Bilirubin Urine: NEGATIVE
Glucose, UA: NEGATIVE mg/dL
Hgb urine dipstick: NEGATIVE
Ketones, ur: NEGATIVE mg/dL
Leukocytes,Ua: NEGATIVE
Nitrite: NEGATIVE
Protein, ur: NEGATIVE mg/dL
Specific Gravity, Urine: 1.028 (ref 1.005–1.030)
pH: 7 (ref 5.0–8.0)

## 2022-08-27 LAB — PROTIME-INR
INR: 1.1 (ref 0.8–1.2)
Prothrombin Time: 14.4 seconds (ref 11.4–15.2)

## 2022-08-27 LAB — RESP PANEL BY RT-PCR (FLU A&B, COVID) ARPGX2
Influenza A by PCR: NEGATIVE
Influenza B by PCR: NEGATIVE
SARS Coronavirus 2 by RT PCR: POSITIVE — AB

## 2022-08-27 LAB — LACTIC ACID, PLASMA: Lactic Acid, Venous: 1.5 mmol/L (ref 0.5–1.9)

## 2022-08-27 LAB — APTT: aPTT: 29 seconds (ref 24–36)

## 2022-08-27 MED ORDER — IBUPROFEN 400 MG PO TABS
600.0000 mg | ORAL_TABLET | Freq: Once | ORAL | Status: AC
  Administered 2022-08-27: 600 mg via ORAL
  Filled 2022-08-27: qty 1

## 2022-08-27 NOTE — Discharge Instructions (Signed)
Your work-up today revealed you do indeed now have COVID-19 as the likely cause of your fever, malaise, fatigue, muscle soreness, and cough.  The x-ray did not show pneumonia and your labs were all improving from prior.  Please rest and stay hydrated.  Given your elevated liver function, please avoid Tylenol although it is improving compared to last time.  Please call to see your doctor next week for repeat labs and reassessment.  If any symptoms change or worsen acutely, please return to the nearest emergency department.

## 2022-08-27 NOTE — ED Triage Notes (Signed)
Reports woke up with 102 fever this am with bodyaches and chills.  +nausea.  Recently here for diarrhea and dehydration which she has had no symptoms.

## 2022-08-27 NOTE — ED Notes (Signed)
Incorrectcharting of sats of 87%

## 2022-08-27 NOTE — ED Provider Triage Note (Signed)
Emergency Medicine Provider Triage Evaluation Note  JESTINA STEPHANI , a 71 y.o. female  was evaluated in triage.  Pt complains of fever 102.7 degrees this morning along with body aches, chills and nausea.  Denies any fall, injury.  She reports recent admission to the hospital for diarrhea.  Review of Systems  Positive: Body aches, chills, nausea, fever Negative: Chest pain, abdominal pain, vomiting, diarrhea  Physical Exam  BP 115/67 (BP Location: Left Arm)   Pulse 84   Temp 99.1 F (37.3 C) (Oral)   Resp 16   Ht '5\' 1"'$  (1.549 m)   Wt 90.3 kg   SpO2 94%   BMI 37.60 kg/m  Gen:   Awake, no distress   Resp:  Normal effort  MSK:   Moves extremities without difficulty  Other:  Abdomen soft nontender.  Lungs clear to auscultation bilaterally.  Heart regular rate and rhythm.  Medical Decision Making  Medically screening exam initiated at 10:04 AM.  Appropriate orders placed.  Thatiana Renbarger Vanzee was informed that the remainder of the evaluation will be completed by another provider, this initial triage assessment does not replace that evaluation, and the importance of remaining in the ED until their evaluation is complete.    Note: Portions of this report may have been transcribed using voice recognition software. Every effort was made to ensure accuracy; however, inadvertent computerized transcription errors may still be present.    Deliah Boston, PA-C 08/27/22 1005

## 2022-08-27 NOTE — ED Provider Notes (Signed)
[facility] EMERGENCY DEPARTMENT Provider Note   CSN: [id number] Arrival date & time: 08/27/22  7782     History  Chief Complaint  Patient presents with   Fever    Kayla Bennett is a 71 y.o. female.  The history is provided by the patient, medical records and the spouse. No language interpreter was used.  Fever Max temp prior to arrival:  102.7 Temp source:  Oral Severity:  Moderate Onset quality:  Gradual Duration:  2 days Timing:  Constant Progression:  Waxing and waning Chronicity:  New Relieved by:  Nothing Worsened by:  Nothing Ineffective treatments:  None tried Associated symptoms: cough and myalgias   Associated symptoms: no chest pain, no chills, no confusion, no congestion, no diarrhea, no dysuria, no headaches, no nausea, no rash, no rhinorrhea, no somnolence and no vomiting   Risk factors: recent sickness (recent uti)        Home Medications Prior to Admission medications   Medication Sig Start Date End Date Taking? Authorizing Provider  acetaminophen (TYLENOL) 500 MG tablet Take 500 mg by mouth every 6 (six) hours as needed for moderate pain, headache or mild pain.    [provider]  apixaban (ELIQUIS) 5 MG TABS tablet Take 1 tablet (5 mg total) by mouth 2 (two) times daily. Pt needs 90 day supply.  Please keep on file till next refill. Patient taking differently: Take 5 mg by mouth 2 (two) times daily. 07/25/22 07/25/23  Camnitz, Ocie Doyne, MD  calcipotriene (DOVONOX) 0.005 % cream Apply 1 application topically See admin instructions. Mix with fluorouracil 5% cream and apply topically twice daily for five days - as needed for precancerous on right cheek.    [provider]  cetirizine (ZYRTEC) 10 MG tablet Take 10 mg by mouth in the morning.    [provider]  cholecalciferol (VITAMIN D3) 25 MCG (1000 UNIT) tablet Take 1,000-2,000 Units by mouth See admin instructions. Take 1000 units in the morning and 2000  units at bedtime    [provider]  dicyclomine (BENTYL) 10 MG capsule Take 1 capsule (10 mg total) by mouth 3 (three) times daily as needed for spasms. 08/23/22   Rai, Ripudeep K, MD  ferrous sulfate 325 (65 FE) MG tablet Take 325 mg by mouth every morning.    [provider]  fluorouracil (EFUDEX) 5 % cream Apply 1 application  topically 2 (two) times daily as needed (precancerous spots on right temple and left eyebrow). Mix with calcipotriene 0.005% cream and apply topically twice daily for five days - as needed for precancerous on right temple and left eyebrow    [provider]  gabapentin (NEURONTIN) 100 MG capsule Take 200 mg by mouth at bedtime.    [provider]  loperamide (IMODIUM A-D) 2 MG tablet Take 1 tablet (2 mg total) by mouth 3 (three) times daily as needed for diarrhea or loose stools (also available OTC). 08/23/22   Rai, Vernelle Emerald, MD  losartan-hydrochlorothiazide (HYZAAR) 100-25 MG tablet Take 1 tablet by mouth daily. Hold for 2 days 08/26/22   Rai, Vernelle Emerald, MD  metFORMIN (GLUCOPHAGE-XR) 500 MG 24 hr tablet TAKE ONE TABLET BY MOUTH EVERY DAY Patient taking differently: Take 500 mg by mouth at bedtime. 08/14/22   Luetta Nutting, DO  metoprolol succinate (TOPROL-XL) 50 MG 24 hr tablet Take 1 tablet (50 mg total) by mouth daily. Take with or immediately following a meal. 04/22/22   Freada Bergeron, MD  ondansetron (ZOFRAN) 4 MG tablet Take 1 tablet (4 mg total) by mouth every 8 (eight) hours as needed for nausea or vomiting. 08/23/22   Rai, Ripudeep K, MD  pantoprazole (PROTONIX) 40 MG tablet Take 40 mg by mouth 2 (two) times daily before a meal.    [provider]  potassium chloride SA (KLOR-CON M20) 20 MEQ tablet Take 1 tablet (20 mEq total) by mouth daily. 02/25/22   Fenton, Clint R, PA  Prenatal Vit-Fe Fumarate-FA (PRENATAL VITAMIN PLUS LOW IRON PO) Take 1 tablet by mouth in the morning.    [provider]  tamoxifen  (NOLVADEX) 20 MG tablet Take 1 tablet (20 mg total) by mouth every morning. 08/21/22   Truitt Merle, MD  triamcinolone (KENALOG) 0.1 % Apply 1 application topically 2 (two) times daily as needed (psoriasis). Mixed with Cetaphil 1:1    [provider]  UNABLE TO FIND OUTPATIENT PHYSICAL THERAPY    Diagnosis: Generalized debility due to UTI, acute gastroenteritis 08/23/22   Rai, Ripudeep K, MD  venlafaxine XR (EFFEXOR-XR) 37.5 MG 24 hr capsule Take 37.5-75 mg by mouth See admin instructions. Take 1 capsule (75 mg) + 1 capsule (37.5 mg) to equal (112.5 mg) at bedtime 02/07/21   [provider]  vitamin C (ASCORBIC ACID) 500 MG tablet Take 500 mg by mouth daily.    [provider]      Allergies    Lotensin [benazepril hcl], Morphine, Erythromycin base, Hydrocodone, Other, Banana, Chocolate, Gluten meal, Tizanidine, and Wound dressing adhesive    Review of Systems   Review of Systems  Constitutional:  Positive for fatigue and fever. Negative for chills and diaphoresis.  HENT:  Negative for congestion and rhinorrhea.   Respiratory:  Positive for cough. Negative for chest tightness, shortness of breath, wheezing and stridor.   Cardiovascular:  Negative for chest pain, palpitations and leg swelling.  Gastrointestinal:  Negative for diarrhea, nausea and vomiting.  Genitourinary:  Negative for dysuria, flank pain and frequency.  Musculoskeletal:  Positive for myalgias. Negative for back pain.  Skin:  Negative for rash and wound.  Neurological:  Negative for dizziness, weakness, light-headedness and headaches.  Psychiatric/Behavioral:  Negative for confusion.   All other systems reviewed and are negative.   Physical Exam Updated Vital Signs BP (!) 143/58   Pulse 72   Temp 99.7 F (37.6 C) (Oral)   Resp 16   Ht '5\' 1"'$  (1.549 m)   Wt 90.3 kg   SpO2 97%   BMI 37.60 kg/m  Physical Exam Vitals and nursing note reviewed.  Constitutional:      General: She is not in  acute distress.    Appearance: She is well-developed. She is not ill-appearing, toxic-appearing or diaphoretic.  HENT:     Head: Normocephalic and atraumatic.     Nose: Congestion present. No rhinorrhea.     Mouth/Throat:     Mouth: Mucous membranes are moist.     Pharynx: No oropharyngeal exudate or posterior oropharyngeal erythema.  Eyes:     Extraocular Movements: Extraocular movements intact.     Conjunctiva/sclera: Conjunctivae normal.     Pupils: Pupils are equal, round, and reactive to light.  Cardiovascular:     Rate and Rhythm: Normal rate and regular rhythm.     Heart sounds: No murmur heard. Pulmonary:     Effort: Pulmonary effort is normal. No respiratory distress.     Breath sounds: Normal breath sounds. No wheezing, rhonchi or rales.  Chest:  Chest wall: No tenderness.  Abdominal:     General: Abdomen is flat.     Palpations: Abdomen is soft.     Tenderness: There is no abdominal tenderness. There is no right CVA tenderness, left CVA tenderness, guarding or rebound.  Musculoskeletal:        General: No swelling or tenderness.     Cervical back: Neck supple. No tenderness.  Skin:    General: Skin is warm and dry.     Capillary Refill: Capillary refill takes less than 2 seconds.     Findings: No erythema.  Neurological:     Mental Status: She is alert.  Psychiatric:        Mood and Affect: Mood normal.     ED Results / Procedures / Treatments   Labs (all labs ordered are listed, but only abnormal results are displayed) Labs Reviewed  RESP PANEL BY RT-PCR (FLU A&B, COVID) ARPGX2 - Abnormal; Notable for the following components:      Result Value   SARS Coronavirus 2 by RT PCR POSITIVE (*)    All other components within normal limits  COMPREHENSIVE METABOLIC PANEL - Abnormal; Notable for the following components:   Glucose, Bld 123 (*)    BUN 7 (*)    Calcium 8.8 (*)    Albumin 3.3 (*)    AST 117 (*)    ALT 73 (*)    All other components within normal  limits  CBC WITH DIFFERENTIAL/PLATELET - Abnormal; Notable for the following components:   RBC 3.38 (*)    Hemoglobin 10.9 (*)    HCT 32.2 (*)    All other components within normal limits  URINALYSIS, ROUTINE W REFLEX MICROSCOPIC - Abnormal; Notable for the following components:   Color, Urine AMBER (*)    All other components within normal limits  CULTURE, BLOOD (ROUTINE X 2)  CULTURE, BLOOD (ROUTINE X 2)  URINE CULTURE  LACTIC ACID, PLASMA  PROTIME-INR  APTT    EKG EKG Interpretation  Date/Time:  Wednesday August 27 2022 17:55:36 EDT Ventricular Rate:  73 PR Interval:  161 QRS Duration: 78 QT Interval:  394 QTC Calculation: 435 R Axis:   65 Text Interpretation: Sinus rhythm when compared to prior, similar appearance. No STEMI Confirmed by Antony Blackbird [phone]) on 08/27/2022 6:24:05 PM  Radiology DG Chest 2 View  Result Date: 08/27/2022 CLINICAL DATA:  Cough EXAM: CHEST - 2 VIEW COMPARISON:  08/21/2022 FINDINGS: The heart size and mediastinal contours are within normal limits. Both lungs are clear. The visualized skeletal structures are unremarkable. IMPRESSION: No acute abnormality of the lungs. Electronically Signed   By: Delanna Ahmadi M.D.   On: 08/27/2022 10:20    Procedures Procedures    Medications Ordered in ED Medications  ibuprofen (ADVIL) tablet 600 mg (600 mg Oral Given 08/27/22 1721)    ED Course/ Medical Decision Making/ A&P                           Medical Decision Making Amount and/or Complexity of Data Reviewed Labs: ordered. Radiology: ordered.    Kayla Bennett is a 71 y.o. female with a past medical history significant for breast cancer, atrial fibrillation on Eliquis therapy, hypertension, asthma, migraines, hyperlipidemia, fibromyalgia, and recent admission for diarrhea and UTI who presents for fever, malaise, fatigue, and some cough.  According to patient, for the last few days she has had some chills and then spiked a fever yesterday  and today.  Patient has had no further nausea, vomiting, diarrhea, or urinary symptoms as those have improved but started having more malaise, fatigue, chills, and myalgias.  She said she had some mild cough but otherwise has not had chest pain or shortness of breath.  On my exam, lungs were clear and chest was nontender.  Abdomen nontender.  Patient was otherwise well-appearing and vital signs were reassuring.  She did have a near febrile temperature and the oxygen of 87  was reportedly erroneous.  Patient has had oxygen saturations in the 90s the entire rest of the time she has been here.  No other focal abnormalities on exam.  Patient had work-up that did reveal a positive COVID test.  This is new compared to last week when she was negative on PCR.  Her urinalysis has improved and her labs were otherwise similar and improving from prior.  Her liver function is improving from last time but we did tell her to avoid Tylenol for fever management and instead use some Motrin given her normal kidney function.  Otherwise her work-up was reassuring.  X-ray did not show pneumonia and patient was well-appearing with reassuring vital signs.  We had a shared decision made conversation and agree with discharge home.  As patient is unsure when her symptoms truly began, we will hold on giving her molnupiravir or Paxlovid.  Patient would not be a candidate for Paxlovid based on the medication use and we did offer mildly.  But given the inconclusive time when this began we will hold on it and she agrees.  Patient will follow-up with her PCP for repeat lab check next week and understood return precautions.  She no other questions or concerns and was discharged in good condition.         Final Clinical Impression(s) / ED Diagnoses Final diagnoses:  COVID-19  Acute cough  Myalgia    Rx / DC Orders ED Discharge Orders     None       Clinical Impression: 1. COVID-19   2. Acute cough   3. Myalgia      Disposition: Discharge  Condition: Good  I have discussed the results, Dx and Tx plan with the pt(& family if present). He/she/they expressed understanding and agree(s) with the plan. Discharge instructions discussed at great length. Strict return precautions discussed and pt &/or family have verbalized understanding of the instructions. No further questions at time of discharge.    Discharge Medication List as of 08/27/2022  7:04 PM      Follow Up: Luetta Nutting, [facility] [address] [address] [place] [address] [id number] mc [place]        Jemia Fata, Gwenyth Allegra, MD 08/27/22 [phone]

## 2022-08-28 LAB — URINE CULTURE: Culture: NO GROWTH

## 2022-09-01 LAB — CULTURE, BLOOD (ROUTINE X 2)
Culture: NO GROWTH
Special Requests: ADEQUATE

## 2022-09-10 ENCOUNTER — Inpatient Hospital Stay: Payer: Medicare Other

## 2022-09-10 ENCOUNTER — Inpatient Hospital Stay: Payer: Medicare Other | Admitting: Hematology

## 2022-09-10 DIAGNOSIS — R0683 Snoring: Secondary | ICD-10-CM | POA: Diagnosis not present

## 2022-09-10 DIAGNOSIS — G4730 Sleep apnea, unspecified: Secondary | ICD-10-CM | POA: Diagnosis not present

## 2022-09-18 ENCOUNTER — Other Ambulatory Visit: Payer: Medicare Other

## 2022-09-18 ENCOUNTER — Ambulatory Visit: Payer: Medicare Other | Admitting: Hematology

## 2022-09-22 ENCOUNTER — Telehealth: Payer: Self-pay | Admitting: Cardiology

## 2022-09-22 NOTE — Telephone Encounter (Signed)
Pt aware of pulse ox results .Kayla Bennett

## 2022-09-22 NOTE — Telephone Encounter (Signed)
Patient was returning phone call about results 

## 2022-09-22 NOTE — Telephone Encounter (Signed)
Leta Jungling, MD 09/18/2022  5:50 PM EDT     No significant O2 desats on CPAP and O2

## 2022-09-24 ENCOUNTER — Encounter: Payer: Self-pay | Admitting: Hematology

## 2022-09-24 ENCOUNTER — Inpatient Hospital Stay: Payer: Medicare Other | Attending: Hematology

## 2022-09-24 ENCOUNTER — Inpatient Hospital Stay (HOSPITAL_BASED_OUTPATIENT_CLINIC_OR_DEPARTMENT_OTHER): Payer: Medicare Other | Admitting: Hematology

## 2022-09-24 VITALS — BP 128/67 | HR 80 | Temp 98.0°F | Resp 18 | Ht 61.0 in | Wt 199.5 lb

## 2022-09-24 DIAGNOSIS — M25511 Pain in right shoulder: Secondary | ICD-10-CM | POA: Diagnosis not present

## 2022-09-24 DIAGNOSIS — Z170 Estrogen receptor positive status [ER+]: Secondary | ICD-10-CM | POA: Insufficient documentation

## 2022-09-24 DIAGNOSIS — E538 Deficiency of other specified B group vitamins: Secondary | ICD-10-CM | POA: Diagnosis not present

## 2022-09-24 DIAGNOSIS — R7401 Elevation of levels of liver transaminase levels: Secondary | ICD-10-CM | POA: Insufficient documentation

## 2022-09-24 DIAGNOSIS — M25512 Pain in left shoulder: Secondary | ICD-10-CM | POA: Insufficient documentation

## 2022-09-24 DIAGNOSIS — E1140 Type 2 diabetes mellitus with diabetic neuropathy, unspecified: Secondary | ICD-10-CM | POA: Insufficient documentation

## 2022-09-24 DIAGNOSIS — C50411 Malignant neoplasm of upper-outer quadrant of right female breast: Secondary | ICD-10-CM | POA: Insufficient documentation

## 2022-09-24 DIAGNOSIS — I10 Essential (primary) hypertension: Secondary | ICD-10-CM | POA: Insufficient documentation

## 2022-09-24 DIAGNOSIS — Z79810 Long term (current) use of selective estrogen receptor modulators (SERMs): Secondary | ICD-10-CM | POA: Diagnosis not present

## 2022-09-24 DIAGNOSIS — G8929 Other chronic pain: Secondary | ICD-10-CM | POA: Insufficient documentation

## 2022-09-24 DIAGNOSIS — Z90710 Acquired absence of both cervix and uterus: Secondary | ICD-10-CM | POA: Diagnosis not present

## 2022-09-24 DIAGNOSIS — M25559 Pain in unspecified hip: Secondary | ICD-10-CM | POA: Insufficient documentation

## 2022-09-24 DIAGNOSIS — Z923 Personal history of irradiation: Secondary | ICD-10-CM | POA: Diagnosis not present

## 2022-09-24 DIAGNOSIS — D509 Iron deficiency anemia, unspecified: Secondary | ICD-10-CM | POA: Diagnosis not present

## 2022-09-24 DIAGNOSIS — M25562 Pain in left knee: Secondary | ICD-10-CM | POA: Insufficient documentation

## 2022-09-24 LAB — VITAMIN B12: Vitamin B-12: 713 pg/mL (ref 180–914)

## 2022-09-24 LAB — CBC WITH DIFFERENTIAL (CANCER CENTER ONLY)
Abs Immature Granulocytes: 0.01 10*3/uL (ref 0.00–0.07)
Basophils Absolute: 0 10*3/uL (ref 0.0–0.1)
Basophils Relative: 1 %
Eosinophils Absolute: 0.1 10*3/uL (ref 0.0–0.5)
Eosinophils Relative: 2 %
HCT: 34.3 % — ABNORMAL LOW (ref 36.0–46.0)
Hemoglobin: 11.5 g/dL — ABNORMAL LOW (ref 12.0–15.0)
Immature Granulocytes: 0 %
Lymphocytes Relative: 34 %
Lymphs Abs: 1.4 10*3/uL (ref 0.7–4.0)
MCH: 32.3 pg (ref 26.0–34.0)
MCHC: 33.5 g/dL (ref 30.0–36.0)
MCV: 96.3 fL (ref 80.0–100.0)
Monocytes Absolute: 0.5 10*3/uL (ref 0.1–1.0)
Monocytes Relative: 11 %
Neutro Abs: 2.1 10*3/uL (ref 1.7–7.7)
Neutrophils Relative %: 52 %
Platelet Count: 154 10*3/uL (ref 150–400)
RBC: 3.56 MIL/uL — ABNORMAL LOW (ref 3.87–5.11)
RDW: 13.3 % (ref 11.5–15.5)
WBC Count: 4.2 10*3/uL (ref 4.0–10.5)
nRBC: 0 % (ref 0.0–0.2)

## 2022-09-24 LAB — FERRITIN: Ferritin: 248 ng/mL (ref 11–307)

## 2022-09-24 NOTE — Progress Notes (Signed)
[place]   [phone] Fax:([phone]) [phone]   Clinic Follow up Note   Patient Care Team: Luetta Nutting, DO as PCP - General (Family Medicine) Freada Bergeron, MD as PCP - Cardiology (Cardiology) Constance Haw, MD as PCP - Electrophysiology (Cardiology) Sueanne Margarita, MD as PCP - Sleep Medicine (Cardiology) [name], MD as Consulting Physician (General Surgery) Truitt Merle, MD as Consulting Physician (Hematology) Eppie Gibson, MD as Attending Physician (Radiation Oncology) Sharmon Revere as Physician Assistant (Cardiology) Alla Feeling, NP as Nurse Practitioner (Nurse Practitioner)  Date of Service:  09/24/2022  CHIEF COMPLAINT: f/u of left breast cancer  CURRENT THERAPY:  Tamoxifen, 25m, starting 05/2021  ASSESSMENT & PLAN:  Kayla KOZUBis a 71y.o. post-hysterectomy female with   1. Malignant neoplasm of upper-outer quadrant of right breast, Stage IA, (pT1c, pN0), ER+/PR+/HER2-, Grade II  -found on screening mammogram. S/p right lumpectomy on 02/18/21 under Dr. BBarry Dienes path showed 1.2 cm IDC with DCIS. Margins and lymph nodes negative -Oncotype DX score of 0. -s/p radiation under Dr. SIsidore Moos5/24/22 - 05/07/21. -she started tamoxifen in 05/2021. She is tolerating well except some vaginal dryness/discharge. I encouraged her to discuss with her PCP for recommendations of vaginal lubricants. -[facility] -she is clinically doing well. Labs reviewed, CBC overall stable. Physical exam was unremarkable. There is no clinical concern for recurrence. -Labs and follow up with [place] 6 months    2. Anemia, B12 and iron deficiencies -pt is s/p hysterectomy, last colonoscopy benign per pt (recall due 2025) -Received IV Feraheme during hospitalization on 10/14/21 -last iron panel, ferritin, and B12 on 02/21/22 were WNL. -hgb fluctuates, 11.5 today (09/24/22). She endorses taking B12, oral iron,  and prenatal vitamin. -continue monitoring   3. Transaminitis -acute elevation of AST and ALT seen on admission on 08/21/22. CT AP was negative. -likely secondary to her acute UTI or a medication.  4. Comorbidities: Chronic joint pain, DM, HTN, fibromyalgia, GERD, HLD, osteopenia, Neuropathy -her joint pain is in b/l shoulders, left knee and hip with very limited ROM of left shoulder. -Most recent DEXA scan 08/21/21 showed T-score -1.3. -She is taking a vitamin D supplement but not calcium. I encouraged her to start calcium given recent low levels.     PLAN:  -continue tamoxifen -lab and f/u with NP Lacie in 6 months -I wrote a letter for her jury duty waiver   No problem-specific Assessment & Plan notes found for this encounter.   SUMMARY OF ONCOLOGIC HISTORY: Oncology History Overview Note  Cancer Staging Malignant neoplasm of upper-outer quadrant of right breast in female, estrogen receptor positive ([place] Staging form: Breast, AJCC 8th Edition - Clinical stage from 11/22/2020: Stage IA (cT1b, cN0, cM0, G2, ER+, PR+, HER2-) - Signed by FTruitt Merle MD on 12/04/2020 Stage prefix: Initial diagnosis - Pathologic: Stage IA (pT1c, pN0, cM0, G2, ER+, PR+, HER2-) - Signed by CGardenia Phlegm NP on 02/27/2021 Histologic grading system: 3 grade system    Malignant neoplasm of upper-outer quadrant of right breast in female, estrogen receptor positive ([place]  11/05/2020 Breast [place] 11/05/20  IMPRESSION No Sonographic correlate is seen for the round microlobulated 943mmass in the upper outer right breast 10cm from the nipple, 9: 00 position.  Given this mass is new on mammography and has suspicious borders, a stereotactic guided biopsy is recommended.     11/22/2020 Cancer Staging  Staging form: Breast, AJCC 8th Edition - Clinical stage from 11/22/2020: Stage IA (cT1b, cN0, cM0, G2, ER+, PR+, HER2-) - Signed by Truitt Merle, MD on 12/04/2020   11/22/2020 Initial Biopsy     Diagnosis 11/22/20  Breast, right, needle core biopsy, 10 cmfn, upper outer quadrant, post depth - INVASIVE DUCTAL CARCINOMA. SEE NOTE Diagnosis Note Carcinoma measures 0.7 cm in greatest linear dimension and appears grade 2. Dr. Tresa Moore reviewed the case and concurs with the diagnosis. A breast prognostic profile (ER, PR, Ki-67 and HER2) is pending and will be reported in an addendum. Dr. Luan Pulling was notified on 11/23/2020.   11/22/2020 Receptors her2    ROGNOSTIC INDICATORS Results: IMMUNOHISTOCHEMICAL AND MORPHOMETRIC ANALYSIS PERFORMED MANUALLY The tumor cells are NEGATIVE for Her2 (1+). Estrogen Receptor: 95%, POSITIVE, STRONG STAINING INTENSITY Progesterone Receptor: 95%, POSITIVE, STRONG STAINING INTENSITY Proliferation Marker Ki67: 10%   11/28/2020 Initial Diagnosis   Malignant neoplasm of upper-outer quadrant of right breast in female, estrogen receptor positive ([place])   12/22/2020 Genetic Testing   Negative hereditary cancer genetic testing: no pathogenic variants detected in [facility] Common Hereditary Cancers Panel + Melanoma Genes.  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  The report date is December 22, 2020.    The Common Hereditary Cancers Panel+Melanoma Genes offered by [facility] includes sequencing and/or deletion duplication testing of the following 52 genes: APC, ATM, AXIN2, BAP1, BARD1, BMPR1A, BRCA1, BRCA2, BRIP1, CDH1, CDK4, CDKN2A (p14ARF), CDKN2A (p16INK4a), CHEK2, CTNNA1, DICER1, EPCAM (Deletion/duplication testing only), GREM1 (promoter region deletion/duplication testing only), HOXB13, KIT, MEN1, MITF, MLH1, MSH2, MSH3, MSH6, MUTYH, NBN, NF1, NHTL1, PALB2, PDGFRA, PMS2, POLD1, POLE, PTEN, POT1, RAD50, RAD51C, RAD51D, RB1, RNF43, SDHA, SDHB, SDHC, SDHD, SMAD4, SMARCA4, STK11, TP53, TSC1, TSC2, and VHL.  The following genes were evaluated for sequence changes only: SDHA and HOXB13 c.251G>A variant only.   02/18/2021 Surgery   FINAL  MICROSCOPIC DIAGNOSIS:   A. BREAST, RIGHT, LUMPECTOMY:  - Invasive ductal carcinoma, 1.2 cm, grade 2  - Ductal carcinoma in situ, intermediate grade  - Resection margins are negative for carcinoma; posterior margin is less than 1 mm from carcinoma  - Biopsy site changes  - See oncology table   B. LYMPH NODE, RIGHT AXILLARY #1, SENTINEL, EXCISION:  - Lymph node, negative for carcinoma (0/1)   C. LYMPH NODE, RIGHT AXILLARY #2, SENTINEL, EXCISION:  - Lymph node, negative for carcinoma (0/1)   D. LYMPH NODE, RIGHT AXILLARY, SENTINEL, EXCISION:  - Lymph node, negative for carcinoma (0/1)   E. LYMPH NODE, RIGHT AXILLARY #3, SENTINEL, EXCISION:  - Lymph node, negative for carcinoma (0/1)   F. LYMPH NODE, RIGHT AXILLARY, SENTINEL, EXCISION:  - Lymph node, negative for carcinoma (0/1)     02/18/2021 Oncotype testing   Oncotype DX was obtained on the final surgical sample and the recurrence score of 0 predicts a risk of recurrence outside the breast over the next 9 years of 3%, if the patient's only systemic therapy is an antiestrogen for 5 years.  It also predicts no benefit from chemotherapy.   02/27/2021 Cancer Staging   Staging form: Breast, AJCC 8th Edition - Pathologic: Stage IA (pT1c, pN0, cM0, G2, ER+, PR+, HER2-) - Signed by Gardenia Phlegm, NP on 02/27/2021 Histologic grading system: 3 grade system   03/2021 - 04/2021 Radiation Therapy   Completed adjuvant Radiation by Dr. Isidore Moos   05/2021 -  Neo-Adjuvant Anti-estrogen oral therapy   Began tamoxifen mid July 2022   07/29/2021 Survivorship  SCP delivered by Cira Rue, NP      INTERVAL HISTORY:  Kayla Bennett is here for a follow up of breast cancer. She was last seen by me on 02/21/22. She presents to the clinic alone. She tells me she got Covid in early 08/2022. She reports her energy has not recovered completely, about 70-75%. She notes she has experienced some balance issues that fluctuate. She denies any side  effects from tamoxifen, but she does report feeling "raw" in the vaginal area. She notes she also experiences discharge, denies itching.  She also reports joint pain, maybe slightly worse on tamoxifen, but she does have fibromyalgia.   All other systems were reviewed with the patient and are negative.  MEDICAL HISTORY:  Past Medical History:  Diagnosis Date   Allergy 1978   Food allergies, seasonal allergies, medicine allergies   Anemia    Arthritis    Asthma    Blood transfusion without reported diagnosis 1985   Breast cancer ([place])    Diabetes ([place])    Dyspnea    Dysrhythmia    afibb on occasion   Family history of adverse reaction to anesthesia    mother had reaction to versed...during surgery, stopped breathing   Fibromyalgia    GERD (gastroesophageal reflux disease)    Headache    History of hiatal hernia    Hyperlipidemia 1990?   Hypertension    Migraines    Osteopenia    Osteoporosis    Oxygen deficiency 2023   Psoriasis    Sleep apnea 2023   Yeast infection 10/30/2021    SURGICAL HISTORY: Past Surgical History:  Procedure Laterality Date   ABDOMINAL HYSTERECTOMY     APPENDECTOMY     ATRIAL FIBRILLATION ABLATION N/A 12/10/2021   Procedure: ATRIAL FIBRILLATION ABLATION;  Surgeon: Constance Haw, MD;  Location: [facility];  Service: Cardiovascular;  Laterality: N/A;   BREAST LUMPECTOMY WITH RADIOACTIVE SEED AND SENTINEL LYMPH NODE BIOPSY Right 02/18/2021   Procedure: RIGHT BREAST LUMPECTOMY WITH RADIOACTIVE SEED AND SENTINEL LYMPH NODE BIOPSY;  Surgeon: [name], MD;  Location: [facility];  Service: General;  Laterality: Right;   BREAST SURGERY  2022   broken wrist     CARDIOVERSION N/A 04/04/2021   Procedure: CARDIOVERSION;  Surgeon: Sueanne Margarita, MD;  Location: [facility];  Service: Cardiovascular;  Laterality: N/A;   CARDIOVERSION N/A 08/14/2021   Procedure: CARDIOVERSION;  Surgeon: Geralynn Rile, MD;  Location: [place];   Service: Cardiovascular;  Laterality: N/A;   CARDIOVERSION N/A 09/19/2021   Procedure: CARDIOVERSION;  Surgeon: Geralynn Rile, MD;  Location: [place] [place];  Service: Cardiovascular;  Laterality: N/A;   [place]     right wrist   HYSTERECTOMY ABDOMINAL WITH SALPINGO-OOPHORECTOMY     LYSIS OF ADHESION     SHOULDER SURGERY Left    TUBAL LIGATION      I have reviewed the social history and family history with the patient and they are unchanged from previous note.  ALLERGIES:  is allergic to lotensin [benazepril hcl], morphine, erythromycin base, hydrocodone, other, banana, chocolate, gluten meal, tizanidine, and wound dressing adhesive.  MEDICATIONS:  Current Outpatient Medications  Medication Sig Dispense Refill   acetaminophen (TYLENOL) 500 MG tablet Take 500 mg by mouth every 6 (six) hours as needed for moderate pain, headache or mild pain.     apixaban (ELIQUIS) 5 MG TABS tablet Take 1 tablet (  5 mg total) by mouth 2 (two) times daily. Pt needs 90 day supply.  Please keep on file till next refill. (Patient taking differently: Take 5 mg by mouth 2 (two) times daily.) 180 tablet 3   calcipotriene (DOVONOX) 0.005 % cream Apply 1 application topically See admin instructions. Mix with fluorouracil 5% cream and apply topically twice daily for five days - as needed for precancerous on right cheek.     cetirizine (ZYRTEC) 10 MG tablet Take 10 mg by mouth in the morning.     cholecalciferol (VITAMIN D3) 25 MCG (1000 UNIT) tablet Take 1,000-2,000 Units by mouth See admin instructions. Take 1000 units in the morning and 2000 units at bedtime     dicyclomine (BENTYL) 10 MG capsule Take 1 capsule (10 mg total) by mouth 3 (three) times daily as needed for spasms. 20 capsule 0   ferrous sulfate 325 (65 FE) MG tablet Take 325 mg by mouth every morning.     fluorouracil (EFUDEX) 5 % cream Apply 1 application  topically 2 (two) times daily  as needed (precancerous spots on right temple and left eyebrow). Mix with calcipotriene 0.005% cream and apply topically twice daily for five days - as needed for precancerous on right temple and left eyebrow     gabapentin (NEURONTIN) 100 MG capsule Take 200 mg by mouth at bedtime.     loperamide (IMODIUM A-D) 2 MG tablet Take 1 tablet (2 mg total) by mouth 3 (three) times daily as needed for diarrhea or loose stools (also available OTC). 30 tablet 0   losartan-hydrochlorothiazide (HYZAAR) 100-25 MG tablet Take 1 tablet by mouth daily. Hold for 2 days 30 tablet 6   metFORMIN (GLUCOPHAGE-XR) 500 MG 24 hr tablet TAKE ONE TABLET BY MOUTH EVERY DAY (Patient taking differently: Take 500 mg by mouth at bedtime.) 90 tablet 3   metoprolol succinate (TOPROL-XL) 50 MG 24 hr tablet Take 1 tablet (50 mg total) by mouth daily. Take with or immediately following a meal. 90 tablet 2   ondansetron (ZOFRAN) 4 MG tablet Take 1 tablet (4 mg total) by mouth every 8 (eight) hours as needed for nausea or vomiting. 20 tablet 0   pantoprazole (PROTONIX) 40 MG tablet Take 40 mg by mouth 2 (two) times daily before a meal.     potassium chloride SA (KLOR-CON M20) 20 MEQ tablet Take 1 tablet (20 mEq total) by mouth daily. 30 tablet 6   Prenatal Vit-Fe Fumarate-FA (PRENATAL VITAMIN PLUS LOW IRON PO) Take 1 tablet by mouth in the morning.     tamoxifen (NOLVADEX) 20 MG tablet Take 1 tablet (20 mg total) by mouth every morning. 30 tablet 3   triamcinolone (KENALOG) 0.1 % Apply 1 application topically 2 (two) times daily as needed (psoriasis). Mixed with Cetaphil 1:1     UNABLE TO FIND OUTPATIENT PHYSICAL THERAPY    Diagnosis: Generalized debility due to UTI, acute gastroenteritis 1 Mutually Defined 0   venlafaxine XR (EFFEXOR-XR) 37.5 MG 24 hr capsule Take 37.5-75 mg by mouth See admin instructions. Take 1 capsule (75 mg) + 1 capsule (37.5 mg) to equal (112.5 mg) at bedtime     vitamin C (ASCORBIC ACID) 500 MG tablet Take 500 mg  by mouth daily.     No current facility-administered medications for this visit.    PHYSICAL EXAMINATION: ECOG PERFORMANCE STATUS: 1 - Symptomatic but completely ambulatory  Vitals:   09/24/22 0818  BP: 128/67  Pulse: 80  Resp: 18  Temp: 98 F (  36.7 C)  SpO2: 98%   Wt Readings from Last 3 Encounters:  09/24/22 199 lb 8 oz (90.5 kg)  08/27/22 199 lb (90.3 kg)  08/21/22 199 lb (90.3 kg)     GENERAL:alert, no distress and comfortable SKIN: skin color, texture, turgor are normal, no rashes or significant lesions EYES: normal, Conjunctiva are pink and non-injected, sclera clear  NECK: supple, thyroid normal size, non-tender, without nodularity LYMPH:  no palpable lymphadenopathy in the cervical, axillary LUNGS: clear to auscultation and percussion with normal breathing effort HEART: regular rate & rhythm and no murmurs and no lower extremity edema ABDOMEN:abdomen soft, non-tender and normal bowel sounds Musculoskeletal:no cyanosis of digits and no clubbing  NEURO: alert & oriented x 3 with fluent speech, no focal motor/sensory deficits BREAST: No palpable mass, nodules or adenopathy bilaterally. Breast exam benign.   LABORATORY DATA:  I have reviewed the data as listed    Latest Ref Rng & Units 09/24/2022    7:59 AM 08/27/2022   10:08 AM 08/21/2022    7:45 PM  CBC  WBC 4.0 - 10.5 K/uL 4.2  5.2  4.7   Hemoglobin 12.0 - 15.0 g/dL 11.5  10.9  12.2   Hematocrit 36.0 - 46.0 % 34.3  32.2  36.3   Platelets 150 - 400 K/uL 154  193  146         Latest Ref Rng & Units 08/27/2022   10:08 AM 08/23/2022    8:14 AM 08/22/2022    4:29 AM  CMP  Glucose 70 - 99 mg/dL 123  133  136   BUN 8 - 23 mg/dL _0 Creatinine 0.44 - 1.00 mg/dL 0.75  0.64  0.66   Sodium 135 - 145 mmol/L 139  138  140   Potassium 3.5 - 5.1 mmol/L 3.5  4.0  3.3   Chloride 98 - 111 mmol/L 103  106  106   CO2 22 - 32 mmol/L _1 Calcium 8.9 - 10.3 mg/dL 8.8  8.0  8.3   Total Protein 6.5 - 8.1 g/dL  6.6     Total Bilirubin 0.3 - 1.2 mg/dL 0.5     Alkaline Phos 38 - 126 U/L 60     AST 15 - 41 U/L 117     ALT 0 - 44 U/L 73         RADIOGRAPHIC STUDIES: I have personally reviewed the radiological images as listed and agreed with the findings in the report. No results found.    Orders Placed This Encounter  Procedures   Comprehensive metabolic panel    Standing Status:   Standing    Number of Occurrences:   50    Standing Expiration Date:   09/25/2023   All questions were answered. The patient knows to call the clinic with any problems, questions or concerns. No barriers to learning was detected. The total time spent in the appointment was 30 minutes.     Truitt Merle, MD 09/24/2022   I, Wilburn Mylar, am acting as scribe for Truitt Merle, MD.   I have reviewed the above documentation for accuracy and completeness, and I agree with the above.

## 2022-09-25 ENCOUNTER — Telehealth: Payer: Self-pay

## 2022-09-25 NOTE — Patient Outreach (Signed)
  Care Coordination   09/25/2022 Name: Kayla Bennett MRN: [id number] DOB: [birth year]   Care Coordination Outreach Attempts:  An unsuccessful telephone outreach was attempted today to offer the patient information about available care coordination services as a benefit of their health plan.   Follow Up Plan:  Additional outreach attempts will be made to offer the patient care coordination information and services.   Encounter Outcome:  No Answer  Care Coordination Interventions Activated:  No   Care Coordination Interventions:  No, not indicated    Tomasa Rand, RN, BSN, CEN [facility] [employer] [phone]

## 2022-09-26 ENCOUNTER — Telehealth: Payer: Self-pay

## 2022-09-26 ENCOUNTER — Other Ambulatory Visit (HOSPITAL_COMMUNITY): Payer: Self-pay

## 2022-09-26 MED ORDER — POTASSIUM CHLORIDE CRYS ER 20 MEQ PO TBCR: 20.0000 meq | EXTENDED_RELEASE_TABLET | Freq: Every day | ORAL | 6 refills | Status: DC

## 2022-09-26 NOTE — Patient Outreach (Signed)
  Care Coordination   09/26/2022 Name: ARFA LAMARCA MRN: [id number] DOB: September 08, 1951   Care Coordination Outreach Attempts:  A second unsuccessful outreach was attempted today to offer the patient with information about available care coordination services as a benefit of their health plan.     Follow Up Plan:  Additional outreach attempts will be made to offer the patient care coordination information and services.   Encounter Outcome:  No Answer  Care Coordination Interventions Activated:  No   Care Coordination Interventions:  No, not indicated    Tomasa Rand, RN, BSN, CEN [facility] [employer] [phone]

## 2022-09-29 ENCOUNTER — Ambulatory Visit: Payer: Medicare Other | Admitting: Family Medicine

## 2022-10-01 DIAGNOSIS — K136 Irritative hyperplasia of oral mucosa: Secondary | ICD-10-CM | POA: Diagnosis not present

## 2022-10-08 ENCOUNTER — Ambulatory Visit (INDEPENDENT_AMBULATORY_CARE_PROVIDER_SITE_OTHER): Payer: Medicare Other | Admitting: Family Medicine

## 2022-10-08 ENCOUNTER — Encounter: Payer: Self-pay | Admitting: Family Medicine

## 2022-10-08 VITALS — BP 116/72 | HR 78 | Ht 61.0 in | Wt 201.0 lb

## 2022-10-08 DIAGNOSIS — I4819 Other persistent atrial fibrillation: Secondary | ICD-10-CM | POA: Diagnosis not present

## 2022-10-08 DIAGNOSIS — I10 Essential (primary) hypertension: Secondary | ICD-10-CM

## 2022-10-08 DIAGNOSIS — A09 Infectious gastroenteritis and colitis, unspecified: Secondary | ICD-10-CM

## 2022-10-08 DIAGNOSIS — A419 Sepsis, unspecified organism: Secondary | ICD-10-CM

## 2022-10-08 DIAGNOSIS — Z23 Encounter for immunization: Secondary | ICD-10-CM | POA: Diagnosis not present

## 2022-10-08 DIAGNOSIS — E119 Type 2 diabetes mellitus without complications: Secondary | ICD-10-CM

## 2022-10-08 LAB — POCT GLYCOSYLATED HEMOGLOBIN (HGB A1C): HbA1c, POC (controlled diabetic range): 5.9 % (ref 0.0–7.0)

## 2022-10-08 NOTE — Assessment & Plan Note (Signed)
Had significant diarrhea during her previous hospitalization.  This is resolved at this point.  Updating electrolytes and CBC.

## 2022-10-08 NOTE — Assessment & Plan Note (Signed)
Lab Results  Component Value Date   HGBA1C 5.9 10/08/2022  Diabetes remains well controlled.  Continue metformin at current strength.

## 2022-10-08 NOTE — Progress Notes (Signed)
Kayla Bennett - 71 y.o. female MRN [id number]  Date of birth: 03-Jan-1951  Subjective Chief Complaint  Patient presents with   Hospitalization Follow-up    HPI Kayla Bennett  is a 71 y.o. female here today for hospital follow-up.  She was admitted to hospital from 10/5 to 08/23/2022.  Initially had multiple episodes of watery diarrhea with fever.  Additionally she has some nausea without vomiting and family noted that she was more confused.  In the ED she did have elevated lactic acid with some hypokalemia.  COVID testing was negative UA consistent with UTI.  She was hydrated aggressively.  Diarrhea resolved during hospitalization.  Urine culture with multiple species.  Initially treated with IV Rocephin and transition to oral Keflex prior to discharge to complete course.  Her mental status returned to baseline prior to discharge.  A few days later after discharge she returned to the emergency department with fever along with cough and myalgias.  She did test positive for COVID at this time.  She has recovered from this at this time.  She denies any continued residual symptoms.  Reports that overall she is feeling pretty well at this time.  Her other chronic conditions have remained well controlled.  She continues to see cardiology regularly.  Denies any chest pain, shortness of breath, palpitations.    ROS:  A comprehensive ROS was completed and negative except as noted per HPI  Allergies  Allergen Reactions   Lotensin [Benazepril Hcl] Swelling   Morphine Itching and Rash   Erythromycin Base Other (See Comments)    Unknown reaction   Hydrocodone Other (See Comments)    Unknown reaction     Other Swelling    Mustard - vaginal swelling/pain    Banana Swelling    vaginal swelling/pain   Chocolate Swelling    vaginal swelling/pain   Gluten Meal Diarrhea and Other (See Comments)    Upset stomach    Tizanidine Other (See Comments)    Lowered blood pressure    Wound Dressing  Adhesive Other (See Comments)    Skin burns, blisters     Past Medical History:  Diagnosis Date   Allergy 1978   Food allergies, seasonal allergies, medicine allergies   Anemia    Arthritis    Asthma    Blood transfusion without reported diagnosis 1985   Breast cancer ([place])    Diabetes ([place])    Dyspnea    Dysrhythmia    afibb on occasion   Family history of adverse reaction to anesthesia    mother had reaction to versed...during surgery, stopped breathing   Fibromyalgia    GERD (gastroesophageal reflux disease)    Headache    History of hiatal hernia    Hyperlipidemia 1990?   Hypertension    Migraines    Osteopenia    Osteoporosis    Oxygen deficiency 2023   Psoriasis    Sleep apnea 2023   Yeast infection 10/30/2021    Past Surgical History:  Procedure Laterality Date   ABDOMINAL HYSTERECTOMY     APPENDECTOMY     ATRIAL FIBRILLATION ABLATION N/A 12/10/2021   Procedure: ATRIAL FIBRILLATION ABLATION;  Surgeon: Constance Haw, MD;  Location: [facility];  Service: Cardiovascular;  Laterality: N/A;   BREAST LUMPECTOMY WITH RADIOACTIVE SEED AND SENTINEL LYMPH NODE BIOPSY Right 02/18/2021   Procedure: RIGHT BREAST LUMPECTOMY WITH RADIOACTIVE SEED AND SENTINEL LYMPH NODE BIOPSY;  Surgeon: [name], MD;  Location: [facility];  Service: General;  Laterality: Right;   BREAST SURGERY  2022   broken wrist     CARDIOVERSION N/A 04/04/2021   Procedure: CARDIOVERSION;  Surgeon: Sueanne Margarita, MD;  Location: [facility];  Service: Cardiovascular;  Laterality: N/A;   CARDIOVERSION N/A 08/14/2021   Procedure: CARDIOVERSION;  Surgeon: Geralynn Rile, MD;  Location: [facility];  Service: Cardiovascular;  Laterality: N/A;   CARDIOVERSION N/A 09/19/2021   Procedure: CARDIOVERSION;  Surgeon: Geralynn Rile, MD;  Location: [facility];  Service: Cardiovascular;  Laterality: N/A;   [place]      right wrist   HYSTERECTOMY ABDOMINAL WITH SALPINGO-OOPHORECTOMY     LYSIS OF ADHESION     SHOULDER SURGERY Left    TUBAL LIGATION      Social History   Socioeconomic History   Marital status: Married    Spouse name: Not on file   Number of children: 2   Years of education: Not on file   Highest education level: Not on file  Occupational History   Not on file  Tobacco Use   Smoking status: Never   Smokeless tobacco: Never  Vaping Use   Vaping Use: Never used  Substance and Sexual Activity   Alcohol use: Never   Drug use: Never   Sexual activity: Not Currently  Other Topics Concern   Not on file  Social History Narrative   Not on file   Social Determinants of Health   Financial Resource Strain: Not on file  Food Insecurity: No Food Insecurity (08/22/2022)   Hunger Vital Sign    Worried About Running Out of Food in the Last Year: Never true    Ran Out of Food in the Last Year: Never true  Transportation Needs: No Transportation Needs (08/22/2022)   PRAPARE - Hydrologist (Medical): No    Lack of Transportation (Non-Medical): No  Physical Activity: Not on file  Stress: Not on file  Social Connections: Not on file    Family History  Problem Relation Age of Onset   Atrial fibrillation Mother    Heart attack Mother 58   Arthritis Mother    Cancer Mother    Diabetes Mother    Heart disease Mother    Hypertension Mother    Colon cancer Father 5   Heart attack Father 95   Cancer Father    Early death Father    Heart disease Father    Hypertension Father    Heart attack Paternal Grandmother 46   Cancer Maternal Uncle        melanoma    Colon cancer Paternal Uncle    Cancer Paternal Uncle    Diabetes Paternal Uncle    Heart disease Paternal Uncle    Obesity Paternal Uncle    Colon cancer Cousin    Atrial fibrillation Cousin    Stroke Maternal Grandmother    Stroke Paternal Grandfather    Arthritis Maternal Aunt    Diabetes  Maternal Aunt    Hearing loss Maternal Aunt    Cancer Paternal Uncle    Heart disease Paternal Uncle    Obesity Paternal Uncle    Diabetes Maternal Aunt    Diabetes Maternal Aunt    Heart disease Maternal Aunt    Diabetes Maternal Uncle    Hearing loss Maternal Uncle    Diabetes Paternal Aunt    Hearing loss Paternal Aunt    Diabetes Sister  Hyperlipidemia Sister    Hypertension Sister    Diabetes Paternal Uncle    Hearing loss Paternal Uncle    Diabetes Paternal Uncle    Hearing loss Paternal Uncle    Heart disease Paternal Uncle    Hypertension Paternal Uncle    Diabetes Paternal Uncle    Stroke Maternal Uncle     Health Maintenance  Topic Date Due   Diabetic kidney evaluation - Urine ACR  10/17/2022 (Originally 09/03/2021)   COVID-19 Vaccine (3 - 2023-24 season) 12/18/2022 (Originally 07/18/2022)   Medicare Annual Wellness (AWV)  12/18/2022 (Originally 08/14/2021)   Zoster Vaccines- Shingrix (1 of 2) 01/08/2023 (Originally 02/17/1970)   Pneumonia Vaccine 83+ Years old (1 - PCV) 06/26/2023 (Originally 02/18/2016)   COLONOSCOPY (Pts 45-19yr Insurance coverage will need to be confirmed)  06/26/2023 (Originally 02/18/1996)   Hepatitis C Screening  06/26/2023 (Originally 02/17/1969)   MAMMOGRAM  11/05/2022   HEMOGLOBIN A1C  04/08/2023   OPHTHALMOLOGY EXAM  06/06/2023   FOOT EXAM  06/26/2023   Diabetic kidney evaluation - GFR measurement  08/28/2023   INFLUENZA VACCINE  Completed   DEXA SCAN  Completed   HPV VACCINES  Aged Out     ----------------------------------------------------------------------------------------------------------------------------------------------------------------------------------------------------------------- Physical Exam BP 116/72 (BP Location: Left Arm, Patient Position: Sitting, Cuff Size: Large)   Pulse 78   Ht '5\' 1"'$  (1.549 m)   Wt 201 lb (91.2 kg)   SpO2 97%   BMI 37.98 kg/m   Physical Exam HENT:     Head: Normocephalic and atraumatic.   Eyes:     General: No scleral icterus. Cardiovascular:     Rate and Rhythm: Normal rate and regular rhythm.  Pulmonary:     Effort: Pulmonary effort is normal.     Breath sounds: Normal breath sounds.  Neurological:     Mental Status: She is alert.  Psychiatric:        Mood and Affect: Mood normal.        Behavior: Behavior normal.     ------------------------------------------------------------------------------------------------------------------------------------------------------------------------------------------------------------------- Assessment and Plan  Hypertension Blood pressure remains well controlled.  She will continue current medications for management of hypertension.  Persistent atrial fibrillation ([place] She has done well since ablation.  She does remain on Eliquis.  Sepsis ([place] Secondary to UTI.  This is resolved at this point.  Diarrhea Had significant diarrhea during her previous hospitalization.  This is resolved at this point.  Updating electrolytes and CBC.  Diabetes ([facility] Lab Results  Component Value Date   HGBA1C 5.9 10/08/2022  Diabetes remains well controlled.  Continue metformin at current strength.   No orders of the defined types were placed in this encounter.   Return in about 6 months (around 04/08/2023) for T2DM.    This visit occurred during the SARS-CoV-2 public health emergency.  Safety protocols were in place, including screening questions prior to the visit, additional usage of staff PPE, and extensive cleaning of exam room while observing appropriate contact time as indicated for disinfecting solutions.

## 2022-10-08 NOTE — Assessment & Plan Note (Signed)
Secondary to UTI.  This is resolved at this point.

## 2022-10-08 NOTE — Assessment & Plan Note (Signed)
She has done well since ablation.  She does remain on Eliquis.

## 2022-10-08 NOTE — Assessment & Plan Note (Signed)
Blood pressure remains well controlled.  She will continue current medications for management of hypertension.

## 2022-10-08 NOTE — Progress Notes (Deleted)
   Acute Office Visit  Subjective:     Patient ID: Kayla Bennett, female    DOB: Jul 05, 1951, 71 y.o.   MRN: [id number]  No chief complaint on file.   HPI Patient is in today for ***  ROS      Objective:    There were no vitals taken for this visit. {Vitals History (Optional):23777}  Physical Exam  No results found for any visits on 10/08/22.      Assessment & Plan:   Problem List Items Addressed This Visit       Cardiovascular and Mediastinum   Hypertension - Primary   Relevant Orders   COMPLETE METABOLIC PANEL WITH GFR   CBC with Differential    No orders of the defined types were placed in this encounter.   Return in about 6 months (around 04/08/2023) for T2DM.  Luetta Nutting, DO

## 2022-10-09 LAB — CBC WITH DIFFERENTIAL/PLATELET
Absolute Monocytes: 432 {cells}/uL (ref 200–950)
Basophils Absolute: 38 {cells}/uL (ref 0–200)
Basophils Relative: 0.8 %
Eosinophils Absolute: 101 {cells}/uL (ref 15–500)
Eosinophils Relative: 2.1 %
HCT: 34.7 % — ABNORMAL LOW (ref 35.0–45.0)
Hemoglobin: 11.8 g/dL (ref 11.7–15.5)
Lymphs Abs: 1565 {cells}/uL (ref 850–3900)
MCH: 31.9 pg (ref 27.0–33.0)
MCHC: 34 g/dL (ref 32.0–36.0)
MCV: 93.8 fL (ref 80.0–100.0)
MPV: 10 fL (ref 7.5–12.5)
Monocytes Relative: 9 %
Neutro Abs: 2664 {cells}/uL (ref 1500–7800)
Neutrophils Relative %: 55.5 %
Platelets: 182 Thousand/uL (ref 140–400)
RBC: 3.7 Million/uL — ABNORMAL LOW (ref 3.80–5.10)
RDW: 13 % (ref 11.0–15.0)
Total Lymphocyte: 32.6 %
WBC: 4.8 Thousand/uL (ref 3.8–10.8)

## 2022-10-09 LAB — COMPLETE METABOLIC PANEL WITHOUT GFR
AG Ratio: 1.4 (calc) (ref 1.0–2.5)
ALT: 39 U/L — ABNORMAL HIGH (ref 6–29)
AST: 74 U/L — ABNORMAL HIGH (ref 10–35)
Albumin: 4 g/dL (ref 3.6–5.1)
Alkaline phosphatase (APISO): 56 U/L (ref 37–153)
BUN: 17 mg/dL (ref 7–25)
CO2: 32 mmol/L (ref 20–32)
Calcium: 9 mg/dL (ref 8.6–10.4)
Chloride: 99 mmol/L (ref 98–110)
Creat: 0.74 mg/dL (ref 0.60–1.00)
Globulin: 2.8 g/dL (ref 1.9–3.7)
Glucose, Bld: 124 mg/dL — ABNORMAL HIGH (ref 65–99)
Potassium: 3.8 mmol/L (ref 3.5–5.3)
Sodium: 140 mmol/L (ref 135–146)
Total Bilirubin: 0.5 mg/dL (ref 0.2–1.2)
Total Protein: 6.8 g/dL (ref 6.1–8.1)
eGFR: 86 mL/min/1.73m2

## 2022-10-13 ENCOUNTER — Encounter: Payer: Self-pay | Admitting: Cardiology

## 2022-10-13 DIAGNOSIS — G4734 Idiopathic sleep related nonobstructive alveolar hypoventilation: Secondary | ICD-10-CM

## 2022-10-13 DIAGNOSIS — G4733 Obstructive sleep apnea (adult) (pediatric): Secondary | ICD-10-CM

## 2022-10-13 DIAGNOSIS — I10 Essential (primary) hypertension: Secondary | ICD-10-CM

## 2022-10-15 ENCOUNTER — Ambulatory Visit: Payer: Medicare Other | Attending: Cardiology | Admitting: Cardiology

## 2022-10-15 ENCOUNTER — Encounter: Payer: Self-pay | Admitting: Cardiology

## 2022-10-15 VITALS — BP 124/72 | HR 87 | Ht 61.5 in | Wt 203.2 lb

## 2022-10-15 DIAGNOSIS — I10 Essential (primary) hypertension: Secondary | ICD-10-CM | POA: Diagnosis not present

## 2022-10-15 DIAGNOSIS — G4733 Obstructive sleep apnea (adult) (pediatric): Secondary | ICD-10-CM

## 2022-10-15 NOTE — Progress Notes (Signed)
Sleep Medicine office note    Date:  10/15/2022   ID:  Kayla Bennett, DOB [birth year], MRN [id number]  PCP:  Luetta Nutting, DO  Cardiologist:  Gwyndolyn Kaufman, MD  Chief Complaint  Patient presents with   Sleep Apnea   Hypertension    History of Present Illness:  Kayla Bennett is a 71 y.o. female with a history of diabetes, atrial fibrillation, GERD, hypertension who was referred by Dr. Curt Bears for evaluation for possible obstructive sleep apnea as a cause of atrial fibrillation.  She underwent home sleep study on 11/18/2021 which showed severe obstructive sleep apnea with an AHI of 55.8/h, moderate central sleep apnea with AHIc of 27.5/h with 39% of the time Cheyne-Stokes respirations.  She had nocturnal hypoxemia with O2 sat nadir 71% and had persistent O2 desaturations less than 88% for over 1 hour.    She underwent CPAP titration on 01/29/2022 but due to ongoing respiratory events could not be adequately titrated and was started on BiPAP titration and an auto BiPAP with IPAP max 20 cm H2O, EPAP min 5 cm H2O and pressure support 4 cm H2O was ordered.    She is now back for follow-up.  At her last office visit I encouraged her to change her cushion now every 4 weeks given her mask leak.  I also added a chinstrap due to mouth dryness but she has not gotten the chin strap yet. She is doing well with her PAP device.  She tolerates the mask and feels the pressure is adequate.  Since going on PAP she feels rested in the am if she slept well the night before.  She is still falling asleep in the evenings if she does not take a nap in the afternoon.  She goes to bed at 9-9:30pm but sometimes she stays up until 2am.   She denies any significant mouth or nasal dryness or nasal congestion.  She does not think that she snores.    Past Medical History:  Diagnosis Date   Allergy 1978   Food allergies, seasonal allergies, medicine allergies   Anemia    Arthritis    Asthma    Blood  transfusion without reported diagnosis 1985   Breast cancer ([place])    Diabetes ([place])    Dyspnea    Dysrhythmia    afibb on occasion   Family history of adverse reaction to anesthesia    mother had reaction to versed...during surgery, stopped breathing   Fibromyalgia    GERD (gastroesophageal reflux disease)    Headache    History of hiatal hernia    Hyperlipidemia 1990?   Hypertension    Migraines    Osteopenia    Osteoporosis    Oxygen deficiency 2023   Psoriasis    Sleep apnea 2023   Yeast infection 10/30/2021    Past Surgical History:  Procedure Laterality Date   ABDOMINAL HYSTERECTOMY     APPENDECTOMY     ATRIAL FIBRILLATION ABLATION N/A 12/10/2021   Procedure: ATRIAL FIBRILLATION ABLATION;  Surgeon: Constance Haw, MD;  Location: [facility];  Service: Cardiovascular;  Laterality: N/A;   BREAST LUMPECTOMY WITH RADIOACTIVE SEED AND SENTINEL LYMPH NODE BIOPSY Right 02/18/2021   Procedure: RIGHT BREAST LUMPECTOMY WITH RADIOACTIVE SEED AND SENTINEL LYMPH NODE BIOPSY;  Surgeon: [name], MD;  Location: [facility];  Service: General;  Laterality: Right;   BREAST SURGERY  2022   broken wrist     CARDIOVERSION N/A 04/04/2021   Procedure: CARDIOVERSION;  Surgeon: Sueanne Margarita, MD;  Location: [facility];  Service: Cardiovascular;  Laterality: N/A;   CARDIOVERSION N/A 08/14/2021   Procedure: CARDIOVERSION;  Surgeon: Geralynn Rile, MD;  Location: [place] [facility];  Service: Cardiovascular;  Laterality: N/A;   CARDIOVERSION N/A 09/19/2021   Procedure: CARDIOVERSION;  Surgeon: Geralynn Rile, MD;  Location: [place];  Service: Cardiovascular;  Laterality: N/A;   [place]     right wrist   HYSTERECTOMY ABDOMINAL WITH SALPINGO-OOPHORECTOMY     LYSIS OF ADHESION     SHOULDER SURGERY Left    TUBAL LIGATION      Current Medications: Current Meds  Medication Sig   acetaminophen (TYLENOL)  500 MG tablet Take 500 mg by mouth every 6 (six) hours as needed for moderate pain, headache or mild pain.   apixaban (ELIQUIS) 5 MG TABS tablet Take 1 tablet (5 mg total) by mouth 2 (two) times daily. Pt needs 90 day supply.  Please keep on file till next refill.   calcipotriene (DOVONOX) 0.005 % cream Apply 1 application topically See admin instructions. Mix with fluorouracil 5% cream and apply topically twice daily for five days - as needed for precancerous on right cheek.   Calcium Carbonate (CALCIUM 600 PO) Take by mouth.   cetirizine (ZYRTEC) 10 MG tablet Take 10 mg by mouth in the morning.   cholecalciferol (VITAMIN D3) 25 MCG (1000 UNIT) tablet Take 1,000-2,000 Units by mouth See admin instructions. Take 1000 units in the morning and 2000 units at bedtime   ferrous sulfate 325 (65 FE) MG tablet Take 325 mg by mouth every morning.   fluorouracil (EFUDEX) 5 % cream Apply 1 application  topically 2 (two) times daily as needed (precancerous spots on right temple and left eyebrow). Mix with calcipotriene 0.005% cream and apply topically twice daily for five days - as needed for precancerous on right temple and left eyebrow   gabapentin (NEURONTIN) 100 MG capsule Take 200 mg by mouth at bedtime.   losartan-hydrochlorothiazide (HYZAAR) 100-25 MG tablet Take 1 tablet by mouth daily. Hold for 2 days   metFORMIN (GLUCOPHAGE-XR) 500 MG 24 hr tablet TAKE ONE TABLET BY MOUTH EVERY DAY   metoprolol succinate (TOPROL-XL) 50 MG 24 hr tablet Take 1 tablet (50 mg total) by mouth daily. Take with or immediately following a meal.   pantoprazole (PROTONIX) 40 MG tablet Take 40 mg by mouth 2 (two) times daily before a meal.   potassium chloride SA (KLOR-CON M20) 20 MEQ tablet Take 1 tablet (20 mEq total) by mouth daily.   Prenatal Vit-Fe Fumarate-FA (PRENATAL VITAMIN PLUS LOW IRON PO) Take 1 tablet by mouth in the morning.   tamoxifen (NOLVADEX) 20 MG tablet Take 1 tablet (20 mg total) by mouth every morning.    triamcinolone (KENALOG) 0.1 % Apply 1 application topically 2 (two) times daily as needed (psoriasis). Mixed with Cetaphil 1:1   UNABLE TO FIND OUTPATIENT PHYSICAL THERAPY    Diagnosis: Generalized debility due to UTI, acute gastroenteritis   venlafaxine XR (EFFEXOR-XR) 37.5 MG 24 hr capsule Take 37.5-75 mg by mouth See admin instructions. Take 1 capsule (75 mg) + 1 capsule (37.5 mg) to equal (112.5 mg) at bedtime   vitamin C (ASCORBIC ACID) 500 MG tablet Take 500 mg by mouth daily.    Allergies:   Lotensin [benazepril hcl], Morphine, Erythromycin base, Hydrocodone, Other, Banana, Chocolate, Gluten meal, Tizanidine, and Wound dressing adhesive  Social History   Socioeconomic History   Marital status: Married    Spouse name: Not on file   Number of children: 2   Years of education: Not on file   Highest education level: Not on file  Occupational History   Not on file  Tobacco Use   Smoking status: Never   Smokeless tobacco: Never  Vaping Use   Vaping Use: Never used  Substance and Sexual Activity   Alcohol use: Never   Drug use: Never   Sexual activity: Not Currently  Other Topics Concern   Not on file  Social History Narrative   Not on file   Social Determinants of Health   Financial Resource Strain: Not on file  Food Insecurity: No Food Insecurity (08/22/2022)   Hunger Vital Sign    Worried About Running Out of Food in the Last Year: Never true    Ran Out of Food in the Last Year: Never true  Transportation Needs: No Transportation Needs (08/22/2022)   PRAPARE - Hydrologist (Medical): No    Lack of Transportation (Non-Medical): No  Physical Activity: Not on file  Stress: Not on file  Social Connections: Not on file     Family History:  The patient's family history includes Arthritis in her maternal aunt and mother; Atrial fibrillation in her cousin and mother; Cancer in her father, maternal uncle, mother, paternal uncle, and paternal  uncle; Colon cancer in her cousin and paternal uncle; Colon cancer (age of onset: 55) in her father; Diabetes in her maternal aunt, maternal aunt, maternal aunt, maternal uncle, mother, paternal aunt, paternal uncle, paternal uncle, paternal uncle, paternal uncle, and sister; Early death in her father; Hearing loss in her maternal aunt, maternal uncle, paternal aunt, paternal uncle, and paternal uncle; Heart attack (age of onset: 60) in her father; Heart attack (age of onset: 86) in her paternal grandmother; Heart attack (age of onset: 74) in her mother; Heart disease in her father, maternal aunt, mother, paternal uncle, paternal uncle, and paternal uncle; Hyperlipidemia in her sister; Hypertension in her father, mother, paternal uncle, and sister; Obesity in her paternal uncle and paternal uncle; Stroke in her maternal grandmother, maternal uncle, and paternal grandfather.   ROS:   Please see the history of present illness.    ROS All other systems reviewed and are negative.      No data to display             PHYSICAL EXAM:   VS:  BP 124/72   Pulse 87   Ht 5' 1.5" (1.562 m)   Wt 203 lb 3.2 oz (92.2 kg)   SpO2 95%   BMI 37.77 kg/m    GEN: Well nourished, well developed in no acute distress HEENT: Normal NECK: No JVD; No carotid bruits LYMPHATICS: No lymphadenopathy CARDIAC:RRR, no murmurs, rubs, gallops RESPIRATORY:  Clear to auscultation without rales, wheezing or rhonchi  ABDOMEN: Soft, non-tender, non-distended MUSCULOSKELETAL:  No edema; No deformity  SKIN: Warm and dry NEUROLOGIC:  Alert and oriented x 3 PSYCHIATRIC:  Normal affect  Wt Readings from Last 3 Encounters:  10/15/22 203 lb 3.2 oz (92.2 kg)  10/08/22 201 lb (91.2 kg)  09/24/22 199 lb 8 oz (90.5 kg)      Studies/Labs Reviewed:   EKG:  EKG is not ordered today.    Recent Labs: 08/22/2022: Magnesium 1.4 10/08/2022: ALT 39; BUN 17; Creat 0.74; Hemoglobin 11.8; Platelets 182; Potassium 3.8; Sodium 140  Lipid Panel No results found for: "CHOL", "TRIG", "HDL", "CHOLHDL", "VLDL", "LDLCALC", "LDLDIRECT"   CHA2DS2-VASc Score = 4   This indicates a 4.8% annual risk of stroke. The patient's score is based upon: CHF History: 0 HTN History: 1 Diabetes History: 1 Stroke History: 0 Vascular Disease History: 0 Age Score: 1 Gender Score: 1   Additional studies/ records that were reviewed today include:   Home sleep study, CPAP/BiPAP titration and Pap compliance download   ASSESSMENT:    1. OSA (obstructive sleep apnea)   2. Primary hypertension      PLAN:  In order of problems listed above:  OSA - The patient is tolerating PAP therapy well without any problems. The PAP download performed by his DME was personally reviewed and interpreted by me today and showed an AHI of 6.5/hr on auto BiPAP with 87% compliance in using more than 4 hours nightly.  The patient has been using and benefiting from PAP use and will continue to benefit from therapy. -she still gets sleepy during the day but I do not think it is related to her OSA and I think she has an erratic sleep schedule and therefore has difficulty at time sleeping   Hypertension -BP is adequately controlled on exam today -Continue drug management losartan HCT 100-25 mg daily, Toprol-XL 50 mg daily with as needed refills  Time Spent: 25 minutes total time of encounter, including 15 minutes spent in face-to-face patient care on the date of this encounter. This time includes coordination of care and counseling regarding above mentioned problem list. Remainder of non-face-to-face time involved reviewing chart documents/testing relevant to the patient encounter and documentation in the medical record. I have independently reviewed documentation from referring provider  Medication Adjustments/Labs and Tests Ordered: Current medicines are reviewed at length with the patient today.  Concerns regarding medicines are outlined above.   Medication changes, Labs and Tests ordered today are listed in the Patient Instructions below.     Signed, Fransico Him, MD [facility], [facility] 10/15/2022 9:44 AM    [facility] [address], [place], [place]  [address] Phone: [phone]; Fax: [phone]

## 2022-10-15 NOTE — Patient Instructions (Signed)
Medication Instructions:  Your physician recommends that you continue on your current medications as directed. Please refer to the Current Medication list given to you today.  *If you need a refill on your cardiac medications before your next appointment, please call your pharmacy*   Lab Work: None. If you have labs (blood work) drawn today and your tests are completely normal, you will receive your results only by: [place] (if you have MyChart) OR A paper copy in the mail If you have any lab test that is abnormal or we need to change your treatment, we will call you to review the results.   Testing/Procedures: None.   Follow-Up: At [facility], you and your health needs are our priority.  As part of our continuing mission to provide you with exceptional heart care, we have created designated Provider Care Teams.  These Care Teams include your primary Cardiologist (physician) and Advanced Practice Providers (APPs -  Physician Assistants and Nurse Practitioners) who all work together to provide you with the care you need, when you need it.  We recommend signing up for the patient portal called "MyChart".  Sign up information is provided on this After Visit Summary.  MyChart is used to connect with patients for Virtual Visits (Telemedicine).  Patients are able to view lab/test results, encounter notes, upcoming appointments, etc.  Non-urgent messages can be sent to your provider as well.   To learn more about what you can do with MyChart, go to [email or web address].    Your next appointment:   1 year(s)  The format for your next appointment:   In Person  Provider:   Dr. Fransico Him, MD  Important Information About Sugar

## 2022-10-22 ENCOUNTER — Telehealth: Payer: Self-pay

## 2022-10-22 NOTE — Patient Outreach (Signed)
  Care Coordination   Initial Visit Note   10/22/2022 Name: Kayla Bennett MRN: [id number] DOB: 11/07/1951  Kayla Bennett is a 71 y.o. year old female who sees Luetta Nutting, DO for primary care. I spoke with  Zorita Pang by phone today.  What matters to the patients health and wellness today?  Placed call to patient to review and offer [facility] care coordination program. Patient reports that she is doing well and denies any needs today.   Provided my name and contact information if patient changes her mind.    SDOH assessments and interventions completed:  No     Care Coordination Interventions:  No, not indicated   Follow up plan: No further intervention required.   Encounter Outcome:  Pt. Refused   Tomasa Rand, RN, BSN, CEN [facility] [employer] [phone]

## 2022-10-24 ENCOUNTER — Other Ambulatory Visit: Payer: Self-pay | Admitting: Family Medicine

## 2022-10-30 ENCOUNTER — Other Ambulatory Visit: Payer: Self-pay

## 2022-10-30 MED ORDER — LOSARTAN POTASSIUM-HCTZ 100-25 MG PO TABS: 1.0000 | ORAL_TABLET | Freq: Every day | ORAL | 11 refills | Status: DC

## 2022-11-03 DIAGNOSIS — Z1231 Encounter for screening mammogram for malignant neoplasm of breast: Secondary | ICD-10-CM | POA: Diagnosis not present

## 2022-11-03 LAB — HM MAMMOGRAPHY

## 2022-11-18 ENCOUNTER — Other Ambulatory Visit: Payer: Self-pay | Admitting: Hematology

## 2022-11-18 DIAGNOSIS — Z170 Estrogen receptor positive status [ER+]: Secondary | ICD-10-CM

## 2022-11-24 ENCOUNTER — Encounter: Payer: Self-pay | Admitting: Hematology

## 2022-12-11 ENCOUNTER — Encounter: Payer: Self-pay | Admitting: Cardiology

## 2022-12-11 ENCOUNTER — Encounter: Payer: Self-pay | Admitting: Family Medicine

## 2023-01-01 ENCOUNTER — Other Ambulatory Visit: Payer: Self-pay | Admitting: Family Medicine

## 2023-01-12 ENCOUNTER — Other Ambulatory Visit: Payer: Self-pay

## 2023-01-12 MED ORDER — METOPROLOL SUCCINATE ER 50 MG PO TB24: 50.0000 mg | ORAL_TABLET | Freq: Every day | ORAL | 2 refills | Status: DC

## 2023-01-13 ENCOUNTER — Telehealth: Payer: Self-pay

## 2023-01-13 NOTE — Telephone Encounter (Signed)
Patient came into office to drop off palcard form to be signed by PCP, form placed in Dr.Matthews box,thanks.

## 2023-01-17 ENCOUNTER — Other Ambulatory Visit: Payer: Self-pay | Admitting: Family Medicine

## 2023-01-22 ENCOUNTER — Other Ambulatory Visit: Payer: Self-pay | Admitting: Family Medicine

## 2023-01-29 ENCOUNTER — Telehealth: Payer: Self-pay

## 2023-01-29 NOTE — Telephone Encounter (Signed)
Pt lvm stating COVID +. Requesting medication.   Symptoms started on 01/28/23. Tested positive today.   Please contact the patient to schedule virtual appt with next available.

## 2023-02-02 ENCOUNTER — Other Ambulatory Visit: Payer: Self-pay | Admitting: Family Medicine

## 2023-02-02 MED ORDER — VENLAFAXINE HCL ER 37.5 MG PO CP24: 37.5000 mg | ORAL_CAPSULE | Freq: Every day | ORAL | 0 refills | Status: DC

## 2023-02-06 ENCOUNTER — Other Ambulatory Visit: Payer: Self-pay | Admitting: Hematology

## 2023-02-06 DIAGNOSIS — C50411 Malignant neoplasm of upper-outer quadrant of right female breast: Secondary | ICD-10-CM

## 2023-02-25 ENCOUNTER — Telehealth: Payer: Self-pay | Admitting: Family Medicine

## 2023-02-25 NOTE — Telephone Encounter (Signed)
Called patient to schedule Medicare Annual Wellness Visit (AWV). Left message for patient to call back and schedule Medicare Annual Wellness Visit (AWV).  Last date of AWV: Never  Please schedule an appointment at any time with [facility].  If any questions, please contact me at [phone].  Thank you ,  Morgan Jessup Patient Access Advocate II Direct Dial: [phone]  

## 2023-03-27 ENCOUNTER — Other Ambulatory Visit: Payer: Self-pay

## 2023-03-27 DIAGNOSIS — C50411 Malignant neoplasm of upper-outer quadrant of right female breast: Secondary | ICD-10-CM

## 2023-03-29 ENCOUNTER — Other Ambulatory Visit: Payer: Self-pay | Admitting: Nurse Practitioner

## 2023-03-29 DIAGNOSIS — D509 Iron deficiency anemia, unspecified: Secondary | ICD-10-CM

## 2023-03-29 DIAGNOSIS — E538 Deficiency of other specified B group vitamins: Secondary | ICD-10-CM

## 2023-03-29 NOTE — Progress Notes (Unsigned)
Patient Care Team: Everrett Coombe, DO as PCP - General (Family Medicine) Meriam Sprague, MD as PCP - Cardiology (Cardiology) Regan Lemming, MD as PCP - Electrophysiology (Cardiology) Quintella Reichert, MD as PCP - Sleep Medicine (Cardiology) Almond Lint, MD as Consulting Physician (General Surgery) Malachy Mood, MD as Consulting Physician (Hematology) Lonie Peak, MD as Attending Physician (Radiation Oncology) Kennon Rounds as Physician Assistant (Cardiology) Pollyann Samples, NP as Nurse Practitioner (Nurse Practitioner)   CHIEF COMPLAINT: Follow up left breast cancer   Oncology History Overview Note  Cancer Staging Malignant neoplasm of upper-outer quadrant of right breast in female, estrogen receptor positive (HCC) Staging form: Breast, AJCC 8th Edition - Clinical stage from 11/22/2020: Stage IA (cT1b, cN0, cM0, G2, ER+, PR+, HER2-) - Signed by Malachy Mood, MD on 12/04/2020 Stage prefix: Initial diagnosis - Pathologic: Stage IA (pT1c, pN0, cM0, G2, ER+, PR+, HER2-) - Signed by Loa Socks, NP on 02/27/2021 Histologic grading system: 3 grade system    Malignant neoplasm of upper-outer quadrant of right breast in female, estrogen receptor positive (HCC)  11/05/2020 Breast US   US Breast 11/05/20  IMPRESSION No Sonographic correlate is seen for the round microlobulated 9mm mass in the upper outer right breast 10cm from the nipple, 9: 00 position.  Given this mass is new on mammography and has suspicious borders, a stereotactic guided biopsy is recommended.     11/22/2020 Cancer Staging   Staging form: Breast, AJCC 8th Edition - Clinical stage from 11/22/2020: Stage IA (cT1b, cN0, cM0, G2, ER+, PR+, HER2-) - Signed by Malachy Mood, MD on 12/04/2020   11/22/2020 Initial Biopsy    Diagnosis 11/22/20  Breast, right, needle core biopsy, 10 cmfn, upper outer quadrant, post depth - INVASIVE DUCTAL CARCINOMA. SEE NOTE Diagnosis Note Carcinoma measures 0.7 cm in  greatest linear dimension and appears grade 2. Dr. Berneice Heinrich reviewed the case and concurs with the diagnosis. A breast prognostic profile (ER, PR, Ki-67 and HER2) is pending and will be reported in an addendum. Dr. Juanetta Gosling was notified on 11/23/2020.   11/22/2020 Receptors her2    ROGNOSTIC INDICATORS Results: IMMUNOHISTOCHEMICAL AND MORPHOMETRIC ANALYSIS PERFORMED MANUALLY The tumor cells are NEGATIVE for Her2 (1+). Estrogen Receptor: 95%, POSITIVE, STRONG STAINING INTENSITY Progesterone Receptor: 95%, POSITIVE, STRONG STAINING INTENSITY Proliferation Marker Ki67: 10%   11/28/2020 Initial Diagnosis   Malignant neoplasm of upper-outer quadrant of right breast in female, estrogen receptor positive (HCC)   12/22/2020 Genetic Testing   Negative hereditary cancer genetic testing: no pathogenic variants detected in [facility] Common Hereditary Cancers Panel + Melanoma Genes.  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  The report date is December 22, 2020.    The Common Hereditary Cancers Panel+Melanoma Genes offered by [facility] includes sequencing and/or deletion duplication testing of the following 52 genes: APC, ATM, AXIN2, BAP1, BARD1, BMPR1A, BRCA1, BRCA2, BRIP1, CDH1, CDK4, CDKN2A (p14ARF), CDKN2A (p16INK4a), CHEK2, CTNNA1, DICER1, EPCAM (Deletion/duplication testing only), GREM1 (promoter region deletion/duplication testing only), HOXB13, KIT, MEN1, MITF, MLH1, MSH2, MSH3, MSH6, MUTYH, NBN, NF1, NHTL1, PALB2, PDGFRA, PMS2, POLD1, POLE, PTEN, POT1, RAD50, RAD51C, RAD51D, RB1, RNF43, SDHA, SDHB, SDHC, SDHD, SMAD4, SMARCA4, STK11, TP53, TSC1, TSC2, and VHL.  The following genes were evaluated for sequence changes only: SDHA and HOXB13 c.251G>A variant only.   02/18/2021 Surgery   FINAL MICROSCOPIC DIAGNOSIS:   A. BREAST, RIGHT, LUMPECTOMY:  - Invasive ductal carcinoma, 1.2 cm, grade 2  - Ductal carcinoma in situ, intermediate grade  -  Resection margins are negative for  carcinoma; posterior margin is less than 1 mm from carcinoma  - Biopsy site changes  - See oncology table   B. LYMPH NODE, RIGHT AXILLARY #1, SENTINEL, EXCISION:  - Lymph node, negative for carcinoma (0/1)   C. LYMPH NODE, RIGHT AXILLARY #2, SENTINEL, EXCISION:  - Lymph node, negative for carcinoma (0/1)   D. LYMPH NODE, RIGHT AXILLARY, SENTINEL, EXCISION:  - Lymph node, negative for carcinoma (0/1)   E. LYMPH NODE, RIGHT AXILLARY #3, SENTINEL, EXCISION:  - Lymph node, negative for carcinoma (0/1)   F. LYMPH NODE, RIGHT AXILLARY, SENTINEL, EXCISION:  - Lymph node, negative for carcinoma (0/1)     02/18/2021 Oncotype testing   Oncotype DX was obtained on the final surgical sample and the recurrence score of 0 predicts a risk of recurrence outside the breast over the next 9 years of 3%, if the patient's only systemic therapy is an antiestrogen for 5 years.  It also predicts no benefit from chemotherapy.   02/27/2021 Cancer Staging   Staging form: Breast, AJCC 8th Edition - Pathologic: Stage IA (pT1c, pN0, cM0, G2, ER+, PR+, HER2-) - Signed by Loa Socks, NP on 02/27/2021 Histologic grading system: 3 grade system   03/2021 - 04/2021 Radiation Therapy   Completed adjuvant Radiation by Dr. Basilio Cairo   05/2021 -  Neo-Adjuvant Anti-estrogen oral therapy   Began tamoxifen mid July 2022   07/29/2021 Survivorship   SCP delivered by Santiago Glad, NP      CURRENT THERAPY: Tamoxifen 20 mg, starting 05/2021  INTERVAL HISTORY Ms. Kayla Bennett returns for follow up as scheduled. Last seen by Dr. Mosetta Putt 09/24/22. Mammogram 11/03/22 was benign. Has a headache today and hasn't eaten anything before the visit. Doing well overall. Still having issues with her balance, no fall. Denies concerns in her breasts such as new lump/mass, nipple discharge or inversion, or skin change. Tolerating tamoxifen with stable hot flashes and joint pain. Left leg always larger than right. Sleeps with Bipap at night.  Recently when she dozes off during the day checks her O2 and in mid 80's. She also has noticed mild chest tightness and shortness of breath on exertion. Once she checked O2 after walking and it was mid 80's. Compliant with eliquis. Denies cough.   ROS  All other systems reviewed and negative   Past Medical History:  Diagnosis Date   Allergy 1978   Food allergies, seasonal allergies, medicine allergies   Anemia    Arthritis    Asthma    Blood transfusion without reported diagnosis 1985   Breast cancer (HCC)    Diabetes (HCC)    Dyspnea    Dysrhythmia    afibb on occasion   Family history of adverse reaction to anesthesia    mother had reaction to versed...during surgery, stopped breathing   Fibromyalgia    GERD (gastroesophageal reflux disease)    Headache    History of hiatal hernia    Hyperlipidemia 1990?   Hypertension    Migraines    Osteopenia    Osteoporosis    Oxygen deficiency 2023   Psoriasis    Sleep apnea 2023   Yeast infection 10/30/2021     Past Surgical History:  Procedure Laterality Date   ABDOMINAL HYSTERECTOMY     APPENDECTOMY     ATRIAL FIBRILLATION ABLATION N/A 12/10/2021   Procedure: ATRIAL FIBRILLATION ABLATION;  Surgeon: Regan Lemming, MD;  Location: [facility] INVASIVE CV LAB;  Service: Cardiovascular;  Laterality: N/A;  BREAST LUMPECTOMY WITH RADIOACTIVE SEED AND SENTINEL LYMPH NODE BIOPSY Right 02/18/2021   Procedure: RIGHT BREAST LUMPECTOMY WITH RADIOACTIVE SEED AND SENTINEL LYMPH NODE BIOPSY;  Surgeon: Almond Lint, MD;  Location: [facility] OR;  Service: General;  Laterality: Right;   BREAST SURGERY  2022   broken wrist     CARDIOVERSION N/A 04/04/2021   Procedure: CARDIOVERSION;  Surgeon: Quintella Reichert, MD;  Location: [facility] ENDOSCOPY;  Service: Cardiovascular;  Laterality: N/A;   CARDIOVERSION N/A 08/14/2021   Procedure: CARDIOVERSION;  Surgeon: Sande Rives, MD;  Location: [facility] ENDOSCOPY;  Service: Cardiovascular;  Laterality: N/A;    CARDIOVERSION N/A 09/19/2021   Procedure: CARDIOVERSION;  Surgeon: Sande Rives, MD;  Location: [facility] ENDOSCOPY;  Service: Cardiovascular;  Laterality: N/A;   CESAREAN SECTION  1976 and 1977   CHOLECYSTECTOMY     FRACTURE SURGERY     right wrist   HYSTERECTOMY ABDOMINAL WITH SALPINGO-OOPHORECTOMY     LYSIS OF ADHESION     SHOULDER SURGERY Left    TUBAL LIGATION       Outpatient Encounter Medications as of 03/30/2023  Medication Sig   acetaminophen (TYLENOL) 500 MG tablet Take 500 mg by mouth every 6 (six) hours as needed for moderate pain, headache or mild pain.   apixaban (ELIQUIS) 5 MG TABS tablet Take 1 tablet (5 mg total) by mouth 2 (two) times daily. Pt needs 90 day supply.  Please keep on file till next refill.   calcipotriene (DOVONOX) 0.005 % cream Apply 1 application topically See admin instructions. Mix with fluorouracil 5% cream and apply topically twice daily for five days - as needed for precancerous on right cheek.   Calcium Carbonate (CALCIUM 600 PO) Take by mouth.   cetirizine (ZYRTEC) 10 MG tablet Take 10 mg by mouth in the morning.   cholecalciferol (VITAMIN D3) 25 MCG (1000 UNIT) tablet Take 1,000-2,000 Units by mouth See admin instructions. Take 1000 units in the morning and 2000 units at bedtime   ferrous sulfate 325 (65 FE) MG tablet Take 325 mg by mouth every morning.   fluorouracil (EFUDEX) 5 % cream Apply 1 application  topically 2 (two) times daily as needed (precancerous spots on right temple and left eyebrow). Mix with calcipotriene 0.005% cream and apply topically twice daily for five days - as needed for precancerous on right temple and left eyebrow   gabapentin (NEURONTIN) 100 MG capsule TAKE THREE CAPSULES BY MOUTH AT BEDTIME   losartan-hydrochlorothiazide (HYZAAR) 100-25 MG tablet Take 1 tablet by mouth daily.   metFORMIN (GLUCOPHAGE-XR) 500 MG 24 hr tablet TAKE ONE TABLET BY MOUTH EVERY DAY   metoprolol succinate (TOPROL-XL) 50 MG 24 hr tablet Take 1  tablet (50 mg total) by mouth daily. Take with or immediately following a meal.   pantoprazole (PROTONIX) 40 MG tablet TAKE ONE TABLET BY MOUTH TWICE DAILY   potassium chloride SA (KLOR-CON M20) 20 MEQ tablet Take 1 tablet (20 mEq total) by mouth daily.   Prenatal Vit-Fe Fumarate-FA (PRENATAL VITAMIN PLUS LOW IRON PO) Take 1 tablet by mouth in the morning.   tamoxifen (NOLVADEX) 20 MG tablet Take 1 tablet (20 mg total) by mouth every morning.   triamcinolone (KENALOG) 0.1 % Apply 1 application topically 2 (two) times daily as needed (psoriasis). Mixed with Cetaphil 1:1   UNABLE TO FIND OUTPATIENT PHYSICAL THERAPY    Diagnosis: Generalized debility due to UTI, acute gastroenteritis   venlafaxine XR (EFFEXOR-XR) 37.5 MG 24 hr capsule Take 1 capsule (37.5 mg total)  by mouth at bedtime.   venlafaxine XR (EFFEXOR-XR) 75 MG 24 hr capsule TAKE ONE CAPSULE BY MOUTH EVERY DAY   vitamin C (ASCORBIC ACID) 500 MG tablet Take 500 mg by mouth daily.   No facility-administered encounter medications on file as of 03/30/2023.     Today's Vitals   03/30/23 1043  BP: 134/66  Pulse: 71  Resp: 17  Temp: 98.4 F (36.9 C)  TempSrc: Oral  SpO2: 99%  Weight: 205 lb 14.4 oz (93.4 kg)   Body mass index is 38.27 kg/m.    On ambulation HR max 107, O2 >93%   PHYSICAL EXAM GENERAL:alert, no distress and comfortable SKIN: no rash  EYES: sclera clear NECK: without mass LYMPH:  no palpable cervical or supraclavicular lymphadenopathy  LUNGS: clear with normal breathing effort HEART: regular rate & rhythm, no lower extremity edema. Left leg larger than right ABDOMEN: abdomen soft, non-tender and normal bowel sounds NEURO: alert & oriented x 3 with fluent speech, altered gait BREAST exam: nipple inversion without discharge. S/p left lumpectomy, incisions completely healed. No palpable mass or nodularity in either breast or axilla that I could appreciate    CBC    Component Value Date/Time   WBC 4.5  03/30/2023 1001   WBC 4.8 10/08/2022 1042   RBC 3.47 (L) 03/30/2023 1001   HGB 11.4 (L) 03/30/2023 1001   HGB 12.1 11/25/2021 1459   HCT 34.4 (L) 03/30/2023 1001   HCT 36.1 11/25/2021 1459   PLT 143 (L) 03/30/2023 1001   PLT 250 11/25/2021 1459   MCV 99.1 03/30/2023 1001   MCV 91 11/25/2021 1459   MCH 32.9 03/30/2023 1001   MCHC 33.1 03/30/2023 1001   RDW 13.1 03/30/2023 1001   RDW 16.0 (H) 11/25/2021 1459   LYMPHSABS 1.3 03/30/2023 1001   MONOABS 0.4 03/30/2023 1001   EOSABS 0.1 03/30/2023 1001   BASOSABS 0.0 03/30/2023 1001     CMP     Component Value Date/Time   NA 141 03/30/2023 1001   NA 139 11/25/2021 1459   K 4.0 03/30/2023 1001   CL 102 03/30/2023 1001   CO2 31 03/30/2023 1001   GLUCOSE 137 (H) 03/30/2023 1001   BUN 19 03/30/2023 1001   BUN 23 11/25/2021 1459   CREATININE 0.77 03/30/2023 1001   CREATININE 0.74 10/08/2022 1042   CALCIUM 8.9 03/30/2023 1001   PROT 6.9 03/30/2023 1001   ALBUMIN 4.0 03/30/2023 1001   AST 54 (H) 03/30/2023 1001   ALT 30 03/30/2023 1001   ALKPHOS 52 03/30/2023 1001   BILITOT 0.4 03/30/2023 1001   GFRNONAA >60 03/30/2023 1001     ASSESSMENT & PLAN:Brityn P Villamor is a 72 y.o. post-hysterectomy female with    1. Malignant neoplasm of upper-outer quadrant of right breast, Stage IA, (pT1c, pN0), ER+/PR+/HER2-, Grade II  -found on screening mammogram. S/p right lumpectomy on 02/18/21 under Dr. Donell Beers, path showed 1.2 cm IDC with DCIS. Margins and lymph nodes negative -Oncotype DX score of 0. -s/p radiation under Dr. Basilio Cairo 04/09/21 - 05/07/21. -she started tamoxifen in 05/2021, goal 5 years. She is tolerating well except some vaginal dryness/discharge.  -last [facility] in 11/03/2022 was benign -Ms. Dike is clinically doing well from breast cancer standpoint. Tolerating Tamoxifen with stable joint aches and hot flashes. Breast exam is benign. Labs are stable. Overall no clinical concern for recurrence.  -Continue breast cancer  surveillance and Tamoxifen -Mammo due 10/2023 -F/up in 6 months or sooner if needed  2.  Chest tightness, exertional dyspnea -She recently developed mild tightness and shortness of breath on exertion. She checked O2 at home and noted mid 80's during nap and once on ambulation -She does have OSA on Bipap, with documented O2 sat in 80's previously  -Today she denies chest pain or dyspnea while ambulating, no hypoxia, HR max 107 which she states is her baseline. She is compliant with eliquis -While tamoxifen increases risk of thrombosis my clinical suspicion is not very high.  -May be related to activity intolerance due to balance issue and knee pain -I recommend to f/up with PCP and notify with worsening dyspnea or low O2 at home   3. Anemia, B12 and iron deficiencies -pt is s/p hysterectomy, last colonoscopy benign per pt (recall due 2025) -Received IV Feraheme during hospitalization on 10/14/21 -last iron panel, ferritin, and B12 on 02/21/22 were WNL. -hgb fluctuates. She endorses taking B12, oral iron, and prenatal vitamin. -Hgb today 11.4, B12 432, TIBC 518; ferritin is pending. I recommend to take vit C and oral iron at lunch time, separate from PPI. She agrees  -continue monitoring    4. Comorbidities: Chronic joint pain, DM, HTN, fibromyalgia, GERD, HLD, osteopenia, Neuropathy -her joint pain is in b/l shoulders, left knee and hip with very limited ROM of left shoulder. -Most recent DEXA scan 08/21/21 showed T-score -1.3. -She is taking a vitamin D supplement but not calcium; encouraged to start calcium on last visit  -F/up PCP     PLAN: -December mammogram and today's labs reviewed; f/up pending ferritin -Continue breast cancer surveillance -Continue oral B12 and oral iron with vit C, separate from PPI  -Mammo due 10/2023 -Monitor respiratory sx -F/up with PCP -Return to [place] in 6 months, or sooner if needed   Orders Placed This Encounter  Procedures   MM DIAG BREAST TOMO  BILATERAL    Standing Status:   Future    Standing Expiration Date:   03/29/2024    Scheduling Instructions:     [facility]    Order Specific Question:   Reason for Exam (SYMPTOM  OR DIAGNOSIS REQUIRED)    Answer:   Right breast cancer 02/2021    Order Specific Question:   Preferred imaging location?    Answer:   External      All questions were answered. The patient knows to call the clinic with any problems, questions or concerns. No barriers to learning were detected. I spent 20 minutes counseling the patient face to face. The total time spent in the appointment was 30 minutes and more than 50% was on counseling, review of test results, and coordination of care.   Santiago Glad, NP-C 03/30/2023

## 2023-03-30 ENCOUNTER — Inpatient Hospital Stay: Payer: Medicare Other | Attending: Nurse Practitioner

## 2023-03-30 ENCOUNTER — Encounter: Payer: Self-pay | Admitting: Nurse Practitioner

## 2023-03-30 ENCOUNTER — Inpatient Hospital Stay (HOSPITAL_BASED_OUTPATIENT_CLINIC_OR_DEPARTMENT_OTHER): Payer: Medicare Other | Admitting: Nurse Practitioner

## 2023-03-30 ENCOUNTER — Other Ambulatory Visit: Payer: Self-pay

## 2023-03-30 VITALS — BP 134/66 | HR 71 | Temp 98.4°F | Resp 17 | Wt 205.9 lb

## 2023-03-30 DIAGNOSIS — I10 Essential (primary) hypertension: Secondary | ICD-10-CM | POA: Insufficient documentation

## 2023-03-30 DIAGNOSIS — E538 Deficiency of other specified B group vitamins: Secondary | ICD-10-CM | POA: Diagnosis not present

## 2023-03-30 DIAGNOSIS — E1140 Type 2 diabetes mellitus with diabetic neuropathy, unspecified: Secondary | ICD-10-CM | POA: Diagnosis not present

## 2023-03-30 DIAGNOSIS — D649 Anemia, unspecified: Secondary | ICD-10-CM | POA: Insufficient documentation

## 2023-03-30 DIAGNOSIS — Z7901 Long term (current) use of anticoagulants: Secondary | ICD-10-CM | POA: Diagnosis not present

## 2023-03-30 DIAGNOSIS — Z170 Estrogen receptor positive status [ER+]: Secondary | ICD-10-CM

## 2023-03-30 DIAGNOSIS — R0789 Other chest pain: Secondary | ICD-10-CM | POA: Diagnosis not present

## 2023-03-30 DIAGNOSIS — D509 Iron deficiency anemia, unspecified: Secondary | ICD-10-CM | POA: Insufficient documentation

## 2023-03-30 DIAGNOSIS — C50411 Malignant neoplasm of upper-outer quadrant of right female breast: Secondary | ICD-10-CM | POA: Diagnosis not present

## 2023-03-30 DIAGNOSIS — R0600 Dyspnea, unspecified: Secondary | ICD-10-CM | POA: Diagnosis not present

## 2023-03-30 DIAGNOSIS — Z90710 Acquired absence of both cervix and uterus: Secondary | ICD-10-CM | POA: Insufficient documentation

## 2023-03-30 DIAGNOSIS — Z79810 Long term (current) use of selective estrogen receptor modulators (SERMs): Secondary | ICD-10-CM | POA: Insufficient documentation

## 2023-03-30 LAB — CMP (CANCER CENTER ONLY)
ALT: 30 U/L (ref 0–44)
AST: 54 U/L — ABNORMAL HIGH (ref 15–41)
Albumin: 4 g/dL (ref 3.5–5.0)
Alkaline Phosphatase: 52 U/L (ref 38–126)
Anion gap: 8 (ref 5–15)
BUN: 19 mg/dL (ref 8–23)
CO2: 31 mmol/L (ref 22–32)
Calcium: 8.9 mg/dL (ref 8.9–10.3)
Chloride: 102 mmol/L (ref 98–111)
Creatinine: 0.77 mg/dL (ref 0.44–1.00)
GFR, Estimated: >60 mL/min
Glucose, Bld: 137 mg/dL — ABNORMAL HIGH (ref 70–99)
Potassium: 4 mmol/L (ref 3.5–5.1)
Sodium: 141 mmol/L (ref 135–145)
Total Bilirubin: 0.4 mg/dL (ref 0.3–1.2)
Total Protein: 6.9 g/dL (ref 6.5–8.1)

## 2023-03-30 LAB — CBC WITH DIFFERENTIAL (CANCER CENTER ONLY)
Abs Immature Granulocytes: 0.01 10*3/uL (ref 0.00–0.07)
Basophils Absolute: 0 10*3/uL (ref 0.0–0.1)
Basophils Relative: 1 %
Eosinophils Absolute: 0.1 10*3/uL (ref 0.0–0.5)
Eosinophils Relative: 3 %
HCT: 34.4 % — ABNORMAL LOW (ref 36.0–46.0)
Hemoglobin: 11.4 g/dL — ABNORMAL LOW (ref 12.0–15.0)
Immature Granulocytes: 0 %
Lymphocytes Relative: 30 %
Lymphs Abs: 1.3 10*3/uL (ref 0.7–4.0)
MCH: 32.9 pg (ref 26.0–34.0)
MCHC: 33.1 g/dL (ref 30.0–36.0)
MCV: 99.1 fL (ref 80.0–100.0)
Monocytes Absolute: 0.4 10*3/uL (ref 0.1–1.0)
Monocytes Relative: 9 %
Neutro Abs: 2.6 10*3/uL (ref 1.7–7.7)
Neutrophils Relative %: 57 %
Platelet Count: 143 10*3/uL — ABNORMAL LOW (ref 150–400)
RBC: 3.47 MIL/uL — ABNORMAL LOW (ref 3.87–5.11)
RDW: 13.1 % (ref 11.5–15.5)
WBC Count: 4.5 10*3/uL (ref 4.0–10.5)
nRBC: 0 % (ref 0.0–0.2)

## 2023-03-30 LAB — IRON AND IRON BINDING CAPACITY (CC-WL,HP ONLY)
Iron: 85 ug/dL (ref 28–170)
Saturation Ratios: 16 % (ref 10.4–31.8)
TIBC: 518 ug/dL — ABNORMAL HIGH (ref 250–450)
UIBC: 433 ug/dL (ref 148–442)

## 2023-03-30 LAB — VITAMIN B12: Vitamin B-12: 432 pg/mL (ref 180–914)

## 2023-03-30 LAB — FERRITIN: Ferritin: 108 ng/mL (ref 11–307)

## 2023-04-08 ENCOUNTER — Ambulatory Visit (INDEPENDENT_AMBULATORY_CARE_PROVIDER_SITE_OTHER): Payer: Medicare Other | Admitting: Family Medicine

## 2023-04-08 ENCOUNTER — Encounter: Payer: Self-pay | Admitting: Family Medicine

## 2023-04-08 ENCOUNTER — Ambulatory Visit (INDEPENDENT_AMBULATORY_CARE_PROVIDER_SITE_OTHER): Payer: Medicare Other

## 2023-04-08 VITALS — BP 126/83 | HR 69 | Ht 61.5 in | Wt 204.0 lb

## 2023-04-08 DIAGNOSIS — R0781 Pleurodynia: Secondary | ICD-10-CM | POA: Diagnosis not present

## 2023-04-08 DIAGNOSIS — E1169 Type 2 diabetes mellitus with other specified complication: Secondary | ICD-10-CM

## 2023-04-08 DIAGNOSIS — I4819 Other persistent atrial fibrillation: Secondary | ICD-10-CM

## 2023-04-08 DIAGNOSIS — D6869 Other thrombophilia: Secondary | ICD-10-CM

## 2023-04-08 DIAGNOSIS — E669 Obesity, unspecified: Secondary | ICD-10-CM

## 2023-04-08 DIAGNOSIS — I10 Essential (primary) hypertension: Secondary | ICD-10-CM | POA: Diagnosis not present

## 2023-04-08 DIAGNOSIS — R2689 Other abnormalities of gait and mobility: Secondary | ICD-10-CM | POA: Diagnosis not present

## 2023-04-08 DIAGNOSIS — E119 Type 2 diabetes mellitus without complications: Secondary | ICD-10-CM | POA: Diagnosis not present

## 2023-04-08 LAB — POCT UA - MICROALBUMIN
Albumin/Creatinine Ratio, Urine, POC: <30
Creatinine, POC: 100 mg/dL
Microalbumin Ur, POC: 30 mg/L

## 2023-04-08 LAB — POCT GLYCOSYLATED HEMOGLOBIN (HGB A1C): HbA1c, POC (controlled diabetic range): 6.3 % (ref 0.0–7.0)

## 2023-04-08 MED ORDER — METOPROLOL SUCCINATE ER 100 MG PO TB24: 100.0000 mg | ORAL_TABLET | Freq: Every day | ORAL | 2 refills | Status: DC

## 2023-04-08 NOTE — Progress Notes (Signed)
Kayla Bennett - 72 y.o. female MRN [id number]  Date of birth: 04/08/1951  Subjective Chief Complaint  Patient presents with   Chest Pain   Shortness of Breath    HPI Kayla Bennett is a 72 y.o. female here today for follow up visit.   She reports that  she is having some L sided rib pain for about 2 weeks.  Started after picking up her grandchild.  Felt a pop in her ribs.  Has associated chest tightness as well.  She has had episodes where O2 sats have dropped to 89% with ambulation.  She did had a walking test with cancer and did not desaturate.   Having some balance issues, feels like she tilts to the left when walking.  No recent falls.  She has had this for quite some time.  Her PT recommended that she try a cane but she doesn't feel that she needs this.   Continues on metformin for management of diabetes.  A1c at 6.3%.  History of A. Fib, followed by cardiology. Remains on metoprolol, however she has had several episodes of elevated heart rate.  She is anticoagulated with Eliquis.  No side effects at this time.  She has had iron deficiency anemia and is taking iron supplement.   He oncologist is following her for this.   Also on losartan/hctz for HTN.  BP is well controlled.    History of breast cancer and remains on tamoxifen.   ROS:  A comprehensive ROS was completed and negative except as noted per HPI  Allergies  Allergen Reactions   Lotensin [Benazepril Hcl] Swelling   Morphine Itching and Rash   Erythromycin Base Other (See Comments)    Unknown reaction   Hydrocodone Other (See Comments)    Unknown reaction     Other Swelling    Mustard - vaginal swelling/pain    Banana Swelling    vaginal swelling/pain   Chocolate Swelling    vaginal swelling/pain   Gluten Meal Diarrhea and Other (See Comments)    Upset stomach    Tizanidine Other (See Comments)    Lowered blood pressure    Wound Dressing Adhesive Other (See Comments)    Skin burns, blisters     Past  Medical History:  Diagnosis Date   Allergy 1978   Food allergies, seasonal allergies, medicine allergies   Anemia    Arthritis    Asthma    Blood transfusion without reported diagnosis 1985   Breast cancer (HCC)    Diabetes (HCC)    Dyspnea    Dysrhythmia    afibb on occasion   Family history of adverse reaction to anesthesia    mother had reaction to versed...during surgery, stopped breathing   Fibromyalgia    GERD (gastroesophageal reflux disease)    Headache    History of hiatal hernia    Hyperlipidemia 1990?   Hypertension    Migraines    Osteopenia    Osteoporosis    Oxygen deficiency 2023   Psoriasis    Sleep apnea 2023   Yeast infection 10/30/2021    Past Surgical History:  Procedure Laterality Date   ABDOMINAL HYSTERECTOMY     APPENDECTOMY     ATRIAL FIBRILLATION ABLATION N/A 12/10/2021   Procedure: ATRIAL FIBRILLATION ABLATION;  Surgeon: Regan Lemming, MD;  Location: [facility];  Service: Cardiovascular;  Laterality: N/A;   BREAST LUMPECTOMY WITH RADIOACTIVE SEED AND SENTINEL LYMPH NODE BIOPSY Right 02/18/2021   Procedure: RIGHT BREAST  LUMPECTOMY WITH RADIOACTIVE SEED AND SENTINEL LYMPH NODE BIOPSY;  Surgeon: Almond Lint, MD;  Location: [facility] OR;  Service: General;  Laterality: Right;   BREAST SURGERY  2022   broken wrist     CARDIOVERSION N/A 04/04/2021   Procedure: CARDIOVERSION;  Surgeon: Quintella Reichert, MD;  Location: [facility] ENDOSCOPY;  Service: Cardiovascular;  Laterality: N/A;   CARDIOVERSION N/A 08/14/2021   Procedure: CARDIOVERSION;  Surgeon: Sande Rives, MD;  Location: [facility] ENDOSCOPY;  Service: Cardiovascular;  Laterality: N/A;   CARDIOVERSION N/A 09/19/2021   Procedure: CARDIOVERSION;  Surgeon: Sande Rives, MD;  Location: [facility] ENDOSCOPY;  Service: Cardiovascular;  Laterality: N/A;   CESAREAN SECTION  1976 and 1977   CHOLECYSTECTOMY     FRACTURE SURGERY     right wrist   HYSTERECTOMY ABDOMINAL WITH SALPINGO-OOPHORECTOMY      LYSIS OF ADHESION     SHOULDER SURGERY Left    TUBAL LIGATION      Social History   Socioeconomic History   Marital status: Married    Spouse name: Not on file   Number of children: 2   Years of education: Not on file   Highest education level: Not on file  Occupational History   Not on file  Tobacco Use   Smoking status: Never   Smokeless tobacco: Never  Vaping Use   Vaping Use: Never used  Substance and Sexual Activity   Alcohol use: Never   Drug use: Never   Sexual activity: Not Currently  Other Topics Concern   Not on file  Social History Narrative   Not on file   Social Determinants of Health   Financial Resource Strain: Not on file  Food Insecurity: No Food Insecurity (08/22/2022)   Hunger Vital Sign    Worried About Running Out of Food in the Last Year: Never true    Ran Out of Food in the Last Year: Never true  Transportation Needs: No Transportation Needs (08/22/2022)   PRAPARE - Administrator, Civil Service (Medical): No    Lack of Transportation (Non-Medical): No  Physical Activity: Not on file  Stress: Not on file  Social Connections: Not on file    Family History  Problem Relation Age of Onset   Atrial fibrillation Mother    Heart attack Mother 33   Arthritis Mother    Cancer Mother    Diabetes Mother    Heart disease Mother    Hypertension Mother    Colon cancer Father 42   Heart attack Father 25   Cancer Father    Early death Father    Heart disease Father    Hypertension Father    Heart attack Paternal Grandmother 16   Cancer Maternal Uncle        melanoma    Colon cancer Paternal Uncle    Cancer Paternal Uncle    Diabetes Paternal Uncle    Heart disease Paternal Uncle    Obesity Paternal Uncle    Colon cancer Cousin    Atrial fibrillation Cousin    Stroke Maternal Grandmother    Stroke Paternal Grandfather    Arthritis Maternal Aunt    Diabetes Maternal Aunt    Hearing loss Maternal Aunt    Cancer Paternal Uncle     Heart disease Paternal Uncle    Obesity Paternal Uncle    Diabetes Maternal Aunt    Diabetes Maternal Aunt    Heart disease Maternal Aunt    Diabetes Maternal Uncle  Hearing loss Maternal Uncle    Diabetes Paternal Aunt    Hearing loss Paternal Aunt    Diabetes Sister    Hyperlipidemia Sister    Hypertension Sister    Diabetes Paternal Uncle    Hearing loss Paternal Uncle    Diabetes Paternal Uncle    Hearing loss Paternal Uncle    Heart disease Paternal Uncle    Hypertension Paternal Uncle    Diabetes Paternal Uncle    Stroke Maternal Uncle     Health Maintenance  Topic Date Due   COVID-19 Vaccine (3 - 2023-24 season) 06/18/2023 (Originally 07/18/2022)   Pneumonia Vaccine 34+ Years old (1 of 1 - PCV) 06/26/2023 (Originally 02/18/2016)   COLONOSCOPY (Pts 45-49yrs Insurance coverage will need to be confirmed)  06/26/2023 (Originally 02/18/1996)   Hepatitis C Screening  06/26/2023 (Originally 02/17/1969)   Medicare Annual Wellness (AWV)  07/09/2023 (Originally 08/14/2021)   Zoster Vaccines- Shingrix (1 of 2) 07/09/2023 (Originally 02/17/1970)   OPHTHALMOLOGY EXAM  06/06/2023   INFLUENZA VACCINE  06/18/2023   FOOT EXAM  06/26/2023   HEMOGLOBIN A1C  10/09/2023   MAMMOGRAM  11/04/2023   Diabetic kidney evaluation - eGFR measurement  03/29/2024   Diabetic kidney evaluation - Urine ACR  04/07/2024   DTaP/Tdap/Td (2 - Td or Tdap) 05/31/2024   DEXA SCAN  Completed   HPV VACCINES  Aged Out     ----------------------------------------------------------------------------------------------------------------------------------------------------------------------------------------------------------------- Physical Exam BP 126/83 (BP Location: Left Arm, Patient Position: Sitting, Cuff Size: Large)   Pulse 69   Ht 5' 1.5" (1.562 m)   Wt 204 lb (92.5 kg)   SpO2 97%   BMI 37.92 kg/m   Physical Exam Constitutional:      Appearance: Normal appearance.  HENT:     Head: Normocephalic and  atraumatic.  Eyes:     General: No scleral icterus. Cardiovascular:     Rate and Rhythm: Normal rate and regular rhythm.  Pulmonary:     Effort: Pulmonary effort is normal.     Breath sounds: Normal breath sounds.  Chest:     Chest wall: Tenderness (L lateral rib) present.  Musculoskeletal:     Cervical back: Neck supple.  Neurological:     Mental Status: She is alert.  Psychiatric:        Mood and Affect: Mood normal.        Behavior: Behavior normal.     ------------------------------------------------------------------------------------------------------------------------------------------------------------------------------------------------------------------- Assessment and Plan  Hypertension BP is well controlled.  Continue losartan/hctz at current strength.   Persistent atrial fibrillation (HCC)  Heart rate has been elevated.  Often times in 110s to 120.  Increasing Toprol to 100 mg daily.  Recommend she keep an eye on blood pressure.  She will return if she is having significant side effects with increase.  Encouraged follow-up with cardiology.  Type 2 diabetes mellitus with obesity (HCC) Diabetes remains very well-controlled.  She will continue on metformin.  Dietary changes encouraged.  Balance problem Continues to have some issues with balance.  She does not want to use any assistive devices at this time.  Denies any falls.  Secondary hypercoagulable state (HCC) She remains on eliquis and is tolerating well  Rib pain on left side Chest x-ray with rib detail ordered.   Meds ordered this encounter  Medications   metoprolol succinate (TOPROL-XL) 100 MG 24 hr tablet    Sig: Take 1 tablet (100 mg total) by mouth daily. Take with or immediately following a meal.    Dispense:  90 tablet  Refill:  2    Return in about 6 months (around 10/09/2023) for T2DM.    This visit occurred during the SARS-CoV-2 public health emergency.  Safety protocols were in place,  including screening questions prior to the visit, additional usage of staff PPE, and extensive cleaning of exam room while observing appropriate contact time as indicated for disinfecting solutions.

## 2023-04-08 NOTE — Patient Instructions (Signed)
Increase Toprol to 100mg  daily.  We'll be in touch with xray results.  See me again in 6 months.

## 2023-04-08 NOTE — Assessment & Plan Note (Signed)
Diabetes remains very well-controlled.  She will continue on metformin.  Dietary changes encouraged.

## 2023-04-08 NOTE — Assessment & Plan Note (Signed)
She remains on eliquis and is tolerating well

## 2023-04-08 NOTE — Assessment & Plan Note (Signed)
Chest x-ray with rib detail ordered.

## 2023-04-08 NOTE — Assessment & Plan Note (Signed)
BP is well controlled.  Continue losartan/hctz at current strength.

## 2023-04-08 NOTE — Assessment & Plan Note (Signed)
  Heart rate has been elevated.  Often times in 110s to 120.  Increasing Toprol to 100 mg daily.  Recommend she keep an eye on blood pressure.  She will return if she is having significant side effects with increase.  Encouraged follow-up with cardiology.

## 2023-04-08 NOTE — Assessment & Plan Note (Signed)
Continues to have some issues with balance.  She does not want to use any assistive devices at this time.  Denies any falls.

## 2023-04-24 ENCOUNTER — Other Ambulatory Visit: Payer: Self-pay | Admitting: Family Medicine

## 2023-05-09 ENCOUNTER — Other Ambulatory Visit: Payer: Self-pay | Admitting: Family Medicine

## 2023-05-14 ENCOUNTER — Other Ambulatory Visit: Payer: Self-pay

## 2023-05-14 MED ORDER — POTASSIUM CHLORIDE CRYS ER 20 MEQ PO TBCR: 20.0000 meq | EXTENDED_RELEASE_TABLET | Freq: Every day | ORAL | 0 refills | Status: DC

## 2023-06-08 ENCOUNTER — Other Ambulatory Visit: Payer: Self-pay | Admitting: Hematology

## 2023-06-08 DIAGNOSIS — C50411 Malignant neoplasm of upper-outer quadrant of right female breast: Secondary | ICD-10-CM

## 2023-07-02 DIAGNOSIS — D2371 Other benign neoplasm of skin of right lower limb, including hip: Secondary | ICD-10-CM | POA: Diagnosis not present

## 2023-07-02 DIAGNOSIS — D2271 Melanocytic nevi of right lower limb, including hip: Secondary | ICD-10-CM | POA: Diagnosis not present

## 2023-07-02 DIAGNOSIS — L309 Dermatitis, unspecified: Secondary | ICD-10-CM | POA: Diagnosis not present

## 2023-07-02 DIAGNOSIS — L218 Other seborrheic dermatitis: Secondary | ICD-10-CM | POA: Diagnosis not present

## 2023-07-02 DIAGNOSIS — L570 Actinic keratosis: Secondary | ICD-10-CM | POA: Diagnosis not present

## 2023-07-02 DIAGNOSIS — L821 Other seborrheic keratosis: Secondary | ICD-10-CM | POA: Diagnosis not present

## 2023-07-07 ENCOUNTER — Other Ambulatory Visit: Payer: Self-pay

## 2023-07-07 ENCOUNTER — Other Ambulatory Visit: Payer: Self-pay | Admitting: Family Medicine

## 2023-07-07 MED ORDER — POTASSIUM CHLORIDE CRYS ER 20 MEQ PO TBCR: 20.0000 meq | EXTENDED_RELEASE_TABLET | Freq: Every day | ORAL | 0 refills | Status: DC

## 2023-07-08 ENCOUNTER — Other Ambulatory Visit: Payer: Self-pay

## 2023-07-08 MED ORDER — POTASSIUM CHLORIDE CRYS ER 20 MEQ PO TBCR: 20.0000 meq | EXTENDED_RELEASE_TABLET | Freq: Every day | ORAL | 0 refills | Status: DC

## 2023-07-08 NOTE — Telephone Encounter (Signed)
Requesting Venlafaxine ER  Last written 06/24 2024 Last OV 05/22/202 upcoming appt 10/09/23

## 2023-07-13 ENCOUNTER — Other Ambulatory Visit: Payer: Self-pay | Admitting: Cardiology

## 2023-07-13 NOTE — Telephone Encounter (Signed)
Prescription refill request for Eliquis received. Indication: AF Last office visit: 03/20/22  Carleene Mains MD Scr: 0.77 on 03/30/23 Age: 72 Weight: 92.5kg  Based on above findings Eliquis 5mg  twice daily is the appropriate dose.  Pt is past due for appt with MD.  Message sent to schedulers.

## 2023-08-01 ENCOUNTER — Other Ambulatory Visit: Payer: Self-pay | Admitting: Family Medicine

## 2023-08-03 ENCOUNTER — Other Ambulatory Visit: Payer: Self-pay | Admitting: Family Medicine

## 2023-08-11 ENCOUNTER — Other Ambulatory Visit: Payer: Self-pay | Admitting: Family Medicine

## 2023-08-13 IMAGING — MR MR HEAD WO/W CM
10 of 13 series · 35 of 48 positions shown · IV contrast (gadavist)
Comparison: None.

CLINICAL DATA: Headache, new or worsening

EXAM:
MRI HEAD WITHOUT AND WITH CONTRAST
TECHNIQUE: Multiplanar, multiecho pulse sequences of the brain and surrounding
structures were obtained without and with intravenous contrast.
CONTRAST:  9mL GADAVIST GADOBUTROL 1 MMOL/ML IV SOLN

[Series 3: DWI · axial · 3.0mm · 1.09mm/px · z∈[-81,+70]mm · 9 of 104 slices shown (1 of 4)]
[im 1/104]
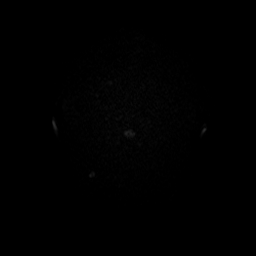
[im 13/104]
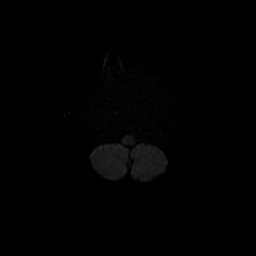
[im 26/104]
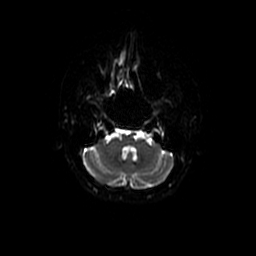
[im 39/104]
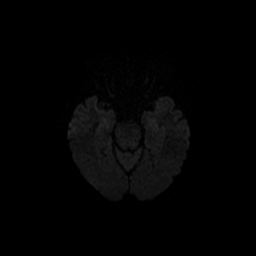
[im 52/104]
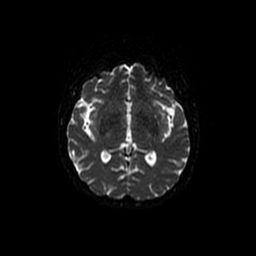
[im 65/104]
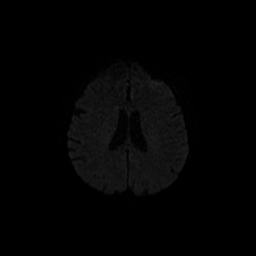
[im 78/104]
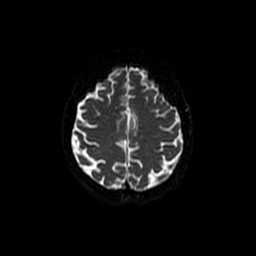
[im 91/104]
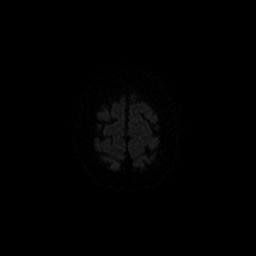
[im 104/104]
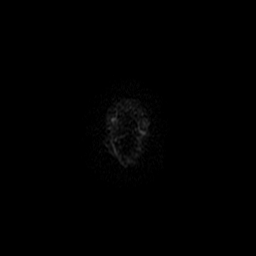

[Series 4: DWI · coronal · 5.0mm · 1.09mm/px · 6 of 76 slices shown (2 of 4)]
[im 1/76]
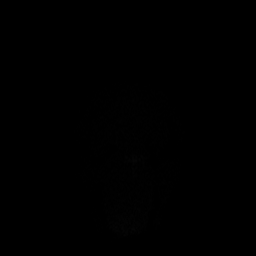
[im 16/76]
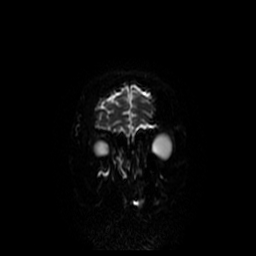
[im 31/76]
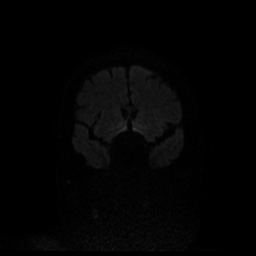
[im 46/76]
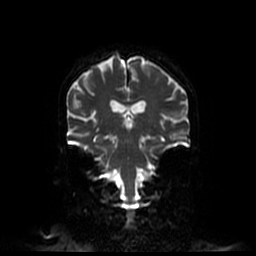
[im 61/76]
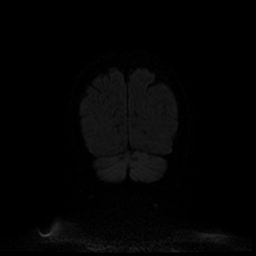
[im 76/76]
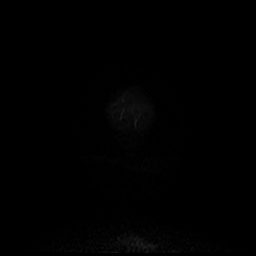

[Series 6: T2 · axial · 5.0mm · 0.43mm/px · z∈[-72,+71]mm · 2 of 25 slices shown]
[im 1/25]
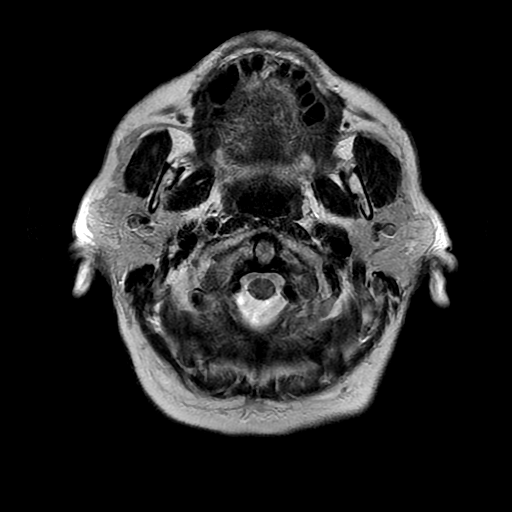
[im 25/25]
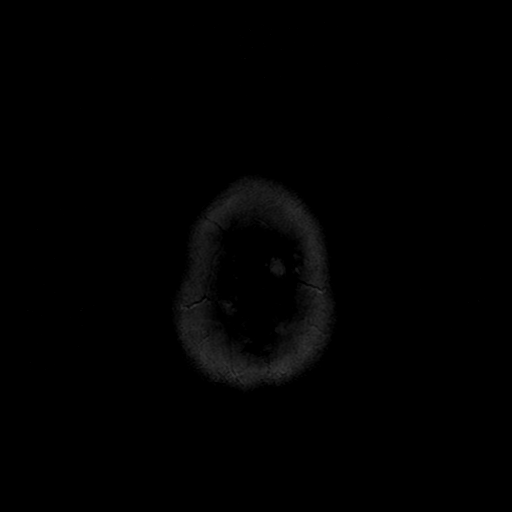

[Series 7: FLAIR · axial · 3.0mm · 0.43mm/px · z∈[-81,+74]mm · 2 of 27 slices shown]
[im 1/27]
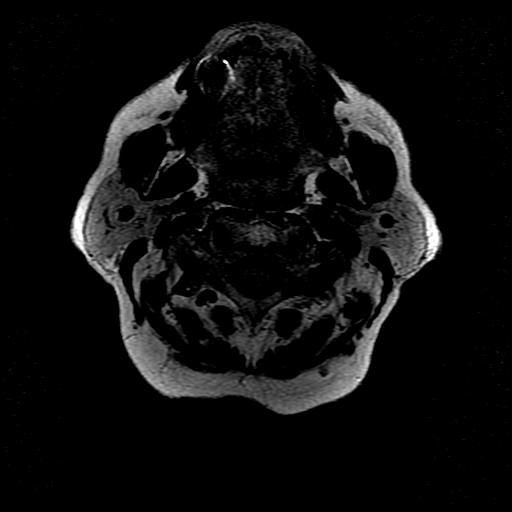
[im 27/27]
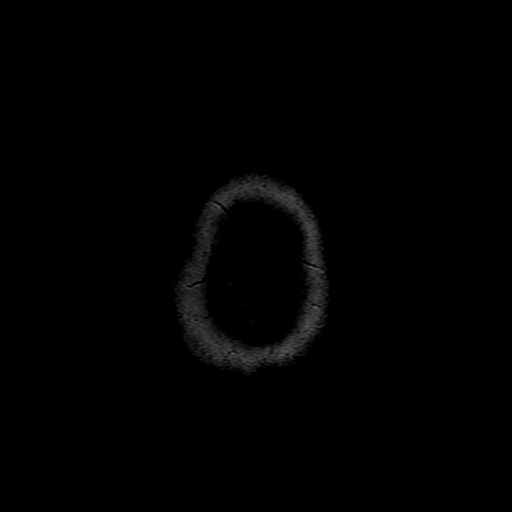

[Series 10: T1 post-contrast · sagittal · 5.0mm · 0.47mm/px · 2 of 25 slices shown (1 of 3)]
[im 1/25]
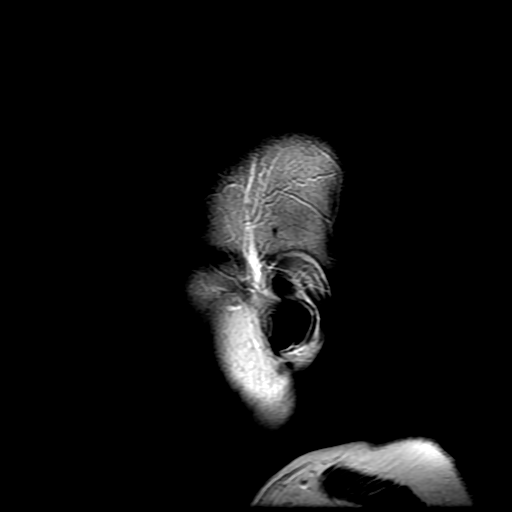
[im 25/25]
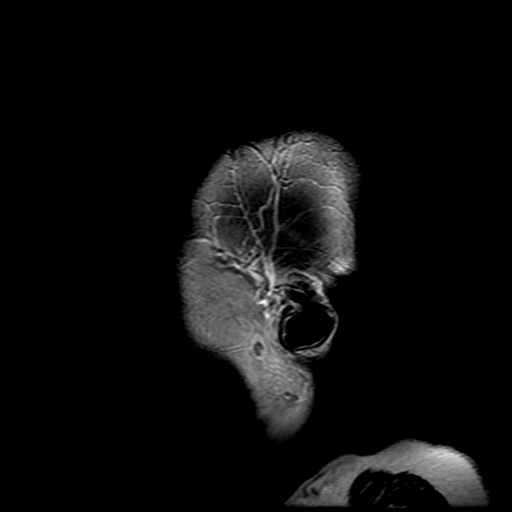

[Series 11: T2 post-contrast · coronal · 5.0mm · 0.39mm/px · 1 of 29 slices shown]
[im 1/29]
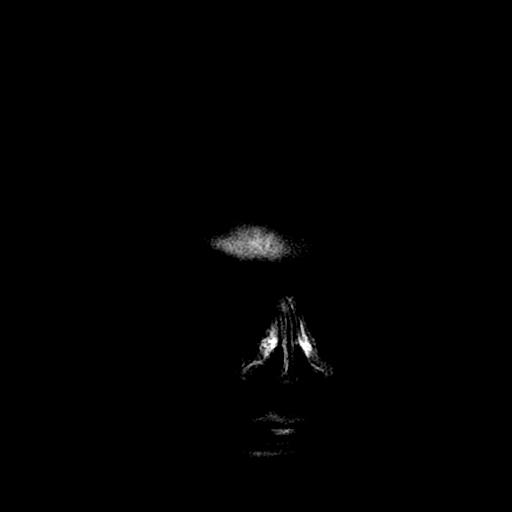

[Series 12: T1 post-contrast · axial · 3.0mm · 0.47mm/px · z∈[-56,+97]mm · 4 of 52 slices shown (2 of 3)]
[im 1/52]
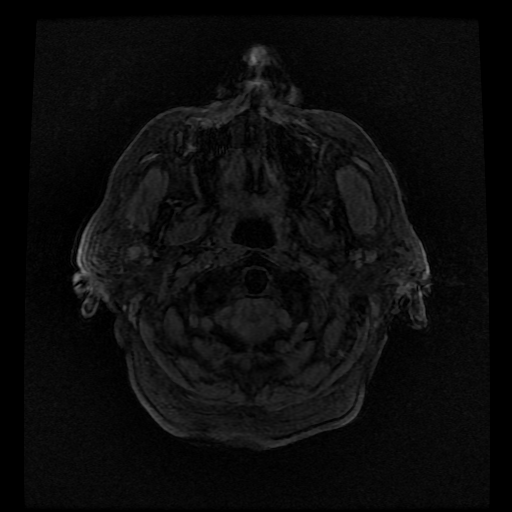
[im 18/52]
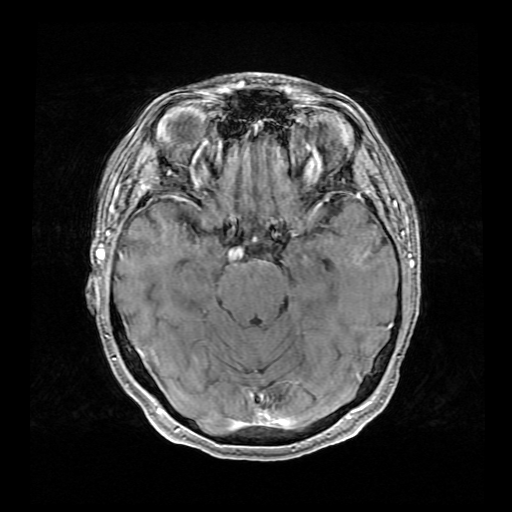
[im 35/52]
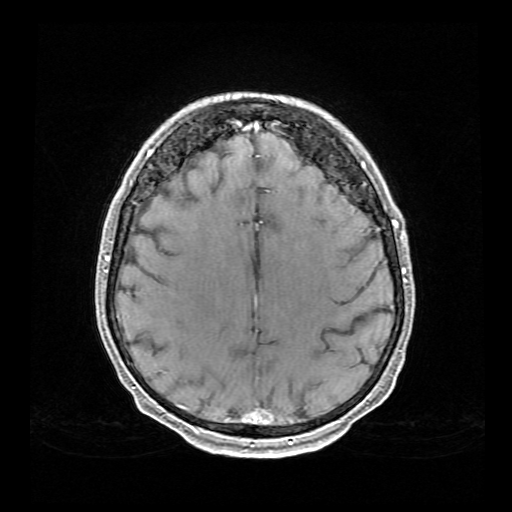
[im 52/52]
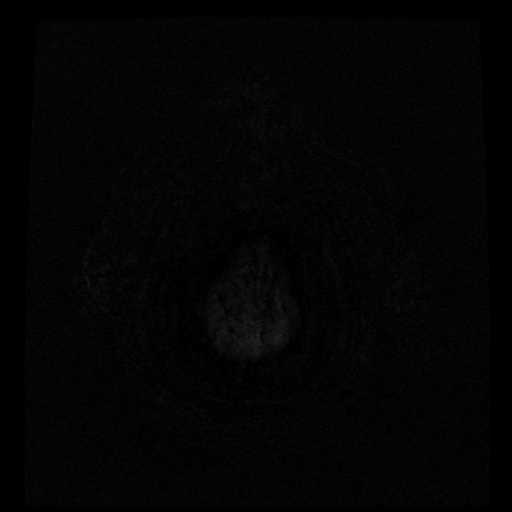

[Series 13: T1 post-contrast · coronal · 5.0mm · 0.39mm/px · 2 of 29 slices shown (3 of 3)]
[im 1/29]
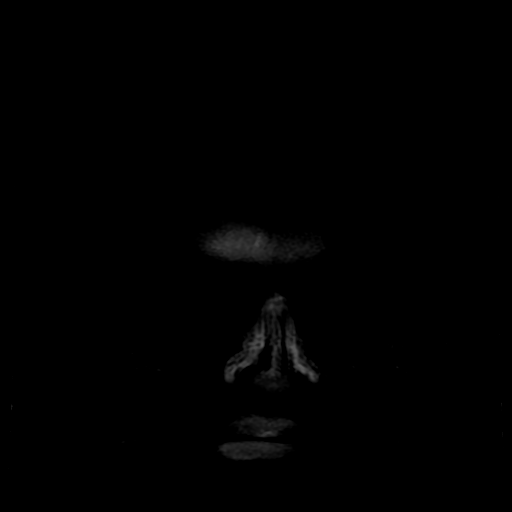
[im 29/29]
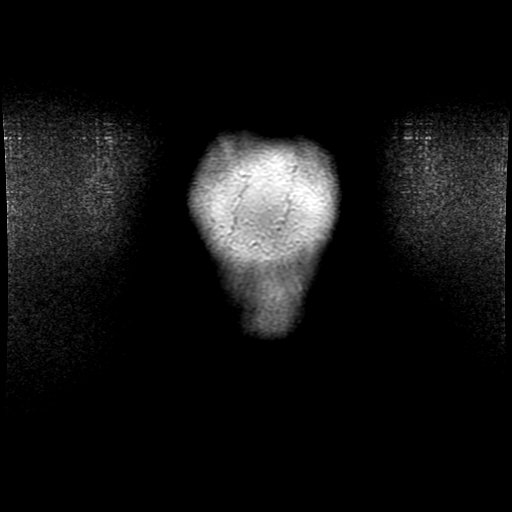

[Series 300: DWI · axial · 3.0mm · 1.09mm/px · z∈[-81,+67]mm · 4 of 51 slices shown (3 of 4)]
[im 1/51]
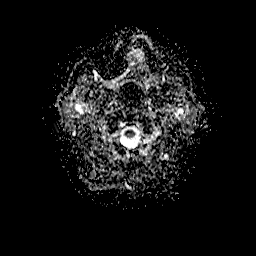
[im 17/51]
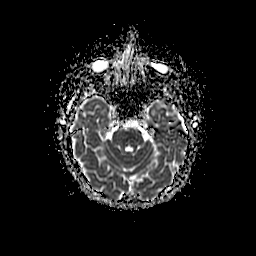
[im 34/51]
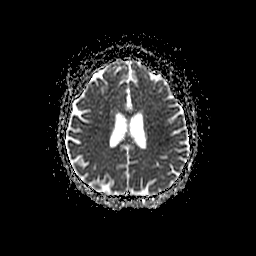
[im 51/51]
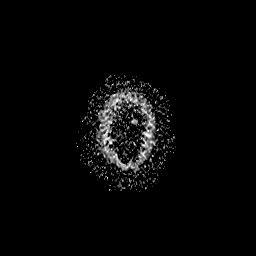

[Series 400: DWI · coronal · 5.0mm · 1.09mm/px · 3 of 38 slices shown (4 of 4)]
[im 1/38]
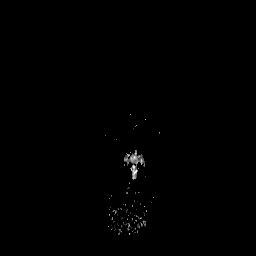
[im 19/38]
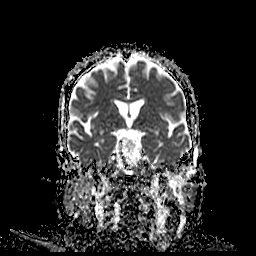
[im 38/38]
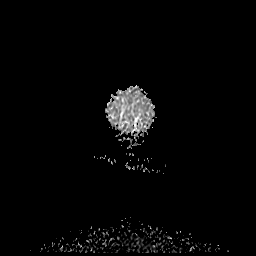

[35 of 48 positions shown; findings below may reference images not displayed]

FINDINGS: Brain: No restricted diffusion to suggest acute or subacute infarct.
No acute hemorrhage, hydrocephalus, extra-axial collection, or mass
lesion. The ventricles and sulci are within normal limits for age.
No abnormal enhancement.

Vascular: Normal flow voids.

Skull and upper cervical spine: Normal marrow signal.

Sinuses/Orbits: Mucosal thickening in the right maxillary sinus. The
orbits are unremarkable.

Other: Trace fluid in left mastoid air cells.
IMPRESSION: No acute intracranial process. No etiology is seen for the patient's
headache.

## 2023-08-14 ENCOUNTER — Other Ambulatory Visit: Payer: Self-pay | Admitting: Family Medicine

## 2023-08-14 IMAGING — US US IMAGE GUIDED DRAINAGE BY PERCUTANEOUS CATHETER
1 series · 13 of 18 positions shown · non-contrast
Comparison: CT CAP, 10/13/2021.

INDICATION: Infection, unknown etiology. RIGHT breast and axillary collections
on CT.

EXAM:
US-GUIDED DRAINAGE OF RIGHT AXILLARY FLUID COLLECTION

[Series 1: us image guided drainage by percutaneous catheter · 13 of 18 slices shown]
[im 1/18]
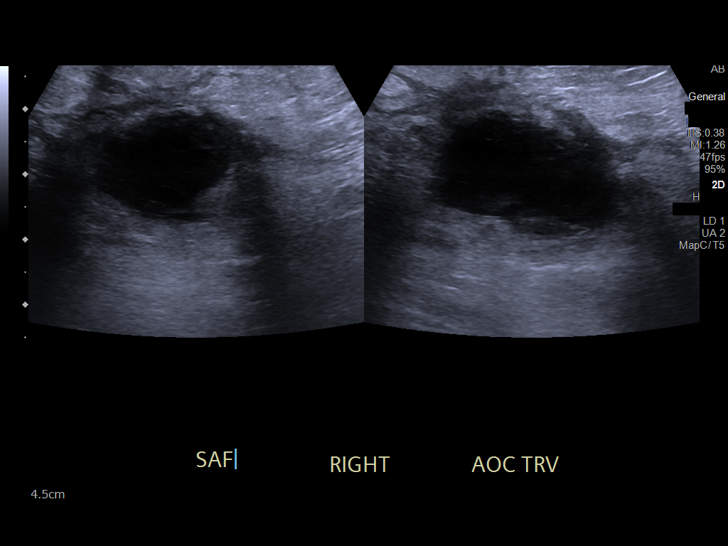
[im 3/18]
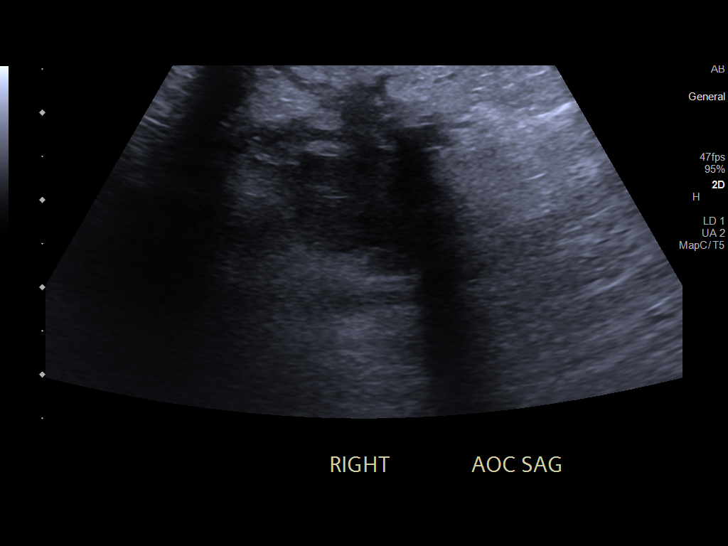
[im 4/18]
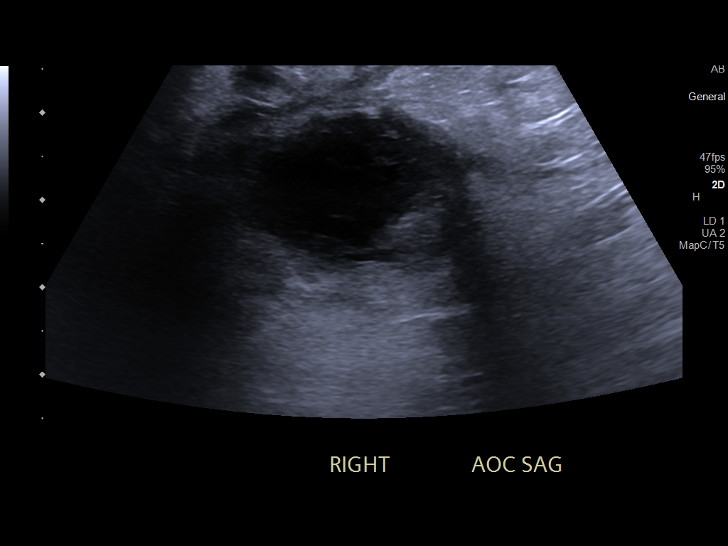
[im 5/18]
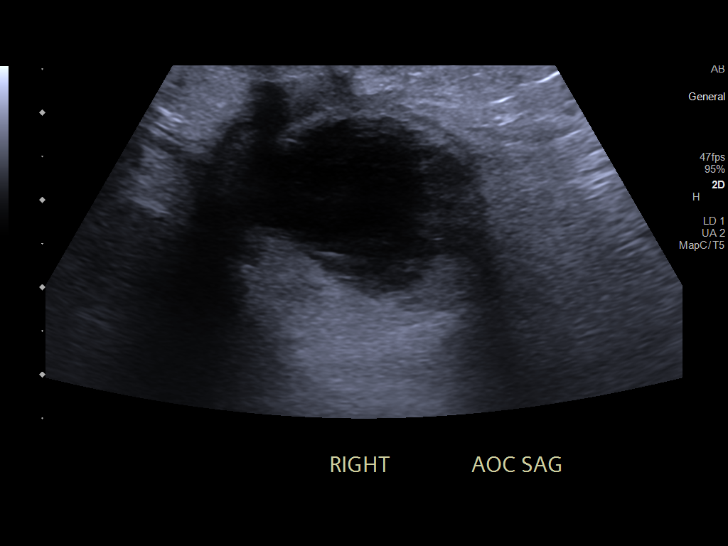
[im 7/18]
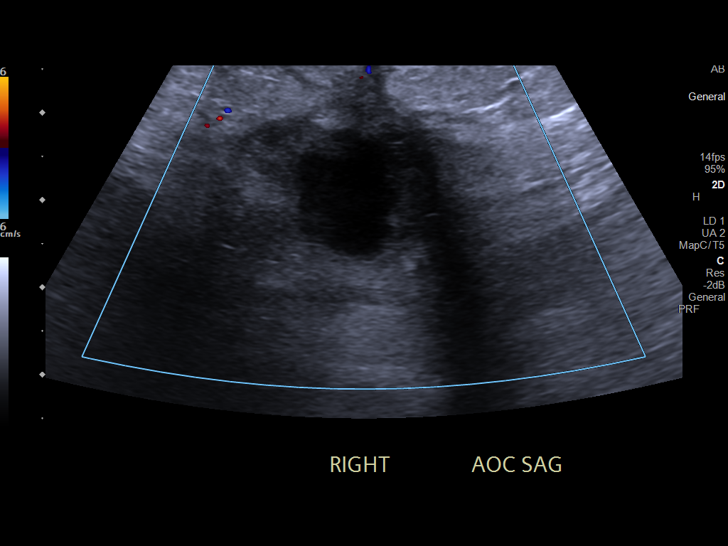
[im 8/18]
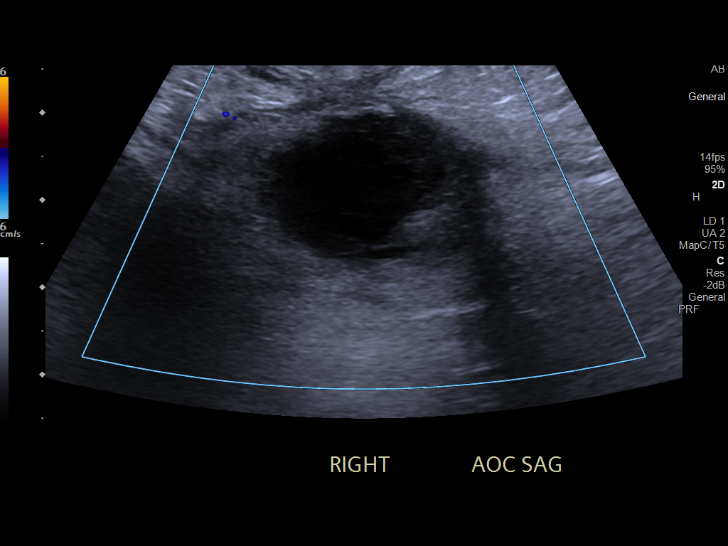
[im 10/18]
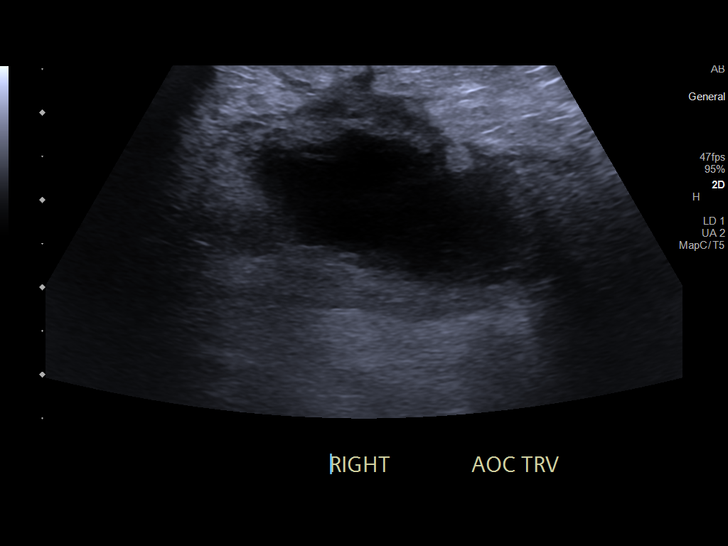
[im 11/18]
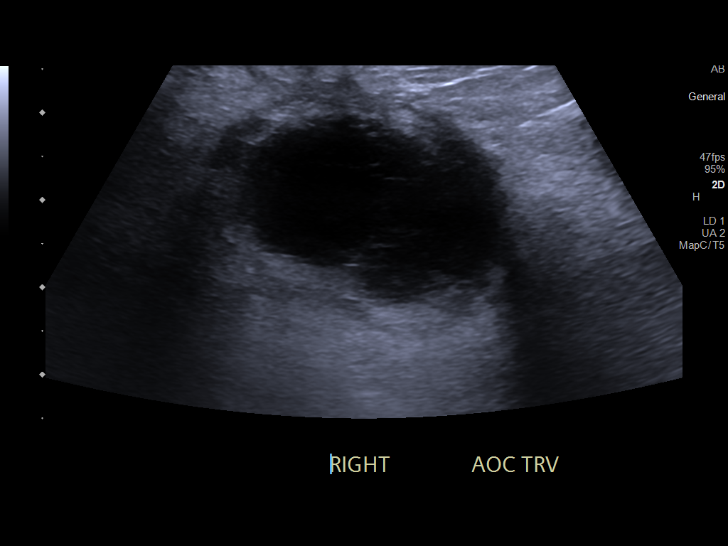
[im 12/18]
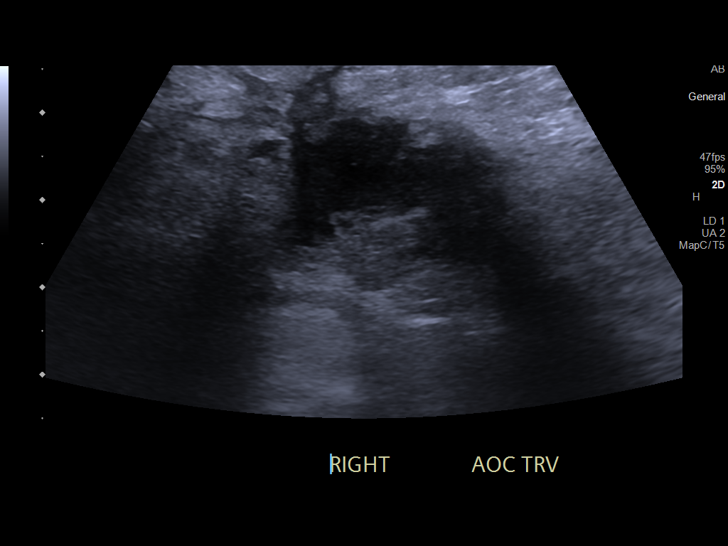
[im 14/18]
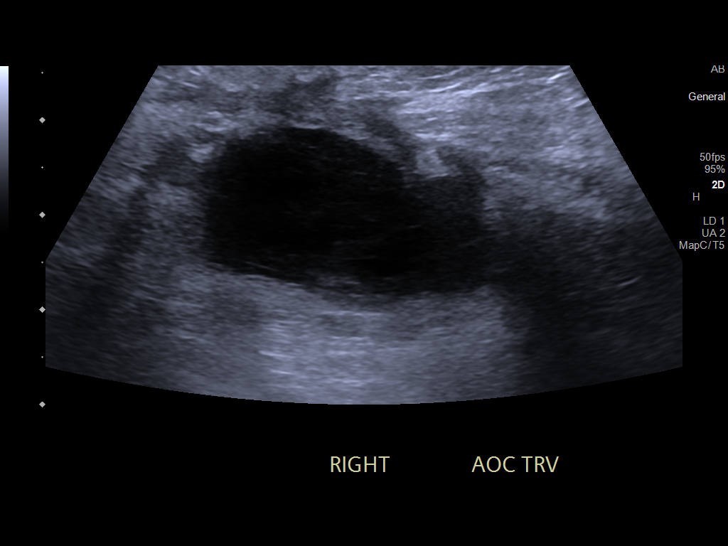
[im 15/18]
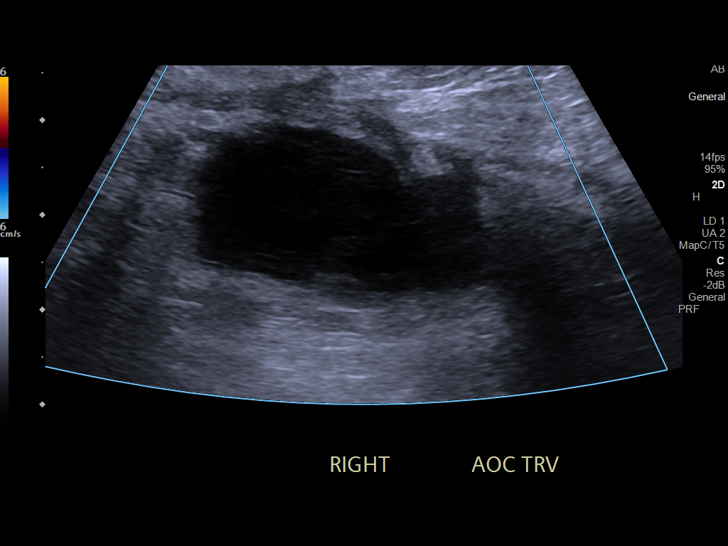
[im 16/18]
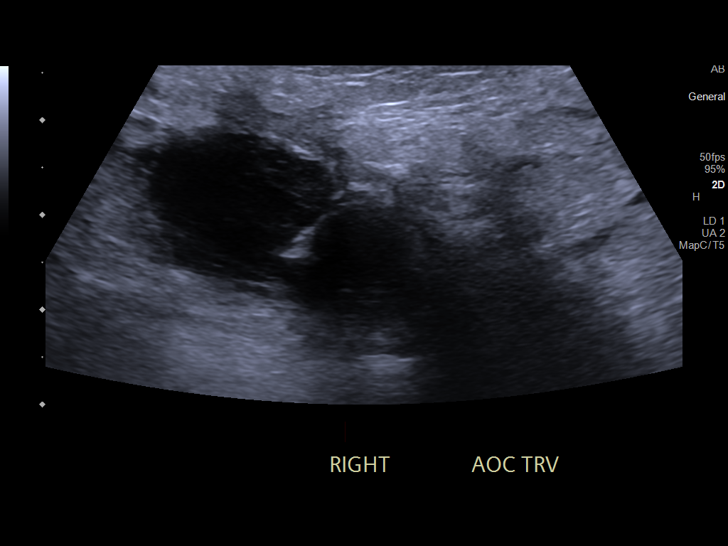
[im 18/18]
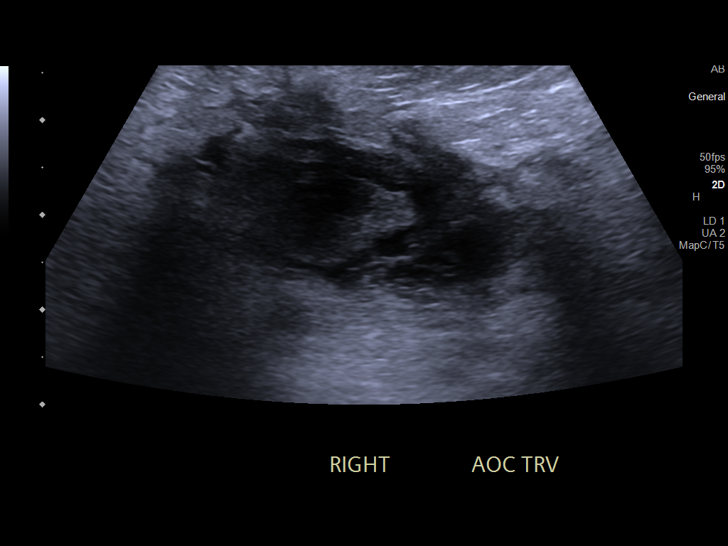

[13 of 18 positions shown; findings below may reference images not displayed]

MEDICATIONS:
The patient is currently admitted to the hospital and receiving
intravenous antibiotics. The antibiotics were administered within an
appropriate time frame prior to the initiation of the procedure.

ANESTHESIA/SEDATION:
Local anesthetic was administered.

CONTRAST:  None

COMPLICATIONS:
None immediate.

PROCEDURE:
Informed written consent was obtained from the patient after a
discussion of the risks, benefits and alternatives to treatment.
Preprocedural ultrasound scanning demonstrated well-circumscribed,
mildly complex RIGHT axillary fluid collection. A timeout was
performed prior to the initiation of the procedure.

The RIGHT chest and axilla was prepped and draped in the usual
sterile fashion. The overlying soft tissues were anesthetized with
1% lidocaine with epinephrine. Under direct ultrasound guidance, a
18 gauge 10 cm Yueh needle was advanced into the abscess/fluid
collection. Multiple ultrasound images were saved for procedural
documentation purposes.

Next, approximately 5 mL serous fluid was aspirated from the
collection. A representative sample of aspirated fluid was capped
and sent to the laboratory for analysis.

The needle was removed and superficial hemostasis was achieved with
manual compression. A dressing was placed. The patient tolerated the
procedure well without immediate postprocedural complication.
IMPRESSION: Successful US-guided aspiration of RIGHT axillary fluid collection.

A representative aspirated sample was sent to the laboratory as
requested by the ordering clinical team.

## 2023-09-20 NOTE — Progress Notes (Unsigned)
Patient Care Team: Everrett Coombe, DO as PCP - General (Family Medicine) Meriam Sprague, MD (Inactive) as PCP - Cardiology (Cardiology) Regan Lemming, MD as PCP - Electrophysiology (Cardiology) Quintella Reichert, MD as PCP - Sleep Medicine (Cardiology) Almond Lint, MD as Consulting Physician (General Surgery) Malachy Mood, MD as Consulting Physician (Hematology) Lonie Peak, MD as Attending Physician (Radiation Oncology) Kennon Rounds as Physician Assistant (Cardiology) Pollyann Samples, NP as Nurse Practitioner (Nurse Practitioner)   CHIEF COMPLAINT: Follow up right breast cancer   Oncology History Overview Note  Cancer Staging Malignant neoplasm of upper-outer quadrant of right breast in female, estrogen receptor positive (HCC) Staging form: Breast, AJCC 8th Edition - Clinical stage from 11/22/2020: Stage IA (cT1b, cN0, cM0, G2, ER+, PR+, HER2-) - Signed by Malachy Mood, MD on 12/04/2020 Stage prefix: Initial diagnosis - Pathologic: Stage IA (pT1c, pN0, cM0, G2, ER+, PR+, HER2-) - Signed by Loa Socks, NP on 02/27/2021 Histologic grading system: 3 grade system    Malignant neoplasm of upper-outer quadrant of right breast in female, estrogen receptor positive (HCC)  11/05/2020 Breast US   US Breast 11/05/20  IMPRESSION No Sonographic correlate is seen for the round microlobulated 9mm mass in the upper outer right breast 10cm from the nipple, 9: 00 position.  Given this mass is new on mammography and has suspicious borders, a stereotactic guided biopsy is recommended.     11/22/2020 Cancer Staging   Staging form: Breast, AJCC 8th Edition - Clinical stage from 11/22/2020: Stage IA (cT1b, cN0, cM0, G2, ER+, PR+, HER2-) - Signed by Malachy Mood, MD on 12/04/2020   11/22/2020 Initial Biopsy    Diagnosis 11/22/20  Breast, right, needle core biopsy, 10 cmfn, upper outer quadrant, post depth - INVASIVE DUCTAL CARCINOMA. SEE NOTE Diagnosis Note Carcinoma measures  0.7 cm in greatest linear dimension and appears grade 2. Dr. Berneice Heinrich reviewed the case and concurs with the diagnosis. A breast prognostic profile (ER, PR, Ki-67 and HER2) is pending and will be reported in an addendum. Dr. Juanetta Gosling was notified on 11/23/2020.   11/22/2020 Receptors her2    ROGNOSTIC INDICATORS Results: IMMUNOHISTOCHEMICAL AND MORPHOMETRIC ANALYSIS PERFORMED MANUALLY The tumor cells are NEGATIVE for Her2 (1+). Estrogen Receptor: 95%, POSITIVE, STRONG STAINING INTENSITY Progesterone Receptor: 95%, POSITIVE, STRONG STAINING INTENSITY Proliferation Marker Ki67: 10%   11/28/2020 Initial Diagnosis   Malignant neoplasm of upper-outer quadrant of right breast in female, estrogen receptor positive (HCC)   12/22/2020 Genetic Testing   Negative hereditary cancer genetic testing: no pathogenic variants detected in [facility] Common Hereditary Cancers Panel + Melanoma Genes.  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  The report date is December 22, 2020.    The Common Hereditary Cancers Panel+Melanoma Genes offered by [facility] includes sequencing and/or deletion duplication testing of the following 52 genes: APC, ATM, AXIN2, BAP1, BARD1, BMPR1A, BRCA1, BRCA2, BRIP1, CDH1, CDK4, CDKN2A (p14ARF), CDKN2A (p16INK4a), CHEK2, CTNNA1, DICER1, EPCAM (Deletion/duplication testing only), GREM1 (promoter region deletion/duplication testing only), HOXB13, KIT, MEN1, MITF, MLH1, MSH2, MSH3, MSH6, MUTYH, NBN, NF1, NHTL1, PALB2, PDGFRA, PMS2, POLD1, POLE, PTEN, POT1, RAD50, RAD51C, RAD51D, RB1, RNF43, SDHA, SDHB, SDHC, SDHD, SMAD4, SMARCA4, STK11, TP53, TSC1, TSC2, and VHL.  The following genes were evaluated for sequence changes only: SDHA and HOXB13 c.251G>A variant only.   02/18/2021 Surgery   FINAL MICROSCOPIC DIAGNOSIS:   A. BREAST, RIGHT, LUMPECTOMY:  - Invasive ductal carcinoma, 1.2 cm, grade 2  - Ductal carcinoma in situ, intermediate  grade  - Resection margins are  negative for carcinoma; posterior margin is less than 1 mm from carcinoma  - Biopsy site changes  - See oncology table   B. LYMPH NODE, RIGHT AXILLARY #1, SENTINEL, EXCISION:  - Lymph node, negative for carcinoma (0/1)   C. LYMPH NODE, RIGHT AXILLARY #2, SENTINEL, EXCISION:  - Lymph node, negative for carcinoma (0/1)   D. LYMPH NODE, RIGHT AXILLARY, SENTINEL, EXCISION:  - Lymph node, negative for carcinoma (0/1)   E. LYMPH NODE, RIGHT AXILLARY #3, SENTINEL, EXCISION:  - Lymph node, negative for carcinoma (0/1)   F. LYMPH NODE, RIGHT AXILLARY, SENTINEL, EXCISION:  - Lymph node, negative for carcinoma (0/1)     02/18/2021 Oncotype testing   Oncotype DX was obtained on the final surgical sample and the recurrence score of 0 predicts a risk of recurrence outside the breast over the next 9 years of 3%, if the patient's only systemic therapy is an antiestrogen for 5 years.  It also predicts no benefit from chemotherapy.   02/27/2021 Cancer Staging   Staging form: Breast, AJCC 8th Edition - Pathologic: Stage IA (pT1c, pN0, cM0, G2, ER+, PR+, HER2-) - Signed by Loa Socks, NP on 02/27/2021 Histologic grading system: 3 grade system   03/2021 - 04/2021 Radiation Therapy   Completed adjuvant Radiation by Dr. Basilio Cairo   05/2021 -  Neo-Adjuvant Anti-estrogen oral therapy   Began tamoxifen mid July 2022   07/29/2021 Survivorship   SCP delivered by Santiago Glad, NP      CURRENT THERAPY: Tamoxifen 20 mg, starting 05/2021  INTERVAL HISTORY Ms. Pierrelouis returns for follow up as scheduled. Last seen by me 03/30/23.  She is doing fairly well in general, some good days and bad.  She continues to have activity intolerance, fatigue, and daytime tiredness.  Uses oxygen at night but still wakes up with low O2.  Plans to call cardiology but not sure which provider to contact.  She feels a little or since her heart is in rhythm.  Doing well from a breast cancer standpoint, tolerating tamoxifen with  mild hot flashes and her "usual" joint pains.  Denies breast concerns or changes such as new lump/mass, nipple discharge or inversion, or skin change.  ROS  All other systems reviewed and negative  Past Medical History:  Diagnosis Date   Allergy 1978   Food allergies, seasonal allergies, medicine allergies   Anemia    Arthritis    Asthma    Blood transfusion without reported diagnosis 1985   Breast cancer (HCC)    Diabetes (HCC)    Dyspnea    Dysrhythmia    afibb on occasion   Family history of adverse reaction to anesthesia    mother had reaction to versed...during surgery, stopped breathing   Fibromyalgia    GERD (gastroesophageal reflux disease)    Headache    History of hiatal hernia    Hyperlipidemia 1990?   Hypertension    Migraines    Osteopenia    Osteoporosis    Oxygen deficiency 2023   Psoriasis    Sleep apnea 2023   Yeast infection 10/30/2021     Past Surgical History:  Procedure Laterality Date   ABDOMINAL HYSTERECTOMY     APPENDECTOMY     ATRIAL FIBRILLATION ABLATION N/A 12/10/2021   Procedure: ATRIAL FIBRILLATION ABLATION;  Surgeon: Regan Lemming, MD;  Location: [facility] INVASIVE CV [facility];  Service: Cardiovascular;  Laterality: N/A;   BREAST LUMPECTOMY WITH RADIOACTIVE SEED AND SENTINEL LYMPH NODE  BIOPSY Right 02/18/2021   Procedure: RIGHT BREAST LUMPECTOMY WITH RADIOACTIVE SEED AND SENTINEL LYMPH NODE BIOPSY;  Surgeon: Almond Lint, MD;  Location: [facility] OR;  Service: General;  Laterality: Right;   BREAST SURGERY  2022   broken wrist     CARDIOVERSION N/A 04/04/2021   Procedure: CARDIOVERSION;  Surgeon: Quintella Reichert, MD;  Location: [facility] ENDOSCOPY;  Service: Cardiovascular;  Laterality: N/A;   CARDIOVERSION N/A 08/14/2021   Procedure: CARDIOVERSION;  Surgeon: Sande Rives, MD;  Location: [facility] ENDOSCOPY;  Service: Cardiovascular;  Laterality: N/A;   CARDIOVERSION N/A 09/19/2021   Procedure: CARDIOVERSION;  Surgeon: Sande Rives, MD;   Location: [facility] ENDOSCOPY;  Service: Cardiovascular;  Laterality: N/A;   CESAREAN SECTION  1976 and 1977   CHOLECYSTECTOMY     FRACTURE SURGERY     right wrist   HYSTERECTOMY ABDOMINAL WITH SALPINGO-OOPHORECTOMY     LYSIS OF ADHESION     SHOULDER SURGERY Left    TUBAL LIGATION       Outpatient Encounter Medications as of 09/22/2023  Medication Sig   acetaminophen (TYLENOL) 500 MG tablet Take 500 mg by mouth every 6 (six) hours as needed for moderate pain, headache or mild pain.   apixaban (ELIQUIS) 5 MG TABS tablet TAKE ONE TABLET BY MOUTH TWICE DAILY   calcipotriene (DOVONOX) 0.005 % cream Apply 1 application topically See admin instructions. Mix with fluorouracil 5% cream and apply topically twice daily for five days - as needed for precancerous on right cheek.   Calcium Carbonate (CALCIUM 600 PO) Take by mouth.   cetirizine (ZYRTEC) 10 MG tablet Take 10 mg by mouth in the morning.   cholecalciferol (VITAMIN D3) 25 MCG (1000 UNIT) tablet Take 1,000-2,000 Units by mouth See admin instructions. Take 1000 units in the morning and 2000 units at bedtime   ferrous sulfate 325 (65 FE) MG tablet Take 325 mg by mouth every morning.   fluorouracil (EFUDEX) 5 % cream Apply 1 application  topically 2 (two) times daily as needed (precancerous spots on right temple and left eyebrow). Mix with calcipotriene 0.005% cream and apply topically twice daily for five days - as needed for precancerous on right temple and left eyebrow   gabapentin (NEURONTIN) 100 MG capsule TAKE THREE CAPSULES BY MOUTH AT BEDTIME   ibuprofen (ADVIL) 200 MG tablet Take 200 mg by mouth every 6 (six) hours as needed for mild pain (pain score 1-3).   losartan-hydrochlorothiazide (HYZAAR) 100-25 MG tablet Take 1 tablet by mouth daily.   metFORMIN (GLUCOPHAGE-XR) 500 MG 24 hr tablet TAKE ONE TABLET BY MOUTH EVERY DAY   metoprolol succinate (TOPROL-XL) 100 MG 24 hr tablet Take 1 tablet (100 mg total) by mouth daily. Take with or immediately  following a meal.   pantoprazole (PROTONIX) 40 MG tablet TAKE ONE TABLET BY MOUTH TWICE DAILY   Prenatal Vit-Fe Fumarate-FA (PRENATAL VITAMIN PLUS LOW IRON PO) Take 1 tablet by mouth in the morning.   triamcinolone (KENALOG) 0.1 % Apply 1 application topically 2 (two) times daily as needed (psoriasis). Mixed with Cetaphil 1:1   UNABLE TO FIND OUTPATIENT PHYSICAL THERAPY    Diagnosis: Generalized debility due to UTI, acute gastroenteritis   venlafaxine XR (EFFEXOR-XR) 37.5 MG 24 hr capsule Take 1 capsule (37.5 mg total) by mouth at bedtime.   venlafaxine XR (EFFEXOR-XR) 75 MG 24 hr capsule TAKE ONE CAPSULE BY MOUTH EVERY DAY   vitamin C (ASCORBIC ACID) 500 MG tablet Take 500 mg by mouth daily.   [DISCONTINUED]  potassium chloride SA (KLOR-CON M20) 20 MEQ tablet Take 1 tablet (20 mEq total) by mouth daily.   [DISCONTINUED] tamoxifen (NOLVADEX) 20 MG tablet Take 1 tablet (20 mg total) by mouth every morning.   potassium chloride SA (KLOR-CON M20) 20 MEQ tablet Take 1 tablet (20 mEq total) by mouth daily.   tamoxifen (NOLVADEX) 20 MG tablet Take 1 tablet (20 mg total) by mouth every morning.   No facility-administered encounter medications on file as of 09/22/2023.     Today's Vitals   09/22/23 0933 09/22/23 0939  BP: 127/73   Pulse: 62   Resp: 18   Temp: 98 F (36.7 C)   TempSrc: Oral   SpO2: 93%   Weight: 210 lb 3.2 oz (95.3 kg)   Height: 5' 1.5" (1.562 m)   PainSc:  3    Body mass index is 39.07 kg/m.   PHYSICAL EXAM GENERAL:alert, no distress and comfortable SKIN: no rash  EYES: sclera clear NECK: without mass LYMPH:  no palpable cervical or supraclavicular lymphadenopathy  LUNGS: clear with normal breathing effort HEART: regular rate & rhythm, no lower extremity edema ABDOMEN: abdomen soft, non-tender and normal bowel sounds NEURO: alert & oriented x 3 with fluent speech Breast exam: s/p R lumpectomy, incisions completely healed. No palpable mass or nodularity in either  breast or axilla that I could appreciate  CBC    Component Value Date/Time   WBC 5.6 09/22/2023 0912   WBC 4.8 10/08/2022 1042   RBC 3.68 (L) 09/22/2023 0912   HGB 11.6 (L) 09/22/2023 0912   HGB 12.1 11/25/2021 1459   HCT 35.8 (L) 09/22/2023 0912   HCT 36.1 11/25/2021 1459   PLT 168 09/22/2023 0912   PLT 250 11/25/2021 1459   MCV 97.3 09/22/2023 0912   MCV 91 11/25/2021 1459   MCH 31.5 09/22/2023 0912   MCHC 32.4 09/22/2023 0912   RDW 12.9 09/22/2023 0912   RDW 16.0 (H) 11/25/2021 1459   LYMPHSABS 1.3 09/22/2023 0912   MONOABS 0.5 09/22/2023 0912   EOSABS 0.1 09/22/2023 0912   BASOSABS 0.1 09/22/2023 0912     CMP     Component Value Date/Time   NA 139 09/22/2023 0912   NA 139 11/25/2021 1459   K 3.7 09/22/2023 0912   CL 99 09/22/2023 0912   CO2 32 09/22/2023 0912   GLUCOSE 165 (H) 09/22/2023 0912   BUN 19 09/22/2023 0912   BUN 23 11/25/2021 1459   CREATININE 0.80 09/22/2023 0912   CREATININE 0.74 10/08/2022 1042   CALCIUM 8.9 09/22/2023 0912   PROT 7.3 09/22/2023 0912   ALBUMIN 4.0 09/22/2023 0912   AST 74 (H) 09/22/2023 0912   ALT 36 09/22/2023 0912   ALKPHOS 72 09/22/2023 0912   BILITOT 0.4 09/22/2023 0912   GFRNONAA >60 09/22/2023 0912     ASSESSMENT & PLAN:Donnie P Somes is a 72 y.o. post-hysterectomy female with    1. Malignant neoplasm of upper-outer quadrant of right breast, Stage IA, (pT1c, pN0), ER+/PR+/HER2-, Grade II  -found on screening mammogram. S/p right lumpectomy on 02/18/21 under Dr. Donell Beers, path showed 1.2 cm IDC with DCIS. Margins and lymph nodes negative -Oncotype DX score of 0. -s/p radiation under Dr. Basilio Cairo 04/09/21 - 05/07/21. -she started tamoxifen in 05/2021, goal 5 years. She is tolerating well except some vaginal dryness/discharge.  -last mammogram at [facility] in 11/03/2022 was benign -Ms. Bara is clinically doing well.  Exam is benign, CBC is stable, we will follow-up the pending labs from today.  Overall no clinical concern for  recurrence -Continue breast cancer surveillance and tamoxifen.   -Next mammo scheduled 11/05/2023 at [facility] -Follow-up in 6 months, or sooner if needed   2. Chest tightness, exertional dyspnea -Onset > 6 months  -She does have OSA on Bipap and uses oxygen at night -History of A-fib, regular rhythm today, compliant with Eliquis -Continues to have low oxygen at home, activity intolerance, and occasional chest tightness with dyspnea on exertion -I will CC my note to cardiology for follow-up and encouraged patient to call as well   3. Anemia, B12 and iron deficiencies -pt is s/p hysterectomy, last colonoscopy benign per pt (recall due 2025) -Received IV Feraheme during hospitalization on 10/14/21 -last iron panel, ferritin, and B12 on 02/21/22 were WNL. -hgb fluctuates. She endorses taking B12, oral iron with vit C source, and prenatal vitamin. -Today's levels are pending    4. Comorbidities: Chronic joint pain, DM, HTN, fibromyalgia, GERD, HLD, osteopenia, Neuropathy -her joint pain is in b/l shoulders, left knee and hip with very limited ROM of left shoulder. -Most recent DEXA scan 08/21/21 showed T-score -1.3. -She is taking a vitamin D supplement and has been encouraged to take calcium -F/up PCP       PLAN: -CBC reviewed, we will follow-up the pending labs from today -Continue breast cancer surviellance and tamoxifen -Mammo 11/05/23 at [facility] as scheduled -Continue oral iron with vit C source and B12/prenatal vitamin -CC note to cardiology for f/up -RTC with lab in 6 months, or sooner if needed    All questions were answered. The patient knows to call the clinic with any problems, questions or concerns. No barriers to learning were detected.   Santiago Glad, NP-C 09/22/2023

## 2023-09-22 ENCOUNTER — Inpatient Hospital Stay: Payer: Medicare Other | Attending: Hematology

## 2023-09-22 ENCOUNTER — Inpatient Hospital Stay (HOSPITAL_BASED_OUTPATIENT_CLINIC_OR_DEPARTMENT_OTHER): Payer: Medicare Other | Admitting: Nurse Practitioner

## 2023-09-22 ENCOUNTER — Encounter: Payer: Self-pay | Admitting: Nurse Practitioner

## 2023-09-22 DIAGNOSIS — Z79810 Long term (current) use of selective estrogen receptor modulators (SERMs): Secondary | ICD-10-CM | POA: Diagnosis not present

## 2023-09-22 DIAGNOSIS — Z7984 Long term (current) use of oral hypoglycemic drugs: Secondary | ICD-10-CM | POA: Insufficient documentation

## 2023-09-22 DIAGNOSIS — M810 Age-related osteoporosis without current pathological fracture: Secondary | ICD-10-CM | POA: Diagnosis not present

## 2023-09-22 DIAGNOSIS — D649 Anemia, unspecified: Secondary | ICD-10-CM | POA: Insufficient documentation

## 2023-09-22 DIAGNOSIS — C50411 Malignant neoplasm of upper-outer quadrant of right female breast: Secondary | ICD-10-CM

## 2023-09-22 DIAGNOSIS — J45909 Unspecified asthma, uncomplicated: Secondary | ICD-10-CM | POA: Insufficient documentation

## 2023-09-22 DIAGNOSIS — Z923 Personal history of irradiation: Secondary | ICD-10-CM | POA: Insufficient documentation

## 2023-09-22 DIAGNOSIS — R232 Flushing: Secondary | ICD-10-CM | POA: Insufficient documentation

## 2023-09-22 DIAGNOSIS — E785 Hyperlipidemia, unspecified: Secondary | ICD-10-CM | POA: Diagnosis not present

## 2023-09-22 DIAGNOSIS — M797 Fibromyalgia: Secondary | ICD-10-CM | POA: Diagnosis not present

## 2023-09-22 DIAGNOSIS — Z170 Estrogen receptor positive status [ER+]: Secondary | ICD-10-CM

## 2023-09-22 DIAGNOSIS — Z7901 Long term (current) use of anticoagulants: Secondary | ICD-10-CM | POA: Diagnosis not present

## 2023-09-22 DIAGNOSIS — K219 Gastro-esophageal reflux disease without esophagitis: Secondary | ICD-10-CM | POA: Insufficient documentation

## 2023-09-22 DIAGNOSIS — Z9981 Dependence on supplemental oxygen: Secondary | ICD-10-CM | POA: Insufficient documentation

## 2023-09-22 DIAGNOSIS — R0789 Other chest pain: Secondary | ICD-10-CM | POA: Insufficient documentation

## 2023-09-22 DIAGNOSIS — M2550 Pain in unspecified joint: Secondary | ICD-10-CM | POA: Insufficient documentation

## 2023-09-22 DIAGNOSIS — I10 Essential (primary) hypertension: Secondary | ICD-10-CM | POA: Diagnosis not present

## 2023-09-22 DIAGNOSIS — E119 Type 2 diabetes mellitus without complications: Secondary | ICD-10-CM | POA: Insufficient documentation

## 2023-09-22 DIAGNOSIS — D509 Iron deficiency anemia, unspecified: Secondary | ICD-10-CM

## 2023-09-22 DIAGNOSIS — E538 Deficiency of other specified B group vitamins: Secondary | ICD-10-CM

## 2023-09-22 LAB — CBC WITH DIFFERENTIAL (CANCER CENTER ONLY)
Abs Immature Granulocytes: 0.01 10*3/uL (ref 0.00–0.07)
Basophils Absolute: 0.1 10*3/uL (ref 0.0–0.1)
Basophils Relative: 1 %
Eosinophils Absolute: 0.1 10*3/uL (ref 0.0–0.5)
Eosinophils Relative: 2 %
HCT: 35.8 % — ABNORMAL LOW (ref 36.0–46.0)
Hemoglobin: 11.6 g/dL — ABNORMAL LOW (ref 12.0–15.0)
Immature Granulocytes: 0 %
Lymphocytes Relative: 24 %
Lymphs Abs: 1.3 10*3/uL (ref 0.7–4.0)
MCH: 31.5 pg (ref 26.0–34.0)
MCHC: 32.4 g/dL (ref 30.0–36.0)
MCV: 97.3 fL (ref 80.0–100.0)
Monocytes Absolute: 0.5 10*3/uL (ref 0.1–1.0)
Monocytes Relative: 9 %
Neutro Abs: 3.5 10*3/uL (ref 1.7–7.7)
Neutrophils Relative %: 64 %
Platelet Count: 168 10*3/uL (ref 150–400)
RBC: 3.68 MIL/uL — ABNORMAL LOW (ref 3.87–5.11)
RDW: 12.9 % (ref 11.5–15.5)
WBC Count: 5.6 10*3/uL (ref 4.0–10.5)
nRBC: 0 % (ref 0.0–0.2)

## 2023-09-22 LAB — CMP (CANCER CENTER ONLY)
ALT: 36 U/L (ref 0–44)
AST: 74 U/L — ABNORMAL HIGH (ref 15–41)
Albumin: 4 g/dL (ref 3.5–5.0)
Alkaline Phosphatase: 72 U/L (ref 38–126)
Anion gap: 8 (ref 5–15)
BUN: 19 mg/dL (ref 8–23)
CO2: 32 mmol/L (ref 22–32)
Calcium: 8.9 mg/dL (ref 8.9–10.3)
Chloride: 99 mmol/L (ref 98–111)
Creatinine: 0.8 mg/dL (ref 0.44–1.00)
GFR, Estimated: >60 mL/min (ref >60)
Glucose, Bld: 165 mg/dL — ABNORMAL HIGH (ref 70–99)
Potassium: 3.7 mmol/L (ref 3.5–5.1)
Sodium: 139 mmol/L (ref 135–145)
Total Bilirubin: 0.4 mg/dL (ref <1.2)
Total Protein: 7.3 g/dL (ref 6.5–8.1)

## 2023-09-22 LAB — VITAMIN B12: Vitamin B-12: 734 pg/mL (ref 180–914)

## 2023-09-22 LAB — FERRITIN: Ferritin: 68 ng/mL (ref 11–307)

## 2023-09-22 LAB — IRON AND IRON BINDING CAPACITY (CC-WL,HP ONLY)
Iron: 68 ug/dL (ref 28–170)
Saturation Ratios: 12 % (ref 10.4–31.8)
TIBC: 580 ug/dL — ABNORMAL HIGH (ref 250–450)
UIBC: 512 ug/dL — ABNORMAL HIGH (ref 148–442)

## 2023-09-22 MED ORDER — POTASSIUM CHLORIDE CRYS ER 20 MEQ PO TBCR: 20.0000 meq | EXTENDED_RELEASE_TABLET | Freq: Every day | ORAL | 0 refills | Status: DC

## 2023-09-22 MED ORDER — TAMOXIFEN CITRATE 20 MG PO TABS: 20.0000 mg | ORAL_TABLET | Freq: Every morning | ORAL | 3 refills | Status: DC

## 2023-09-23 ENCOUNTER — Telehealth: Payer: Self-pay

## 2023-09-23 NOTE — Telephone Encounter (Signed)
Pt advised with VU and will continue taking her supplements

## 2023-09-23 NOTE — Telephone Encounter (Signed)
-----   Message from Pollyann Samples sent at 09/23/2023  2:39 PM EST ----- Please let pt know labs, no need IV iron at this time. Please continue oral supplements.   Thanks, Clayborn Heron NP

## 2023-09-23 NOTE — Telephone Encounter (Signed)
-----   Message from Armanda Magic sent at 09/22/2023  7:56 PM EST ----- I only follow her for OSA and follows with [facility] for her heart - recommend that you evaluate for PE given her underlying cancer.    Triage can you please get her in with one of the extenders to be seen for CP ----- Message ----- From: Pollyann Samples, NP Sent: 09/22/2023  10:34 AM EST To: Quintella Reichert, MD; Will Jorja Loa, MD; #  Cardiology,  Ms. Cannell is doing well from breast cancer standpoint but continue to report low O2 at home, activity intolerance, and chest tightness on exertion. I encouraged her to call but she is not sure which provider to contact. I am including you all to help her with f/up.   Thanks, Clayborn Heron NP with Dr. Mosetta Putt

## 2023-09-24 NOTE — Progress Notes (Signed)
Cardiology Office Note:  .   Date:  09/25/2023  ID:  Kayla Bennett, DOB 1951-11-07, MRN [id number] PCP: Everrett Coombe, DO  [facility] Providers Cardiologist:  Needs to be established   Meriam Sprague, MD (Inactive) Cardiology APP:  Beatrice Lecher, PA-C  Electrophysiologist:  Will Jorja Loa, MD  Sleep Medicine:  Armanda Magic, MD  }   }   History of Present Illness: .   Kayla Bennett is a 72 y.o. female history of atrial fibrillation, hypertension, with other history to include diabetes breast cancer and fibromyalgia.  She is being followed by Dr. Elberta Fortis, electrophysiologist status post ablation by Dr. Elberta Fortis on 12/10/2021.  She is also followed by Dr. Carolanne Grumbling for OSA on BiPAP.  She comes today with multiple complaints.  From a cardiac standpoint however she has been noticing that her heart rate has been elevated and she has been having chest tightness with walking.  She was seen by her PCP and metoprolol was increased to 100 mg daily which has helped.  She occasionally feels it elevated at times but better than what it had been (less than 100 bpm).  She notices chest tightness when she is up and walking.  She is not very active due to musculoskeletal issues to include chronic leg pain, chronic back pain, balance issues, and excessive fatigue.  She has been compliant with BiPAP although she does nap during the day as well and is often very tired.  She has not seen Dr. Mayford Knife for follow-up and wonders if she should have increased on her oxygen by BiPAP.  The patient states her oxygen level sometimes is less than 90 when she awakens in the morning.   ROS: As above otherwise negative.  Studies Reviewed: .    NM Stress Test 12/27/2020 The left ventricular ejection fraction is hyperdynamic (>65%). Nuclear stress EF: 74%. There was no ST segment deviation noted during stress. The study is normal. This is a low risk study.   Normal pharmacologic nuclear stress  test with no evidence for prior infarct or ischemia. Normal LVEF.    EKG Interpretation Date/Time: 09/25/2023    Ventricular Rate: 80 bpm   PR Interval: 212 ms   QRS Duration: 72 ms   QT Interval: 382/440        Text Interpretation: Sinus rhythm with first-degree AV block.     Physical Exam:   VS:  BP 126/68   Pulse 86   Ht 5' 1.2" (1.554 m)   Wt 211 lb 6.4 oz (95.9 kg)   SpO2 98%   BMI 39.68 kg/m    Wt Readings from Last 3 Encounters:  09/25/23 211 lb 6.4 oz (95.9 kg)  09/22/23 210 lb 3.2 oz (95.3 kg)  04/08/23 204 lb (92.5 kg)    GEN: Well nourished, well developed in no acute distress NECK: No JVD; No carotid bruits CARDIAC: RRR, no murmurs, rubs, gallops RESPIRATORY:  Clear to auscultation without rales, wheezing or rhonchi  ABDOMEN: Soft, non-tender, non-distended EXTREMITIES:  No edema; No deformity   ASSESSMENT AND PLAN: .    Rapid heart rhythm: Improved with increased dose of metoprolol to 100 mg daily by her PCP.  I also advised her about decreasing caffeine.  This also may be related to little deconditioning as she is not very physically active causing her heart rate to be elevated with activity.  Will not make any changes to her medication regimen at this time as EKG reveals normal sinus  rhythm at a normal rate.  2.  Chest tightness: This occurs with ambulation.  I have reviewed her nuclear medicine stress test which was normal with low risk for ischemia.  I believe this may be related to breathing issues versus deconditioning along with fibromyalgia.  The patient is encouraged to be more active as she can be to increase her stamina and fatigue and have less chest tightness.  3.  OSA: She is compliant with BiPAP.  She states that sometimes when she awakens in the morning her oxygen sat is less than 90%.  She is wondering about increasing her oxygen through the BiPAP.  I have advised her to follow-up with Dr. Mayford Knife with sleep medicine.  Appointment will be made for her  prior to leaving this office visit.  4.  Fibromyalgia: Severely limiting her activity.  I have suggested purposeful exercise which does not require weightbearing.  This would also help with muscle perfusion and strengthening.  She will consider a low-level type of exercise in order to help her with stamina.  5.  PAF: Remains on Eliquis and metoprolol.  EKG does not reveal any evidence of atrial fibrillation.  She is to follow-up with EP with annual visits.      Signed, Bettey Mare. Liborio Nixon, ANP, AACC

## 2023-09-25 ENCOUNTER — Encounter: Payer: Self-pay | Admitting: Adult Health

## 2023-09-25 ENCOUNTER — Ambulatory Visit: Payer: Medicare Other | Attending: Adult Health | Admitting: Adult Health

## 2023-09-25 VITALS — BP 126/68 | HR 86 | Ht 61.2 in | Wt 211.4 lb

## 2023-09-25 DIAGNOSIS — M797 Fibromyalgia: Secondary | ICD-10-CM | POA: Insufficient documentation

## 2023-09-25 DIAGNOSIS — G4733 Obstructive sleep apnea (adult) (pediatric): Secondary | ICD-10-CM | POA: Insufficient documentation

## 2023-09-25 DIAGNOSIS — R0789 Other chest pain: Secondary | ICD-10-CM | POA: Insufficient documentation

## 2023-09-25 DIAGNOSIS — I480 Paroxysmal atrial fibrillation: Secondary | ICD-10-CM | POA: Insufficient documentation

## 2023-09-25 DIAGNOSIS — I10 Essential (primary) hypertension: Secondary | ICD-10-CM | POA: Insufficient documentation

## 2023-09-25 DIAGNOSIS — R2689 Other abnormalities of gait and mobility: Secondary | ICD-10-CM | POA: Diagnosis not present

## 2023-09-25 NOTE — Progress Notes (Signed)
Cardiology Office Note:  .   Date:  09/25/2023  ID:  Kayla Bennett, DOB 1951-11-14, MRN [id number] PCP: Everrett Coombe, DO  [facility] Providers Cardiologist:    Cardiology APP:  Beatrice Lecher, PA-C  Electrophysiologist:  Will Jorja Loa, MD  Sleep Medicine:  Armanda Magic, MD    }   History of Present Illness: .   Kayla Bennett is a 72 y.o. female with history of tachycardia, fibromyalgia, PAF, HTN, Balance issues. Chronic back pain, OSA on BiPAP.    She comes today with multiple complaints. From a cardiac standpoint however she has been noticing that her heart rate has been elevated and she has been having chest tightness with walking. She was seen by her PCP and metoprolol was increased to 100 mg daily which has helped. She occasionally feels it elevated at times but better than what it had been (less than 100 bpm). She notices chest tightness when she is up and walking. She is not very active due to musculoskeletal issues to include chronic leg pain, chronic back pain, balance issues, and excessive fatigue.   She has been compliant with BiPAP although she does nap during the day as well and is often very tired. She has not seen Dr. Mayford Knife for follow-up and wonders if she should have increased on her oxygen by BiPAP. The patient states her oxygen level sometimes is less than 90 when she awakens in the morning.   ROS: As above otherwise negative.  Studies Reviewed: .      NM Stress Test 12/27/2020 The left ventricular ejection fraction is hyperdynamic (>65%). Nuclear stress EF: 74%. There was no ST segment deviation noted during stress. The study is normal. This is a low risk study.   Normal pharmacologic nuclear stress test with no evidence for prior infarct or ischemia. Normal LVEF.    EKG Interpretation Date/Time: 09/25/2023    Ventricular Rate:    PR Interval: 212 ms    QRS Duration: 72 ms   QT Interval: 382 ms    QTC Calculation: 440 ms    R  Axis: Normal axis     Text Interpretation:   Sinus rhythm with first-degree AV block.  Physical Exam:   VS:  BP 126/68   Pulse 86   Ht 5' 1.2" (1.554 m)   Wt 211 lb 6.4 oz (95.9 kg)   SpO2 98%   BMI 39.68 kg/m    Wt Readings from Last 3 Encounters:  09/25/23 211 lb 6.4 oz (95.9 kg)  09/22/23 210 lb 3.2 oz (95.3 kg)  04/08/23 204 lb (92.5 kg)    GEN: Well nourished, well developed in no acute distress NECK: No JVD; No carotid bruits CARDIAC: RRR, no murmurs, rubs, gallops RESPIRATORY:  Clear to auscultation without rales, wheezing or rhonchi  ABDOMEN: Soft, non-tender, non-distended EXTREMITIES:  No edema; No deformity   ASSESSMENT AND PLAN: .    Rapid heart rhythm: Improved with increased dose of metoprolol to 100 mg daily by her PCP.  I also advised her about decreasing caffeine.  This also may be related to little deconditioning as she is not very physically active causing her heart rate to be elevated with activity.  Will not make any changes to her medication regimen at this time as EKG reveals normal sinus rhythm at a normal rate.   2.  Chest tightness: This occurs with ambulation.  I have reviewed her nuclear medicine stress test which was normal with low risk for  ischemia.  I believe this may be related to breathing issues versus deconditioning along with fibromyalgia.  The patient is encouraged to be more active as she can be to increase her stamina and fatigue and have less chest tightness.   3.  OSA: She is compliant with BiPAP.  She states that sometimes when she awakens in the morning her oxygen sat is less than 90%.  She is wondering about increasing her oxygen through the BiPAP.  I have advised her to follow-up with Dr. Mayford Knife with sleep medicine.  Appointment will be made for her prior to leaving this office visit.   4.  Fibromyalgia: Severely limiting her activity.  I have suggested purposeful exercise which does not require weightbearing.  This would also help with  muscle perfusion and strengthening.  She will consider a low-level type of exercise in order to help her with stamina.   5.  PAF: Remains on Eliquis and metoprolol.  EKG does not reveal any evidence of atrial fibrillation.  She is to follow-up with EP with annual visits.          Signed, Bettey Mare. Liborio Nixon, ANP, AACC

## 2023-09-25 NOTE — Patient Instructions (Signed)
Medication Instructions:  No Changes *If you need a refill on your cardiac medications before your next appointment, please call your pharmacy*   Lab Work: No Labs If you have labs (blood work) drawn today and your tests are completely normal, you will receive your results only by: MyChart Message (if you have MyChart) OR A paper copy in the mail If you have any lab test that is abnormal or we need to change your treatment, we will call you to review the results.   Testing/Procedures: No Testing   Follow-Up: At [facility], you and your health needs are our priority.  As part of our continuing mission to provide you with exceptional heart care, we have created designated Provider Care Teams.  These Care Teams include your primary Cardiologist (physician) and Advanced Practice Providers (APPs -  Physician Assistants and Nurse Practitioners) who all work together to provide you with the care you need, when you need it.  We recommend signing up for the patient portal called "MyChart".  Sign up information is provided on this After Visit Summary.  MyChart is used to connect with patients for Virtual Visits (Telemedicine).  Patients are able to view lab/test results, encounter notes, upcoming appointments, etc.  Non-urgent messages can be sent to your provider as well.   To learn more about what you can do with MyChart, go to [email or web address].    Your next appointment:   First Available Appointment Sleep  Provider:   Thomasene Ripple, DO 3-4 months Armanda Magic , MD Sleep

## 2023-10-01 ENCOUNTER — Ambulatory Visit: Payer: Medicare Other | Admitting: Nurse Practitioner

## 2023-10-04 IMAGING — CT CT HEART MORPH/PULM VEIN W/ CM & W/O CA SCORE
2 of 5 series · 11 of 20 positions shown, 13 images · IV contrast (APPLIED)
Comparison: None.
COMPARISON: None.

Addendum:
EXAM:
OVER-READ INTERPRETATION  CT CHEST

The following report is an over-read performed by radiologist Dr.
Quirijn Amazigh [[email or web address]] on 12/04/2021. This
over-read does not include interpretation of cardiac or coronary
anatomy or pathology. The pulmonary venogram interpretation by the
cardiologist is attached.
CLINICAL DATA: Atrial fibrillation scheduled for ablation.
Cardiac CTA
TECHNIQUE: A non-contrast, gated CT scan was obtained with axial slices of 3 mm
through the heart for calcium scoring. Calcium scoring was performed
using the Agatston method. A 120 kV retrospective, gated, contrast
cardiac scan was obtained. Gantry rotation speed was 250 msecs and
collimation was 0.6 mm. Nitroglycerin was not given. A delayed scan
was obtained to exclude left atrial appendage thrombus. The 3D
dataset was reconstructed in 5% intervals of the 0-95% of the R-R
cycle. Late systolic phases were analyzed on a dedicated workstation
using MPR, MIP, and VRT modes. The patient received 80 cc of
contrast.

[Series 7: best syst · axial · 0.39mm/px · z∈[+1060,+1139]mm · 6 of 278 slices shown, 8 images]
[im 40/278  vessel]
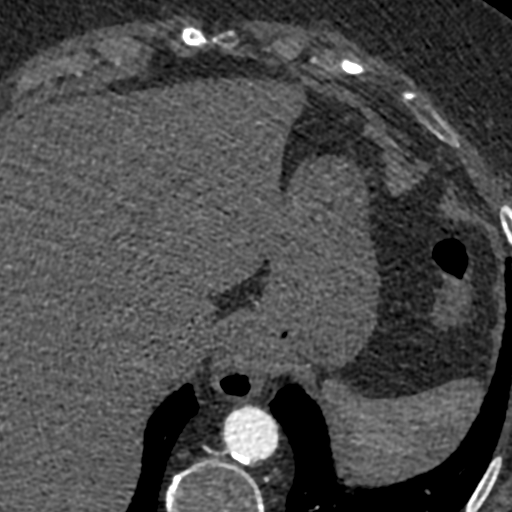
[im 40/278  lung]
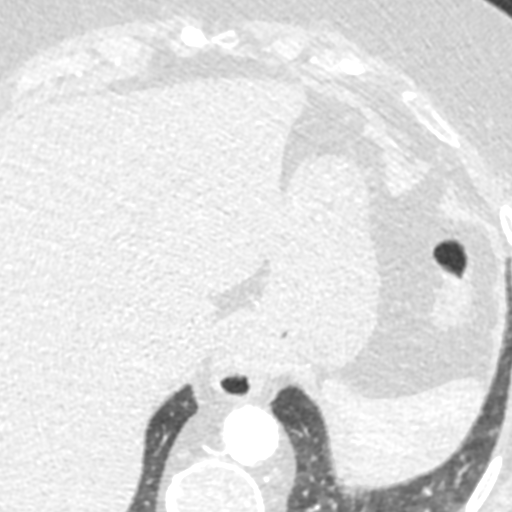
[im 80/278  vessel]
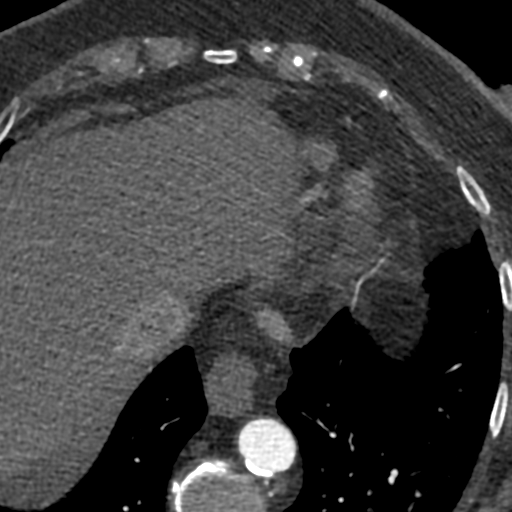
[im 119/278  vessel]
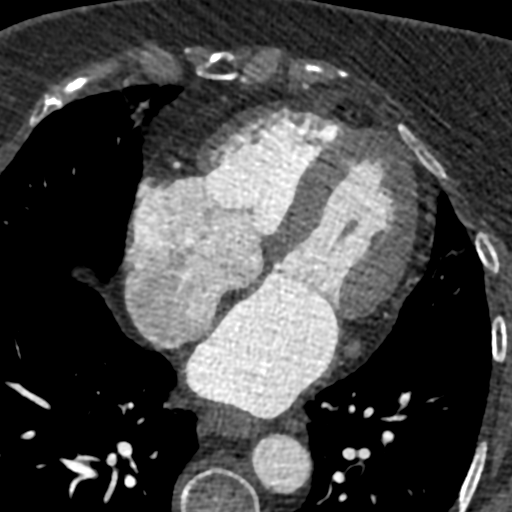
[im 159/278  vessel]
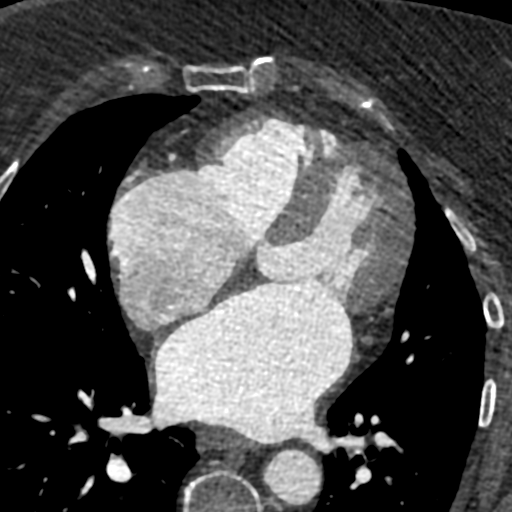
[im 198/278  vessel]
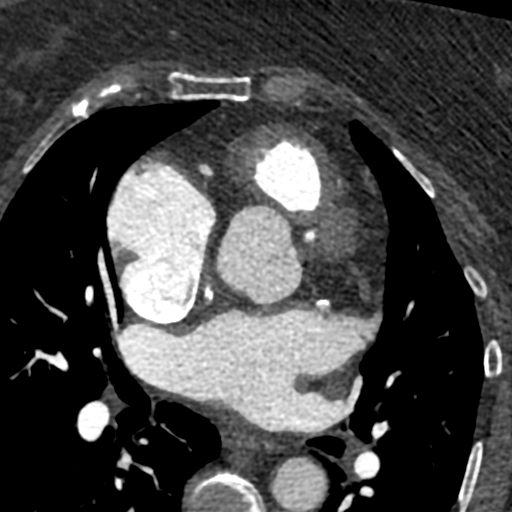
[im 198/278  lung]
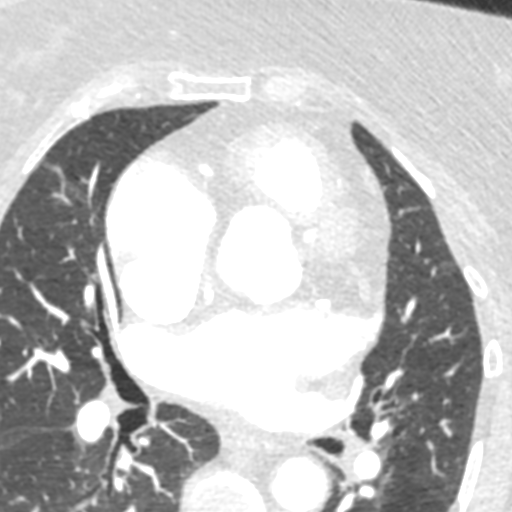
[im 238/278  vessel]
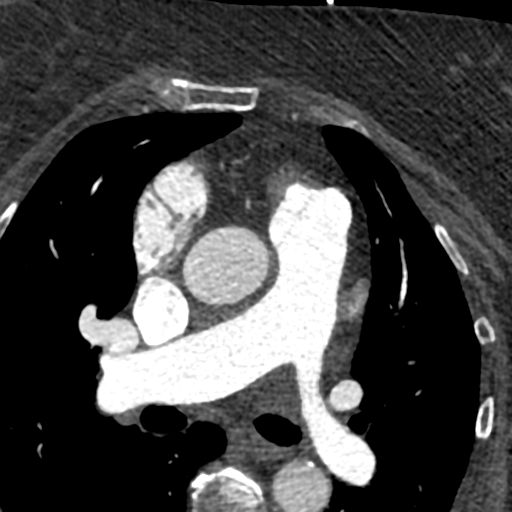

[Series 11: pv delay · axial · portal-venous · 0.39mm/px · z∈[+1083,+1157]mm · 5 of 223 slices shown]
[im 38/223  vessel]
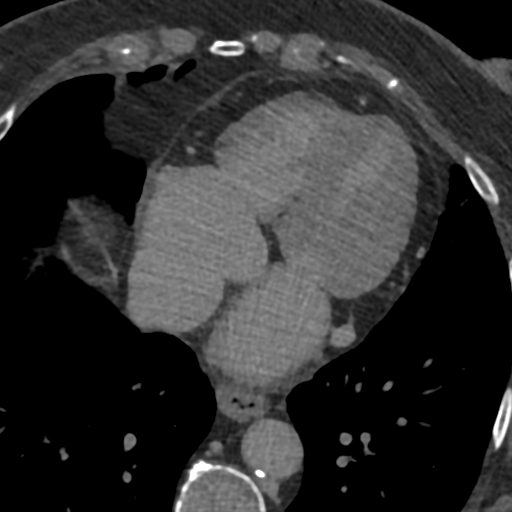
[im 75/223  vessel]
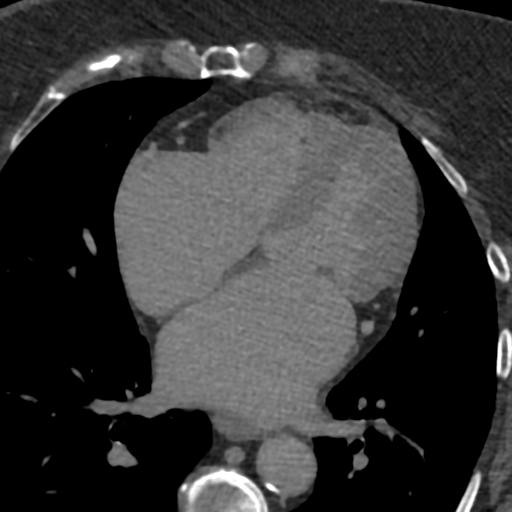
[im 112/223  vessel]
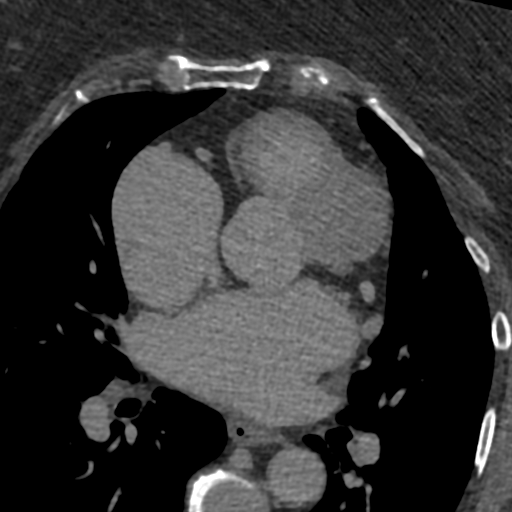
[im 149/223  vessel]
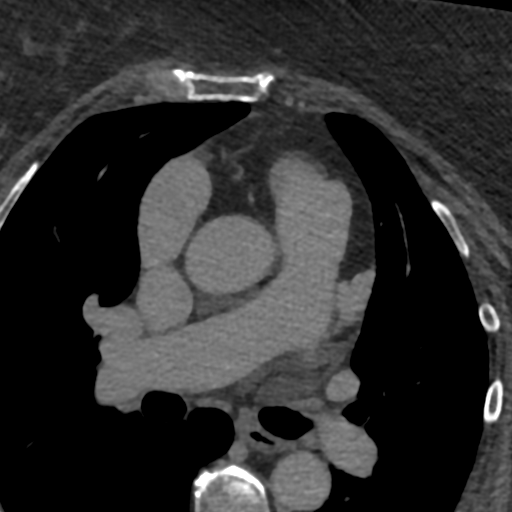
[im 186/223  vessel]
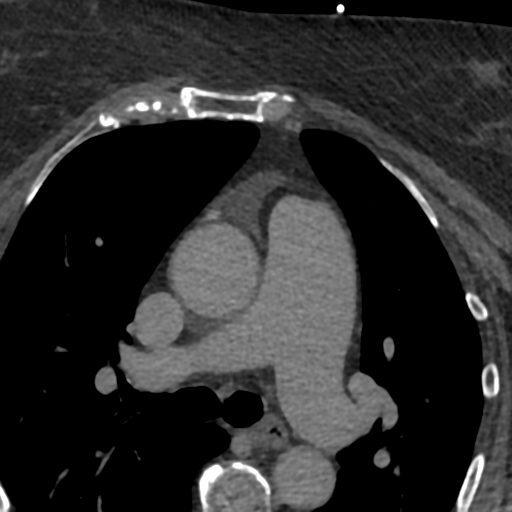

[11 of 20 positions shown; findings below may reference images not displayed]

FINDINGS: Limited view of the lung parenchyma demonstrates no suspicious
nodularity. Airways are normal.

Limited view of the mediastinum demonstrates no adenopathy.
Esophagus normal.

Limited view of the upper abdomen unremarkable.

Limited view of the skeleton and chest wall is unremarkable.
IMPRESSION: No significant extracardiac findings.
FINDINGS: Image quality: Excellent.

Noise artifact is: Limited.

Pulmonary Veins: There is normal pulmonary vein drainage into the
left atrium (2 on the right and 2 on the left) with ostial
measurements as follows:

RUPV: Ostium 17.0 mm x 15.5 mm  area 1.94 cm2

RLPV:  Ostium 13.6 mm x 10.6 mm  area 1.07 cm2

LUPV:  Ostium 17.0 mm x 16.4 mm area 2.40 cm2

LLPV:  Ostium 16.6 mm x 13.9 mm  area 1.83 cm2

Left Atrium: The left atrial size is normal. There is no PFO/ASD.
The left atrial appendage is large broccoli type with two lobes and
ostial size 21.6 mm and length 33.5 mm. There is no thrombus in the
left atrial appendage on contrast or delayed imaging. The esophagus
runs in the left atrial midline and is not in proximity to any of
the pulmonary vein ostia.

Coronary Arteries: CAC score of 511, which is 93 percentile for
age-, race-, and sex-matched controls. Normal coronary origin. Right
dominance. The study was performed without use of NTG and is
insufficient for plaque evaluation.

Right Atrium: Right atrial size is within normal limits.

Right Ventricle: The right ventricular cavity is within normal
limits.

Left Ventricle: The ventricular cavity size is within normal limits.
There are no stigmata of prior infarction. There is no abnormal
filling defect.

Pericardium: Normal thickness with no significant effusion or
calcium present.

Pulmonary Artery: Normal caliber without proximal filling defect.

Cardiac valves: The aortic valve is trileaflet without significant
calcification. The mitral valve is normal structure without
significant calcification.

Aorta: Normal caliber with no significant disease.

Extra-cardiac findings: See attached radiology report for
non-cardiac structures.
IMPRESSION: 1. There is normal pulmonary vein drainage into the left atrium with
ostial measurements above.

2. There is no thrombus in the left atrial appendage.

3. The esophagus runs in the left atrial midline and is not in
proximity to any of the pulmonary vein ostia.

4. No PFO/ASD.

5. Normal coronary origin. Right dominance.

6. CAC score of 511 which is 93 percentile for age-, race-, and
sex-matched controls.

Christophine Avong, DO

*** End of Addendum ***
EXAM:
OVER-READ INTERPRETATION  CT CHEST

The following report is an over-read performed by radiologist Dr.
Quirijn Amazigh [[email or web address]] on 12/04/2021. This
over-read does not include interpretation of cardiac or coronary
anatomy or pathology. The pulmonary venogram interpretation by the
cardiologist is attached.
FINDINGS: Limited view of the lung parenchyma demonstrates no suspicious
nodularity. Airways are normal.

Limited view of the mediastinum demonstrates no adenopathy.
Esophagus normal.

Limited view of the upper abdomen unremarkable.

Limited view of the skeleton and chest wall is unremarkable.
IMPRESSION: No significant extracardiac findings.

## 2023-10-09 ENCOUNTER — Ambulatory Visit (INDEPENDENT_AMBULATORY_CARE_PROVIDER_SITE_OTHER): Payer: Medicare Other | Admitting: Family Medicine

## 2023-10-09 ENCOUNTER — Encounter: Payer: Self-pay | Admitting: Family Medicine

## 2023-10-09 ENCOUNTER — Ambulatory Visit: Payer: Medicare Other

## 2023-10-09 VITALS — BP 130/70 | HR 71 | Ht 61.2 in | Wt 211.0 lb

## 2023-10-09 DIAGNOSIS — Z23 Encounter for immunization: Secondary | ICD-10-CM

## 2023-10-09 DIAGNOSIS — M25512 Pain in left shoulder: Secondary | ICD-10-CM | POA: Diagnosis not present

## 2023-10-09 DIAGNOSIS — E119 Type 2 diabetes mellitus without complications: Secondary | ICD-10-CM | POA: Diagnosis not present

## 2023-10-09 DIAGNOSIS — I4819 Other persistent atrial fibrillation: Secondary | ICD-10-CM | POA: Diagnosis not present

## 2023-10-09 DIAGNOSIS — I10 Essential (primary) hypertension: Secondary | ICD-10-CM

## 2023-10-09 DIAGNOSIS — D6869 Other thrombophilia: Secondary | ICD-10-CM

## 2023-10-09 DIAGNOSIS — E669 Obesity, unspecified: Secondary | ICD-10-CM | POA: Diagnosis not present

## 2023-10-09 DIAGNOSIS — M5030 Other cervical disc degeneration, unspecified cervical region: Secondary | ICD-10-CM

## 2023-10-09 DIAGNOSIS — E1169 Type 2 diabetes mellitus with other specified complication: Secondary | ICD-10-CM | POA: Diagnosis not present

## 2023-10-09 DIAGNOSIS — M542 Cervicalgia: Secondary | ICD-10-CM | POA: Diagnosis not present

## 2023-10-09 DIAGNOSIS — Z7984 Long term (current) use of oral hypoglycemic drugs: Secondary | ICD-10-CM | POA: Diagnosis not present

## 2023-10-09 LAB — POCT GLYCOSYLATED HEMOGLOBIN (HGB A1C): HbA1c, POC (controlled diabetic range): 6.7 % (ref 0.0–7.0)

## 2023-10-09 MED ORDER — PREDNISONE 20 MG PO TABS: 20.0000 mg | ORAL_TABLET | Freq: Two times a day (BID) | ORAL | 0 refills | Status: AC

## 2023-10-09 MED ORDER — ALBUTEROL SULFATE HFA 108 (90 BASE) MCG/ACT IN AERS: 2.0000 | INHALATION_SPRAY | Freq: Four times a day (QID) | RESPIRATORY_TRACT | 0 refills | Status: AC | PRN

## 2023-10-09 NOTE — Assessment & Plan Note (Signed)
She remains on eliquis and is tolerating well

## 2023-10-09 NOTE — Assessment & Plan Note (Signed)
Xrays of cervical spine ordered.  Short burst of prednisone added.

## 2023-10-09 NOTE — Assessment & Plan Note (Signed)
Rate is better controlled with increase in metoprolol.  BP remains stable.  She will continue to see cardiology.  Remains anticoagulated with eliquis.

## 2023-10-09 NOTE — Addendum Note (Signed)
Addended by: Ardyth Man on: 10/09/2023 09:16 AM   Modules accepted: Orders

## 2023-10-09 NOTE — Assessment & Plan Note (Signed)
BP is well controlled.  Continue losartan/hctz at current strength.

## 2023-10-09 NOTE — Patient Instructions (Signed)
Try prednisone for neck pain.  Have xrays completed.

## 2023-10-09 NOTE — Assessment & Plan Note (Signed)
Diabetes remains very well-controlled.  She will continue on metformin.  Dietary changes encouraged.

## 2023-10-09 NOTE — Progress Notes (Signed)
Kayla Bennett - 72 y.o. female MRN [id number]  Date of birth: 03-26-1951  Subjective Chief Complaint  Patient presents with   Diabetes   Leg Pain   Shortness of Breath    HPI Kayla Bennett is a 72 y.o. female here today for follow up.   She reports that she is doing pretty well  She has continued to see cardiology for management of her HTN and A. Fib.  She remains on losartan/hydrochlorothiazide and metoprolol.  BP has been well controlled.  She remains anticoagulated with Eliquis.   She is using BiPAP which cardiology is also managing.  Tolerating pretty well.  She has had some dyspnea with exertion.  Cardiology didn't really feel that this was cardiac related.  She does have upcoming visit to see if BiPap needs adjustment.   Her diabetes is managed with meformin.  Toleraitng this well at current strength.  A1c today is 6.7%.   She is having some neck pain with radiation to L shoulder and arm.  This is worse with sitting at her computer.  ROS:  A comprehensive ROS was completed and negative except as noted per HPI  Allergies  Allergen Reactions   Lotensin [Benazepril Hcl] Swelling   Morphine Itching and Rash   Erythromycin Base Other (See Comments)    Unknown reaction   Hydrocodone Other (See Comments)    Unknown reaction     Other Swelling    Mustard - vaginal swelling/pain    Banana Swelling    vaginal swelling/pain   Chocolate Swelling    vaginal swelling/pain   Gluten Meal Diarrhea and Other (See Comments)    Upset stomach    Tizanidine Other (See Comments)    Lowered blood pressure    Wound Dressing Adhesive Other (See Comments)    Skin burns, blisters     Past Medical History:  Diagnosis Date   Allergy 1978   Food allergies, seasonal allergies, medicine allergies   Anemia    Arthritis    Asthma    Blood transfusion without reported diagnosis 1985   Breast cancer (HCC)    Diabetes (HCC)    Dyspnea    Dysrhythmia    afibb on occasion   Family  history of adverse reaction to anesthesia    mother had reaction to versed...during surgery, stopped breathing   Fibromyalgia    GERD (gastroesophageal reflux disease)    Headache    History of hiatal hernia    Hyperlipidemia 1990?   Hypertension    Migraines    Osteopenia    Osteoporosis    Oxygen deficiency 2023   Psoriasis    Sleep apnea 2023   Yeast infection 10/30/2021    Past Surgical History:  Procedure Laterality Date   ABDOMINAL HYSTERECTOMY     APPENDECTOMY     ATRIAL FIBRILLATION ABLATION N/A 12/10/2021   Procedure: ATRIAL FIBRILLATION ABLATION;  Surgeon: Regan Lemming, MD;  Location: [facility] INVASIVE CV LAB;  Service: Cardiovascular;  Laterality: N/A;   BREAST LUMPECTOMY WITH RADIOACTIVE SEED AND SENTINEL LYMPH NODE BIOPSY Right 02/18/2021   Procedure: RIGHT BREAST LUMPECTOMY WITH RADIOACTIVE SEED AND SENTINEL LYMPH NODE BIOPSY;  Surgeon: Almond Lint, MD;  Location: [facility] OR;  Service: General;  Laterality: Right;   BREAST SURGERY  2022   broken wrist     CARDIOVERSION N/A 04/04/2021   Procedure: CARDIOVERSION;  Surgeon: Quintella Reichert, MD;  Location: [facility] ENDOSCOPY;  Service: Cardiovascular;  Laterality: N/A;   CARDIOVERSION N/A 08/14/2021  Procedure: CARDIOVERSION;  Surgeon: Sande Rives, MD;  Location: [facility] ENDOSCOPY;  Service: Cardiovascular;  Laterality: N/A;   CARDIOVERSION N/A 09/19/2021   Procedure: CARDIOVERSION;  Surgeon: Sande Rives, MD;  Location: [facility] ENDOSCOPY;  Service: Cardiovascular;  Laterality: N/A;   CESAREAN SECTION  1976 and 1977   CHOLECYSTECTOMY     FRACTURE SURGERY     right wrist   HYSTERECTOMY ABDOMINAL WITH SALPINGO-OOPHORECTOMY     LYSIS OF ADHESION     SHOULDER SURGERY Left    TUBAL LIGATION      Social History   Socioeconomic History   Marital status: Married    Spouse name: Not on file   Number of children: 2   Years of education: Not on file   Highest education level: Some college, no degree  Occupational  History   Not on file  Tobacco Use   Smoking status: Never   Smokeless tobacco: Never  Vaping Use   Vaping status: Never Used  Substance and Sexual Activity   Alcohol use: Never   Drug use: Never   Sexual activity: Not Currently  Other Topics Concern   Not on file  Social History Narrative   Not on file   Social Determinants of Health   Financial Resource Strain: Low Risk  (10/07/2023)   Overall Financial Resource Strain (CARDIA)    Difficulty of Paying Living Expenses: Not hard at all  Food Insecurity: No Food Insecurity (10/07/2023)   Hunger Vital Sign    Worried About Running Out of Food in the Last Year: Never true    Ran Out of Food in the Last Year: Never true  Transportation Needs: No Transportation Needs (10/07/2023)   PRAPARE - Administrator, Civil Service (Medical): No    Lack of Transportation (Non-Medical): No  Physical Activity: Unknown (10/07/2023)   Exercise Vital Sign    Days of Exercise per Week: 0 days    Minutes of Exercise per Session: Not on file  Stress: No Stress Concern Present (10/07/2023)   [employer] of Occupational Health - Occupational Stress Questionnaire    Feeling of Stress : Not at all  Social Connections: Unknown (10/07/2023)   Social Connection and Isolation Panel [NHANES]    Frequency of Communication with Friends and Family: Twice a week    Frequency of Social Gatherings with Friends and Family: Patient declined    Attends Religious Services: Patient declined    Database administrator or Organizations: No    Attends Engineer, structural: Not on file    Marital Status: Married    Family History  Problem Relation Age of Onset   Atrial fibrillation Mother    Heart attack Mother 53   Arthritis Mother    Cancer Mother    Diabetes Mother    Heart disease Mother    Hypertension Mother    Colon cancer Father 76   Heart attack Father 69   Cancer Father    Early death Father    Heart disease Father     Hypertension Father    Heart attack Paternal Grandmother 70   Cancer Maternal Uncle        melanoma    Colon cancer Paternal Uncle    Cancer Paternal Uncle    Diabetes Paternal Uncle    Heart disease Paternal Uncle    Obesity Paternal Uncle    Colon cancer Cousin    Atrial fibrillation Cousin    Stroke Maternal Grandmother  Stroke Paternal Grandfather    Arthritis Maternal Aunt    Diabetes Maternal Aunt    Hearing loss Maternal Aunt    Cancer Paternal Uncle    Heart disease Paternal Uncle    Obesity Paternal Uncle    Diabetes Maternal Aunt    Diabetes Maternal Aunt    Heart disease Maternal Aunt    Diabetes Maternal Uncle    Hearing loss Maternal Uncle    Diabetes Paternal Aunt    Hearing loss Paternal Aunt    Diabetes Sister    Hyperlipidemia Sister    Hypertension Sister    Diabetes Paternal Uncle    Hearing loss Paternal Uncle    Diabetes Paternal Uncle    Hearing loss Paternal Uncle    Heart disease Paternal Uncle    Hypertension Paternal Uncle    Diabetes Paternal Uncle    Stroke Maternal Uncle     Health Maintenance  Topic Date Due   Hepatitis C Screening  Never done   Pneumonia Vaccine 50+ Years old (1 of 1 - PCV) Never done   Medicare Annual Wellness (AWV)  08/14/2021   OPHTHALMOLOGY EXAM  06/06/2023   INFLUENZA VACCINE  06/18/2023   Zoster Vaccines- Shingrix (1 of 2) 01/09/2024 (Originally [birth year]/17/1970)   COVID-19 Vaccine (3 - 2023-24 season) 01/20/2024 (Originally 07/19/2023)   MAMMOGRAM  11/04/2023   Diabetic kidney evaluation - Urine ACR  04/07/2024   HEMOGLOBIN A1C  04/07/2024   DTaP/Tdap/Td (2 - Td or Tdap) 05/31/2024   Diabetic kidney evaluation - eGFR measurement  09/21/2024   FOOT EXAM  10/08/2024   Colonoscopy  04/19/2029   DEXA SCAN  Completed   HPV VACCINES  Aged Out      ----------------------------------------------------------------------------------------------------------------------------------------------------------------------------------------------------------------- Physical Exam BP 130/70 (BP Location: Left Arm, Patient Position: Sitting, Cuff Size: Large)   Pulse 71   Ht 5' 1.2" (1.554 m)   Wt 211 lb (95.7 kg)   SpO2 94%   BMI 39.61 kg/m   Physical Exam Constitutional:      Appearance: Normal appearance.  HENT:     Head: Normocephalic and atraumatic.  Eyes:     General: No scleral icterus. Cardiovascular:     Rate and Rhythm: Normal rate.  Pulmonary:     Effort: Pulmonary effort is normal.     Breath sounds: Normal breath sounds.  Musculoskeletal:     Cervical back: Neck supple.  Neurological:     General: No focal deficit present.     Mental Status: She is alert.  Psychiatric:        Mood and Affect: Mood normal.        Behavior: Behavior normal.     ------------------------------------------------------------------------------------------------------------------------------------------------------------------------------------------------------------------- Assessment and Plan  Persistent atrial fibrillation (HCC) Rate is better controlled with increase in metoprolol.  BP remains stable.  She will continue to see cardiology.  Remains anticoagulated with eliquis.   Secondary hypercoagulable state (HCC) She remains on eliquis and is tolerating well  Type 2 diabetes mellitus with obesity (HCC) Diabetes remains very well-controlled.  She will continue on metformin.  Dietary changes encouraged.  Hypertension BP is well controlled.  Continue losartan/hctz at current strength.   DDD (degenerative disc disease), cervical Xrays of cervical spine ordered.  Short burst of prednisone added.    Meds ordered this encounter  Medications   predniSONE (DELTASONE) 20 MG tablet    Sig: Take 1 tablet (20 mg total) by mouth 2  (two) times daily with a meal for 5 days.    Dispense:  10 tablet    Refill:  0   albuterol (VENTOLIN HFA) 108 (90 Base) MCG/ACT inhaler    Sig: Inhale 2 puffs into the lungs every 6 (six) hours as needed for wheezing or shortness of breath.    Dispense:  8 g    Refill:  0    Return in about 6 months (around 04/07/2024) for Hypertension, Type 2 Diabetes.    This visit occurred during the SARS-CoV-2 public health emergency.  Safety protocols were in place, including screening questions prior to the visit, additional usage of staff PPE, and extensive cleaning of exam room while observing appropriate contact time as indicated for disinfecting solutions.

## 2023-10-27 ENCOUNTER — Other Ambulatory Visit: Payer: Self-pay | Admitting: Cardiology

## 2023-10-28 NOTE — Telephone Encounter (Signed)
Prescription refill request for Eliquis received. Indication: AF Last office visit: 09/25/23  Harriet Pho NP Scr: 0.80 on 09/22/23  Epic Age: 72 Weight: 95.9kg  Based on above findings Eliquis 5mg  twice daily is the appropriate dose.  Refill approved.

## 2023-10-30 ENCOUNTER — Other Ambulatory Visit: Payer: Self-pay | Admitting: Cardiology

## 2023-11-05 ENCOUNTER — Other Ambulatory Visit: Payer: Self-pay | Admitting: Family Medicine

## 2023-11-05 DIAGNOSIS — Z1231 Encounter for screening mammogram for malignant neoplasm of breast: Secondary | ICD-10-CM | POA: Diagnosis not present

## 2023-11-07 ENCOUNTER — Other Ambulatory Visit: Payer: Self-pay | Admitting: Family Medicine

## 2023-11-19 ENCOUNTER — Other Ambulatory Visit: Payer: Self-pay | Admitting: Family Medicine

## 2023-11-24 ENCOUNTER — Encounter: Payer: Medicare Other | Admitting: Family Medicine

## 2023-12-01 ENCOUNTER — Ambulatory Visit (INDEPENDENT_AMBULATORY_CARE_PROVIDER_SITE_OTHER): Payer: Medicare Other | Admitting: Family Medicine

## 2023-12-01 ENCOUNTER — Encounter: Payer: Self-pay | Admitting: Family Medicine

## 2023-12-01 VITALS — Ht 61.5 in | Wt 205.0 lb

## 2023-12-01 DIAGNOSIS — Z0000 Encounter for general adult medical examination without abnormal findings: Secondary | ICD-10-CM

## 2023-12-01 NOTE — Progress Notes (Signed)
 Subjective:   Kayla Bennett is a 73 y.o. female who presents for Medicare Annual (Subsequent) preventive examination.  Visit Complete: Virtual I connected with  Kayla Bennett on 12/01/23 by a audio enabled telemedicine application and verified that I am speaking with the correct person using two identifiers.  Patient Location: Home  Provider Location: Office/Clinic  I discussed the limitations of evaluation and management by telemedicine. The patient expressed understanding and agreed to proceed.  Vital Signs: Because this visit was a virtual/telehealth visit, some criteria may be missing or patient reported. Any vitals not documented were not able to be obtained and vitals that have been documented are patient reported.  Patient Medicare AWV questionnaire was completed by the patient on n/a ; I have confirmed that all information answered by patient is correct and no changes since this date.        Objective:    Today's Vitals   12/01/23 1306  Weight: 205 lb (93 kg)  Height: 5' 1.5 (1.562 m)   Body mass index is 38.11 kg/m.     12/01/2023    1:21 PM 08/27/2022    9:54 AM 08/21/2022    6:56 PM 06/25/2022    1:09 PM 01/29/2022    8:45 PM 10/25/2021   12:52 PM 10/17/2021    2:27 PM  Advanced Directives  Does Patient Have a Medical Advance Directive? Yes No No Yes Yes Yes Yes  Type of Clinical [employer] Associate [employer] Power of [place];Living will Healthcare Power of [place];Living will Healthcare Power of [place];Living will  Does patient want to make changes to medical advance directive? No - Patient declined   No - Patient declined No - Patient declined No - Patient declined   Copy of Healthcare Power of Attorney in Chart?     No - copy requested No - copy requested No - copy requested  Would patient like information on creating a medical advance directive?  No - Patient declined No - Patient declined         Current Medications (verified) Outpatient Encounter Medications as of 12/01/2023  Medication Sig   acetaminophen  (TYLENOL ) 500 MG tablet Take 500 mg by mouth every 6 (six) hours as needed for moderate pain, headache or mild pain.   albuterol  (VENTOLIN  HFA) 108 (90 Base) MCG/ACT inhaler Inhale 2 puffs into the lungs every 6 (six) hours as needed for wheezing or shortness of breath.   apixaban  (ELIQUIS ) 5 MG TABS tablet TAKE ONE TABLET BY MOUTH TWICE DAILY   Calcium  Carbonate (CALCIUM  600 PO) Take by mouth.   cetirizine (ZYRTEC) 10 MG tablet Take 10 mg by mouth in the morning.   cholecalciferol  (VITAMIN D3) 25 MCG (1000 UNIT) tablet Take 1,000-2,000 Units by mouth See admin instructions. Take 1000 units in the morning and 2000 units at bedtime   ferrous sulfate  325 (65 FE) MG tablet Take 325 mg by mouth every morning.   gabapentin  (NEURONTIN ) 100 MG capsule TAKE THREE CAPSULES BY MOUTH AT BEDTIME   hydrocortisone 2.5 % cream Apply 1 Application topically 2 (two) times daily.   ibuprofen  (ADVIL ) 200 MG tablet Take 200 mg by mouth every 6 (six) hours as needed for mild pain (pain score 1-3).   losartan -hydrochlorothiazide  (HYZAAR ) 100-25 MG tablet Take 1 tablet by mouth daily.   metFORMIN  (GLUCOPHAGE -XR) 500 MG 24 hr tablet TAKE ONE TABLET BY MOUTH EVERY DAY   metoprolol  succinate (TOPROL -XL) 100 MG 24 hr tablet  Take 1 tablet (100 mg total) by mouth daily. Take with or immediately following a meal.   pantoprazole  (PROTONIX ) 40 MG tablet TAKE ONE TABLET BY MOUTH TWICE DAILY   potassium chloride  SA (KLOR-CON  M) 20 MEQ tablet Take 1 tablet (20 mEq total) by mouth daily.   Prenatal Vit-Fe Fumarate-FA (PRENATAL VITAMIN PLUS LOW IRON PO) Take 1 tablet by mouth in the morning.   tamoxifen  (NOLVADEX ) 20 MG tablet Take 1 tablet (20 mg total) by mouth every morning.   venlafaxine  XR (EFFEXOR -XR) 37.5 MG 24 hr capsule Take 1 capsule (37.5 mg total) by mouth at bedtime.   venlafaxine  XR (EFFEXOR -XR) 75 MG  24 hr capsule TAKE ONE CAPSULE BY MOUTH EVERY DAY   vitamin C (ASCORBIC ACID) 500 MG tablet Take 500 mg by mouth daily.   calcipotriene (DOVONOX) 0.005 % cream Apply 1 application topically See admin instructions. Mix with fluorouracil 5% cream and apply topically twice daily for five days - as needed for precancerous on right cheek. (Patient not taking: Reported on 12/01/2023)   fluorouracil (EFUDEX) 5 % cream Apply 1 application  topically 2 (two) times daily as needed (precancerous spots on right temple and left eyebrow). Mix with calcipotriene 0.005% cream and apply topically twice daily for five days - as needed for precancerous on right temple and left eyebrow (Patient not taking: Reported on 12/01/2023)   triamcinolone (KENALOG) 0.1 % Apply 1 application  topically 2 (two) times daily as needed (psoriasis). Mixed with Cetaphil 1:1 (Patient not taking: Reported on 12/01/2023)   UNABLE TO FIND OUTPATIENT PHYSICAL THERAPY    Diagnosis: Generalized debility due to UTI, acute gastroenteritis (Patient not taking: Reported on 12/01/2023)   No facility-administered encounter medications on file as of 12/01/2023.    Allergies (verified) Lotensin [benazepril hcl], Morphine, Erythromycin base, Hydrocodone , Other, Banana, Chocolate, Gluten meal, Tizanidine, and Wound dressing adhesive   History: Past Medical History:  Diagnosis Date   Allergy 1978   Food allergies, seasonal allergies, medicine allergies   Anemia    Arthritis    Asthma    Blood transfusion without reported diagnosis 1985   Breast cancer (HCC)    Diabetes (HCC)    Dyspnea    Dysrhythmia    afibb on occasion   Family history of adverse reaction to anesthesia    mother had reaction to versed ...during surgery, stopped breathing   Fibromyalgia    GERD (gastroesophageal reflux disease)    Headache    History of hiatal hernia    Hyperlipidemia 1990?   Hypertension    Migraines    Osteopenia    Osteoporosis    Oxygen  deficiency  2023   Psoriasis    Sleep apnea 2023   Yeast infection 10/30/2021   Past Surgical History:  Procedure Laterality Date   ABDOMINAL HYSTERECTOMY     APPENDECTOMY     ATRIAL FIBRILLATION ABLATION N/A 12/10/2021   Procedure: ATRIAL FIBRILLATION ABLATION;  Surgeon: Inocencio Soyla Lunger, MD;  Location: [facility];  Service: Cardiovascular;  Laterality: N/A;   BREAST LUMPECTOMY WITH RADIOACTIVE SEED AND SENTINEL LYMPH NODE BIOPSY Right 02/18/2021   Procedure: RIGHT BREAST LUMPECTOMY WITH RADIOACTIVE SEED AND SENTINEL LYMPH NODE BIOPSY;  Surgeon: Aron Shoulders, MD;  Location: [facility] OR;  Service: General;  Laterality: Right;   BREAST SURGERY  2022   broken wrist     CARDIOVERSION N/A 04/04/2021   Procedure: CARDIOVERSION;  Surgeon: Shlomo Wilbert SAUNDERS, MD;  Location: [facility] ENDOSCOPY;  Service: Cardiovascular;  Laterality: N/A;  CARDIOVERSION N/A 08/14/2021   Procedure: CARDIOVERSION;  Surgeon: Barbaraann Darryle Ned, MD;  Location: [facility] ENDOSCOPY;  Service: Cardiovascular;  Laterality: N/A;   CARDIOVERSION N/A 09/19/2021   Procedure: CARDIOVERSION;  Surgeon: Barbaraann Darryle Ned, MD;  Location: [facility] ENDOSCOPY;  Service: Cardiovascular;  Laterality: N/A;   CESAREAN SECTION  1976 and 1977   CHOLECYSTECTOMY     FRACTURE SURGERY     right wrist   HYSTERECTOMY ABDOMINAL WITH SALPINGO-OOPHORECTOMY     LYSIS OF ADHESION     SHOULDER SURGERY Left    TUBAL LIGATION     Family History  Problem Relation Age of Onset   Atrial fibrillation Mother    Heart attack Mother 72   Arthritis Mother    Cancer Mother    Diabetes Mother    Heart disease Mother    Hypertension Mother    Colon cancer Father 58   Heart attack Father 20   Cancer Father    Early death Father    Heart disease Father    Hypertension Father    Heart attack Paternal Grandmother 53   Cancer Maternal Uncle        melanoma    Colon cancer Paternal Uncle    Cancer Paternal Uncle    Diabetes Paternal Uncle    Heart disease Paternal  Uncle    Obesity Paternal Uncle    Colon cancer Cousin    Atrial fibrillation Cousin    Stroke Maternal Grandmother    Stroke Paternal Grandfather    Arthritis Maternal Aunt    Diabetes Maternal Aunt    Hearing loss Maternal Aunt    Cancer Paternal Uncle    Heart disease Paternal Uncle    Obesity Paternal Uncle    Diabetes Maternal Aunt    Diabetes Maternal Aunt    Heart disease Maternal Aunt    Diabetes Maternal Uncle    Hearing loss Maternal Uncle    Diabetes Paternal Aunt    Hearing loss Paternal Aunt    Diabetes Sister    Hyperlipidemia Sister    Hypertension Sister    Diabetes Paternal Uncle    Hearing loss Paternal Uncle    Diabetes Paternal Uncle    Hearing loss Paternal Uncle    Heart disease Paternal Uncle    Hypertension Paternal Uncle    Diabetes Paternal Uncle    Stroke Maternal Uncle    Social History   Socioeconomic History   Marital status: Married    Spouse name: Not on file   Number of children: 2   Years of education: Not on file   Highest education level: Some college, no degree  Occupational History   Not on file  Tobacco Use   Smoking status: Never   Smokeless tobacco: Never  Vaping Use   Vaping status: Never Used  Substance and Sexual Activity   Alcohol use: Never   Drug use: Never   Sexual activity: Not Currently  Other Topics Concern   Not on file  Social History Narrative   Not on file   Social Drivers of Health   Financial Resource Strain: Low Risk  (12/01/2023)   Overall Financial Resource Strain (CARDIA)    Difficulty of Paying Living Expenses: Not very hard  Food Insecurity: No Food Insecurity (12/01/2023)   Hunger Vital Sign    Worried About Running Out of Food in the Last Year: Never true    Ran Out of Food in the Last Year: Never true  Transportation Needs: No Transportation Needs (12/01/2023)  PRAPARE - Administrator, Civil Service (Medical): No    Lack of Transportation (Non-Medical): No  Physical  Activity: Inactive (12/01/2023)   Exercise Vital Sign    Days of Exercise per Week: 0 days    Minutes of Exercise per Session: 0 min  Stress: No Stress Concern Present (12/01/2023)   [employer] of Occupational Health - Occupational Stress Questionnaire    Feeling of Stress : Only a little  Social Connections: Moderately Isolated (12/01/2023)   Social Connection and Isolation Panel [NHANES]    Frequency of Communication with Friends and Family: Three times a week    Frequency of Social Gatherings with Friends and Family: Once a week    Attends Religious Services: Never    Database Administrator or Organizations: No    Attends Engineer, Structural: Never    Marital Status: Married    Tobacco Counseling Counseling given: Not Answered   Clinical Intake:  Pre-visit preparation completed: No  Pain : No/denies pain     BMI - recorded: 38.1 Nutritional Status: BMI > 30  Obese Nutritional Risks: None Diabetes: Yes CBG done?: No (does not check at home) Did pt. bring in CBG monitor from home?: No  How often do you need to have someone help you when you read instructions, pamphlets, or other written materials from your doctor or pharmacy?: 1 - Never What is the last grade level you completed in school?: 13  Interpreter Needed?: No      Activities of Daily Living    12/01/2023    1:09 PM  In your present state of health, do you have any difficulty performing the following activities:  Hearing? 0  Vision? 0  Comment needs updated prescription  Difficulty concentrating or making decisions? 0  Walking or climbing stairs? 1  Comment balance problem  Dressing or bathing? 0  Doing errands, shopping? 0  Preparing Food and eating ? N  Using the Toilet? N  In the past six months, have you accidently leaked urine? N  Do you have problems with loss of bowel control? N  Managing your Medications? N  Managing your Finances? N  Housekeeping or managing your  Housekeeping? N    Patient Care Team: Alvia Bring, DO as PCP - General (Family Medicine) Sheena Pugh  MD  as PCP - Cardiology (Cardiology) Shlomo Wilbert SAUNDERS, MD as PCP - Sleep Medicine (Cardiology) Lanny Callander, MD as Consulting Physician (Hematology) Izell Domino, MD as Attending Physician (Radiation Oncology) Jewell, Jolene, MD, Derm      Indicate any recent Medical Services you may have received from other than [facility] providers in the past year (date may be approximate).     Assessment:   This is a routine wellness examination for Kayla Bennett.  Hearing/Vision screen Hearing Screening - Comments:: Unable to test, grossly intact.  Vision Screening - Comments:: Unable to test, grossly intact.    Goals Addressed             This Visit's Progress    Exercise 3x per week (30 min per time)        Depression Screen    12/01/2023    1:21 PM 10/09/2023    8:38 AM 04/08/2023    8:21 AM 06/25/2022    1:10 PM 12/09/2021    1:44 PM 11/14/2021   10:06 AM 10/30/2021    8:54 AM  PHQ 2/9 Scores  PHQ - 2 Score 0 0 0 0 0 0 0  PHQ-  9 Score    2       Fall Risk    12/01/2023    1:22 PM 10/09/2023    8:38 AM 04/08/2023    8:20 AM 06/25/2022   10:28 AM 12/09/2021    1:44 PM  Fall Risk   Falls in the past year? 0 0 0 0 0  Number falls in past yr: 0 0 0 0 0  Injury with Fall? 0 0 0 0 0  Risk for fall due to : Other (Comment) No Fall Risks No Fall Risks No Fall Risks   Risk for fall due to: Comment only when she doesn't feel well.      Follow up  Falls evaluation completed Falls evaluation completed Falls evaluation completed     MEDICARE RISK AT HOME: Medicare Risk at Home Any stairs in or around the home?: No If so, are there any without handrails?: No Home free of loose throw rugs in walkways, pet beds, electrical cords, etc?: No (has loose rugs.) Adequate lighting in your home to reduce risk of falls?: Yes Life alert?: No Use of a cane, walker or w/c?: No Grab bars in the  bathroom?: Yes Shower chair or bench in shower?: Yes Elevated toilet seat or a handicapped toilet?: Yes  TIMED UP AND GO:  Was the test performed?  No    Cognitive Function:        12/01/2023    1:25 PM  6CIT Screen  What Year? 0 points  What month? 0 points  What time? 0 points  Count back from 20 0 points  Months in reverse 0 points  Repeat phrase 0 points  Total Score 0 points    Immunizations Immunization History  Administered Date(s) Administered   Fluad Quad(high Dose 65+) 10/08/2022   Fluad Trivalent(High Dose 65+) 10/09/2023   Influenza-Unspecified 08/17/2018   Janssen (J&J) SARS-COV-2 Vaccination 01/28/2020   Moderna Sars-Covid-2 Vaccination 11/03/2020   PNEUMOCOCCAL CONJUGATE-20 10/09/2023   PPD Test 03/11/2016   Tdap 05/31/2014    TDAP status: Up to date  Flu Vaccine status: Up to date  Pneumococcal vaccine status: Up to date  Covid-19 vaccine status: Declined, Education has been provided regarding the importance of this vaccine but patient still declined. Advised may receive this vaccine at local pharmacy or Health Dept.or vaccine clinic. Aware to provide a copy of the vaccination record if obtained from local pharmacy or Health Dept. Verbalized acceptance and understanding.  Qualifies for Shingles Vaccine? Yes   Zostavax completed No   Shingrix Completed?: No.    Education has been provided regarding the importance of this vaccine. Patient has been advised to call insurance company to determine out of pocket expense if they have not yet received this vaccine. Advised may also receive vaccine at local pharmacy or Health Dept. Verbalized acceptance and understanding.  Screening Tests Health Maintenance  Topic Date Due   Hepatitis C Screening  Never done   OPHTHALMOLOGY EXAM  06/06/2023   MAMMOGRAM  11/04/2023   Zoster Vaccines- Shingrix (1 of 2) 01/09/2024 (Originally 02/17/1970)   COVID-19 Vaccine (3 - 2024-25 season) 01/20/2024 (Originally 07/19/2023)    Diabetic kidney evaluation - Urine ACR  04/07/2024   HEMOGLOBIN A1C  04/07/2024   DTaP/Tdap/Td (2 - Td or Tdap) 05/31/2024   Diabetic kidney evaluation - eGFR measurement  09/21/2024   FOOT EXAM  10/08/2024   Medicare Annual Wellness (AWV)  11/30/2024   Colonoscopy  04/19/2029   Pneumonia Vaccine 31+ Years old  Completed  INFLUENZA VACCINE  Completed   DEXA SCAN  Completed   HPV VACCINES  Aged Out    Health Maintenance  Health Maintenance Due  Topic Date Due   Hepatitis C Screening  Never done   OPHTHALMOLOGY EXAM  06/06/2023   MAMMOGRAM  11/04/2023    Colorectal cancer screening: Type of screening: Colonoscopy. Completed 04/20/2019. Repeat every 10 years  Mammogram status: Completed 11/04/2022. Repeat every year  Bone Density status: Completed 08/21/2021. Results reflect: Bone density results: OSTEOPENIA. Repeat every 2-3 years.  Lung Cancer Screening: (Low Dose CT Chest recommended if Age 56-80 years, 20 pack-year currently smoking OR have quit w/in 15years.) does not qualify.   Lung Cancer Screening Referral: n/a   Additional Screening:  Hepatitis C Screening: does qualify; Completed n/a   Vision Screening: Recommended annual ophthalmology exams for early detection of glaucoma and other disorders of the eye. Is the patient up to date with their annual eye exam?  No  Who is the provider or what is the name of the office in which the patient attends annual eye exams? Dr. Oneita [facility] [place], [place]  If pt is not established with a provider, would they like to be referred to a provider to establish care? No .   Dental Screening: Recommended annual dental exams for proper oral hygiene  Diabetic Foot Exam: Diabetic Foot Exam: Completed 10/09/23  Community Resource Referral / Chronic Care Management: CRR required this visit?  No   CCM required this visit?  No     Plan:     I have personally reviewed and noted the following in the patient's  chart:   Medical and social history Use of alcohol, tobacco or illicit drugs  Current medications and supplements including opioid prescriptions. Patient is not currently taking opioid prescriptions. Functional ability and status Nutritional status Physical activity Advanced directives List of other physicians Hospitalizations, surgeries, and ER visits in previous 12 months: no  Vitals Screenings to include cognitive, depression, and falls Referrals and appointments: none needed today.   In addition, I have reviewed and discussed with patient certain preventive protocols, quality metrics, and best practice recommendations. A written personalized care plan for preventive services as well as general preventive health recommendations were provided to patient.     Darice JONELLE Brownie, FNP   12/01/2023   After Visit Summary: (MyChart) Due to this being a telephonic visit, the after visit summary with patients personalized plan was offered to patient via MyChart    Follow-up with PCP as scheduled. Discuss with PCP ii Hep C screening if appropriate. Shingrix vaccine at local pharmacy. Schedule eye exam ASAP.  Discuss PT with PCP.

## 2023-12-14 ENCOUNTER — Other Ambulatory Visit: Payer: Self-pay | Admitting: Family Medicine

## 2023-12-19 ENCOUNTER — Other Ambulatory Visit: Payer: Self-pay | Admitting: Family Medicine

## 2024-01-07 ENCOUNTER — Ambulatory Visit: Payer: Medicare Other | Admitting: Cardiology

## 2024-01-16 ENCOUNTER — Other Ambulatory Visit: Payer: Self-pay | Admitting: Family Medicine

## 2024-01-18 ENCOUNTER — Ambulatory Visit: Payer: Medicare Other | Attending: Cardiology | Admitting: Cardiology

## 2024-01-18 ENCOUNTER — Encounter: Payer: Self-pay | Admitting: Cardiology

## 2024-01-18 VITALS — BP 130/62 | HR 70 | Ht 61.5 in | Wt 209.2 lb

## 2024-01-18 DIAGNOSIS — I10 Essential (primary) hypertension: Secondary | ICD-10-CM

## 2024-01-18 DIAGNOSIS — G4733 Obstructive sleep apnea (adult) (pediatric): Secondary | ICD-10-CM | POA: Diagnosis not present

## 2024-01-18 NOTE — Patient Instructions (Signed)
Medication Instructions:  The current medical regimen is effective;  continue present plan and medications.  *If you need a refill on your cardiac medications before your next appointment, please call your pharmacy*  Follow-Up: At [facility], you and your health needs are our priority.  As part of our continuing mission to provide you with exceptional heart care, we have created designated Provider Care Teams.  These Care Teams include your primary Cardiologist (physician) and Advanced Practice Providers (APPs -  Physician Assistants and Nurse Practitioners) who all work together to provide you with the care you need, when you need it.  We recommend signing up for the patient portal called "MyChart".  Sign up information is provided on this After Visit Summary.  MyChart is used to connect with patients for Virtual Visits (Telemedicine).  Patients are able to view lab/test results, encounter notes, upcoming appointments, etc.  Non-urgent messages can be sent to your provider as well.   To learn more about what you can do with MyChart, go to [email or web address].    Your next appointment:   1 year(s)  Provider:   Dr Armanda Magic

## 2024-01-18 NOTE — Progress Notes (Signed)
 Sleep Medicine office note    Date:  01/18/2024   ID:  Kayla Bennett, DOB [birth year], MRN [id number]  PCP:  Everrett Coombe, DO  Cardiologist:  Laurance Flatten, MD  Chief Complaint  Patient presents with   Sleep Apnea    History of Present Illness:  Kayla Bennett is a 73 y.o. female with a history of diabetes, atrial fibrillation, GERD, hypertension who was referred by Dr. Elberta Fortis for evaluation for possible obstructive sleep apnea as a cause of atrial fibrillation.  She underwent home sleep study on 11/18/2021 which showed severe obstructive sleep apnea with an AHI of 55.8/h, moderate central sleep apnea with AHIc of 27.5/h with 39% of the time Cheyne-Stokes respirations.  She had nocturnal hypoxemia with O2 sat nadir 71% and had persistent O2 desaturations less than 88% for over 1 hour.    She underwent CPAP titration on 01/29/2022 but due to ongoing respiratory events could not be adequately titrated and was started on BiPAP titration and an auto BiPAP with IPAP max 20 cm H2O, EPAP min 5 cm H2O and pressure support 4 cm H2O was ordered.    She is doing well with her PAP device and thinks that she has gotten used to it.  She tolerates the full face mask and feels the pressure is adequate.  She goes to bed around 11pm and falls asleep quickly and then gets up around 8am.  She feels sleepy during the day and has to nap.  She denies any significant nasal dryness or nasal congestion.  She has had problems with mouth dryness but did not know she could adjust the humidity.  She does not think that she snores.    Past Medical History:  Diagnosis Date   Allergy 1978   Food allergies, seasonal allergies, medicine allergies   Anemia    Arthritis    Asthma    Blood transfusion without reported diagnosis 1985   Breast cancer (HCC)    Diabetes (HCC)    Dyspnea    Dysrhythmia    afibb on occasion   Family history of adverse reaction to anesthesia    mother had reaction to versed...during  surgery, stopped breathing   Fibromyalgia    GERD (gastroesophageal reflux disease)    Headache    History of hiatal hernia    Hyperlipidemia 1990?   Hypertension    Migraines    Osteopenia    Osteoporosis    Oxygen deficiency 2023   Psoriasis    Sleep apnea 2023   Yeast infection 10/30/2021    Past Surgical History:  Procedure Laterality Date   ABDOMINAL HYSTERECTOMY     APPENDECTOMY     ATRIAL FIBRILLATION ABLATION N/A 12/10/2021   Procedure: ATRIAL FIBRILLATION ABLATION;  Surgeon: Regan Lemming, MD;  Location: [facility];  Service: Cardiovascular;  Laterality: N/A;   BREAST LUMPECTOMY WITH RADIOACTIVE SEED AND SENTINEL LYMPH NODE BIOPSY Right 02/18/2021   Procedure: RIGHT BREAST LUMPECTOMY WITH RADIOACTIVE SEED AND SENTINEL LYMPH NODE BIOPSY;  Surgeon: Almond Lint, MD;  Location: [facility] OR;  Service: General;  Laterality: Right;   BREAST SURGERY  2022   broken wrist     CARDIOVERSION N/A 04/04/2021   Procedure: CARDIOVERSION;  Surgeon: Quintella Reichert, MD;  Location: [facility] ENDOSCOPY;  Service: Cardiovascular;  Laterality: N/A;   CARDIOVERSION N/A 08/14/2021   Procedure: CARDIOVERSION;  Surgeon: Sande Rives, MD;  Location: [facility] ENDOSCOPY;  Service: Cardiovascular;  Laterality: N/A;   CARDIOVERSION N/A 09/19/2021  Procedure: CARDIOVERSION;  Surgeon: Sande Rives, MD;  Location: [facility] ENDOSCOPY;  Service: Cardiovascular;  Laterality: N/A;   CESAREAN SECTION  1976 and 1977   CHOLECYSTECTOMY     FRACTURE SURGERY     right wrist   HYSTERECTOMY ABDOMINAL WITH SALPINGO-OOPHORECTOMY     LYSIS OF ADHESION     SHOULDER SURGERY Left    TUBAL LIGATION      Current Medications: Current Meds  Medication Sig   acetaminophen (TYLENOL) 500 MG tablet Take 500 mg by mouth every 6 (six) hours as needed for moderate pain, headache or mild pain.   albuterol (VENTOLIN HFA) 108 (90 Base) MCG/ACT inhaler Inhale 2 puffs into the lungs every 6 (six) hours as needed for  wheezing or shortness of breath.   apixaban (ELIQUIS) 5 MG TABS tablet TAKE ONE TABLET BY MOUTH TWICE DAILY   Calcium Carbonate (CALCIUM 600 PO) Take by mouth.   cetirizine (ZYRTEC) 10 MG tablet Take 10 mg by mouth in the morning.   cholecalciferol (VITAMIN D3) 25 MCG (1000 UNIT) tablet Take 1,000-2,000 Units by mouth See admin instructions. Take 1000 units in the morning and 2000 units at bedtime   ferrous sulfate 325 (65 FE) MG tablet Take 325 mg by mouth every morning.   fluorouracil (EFUDEX) 5 % cream Apply 1 application  topically 2 (two) times daily as needed (precancerous spots on right temple and left eyebrow). Mix with calcipotriene 0.005% cream and apply topically twice daily for five days - as needed for precancerous on right temple and left eyebrow   gabapentin (NEURONTIN) 100 MG capsule TAKE THREE CAPSULES BY MOUTH AT BEDTIME   hydrocortisone 2.5 % cream Apply 1 Application topically 2 (two) times daily.   ibuprofen (ADVIL) 200 MG tablet Take 200 mg by mouth every 6 (six) hours as needed for mild pain (pain score 1-3).   losartan-hydrochlorothiazide (HYZAAR) 100-25 MG tablet Take 1 tablet by mouth daily.   metFORMIN (GLUCOPHAGE-XR) 500 MG 24 hr tablet TAKE ONE TABLET BY MOUTH EVERY DAY   metoprolol succinate (TOPROL-XL) 100 MG 24 hr tablet Take 1 tablet (100 mg total) by mouth daily. Take with or immediately following a meal.   pantoprazole (PROTONIX) 40 MG tablet TAKE ONE TABLET BY MOUTH TWICE DAILY   potassium chloride SA (KLOR-CON M) 20 MEQ tablet Take 1 tablet (20 mEq total) by mouth daily.   Prenatal Vit-Fe Fumarate-FA (PRENATAL VITAMIN PLUS LOW IRON PO) Take 1 tablet by mouth in the morning.   tamoxifen (NOLVADEX) 20 MG tablet Take 1 tablet (20 mg total) by mouth every morning.   venlafaxine XR (EFFEXOR-XR) 37.5 MG 24 hr capsule Take 1 capsule (37.5 mg total) by mouth at bedtime.   venlafaxine XR (EFFEXOR-XR) 75 MG 24 hr capsule TAKE ONE CAPSULE BY MOUTH EVERY DAY   vitamin C  (ASCORBIC ACID) 500 MG tablet Take 500 mg by mouth daily.    Allergies:   Lotensin [benazepril hcl], Morphine, Erythromycin base, Hydrocodone, Other, Banana, Chocolate, Gluten meal, Tizanidine, and Wound dressing adhesive   Social History   Socioeconomic History   Marital status: Married    Spouse name: Not on file   Number of children: 2   Years of education: Not on file   Highest education level: Some college, no degree  Occupational History   Not on file  Tobacco Use   Smoking status: Never   Smokeless tobacco: Never  Vaping Use   Vaping status: Never Used  Substance and Sexual Activity  Alcohol use: Never   Drug use: Never   Sexual activity: Not Currently  Other Topics Concern   Not on file  Social History Narrative   Not on file   Social Drivers of Health   Financial Resource Strain: Low Risk  (12/01/2023)   Overall Financial Resource Strain (CARDIA)    Difficulty of Paying Living Expenses: Not very hard  Food Insecurity: No Food Insecurity (12/01/2023)   Hunger Vital Sign    Worried About Running Out of Food in the Last Year: Never true    Ran Out of Food in the Last Year: Never true  Transportation Needs: No Transportation Needs (12/01/2023)   PRAPARE - Administrator, Civil Service (Medical): No    Lack of Transportation (Non-Medical): No  Physical Activity: Inactive (12/01/2023)   Exercise Vital Sign    Days of Exercise per Week: 0 days    Minutes of Exercise per Session: 0 min  Stress: No Stress Concern Present (12/01/2023)   [employer] of Occupational Health - Occupational Stress Questionnaire    Feeling of Stress : Only a little  Social Connections: Moderately Isolated (12/01/2023)   Social Connection and Isolation Panel [NHANES]    Frequency of Communication with Friends and Family: Three times a week    Frequency of Social Gatherings with Friends and Family: Once a week    Attends Religious Services: Never    Database administrator or  Organizations: No    Attends Engineer, structural: Never    Marital Status: Married     Family History:  The patient's family history includes Arthritis in her maternal aunt and mother; Atrial fibrillation in her cousin and mother; Cancer in her father, maternal uncle, mother, paternal uncle, and paternal uncle; Colon cancer in her cousin and paternal uncle; Colon cancer (age of onset: 66) in her father; Diabetes in her maternal aunt, maternal aunt, maternal aunt, maternal uncle, mother, paternal aunt, paternal uncle, paternal uncle, paternal uncle, paternal uncle, and sister; Early death in her father; Hearing loss in her maternal aunt, maternal uncle, paternal aunt, paternal uncle, and paternal uncle; Heart attack (age of onset: 55) in her father; Heart attack (age of onset: 65) in her paternal grandmother; Heart attack (age of onset: 39) in her mother; Heart disease in her father, maternal aunt, mother, paternal uncle, paternal uncle, and paternal uncle; Hyperlipidemia in her sister; Hypertension in her father, mother, paternal uncle, and sister; Obesity in her paternal uncle and paternal uncle; Stroke in her maternal grandmother, maternal uncle, and paternal grandfather.   ROS:   Please see the history of present illness.    ROS All other systems reviewed and are negative.      No data to display             PHYSICAL EXAM:   VS:  BP 130/62   Pulse 70   Ht 5' 1.5" (1.562 m)   Wt 209 lb 3.2 oz (94.9 kg)   SpO2 94%   BMI 38.89 kg/m    GEN: Well nourished, well developed in no acute distress HEENT: Normal NECK: No JVD; No carotid bruits LYMPHATICS: No lymphadenopathy CARDIAC:RRR, no murmurs, rubs, gallops RESPIRATORY:  Clear to auscultation without rales, wheezing or rhonchi  ABDOMEN: Soft, non-tender, non-distended MUSCULOSKELETAL:  No edema; No deformity  SKIN: Warm and dry NEUROLOGIC:  Alert and oriented x 3 PSYCHIATRIC:  Normal affect  Wt Readings from Last 3  Encounters:  01/18/24 209 lb 3.2 oz (94.9  kg)  12/01/23 205 lb (93 kg)  10/09/23 211 lb (95.7 kg)      Studies/Labs Reviewed:    Recent Labs: 09/22/2023: ALT 36; BUN 19; Creatinine 0.80; Hemoglobin 11.6; Platelet Count 168; Potassium 3.7; Sodium 139   Lipid Panel No results found for: "CHOL", "TRIG", "HDL", "CHOLHDL", "VLDL", "LDLCALC", "LDLDIRECT"   CHA2DS2-VASc Score =     This indicates a  % annual risk of stroke. The patient's score is based upon:     Additional studies/ records that were reviewed today include:   Home sleep study, CPAP/BiPAP titration and Pap compliance download   ASSESSMENT:    1. OSA treated with BiPAP   2. Essential hypertension       PLAN:  In order of problems listed above:  OSA - The patient is tolerating PAP therapy well without any problems. The PAP download performed by his DME was personally reviewed and interpreted by me today and showed an AHI of 2.9 /hr on auto CPAP from 4-20 cm H2O with 90% compliance in using more than 4 hours nightly.  The patient has been using and benefiting from PAP use and will continue to benefit from therapy.   Hypertension -BP is controlled on exam today -Continue with drug management with losartan HCT 100-25 mg daily, Toprol-XL 100 mg daily with as needed refills -I have personally reviewed and interpreted outside labs performed by patient's PCP which showed serum creatinine 0.8 and potassium 3.7 on 09/22/2023   Time Spent: 25 minutes total time of encounter, including 15 minutes spent in face-to-face patient care on the date of this encounter. This time includes coordination of care and counseling regarding above mentioned problem list. Remainder of non-face-to-face time involved reviewing chart documents/testing relevant to the patient encounter and documentation in the medical record. I have independently reviewed documentation from referring provider  Medication Adjustments/Labs and Tests  Ordered: Current medicines are reviewed at length with the patient today.  Concerns regarding medicines are outlined above.  Medication changes, Labs and Tests ordered today are listed in the Patient Instructions below.     Signed, Armanda Magic, MD [facility], [facility] 01/18/2024 9:48 AM    [facility] [address] [place], [place], [place]  [address] Phone: [phone]; Fax: [phone]

## 2024-02-04 ENCOUNTER — Other Ambulatory Visit: Payer: Self-pay | Admitting: Family Medicine

## 2024-02-04 ENCOUNTER — Telehealth: Payer: Self-pay

## 2024-02-04 DIAGNOSIS — I4819 Other persistent atrial fibrillation: Secondary | ICD-10-CM

## 2024-02-04 DIAGNOSIS — G4734 Idiopathic sleep related nonobstructive alveolar hypoventilation: Secondary | ICD-10-CM

## 2024-02-04 DIAGNOSIS — M797 Fibromyalgia: Secondary | ICD-10-CM

## 2024-02-04 DIAGNOSIS — D6869 Other thrombophilia: Secondary | ICD-10-CM

## 2024-02-04 DIAGNOSIS — I10 Essential (primary) hypertension: Secondary | ICD-10-CM

## 2024-02-04 DIAGNOSIS — G4733 Obstructive sleep apnea (adult) (pediatric): Secondary | ICD-10-CM

## 2024-02-04 DIAGNOSIS — I480 Paroxysmal atrial fibrillation: Secondary | ICD-10-CM

## 2024-02-04 NOTE — Telephone Encounter (Signed)
-----   Message from Armanda Magic sent at 01/18/2024  9:50 AM EST ----- Order new mask of choice and PAP supplies

## 2024-02-04 NOTE — Telephone Encounter (Signed)
 Order for new mask of choice and PAP supplies sent to [facility] today.

## 2024-02-05 DIAGNOSIS — N3001 Acute cystitis with hematuria: Secondary | ICD-10-CM | POA: Diagnosis not present

## 2024-02-05 DIAGNOSIS — R350 Frequency of micturition: Secondary | ICD-10-CM | POA: Diagnosis not present

## 2024-02-05 DIAGNOSIS — N760 Acute vaginitis: Secondary | ICD-10-CM | POA: Diagnosis not present

## 2024-02-10 ENCOUNTER — Ambulatory Visit: Payer: Medicare Other | Attending: Cardiology | Admitting: Cardiology

## 2024-02-10 ENCOUNTER — Other Ambulatory Visit (HOSPITAL_COMMUNITY): Payer: Self-pay

## 2024-02-10 ENCOUNTER — Encounter: Payer: Self-pay | Admitting: Cardiology

## 2024-02-10 VITALS — BP 136/80 | HR 76 | Ht 61.0 in | Wt 209.8 lb

## 2024-02-10 DIAGNOSIS — R0609 Other forms of dyspnea: Secondary | ICD-10-CM | POA: Insufficient documentation

## 2024-02-10 DIAGNOSIS — Z8679 Personal history of other diseases of the circulatory system: Secondary | ICD-10-CM | POA: Insufficient documentation

## 2024-02-10 DIAGNOSIS — Z01812 Encounter for preprocedural laboratory examination: Secondary | ICD-10-CM | POA: Insufficient documentation

## 2024-02-10 DIAGNOSIS — Z79899 Other long term (current) drug therapy: Secondary | ICD-10-CM | POA: Diagnosis not present

## 2024-02-10 DIAGNOSIS — E782 Mixed hyperlipidemia: Secondary | ICD-10-CM | POA: Insufficient documentation

## 2024-02-10 DIAGNOSIS — I2510 Atherosclerotic heart disease of native coronary artery without angina pectoris: Secondary | ICD-10-CM | POA: Insufficient documentation

## 2024-02-10 LAB — CBC

## 2024-02-10 MED ORDER — ISOSORBIDE MONONITRATE ER 30 MG PO TB24: 30.0000 mg | ORAL_TABLET | Freq: Every day | ORAL | 3 refills | Status: DC

## 2024-02-10 MED ORDER — REPATHA SURECLICK 140 MG/ML ~~LOC~~ SOAJ: 140.0000 mg | SUBCUTANEOUS | 2 refills | Status: DC

## 2024-02-10 NOTE — Patient Instructions (Addendum)
 Medication Instructions:  Your physician has recommended you make the following change in your medication:  START: Imdur 30 mg once daily *If you need a refill on your cardiac medications before your next appointment, please call your pharmacy*   Lab Work: CMET, Mag, CBC, Lipids, Lp(a)  If you have labs (blood work) drawn today and your tests are completely normal, you will receive your results only by: MyChart Message (if you have MyChart) OR A paper copy in the mail If you have any lab test that is abnormal or we need to change your treatment, we will call you to review the results.   Testing/Procedures: Your physician has requested that you have an echocardiogram. Echocardiography is a painless test that uses sound waves to create images of your heart. It provides your doctor with information about the size and shape of your heart and how well your heart's chambers and valves are working. This procedure takes approximately one hour. There are no restrictions for this procedure. Please do NOT wear cologne, perfume, aftershave, or lotions (deodorant is allowed). Please arrive 15 minutes prior to your appointment time.  Please note: We ask at that you not bring children with you during ultrasound (echo/ vascular) testing. Due to room size and safety concerns, children are not allowed in the ultrasound rooms during exams. Our front office staff cannot provide observation of children in our lobby area while testing is being conducted. An adult accompanying a patient to their appointment will only be allowed in the ultrasound room at the discretion of the ultrasound technician under special circumstances. We apologize for any inconvenience.   And     JULIAN ASKIN  02/10/2024  You are scheduled for a Cardiac Catheterization on Thursday, April 3 with Dr. Verne Carrow.  1. Please arrive at the [facility] (Main Entrance A) at [facility]: [address] [place],  [place] [address] at 7:00 AM (This time is 2 hour(s) before your procedure to ensure your preparation).   Free valet parking service is available. You will check in at ADMITTING. The support person will be asked to wait in the waiting room.  It is OK to have someone drop you off and come back when you are ready to be discharged.    Special note: Every effort is made to have your procedure done on time. Please understand that emergencies sometimes delay scheduled procedures.  2. Diet: Do not eat solid foods after midnight.  The patient may have clear liquids until 5am upon the day of the procedure.  3. Labs: You will need to have blood drawn on labs will be done todayYou do not need to be fasting.  4. Medication instructions in preparation for your procedure:   Contrast Allergy: No   *For reference purposes while preparing patient instructions.   Delete this med list prior to printing instructions for patient.*  Stop taking Eliquis (Apixiban) on Tuesday, April 1. And April 2   Stop taking, HTCZ (Hydrochlorothiazide) Wednesday, April 2,    Do not take Diabetes Med Glucophage (Metformin) on the day of the procedure and HOLD 48 HOURS AFTER THE PROCEDURE.  On the morning of your procedure, take your Aspirin 81 mg and any morning medicines NOT listed above.  You may use sips of water.  5. Plan to go home the same day, you will only stay overnight if medically necessary. 6. Bring a current list of your medications and current insurance cards. 7. You MUST have a responsible person  to drive you home. 8. Someone MUST be with you the first 24 hours after you arrive home or your discharge will be delayed. 9. Please wear clothes that are easy to get on and off and wear slip-on shoes.  Thank you for allowing [place] to care for you!   -- [facility]     Follow-Up: At [facility], you and your health needs are our priority.  As part of our continuing mission to  provide you with exceptional heart care, we have created designated Provider Care Teams.  These Care Teams include your primary Cardiologist (physician) and Advanced Practice Providers (APPs -  Physician Assistants and Nurse Practitioners) who all work together to provide you with the care you need, when you need it.  Your next appointment:   2-4 week(s)  Provider:   Thomasene Ripple, DO

## 2024-02-10 NOTE — H&P (View-Only) (Signed)
 Cardiology Office Note:    Date:  02/10/2024   ID:  Kayla Bennett, DOB 09-23-1951, MRN [id number]  PCP:  Everrett Coombe, DO  Cardiologist:  Thomasene Ripple, DO  Electrophysiologist:  Regan Lemming, MD     Referri  " I AM having shortness of breath"  History of Present Illness:    Kayla Bennett is a 73 y.o. female with a hx of medical history of diabetes, hypertension, high cholesterol, and atrial fibrillation, presents with a chief complaint of chest tightness and shortness of breath upon exertion. The patient reports that these symptoms occur even with minimal exertion, such as walking short distances. The patient describes the chest discomfort as a tightness, not a pain, and notes that it is often accompanied by shortness of breath. The patient also reports that her oxygen levels sometimes drop to 87-88 during the daytime. The patient uses a BiPAP machine and supplemental oxygen at night. The patient has been avoiding walking due to the discomfort and shortness of breath. The patient also reports a history of reflux, which is managed with medication. The patient's diabetes is well-controlled, but she has a history of intolerance to statins for cholesterol management. The patient also takes tamoxifen for a history of breast cancer.  Past Medical History:  Diagnosis Date   Allergy 1978   Food allergies, seasonal allergies, medicine allergies   Anemia    Arthritis    Asthma    Blood transfusion without reported diagnosis 1985   Breast cancer (HCC)    Diabetes (HCC)    Dyspnea    Dysrhythmia    afibb on occasion   Family history of adverse reaction to anesthesia    mother had reaction to versed...during surgery, stopped breathing   Fibromyalgia    GERD (gastroesophageal reflux disease)    Headache    History of hiatal hernia    Hyperlipidemia 1990?   Hypertension    Migraines    Osteopenia    Osteoporosis    Oxygen deficiency 2023   Psoriasis    Sleep apnea 2023    Yeast infection 10/30/2021    Past Surgical History:  Procedure Laterality Date   ABDOMINAL HYSTERECTOMY     APPENDECTOMY     ATRIAL FIBRILLATION ABLATION N/A 12/10/2021   Procedure: ATRIAL FIBRILLATION ABLATION;  Surgeon: Regan Lemming, MD;  Location: [facility];  Service: Cardiovascular;  Laterality: N/A;   BREAST LUMPECTOMY WITH RADIOACTIVE SEED AND SENTINEL LYMPH NODE BIOPSY Right 02/18/2021   Procedure: RIGHT BREAST LUMPECTOMY WITH RADIOACTIVE SEED AND SENTINEL LYMPH NODE BIOPSY;  Surgeon: Almond Lint, MD;  Location: [facility] OR;  Service: General;  Laterality: Right;   BREAST SURGERY  2022   broken wrist     CARDIOVERSION N/A 04/04/2021   Procedure: CARDIOVERSION;  Surgeon: Quintella Reichert, MD;  Location: [facility] ENDOSCOPY;  Service: Cardiovascular;  Laterality: N/A;   CARDIOVERSION N/A 08/14/2021   Procedure: CARDIOVERSION;  Surgeon: Sande Rives, MD;  Location: [facility] ENDOSCOPY;  Service: Cardiovascular;  Laterality: N/A;   CARDIOVERSION N/A 09/19/2021   Procedure: CARDIOVERSION;  Surgeon: Sande Rives, MD;  Location: [facility] ENDOSCOPY;  Service: Cardiovascular;  Laterality: N/A;   CESAREAN SECTION  1976 and 1977   CHOLECYSTECTOMY     FRACTURE SURGERY     right wrist   HYSTERECTOMY ABDOMINAL WITH SALPINGO-OOPHORECTOMY     LYSIS OF ADHESION     SHOULDER SURGERY Left    TUBAL LIGATION      Current Medications: Current Meds  Medication Sig   acetaminophen (TYLENOL) 500 MG tablet Take 500 mg by mouth every 6 (six) hours as needed for moderate pain, headache or mild pain.   albuterol (VENTOLIN HFA) 108 (90 Base) MCG/ACT inhaler Inhale 2 puffs into the lungs every 6 (six) hours as needed for wheezing or shortness of breath.   apixaban (ELIQUIS) 5 MG TABS tablet TAKE ONE TABLET BY MOUTH TWICE DAILY   calcipotriene (DOVONOX) 0.005 % cream Apply 1 application  topically See admin instructions. Mix with fluorouracil 5% cream and apply topically twice daily for five  days - as needed for precancerous on right cheek.   Calcium Carbonate (CALCIUM 600 PO) Take by mouth.   cetirizine (ZYRTEC) 10 MG tablet Take 10 mg by mouth in the morning.   cholecalciferol (VITAMIN D3) 25 MCG (1000 UNIT) tablet Take 1,000-2,000 Units by mouth See admin instructions. Take 1000 units in the morning and 2000 units at bedtime   Evolocumab (REPATHA SURECLICK) 140 MG/ML SOAJ Inject 140 mg into the skin every 14 (fourteen) days.   ferrous sulfate 325 (65 FE) MG tablet Take 325 mg by mouth every morning.   fluorouracil (EFUDEX) 5 % cream Apply 1 application  topically 2 (two) times daily as needed (precancerous spots on right temple and left eyebrow). Mix with calcipotriene 0.005% cream and apply topically twice daily for five days - as needed for precancerous on right temple and left eyebrow   gabapentin (NEURONTIN) 100 MG capsule TAKE THREE CAPSULES BY MOUTH AT BEDTIME   hydrocortisone 2.5 % cream Apply 1 Application topically 2 (two) times daily.   ibuprofen (ADVIL) 200 MG tablet Take 200 mg by mouth every 6 (six) hours as needed for mild pain (pain score 1-3).   isosorbide mononitrate (IMDUR) 30 MG 24 hr tablet Take 1 tablet (30 mg total) by mouth daily.   losartan-hydrochlorothiazide (HYZAAR) 100-25 MG tablet Take 1 tablet by mouth daily.   metFORMIN (GLUCOPHAGE-XR) 500 MG 24 hr tablet TAKE ONE TABLET BY MOUTH EVERY DAY   metoprolol succinate (TOPROL-XL) 100 MG 24 hr tablet Take 1 tablet (100 mg total) by mouth daily. Take with or immediately following a meal.   pantoprazole (PROTONIX) 40 MG tablet TAKE ONE TABLET BY MOUTH TWICE DAILY   potassium chloride SA (KLOR-CON M) 20 MEQ tablet Take 1 tablet (20 mEq total) by mouth daily.   Prenatal Vit-Fe Fumarate-FA (PRENATAL VITAMIN PLUS LOW IRON PO) Take 1 tablet by mouth in the morning.   tamoxifen (NOLVADEX) 20 MG tablet Take 1 tablet (20 mg total) by mouth every morning.   triamcinolone (KENALOG) 0.1 % Apply 1 application  topically 2  (two) times daily as needed (psoriasis). Mixed with Cetaphil 1:1   UNABLE TO FIND OUTPATIENT PHYSICAL THERAPY    Diagnosis: Generalized debility due to UTI, acute gastroenteritis   venlafaxine XR (EFFEXOR-XR) 37.5 MG 24 hr capsule Take 1 capsule (37.5 mg total) by mouth at bedtime.   venlafaxine XR (EFFEXOR-XR) 75 MG 24 hr capsule TAKE ONE CAPSULE BY MOUTH EVERY DAY   vitamin C (ASCORBIC ACID) 500 MG tablet Take 500 mg by mouth daily.     Allergies:   Lotensin [benazepril hcl], Morphine, Erythromycin base, Hydrocodone, Other, Banana, Chocolate, Gluten meal, Tizanidine, and Wound dressing adhesive   Social History   Socioeconomic History   Marital status: Married    Spouse name: Not on file   Number of children: 2   Years of education: Not on file   Highest education level: Some college,  no degree  Occupational History   Not on file  Tobacco Use   Smoking status: Never   Smokeless tobacco: Never  Vaping Use   Vaping status: Never Used  Substance and Sexual Activity   Alcohol use: Never   Drug use: Never   Sexual activity: Not Currently  Other Topics Concern   Not on file  Social History Narrative   Not on file   Social Drivers of Health   Financial Resource Strain: Low Risk  (12/01/2023)   Overall Financial Resource Strain (CARDIA)    Difficulty of Paying Living Expenses: Not very hard  Food Insecurity: No Food Insecurity (12/01/2023)   Hunger Vital Sign    Worried About Running Out of Food in the Last Year: Never true    Ran Out of Food in the Last Year: Never true  Transportation Needs: No Transportation Needs (12/01/2023)   PRAPARE - Administrator, Civil Service (Medical): No    Lack of Transportation (Non-Medical): No  Physical Activity: Inactive (12/01/2023)   Exercise Vital Sign    Days of Exercise per Week: 0 days    Minutes of Exercise per Session: 0 min  Stress: No Stress Concern Present (12/01/2023)   [employer] of Occupational Health -  Occupational Stress Questionnaire    Feeling of Stress : Only a little  Social Connections: Moderately Isolated (12/01/2023)   Social Connection and Isolation Panel [NHANES]    Frequency of Communication with Friends and Family: Three times a week    Frequency of Social Gatherings with Friends and Family: Once a week    Attends Religious Services: Never    Database administrator or Organizations: No    Attends Engineer, structural: Never    Marital Status: Married     Family History: The patient's family history includes Arthritis in her maternal aunt and mother; Atrial fibrillation in her cousin and mother; Cancer in her father, maternal uncle, mother, paternal uncle, and paternal uncle; Colon cancer in her cousin and paternal uncle; Colon cancer (age of onset: 72) in her father; Diabetes in her maternal aunt, maternal aunt, maternal aunt, maternal uncle, mother, paternal aunt, paternal uncle, paternal uncle, paternal uncle, paternal uncle, and sister; Early death in her father; Hearing loss in her maternal aunt, maternal uncle, paternal aunt, paternal uncle, and paternal uncle; Heart attack (age of onset: 41) in her father; Heart attack (age of onset: 29) in her paternal grandmother; Heart attack (age of onset: 66) in her mother; Heart disease in her father, maternal aunt, mother, paternal uncle, paternal uncle, and paternal uncle; Hyperlipidemia in her sister; Hypertension in her father, mother, paternal uncle, and sister; Obesity in her paternal uncle and paternal uncle; Stroke in her maternal grandmother, maternal uncle, and paternal grandfather.  ROS:   Review of Systems  Constitution: Negative for decreased appetite, fever and weight gain.  HENT: Negative for congestion, ear discharge, hoarse voice and sore throat.   Eyes: Negative for discharge, redness, vision loss in right eye and visual halos.  Cardiovascular: Negative for chest pain, dyspnea on exertion, leg swelling,  orthopnea and palpitations.  Respiratory: Negative for cough, hemoptysis, shortness of breath and snoring.   Endocrine: Negative for heat intolerance and polyphagia.  Hematologic/Lymphatic: Negative for bleeding problem. Does not bruise/bleed easily.  Skin: Negative for flushing, nail changes, rash and suspicious lesions.  Musculoskeletal: Negative for arthritis, joint pain, muscle cramps, myalgias, neck pain and stiffness.  Gastrointestinal: Negative for abdominal pain, bowel incontinence, diarrhea  and excessive appetite.  Genitourinary: Negative for decreased libido, genital sores and incomplete emptying.  Neurological: Negative for brief paralysis, focal weakness, headaches and loss of balance.  Psychiatric/Behavioral: Negative for altered mental status, depression and suicidal ideas.  Allergic/Immunologic: Negative for HIV exposure and persistent infections.    EKGs/Labs/Other Studies Reviewed:    The following studies were reviewed today:   EKG: None today   Recent Labs: 09/22/2023: ALT 36; BUN 19; Creatinine 0.80; Hemoglobin 11.6; Platelet Count 168; Potassium 3.7; Sodium 139  Recent Lipid Panel No results found for: "CHOL", "TRIG", "HDL", "CHOLHDL", "VLDL", "LDLCALC", "LDLDIRECT"  Physical Exam:    VS:  BP 136/80 (BP Location: Right Arm, Patient Position: Sitting, Cuff Size: Large)   Pulse 76   Ht 5\' 1"  (1.549 m)   Wt 209 lb 12.8 oz (95.2 kg)   SpO2 96%   BMI 39.64 kg/m     Wt Readings from Last 3 Encounters:  02/10/24 209 lb 12.8 oz (95.2 kg)  01/18/24 209 lb 3.2 oz (94.9 kg)  12/01/23 205 lb (93 kg)     GEN: Well nourished, well developed in no acute distress HEENT: Normal NECK: No JVD; No carotid bruits LYMPHATICS: No lymphadenopathy CARDIAC: S1S2 noted,RRR, no murmurs, rubs, gallops RESPIRATORY:  Clear to auscultation without rales, wheezing or rhonchi  ABDOMEN: Soft, non-tender, non-distended, +bowel sounds, no guarding. EXTREMITIES: No edema, No cyanosis,  no clubbing MUSCULOSKELETAL:  No deformity  SKIN: Warm and dry NEUROLOGIC:  Alert and oriented x 3, non-focal PSYCHIATRIC:  Normal affect, good insight  ASSESSMENT:    1. History of angina   2. Medication management   3. Mixed hyperlipidemia   4. DOE (dyspnea on exertion)   5. Pre-procedure lab exam   6. CAD in native artery    PLAN:    Chest pain concerning for Angina Intermittent chest tightness and dyspnea on exertion suggestive of angina. High risk due to diabetes, hyperlipidemia, and family history of coronary artery disease. Atypical presentation due to diabetes and female sex. Coronary artery calcification noted on CT scan. - Order right and left heart catheterization. Explained procedure, risks, and post-procedure requirements. Informed Consent   Shared Decision Making/Informed Consent The risks [stroke (1 in 1000), death (1 in 1000), kidney failure [usually temporary] (1 in 500), bleeding (1 in 200), allergic reaction [possibly serious] (1 in 200)], benefits (diagnostic support and management of coronary artery disease) and alternatives of a cardiac catheterization were discussed in detail with Kayla Bennett and she is willing to proceed.     - Start Imdur. - Order echocardiogram. - Order blood work for lipids, renal function, and CBC. - Refer to pharmacy clinic for Repatha authorization and monitoring.   Coronary artery calcification noted on CT scan. High risk due to diabetes, hyperlipidemia, and family history. Repatha recommended for hyperlipidemia management. - Restart Repatha injections and monitor for adverse effects. - - Monitor hemoglobin levels due to Eliquis use.  Atrial Fibrillation Persistent atrial fibrillation managed with Eliquis.  Hyperlipidemia Hyperlipidemia with previous statin intolerance. Dietary modification helped but not normalized levels. Repatha considered as alternative. - Order lipid panel. - Restart Repatha injections.  Diabetes  [place] Well-controlled diabetes contributing to cardiovascular risk.  Hypertension Hypertension managed with losartan and HCTZ, contributing to cardiovascular risk.  Sleep Apnea Sleep apnea managed with BiPAP. No pulmonary specialist involved. - Refer to pulmonary specialist for evaluation  The patient is in agreement with the above plan. The patient left the office in stable condition.  The patient will follow  up in   Medication Adjustments/Labs and Tests Ordered: Current medicines are reviewed at length with the patient today.  Concerns regarding medicines are outlined above.  Orders Placed This Encounter  Procedures   Comprehensive Metabolic Panel (CMET)   Magnesium   CBC   Lipid panel   Lipoprotein A (LPA)   AMB Referral to [facility] Pharm[facility]   ECHOCARDIOGRAM COMPLETE   LEFT AND RIGHT HEART CATHETERIZATION WITH CORONARY ANGIOGRAM   Meds ordered this encounter  Medications   isosorbide mononitrate (IMDUR) 30 MG 24 hr tablet    Sig: Take 1 tablet (30 mg total) by mouth daily.    Dispense:  90 tablet    Refill:  3   Evolocumab (REPATHA SURECLICK) 140 MG/ML SOAJ    Sig: Inject 140 mg into the skin every 14 (fourteen) days.    Dispense:  2 mL    Refill:  2    Patient Instructions  Medication Instructions:  Your physician has recommended you make the following change in your medication:  START: Imdur 30 mg once daily *If you need a refill on your cardiac medications before your next appointment, please call your pharmacy*   Lab Work: CMET, Mag, CBC, Lipids, Lp(a)  If you have labs (blood work) drawn today and your tests are completely normal, you will receive your results only by: MyChart Message (if you have MyChart) OR A paper copy in the mail If you have any lab test that is abnormal or we need to change your treatment, we will call you to review the results.   Testing/Procedures: Your physician has requested that you have an echocardiogram. Echocardiography is  a painless test that uses sound waves to create images of your heart. It provides your doctor with information about the size and shape of your heart and how well your heart's chambers and valves are working. This procedure takes approximately one hour. There are no restrictions for this procedure. Please do NOT wear cologne, perfume, aftershave, or lotions (deodorant is allowed). Please arrive 15 minutes prior to your appointment time.  Please note: We ask at that you not bring children with you during ultrasound (echo/ vascular) testing. Due to room size and safety concerns, children are not allowed in the ultrasound rooms during exams. Our front office staff cannot provide observation of children in our lobby area while testing is being conducted. An adult accompanying a patient to their appointment will only be allowed in the ultrasound room at the discretion of the ultrasound technician under special circumstances. We apologize for any inconvenience.   And     Kayla Bennett  02/10/2024  You are scheduled for a Cardiac Catheterization on Thursday, April 3 with Dr. Verne Carrow.  1. Please arrive at the [facility] (Main Entrance A) at [facility]: [address] [place], [place] [address] at 7:00 AM (This time is 2 hour(s) before your procedure to ensure your preparation).   Free valet parking service is available. You will check in at ADMITTING. The support person will be asked to wait in the waiting room.  It is OK to have someone drop you off and come back when you are ready to be discharged.    Special note: Every effort is made to have your procedure done on time. Please understand that emergencies sometimes delay scheduled procedures.  2. Diet: Do not eat solid foods after midnight.  The patient may have clear liquids until 5am upon the day of the procedure.  3. Labs:  You will need to have blood drawn on labs will be done todayYou do not need to be fasting.  4.  Medication instructions in preparation for your procedure:   Contrast Allergy: No   *For reference purposes while preparing patient instructions.   Delete this med list prior to printing instructions for patient.*  Stop taking Eliquis (Apixiban) on Tuesday, April 1. And April 2   Stop taking, HTCZ (Hydrochlorothiazide) Wednesday, April 2,    Do not take Diabetes Med Glucophage (Metformin) on the day of the procedure and HOLD 48 HOURS AFTER THE PROCEDURE.  On the morning of your procedure, take your Aspirin 81 mg and any morning medicines NOT listed above.  You may use sips of water.  5. Plan to go home the same day, you will only stay overnight if medically necessary. 6. Bring a current list of your medications and current insurance cards. 7. You MUST have a responsible person to drive you home. 8. Someone MUST be with you the first 24 hours after you arrive home or your discharge will be delayed. 9. Please wear clothes that are easy to get on and off and wear slip-on shoes.  Thank you for allowing [place] to care for you!   -- [facility]     Follow-Up: At [facility], you and your health needs are our priority.  As part of our continuing mission to provide you with exceptional heart care, we have created designated Provider Care Teams.  These Care Teams include your primary Cardiologist (physician) and Advanced Practice Providers (APPs -  Physician Assistants and Nurse Practitioners) who all work together to provide you with the care you need, when you need it.  Your next appointment:   2-4 week(s)  Provider:   Thomasene Ripple, DO    Adopting a Healthy Lifestyle.  Know what a healthy weight is for you (roughly BMI <25) and aim to maintain this   Aim for 7+ servings of fruits and vegetables daily   65-80+ fluid ounces of water or unsweet tea for healthy kidneys   Limit to max 1 drink of alcohol per day; avoid smoking/tobacco   Limit  animal fats in diet for cholesterol and heart health - choose grass fed whenever available   Avoid highly processed foods, and foods high in saturated/trans fats   Aim for low stress - take time to unwind and care for your mental health   Aim for 150 min of moderate intensity exercise weekly for heart health, and weights twice weekly for bone health   Aim for 7-9 hours of sleep daily   When it comes to diets, agreement about the perfect plan isnt easy to find, even among the experts. Experts at the [facility] of [employer] developed an idea known as the Healthy Eating Plate. Just imagine a plate divided into logical, healthy portions.   The emphasis is on diet quality:   Load up on vegetables and fruits - one-half of your plate: Aim for color and variety, and remember that potatoes dont count.   Go for whole grains - one-quarter of your plate: Whole wheat, barley, wheat berries, quinoa, oats, brown rice, and foods made with them. If you want pasta, go with whole wheat pasta.   Protein power - one-quarter of your plate: Fish, chicken, beans, and nuts are all healthy, versatile protein sources. Limit red meat.   The diet, however, does go beyond the plate, offering a few other suggestions.  Use healthy plant oils, such as olive, canola, soy, corn, sunflower and peanut. Check the labels, and avoid partially hydrogenated oil, which have unhealthy trans fats.   If youre thirsty, drink water. Coffee and tea are good in moderation, but skip sugary drinks and limit milk and dairy products to one or two daily servings.   The type of carbohydrate in the diet is more important than the amount. Some sources of carbohydrates, such as vegetables, fruits, whole grains, and beans-are healthier than others.   Finally, stay active  Signed, Thomasene Ripple, DO  02/10/2024 4:00 PM    [facility]

## 2024-02-10 NOTE — Progress Notes (Signed)
 Cardiology Office Note:    Date:  02/10/2024   ID:  Kayla Bennett, DOB 09-23-1951, MRN [id number]  PCP:  Everrett Coombe, DO  Cardiologist:  Thomasene Ripple, DO  Electrophysiologist:  Regan Lemming, MD     Referri  " I AM having shortness of breath"  History of Present Illness:    Kayla Bennett is a 73 y.o. female with a hx of medical history of diabetes, hypertension, high cholesterol, and atrial fibrillation, presents with a chief complaint of chest tightness and shortness of breath upon exertion. The patient reports that these symptoms occur even with minimal exertion, such as walking short distances. The patient describes the chest discomfort as a tightness, not a pain, and notes that it is often accompanied by shortness of breath. The patient also reports that her oxygen levels sometimes drop to 87-88 during the daytime. The patient uses a BiPAP machine and supplemental oxygen at night. The patient has been avoiding walking due to the discomfort and shortness of breath. The patient also reports a history of reflux, which is managed with medication. The patient's diabetes is well-controlled, but she has a history of intolerance to statins for cholesterol management. The patient also takes tamoxifen for a history of breast cancer.  Past Medical History:  Diagnosis Date   Allergy 1978   Food allergies, seasonal allergies, medicine allergies   Anemia    Arthritis    Asthma    Blood transfusion without reported diagnosis 1985   Breast cancer (HCC)    Diabetes (HCC)    Dyspnea    Dysrhythmia    afibb on occasion   Family history of adverse reaction to anesthesia    mother had reaction to versed...during surgery, stopped breathing   Fibromyalgia    GERD (gastroesophageal reflux disease)    Headache    History of hiatal hernia    Hyperlipidemia 1990?   Hypertension    Migraines    Osteopenia    Osteoporosis    Oxygen deficiency 2023   Psoriasis    Sleep apnea 2023    Yeast infection 10/30/2021    Past Surgical History:  Procedure Laterality Date   ABDOMINAL HYSTERECTOMY     APPENDECTOMY     ATRIAL FIBRILLATION ABLATION N/A 12/10/2021   Procedure: ATRIAL FIBRILLATION ABLATION;  Surgeon: Regan Lemming, MD;  Location: [facility];  Service: Cardiovascular;  Laterality: N/A;   BREAST LUMPECTOMY WITH RADIOACTIVE SEED AND SENTINEL LYMPH NODE BIOPSY Right 02/18/2021   Procedure: RIGHT BREAST LUMPECTOMY WITH RADIOACTIVE SEED AND SENTINEL LYMPH NODE BIOPSY;  Surgeon: Almond Lint, MD;  Location: [facility] OR;  Service: General;  Laterality: Right;   BREAST SURGERY  2022   broken wrist     CARDIOVERSION N/A 04/04/2021   Procedure: CARDIOVERSION;  Surgeon: Quintella Reichert, MD;  Location: [facility] ENDOSCOPY;  Service: Cardiovascular;  Laterality: N/A;   CARDIOVERSION N/A 08/14/2021   Procedure: CARDIOVERSION;  Surgeon: Sande Rives, MD;  Location: [facility] ENDOSCOPY;  Service: Cardiovascular;  Laterality: N/A;   CARDIOVERSION N/A 09/19/2021   Procedure: CARDIOVERSION;  Surgeon: Sande Rives, MD;  Location: [facility] ENDOSCOPY;  Service: Cardiovascular;  Laterality: N/A;   CESAREAN SECTION  1976 and 1977   CHOLECYSTECTOMY     FRACTURE SURGERY     right wrist   HYSTERECTOMY ABDOMINAL WITH SALPINGO-OOPHORECTOMY     LYSIS OF ADHESION     SHOULDER SURGERY Left    TUBAL LIGATION      Current Medications: Current Meds  Medication Sig   acetaminophen (TYLENOL) 500 MG tablet Take 500 mg by mouth every 6 (six) hours as needed for moderate pain, headache or mild pain.   albuterol (VENTOLIN HFA) 108 (90 Base) MCG/ACT inhaler Inhale 2 puffs into the lungs every 6 (six) hours as needed for wheezing or shortness of breath.   apixaban (ELIQUIS) 5 MG TABS tablet TAKE ONE TABLET BY MOUTH TWICE DAILY   calcipotriene (DOVONOX) 0.005 % cream Apply 1 application  topically See admin instructions. Mix with fluorouracil 5% cream and apply topically twice daily for five  days - as needed for precancerous on right cheek.   Calcium Carbonate (CALCIUM 600 PO) Take by mouth.   cetirizine (ZYRTEC) 10 MG tablet Take 10 mg by mouth in the morning.   cholecalciferol (VITAMIN D3) 25 MCG (1000 UNIT) tablet Take 1,000-2,000 Units by mouth See admin instructions. Take 1000 units in the morning and 2000 units at bedtime   Evolocumab (REPATHA SURECLICK) 140 MG/ML SOAJ Inject 140 mg into the skin every 14 (fourteen) days.   ferrous sulfate 325 (65 FE) MG tablet Take 325 mg by mouth every morning.   fluorouracil (EFUDEX) 5 % cream Apply 1 application  topically 2 (two) times daily as needed (precancerous spots on right temple and left eyebrow). Mix with calcipotriene 0.005% cream and apply topically twice daily for five days - as needed for precancerous on right temple and left eyebrow   gabapentin (NEURONTIN) 100 MG capsule TAKE THREE CAPSULES BY MOUTH AT BEDTIME   hydrocortisone 2.5 % cream Apply 1 Application topically 2 (two) times daily.   ibuprofen (ADVIL) 200 MG tablet Take 200 mg by mouth every 6 (six) hours as needed for mild pain (pain score 1-3).   isosorbide mononitrate (IMDUR) 30 MG 24 hr tablet Take 1 tablet (30 mg total) by mouth daily.   losartan-hydrochlorothiazide (HYZAAR) 100-25 MG tablet Take 1 tablet by mouth daily.   metFORMIN (GLUCOPHAGE-XR) 500 MG 24 hr tablet TAKE ONE TABLET BY MOUTH EVERY DAY   metoprolol succinate (TOPROL-XL) 100 MG 24 hr tablet Take 1 tablet (100 mg total) by mouth daily. Take with or immediately following a meal.   pantoprazole (PROTONIX) 40 MG tablet TAKE ONE TABLET BY MOUTH TWICE DAILY   potassium chloride SA (KLOR-CON M) 20 MEQ tablet Take 1 tablet (20 mEq total) by mouth daily.   Prenatal Vit-Fe Fumarate-FA (PRENATAL VITAMIN PLUS LOW IRON PO) Take 1 tablet by mouth in the morning.   tamoxifen (NOLVADEX) 20 MG tablet Take 1 tablet (20 mg total) by mouth every morning.   triamcinolone (KENALOG) 0.1 % Apply 1 application  topically 2  (two) times daily as needed (psoriasis). Mixed with Cetaphil 1:1   UNABLE TO FIND OUTPATIENT PHYSICAL THERAPY    Diagnosis: Generalized debility due to UTI, acute gastroenteritis   venlafaxine XR (EFFEXOR-XR) 37.5 MG 24 hr capsule Take 1 capsule (37.5 mg total) by mouth at bedtime.   venlafaxine XR (EFFEXOR-XR) 75 MG 24 hr capsule TAKE ONE CAPSULE BY MOUTH EVERY DAY   vitamin C (ASCORBIC ACID) 500 MG tablet Take 500 mg by mouth daily.     Allergies:   Lotensin [benazepril hcl], Morphine, Erythromycin base, Hydrocodone, Other, Banana, Chocolate, Gluten meal, Tizanidine, and Wound dressing adhesive   Social History   Socioeconomic History   Marital status: Married    Spouse name: Not on file   Number of children: 2   Years of education: Not on file   Highest education level: Some college,  no degree  Occupational History   Not on file  Tobacco Use   Smoking status: Never   Smokeless tobacco: Never  Vaping Use   Vaping status: Never Used  Substance and Sexual Activity   Alcohol use: Never   Drug use: Never   Sexual activity: Not Currently  Other Topics Concern   Not on file  Social History Narrative   Not on file   Social Drivers of Health   Financial Resource Strain: Low Risk  (12/01/2023)   Overall Financial Resource Strain (CARDIA)    Difficulty of Paying Living Expenses: Not very hard  Food Insecurity: No Food Insecurity (12/01/2023)   Hunger Vital Sign    Worried About Running Out of Food in the Last Year: Never true    Ran Out of Food in the Last Year: Never true  Transportation Needs: No Transportation Needs (12/01/2023)   PRAPARE - Administrator, Civil Service (Medical): No    Lack of Transportation (Non-Medical): No  Physical Activity: Inactive (12/01/2023)   Exercise Vital Sign    Days of Exercise per Week: 0 days    Minutes of Exercise per Session: 0 min  Stress: No Stress Concern Present (12/01/2023)   [employer] of Occupational Health -  Occupational Stress Questionnaire    Feeling of Stress : Only a little  Social Connections: Moderately Isolated (12/01/2023)   Social Connection and Isolation Panel [NHANES]    Frequency of Communication with Friends and Family: Three times a week    Frequency of Social Gatherings with Friends and Family: Once a week    Attends Religious Services: Never    Database administrator or Organizations: No    Attends Engineer, structural: Never    Marital Status: Married     Family History: The patient's family history includes Arthritis in her maternal aunt and mother; Atrial fibrillation in her cousin and mother; Cancer in her father, maternal uncle, mother, paternal uncle, and paternal uncle; Colon cancer in her cousin and paternal uncle; Colon cancer (age of onset: 72) in her father; Diabetes in her maternal aunt, maternal aunt, maternal aunt, maternal uncle, mother, paternal aunt, paternal uncle, paternal uncle, paternal uncle, paternal uncle, and sister; Early death in her father; Hearing loss in her maternal aunt, maternal uncle, paternal aunt, paternal uncle, and paternal uncle; Heart attack (age of onset: 41) in her father; Heart attack (age of onset: 29) in her paternal grandmother; Heart attack (age of onset: 66) in her mother; Heart disease in her father, maternal aunt, mother, paternal uncle, paternal uncle, and paternal uncle; Hyperlipidemia in her sister; Hypertension in her father, mother, paternal uncle, and sister; Obesity in her paternal uncle and paternal uncle; Stroke in her maternal grandmother, maternal uncle, and paternal grandfather.  ROS:   Review of Systems  Constitution: Negative for decreased appetite, fever and weight gain.  HENT: Negative for congestion, ear discharge, hoarse voice and sore throat.   Eyes: Negative for discharge, redness, vision loss in right eye and visual halos.  Cardiovascular: Negative for chest pain, dyspnea on exertion, leg swelling,  orthopnea and palpitations.  Respiratory: Negative for cough, hemoptysis, shortness of breath and snoring.   Endocrine: Negative for heat intolerance and polyphagia.  Hematologic/Lymphatic: Negative for bleeding problem. Does not bruise/bleed easily.  Skin: Negative for flushing, nail changes, rash and suspicious lesions.  Musculoskeletal: Negative for arthritis, joint pain, muscle cramps, myalgias, neck pain and stiffness.  Gastrointestinal: Negative for abdominal pain, bowel incontinence, diarrhea  and excessive appetite.  Genitourinary: Negative for decreased libido, genital sores and incomplete emptying.  Neurological: Negative for brief paralysis, focal weakness, headaches and loss of balance.  Psychiatric/Behavioral: Negative for altered mental status, depression and suicidal ideas.  Allergic/Immunologic: Negative for HIV exposure and persistent infections.    EKGs/Labs/Other Studies Reviewed:    The following studies were reviewed today:   EKG: None today   Recent Labs: 09/22/2023: ALT 36; BUN 19; Creatinine 0.80; Hemoglobin 11.6; Platelet Count 168; Potassium 3.7; Sodium 139  Recent Lipid Panel No results found for: "CHOL", "TRIG", "HDL", "CHOLHDL", "VLDL", "LDLCALC", "LDLDIRECT"  Physical Exam:    VS:  BP 136/80 (BP Location: Right Arm, Patient Position: Sitting, Cuff Size: Large)   Pulse 76   Ht 5\' 1"  (1.549 m)   Wt 209 lb 12.8 oz (95.2 kg)   SpO2 96%   BMI 39.64 kg/m     Wt Readings from Last 3 Encounters:  02/10/24 209 lb 12.8 oz (95.2 kg)  01/18/24 209 lb 3.2 oz (94.9 kg)  12/01/23 205 lb (93 kg)     GEN: Well nourished, well developed in no acute distress HEENT: Normal NECK: No JVD; No carotid bruits LYMPHATICS: No lymphadenopathy CARDIAC: S1S2 noted,RRR, no murmurs, rubs, gallops RESPIRATORY:  Clear to auscultation without rales, wheezing or rhonchi  ABDOMEN: Soft, non-tender, non-distended, +bowel sounds, no guarding. EXTREMITIES: No edema, No cyanosis,  no clubbing MUSCULOSKELETAL:  No deformity  SKIN: Warm and dry NEUROLOGIC:  Alert and oriented x 3, non-focal PSYCHIATRIC:  Normal affect, good insight  ASSESSMENT:    1. History of angina   2. Medication management   3. Mixed hyperlipidemia   4. DOE (dyspnea on exertion)   5. Pre-procedure lab exam   6. CAD in native artery    PLAN:    Chest pain concerning for Angina Intermittent chest tightness and dyspnea on exertion suggestive of angina. High risk due to diabetes, hyperlipidemia, and family history of coronary artery disease. Atypical presentation due to diabetes and female sex. Coronary artery calcification noted on CT scan. - Order right and left heart catheterization. Explained procedure, risks, and post-procedure requirements. Informed Consent   Shared Decision Making/Informed Consent The risks [stroke (1 in 1000), death (1 in 1000), kidney failure [usually temporary] (1 in 500), bleeding (1 in 200), allergic reaction [possibly serious] (1 in 200)], benefits (diagnostic support and management of coronary artery disease) and alternatives of a cardiac catheterization were discussed in detail with Kayla Bennett and she is willing to proceed.     - Start Imdur. - Order echocardiogram. - Order blood work for lipids, renal function, and CBC. - Refer to pharmacy clinic for Repatha authorization and monitoring.   Coronary artery calcification noted on CT scan. High risk due to diabetes, hyperlipidemia, and family history. Repatha recommended for hyperlipidemia management. - Restart Repatha injections and monitor for adverse effects. - - Monitor hemoglobin levels due to Eliquis use.  Atrial Fibrillation Persistent atrial fibrillation managed with Eliquis.  Hyperlipidemia Hyperlipidemia with previous statin intolerance. Dietary modification helped but not normalized levels. Repatha considered as alternative. - Order lipid panel. - Restart Repatha injections.  Diabetes  [place] Well-controlled diabetes contributing to cardiovascular risk.  Hypertension Hypertension managed with losartan and HCTZ, contributing to cardiovascular risk.  Sleep Apnea Sleep apnea managed with BiPAP. No pulmonary specialist involved. - Refer to pulmonary specialist for evaluation  The patient is in agreement with the above plan. The patient left the office in stable condition.  The patient will follow  up in   Medication Adjustments/Labs and Tests Ordered: Current medicines are reviewed at length with the patient today.  Concerns regarding medicines are outlined above.  Orders Placed This Encounter  Procedures   Comprehensive Metabolic Panel (CMET)   Magnesium   CBC   Lipid panel   Lipoprotein A (LPA)   AMB Referral to [facility] Pharm-[facility]   ECHOCARDIOGRAM COMPLETE   LEFT AND RIGHT HEART CATHETERIZATION WITH CORONARY ANGIOGRAM   Meds ordered this encounter  Medications   isosorbide mononitrate (IMDUR) 30 MG 24 hr tablet    Sig: Take 1 tablet (30 mg total) by mouth daily.    Dispense:  90 tablet    Refill:  3   Evolocumab (REPATHA SURECLICK) 140 MG/ML SOAJ    Sig: Inject 140 mg into the skin every 14 (fourteen) days.    Dispense:  2 mL    Refill:  2    Patient Instructions  Medication Instructions:  Your physician has recommended you make the following change in your medication:  START: Imdur 30 mg once daily *If you need a refill on your cardiac medications before your next appointment, please call your pharmacy*   Lab Work: CMET, Mag, CBC, Lipids, Lp(a)  If you have labs (blood work) drawn today and your tests are completely normal, you will receive your results only by: MyChart Message (if you have MyChart) OR A paper copy in the mail If you have any lab test that is abnormal or we need to change your treatment, we will call you to review the results.   Testing/Procedures: Your physician has requested that you have an echocardiogram. Echocardiography is  a painless test that uses sound waves to create images of your heart. It provides your doctor with information about the size and shape of your heart and how well your heart's chambers and valves are working. This procedure takes approximately one hour. There are no restrictions for this procedure. Please do NOT wear cologne, perfume, aftershave, or lotions (deodorant is allowed). Please arrive 15 minutes prior to your appointment time.  Please note: We ask at that you not bring children with you during ultrasound (echo/ vascular) testing. Due to room size and safety concerns, children are not allowed in the ultrasound rooms during exams. Our front office staff cannot provide observation of children in our lobby area while testing is being conducted. An adult accompanying a patient to their appointment will only be allowed in the ultrasound room at the discretion of the ultrasound technician under special circumstances. We apologize for any inconvenience.   And     Kayla Bennett  02/10/2024  You are scheduled for a Cardiac Catheterization on Thursday, April 3 with Dr. Verne Carrow.  1. Please arrive at the [facility] (Main Entrance A) at [facility]: [address] [place], [place] [address] at 7:00 AM (This time is 2 hour(s) before your procedure to ensure your preparation).   Free valet parking service is available. You will check in at ADMITTING. The support person will be asked to wait in the waiting room.  It is OK to have someone drop you off and come back when you are ready to be discharged.    Special note: Every effort is made to have your procedure done on time. Please understand that emergencies sometimes delay scheduled procedures.  2. Diet: Do not eat solid foods after midnight.  The patient may have clear liquids until 5am upon the day of the procedure.  3. Labs:  You will need to have blood drawn on labs will be done todayYou do not need to be fasting.  4.  Medication instructions in preparation for your procedure:   Contrast Allergy: No   *For reference purposes while preparing patient instructions.   Delete this med list prior to printing instructions for patient.*  Stop taking Eliquis (Apixiban) on Tuesday, April 1. And April 2   Stop taking, HTCZ (Hydrochlorothiazide) Wednesday, April 2,    Do not take Diabetes Med Glucophage (Metformin) on the day of the procedure and HOLD 48 HOURS AFTER THE PROCEDURE.  On the morning of your procedure, take your Aspirin 81 mg and any morning medicines NOT listed above.  You may use sips of water.  5. Plan to go home the same day, you will only stay overnight if medically necessary. 6. Bring a current list of your medications and current insurance cards. 7. You MUST have a responsible person to drive you home. 8. Someone MUST be with you the first 24 hours after you arrive home or your discharge will be delayed. 9. Please wear clothes that are easy to get on and off and wear slip-on shoes.  Thank you for allowing [place] to care for you!   -- [facility]     Follow-Up: At [facility], you and your health needs are our priority.  As part of our continuing mission to provide you with exceptional heart care, we have created designated Provider Care Teams.  These Care Teams include your primary Cardiologist (physician) and Advanced Practice Providers (APPs -  Physician Assistants and Nurse Practitioners) who all work together to provide you with the care you need, when you need it.  Your next appointment:   2-4 week(s)  Provider:   Thomasene Ripple, DO    Adopting a Healthy Lifestyle.  Know what a healthy weight is for you (roughly BMI <25) and aim to maintain this   Aim for 7+ servings of fruits and vegetables daily   65-80+ fluid ounces of water or unsweet tea for healthy kidneys   Limit to max 1 drink of alcohol per day; avoid smoking/tobacco   Limit  animal fats in diet for cholesterol and heart health - choose grass fed whenever available   Avoid highly processed foods, and foods high in saturated/trans fats   Aim for low stress - take time to unwind and care for your mental health   Aim for 150 min of moderate intensity exercise weekly for heart health, and weights twice weekly for bone health   Aim for 7-9 hours of sleep daily   When it comes to diets, agreement about the perfect plan isnt easy to find, even among the experts. Experts at the [facility] of [employer] developed an idea known as the Healthy Eating Plate. Just imagine a plate divided into logical, healthy portions.   The emphasis is on diet quality:   Load up on vegetables and fruits - one-half of your plate: Aim for color and variety, and remember that potatoes dont count.   Go for whole grains - one-quarter of your plate: Whole wheat, barley, wheat berries, quinoa, oats, brown rice, and foods made with them. If you want pasta, go with whole wheat pasta.   Protein power - one-quarter of your plate: Fish, chicken, beans, and nuts are all healthy, versatile protein sources. Limit red meat.   The diet, however, does go beyond the plate, offering a few other suggestions.  Use healthy plant oils, such as olive, canola, soy, corn, sunflower and peanut. Check the labels, and avoid partially hydrogenated oil, which have unhealthy trans fats.   If youre thirsty, drink water. Coffee and tea are good in moderation, but skip sugary drinks and limit milk and dairy products to one or two daily servings.   The type of carbohydrate in the diet is more important than the amount. Some sources of carbohydrates, such as vegetables, fruits, whole grains, and beans-are healthier than others.   Finally, stay active  Signed, Thomasene Ripple, DO  02/10/2024 4:00 PM    [facility]

## 2024-02-11 LAB — CBC
Hematocrit: 32.9 % — ABNORMAL LOW (ref 34.0–46.6)
Hemoglobin: 10.7 g/dL — ABNORMAL LOW (ref 11.1–15.9)
MCH: 30.4 pg (ref 26.6–33.0)
MCHC: 32.5 g/dL (ref 31.5–35.7)
MCV: 94 fL (ref 79–97)
Platelets: 173 10*3/uL (ref 150–450)
RBC: 3.52 x10E6/uL — ABNORMAL LOW (ref 3.77–5.28)
RDW: 13 % (ref 11.7–15.4)
WBC: 7.8 10*3/uL (ref 3.4–10.8)

## 2024-02-11 LAB — LIPID PANEL
Chol/HDL Ratio: 3.1 ratio (ref 0.0–4.4)
Cholesterol, Total: 198 mg/dL (ref 100–199)
HDL: 64 mg/dL (ref >39)
LDL Chol Calc (NIH): 109 mg/dL — ABNORMAL HIGH (ref 0–99)
Triglycerides: 142 mg/dL (ref 0–149)
VLDL Cholesterol Cal: 25 mg/dL (ref 5–40)

## 2024-02-11 LAB — COMPREHENSIVE METABOLIC PANEL WITH GFR
ALT: 34 IU/L — ABNORMAL HIGH (ref 0–32)
AST: 63 IU/L — ABNORMAL HIGH (ref 0–40)
Albumin: 4.1 g/dL (ref 3.8–4.8)
Alkaline Phosphatase: 87 IU/L (ref 44–121)
BUN/Creatinine Ratio: 13 (ref 12–28)
BUN: 10 mg/dL (ref 8–27)
Bilirubin Total: 0.3 mg/dL (ref 0.0–1.2)
CO2: 24 mmol/L (ref 20–29)
Calcium: 8.9 mg/dL (ref 8.7–10.3)
Chloride: 98 mmol/L (ref 96–106)
Creatinine, Ser: 0.75 mg/dL (ref 0.57–1.00)
Globulin, Total: 2.6 g/dL (ref 1.5–4.5)
Glucose: 165 mg/dL — ABNORMAL HIGH (ref 70–99)
Potassium: 4 mmol/L (ref 3.5–5.2)
Sodium: 138 mmol/L (ref 134–144)
Total Protein: 6.7 g/dL (ref 6.0–8.5)
eGFR: 85 mL/min/{1.73_m2} (ref >59)

## 2024-02-11 LAB — MAGNESIUM: Magnesium: 1.6 mg/dL (ref 1.6–2.3)

## 2024-02-11 LAB — LIPOPROTEIN A (LPA)

## 2024-02-12 ENCOUNTER — Other Ambulatory Visit: Payer: Self-pay | Admitting: Cardiology

## 2024-02-16 ENCOUNTER — Telehealth: Payer: Self-pay | Admitting: *Deleted

## 2024-02-16 ENCOUNTER — Other Ambulatory Visit (HOSPITAL_COMMUNITY): Payer: Self-pay

## 2024-02-16 ENCOUNTER — Telehealth: Payer: Self-pay

## 2024-02-16 NOTE — Telephone Encounter (Signed)
 Cardiac Catheterization scheduled at [id number] February 18, 2024 9 AM Arrival time [facility] Main Entrance A at: 7 AM  Nothing to eat after midnight prior to procedure, clear liquids until 5 AM day of procedure.  Medication instructions: -Hold:  Eliquis-none 02/16/24 until post procedure  Metformin-day of procedure and 48 hours post procedure  Losartan/HCT/KCl-AM of procedure -Other usual morning medications can be taken with sips of water including aspirin 81 mg.  Plan to go home the same day, you will only stay overnight if medically necessary.  You must have responsible adult to drive you home.  Someone must be with you the first 24 hours after you arrive home.  Reviewed procedure instructions with patient.

## 2024-02-16 NOTE — Telephone Encounter (Signed)
 Pharmacy Patient Advocate Encounter   Received notification from CoverMyMeds that prior authorization for REPATHA is required/requested.   Insurance verification completed.   The patient is insured through [facility] .   Per test claim: PA required; PA submitted to above mentioned insurance via CoverMyMeds Key/confirmation #/EOC [id number] Status is pending

## 2024-02-16 NOTE — Telephone Encounter (Signed)
 Pharmacy Patient Advocate Encounter  Received notification from [employer] [facility] that Prior Authorization for REPATHA has been APPROVED from 11/18/23 to 02/15/25

## 2024-02-18 ENCOUNTER — Telehealth: Payer: Self-pay | Admitting: *Deleted

## 2024-02-18 ENCOUNTER — Encounter (HOSPITAL_COMMUNITY)
Admission: RE | Disposition: A | Discharge status: Home / Self Care | Payer: Self-pay | Source: Home / Self Care | Attending: Cardiovascular Disease

## 2024-02-18 ENCOUNTER — Ambulatory Visit (HOSPITAL_COMMUNITY)
Admission: RE | Admit: 2024-02-18 | Discharge: 2024-02-18 | Disposition: A | Discharge status: Home / Self Care | Attending: Cardiovascular Disease | Admitting: Cardiovascular Disease

## 2024-02-18 ENCOUNTER — Encounter: Payer: Self-pay | Admitting: Cardiology

## 2024-02-18 ENCOUNTER — Other Ambulatory Visit: Payer: Self-pay

## 2024-02-18 ENCOUNTER — Encounter (HOSPITAL_COMMUNITY): Payer: Self-pay | Admitting: Cardiovascular Disease

## 2024-02-18 DIAGNOSIS — I4819 Other persistent atrial fibrillation: Secondary | ICD-10-CM | POA: Diagnosis not present

## 2024-02-18 DIAGNOSIS — I10 Essential (primary) hypertension: Secondary | ICD-10-CM | POA: Diagnosis not present

## 2024-02-18 DIAGNOSIS — Z7901 Long term (current) use of anticoagulants: Secondary | ICD-10-CM | POA: Diagnosis not present

## 2024-02-18 DIAGNOSIS — R0609 Other forms of dyspnea: Secondary | ICD-10-CM | POA: Insufficient documentation

## 2024-02-18 DIAGNOSIS — I25119 Atherosclerotic heart disease of native coronary artery with unspecified angina pectoris: Secondary | ICD-10-CM | POA: Insufficient documentation

## 2024-02-18 DIAGNOSIS — E782 Mixed hyperlipidemia: Secondary | ICD-10-CM | POA: Diagnosis not present

## 2024-02-18 DIAGNOSIS — G4730 Sleep apnea, unspecified: Secondary | ICD-10-CM | POA: Insufficient documentation

## 2024-02-18 DIAGNOSIS — E119 Type 2 diabetes mellitus without complications: Secondary | ICD-10-CM | POA: Insufficient documentation

## 2024-02-18 DIAGNOSIS — I2510 Atherosclerotic heart disease of native coronary artery without angina pectoris: Secondary | ICD-10-CM | POA: Diagnosis not present

## 2024-02-18 DIAGNOSIS — Z79899 Other long term (current) drug therapy: Secondary | ICD-10-CM | POA: Insufficient documentation

## 2024-02-18 DIAGNOSIS — Z853 Personal history of malignant neoplasm of breast: Secondary | ICD-10-CM | POA: Diagnosis not present

## 2024-02-18 DIAGNOSIS — Z7984 Long term (current) use of oral hypoglycemic drugs: Secondary | ICD-10-CM | POA: Insufficient documentation

## 2024-02-18 HISTORY — PX: RIGHT/LEFT HEART CATH AND CORONARY ANGIOGRAPHY: CATH118266

## 2024-02-18 LAB — POCT I-STAT 7, (LYTES, BLD GAS, ICA,H+H)
Acid-Base Excess: 2 mmol/L (ref 0.0–2.0)
Bicarbonate: 27.2 mmol/L (ref 20.0–28.0)
Calcium, Ion: 1.16 mmol/L (ref 1.15–1.40)
HCT: 30 % — ABNORMAL LOW (ref 36.0–46.0)
Hemoglobin: 10.2 g/dL — ABNORMAL LOW (ref 12.0–15.0)
O2 Saturation: 99 %
Potassium: 3.9 mmol/L (ref 3.5–5.1)
Sodium: 141 mmol/L (ref 135–145)
TCO2: 29 mmol/L (ref 22–32)
pCO2 arterial: 44.3 mmHg (ref 32–48)
pH, Arterial: 7.397 (ref 7.35–7.45)
pO2, Arterial: 145 mmHg — ABNORMAL HIGH (ref 83–108)

## 2024-02-18 LAB — GLUCOSE, CAPILLARY
Glucose-Capillary: 135 mg/dL — ABNORMAL HIGH (ref 70–99)
Glucose-Capillary: 104 mg/dL — ABNORMAL HIGH (ref 70–99)

## 2024-02-18 LAB — POCT ACTIVATED CLOTTING TIME
Activated Clotting Time: 204 s
Activated Clotting Time: 175 s

## 2024-02-18 SURGERY — RIGHT/LEFT HEART CATH AND CORONARY ANGIOGRAPHY
Anesthesia: LOCAL

## 2024-02-18 MED ORDER — FUROSEMIDE 40 MG PO TABS: 40.0000 mg | ORAL_TABLET | Freq: Every day | ORAL | 1 refills | Status: DC

## 2024-02-18 MED ORDER — HEPARIN SODIUM (PORCINE) 1000 UNIT/ML IJ SOLN
INTRAMUSCULAR | Status: AC
  Filled 2024-02-18: qty 10

## 2024-02-18 MED ORDER — VERAPAMIL HCL 2.5 MG/ML IV SOLN
INTRAVENOUS | Status: DC | PRN
  Administered 2024-02-18: 10 mL via INTRA_ARTERIAL

## 2024-02-18 MED ORDER — ASPIRIN 81 MG PO CHEW
81.0000 mg | CHEWABLE_TABLET | ORAL | Status: AC
  Administered 2024-02-18: 81 mg via ORAL
  Filled 2024-02-18: qty 1

## 2024-02-18 MED ORDER — HEPARIN SODIUM (PORCINE) 1000 UNIT/ML IJ SOLN
INTRAMUSCULAR | Status: DC | PRN
  Administered 2024-02-18: 5000 [IU] via INTRAVENOUS

## 2024-02-18 MED ORDER — FENTANYL CITRATE (PF) 100 MCG/2ML IJ SOLN
INTRAMUSCULAR | Status: AC
  Filled 2024-02-18: qty 2

## 2024-02-18 MED ORDER — HYDRALAZINE HCL 20 MG/ML IJ SOLN: 10.0000 mg | INTRAMUSCULAR | Status: DC | PRN

## 2024-02-18 MED ORDER — MIDAZOLAM HCL 2 MG/2ML IJ SOLN
INTRAMUSCULAR | Status: AC
  Filled 2024-02-18: qty 2

## 2024-02-18 MED ORDER — ONDANSETRON HCL 4 MG/2ML IJ SOLN: 4.0000 mg | Freq: Four times a day (QID) | INTRAMUSCULAR | Status: DC | PRN

## 2024-02-18 MED ORDER — LIDOCAINE HCL (PF) 1 % IJ SOLN
INTRAMUSCULAR | Status: AC
  Filled 2024-02-18: qty 30

## 2024-02-18 MED ORDER — SODIUM CHLORIDE 0.9 % WEIGHT BASED INFUSION: 1.0000 mL/kg/h | INTRAVENOUS | Status: DC

## 2024-02-18 MED ORDER — VERAPAMIL HCL 2.5 MG/ML IV SOLN
INTRAVENOUS | Status: AC
  Filled 2024-02-18: qty 2

## 2024-02-18 MED ORDER — FENTANYL CITRATE (PF) 100 MCG/2ML IJ SOLN
INTRAMUSCULAR | Status: DC | PRN
  Administered 2024-02-18: 25 ug via INTRAVENOUS

## 2024-02-18 MED ORDER — LIDOCAINE HCL (PF) 1 % IJ SOLN
INTRAMUSCULAR | Status: DC | PRN
  Administered 2024-02-18 (×2): 2 mL via INTRADERMAL
  Administered 2024-02-18: 15 mL via INTRADERMAL

## 2024-02-18 MED ORDER — IOHEXOL 350 MG/ML SOLN
INTRAVENOUS | Status: DC | PRN
  Administered 2024-02-18: 40 mL

## 2024-02-18 MED ORDER — SODIUM CHLORIDE 0.9 % WEIGHT BASED INFUSION: 3.0000 mL/kg/h | INTRAVENOUS | Status: AC

## 2024-02-18 MED ORDER — LOSARTAN POTASSIUM 100 MG PO TABS: 100.0000 mg | ORAL_TABLET | Freq: Every day | ORAL | 1 refills | Status: DC

## 2024-02-18 MED ORDER — ACETAMINOPHEN 325 MG PO TABS: 650.0000 mg | ORAL_TABLET | ORAL | Status: DC | PRN

## 2024-02-18 MED ORDER — LABETALOL HCL 5 MG/ML IV SOLN: 10.0000 mg | INTRAVENOUS | Status: DC | PRN

## 2024-02-18 MED ORDER — MIDAZOLAM HCL 2 MG/2ML IJ SOLN
INTRAMUSCULAR | Status: DC | PRN
  Administered 2024-02-18: 1 mg via INTRAVENOUS

## 2024-02-18 SURGICAL SUPPLY — 11 items
CATH 5FR JL3.5 JR4 ANG PIG MP (CATHETERS) IMPLANT
CATH BALLN WEDGE 5F 110CM (CATHETERS) IMPLANT
DEVICE RAD COMP TR BAND LRG (VASCULAR PRODUCTS) IMPLANT
GLIDESHEATH SLEND SS 6F .021 (SHEATH) IMPLANT
GUIDEWIRE .025 260CM (WIRE) IMPLANT
GUIDEWIRE INQWIRE 1.5J.035X260 (WIRE) IMPLANT
INQWIRE 1.5J .035X260CM (WIRE) ×1 IMPLANT
PACK CARDIAC CATHETERIZATION (CUSTOM PROCEDURE TRAY) ×1 IMPLANT
SHEATH GLIDE SLENDER 4/5FR (SHEATH) IMPLANT
SHEATH PINNACLE 5F 10CM (SHEATH) IMPLANT
SHEATH PINNACLE 7F 10CM (SHEATH) IMPLANT

## 2024-02-18 NOTE — Telephone Encounter (Signed)
 This is a patient of Dr. Mallory Shirk that I just cathed. Dr. Servando Salina is not on secure chat today.  Could you help me change her outpatient meds? I would like to stop Losartan/hydrochlorothiazide and start Losartan 100 mg once daily with Lasix 40 mg daily. Thanks, chris   ______________________________ Discontinued losartan-hydrochlorothiazide.  Placed orders for losartan 100 mg daily and lasix 40 mg daily.  Message to patient via portal to make her aware medications sent per Dr. Clifton James.

## 2024-02-18 NOTE — Discharge Instructions (Addendum)
 Will stop Losartan/hydrochlorothiazide  Our office will call in a new prescription for: Losartan 100 mg daily (without the hydrochlorothiazide) and Lasix 40 mg daily.   Resume Eliquis tomorrowRadial Site Care Drink plenty of fluid for the next 3 days  Keep arm elevated for the next 24 hours The following information offers guidance on how to care for yourself after your procedure. Your health care provider may also give you more specific instructions. If you have problems or questions, contact your health care provider. What can I expect after the procedure? After the procedure, it is common to have bruising and tenderness in the incision area. Follow these instructions at home: Incision site care  Follow instructions from your health care provider about how to take care of your incision site. Make sure you:  Remove your dressing 24 hours after discharge.  Do not take baths, swim, or use a hot tub for 1 week. You may shower 24 hours after the procedure or as told by your health care provider. Remove the dressing and gently wash the incision area with plain soap and water. Pat the area dry with a clean towel. Do not rub the site. That could cause bleeding. Do not apply powder or lotion to the site. Check your incision site every day for signs of infection. Check for: Redness, swelling, or pain. Fluid or blood. Warmth. Pus or a bad smell. Activity For 24 hours after the procedure, or as directed by your health care provider: Do not flex or bend the affected arm. Do not push or pull heavy objects with the affected arm. Do not operate machinery or power tools. Do not drive. You should not drive yourself home from the hospital or clinic if you go home during that time period. You may drive 24 hours after the procedure unless your health care provider tells you not to. Do not lift anything that is heavier than 10 lb (4.5 kg), or the limit that you are told, until your health care  provider says that it is safe. Return to your normal activities as told by your health care provider. Ask your health care provider what activities are safe for you and when you can return to work. If you were given a sedative during the procedure, it can affect you for several hours. Do not drive or operate machinery until your health care provider says that it is safe. General instructions Take over-the-counter and prescription medicines only as told by your health care provider. If you will be going home right after the procedure, plan to have a responsible adult care for you for the time you are told. This is important. Keep all follow-up visits. This is important. Contact a health care provider if: You have a fever or chills. You have any of these signs of infection at your incision site: Redness, swelling, or pain. Fluid or blood. Warmth. Pus or a bad smell. Get help right away if: The incision area swells very fast. The incision area is bleeding, and the bleeding does not stop when you hold steady pressure on the area. Your arm or hand becomes pale, cool, tingly, or numb. These symptoms may represent a serious problem that is an emergency. Do not wait to see if the symptoms will go away. Get medical help right away. Call your local emergency services (911 in the [place] Do not drive yourself to the hospital. Summary After the procedure, it is common to have bruising and tenderness at the incision site. Follow instructions from  your health care provider about how to take care of your radial site incision. Check the incision every day for signs of infection. Do not lift anything that is heavier than 10 lb (4.5 kg), or the limit that you are told, until your health care provider says that it is safe. Get help right away if the incision area swells very fast, you have bleeding at the incision site that will not stop, or your arm or hand becomes pale, cool, or numb. This information is not  intended to replace advice given to you by your health care provider. Make sure you discuss any questions you have with your health care provider. Document Revised: 12/23/2020 Document Reviewed: 12/23/2020 Elsevier Patient Education  2023 [employer].

## 2024-02-18 NOTE — Interval H&P Note (Signed)
 History and Physical Interval Note:  02/18/2024 7:38 AM  Kayla Bennett  has presented today for surgery, with the diagnosis of CAD with Angina.  The various methods of treatment have been discussed with the patient and family. After consideration of risks, benefits and other options for treatment, the patient has consented to  Procedure(s): RIGHT/LEFT HEART CATH AND CORONARY ANGIOGRAPHY (N/A) as a surgical intervention.  The patient's history has been reviewed, patient examined, no change in status, stable for surgery.  I have reviewed the patient's chart and labs.  Questions were answered to the patient's satisfaction.    Cath Lab Visit (complete for each Cath Lab visit)  Clinical Evaluation Leading to the Procedure:   ACS: No.  Non-ACS:    Anginal Classification: CCS III  Anti-ischemic medical therapy: Maximal Therapy (2 or more classes of medications)  Non-Invasive Test Results: No non-invasive testing performed  Prior CABG: No previous CABG  Verne Carrow

## 2024-02-18 NOTE — Progress Notes (Signed)
Pt ambulated to and from bathroom to void with no signs of oozing from right groin site  

## 2024-02-18 NOTE — Progress Notes (Signed)
 Site area: Right groin a 6 french venous sheath was removed  Site Prior to Removal:  Level 0  Pressure Applied For 20 MINUTES    Bedrest Beginning at 1240p X 2 hours  Manual:   Yes.    Patient Status During Pull:  stable  Post Pull Groin Site:  Level 0  Post Pull Instructions Given:  Yes.    Post Pull Pulses Present:  Yes.    Dressing Applied:  Yes.    Comments:

## 2024-02-18 NOTE — Progress Notes (Signed)
TR BAND REMOVAL  LOCATION:    right radial  DEFLATED PER PROTOCOL:    Yes.    TIME BAND OFF / DRESSING APPLIED:    1345 gauze dressing applied    SITE UPON ARRIVAL:    Level 0  SITE AFTER BAND REMOVAL:    Level 0  CIRCULATION SENSATION AND MOVEMENT:    Within Normal Limits   Yes.    COMMENTS:   no issues noted  

## 2024-02-19 ENCOUNTER — Other Ambulatory Visit: Payer: Self-pay | Admitting: Family Medicine

## 2024-02-23 LAB — POCT I-STAT EG7
Acid-Base Excess: 3 mmol/L — ABNORMAL HIGH (ref 0.0–2.0)
Bicarbonate: 29.4 mmol/L — ABNORMAL HIGH (ref 20.0–28.0)
Calcium, Ion: 1.09 mmol/L — ABNORMAL LOW (ref 1.15–1.40)
HCT: 29 % — ABNORMAL LOW (ref 36.0–46.0)
Hemoglobin: 9.9 g/dL — ABNORMAL LOW (ref 12.0–15.0)
O2 Saturation: 63 %
Potassium: 3.7 mmol/L (ref 3.5–5.1)
Sodium: 142 mmol/L (ref 135–145)
TCO2: 31 mmol/L (ref 22–32)
pCO2, Ven: 54 mmHg (ref 44–60)
pH, Ven: 7.344 (ref 7.25–7.43)
pO2, Ven: 35 mmHg (ref 32–45)

## 2024-02-23 LAB — POCT ACTIVATED CLOTTING TIME: Activated Clotting Time: 205 s

## 2024-02-29 ENCOUNTER — Ambulatory Visit (HOSPITAL_BASED_OUTPATIENT_CLINIC_OR_DEPARTMENT_OTHER)
Admission: RE | Admit: 2024-02-29 | Discharge: 2024-02-29 | Disposition: A | Discharge status: Home / Self Care | Source: Ambulatory Visit | Attending: Cardiology | Admitting: Cardiology

## 2024-02-29 DIAGNOSIS — R0609 Other forms of dyspnea: Secondary | ICD-10-CM | POA: Diagnosis not present

## 2024-02-29 LAB — ECHOCARDIOGRAM COMPLETE
AR max vel: 1.79 cm2
AV Area VTI: 1.87 cm2
AV Area mean vel: 1.75 cm2
AV Mean grad: 4 mmHg
AV Peak grad: 7.6 mmHg
Ao pk vel: 1.38 m/s
Area-P 1/2: 2.79 cm2
Calc EF: 61.1 %
MV M vel: 4.04 m/s
MV Peak grad: 65.3 mmHg
S' Lateral: 2.6 cm
Single Plane A2C EF: 61.1 %
Single Plane A4C EF: 61.1 %

## 2024-03-03 ENCOUNTER — Encounter: Payer: Self-pay | Admitting: Cardiology

## 2024-03-03 ENCOUNTER — Ambulatory Visit: Attending: Cardiology | Admitting: Cardiology

## 2024-03-03 VITALS — BP 124/77 | HR 67 | Ht 61.0 in | Wt 208.0 lb

## 2024-03-03 DIAGNOSIS — I2510 Atherosclerotic heart disease of native coronary artery without angina pectoris: Secondary | ICD-10-CM | POA: Diagnosis not present

## 2024-03-03 DIAGNOSIS — I10 Essential (primary) hypertension: Secondary | ICD-10-CM | POA: Diagnosis not present

## 2024-03-03 DIAGNOSIS — Z79899 Other long term (current) drug therapy: Secondary | ICD-10-CM | POA: Insufficient documentation

## 2024-03-03 DIAGNOSIS — Z8709 Personal history of other diseases of the respiratory system: Secondary | ICD-10-CM | POA: Diagnosis not present

## 2024-03-03 DIAGNOSIS — E782 Mixed hyperlipidemia: Secondary | ICD-10-CM | POA: Diagnosis not present

## 2024-03-03 LAB — COMPREHENSIVE METABOLIC PANEL WITH GFR
ALT: 38 IU/L — ABNORMAL HIGH (ref 0–32)
AST: 88 IU/L — ABNORMAL HIGH (ref 0–40)
Albumin: 4.1 g/dL (ref 3.8–4.8)
Alkaline Phosphatase: 82 IU/L (ref 44–121)
BUN/Creatinine Ratio: 27 (ref 12–28)
BUN: 21 mg/dL (ref 8–27)
Bilirubin Total: 0.3 mg/dL (ref 0.0–1.2)
CO2: 25 mmol/L (ref 20–29)
Calcium: 9 mg/dL (ref 8.7–10.3)
Chloride: 100 mmol/L (ref 96–106)
Creatinine, Ser: 0.78 mg/dL (ref 0.57–1.00)
Globulin, Total: 3.1 g/dL (ref 1.5–4.5)
Glucose: 149 mg/dL — ABNORMAL HIGH (ref 70–99)
Potassium: 4.1 mmol/L (ref 3.5–5.2)
Sodium: 140 mmol/L (ref 134–144)
Total Protein: 7.2 g/dL (ref 6.0–8.5)
eGFR: 80 mL/min/{1.73_m2} (ref >59)

## 2024-03-03 LAB — MAGNESIUM: Magnesium: 1.8 mg/dL (ref 1.6–2.3)

## 2024-03-03 NOTE — Patient Instructions (Signed)
 Medication Instructions:  Your physician has recommended you make the following change in your medication:  STOP: Imdur *If you need a refill on your cardiac medications before your next appointment, please call your pharmacy*  Lab Work: CMET, Mag If you have labs (blood work) drawn today and your tests are completely normal, you will receive your results only by: MyChart Message (if you have MyChart) OR A paper copy in the mail If you have any lab test that is abnormal or we need to change your treatment, we will call you to review the results.  Testing/Procedures: Your physician has recommended that you have a pulmonary function test. Pulmonary Function Tests are a group of tests that measure how well air moves in and out of your lungs.  Pulmonary Function Tests Pulmonary function tests (PFTs) are breathing tests that are used to: Measure how well your lungs work. Find out what is causing your lung problems. Find the best treatment for you. You may have PFTs: If you have a condition that affects your lungs, such as asthma or chronic obstructive pulmonary disease (COPD). To watch for changes in your lung function over time if you have a long-term (chronic) lung disease. If you are an IT trainer. PFTs check the effects of being exposed to chemicals over a long period of time. To check lung function: Before having surgery or other procedures. If you smoke. To check if prescribed medicines or treatments are helping your lungs. Tell a health care provider about: Any allergies you have. All medicines you are taking, including inhaler or nebulizer medicines, vitamins, herbs, eye drops, creams, and over-the-counter medicines. Any bleeding problems you have. Any surgeries you have had, especially recent surgery of the eye, abdomen, or chest. These can make PFTs difficult or unsafe. Any medical conditions you have, including chest pain or heart problems, tuberculosis, or  respiratory infections such as pneumonia, a cold, or the flu. Any fear of being in closed spaces (claustrophobia). Some of your tests may be in a closed space. What are the risks? Your health care provider will talk with you about risks. These may include: Feeling light-headed due to fast, deep breathing known as overbreathing or hyperventilation. An asthma attack from deep breathing. What happens before the test? Take over-the-counter and prescription medicines only as told by your health care provider. If you take inhaler or nebulizer medicines, ask your health care provider which medicines you should take on the day of your testing. Some inhaler medicines may interfere with PFTs if they are taken shortly before the tests. Follow instructions from your health care provider about what you may eat and drink. These may include: Avoiding eating large meals. Avoiding using caffeine before the testing. Not drinkingalcohol for up to 4 hours before the test. Do not use any products that contain nicotine or tobacco for up to 4 hours before your test. These products include cigarettes, chewing tobacco, and vaping devices, such as e-cigarettes. These can affect your test results. If you need help quitting, ask your health care provider. Wear comfortable clothing that will not get in the way of your breathing. Avoid exercise that takes a lot of effort (strenuous exercise) for at least 30 minutes before the test. What happens during the test?  You will be given: A soft noseclip to wear. This allows all of your breaths to go through your mouth instead of your nose. A germ-free (sterile) mouthpiece. It will be attached to a spirometer machine that measures your breathing. You  will be asked to do breathing exercises. The exercises will be done by breathing in (inhaling) and breathing out (exhaling). You may have to repeat the exercises many times before the testing is complete. You will need to follow  instructions exactly as told to get accurate results. Make sure to blow as hard and as fast as you can when you are told to do so. You may be given a medicine called a bronchodilator. This makes the small air passages in your lungs larger so you can breathe easier. The tests will be repeated after the medicine takes effect. You will be watched for any problems, such as feeling faint or dizzy, or having trouble breathing. The procedure may vary among health care providers and hospitals. What can I expect after the test? Your results will be compared with the expected lung function of someone with healthy lungs who is similar to you in several ways. These ways include age, sex, height, weight, and race or ethnicity. This is done to show how your lung function compares with normal lung function (percent predicted). The percent predicted helps your health care provider know if your lung function is normal or not. If you have had PFTs done before, your health care provider will compare your current results with past results. This shows if your lung function is better, worse, or the same as before. It is up to you to get the results of your procedure. Ask your health care provider, or the department that is doing the procedure, when your results will be ready. After you get your results, talk with your health care provider about treatment options, if necessary. This information is not intended to replace advice given to you by your health care provider. Make sure you discuss any questions you have with your health care provider. Document Revised: 05/26/2022 Document Reviewed: 05/26/2022 Elsevier Patient Education  2024 [employer]  Follow-Up: At [facility], you and your health needs are our priority.  As part of our continuing mission to provide you with exceptional heart care, our providers are all part of one team.  This team includes your primary Cardiologist (physician) and Advanced Practice  Providers or APPs (Physician Assistants and Nurse Practitioners) who all work together to provide you with the care you need, when you need it.  Your next appointment:   9 month(s)  Provider:   Kardie Tobb, DO     Other Instructions:   1st Floor: - Lobby - Registration  - Pharmacy  - Lab - Cafe  2nd Floor: - PV Lab - Diagnostic Testing (echo, CT, nuclear med)  3rd Floor: - Vacant  4th Floor: - TCTS (cardiothoracic surgery) - AFib Clinic - Structural Heart Clinic - Vascular Surgery  - Vascular Ultrasound  5th Floor: - [facility] Cardiology (general and EP) - Clinical Pharmacy for coumadin, hypertension, lipid, weight-loss medications, and med management appointments    Valet parking services will be available as well.

## 2024-03-03 NOTE — Progress Notes (Signed)
 Cardiology Office Note:    Date:  03/03/2024   ID:  Kayla Bennett, DOB [birth year], MRN [id number]  PCP:  Everrett Coombe, DO  Cardiologist:  Thomasene Ripple, DO  Electrophysiologist:  Regan Lemming, MD   Referring MD: Everrett Coombe, DO   " I am still having chest tightness"  History of Present Illness:    Kayla Bennett is a 73 y.o. female with a hx of mild coronary artery disease seen on recent cardiac catheterization, with a concerns of volume overload, her echocardiogram did show diastolic dysfunction as well.  She has since been started on Lasix.  She tells me despite the Lasix she is experiencing worsening chest tightness mostly on exertion.  She is concerned about this.  She was on Imdur but this is did not help and has worsening her daily headache, paroxysmal atrial fibrillation status post ablation with recent ECG showing sinus bradycardia, hyperlipidemia, sleep apnea and diabetes.  Here today for follow-up visit.  She has some shortness of breath which has not also improved as well on the Lasix.  Past Medical History:  Diagnosis Date   Allergy 1978   Food allergies, seasonal allergies, medicine allergies   Anemia    Arthritis    Asthma    Blood transfusion without reported diagnosis 1985   Breast cancer (HCC)    Diabetes (HCC)    Dyspnea    Dysrhythmia    afibb on occasion   Family history of adverse reaction to anesthesia    mother had reaction to versed...during surgery, stopped breathing   Fibromyalgia    GERD (gastroesophageal reflux disease)    Headache    History of hiatal hernia    Hyperlipidemia 1990?   Hypertension    Migraines    Osteopenia    Osteoporosis    Oxygen deficiency 2023   Psoriasis    Sleep apnea 2023   Yeast infection 10/30/2021    Past Surgical History:  Procedure Laterality Date   ABDOMINAL HYSTERECTOMY     APPENDECTOMY     ATRIAL FIBRILLATION ABLATION N/A 12/10/2021   Procedure: ATRIAL FIBRILLATION ABLATION;  Surgeon:  Regan Lemming, MD;  Location: [facility];  Service: Cardiovascular;  Laterality: N/A;   BREAST LUMPECTOMY WITH RADIOACTIVE SEED AND SENTINEL LYMPH NODE BIOPSY Right 02/18/2021   Procedure: RIGHT BREAST LUMPECTOMY WITH RADIOACTIVE SEED AND SENTINEL LYMPH NODE BIOPSY;  Surgeon: Almond Lint, MD;  Location: [facility] OR;  Service: General;  Laterality: Right;   BREAST SURGERY  2022   broken wrist     CARDIOVERSION N/A 04/04/2021   Procedure: CARDIOVERSION;  Surgeon: Quintella Reichert, MD;  Location: [facility] ENDOSCOPY;  Service: Cardiovascular;  Laterality: N/A;   CARDIOVERSION N/A 08/14/2021   Procedure: CARDIOVERSION;  Surgeon: Sande Rives, MD;  Location: [facility] ENDOSCOPY;  Service: Cardiovascular;  Laterality: N/A;   CARDIOVERSION N/A 09/19/2021   Procedure: CARDIOVERSION;  Surgeon: Sande Rives, MD;  Location: [facility] ENDOSCOPY;  Service: Cardiovascular;  Laterality: N/A;   CESAREAN SECTION  1976 and 1977   CHOLECYSTECTOMY     FRACTURE SURGERY     right wrist   HYSTERECTOMY ABDOMINAL WITH SALPINGO-OOPHORECTOMY     LYSIS OF ADHESION     RIGHT/LEFT HEART CATH AND CORONARY ANGIOGRAPHY N/A 02/18/2024   Procedure: RIGHT/LEFT HEART CATH AND CORONARY ANGIOGRAPHY;  Surgeon: Kathleene Hazel, MD;  Location: [facility];  Service: Cardiovascular;  Laterality: N/A;   SHOULDER SURGERY Left    TUBAL LIGATION  Current Medications: Current Meds  Medication Sig   acetaminophen (TYLENOL) 500 MG tablet Take 500 mg by mouth every 6 (six) hours as needed for moderate pain, headache or mild pain.   albuterol (VENTOLIN HFA) 108 (90 Base) MCG/ACT inhaler Inhale 2 puffs into the lungs every 6 (six) hours as needed for wheezing or shortness of breath.   apixaban (ELIQUIS) 5 MG TABS tablet TAKE ONE TABLET BY MOUTH TWICE DAILY   calcipotriene (DOVONOX) 0.005 % cream Apply 1 application  topically See admin instructions. Mix with fluorouracil 5% cream and apply topically twice daily for  five days - as needed for precancerous on right cheek.   Calcium Carbonate (CALCIUM 600 PO) Take 600 mg by mouth daily.   cetirizine (ZYRTEC) 10 MG tablet Take 10 mg by mouth in the morning.   cholecalciferol (VITAMIN D3) 25 MCG (1000 UNIT) tablet Take 1,000 Units by mouth in the morning and at bedtime.   Evolocumab (REPATHA SURECLICK) 140 MG/ML SOAJ Inject 140 mg into the skin every 14 (fourteen) days.   ferrous sulfate 325 (65 FE) MG tablet Take 325 mg by mouth every morning.   fluorouracil (EFUDEX) 5 % cream Apply 1 application  topically 2 (two) times daily as needed (precancerous spots on right temple and left eyebrow). Mix with calcipotriene 0.005% cream and apply topically twice daily for five days - as needed for precancerous on right temple and left eyebrow   furosemide (LASIX) 40 MG tablet Take 1 tablet (40 mg total) by mouth daily.   gabapentin (NEURONTIN) 100 MG capsule TAKE THREE CAPSULES BY MOUTH AT BEDTIME (Patient taking differently: Take 200 mg by mouth at bedtime.)   hydrocortisone 2.5 % cream Apply 1 Application topically 2 (two) times daily.   ibuprofen (ADVIL) 200 MG tablet Take 200 mg by mouth every 6 (six) hours as needed for mild pain (pain score 1-3).   losartan (COZAAR) 100 MG tablet Take 1 tablet (100 mg total) by mouth daily.   metFORMIN (GLUCOPHAGE-XR) 500 MG 24 hr tablet TAKE ONE TABLET BY MOUTH EVERY DAY (Patient taking differently: Take 500 mg by mouth at bedtime.)   metoprolol succinate (TOPROL-XL) 100 MG 24 hr tablet Take 1 tablet (100 mg total) by mouth daily. Take with or immediately following a meal.   pantoprazole (PROTONIX) 40 MG tablet TAKE ONE TABLET BY MOUTH TWICE DAILY   potassium chloride SA (KLOR-CON M) 20 MEQ tablet Take 1 tablet (20 mEq total) by mouth daily.   Prenatal Vit-Fe Fumarate-FA (PRENATAL VITAMIN PLUS LOW IRON PO) Take 1 tablet by mouth in the morning.   tamoxifen (NOLVADEX) 20 MG tablet Take 1 tablet (20 mg total) by mouth every morning.    triamcinolone (KENALOG) 0.1 % Apply 1 application  topically 2 (two) times daily as needed (psoriasis). Mixed with Cetaphil 1:1   UNABLE TO FIND OUTPATIENT PHYSICAL THERAPY    Diagnosis: Generalized debility due to UTI, acute gastroenteritis   venlafaxine XR (EFFEXOR-XR) 37.5 MG 24 hr capsule Take 1 capsule (37.5 mg total) by mouth at bedtime.   venlafaxine XR (EFFEXOR-XR) 75 MG 24 hr capsule TAKE ONE CAPSULE BY MOUTH EVERY DAY   vitamin C (ASCORBIC ACID) 500 MG tablet Take 500 mg by mouth daily.   [DISCONTINUED] isosorbide mononitrate (IMDUR) 30 MG 24 hr tablet Take 1 tablet (30 mg total) by mouth daily.     Allergies:   Lotensin [benazepril hcl], Morphine, Erythromycin base, Hydrocodone, Other, Banana, Chocolate, Gluten meal, Tizanidine, and Wound dressing adhesive  Social History   Socioeconomic History   Marital status: Married    Spouse name: Not on file   Number of children: 2   Years of education: Not on file   Highest education level: Some college, no degree  Occupational History   Not on file  Tobacco Use   Smoking status: Never   Smokeless tobacco: Never  Vaping Use   Vaping status: Never Used  Substance and Sexual Activity   Alcohol use: Never   Drug use: Never   Sexual activity: Not Currently  Other Topics Concern   Not on file  Social History Narrative   Not on file   Social Drivers of Health   Financial Resource Strain: Low Risk  (12/01/2023)   Overall Financial Resource Strain (CARDIA)    Difficulty of Paying Living Expenses: Not very hard  Food Insecurity: No Food Insecurity (12/01/2023)   Hunger Vital Sign    Worried About Running Out of Food in the Last Year: Never true    Ran Out of Food in the Last Year: Never true  Transportation Needs: No Transportation Needs (12/01/2023)   PRAPARE - Administrator, Civil Service (Medical): No    Lack of Transportation (Non-Medical): No  Physical Activity: Inactive (12/01/2023)   Exercise Vital Sign     Days of Exercise per Week: 0 days    Minutes of Exercise per Session: 0 min  Stress: No Stress Concern Present (12/01/2023)   [employer] of Occupational Health - Occupational Stress Questionnaire    Feeling of Stress : Only a little  Social Connections: Moderately Isolated (12/01/2023)   Social Connection and Isolation Panel [NHANES]    Frequency of Communication with Friends and Family: Three times a week    Frequency of Social Gatherings with Friends and Family: Once a week    Attends Religious Services: Never    Database administrator or Organizations: No    Attends Engineer, structural: Never    Marital Status: Married     Family History: The patient's family history includes Arthritis in her maternal aunt and mother; Atrial fibrillation in her cousin and mother; Cancer in her father, maternal uncle, mother, paternal uncle, and paternal uncle; Colon cancer in her cousin and paternal uncle; Colon cancer (age of onset: 20) in her father; Diabetes in her maternal aunt, maternal aunt, maternal aunt, maternal uncle, mother, paternal aunt, paternal uncle, paternal uncle, paternal uncle, paternal uncle, and sister; Early death in her father; Hearing loss in her maternal aunt, maternal uncle, paternal aunt, paternal uncle, and paternal uncle; Heart attack (age of onset: 29) in her father; Heart attack (age of onset: 47) in her paternal grandmother; Heart attack (age of onset: 18) in her mother; Heart disease in her father, maternal aunt, mother, paternal uncle, paternal uncle, and paternal uncle; Hyperlipidemia in her sister; Hypertension in her father, mother, paternal uncle, and sister; Obesity in her paternal uncle and paternal uncle; Stroke in her maternal grandmother, maternal uncle, and paternal grandfather.  ROS:   Review of Systems  Constitution: Negative for decreased appetite, fever and weight gain.  HENT: Negative for congestion, ear discharge, hoarse voice and sore throat.    Eyes: Negative for discharge, redness, vision loss in right eye and visual halos.  Cardiovascular: Negative for chest pain, dyspnea on exertion, leg swelling, orthopnea and palpitations.  Respiratory: Negative for cough, hemoptysis, shortness of breath and snoring.   Endocrine: Negative for heat intolerance and polyphagia.  Hematologic/Lymphatic: Negative for  bleeding problem. Does not bruise/bleed easily.  Skin: Negative for flushing, nail changes, rash and suspicious lesions.  Musculoskeletal: Negative for arthritis, joint pain, muscle cramps, myalgias, neck pain and stiffness.  Gastrointestinal: Negative for abdominal pain, bowel incontinence, diarrhea and excessive appetite.  Genitourinary: Negative for decreased libido, genital sores and incomplete emptying.  Neurological: Negative for brief paralysis, focal weakness, headaches and loss of balance.  Psychiatric/Behavioral: Negative for altered mental status, depression and suicidal ideas.  Allergic/Immunologic: Negative for HIV exposure and persistent infections.    EKGs/Labs/Other Studies Reviewed:    The following studies were reviewed today:   EKG:  The ekg ordered today demonstrates   Recent Labs: 02/10/2024: ALT 34; BUN 10; Creatinine, Ser 0.75; Magnesium 1.6; Platelets 173 02/18/2024: Hemoglobin 9.9; Potassium 3.7; Sodium 142  Recent Lipid Panel    Component Value Date/Time   CHOL 198 02/10/2024 0920   TRIG 142 02/10/2024 0920   HDL 64 02/10/2024 0920   CHOLHDL 3.1 02/10/2024 0920   LDLCALC 109 (H) 02/10/2024 0920    Physical Exam:    VS:  BP 124/77 (BP Location: Left Arm, Patient Position: Sitting, Cuff Size: Normal)   Pulse 67   Ht 5\' 1"  (1.549 m)   Wt 208 lb (94.3 kg)   SpO2 95%   BMI 39.30 kg/m     Wt Readings from Last 3 Encounters:  03/03/24 208 lb (94.3 kg)  02/18/24 205 lb (93 kg)  02/10/24 209 lb 12.8 oz (95.2 kg)     GEN: Well nourished, well developed in no acute distress HEENT: Normal NECK: No  JVD; No carotid bruits LYMPHATICS: No lymphadenopathy CARDIAC: S1S2 noted,RRR, no murmurs, rubs, gallops RESPIRATORY:  Clear to auscultation without rales, wheezing or rhonchi  ABDOMEN: Soft, non-tender, non-distended, +bowel sounds, no guarding. EXTREMITIES: No edema, No cyanosis, no clubbing MUSCULOSKELETAL:  No deformity  SKIN: Warm and dry NEUROLOGIC:  Alert and oriented x 3, non-focal PSYCHIATRIC:  Normal affect, good insight  ASSESSMENT:    1. Medication management   2. History of asthma   3. CAD in native artery   4. Primary hypertension   5. Mixed hyperlipidemia    PLAN:     Experiencing shortness of breath with some chest tightness.  The Lasix is not really making a big difference she tells me.  She had a history of childhood asthma going to get a pulmonary function test to make sure we are not missing a pulmonary etiology of this.  If her pulmonary function test is normal.  I will like to place the patient on the treadmill to get right-sided pulmonary artery systolic pressures. But for now we will continue the Lasix.  Coronary artery disease-I do not believe this is anginal symptoms.  Imdur has led to worsening headaches.  Will stop the Imdur today. She will start her Repatha this week she tells me.  LDL goal is less than 55.  The patient is in agreement with the above plan. The patient left the office in stable condition.  The patient will follow up in   Medication Adjustments/Labs and Tests Ordered: Current medicines are reviewed at length with the patient today.  Concerns regarding medicines are outlined above.  Orders Placed This Encounter  Procedures   Comprehensive Metabolic Panel (CMET)   Magnesium   Pulmonary function test   No orders of the defined types were placed in this encounter.   Patient Instructions  Medication Instructions:  Your physician has recommended you make the following change in your medication:  STOP: Imdur *If you need a refill on  your cardiac medications before your next appointment, please call your pharmacy*  Lab Work: CMET, Mag If you have labs (blood work) drawn today and your tests are completely normal, you will receive your results only by: MyChart Message (if you have MyChart) OR A paper copy in the mail If you have any lab test that is abnormal or we need to change your treatment, we will call you to review the results.  Testing/Procedures: Your physician has recommended that you have a pulmonary function test. Pulmonary Function Tests are a group of tests that measure how well air moves in and out of your lungs.  Pulmonary Function Tests Pulmonary function tests (PFTs) are breathing tests that are used to: Measure how well your lungs work. Find out what is causing your lung problems. Find the best treatment for you. You may have PFTs: If you have a condition that affects your lungs, such as asthma or chronic obstructive pulmonary disease (COPD). To watch for changes in your lung function over time if you have a long-term (chronic) lung disease. If you are an IT trainer. PFTs check the effects of being exposed to chemicals over a long period of time. To check lung function: Before having surgery or other procedures. If you smoke. To check if prescribed medicines or treatments are helping your lungs. Tell a health care provider about: Any allergies you have. All medicines you are taking, including inhaler or nebulizer medicines, vitamins, herbs, eye drops, creams, and over-the-counter medicines. Any bleeding problems you have. Any surgeries you have had, especially recent surgery of the eye, abdomen, or chest. These can make PFTs difficult or unsafe. Any medical conditions you have, including chest pain or heart problems, tuberculosis, or respiratory infections such as pneumonia, a cold, or the flu. Any fear of being in closed spaces (claustrophobia). Some of your tests may be in a closed  space. What are the risks? Your health care provider will talk with you about risks. These may include: Feeling light-headed due to fast, deep breathing known as overbreathing or hyperventilation. An asthma attack from deep breathing. What happens before the test? Take over-the-counter and prescription medicines only as told by your health care provider. If you take inhaler or nebulizer medicines, ask your health care provider which medicines you should take on the day of your testing. Some inhaler medicines may interfere with PFTs if they are taken shortly before the tests. Follow instructions from your health care provider about what you may eat and drink. These may include: Avoiding eating large meals. Avoiding using caffeine before the testing. Not drinkingalcohol for up to 4 hours before the test. Do not use any products that contain nicotine or tobacco for up to 4 hours before your test. These products include cigarettes, chewing tobacco, and vaping devices, such as e-cigarettes. These can affect your test results. If you need help quitting, ask your health care provider. Wear comfortable clothing that will not get in the way of your breathing. Avoid exercise that takes a lot of effort (strenuous exercise) for at least 30 minutes before the test. What happens during the test?  You will be given: A soft noseclip to wear. This allows all of your breaths to go through your mouth instead of your nose. A germ-free (sterile) mouthpiece. It will be attached to a spirometer machine that measures your breathing. You will be asked to do breathing exercises. The exercises will be done by breathing in (  inhaling) and breathing out (exhaling). You may have to repeat the exercises many times before the testing is complete. You will need to follow instructions exactly as told to get accurate results. Make sure to blow as hard and as fast as you can when you are told to do so. You may be given a medicine  called a bronchodilator. This makes the small air passages in your lungs larger so you can breathe easier. The tests will be repeated after the medicine takes effect. You will be watched for any problems, such as feeling faint or dizzy, or having trouble breathing. The procedure may vary among health care providers and hospitals. What can I expect after the test? Your results will be compared with the expected lung function of someone with healthy lungs who is similar to you in several ways. These ways include age, sex, height, weight, and race or ethnicity. This is done to show how your lung function compares with normal lung function (percent predicted). The percent predicted helps your health care provider know if your lung function is normal or not. If you have had PFTs done before, your health care provider will compare your current results with past results. This shows if your lung function is better, worse, or the same as before. It is up to you to get the results of your procedure. Ask your health care provider, or the department that is doing the procedure, when your results will be ready. After you get your results, talk with your health care provider about treatment options, if necessary. This information is not intended to replace advice given to you by your health care provider. Make sure you discuss any questions you have with your health care provider. Document Revised: 05/26/2022 Document Reviewed: 05/26/2022 Elsevier Patient Education  2024 Elsevier Inc.  Follow-Up: At [facility], you and your health needs are our priority.  As part of our continuing mission to provide you with exceptional heart care, our providers are all part of one team.  This team includes your primary Cardiologist (physician) and Advanced Practice Providers or APPs (Physician Assistants and Nurse Practitioners) who all work together to provide you with the care you need, when you need it.  Your next  appointment:   9 month(s)  Provider:   Thomasene Ripple, DO     Other Instructions:   1st Floor: - Lobby - Registration  - Pharmacy  - Lab - Cafe  2nd Floor: - PV Lab - Diagnostic Testing (echo, CT, nuclear med)  3rd Floor: - Vacant  4th Floor: - TCTS (cardiothoracic surgery) - AFib Clinic - Structural Heart Clinic - Vascular Surgery  - Vascular Ultrasound  5th Floor: - HeartCare Cardiology (general and EP) - Clinical Pharmacy for coumadin, hypertension, lipid, weight-loss medications, and med management appointments    Valet parking services will be available as well.      Adopting a Healthy Lifestyle.  Know what a healthy weight is for you (roughly BMI <25) and aim to maintain this   Aim for 7+ servings of fruits and vegetables daily   65-80+ fluid ounces of water or unsweet tea for healthy kidneys   Limit to max 1 drink of alcohol per day; avoid smoking/tobacco   Limit animal fats in diet for cholesterol and heart health - choose grass fed whenever available   Avoid highly processed foods, and foods high in saturated/trans fats   Aim for low stress - take time to unwind and care for your mental health  Aim for 150 min of moderate intensity exercise weekly for heart health, and weights twice weekly for bone health   Aim for 7-9 hours of sleep daily   When it comes to diets, agreement about the perfect plan isnt easy to find, even among the experts. Experts at the [facility] of [employer] developed an idea known as the Healthy Eating Plate. Just imagine a plate divided into logical, healthy portions.   The emphasis is on diet quality:   Load up on vegetables and fruits - one-half of your plate: Aim for color and variety, and remember that potatoes dont count.   Go for whole grains - one-quarter of your plate: Whole wheat, barley, wheat berries, quinoa, oats, brown rice, and foods made with them. If you want pasta, go with whole wheat pasta.    Protein power - one-quarter of your plate: Fish, chicken, beans, and nuts are all healthy, versatile protein sources. Limit red meat.   The diet, however, does go beyond the plate, offering a few other suggestions.   Use healthy plant oils, such as olive, canola, soy, corn, sunflower and peanut. Check the labels, and avoid partially hydrogenated oil, which have unhealthy trans fats.   If youre thirsty, drink water. Coffee and tea are good in moderation, but skip sugary drinks and limit milk and dairy products to one or two daily servings.   The type of carbohydrate in the diet is more important than the amount. Some sources of carbohydrates, such as vegetables, fruits, whole grains, and beans-are healthier than others.   Finally, stay active  Signed, Namiko Pritts, DO  03/03/2024 8:56 AM    [facility]

## 2024-03-07 ENCOUNTER — Encounter: Payer: Self-pay | Admitting: Cardiology

## 2024-03-15 ENCOUNTER — Ambulatory Visit (HOSPITAL_COMMUNITY)
Admission: RE | Admit: 2024-03-15 | Discharge: 2024-03-15 | Disposition: A | Discharge status: Home / Self Care | Source: Ambulatory Visit | Attending: Cardiology | Admitting: Cardiology

## 2024-03-15 DIAGNOSIS — Z8709 Personal history of other diseases of the respiratory system: Secondary | ICD-10-CM | POA: Insufficient documentation

## 2024-03-15 DIAGNOSIS — R942 Abnormal results of pulmonary function studies: Secondary | ICD-10-CM | POA: Insufficient documentation

## 2024-03-15 LAB — PULMONARY FUNCTION TEST
DL/VA % pred: 79 %
DL/VA: 3.32 ml/min/mmHg/L
DLCO unc % pred: 64 %
DLCO unc: 11.42 ml/min/mmHg
FEF 25-75 Post: 1.63 L/s
FEF 25-75 Pre: 1.84 L/s
FEF2575-%Change-Post: -11 %
FEF2575-%Pred-Post: 101 %
FEF2575-%Pred-Pre: 114 %
FEV1-%Change-Post: -2 %
FEV1-%Pred-Post: 90 %
FEV1-%Pred-Pre: 92 %
FEV1-Post: 1.76 L
FEV1-Pre: 1.8 L
FEV1FVC-%Change-Post: 6 %
FEV1FVC-%Pred-Pre: 106 %
FEV6-%Change-Post: -8 %
FEV6-%Pred-Post: 83 %
FEV6-%Pred-Pre: 91 %
FEV6-Post: 2.05 L
FEV6-Pre: 2.25 L
FEV6FVC-%Pred-Post: 105 %
FEV6FVC-%Pred-Pre: 105 %
FVC-%Change-Post: -8 %
FVC-%Pred-Post: 79 %
FVC-%Pred-Pre: 86 %
FVC-Post: 2.05 L
FVC-Pre: 2.25 L
Post FEV1/FVC ratio: 86 %
Post FEV6/FVC ratio: 100 %
Pre FEV1/FVC ratio: 80 %
Pre FEV6/FVC Ratio: 100 %
RV % pred: 97 %
RV: 2.08 L
TLC % pred: 91 %
TLC: 4.28 L

## 2024-03-15 MED ORDER — ALBUTEROL SULFATE (2.5 MG/3ML) 0.083% IN NEBU
2.5000 mg | INHALATION_SOLUTION | Freq: Once | RESPIRATORY_TRACT | Status: AC
  Administered 2024-03-15: 2.5 mg via RESPIRATORY_TRACT

## 2024-03-18 NOTE — Assessment & Plan Note (Signed)
 Stage IA, (pT1c, pN0), ER+/PR+/HER2-, Grade II  -found on screening mammogram. S/p right lumpectomy on 02/18/21 under Dr. 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 Margins and lymph nodes negative -Oncotype DX score of 0. -s/p radiation under Dr. [name] 04/09/21 - 05/07/21. -she started tamoxifen  in 05/2021, goal 5 years. She is tolerating well except some vaginal dryness/discharge.

## 2024-03-21 ENCOUNTER — Inpatient Hospital Stay (HOSPITAL_BASED_OUTPATIENT_CLINIC_OR_DEPARTMENT_OTHER): Payer: Medicare Other | Admitting: Hematology

## 2024-03-21 ENCOUNTER — Inpatient Hospital Stay: Payer: Medicare Other | Attending: Hematology

## 2024-03-21 VITALS — BP 122/60 | HR 63 | Temp 98.2°F | Resp 20 | Ht 61.0 in | Wt 209.0 lb

## 2024-03-21 DIAGNOSIS — C50411 Malignant neoplasm of upper-outer quadrant of right female breast: Secondary | ICD-10-CM

## 2024-03-21 DIAGNOSIS — E538 Deficiency of other specified B group vitamins: Secondary | ICD-10-CM

## 2024-03-21 DIAGNOSIS — Z79810 Long term (current) use of selective estrogen receptor modulators (SERMs): Secondary | ICD-10-CM | POA: Insufficient documentation

## 2024-03-21 DIAGNOSIS — Z1732 Human epidermal growth factor receptor 2 negative status: Secondary | ICD-10-CM | POA: Insufficient documentation

## 2024-03-21 DIAGNOSIS — Z923 Personal history of irradiation: Secondary | ICD-10-CM | POA: Insufficient documentation

## 2024-03-21 DIAGNOSIS — Z170 Estrogen receptor positive status [ER+]: Secondary | ICD-10-CM

## 2024-03-21 DIAGNOSIS — Z1721 Progesterone receptor positive status: Secondary | ICD-10-CM | POA: Diagnosis not present

## 2024-03-21 DIAGNOSIS — I4891 Unspecified atrial fibrillation: Secondary | ICD-10-CM | POA: Diagnosis not present

## 2024-03-21 DIAGNOSIS — D649 Anemia, unspecified: Secondary | ICD-10-CM | POA: Diagnosis not present

## 2024-03-21 DIAGNOSIS — E785 Hyperlipidemia, unspecified: Secondary | ICD-10-CM | POA: Insufficient documentation

## 2024-03-21 DIAGNOSIS — D509 Iron deficiency anemia, unspecified: Secondary | ICD-10-CM

## 2024-03-21 DIAGNOSIS — Z7901 Long term (current) use of anticoagulants: Secondary | ICD-10-CM | POA: Diagnosis not present

## 2024-03-21 DIAGNOSIS — I5030 Unspecified diastolic (congestive) heart failure: Secondary | ICD-10-CM | POA: Insufficient documentation

## 2024-03-21 DIAGNOSIS — Z7984 Long term (current) use of oral hypoglycemic drugs: Secondary | ICD-10-CM | POA: Insufficient documentation

## 2024-03-21 DIAGNOSIS — E119 Type 2 diabetes mellitus without complications: Secondary | ICD-10-CM | POA: Diagnosis not present

## 2024-03-21 LAB — CBC WITH DIFFERENTIAL (CANCER CENTER ONLY)
Abs Immature Granulocytes: 0.01 10*3/uL (ref 0.00–0.07)
Basophils Absolute: 0 10*3/uL (ref 0.0–0.1)
Basophils Relative: 0 %
Eosinophils Absolute: 0.1 10*3/uL (ref 0.0–0.5)
Eosinophils Relative: 2 %
HCT: 32 % — ABNORMAL LOW (ref 36.0–46.0)
Hemoglobin: 10.6 g/dL — ABNORMAL LOW (ref 12.0–15.0)
Immature Granulocytes: 0 %
Lymphocytes Relative: 31 %
Lymphs Abs: 1.8 10*3/uL (ref 0.7–4.0)
MCH: 31.3 pg (ref 26.0–34.0)
MCHC: 33.1 g/dL (ref 30.0–36.0)
MCV: 94.4 fL (ref 80.0–100.0)
Monocytes Absolute: 0.5 10*3/uL (ref 0.1–1.0)
Monocytes Relative: 9 %
Neutro Abs: 3.3 10*3/uL (ref 1.7–7.7)
Neutrophils Relative %: 58 %
Platelet Count: 163 10*3/uL (ref 150–400)
RBC: 3.39 MIL/uL — ABNORMAL LOW (ref 3.87–5.11)
RDW: 14.1 % (ref 11.5–15.5)
WBC Count: 5.8 10*3/uL (ref 4.0–10.5)
nRBC: 0 % (ref 0.0–0.2)

## 2024-03-21 LAB — CMP (CANCER CENTER ONLY)
ALT: 31 U/L (ref 0–44)
AST: 69 U/L — ABNORMAL HIGH (ref 15–41)
Albumin: 4.2 g/dL (ref 3.5–5.0)
Alkaline Phosphatase: 67 U/L (ref 38–126)
Anion gap: 8 (ref 5–15)
BUN: 17 mg/dL (ref 8–23)
CO2: 31 mmol/L (ref 22–32)
Calcium: 9 mg/dL (ref 8.9–10.3)
Chloride: 100 mmol/L (ref 98–111)
Creatinine: 0.8 mg/dL (ref 0.44–1.00)
GFR, Estimated: >60 mL/min (ref >60)
Glucose, Bld: 130 mg/dL — ABNORMAL HIGH (ref 70–99)
Potassium: 3.6 mmol/L (ref 3.5–5.1)
Sodium: 139 mmol/L (ref 135–145)
Total Bilirubin: 0.4 mg/dL (ref 0.0–1.2)
Total Protein: 7.4 g/dL (ref 6.5–8.1)

## 2024-03-21 LAB — IRON AND IRON BINDING CAPACITY (CC-WL,HP ONLY)
Iron: 69 ug/dL (ref 28–170)
Saturation Ratios: 12 % (ref 10.4–31.8)
TIBC: 584 ug/dL — ABNORMAL HIGH (ref 250–450)
UIBC: 515 ug/dL — ABNORMAL HIGH (ref 148–442)

## 2024-03-21 LAB — FERRITIN: Ferritin: 65 ng/mL (ref 11–307)

## 2024-03-21 LAB — VITAMIN B12: Vitamin B-12: 545 pg/mL (ref 180–914)

## 2024-03-21 NOTE — Progress Notes (Signed)
 [facility]   [phone] [phone]   Clinic Follow up Note   Patient Care Team: [name], DO as PCP - General (Family Medicine) [name], MD as PCP - Electrophysiology (Cardiology) [name], MD as PCP - Sleep Medicine (Cardiology) [name], DO as PCP - Cardiology (Cardiology) [name], MD as Consulting Physician (General Surgery) Sonja Villalba, MD as Consulting Physician (Hematology) [name], MD as Attending Physician (Radiation Oncology) [name] as Physician Assistant (Cardiology) Burton, [name], NP as Nurse Practitioner (Nurse Practitioner)  Date of Service:  03/21/2024  CHIEF COMPLAINT: f/u of breast cancer   CURRENT THERAPY:  Tamoxifen    Oncology History   Malignant neoplasm of upper-outer quadrant of right breast in female, estrogen receptor positive (HCC) Stage IA, (pT1c, pN0), ER+/PR+/HER2-, Grade II  -found on screening mammogram. S/p right lumpectomy on 02/18/21 under Dr. 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 Margins and lymph nodes negative -Oncotype DX score of 0. -s/p radiation under Dr. [name] 04/09/21 - 05/07/21. -she started tamoxifen  in 05/2021, goal 5 years. She is tolerating well except some vaginal dryness/discharge.   Assessment & Plan Breast cancer Breast exam is normal with no palpable masses. Nipple is flat and eroded, but this is not a new finding. Current symptoms of chest tightness and shortness of breath are probably not related to breast cancer but cardiac workup has not been able to explain her symptoms. I recommend A CT scan of the chest to rule out any cancer-related causes for her symptoms. - Schedule CT scan with IV contrast in the next two weeks - Communicate results based on findings  Diastolic heart failure, AF and CAD Reports chest tightness and shortness of breath, especially on exertion. Recent heart catheterization showed some blockage but not severe  enough for stenting. Diastolic dysfunction noted, likely contributing to symptoms. Pulmonary hypertension is being considered as a potential cause of symptoms. - Continue Lasix  and metoprolol  - Coordinate with pulmonologist for further evaluation  Atrial fibrillation Atrial fibrillation is managed with metoprolol  and Eliquis . Heart rate is controlled, usually around 90 bpm. No recent episodes of atrial fibrillation reported. - Continue metoprolol  and Eliquis   Anemia, unspecified Hemoglobin is 10.6, slightly lower than previous value of 9.9. Iron studies and B12 levels are pending. Anemia is likely contributing to fatigue and shortness of breath. - Continue prenatal vitamin - Review iron and B12 levels when available  Type 2 diabetes mellitus Diabetes is managed with metformin . - Continue metformin   Hyperlipidemia Hyperlipidemia is managed with Repatha  injections every two weeks. - Continue Repatha  injections  Balance and mobility issues Reports balance issues and limited mobility, requiring furniture for support. Symptoms have worsened over the past three months. A cane has been recommended but not yet adopted. - Consider use of a cane for stability - Evaluate need for physical therapy  Plan -due to her recent chest pain and dyspnea, I will order CT chest w contrast in next few weeks, will call her with results.  -she will f/u with cardiology and see pulmonary also for her symptoms  -lab and f/u in 6 months      SUMMARY OF ONCOLOGIC HISTORY: Oncology History Overview Note  Cancer Staging Malignant neoplasm of upper-outer quadrant of right breast in female, estrogen receptor positive (HCC) Staging form: Breast, AJCC 8th Edition - Clinical stage from 11/22/2020: Stage IA (cT1b, cN0, cM0, G2, ER+, PR+, HER2-) - Signed by Sonja [place], MD on 12/04/2020 Stage prefix: Initial  diagnosis - Pathologic: Stage IA (pT1c, pN0, cM0, G2, ER+, PR+, HER2-) - Signed by [name], NP  on 02/27/2021 Histologic grading system: 3 grade system    Malignant neoplasm of upper-outer quadrant of right breast in female, estrogen receptor positive (HCC)  11/05/2020 Breast US    US  Breast 11/05/20  IMPRESSION No Sonographic correlate is seen for the round microlobulated 9mm mass in the upper outer right breast 10cm from the nipple, 9: 00 position.  Given this mass is new on mammography and has suspicious borders, a stereotactic guided biopsy is recommended.     11/22/2020 Cancer Staging   Staging form: Breast, AJCC 8th Edition - Clinical stage from 11/22/2020: Stage IA (cT1b, cN0, cM0, G2, ER+, PR+, HER2-) - Signed by Sonja [place], MD on 12/04/2020   11/22/2020 Initial Biopsy    Diagnosis 11/22/20  Breast, right, needle core biopsy, 10 cmfn, upper outer quadrant, post depth - INVASIVE DUCTAL CARCINOMA. SEE NOTE Diagnosis Note Carcinoma measures 0.7 cm in greatest linear dimension and appears grade 2. [name] reviewed the case and concurs with the diagnosis. A breast prognostic profile (ER, PR, Ki-67 and HER2) is pending and will be reported in an addendum. [name] was notified on 11/23/2020.   11/22/2020 Receptors her2    ROGNOSTIC INDICATORS Results: IMMUNOHISTOCHEMICAL AND MORPHOMETRIC ANALYSIS PERFORMED MANUALLY The tumor cells are NEGATIVE for Her2 (1+). Estrogen Receptor: 95%, POSITIVE, STRONG STAINING INTENSITY Progesterone Receptor: 95%, POSITIVE, STRONG STAINING INTENSITY Proliferation Marker Ki67: 10%   11/28/2020 Initial Diagnosis   Malignant neoplasm of upper-outer quadrant of right breast in female, estrogen receptor positive (HCC)   12/22/2020 Genetic Testing   Negative hereditary cancer genetic testing: no pathogenic variants detected in [facility] Common Hereditary Cancers Panel + Melanoma Genes.  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  The report date is December 22, 2020.    The Common Hereditary Cancers Panel+Melanoma Genes  offered by [facility] includes sequencing and/or deletion duplication testing of the following 52 genes: APC, ATM, AXIN2, BAP1, BARD1, BMPR1A, BRCA1, BRCA2, BRIP1, CDH1, CDK4, CDKN2A (p14ARF), CDKN2A (p16INK4a), CHEK2, CTNNA1, DICER1, EPCAM (Deletion/duplication testing only), GREM1 (promoter region deletion/duplication testing only), HOXB13, KIT, MEN1, MITF, MLH1, MSH2, MSH3, MSH6, MUTYH, NBN, NF1, NHTL1, PALB2, PDGFRA, PMS2, POLD1, POLE, PTEN, POT1, RAD50, RAD51C, RAD51D, RB1, RNF43, SDHA, SDHB, SDHC, SDHD, SMAD4, SMARCA4, STK11, TP53, TSC1, TSC2, and VHL.  The following genes were evaluated for sequence changes only: SDHA and HOXB13 c.251G>A variant only.   02/18/2021 Surgery   FINAL MICROSCOPIC DIAGNOSIS:   A. BREAST, RIGHT, LUMPECTOMY:  - Invasive ductal carcinoma, 1.2 cm, grade 2  - Ductal carcinoma in situ, intermediate grade  - Resection margins are negative for carcinoma; posterior margin is less than 1 mm from carcinoma  - Biopsy site changes  - See oncology table   B. LYMPH NODE, RIGHT AXILLARY #1, SENTINEL, EXCISION:  - Lymph node, negative for carcinoma (0/1)   C. LYMPH NODE, RIGHT AXILLARY #2, SENTINEL, EXCISION:  - Lymph node, negative for carcinoma (0/1)   D. LYMPH NODE, RIGHT AXILLARY, SENTINEL, EXCISION:  - Lymph node, negative for carcinoma (0/1)   E. LYMPH NODE, RIGHT AXILLARY #3, SENTINEL, EXCISION:  - Lymph node, negative for carcinoma (0/1)   F. LYMPH NODE, RIGHT AXILLARY, SENTINEL, EXCISION:  - Lymph node, negative for carcinoma (0/1)     02/18/2021 Oncotype testing   Oncotype DX was obtained on the final surgical sample and the recurrence score of 0 predicts a risk of recurrence  outside the breast over the next 9 years of 3%, if the patient's only systemic therapy is an antiestrogen for 5 years.  It also predicts no benefit from chemotherapy.   02/27/2021 Cancer Staging   Staging form: Breast, AJCC 8th Edition - Pathologic: Stage IA (pT1c, pN0, cM0, G2, ER+, PR+,  HER2-) - Signed by [name], NP on 02/27/2021 Histologic grading system: 3 grade system   03/2021 - 04/2021 Radiation Therapy   Completed adjuvant Radiation by Dr. [name]   05/2021 -  Neo-Adjuvant Anti-estrogen oral therapy   Began tamoxifen  mid July 2022   07/29/2021 Survivorship   SCP delivered by Lacie Burton, NP      Discussed the use of AI scribe software for clinical note transcription with the patient, who gave verbal consent to proceed.  History of Present Illness Kayla Bennett is a 73 year old female with breast cancer who presents for follow-up.  She experiences chest tightness and pain for at least three months, particularly when walking about 100 feet on flat ground, progressing to pain if she continues. Shortness of breath occurs, especially when walking up ramps. Heart catheterization and echocardiogram show some blockage and diastolic dysfunction. Pulmonary function test results are pending.  She has atrial fibrillation, managed with metoprolol  and Eliquis . Her heart rate is usually around 90 bpm. No significant cough or weight loss. Kidney function is normal.  She has a history of anemia with a hemoglobin level of 10.6 and takes prenatal vitamins. She follows a gluten-free diet, which has resolved previous bowel issues.     All other systems were reviewed with the patient and are negative.  MEDICAL HISTORY:  Past Medical History:  Diagnosis Date   Allergy 1978   Food allergies, seasonal allergies, medicine allergies   Anemia    Arthritis    Asthma    Blood transfusion without reported diagnosis 1985   Breast cancer (HCC)    Diabetes (HCC)    Dyspnea    Dysrhythmia    afibb on occasion   Family history of adverse reaction to anesthesia    mother had reaction to versed ...during surgery, stopped breathing   Fibromyalgia    GERD (gastroesophageal reflux disease)    Headache    History of hiatal hernia    Hyperlipidemia 1990?   Hypertension     Migraines    Osteopenia    Osteoporosis    Oxygen  deficiency 2023   Psoriasis    Sleep apnea 2023   Yeast infection 10/30/2021    SURGICAL HISTORY: Past Surgical History:  Procedure Laterality Date   ABDOMINAL HYSTERECTOMY     APPENDECTOMY     ATRIAL FIBRILLATION ABLATION N/A 12/10/2021   Procedure: ATRIAL FIBRILLATION ABLATION;  Surgeon: [name], MD;  Location: [facility] INVASIVE CV LAB;  Service: Cardiovascular;  Laterality: N/A;   BREAST LUMPECTOMY WITH RADIOACTIVE SEED AND SENTINEL LYMPH NODE BIOPSY Right 02/18/2021   Procedure: RIGHT BREAST LUMPECTOMY WITH RADIOACTIVE SEED AND SENTINEL LYMPH NODE BIOPSY;  Surgeon: [name], MD;  Location: [facility] OR;  Service: General;  Laterality: Right;   BREAST SURGERY  2022   broken wrist     CARDIOVERSION N/A 04/04/2021   Procedure: CARDIOVERSION;  Surgeon: [name], MD;  Location: [facility] ENDOSCOPY;  Service: Cardiovascular;  Laterality: N/A;   CARDIOVERSION N/A 08/14/2021   Procedure: CARDIOVERSION;  Surgeon: Harrold Lincoln, MD;  Location: [facility] ENDOSCOPY;  Service: Cardiovascular;  Laterality: N/A;   CARDIOVERSION N/A 09/19/2021   Procedure: CARDIOVERSION;  Surgeon:  Harrold Lincoln, MD;  Location: [facility] ENDOSCOPY;  Service: Cardiovascular;  Laterality: N/A;   CESAREAN SECTION  1976 and 1977   CHOLECYSTECTOMY     FRACTURE SURGERY     right wrist   HYSTERECTOMY ABDOMINAL WITH SALPINGO-OOPHORECTOMY     LYSIS OF ADHESION     RIGHT/LEFT HEART CATH AND CORONARY ANGIOGRAPHY N/A 02/18/2024   Procedure: RIGHT/LEFT HEART CATH AND CORONARY ANGIOGRAPHY;  Surgeon: Odie Benne, MD;  Location: [facility] INVAS[facility] CV [facility];  Service: Cardiovascular;  Laterality: N/A;   SHOULDER SURGERY Left    TUBAL LIGATION      I have reviewed the social history and family history with the patient and they are unchanged from previous note.  ALLERGIES:  is allergic to lotensin [benazepril hcl], morphine, erythromycin base, hydrocodone , other,  banana, chocolate, gluten meal, tizanidine, and wound dressing adhesive.  MEDICATIONS:  Current Outpatient Medications  Medication Sig Dispense Refill   acetaminophen  (TYLENOL ) 500 MG tablet Take 500 mg by mouth every 6 (six) hours as needed for moderate pain, headache or mild pain.     albuterol  (VENTOLIN  HFA) 108 (90 Base) MCG/ACT inhaler Inhale 2 puffs into the lungs every 6 (six) hours as needed for wheezing or shortness of breath. 8 g 0   apixaban  (ELIQUIS ) 5 MG TABS tablet TAKE ONE TABLET BY MOUTH TWICE DAILY 180 tablet 1   calcipotriene (DOVONOX) 0.005 % cream Apply 1 application  topically See admin instructions. Mix with fluorouracil 5% cream and apply topically twice daily for five days - as needed for precancerous on right cheek.     Calcium Carbonate (CALCIUM 600 PO) Take 600 mg by mouth daily.     cetirizine (ZYRTEC) 10 MG tablet Take 10 mg by mouth in the morning.     cholecalciferol (VITAMIN D3) 25 MCG (1000 UNIT) tablet Take 1,000 Units by mouth in the morning and at bedtime.     Evolocumab  (REPATHA  SURECLICK) 140 MG/ML SOAJ Inject 140 mg into the skin every 14 (fourteen) days. 2 mL 2   ferrous sulfate 325 (65 FE) MG tablet Take 325 mg by mouth every morning.     fluorouracil (EFUDEX) 5 % cream Apply 1 application  topically 2 (two) times daily as needed (precancerous spots on right temple and left eyebrow). Mix with calcipotriene 0.005% cream and apply topically twice daily for five days - as needed for precancerous on right temple and left eyebrow     furosemide  (LASIX ) 40 MG tablet Take 1 tablet (40 mg total) by mouth daily. 90 tablet 1   gabapentin  (NEURONTIN ) 100 MG capsule TAKE THREE CAPSULES BY MOUTH AT BEDTIME (Patient taking differently: Take 200 mg by mouth at bedtime.) 540 capsule 0   hydrocortisone 2.5 % cream Apply 1 Application topically 2 (two) times daily.     ibuprofen  (ADVIL ) 200 MG tablet Take 200 mg by mouth every 6 (six) hours as needed for mild pain (pain score  1-3).     losartan  (COZAAR ) 100 MG tablet Take 1 tablet (100 mg total) by mouth daily. 90 tablet 1   metFORMIN  (GLUCOPHAGE -XR) 500 MG 24 hr tablet TAKE ONE TABLET BY MOUTH EVERY DAY (Patient taking differently: Take 500 mg by mouth at bedtime.) 90 tablet 3   metoprolol  succinate (TOPROL -XL) 100 MG 24 hr tablet Take 1 tablet (100 mg total) by mouth daily. Take with or immediately following a meal. 90 tablet 2   pantoprazole  (PROTONIX ) 40 MG tablet TAKE ONE TABLET BY MOUTH TWICE DAILY 180 tablet  0   potassium chloride  SA (KLOR-CON  M) 20 MEQ tablet Take 1 tablet (20 mEq total) by mouth daily. 30 tablet 0   Prenatal Vit-Fe Fumarate-FA (PRENATAL VITAMIN PLUS LOW IRON PO) Take 1 tablet by mouth in the morning.     tamoxifen  (NOLVADEX ) 20 MG tablet Take 1 tablet (20 mg total) by mouth every morning. 90 tablet 3   triamcinolone (KENALOG) 0.1 % Apply 1 application  topically 2 (two) times daily as needed (psoriasis). Mixed with Cetaphil 1:1     UNABLE TO FIND OUTPATIENT PHYSICAL THERAPY    Diagnosis: Generalized debility due to UTI, acute gastroenteritis 1 Mutually Defined 0   venlafaxine  XR (EFFEXOR -XR) 37.5 MG 24 hr capsule Take 1 capsule (37.5 mg total) by mouth at bedtime. 90 capsule 3   venlafaxine  XR (EFFEXOR -XR) 75 MG 24 hr capsule TAKE ONE CAPSULE BY MOUTH EVERY DAY 90 capsule 0   vitamin C (ASCORBIC ACID) 500 MG tablet Take 500 mg by mouth daily.     No current facility-administered medications for this visit.    PHYSICAL EXAMINATION: ECOG PERFORMANCE STATUS: 2 - Symptomatic, <50% confined to bed  Vitals:   03/21/24 1243  BP: 122/60  Pulse: 63  Resp: 20  Temp: 98.2 F (36.8 C)  SpO2: 95%   Wt Readings from Last 3 Encounters:  03/21/24 209 lb (94.8 kg)  03/03/24 208 lb (94.3 kg)  02/18/24 205 lb (93 kg)     GENERAL:alert, no distress and comfortable SKIN: skin color, texture, turgor are normal, no rashes or significant lesions EYES: normal, Conjunctiva are pink and  non-injected, sclera clear NECK: supple, thyroid  normal size, non-tender, without nodularity LYMPH:  no palpable lymphadenopathy in the cervical, axillary  LUNGS: clear to auscultation and percussion with normal breathing effort HEART: regular rate & rhythm and no murmurs and no lower extremity edema ABDOMEN:abdomen soft, non-tender and normal bowel sounds Musculoskeletal:no cyanosis of digits and no clubbing  NEURO: alert & oriented x 3 with fluent speech, no focal motor/sensory deficits  Physical Exam CHEST: Lungs clear to auscultation bilaterally. BREAST: Breasts symmetrical, no masses, no tenderness.  LABORATORY DATA:  I have reviewed the data as listed    Latest Ref Rng & Units 03/21/2024   12:25 PM 02/18/2024    9:51 AM 02/18/2024    9:49 AM  CBC  WBC 4.0 - 10.5 K/uL 5.8     Hemoglobin 12.0 - 15.0 g/dL 09.8  9.9  11.9   Hematocrit 36.0 - 46.0 % 32.0  29.0  30.0   Platelets 150 - 400 K/uL 163           Latest Ref Rng & Units 03/21/2024   12:25 PM 03/03/2024    9:04 AM 02/18/2024    9:51 AM  CMP  Glucose 70 - 99 mg/dL 147  829    BUN 8 - 23 mg/dL 17  21    Creatinine 5.62 - 1.00 mg/dL 1.30  8.65    Sodium 784 - 145 mmol/L 139  140  142   Potassium 3.5 - 5.1 mmol/L 3.6  4.1  3.7   Chloride 98 - 111 mmol/L 100  100    CO2 22 - 32 mmol/L 31  25    Calcium 8.9 - 10.3 mg/dL 9.0  9.0    Total Protein 6.5 - 8.1 g/dL 7.4  7.2    Total Bilirubin 0.0 - 1.2 mg/dL 0.4  0.3    Alkaline Phos 38 - 126 U/L 67  82  AST 15 - 41 U/L 69  88    ALT 0 - 44 U/L 31  38        RADIOGRAPHIC STUDIES: I have personally reviewed the radiological images as listed and agreed with the findings in the report. No results found.    Orders Placed This Encounter  Procedures   CT Chest W Contrast    Standing Status:   Future    Expected Date:   04/04/2024    Expiration Date:   03/21/2025    If indicated for the ordered procedure, I authorize the administration of contrast media per Radiology  protocol:   Yes    Does the patient have a contrast media/X-ray dye allergy?:   No    Preferred imaging location?:   [facility]   All questions were answered. The patient knows to call the clinic with any problems, questions or concerns. No barriers to learning was detected. The total time spent in the appointment was 30 minutes.     Sonja Butler, MD 03/21/2024

## 2024-03-22 ENCOUNTER — Other Ambulatory Visit: Payer: Self-pay

## 2024-03-23 ENCOUNTER — Telehealth: Payer: Self-pay | Admitting: Hematology

## 2024-03-29 ENCOUNTER — Ambulatory Visit: Payer: Self-pay | Admitting: Cardiology

## 2024-04-04 ENCOUNTER — Ambulatory Visit (HOSPITAL_COMMUNITY)
Admission: RE | Admit: 2024-04-04 | Discharge: 2024-04-04 | Disposition: A | Discharge status: Home / Self Care | Source: Ambulatory Visit | Attending: Hematology | Admitting: Hematology

## 2024-04-04 DIAGNOSIS — Z170 Estrogen receptor positive status [ER+]: Secondary | ICD-10-CM | POA: Diagnosis not present

## 2024-04-04 DIAGNOSIS — C50411 Malignant neoplasm of upper-outer quadrant of right female breast: Secondary | ICD-10-CM | POA: Insufficient documentation

## 2024-04-04 DIAGNOSIS — R918 Other nonspecific abnormal finding of lung field: Secondary | ICD-10-CM | POA: Diagnosis not present

## 2024-04-04 DIAGNOSIS — R0600 Dyspnea, unspecified: Secondary | ICD-10-CM | POA: Diagnosis not present

## 2024-04-04 MED ORDER — SODIUM CHLORIDE (PF) 0.9 % IJ SOLN
INTRAMUSCULAR | Status: AC
  Filled 2024-04-04: qty 50

## 2024-04-04 MED ORDER — IOHEXOL 300 MG/ML  SOLN
75.0000 mL | Freq: Once | INTRAMUSCULAR | Status: AC | PRN
  Administered 2024-04-04: 75 mL via INTRAVENOUS

## 2024-04-05 NOTE — Progress Notes (Signed)
 Spoke with Bartholomew Light with [facility].  Requested expedited read on pt's recent CT Chest scan.  Bartholomew Light stated she would move up to be read ASAP.

## 2024-04-05 NOTE — Assessment & Plan Note (Signed)
 Stage IA, (pT1c, pN0), ER+/PR+/HER2-, Grade II  -found on screening mammogram. S/p right lumpectomy on 02/18/21 under Dr. 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 Margins and lymph nodes negative -Oncotype DX score of 0. -s/p radiation under Dr. [name] 04/09/21 - 05/07/21. -she started tamoxifen  in 05/2021, goal 5 years. She is tolerating well except some vaginal dryness/discharge.

## 2024-04-06 ENCOUNTER — Inpatient Hospital Stay (HOSPITAL_BASED_OUTPATIENT_CLINIC_OR_DEPARTMENT_OTHER): Admitting: Hematology

## 2024-04-06 DIAGNOSIS — Z170 Estrogen receptor positive status [ER+]: Secondary | ICD-10-CM

## 2024-04-06 DIAGNOSIS — C50411 Malignant neoplasm of upper-outer quadrant of right female breast: Secondary | ICD-10-CM | POA: Diagnosis not present

## 2024-04-06 DIAGNOSIS — Z1732 Human epidermal growth factor receptor 2 negative status: Secondary | ICD-10-CM | POA: Diagnosis not present

## 2024-04-06 DIAGNOSIS — I5030 Unspecified diastolic (congestive) heart failure: Secondary | ICD-10-CM | POA: Diagnosis not present

## 2024-04-06 DIAGNOSIS — Z79810 Long term (current) use of selective estrogen receptor modulators (SERMs): Secondary | ICD-10-CM | POA: Diagnosis not present

## 2024-04-06 DIAGNOSIS — Z1721 Progesterone receptor positive status: Secondary | ICD-10-CM | POA: Diagnosis not present

## 2024-04-06 NOTE — Progress Notes (Signed)
 [name]   [phone] [phone]   Clinic Follow up Note   Patient Care Team: [name], DO as PCP - General (Family Medicine) [name], MD as PCP - Electrophysiology (Cardiology) [name], MD as PCP - Sleep Medicine (Cardiology) [name], DO as PCP - Cardiology (Cardiology) [name], MD as Consulting Physician (General Surgery) [name], MD as Consulting Physician (Hematology) [name], MD as Attending Physician (Radiation Oncology) [name] as Physician Assistant (Cardiology) Burton, [name], NP as Nurse Practitioner (Nurse Practitioner) 04/06/2024  I connected with [name] on 04/06/24 at  8:40 AM EDT by telephone and verified that I am speaking with the correct person using two identifiers.   I discussed the limitations, risks, security and privacy concerns of performing an evaluation and management service by telephone and the availability of in person appointments. I also discussed with the patient that there may be a patient responsible charge related to this service. The patient expressed understanding and agreed to proceed.   Patient's location:  Home  Provider's location:  Office    CHIEF COMPLAINT: f/u CT result    CURRENT THERAPY:   Oncology history Malignant neoplasm of upper-outer quadrant of right breast in female, estrogen receptor positive (HCC) Stage IA, (pT1c, pN0), ER+/PR+/HER2-, Grade II  -found on screening mammogram. S/p right lumpectomy on 02/18/21 under Dr. 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 Margins and lymph nodes negative -Oncotype DX score of 0. -s/p radiation under Dr. [name] 04/09/21 - 05/07/21. -she started tamoxifen  in 05/2021, goal 5 years. She is tolerating well except some vaginal dryness/discharge.    Assessment & Plan Chest discomfort Persistent chest discomfort with no improvement. CT scan shows no signs of cancer in the chest. A small  lung nodule and granulomatous changes are present, likely benign and not active. Cardiologist has performed echocardiogram and heart catheterization. Treadmill test was considered but not feasible due to balance issues. Pulmonary function test was normal. Further evaluation by a pulmonologist is considered reasonable.  -I recommend Signatera blood test to detect circulating tumor DNA, which if negative, would suggest symptoms are not cancer-related. - Send message to cardiologist to discuss CT chest results and potential need for pulmonologist referral - Arrange Signatera blood test to detect circulating tumor DNA  Fatty liver Fatty liver infiltration noted on CT scan. No signs of significant liver disease or cirrhosis. Common with aging and not currently concerning.  Hiatal hernia Known hiatal hernia, managed with pantoprazole .  Plan - I discussed her CT scan findings, no evidence of cancer recurrence - Will order ctDNA Signatera test - Will communicated with her cardiologist Dr. Emmette Harms, regarding her workup and referral to pulmonary. - Lab and follow-up in 6 months, or sooner if needed.    SUMMARY OF ONCOLOGIC HISTORY: Oncology History Overview Note  Cancer Staging Malignant neoplasm of upper-outer quadrant of right breast in female, estrogen receptor positive (HCC) Staging form: Breast, AJCC 8th Edition - Clinical stage from 11/22/2020: Stage IA (cT1b, cN0, cM0, G2, ER+, PR+, HER2-) - Signed by [name], MD on 12/04/2020 Stage prefix: Initial diagnosis - Pathologic: Stage IA (pT1c, pN0, cM0, G2, ER+, PR+, HER2-) - Signed by [name], NP on 02/27/2021 Histologic grading system: 3 grade system    Malignant neoplasm of upper-outer quadrant of right breast in female, estrogen receptor positive (HCC)  11/05/2020 Breast US    US  Breast 11/05/20  IMPRESSION No Sonographic correlate is seen for the  round microlobulated 9mm mass in the upper outer right breast 10cm from the  nipple, 9: 00 position.  Given this mass is new on mammography and has suspicious borders, a stereotactic guided biopsy is recommended.     11/22/2020 Cancer Staging   Staging form: Breast, AJCC 8th Edition - Clinical stage from 11/22/2020: Stage IA (cT1b, cN0, cM0, G2, ER+, PR+, HER2-) - Signed by Sonja Worthington, MD on 12/04/2020   11/22/2020 Initial Biopsy    Diagnosis 11/22/20  Breast, right, needle core biopsy, 10 cmfn, upper outer quadrant, post depth - INVASIVE DUCTAL CARCINOMA. SEE NOTE Diagnosis Note Carcinoma measures 0.7 cm in greatest linear dimension and appears grade 2. [name] reviewed the case and concurs with the diagnosis. A breast prognostic profile (ER, PR, Ki-67 and HER2) is pending and will be reported in an addendum. [name] was notified on 11/23/2020.   11/22/2020 Receptors her2    ROGNOSTIC INDICATORS Results: IMMUNOHISTOCHEMICAL AND MORPHOMETRIC ANALYSIS PERFORMED MANUALLY The tumor cells are NEGATIVE for Her2 (1+). Estrogen Receptor: 95%, POSITIVE, STRONG STAINING INTENSITY Progesterone Receptor: 95%, POSITIVE, STRONG STAINING INTENSITY Proliferation Marker Ki67: 10%   11/28/2020 Initial Diagnosis   Malignant neoplasm of upper-outer quadrant of right breast in female, estrogen receptor positive (HCC)   12/22/2020 Genetic Testing   Negative hereditary cancer genetic testing: no pathogenic variants detected in [facility] Common Hereditary Cancers Panel + Melanoma Genes.  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  The report date is December 22, 2020.    The Common Hereditary Cancers Panel+Melanoma Genes offered by [facility] includes sequencing and/or deletion duplication testing of the following 52 genes: APC, ATM, AXIN2, BAP1, BARD1, BMPR1A, BRCA1, BRCA2, BRIP1, CDH1, CDK4, CDKN2A (p14ARF), CDKN2A (p16INK4a), CHEK2, CTNNA1, DICER1, EPCAM (Deletion/duplication testing only), GREM1 (promoter region deletion/duplication testing only), HOXB13,  KIT, MEN1, MITF, MLH1, MSH2, MSH3, MSH6, MUTYH, NBN, NF1, NHTL1, PALB2, PDGFRA, PMS2, POLD1, POLE, PTEN, POT1, RAD50, RAD51C, RAD51D, RB1, RNF43, SDHA, SDHB, SDHC, SDHD, SMAD4, SMARCA4, STK11, TP53, TSC1, TSC2, and VHL.  The following genes were evaluated for sequence changes only: SDHA and HOXB13 c.251G>A variant only.   02/18/2021 Surgery   FINAL MICROSCOPIC DIAGNOSIS:   A. BREAST, RIGHT, LUMPECTOMY:  - Invasive ductal carcinoma, 1.2 cm, grade 2  - Ductal carcinoma in situ, intermediate grade  - Resection margins are negative for carcinoma; posterior margin is less than 1 mm from carcinoma  - Biopsy site changes  - See oncology table   B. LYMPH NODE, RIGHT AXILLARY #1, SENTINEL, EXCISION:  - Lymph node, negative for carcinoma (0/1)   C. LYMPH NODE, RIGHT AXILLARY #2, SENTINEL, EXCISION:  - Lymph node, negative for carcinoma (0/1)   D. LYMPH NODE, RIGHT AXILLARY, SENTINEL, EXCISION:  - Lymph node, negative for carcinoma (0/1)   E. LYMPH NODE, RIGHT AXILLARY #3, SENTINEL, EXCISION:  - Lymph node, negative for carcinoma (0/1)   F. LYMPH NODE, RIGHT AXILLARY, SENTINEL, EXCISION:  - Lymph node, negative for carcinoma (0/1)     02/18/2021 Oncotype testing   Oncotype DX was obtained on the final surgical sample and the recurrence score of 0 predicts a risk of recurrence outside the breast over the next 9 years of 3%, if the patient's only systemic therapy is an antiestrogen for 5 years.  It also predicts no benefit from chemotherapy.   02/27/2021 Cancer Staging   Staging form: Breast, AJCC 8th Edition - Pathologic: Stage IA (pT1c, pN0, cM0, G2, ER+, PR+, HER2-) - Signed by [name], NP on  02/27/2021 Histologic grading system: 3 grade system   03/2021 - 04/2021 Radiation Therapy   Completed adjuvant Radiation by Dr. [name]   05/2021 -  Neo-Adjuvant Anti-estrogen oral therapy   Began tamoxifen  mid July 2022   07/29/2021 Survivorship   SCP delivered by Lacie Burton, NP      Discussed the use of AI scribe software for clinical note transcription with the patient, who gave verbal consent to proceed.  History of Present Illness Kayla Bennett is a 73 year old female who presents for a phone visit to review her CT scan.  She experiences persistent chest discomfort that has not improved over the past two to three weeks. Her recent CT chest scan revealed a small lung nodule and granulomatous changes. She has not been previously informed of sarcoidosis. The CT scan also showed fatty liver infiltration. She has a family history of liver issues, as her cousin had liver problems. She is under the care of a cardiologist and has not yet seen a pulmonologist, needing a referral for further evaluation.     REVIEW OF SYSTEMS:   Constitutional: Denies fevers, chills or abnormal weight loss Eyes: Denies blurriness of vision Ears, nose, mouth, throat, and face: Denies mucositis or sore throat Respiratory: Denies cough, dyspnea or wheezes Cardiovascular: Denies palpitation, chest discomfort or lower extremity swelling Gastrointestinal:  Denies nausea, heartburn or change in bowel habits Skin: Denies abnormal skin rashes Lymphatics: Denies new lymphadenopathy or easy bruising Neurological:Denies numbness, tingling or new weaknesses Behavioral/Psych: Mood is stable, no new changes  All other systems were reviewed with the patient and are negative.  MEDICAL HISTORY:  Past Medical History:  Diagnosis Date   Allergy 1978   Food allergies, seasonal allergies, medicine allergies   Anemia    Arthritis    Asthma    Blood transfusion without reported diagnosis 1985   Breast cancer (HCC)    Diabetes (HCC)    Dyspnea    Dysrhythmia    afibb on occasion   Family history of adverse reaction to anesthesia    mother had reaction to versed ...during surgery, stopped breathing   Fibromyalgia    GERD (gastroesophageal reflux disease)    Headache    History of hiatal hernia     Hyperlipidemia 1990?   Hypertension    Migraines    Osteopenia    Osteoporosis    Oxygen  deficiency 2023   Psoriasis    Sleep apnea 2023   Yeast infection 10/30/2021    SURGICAL HISTORY: Past Surgical History:  Procedure Laterality Date   ABDOMINAL HYSTERECTOMY     APPENDECTOMY     ATRIAL FIBRILLATION ABLATION N/A 12/10/2021   Procedure: ATRIAL FIBRILLATION ABLATION;  Surgeon: [name], MD;  Location: [facility] IN[facility]ASIVE CV LAB;  Service: Cardiovascular;  Laterality: N/A;   BREAST LUMPECTOMY WITH RADIOACTIVE SEED AND SENTINEL LYMPH NODE BIOPSY Right 02/18/2021   Procedure: RIGHT BREAST LUMPECTOMY WITH RADIOACTIVE SEED AND SENTINEL LYMPH NODE BIOPSY;  Surgeon: [name], MD;  Location: [facility] OR;  Service: General;  Laterality: Right;   BREAST SURGERY  2022   broken wrist     CARDIOVERSION N/A 04/04/2021   Procedure: CARDIOVERSION;  Surgeon: [name], MD;  Location: [facility] ENDOSCOPY;  Service: Cardiovascular;  Laterality: N/A;   CARDIOVERSION N/A 08/14/2021   Procedure: CARDIOVERSION;  Surgeon: Harrold Lincoln, MD;  Location: [facility] ENDOSCOPY;  Service: Cardiovascular;  Laterality: N/A;   CARDIOVERSION N/A 09/19/2021   Procedure: CARDIOVERSION;  Surgeon: Harrold Lincoln, MD;  Location:  [facility] ENDOSCOPY;  Service: Cardiovascular;  Laterality: N/A;   CESAREAN SECTION  1976 and 1977   CHOLECYSTECTOMY     FRACTURE SURGERY     right wrist   HYSTERECTOMY ABDOMINAL WITH SALPINGO-OOPHORECTOMY     LYSIS OF ADHESION     RIGHT/LEFT HEART CATH AND CORONARY ANGIOGRAPHY N/A 02/18/2024   Procedure: RIGHT/LEFT HEART CATH AND CORONARY ANGIOGRAPHY;  Surgeon: Odie Benne, MD;  Location: [facility] INVASIVE CV LAB;  Service: Cardiovascular;  Laterality: N/A;   SHOULDER SURGERY Left    TUBAL LIGATION      I have reviewed the social history and family history with the patient and they are unchanged from previous note.  ALLERGIES:  is allergic to lotensin [benazepril hcl], morphine,  erythromycin base, hydrocodone , other, banana, chocolate, gluten meal, tizanidine, and wound dressing adhesive.  MEDICATIONS:  Current Outpatient Medications  Medication Sig Dispense Refill   acetaminophen  (TYLENOL ) 500 MG tablet Take 500 mg by mouth every 6 (six) hours as needed for moderate pain, headache or mild pain.     albuterol  (VENTOLIN  HFA) 108 (90 Base) MCG/ACT inhaler Inhale 2 puffs into the lungs every 6 (six) hours as needed for wheezing or shortness of breath. 8 g 0   apixaban  (ELIQUIS ) 5 MG TABS tablet TAKE ONE TABLET BY MOUTH TWICE DAILY 180 tablet 1   calcipotriene (DOVONOX) 0.005 % cream Apply 1 application  topically See admin instructions. Mix with fluorouracil 5% cream and apply topically twice daily for five days - as needed for precancerous on right cheek.     Calcium Carbonate (CALCIUM 600 PO) Take 600 mg by mouth daily.     cetirizine (ZYRTEC) 10 MG tablet Take 10 mg by mouth in the morning.     cholecalciferol (VITAMIN D3) 25 MCG (1000 UNIT) tablet Take 1,000 Units by mouth in the morning and at bedtime.     Evolocumab  (REPATHA  SURECLICK) 140 MG/ML SOAJ Inject 140 mg into the skin every 14 (fourteen) days. 2 mL 2   ferrous sulfate 325 (65 FE) MG tablet Take 325 mg by mouth every morning.     fluorouracil (EFUDEX) 5 % cream Apply 1 application  topically 2 (two) times daily as needed (precancerous spots on right temple and left eyebrow). Mix with calcipotriene 0.005% cream and apply topically twice daily for five days - as needed for precancerous on right temple and left eyebrow     furosemide  (LASIX ) 40 MG tablet Take 1 tablet (40 mg total) by mouth daily. 90 tablet 1   gabapentin  (NEURONTIN ) 100 MG capsule TAKE THREE CAPSULES BY MOUTH AT BEDTIME (Patient taking differently: Take 200 mg by mouth at bedtime.) 540 capsule 0   hydrocortisone 2.5 % cream Apply 1 Application topically 2 (two) times daily.     ibuprofen  (ADVIL ) 200 MG tablet Take 200 mg by mouth every 6 (six)  hours as needed for mild pain (pain score 1-3).     losartan  (COZAAR ) 100 MG tablet Take 1 tablet (100 mg total) by mouth daily. 90 tablet 1   metFORMIN  (GLUCOPHAGE -XR) 500 MG 24 hr tablet TAKE ONE TABLET BY MOUTH EVERY DAY (Patient taking differently: Take 500 mg by mouth at bedtime.) 90 tablet 3   metoprolol  succinate (TOPROL -XL) 100 MG 24 hr tablet Take 1 tablet (100 mg total) by mouth daily. Take with or immediately following a meal. 90 tablet 2   pantoprazole  (PROTONIX ) 40 MG tablet TAKE ONE TABLET BY MOUTH TWICE DAILY 180 tablet 0   potassium chloride  SA (  KLOR-CON  M) 20 MEQ tablet Take 1 tablet (20 mEq total) by mouth daily. 30 tablet 0   Prenatal Vit-Fe Fumarate-FA (PRENATAL VITAMIN PLUS LOW IRON PO) Take 1 tablet by mouth in the morning.     tamoxifen  (NOLVADEX ) 20 MG tablet Take 1 tablet (20 mg total) by mouth every morning. 90 tablet 3   triamcinolone (KENALOG) 0.1 % Apply 1 application  topically 2 (two) times daily as needed (psoriasis). Mixed with Cetaphil 1:1     UNABLE TO FIND OUTPATIENT PHYSICAL THERAPY    Diagnosis: Generalized debility due to UTI, acute gastroenteritis 1 Mutually Defined 0   venlafaxine  XR (EFFEXOR -XR) 37.5 MG 24 hr capsule Take 1 capsule (37.5 mg total) by mouth at bedtime. 90 capsule 3   venlafaxine  XR (EFFEXOR -XR) 75 MG 24 hr capsule TAKE ONE CAPSULE BY MOUTH EVERY DAY 90 capsule 0   vitamin C (ASCORBIC ACID) 500 MG tablet Take 500 mg by mouth daily.     No current facility-administered medications for this visit.    PHYSICAL EXAMINATION: Not performed   LABORATORY DATA:  I have reviewed the data as listed    Latest Ref Rng & Units 03/21/2024   12:25 PM 02/18/2024    9:51 AM 02/18/2024    9:49 AM  CBC  WBC 4.0 - 10.5 K/uL 5.8     Hemoglobin 12.0 - 15.0 g/dL 16.1  9.9  09.6   Hematocrit 36.0 - 46.0 % 32.0  29.0  30.0   Platelets 150 - 400 K/uL 163           Latest Ref Rng & Units 03/21/2024   12:25 PM 03/03/2024    9:04 AM 02/18/2024    9:51 AM   CMP  Glucose 70 - 99 mg/dL 045  409    BUN 8 - 23 mg/dL 17  21    Creatinine 8.11 - 1.00 mg/dL 9.14  7.82    Sodium 956 - 145 mmol/L 139  140  142   Potassium 3.5 - 5.1 mmol/L 3.6  4.1  3.7   Chloride 98 - 111 mmol/L 100  100    CO2 22 - 32 mmol/L 31  25    Calcium 8.9 - 10.3 mg/dL 9.0  9.0    Total Protein 6.5 - 8.1 g/dL 7.4  7.2    Total Bilirubin 0.0 - 1.2 mg/dL 0.4  0.3    Alkaline Phos 38 - 126 U/L 67  82    AST 15 - 41 U/L 69  88    ALT 0 - 44 U/L 31  38        RADIOGRAPHIC STUDIES: I have personally reviewed the radiological images as listed and agreed with the findings in the report. No results found.     I discussed the assessment and treatment plan with the patient. The patient was provided an opportunity to ask questions and all were answered. The patient agreed with the plan and demonstrated an understanding of the instructions.   The patient was advised to call back or seek an in-person evaluation if the symptoms worsen or if the condition fails to improve as anticipated.  I provided 25 minutes of non face-to-face telephone visit time during this encounter, including review of chart and various tests results, discussions about plan of care and coordination of care plan.    Sonja Matthews, MD 04/06/24

## 2024-04-07 ENCOUNTER — Encounter: Payer: Self-pay | Admitting: Family Medicine

## 2024-04-07 ENCOUNTER — Ambulatory Visit (INDEPENDENT_AMBULATORY_CARE_PROVIDER_SITE_OTHER): Payer: Medicare Other | Admitting: Family Medicine

## 2024-04-07 VITALS — BP 134/75 | HR 70 | Ht 61.0 in | Wt 205.0 lb

## 2024-04-07 DIAGNOSIS — R2689 Other abnormalities of gait and mobility: Secondary | ICD-10-CM | POA: Diagnosis not present

## 2024-04-07 DIAGNOSIS — I10 Essential (primary) hypertension: Secondary | ICD-10-CM

## 2024-04-07 DIAGNOSIS — D6869 Other thrombophilia: Secondary | ICD-10-CM | POA: Diagnosis not present

## 2024-04-07 DIAGNOSIS — I4819 Other persistent atrial fibrillation: Secondary | ICD-10-CM

## 2024-04-07 DIAGNOSIS — E119 Type 2 diabetes mellitus without complications: Secondary | ICD-10-CM | POA: Diagnosis not present

## 2024-04-07 DIAGNOSIS — E1169 Type 2 diabetes mellitus with other specified complication: Secondary | ICD-10-CM | POA: Diagnosis not present

## 2024-04-07 DIAGNOSIS — E669 Obesity, unspecified: Secondary | ICD-10-CM

## 2024-04-07 DIAGNOSIS — R42 Dizziness and giddiness: Secondary | ICD-10-CM | POA: Diagnosis not present

## 2024-04-07 LAB — POCT GLYCOSYLATED HEMOGLOBIN (HGB A1C): HbA1c, POC (controlled diabetic range): 6.3 % (ref 0.0–7.0)

## 2024-04-07 LAB — POCT UA - MICROALBUMIN
Albumin/Creatinine Ratio, Urine, POC: <30
Creatinine, POC: 200 mg/dL
Microalbumin Ur, POC: 30 mg/L

## 2024-04-07 MED ORDER — POTASSIUM CHLORIDE CRYS ER 20 MEQ PO TBCR: 20.0000 meq | EXTENDED_RELEASE_TABLET | Freq: Every day | ORAL | 1 refills | Status: DC

## 2024-04-07 NOTE — Assessment & Plan Note (Signed)
 Diabetes remains very well-controlled.  She will continue on metformin.  Dietary changes encouraged.

## 2024-04-07 NOTE — Assessment & Plan Note (Signed)
BP is well controlled.  Continue losartan at current strength.   

## 2024-04-07 NOTE — Assessment & Plan Note (Signed)
 She remains on eliquis and is tolerating well

## 2024-04-07 NOTE — Assessment & Plan Note (Signed)
 Still with some balance issues.  Referral to vestibular therapy to see if this is helpful for her.

## 2024-04-07 NOTE — Assessment & Plan Note (Signed)
 Rate is better controlled with increase in metoprolol.  BP remains stable.  She will continue to see cardiology.  Remains anticoagulated with eliquis.

## 2024-04-07 NOTE — Progress Notes (Signed)
 Kayla Bennett - 73 y.o. female MRN [id number]  Date of birth: 08-03-1951  Subjective Chief Complaint  Patient presents with   Hypertension   Diabetes    HPI Kayla Bennett is a 73 y.o. female here today for follow up visit.   She is seeing cardiology and pulmonology for exertional dyspnea.  She has had work up including R and L heart cath and Echo. Mild non-obstructive CAD noted.  Treated for volume overload with lasix .   She is on Repatha  2/2 to statin intolerance.    Her BP is well controlled with current medications.  Hydrochlorothiazide  was d/c after lasix  was added by cardiology.  She has remained on losartan  and metoprolol .  History of PAF s/p ablation.  She remains anticoagulated with Eliquis .   Diabetes managed with metformin .  A1c is 6.3%  Chronic pain is stable with Effexor .   Taking 75mg  +37.5mg  capsule.   ROS:  A comprehensive ROS was completed and negative except as noted per HPI  Allergies  Allergen Reactions   Lotensin [Benazepril Hcl] Swelling   Morphine Itching and Rash   Erythromycin Base Other (See Comments)    Unknown reaction   Hydrocodone  Other (See Comments)    Unknown reaction     Other Swelling    Mustard - vaginal swelling/pain    Banana Swelling    vaginal swelling/pain   Chocolate Swelling    vaginal swelling/pain   Gluten Meal Diarrhea and Other (See Comments)    Upset stomach    Tizanidine Other (See Comments)    Lowered blood pressure    Wound Dressing Adhesive Other (See Comments)    Skin burns, blisters     Past Medical History:  Diagnosis Date   Allergy 1978   Food allergies, seasonal allergies, medicine allergies   Anemia    Arthritis    Asthma    Blood transfusion without reported diagnosis 1985   Breast cancer (HCC)    Diabetes (HCC)    Dyspnea    Dysrhythmia    afibb on occasion   Family history of adverse reaction to anesthesia    mother had reaction to versed ...during surgery, stopped breathing   Fibromyalgia     GERD (gastroesophageal reflux disease)    Headache    History of hiatal hernia    Hyperlipidemia 1990?   Hypertension    Migraines    Osteopenia    Osteoporosis    Oxygen  deficiency 2023   Psoriasis    Sleep apnea 2023   Yeast infection 10/30/2021    Past Surgical History:  Procedure Laterality Date   ABDOMINAL HYSTERECTOMY     APPENDECTOMY     ATRIAL FIBRILLATION ABLATION N/A 12/10/2021   Procedure: ATRIAL FIBRILLATION ABLATION;  Surgeon: [name], MD;  Location: [facility];  Service: Cardiovascular;  Laterality: N/A;   BREAST LUMPECTOMY WITH RADIOACTIVE SEED AND SENTINEL LYMPH NODE BIOPSY Right 02/18/2021   Procedure: RIGHT BREAST LUMPECTOMY WITH RADIOACTIVE SEED AND SENTINEL LYMPH NODE BIOPSY;  Surgeon: [name], MD;  Location: [facility] OR;  Service: General;  Laterality: Right;   BREAST SURGERY  2022   broken wrist     CARDIOVERSION N/A 04/04/2021   Procedure: CARDIOVERSION;  Surgeon: [name], MD;  Location: [facility] ENDOSCOPY;  Service: Cardiovascular;  Laterality: N/A;   CARDIOVERSION N/A 08/14/2021   Procedure: CARDIOVERSION;  Surgeon: Harrold Lincoln, MD;  Location: [facility] ENDOSCOPY;  Service: Cardiovascular;  Laterality: N/A;   CARDIOVERSION N/A 09/19/2021   Procedure: CARDIOVERSION;  Surgeon: Harrold Lincoln, MD;  Location: [facility] ENDOSCOPY;  Service: Cardiovascular;  Laterality: N/A;   CESAREAN SECTION  1976 and 1977   CHOLECYSTECTOMY     FRACTURE SURGERY     right wrist   HYSTERECTOMY ABDOMINAL WITH SALPINGO-OOPHORECTOMY     LYSIS OF ADHESION     RIGHT/LEFT HEART CATH AND CORONARY ANGIOGRAPHY N/A 02/18/2024   Procedure: RIGHT/LEFT HEART CATH AND CORONARY ANGIOGRAPHY;  Surgeon: Odie Benne, MD;  Location: [facility];  Service: Cardiovascular;  Laterality: N/A;   SHOULDER SURGERY Left    TUBAL LIGATION      Social History   Socioeconomic History   Marital status: Married    Spouse name: Not on file   Number of  children: 2   Years of education: Not on file   Highest education level: Some college, no degree  Occupational History   Not on file  Tobacco Use   Smoking status: Never   Smokeless tobacco: Never  Vaping Use   Vaping status: Never Used  Substance and Sexual Activity   Alcohol use: Never   Drug use: Never   Sexual activity: Not Currently  Other Topics Concern   Not on file  Social History Narrative   Not on file   Social Drivers of Health   Financial Resource Strain: Low Risk  (04/06/2024)   Overall Financial Resource Strain (CARDIA)    Difficulty of Paying Living Expenses: Not hard at all  Food Insecurity: No Food Insecurity (04/06/2024)   Hunger Vital Sign    Worried About Running Out of Food in the Last Year: Never true    Ran Out of Food in the Last Year: Never true  Transportation Needs: No Transportation Needs (04/06/2024)   PRAPARE - Administrator, Civil Service (Medical): No    Lack of Transportation (Non-Medical): No  Physical Activity: Inactive (04/06/2024)   Exercise Vital Sign    Days of Exercise per Week: 0 days    Minutes of Exercise per Session: 0 min  Stress: No Stress Concern Present (04/06/2024)   [employer] of Occupational Health - Occupational Stress Questionnaire    Feeling of Stress : Not at all  Social Connections: Moderately Isolated (04/06/2024)   Social Connection and Isolation Panel [NHANES]    Frequency of Communication with Friends and Family: More than three times a week    Frequency of Social Gatherings with Friends and Family: Once a week    Attends Religious Services: Never    Database administrator or Organizations: No    Attends Engineer, structural: Never    Marital Status: Married    Family History  Problem Relation Age of Onset   Atrial fibrillation Mother    Heart attack Mother 60   Arthritis Mother    Cancer Mother    Diabetes Mother    Heart disease Mother    Hypertension Mother    Colon cancer  Father 59   Heart attack Father 38   Cancer Father    Early death Father    Heart disease Father    Hypertension Father    Heart attack Paternal Grandmother 60   Cancer Maternal Uncle        melanoma    Colon cancer Paternal Uncle    Cancer Paternal Uncle    Diabetes Paternal Uncle    Heart disease Paternal Uncle    Obesity Paternal Uncle    Colon cancer Cousin    Atrial fibrillation  Cousin    Stroke Maternal Grandmother    Stroke Paternal Grandfather    Arthritis Maternal Aunt    Diabetes Maternal Aunt    Hearing loss Maternal Aunt    Cancer Paternal Uncle    Heart disease Paternal Uncle    Obesity Paternal Uncle    Diabetes Maternal Aunt    Diabetes Maternal Aunt    Heart disease Maternal Aunt    Diabetes Maternal Uncle    Hearing loss Maternal Uncle    Diabetes Paternal Aunt    Hearing loss Paternal Aunt    Diabetes Sister    Hyperlipidemia Sister    Hypertension Sister    Diabetes Paternal Uncle    Hearing loss Paternal Uncle    Diabetes Paternal Uncle    Hearing loss Paternal Uncle    Heart disease Paternal Uncle    Hypertension Paternal Uncle    Diabetes Paternal Uncle    Stroke Maternal Uncle     Health Maintenance  Topic Date Due   OPHTHALMOLOGY EXAM  06/06/2023   Diabetic kidney evaluation - Urine ACR  04/07/2024   Zoster Vaccines- Shingrix (1 of 2) 07/08/2024 (Originally 02/17/1970)   Hepatitis C Screening  04/07/2025 (Originally 02/17/1969)   COVID-19 Vaccine (3 - 2024-25 season) 04/23/2025 (Originally 07/19/2023)   DTaP/Tdap/Td (2 - Td or Tdap) 05/31/2024   INFLUENZA VACCINE  06/17/2024   FOOT EXAM  10/08/2024   HEMOGLOBIN A1C  10/08/2024   MAMMOGRAM  11/04/2024   Medicare Annual Wellness (AWV)  11/30/2024   Diabetic kidney evaluation - eGFR measurement  03/21/2025   Colonoscopy  04/19/2029   Pneumonia Vaccine 97+ Years old  Completed   DEXA SCAN  Completed   HPV VACCINES  Aged Out   Meningococcal B Vaccine  Aged Out      ----------------------------------------------------------------------------------------------------------------------------------------------------------------------------------------------------------------- Physical Exam BP 134/75 (BP Location: Left Arm, Patient Position: Sitting, Cuff Size: Normal)   Pulse 70   Ht 5\' 1"  (1.549 m)   Wt 205 lb (93 kg)   SpO2 95%   BMI 38.73 kg/m   Physical Exam Constitutional:      Appearance: Normal appearance.  HENT:     Head: Normocephalic and atraumatic.  Eyes:     General: No scleral icterus. Cardiovascular:     Rate and Rhythm: Normal rate and regular rhythm.  Neurological:     Mental Status: She is alert.  Psychiatric:        Mood and Affect: Mood normal.        Behavior: Behavior normal.     ------------------------------------------------------------------------------------------------------------------------------------------------------------------------------------------------------------------- Assessment and Plan  Balance problem Still with some balance issues.  Referral to vestibular therapy to see if this is helpful for her.   Hypertension BP is well controlled.  Continue losartan  at current strength.   Type 2 diabetes mellitus with obesity (HCC) Diabetes remains very well-controlled.  She will continue on metformin .  Dietary changes encouraged.  Secondary hypercoagulable state (HCC) She remains on eliquis  and is tolerating well  Persistent atrial fibrillation (HCC) Rate is better controlled with increase in metoprolol .  BP remains stable.  She will continue to see cardiology.  Remains anticoagulated with eliquis .    Meds ordered this encounter  Medications   potassium chloride  SA (KLOR-CON  M) 20 MEQ tablet    Sig: Take 1 tablet (20 mEq total) by mouth daily.    Dispense:  90 tablet    Refill:  1    Return in about 6 months (around 10/08/2024) for Hypertension, Type 2 Diabetes.

## 2024-04-08 ENCOUNTER — Other Ambulatory Visit: Payer: Self-pay

## 2024-04-08 DIAGNOSIS — Z170 Estrogen receptor positive status [ER+]: Secondary | ICD-10-CM

## 2024-04-08 NOTE — Progress Notes (Signed)
 One time Signatera ordered per verbal order w/readback from Dr. Maryalice Smaller.  Order placed in EPIC and online.  Requisition and blood kit given to [facility] in [place].  Scheduling message sent today to Littie Rife to contact pt for lab appt next week or week afterwards for Signatera.

## 2024-04-12 ENCOUNTER — Telehealth: Payer: Self-pay | Admitting: Hematology

## 2024-04-12 ENCOUNTER — Other Ambulatory Visit: Payer: Self-pay

## 2024-04-12 DIAGNOSIS — Z170 Estrogen receptor positive status [ER+]: Secondary | ICD-10-CM

## 2024-04-13 ENCOUNTER — Inpatient Hospital Stay

## 2024-04-13 DIAGNOSIS — Z170 Estrogen receptor positive status [ER+]: Secondary | ICD-10-CM

## 2024-04-13 LAB — GENETIC SCREENING ORDER

## 2024-04-19 ENCOUNTER — Ambulatory Visit: Admitting: Pharmacist

## 2024-04-19 ENCOUNTER — Telehealth: Payer: Self-pay | Admitting: Pharmacist

## 2024-04-19 DIAGNOSIS — E782 Mixed hyperlipidemia: Secondary | ICD-10-CM

## 2024-04-19 NOTE — Progress Notes (Deleted)
 Patient ID: Kayla Bennett                 DOB: [birth year]                    MRN: [id number]      HPI: Kayla Bennett is a 73 y.o. female patient referred to lipid clinic by  ***. PMH is significant for  mild coronary artery disease seen on recent cardiac catheterization, DM, HTN, HLD, afib   Reviewed options for lowering LDL cholesterol, including ezetimibe, PCSK-9 inhibitors, bempedoic acid and inclisiran.  Discussed mechanisms of action, dosing, side effects and potential decreases in LDL cholesterol.  Also reviewed cost information and potential options for patient assistance.  Current Medications:  Intolerances:  Risk Factors:  LDL-C goal:  ApoB goal:   Diet:   Exercise:   Family History:   Social History:   Labs: Lipid Panel  LP(a) <8.4    Component Value Date/Time   CHOL 198 02/10/2024 0920   TRIG 142 02/10/2024 0920   HDL 64 02/10/2024 0920   CHOLHDL 3.1 02/10/2024 0920   LDLCALC 109 (H) 02/10/2024 0920   LABVLDL 25 02/10/2024 0920    Past Medical History:  Diagnosis Date   Allergy 1978   Food allergies, seasonal allergies, medicine allergies   Anemia    Arthritis    Asthma    Blood transfusion without reported diagnosis 1985   Breast cancer (HCC)    Diabetes (HCC)    Dyspnea    Dysrhythmia    afibb on occasion   Family history of adverse reaction to anesthesia    mother had reaction to versed ...during surgery, stopped breathing   Fibromyalgia    GERD (gastroesophageal reflux disease)    Headache    History of hiatal hernia    Hyperlipidemia 1990?   Hypertension    Migraines    Osteopenia    Osteoporosis    Oxygen  deficiency 2023   Psoriasis    Sleep apnea 2023   Yeast infection 10/30/2021    Current Outpatient Medications on File Prior to Visit  Medication Sig Dispense Refill   acetaminophen  (TYLENOL ) 500 MG tablet Take 500 mg by mouth every 6 (six) hours as needed for moderate pain, headache or mild pain.     albuterol  (VENTOLIN   HFA) 108 (90 Base) MCG/ACT inhaler Inhale 2 puffs into the lungs every 6 (six) hours as needed for wheezing or shortness of breath. 8 g 0   apixaban  (ELIQUIS ) 5 MG TABS tablet TAKE ONE TABLET BY MOUTH TWICE DAILY 180 tablet 1   calcipotriene (DOVONOX) 0.005 % cream Apply 1 application  topically See admin instructions. Mix with fluorouracil 5% cream and apply topically twice daily for five days - as needed for precancerous on right cheek.     Calcium Carbonate (CALCIUM 600 PO) Take 600 mg by mouth daily.     cephALEXin  (KEFLEX ) 500 MG capsule Take 500 mg by mouth 3 (three) times daily.     cetirizine (ZYRTEC) 10 MG tablet Take 10 mg by mouth in the morning.     cholecalciferol (VITAMIN D3) 25 MCG (1000 UNIT) tablet Take 1,000 Units by mouth in the morning and at bedtime.     Evolocumab  (REPATHA  SURECLICK) 140 MG/ML SOAJ Inject 140 mg into the skin every 14 (fourteen) days. 2 mL 2   ferrous sulfate 325 (65 FE) MG tablet Take 325 mg by mouth every morning.     fluconazole  (DIFLUCAN ) 150 MG tablet  Take 150 mg by mouth every 3 (three) days.     fluorouracil (EFUDEX) 5 % cream Apply 1 application  topically 2 (two) times daily as needed (precancerous spots on right temple and left eyebrow). Mix with calcipotriene 0.005% cream and apply topically twice daily for five days - as needed for precancerous on right temple and left eyebrow     furosemide  (LASIX ) 40 MG tablet Take 1 tablet (40 mg total) by mouth daily. 90 tablet 1   gabapentin  (NEURONTIN ) 100 MG capsule TAKE THREE CAPSULES BY MOUTH AT BEDTIME (Patient taking differently: Take 200 mg by mouth at bedtime.) 540 capsule 0   hydrocortisone 2.5 % cream Apply 1 Application topically 2 (two) times daily.     ibuprofen  (ADVIL ) 200 MG tablet Take 200 mg by mouth every 6 (six) hours as needed for mild pain (pain score 1-3).     losartan  (COZAAR ) 100 MG tablet Take 1 tablet (100 mg total) by mouth daily. 90 tablet 1   metFORMIN  (GLUCOPHAGE -XR) 500 MG 24 hr  tablet TAKE ONE TABLET BY MOUTH EVERY DAY (Patient taking differently: Take 500 mg by mouth at bedtime.) 90 tablet 3   metoprolol  succinate (TOPROL -XL) 100 MG 24 hr tablet Take 1 tablet (100 mg total) by mouth daily. Take with or immediately following a meal. 90 tablet 2   pantoprazole  (PROTONIX ) 40 MG tablet TAKE ONE TABLET BY MOUTH TWICE DAILY 180 tablet 0   phenazopyridine (PYRIDIUM) 100 MG tablet Take 100 mg by mouth 2 (two) times daily.     potassium chloride  SA (KLOR-CON  M) 20 MEQ tablet Take 1 tablet (20 mEq total) by mouth daily. 90 tablet 1   Prenatal Vit-Fe Fumarate-FA (PRENATAL VITAMIN PLUS LOW IRON PO) Take 1 tablet by mouth in the morning.     tamoxifen  (NOLVADEX ) 20 MG tablet Take 1 tablet (20 mg total) by mouth every morning. 90 tablet 3   triamcinolone (KENALOG) 0.1 % Apply 1 application  topically 2 (two) times daily as needed (psoriasis). Mixed with Cetaphil 1:1     UNABLE TO FIND OUTPATIENT PHYSICAL THERAPY    Diagnosis: Generalized debility due to UTI, acute gastroenteritis 1 Mutually Defined 0   venlafaxine  XR (EFFEXOR -XR) 37.5 MG 24 hr capsule Take 1 capsule (37.5 mg total) by mouth at bedtime. 90 capsule 3   venlafaxine  XR (EFFEXOR -XR) 75 MG 24 hr capsule TAKE ONE CAPSULE BY MOUTH EVERY DAY 90 capsule 0   vitamin C (ASCORBIC ACID) 500 MG tablet Take 500 mg by mouth daily.     No current facility-administered medications on file prior to visit.    Allergies  Allergen Reactions   Lotensin [Benazepril Hcl] Swelling   Morphine Itching and Rash   Erythromycin Base Other (See Comments)    Unknown reaction   Hydrocodone  Other (See Comments)    Unknown reaction     Other Swelling    Mustard - vaginal swelling/pain    Banana Swelling    vaginal swelling/pain   Chocolate Swelling    vaginal swelling/pain   Gluten Meal Diarrhea and Other (See Comments)    Upset stomach    Tizanidine Other (See Comments)    Lowered blood pressure    Wound Dressing Adhesive Other  (See Comments)    Skin burns, blisters     Assessment/Plan:  1. Hyperlipidemia -  No problem-specific Assessment & Plan notes found for this encounter.    Thank you,  Annia Gomm D Lyrica Mcclarty, Pharm.D, BCACP, CPP [facility] A Division of  [facility] [address], [place], [place] [address]  Phone: [phone]; Fax: [phone]

## 2024-04-19 NOTE — Telephone Encounter (Signed)
 Patient was scheduled to see Pharm.D. today for Repatha .  Patient has already started Repatha  and given 3 injections.  Doing well.  No need for appointment.  Will order labs for her to get after 6 injections.  Will cancel Pharm.D. appointment for today.

## 2024-04-25 ENCOUNTER — Other Ambulatory Visit: Payer: Self-pay | Admitting: Family Medicine

## 2024-05-02 ENCOUNTER — Other Ambulatory Visit: Payer: Self-pay | Admitting: Adult Health

## 2024-05-02 DIAGNOSIS — I480 Paroxysmal atrial fibrillation: Secondary | ICD-10-CM

## 2024-05-02 NOTE — Telephone Encounter (Signed)
 Prescription refill request for Eliquis  received. Indication: Afib  Last office visit: 03/03/24 (Tobb) Scr: 0.80 (03/21/24)  Age: 73  Weight: 93kg  Appropriate dose. Refill sent.

## 2024-05-03 LAB — SIGNATERA
SIGNATERA MTM READOUT: 0 MTM/ml
SIGNATERA TEST RESULT: NEGATIVE

## 2024-05-04 ENCOUNTER — Other Ambulatory Visit: Payer: Self-pay

## 2024-05-10 ENCOUNTER — Encounter (HOSPITAL_COMMUNITY): Payer: Self-pay

## 2024-05-11 ENCOUNTER — Ambulatory Visit: Payer: Self-pay | Admitting: Hematology

## 2024-05-11 NOTE — Telephone Encounter (Addendum)
 Called patient to relay message below as per Dr. Lanny. Patient had no further questions at this time.    ----- Message from Onita Lanny sent at 05/11/2024  7:28 AM EDT ----- Please let pt know the negative results, thanks   Onita Lanny  ----- Message ----- From: Iva President Sent: 05/10/2024   4:17 PM EDT To: Onita Lanny, MD

## 2024-05-14 ENCOUNTER — Other Ambulatory Visit: Payer: Self-pay | Admitting: Family Medicine

## 2024-05-22 ENCOUNTER — Other Ambulatory Visit: Payer: Self-pay | Admitting: Cardiology

## 2024-05-27 ENCOUNTER — Other Ambulatory Visit: Payer: Self-pay | Admitting: Family Medicine

## 2024-06-07 ENCOUNTER — Telehealth: Payer: Self-pay

## 2024-06-07 DIAGNOSIS — Z1211 Encounter for screening for malignant neoplasm of colon: Secondary | ICD-10-CM | POA: Diagnosis not present

## 2024-06-07 DIAGNOSIS — Z800 Family history of malignant neoplasm of digestive organs: Secondary | ICD-10-CM | POA: Diagnosis not present

## 2024-06-07 DIAGNOSIS — K219 Gastro-esophageal reflux disease without esophagitis: Secondary | ICD-10-CM | POA: Diagnosis not present

## 2024-06-07 DIAGNOSIS — R194 Change in bowel habit: Secondary | ICD-10-CM | POA: Diagnosis not present

## 2024-06-07 DIAGNOSIS — D509 Iron deficiency anemia, unspecified: Secondary | ICD-10-CM | POA: Diagnosis not present

## 2024-06-07 DIAGNOSIS — Z860100 Personal history of colon polyps, unspecified: Secondary | ICD-10-CM | POA: Diagnosis not present

## 2024-06-07 NOTE — Telephone Encounter (Signed)
 Patient with diagnosis of A Fib on Eliquis  for anticoagulation.    Procedure: colonoscopy Date of procedure: 07/15/24   CHA2DS2-VASc Score = 4  This indicates a 4.8% annual risk of stroke. The patient's score is based upon: CHF History: 0 HTN History: 1 Diabetes History: 1 Stroke History: 0 Vascular Disease History: 0 Age Score: 1 Gender Score: 1    CrCl 92 ml/min Platelet count 163K   Per office protocol, patient can hold Eliquis  for 2 days prior to procedure.    **This guidance is not considered finalized until pre-operative APP has relayed final recommendations.**

## 2024-06-07 NOTE — Telephone Encounter (Signed)
   Pre-operative Risk Assessment    Patient Name: Kayla Bennett  DOB: [birth year] MRN: [id number]   Date of last office visit: 03/03/24 Date of next office visit: n/a   Request for Surgical Clearance    Procedure:  Colonoscopy   Date of Surgery:  Clearance 07/15/24                                 Surgeon:  Dr. Rollin Socks Group or Practice Name:  [facility]  Phone number:  [phone] Fax number:  [phone]   Type of Clearance Requested:   - Medical  - Pharmacy:  Hold Apixaban  (Eliquis ) Not indicated    Type of Anesthesia:  propofol     Additional requests/questions:    Bonney Rebeca Blight   06/07/2024, 11:19 AM

## 2024-06-07 NOTE — Telephone Encounter (Signed)
 1st attempt to reach pt regarding surgical clearance and the need for an TELE appointment.  Left pt a detailed message to call back and get that scheduled.

## 2024-06-07 NOTE — Telephone Encounter (Signed)
 Primary Cardiologist:Kardie Tobb, DO   Preoperative team, please contact this patient and set up a phone call appointment for further preoperative risk assessment. Please obtain consent and complete medication review. Thank you for your help.   I confirm that guidance regarding antiplatelet and oral anticoagulation therapy has been completed and, if necessary, noted below.  Per office protocol, patient can hold Eliquis  for 2 days prior to procedure.     I also confirmed the patient resides in the state of [place] . As per [facility] telemedicine laws, the patient must reside in the state in which the provider is licensed.   Rosaline EMERSON Bane, NP-C 06/07/2024, 4:33 PM [address], [address] [place], [place] [address] Office [phone] Fax [phone]

## 2024-06-08 ENCOUNTER — Other Ambulatory Visit: Payer: Self-pay | Admitting: Gastroenterology

## 2024-06-08 NOTE — Telephone Encounter (Signed)
 2nd attempt contacting patient to schedule TELEVISIT no answer left a detailed vm to call back and schedule

## 2024-06-09 NOTE — Telephone Encounter (Signed)
 3rd attempt. Left message for pt to call our office and ask for the preop team to schedule TELE Preop Appt.

## 2024-06-10 ENCOUNTER — Encounter: Payer: Self-pay | Admitting: Cardiology

## 2024-06-23 NOTE — Telephone Encounter (Signed)
  Patient Consent for Virtual Visit        Kayla Bennett has provided verbal consent on 06/23/2024 for a virtual visit (video or telephone).   CONSENT FOR VIRTUAL VISIT FOR:  Kayla Bennett  By participating in this virtual visit I agree to the following:  I hereby voluntarily request, consent and authorize [facility] and its employed or contracted physicians, physician assistants, nurse practitioners or other licensed health care professionals (the Practitioner), to provide me with telemedicine health care services (the "Services) as deemed necessary by the treating Practitioner. I acknowledge and consent to receive the Services by the Practitioner via telemedicine. I understand that the telemedicine visit will involve communicating with the Practitioner through live audiovisual communication technology and the disclosure of certain medical information by electronic transmission. I acknowledge that I have been given the opportunity to request an in-person assessment or other available alternative prior to the telemedicine visit and am voluntarily participating in the telemedicine visit.  I understand that I have the right to withhold or withdraw my consent to the use of telemedicine in the course of my care at any time, without affecting my right to future care or treatment, and that the Practitioner or I may terminate the telemedicine visit at any time. I understand that I have the right to inspect all information obtained and/or recorded in the course of the telemedicine visit and may receive copies of available information for a reasonable fee.  I understand that some of the potential risks of receiving the Services via telemedicine include:  Delay or interruption in medical evaluation due to technological equipment failure or disruption; Information transmitted may not be sufficient (e.g. poor resolution of images) to allow for appropriate medical decision making by the  Practitioner; and/or  In rare instances, security protocols could fail, causing a breach of personal health information.  Furthermore, I acknowledge that it is my responsibility to provide information about my medical history, conditions and care that is complete and accurate to the best of my ability. I acknowledge that Practitioner's advice, recommendations, and/or decision may be based on factors not within their control, such as incomplete or inaccurate data provided by me or distortions of diagnostic images or specimens that may result from electronic transmissions. I understand that the practice of medicine is not an exact science and that Practitioner makes no warranties or guarantees regarding treatment outcomes. I acknowledge that a copy of this consent can be made available to me via my patient portal [facility] MyChart), or I can request a printed copy by calling the office of [facility].    I understand that my insurance will be billed for this visit.   I have read or had this consent read to me. I understand the contents of this consent, which adequately explains the benefits and risks of the Services being provided via telemedicine.  I have been provided ample opportunity to ask questions regarding this consent and the Services and have had my questions answered to my satisfaction. I give my informed consent for the services to be provided through the use of telemedicine in my medical care

## 2024-06-23 NOTE — Telephone Encounter (Signed)
 Patient is returning call to schedule virtual appointment.

## 2024-06-23 NOTE — Telephone Encounter (Signed)
 Patient is scheduled for pre-op clearance 06/24/24 with Orren Fabry PA-C.

## 2024-06-24 ENCOUNTER — Ambulatory Visit: Attending: Cardiology

## 2024-06-24 DIAGNOSIS — Z01810 Encounter for preprocedural cardiovascular examination: Secondary | ICD-10-CM

## 2024-06-24 NOTE — Progress Notes (Signed)
 Virtual Visit via Telephone Note   Because of Rudy Domek Pisarski co-morbid illnesses, she is at least at moderate risk for complications without adequate follow up.  This format is felt to be most appropriate for this patient at this time.  Due to technical limitations with video connection (technology), today's appointment will be conducted as an audio only telehealth visit, and Najai Waszak Cliburn verbally agreed to proceed in this manner.   All issues noted in this document were discussed and addressed.  No physical exam could be performed with this format.  Evaluation Performed:  Preoperative cardiovascular risk assessment _____________   Date:  06/24/2024   Patient ID:  MARJO GROSVENOR, DOB 27-Jan-1951, MRN [id number] Patient Location:  Home Provider location:   Office  Primary Care Provider:  Alvia Bring, DO Primary Cardiologist:  Kardie Tobb, DO  Chief Complaint / Patient Profile   73 y.o. y/o female with a h/o mild CAD, asthma, diabetes, hyperlipidemia, hypertension, sleep apnea who is pending colonoscopy/endoscopy and presents today for telephonic preoperative cardiovascular risk assessment.  History of Present Illness    LAURIEL HELIN is a 73 y.o. female who presents via audio/video conferencing for a telehealth visit today.  Pt was last seen in cardiology clinic on 03/03/24 by Dr. Sheena.  At that time PEGGY LOGE was doing well other than some continued shortness of breath and chest pain.  The patient is now pending procedure as outlined above. Since her last visit, she  has been doing pretty good but sometimes when she walks she has some tightness in her chest. She was sent for PFTs which have been okay. Sometimes she has SOB walking uphill.  None of the symptoms are new.  We reviewed her cardiac catheterization which revealed mild CAD.  We also reviewed her echocardiogram which revealed mild diastolic dysfunction.  Dr. Sheena did not think that her chest tightness was  coming from coronary disease at her last office visit.   Per office protocol, patient can hold Eliquis  for 2 days prior to procedure.  Please resume when medically safe to do so.  Past Medical History    Past Medical History:  Diagnosis Date   Allergy 1978   Food allergies, seasonal allergies, medicine allergies   Anemia    Arthritis    Asthma    Blood transfusion without reported diagnosis 1985   Breast cancer (HCC)    Diabetes (HCC)    Dyspnea    Dysrhythmia    afibb on occasion   Family history of adverse reaction to anesthesia    mother had reaction to versed ...during surgery, stopped breathing   Fibromyalgia    GERD (gastroesophageal reflux disease)    Headache    History of hiatal hernia    Hyperlipidemia 1990?   Hypertension    Migraines    Osteopenia    Osteoporosis    Oxygen  deficiency 2023   Psoriasis    Sleep apnea 2023   Yeast infection 10/30/2021   Past Surgical History:  Procedure Laterality Date   ABDOMINAL HYSTERECTOMY     APPENDECTOMY     ATRIAL FIBRILLATION ABLATION N/A 12/10/2021   Procedure: ATRIAL FIBRILLATION ABLATION;  Surgeon: Inocencio Soyla Lunger, MD;  Location: [facility];  Service: Cardiovascular;  Laterality: N/A;   BREAST LUMPECTOMY WITH RADIOACTIVE SEED AND SENTINEL LYMPH NODE BIOPSY Right 02/18/2021   Procedure: RIGHT BREAST LUMPECTOMY WITH RADIOACTIVE SEED AND SENTINEL LYMPH NODE BIOPSY;  Surgeon: Aron Shoulders, MD;  Location: [facility] OR;  Service: General;  Laterality: Right;   BREAST SURGERY  2022   broken wrist     CARDIOVERSION N/A 04/04/2021   Procedure: CARDIOVERSION;  Surgeon: Shlomo Wilbert SAUNDERS, MD;  Location: [facility] ENDOSCOPY;  Service: Cardiovascular;  Laterality: N/A;   CARDIOVERSION N/A 08/14/2021   Procedure: CARDIOVERSION;  Surgeon: Barbaraann Darryle Ned, MD;  Location: [facility] ENDOSCOPY;  Service: Cardiovascular;  Laterality: N/A;   CARDIOVERSION N/A 09/19/2021   Procedure: CARDIOVERSION;  Surgeon: Barbaraann Darryle Ned, MD;   Location: [facility] ENDOSCOPY;  Service: Cardiovascular;  Laterality: N/A;   CESAREAN SECTION  1976 and 1977   CHOLECYSTECTOMY     FRACTURE SURGERY     right wrist   HYSTERECTOMY ABDOMINAL WITH SALPINGO-OOPHORECTOMY     LYSIS OF ADHESION     RIGHT/LEFT HEART CATH AND CORONARY ANGIOGRAPHY N/A 02/18/2024   Procedure: RIGHT/LEFT HEART CATH AND CORONARY ANGIOGRAPHY;  Surgeon: Verlin Lonni BIRCH, MD;  Location: [facility];  Service: Cardiovascular;  Laterality: N/A;   SHOULDER SURGERY Left    TUBAL LIGATION      Allergies  Allergies  Allergen Reactions   Lotensin [Benazepril Hcl] Swelling   Morphine Itching and Rash   Erythromycin Base Other (See Comments)    Unknown reaction   Hydrocodone  Other (See Comments)    Unknown reaction     Other Swelling    Mustard - vaginal swelling/pain    Banana Swelling    vaginal swelling/pain   Chocolate Swelling    vaginal swelling/pain   Gluten Meal Diarrhea and Other (See Comments)    Upset stomach    Tizanidine Other (See Comments)    Lowered blood pressure    Wound Dressing Adhesive Other (See Comments)    Skin burns, blisters     Home Medications    Prior to Admission medications   Medication Sig Start Date End Date Taking? Authorizing Provider  acetaminophen  (TYLENOL ) 500 MG tablet Take 500 mg by mouth every 6 (six) hours as needed for moderate pain, headache or mild pain.    [provider]  albuterol  (VENTOLIN  HFA) 108 (90 Base) MCG/ACT inhaler Inhale 2 puffs into the lungs every 6 (six) hours as needed for wheezing or shortness of breath. 10/09/23   Alvia Bring, DO  calcipotriene (DOVONOX) 0.005 % cream Apply 1 application  topically See admin instructions. Mix with fluorouracil 5% cream and apply topically twice daily for five days - as needed for precancerous on right cheek.    [provider]  Calcium Carbonate (CALCIUM 600 PO) Take 600 mg by mouth daily.    [provider]  cephALEXin  (KEFLEX )  500 MG capsule Take 500 mg by mouth 3 (three) times daily. 02/05/24   [provider]  cetirizine (ZYRTEC) 10 MG tablet Take 10 mg by mouth in the morning.    [provider]  cholecalciferol (VITAMIN D3) 25 MCG (1000 UNIT) tablet Take 1,000 Units by mouth in the morning and at bedtime.    [provider]  ELIQUIS  5 MG TABS tablet TAKE ONE TABLET BY MOUTH TWICE DAILY 05/02/24   Tobb, Kardie, DO  Evolocumab  (REPATHA  SURECLICK) 140 MG/ML SOAJ Inject 140 mg into the skin every 14 (fourteen) days. 06/13/24   Tobb, Kardie, DO  ferrous sulfate 325 (65 FE) MG tablet Take 325 mg by mouth every morning.    [provider]  fluconazole  (DIFLUCAN ) 150 MG tablet Take 150 mg by mouth every 3 (three) days. 02/05/24   [provider]  fluorouracil (EFUDEX) 5 %  cream Apply 1 application  topically 2 (two) times daily as needed (precancerous spots on right temple and left eyebrow). Mix with calcipotriene 0.005% cream and apply topically twice daily for five days - as needed for precancerous on right temple and left eyebrow    [provider]  furosemide  (LASIX ) 40 MG tablet Take 1 tablet (40 mg total) by mouth daily. 02/18/24   Verlin Lonni BIRCH, MD  gabapentin  (NEURONTIN ) 100 MG capsule TAKE THREE CAPSULES BY MOUTH AT BEDTIME 05/18/24   Alvia Bring, DO  hydrocortisone 2.5 % cream Apply 1 Application topically 2 (two) times daily.    [provider]  ibuprofen  (ADVIL ) 200 MG tablet Take 200 mg by mouth every 6 (six) hours as needed for mild pain (pain score 1-3).    [provider]  losartan  (COZAAR ) 100 MG tablet Take 1 tablet (100 mg total) by mouth daily. 02/18/24   Verlin Lonni BIRCH, MD  metFORMIN  (GLUCOPHAGE -XR) 500 MG 24 hr tablet TAKE ONE TABLET BY MOUTH EVERY DAY Patient taking differently: Take 500 mg by mouth at bedtime. 08/03/23   Alvia Bring, DO  metoprolol  succinate (TOPROL -XL) 100 MG 24 hr tablet Take 1 tablet (100 mg total)  by mouth daily. Take with or immediately following a meal. 12/28/23   Alvia Bring, DO  pantoprazole  (PROTONIX ) 40 MG tablet TAKE ONE TABLET BY MOUTH TWICE DAILY 05/30/24   Alvia Bring, DO  phenazopyridine (PYRIDIUM) 100 MG tablet Take 100 mg by mouth 2 (two) times daily. 02/05/24   [provider]  potassium chloride  SA (KLOR-CON  M) 20 MEQ tablet Take 1 tablet (20 mEq total) by mouth daily. 04/07/24   Alvia Bring, DO  Prenatal Vit-Fe Fumarate-FA (PRENATAL VITAMIN PLUS LOW IRON PO) Take 1 tablet by mouth in the morning.    [provider]  tamoxifen  (NOLVADEX ) 20 MG tablet Take 1 tablet (20 mg total) by mouth every morning. 09/22/23   Burton, [name], NP  triamcinolone (KENALOG) 0.1 % Apply 1 application  topically 2 (two) times daily as needed (psoriasis). Mixed with Cetaphil 1:1    [provider]  UNABLE TO FIND OUTPATIENT PHYSICAL THERAPY    Diagnosis: Generalized debility due to UTI, acute gastroenteritis 08/23/22   Rai, Nydia POUR, MD  venlafaxine  XR (EFFEXOR -XR) 37.5 MG 24 hr capsule Take 1 capsule (37.5 mg total) by mouth at bedtime. 07/08/23   Alvia Bring, DO  venlafaxine  XR (EFFEXOR -XR) 75 MG 24 hr capsule TAKE ONE CAPSULE BY MOUTH EVERY DAY 04/25/24   Alvia Bring, DO  vitamin C (ASCORBIC ACID) 500 MG tablet Take 500 mg by mouth daily.    [provider]    Physical Exam    Vital Signs:  Dejae Bernet Rodman does not have vital signs available for review today.  Given telephonic nature of communication, physical exam is limited. AAOx3. NAD. Normal affect.  Speech and respirations are unlabored.  Accessory Clinical Findings    None  Assessment & Plan    1.  Preoperative Cardiovascular Risk Assessment:   Ms. Delisa perioperative risk of a major cardiac event is 0.4% according to the Revised Cardiac Risk Index (RCRI).  Therefore, she is at low risk for perioperative complications.   Her functional capacity is fair at 4.73 METs according  to the Duke Activity Status Index (DASI). Recommendations: According to ACC/AHA guidelines, no further cardiovascular testing needed.  The patient may proceed to surgery at acceptable risk.   Antiplatelet and/or Anticoagulation Recommendations:  Eliquis  (Apixaban ) can be held  for 2 days prior to surgery.  Please resume post op when felt to be safe.     The patient was advised that if she develops new symptoms prior to surgery to contact our office to arrange for a follow-up visit, and she verbalized understanding.   A copy of this note will be routed to requesting surgeon.  Time:   Today, I have spent 8 minutes with the patient with telehealth technology discussing medical history, symptoms, and management plan.     Orren LOISE Fabry, PA-C  06/24/2024, 12:24 PM

## 2024-06-29 ENCOUNTER — Inpatient Hospital Stay (HOSPITAL_COMMUNITY)
Admission: EM | Admit: 2024-06-29 | Discharge: 2024-07-07 | DRG: 184 | Disposition: A | Discharge status: Skilled Nursing Facility | Source: Ambulatory Visit | Attending: Internal Medicine | Admitting: Internal Medicine

## 2024-06-29 ENCOUNTER — Emergency Department (HOSPITAL_COMMUNITY)

## 2024-06-29 ENCOUNTER — Other Ambulatory Visit: Payer: Self-pay

## 2024-06-29 ENCOUNTER — Encounter (HOSPITAL_COMMUNITY): Payer: Self-pay

## 2024-06-29 DIAGNOSIS — Z823 Family history of stroke: Secondary | ICD-10-CM

## 2024-06-29 DIAGNOSIS — E785 Hyperlipidemia, unspecified: Secondary | ICD-10-CM | POA: Diagnosis not present

## 2024-06-29 DIAGNOSIS — E119 Type 2 diabetes mellitus without complications: Secondary | ICD-10-CM | POA: Diagnosis present

## 2024-06-29 DIAGNOSIS — Z79899 Other long term (current) drug therapy: Secondary | ICD-10-CM | POA: Diagnosis not present

## 2024-06-29 DIAGNOSIS — M810 Age-related osteoporosis without current pathological fracture: Secondary | ICD-10-CM | POA: Diagnosis present

## 2024-06-29 DIAGNOSIS — W19XXXD Unspecified fall, subsequent encounter: Secondary | ICD-10-CM | POA: Diagnosis not present

## 2024-06-29 DIAGNOSIS — Y92531 Health care provider office as the place of occurrence of the external cause: Secondary | ICD-10-CM | POA: Diagnosis not present

## 2024-06-29 DIAGNOSIS — M797 Fibromyalgia: Secondary | ICD-10-CM | POA: Diagnosis not present

## 2024-06-29 DIAGNOSIS — M549 Dorsalgia, unspecified: Secondary | ICD-10-CM | POA: Diagnosis not present

## 2024-06-29 DIAGNOSIS — G8911 Acute pain due to trauma: Secondary | ICD-10-CM | POA: Diagnosis not present

## 2024-06-29 DIAGNOSIS — Z7901 Long term (current) use of anticoagulants: Secondary | ICD-10-CM | POA: Diagnosis not present

## 2024-06-29 DIAGNOSIS — M1990 Unspecified osteoarthritis, unspecified site: Secondary | ICD-10-CM | POA: Diagnosis present

## 2024-06-29 DIAGNOSIS — G4733 Obstructive sleep apnea (adult) (pediatric): Secondary | ICD-10-CM | POA: Diagnosis present

## 2024-06-29 DIAGNOSIS — Z8249 Family history of ischemic heart disease and other diseases of the circulatory system: Secondary | ICD-10-CM | POA: Diagnosis not present

## 2024-06-29 DIAGNOSIS — Z7984 Long term (current) use of oral hypoglycemic drugs: Secondary | ICD-10-CM

## 2024-06-29 DIAGNOSIS — W19XXXA Unspecified fall, initial encounter: Principal | ICD-10-CM

## 2024-06-29 DIAGNOSIS — I4819 Other persistent atrial fibrillation: Secondary | ICD-10-CM | POA: Diagnosis not present

## 2024-06-29 DIAGNOSIS — S2243XA Multiple fractures of ribs, bilateral, initial encounter for closed fracture: Secondary | ICD-10-CM | POA: Diagnosis not present

## 2024-06-29 DIAGNOSIS — S2242XA Multiple fractures of ribs, left side, initial encounter for closed fracture: Principal | ICD-10-CM | POA: Diagnosis present

## 2024-06-29 DIAGNOSIS — Z888 Allergy status to other drugs, medicaments and biological substances status: Secondary | ICD-10-CM | POA: Diagnosis not present

## 2024-06-29 DIAGNOSIS — D638 Anemia in other chronic diseases classified elsewhere: Secondary | ICD-10-CM | POA: Diagnosis present

## 2024-06-29 DIAGNOSIS — E1140 Type 2 diabetes mellitus with diabetic neuropathy, unspecified: Secondary | ICD-10-CM | POA: Diagnosis not present

## 2024-06-29 DIAGNOSIS — F32A Depression, unspecified: Secondary | ICD-10-CM | POA: Diagnosis not present

## 2024-06-29 DIAGNOSIS — Z8349 Family history of other endocrine, nutritional and metabolic diseases: Secondary | ICD-10-CM

## 2024-06-29 DIAGNOSIS — K219 Gastro-esophageal reflux disease without esophagitis: Secondary | ICD-10-CM | POA: Diagnosis present

## 2024-06-29 DIAGNOSIS — Z833 Family history of diabetes mellitus: Secondary | ICD-10-CM

## 2024-06-29 DIAGNOSIS — Z170 Estrogen receptor positive status [ER+]: Secondary | ICD-10-CM

## 2024-06-29 DIAGNOSIS — R0782 Intercostal pain: Secondary | ICD-10-CM | POA: Diagnosis not present

## 2024-06-29 DIAGNOSIS — G4730 Sleep apnea, unspecified: Secondary | ICD-10-CM | POA: Diagnosis not present

## 2024-06-29 DIAGNOSIS — I10 Essential (primary) hypertension: Secondary | ICD-10-CM | POA: Diagnosis not present

## 2024-06-29 DIAGNOSIS — Z1152 Encounter for screening for COVID-19: Secondary | ICD-10-CM

## 2024-06-29 DIAGNOSIS — M47814 Spondylosis without myelopathy or radiculopathy, thoracic region: Secondary | ICD-10-CM | POA: Diagnosis not present

## 2024-06-29 DIAGNOSIS — E669 Obesity, unspecified: Secondary | ICD-10-CM | POA: Diagnosis present

## 2024-06-29 DIAGNOSIS — I5032 Chronic diastolic (congestive) heart failure: Secondary | ICD-10-CM | POA: Diagnosis not present

## 2024-06-29 DIAGNOSIS — D649 Anemia, unspecified: Secondary | ICD-10-CM | POA: Diagnosis not present

## 2024-06-29 DIAGNOSIS — J45909 Unspecified asthma, uncomplicated: Secondary | ICD-10-CM | POA: Diagnosis not present

## 2024-06-29 DIAGNOSIS — R296 Repeated falls: Secondary | ICD-10-CM | POA: Diagnosis present

## 2024-06-29 DIAGNOSIS — Z881 Allergy status to other antibiotic agents status: Secondary | ICD-10-CM

## 2024-06-29 DIAGNOSIS — E66811 Obesity, class 1: Secondary | ICD-10-CM | POA: Diagnosis not present

## 2024-06-29 DIAGNOSIS — I110 Hypertensive heart disease with heart failure: Secondary | ICD-10-CM | POA: Diagnosis present

## 2024-06-29 DIAGNOSIS — E66812 Obesity, class 2: Secondary | ICD-10-CM | POA: Diagnosis present

## 2024-06-29 DIAGNOSIS — Z6836 Body mass index (BMI) 36.0-36.9, adult: Secondary | ICD-10-CM

## 2024-06-29 DIAGNOSIS — G8929 Other chronic pain: Secondary | ICD-10-CM | POA: Diagnosis not present

## 2024-06-29 DIAGNOSIS — Z79810 Long term (current) use of selective estrogen receptor modulators (SERMs): Secondary | ICD-10-CM

## 2024-06-29 DIAGNOSIS — W1830XA Fall on same level, unspecified, initial encounter: Secondary | ICD-10-CM | POA: Diagnosis present

## 2024-06-29 DIAGNOSIS — S2249XA Multiple fractures of ribs, unspecified side, initial encounter for closed fracture: Secondary | ICD-10-CM | POA: Diagnosis present

## 2024-06-29 DIAGNOSIS — K5900 Constipation, unspecified: Secondary | ICD-10-CM | POA: Diagnosis not present

## 2024-06-29 DIAGNOSIS — Z885 Allergy status to narcotic agent status: Secondary | ICD-10-CM | POA: Diagnosis not present

## 2024-06-29 DIAGNOSIS — R109 Unspecified abdominal pain: Secondary | ICD-10-CM | POA: Diagnosis not present

## 2024-06-29 DIAGNOSIS — S2242XD Multiple fractures of ribs, left side, subsequent encounter for fracture with routine healing: Secondary | ICD-10-CM | POA: Diagnosis not present

## 2024-06-29 DIAGNOSIS — Z853 Personal history of malignant neoplasm of breast: Secondary | ICD-10-CM | POA: Diagnosis not present

## 2024-06-29 DIAGNOSIS — I700 Atherosclerosis of aorta: Secondary | ICD-10-CM | POA: Diagnosis not present

## 2024-06-29 DIAGNOSIS — I480 Paroxysmal atrial fibrillation: Secondary | ICD-10-CM | POA: Diagnosis not present

## 2024-06-29 DIAGNOSIS — E1169 Type 2 diabetes mellitus with other specified complication: Secondary | ICD-10-CM | POA: Diagnosis present

## 2024-06-29 DIAGNOSIS — Z7401 Bed confinement status: Secondary | ICD-10-CM | POA: Diagnosis not present

## 2024-06-29 DIAGNOSIS — Y998 Other external cause status: Secondary | ICD-10-CM

## 2024-06-29 DIAGNOSIS — K449 Diaphragmatic hernia without obstruction or gangrene: Secondary | ICD-10-CM | POA: Diagnosis not present

## 2024-06-29 DIAGNOSIS — Z808 Family history of malignant neoplasm of other organs or systems: Secondary | ICD-10-CM

## 2024-06-29 DIAGNOSIS — Z8261 Family history of arthritis: Secondary | ICD-10-CM

## 2024-06-29 DIAGNOSIS — C50411 Malignant neoplasm of upper-outer quadrant of right female breast: Secondary | ICD-10-CM | POA: Diagnosis not present

## 2024-06-29 DIAGNOSIS — Z923 Personal history of irradiation: Secondary | ICD-10-CM | POA: Diagnosis not present

## 2024-06-29 DIAGNOSIS — Z91018 Allergy to other foods: Secondary | ICD-10-CM

## 2024-06-29 DIAGNOSIS — Z9049 Acquired absence of other specified parts of digestive tract: Secondary | ICD-10-CM

## 2024-06-29 DIAGNOSIS — Z800 Family history of malignant neoplasm of digestive organs: Secondary | ICD-10-CM

## 2024-06-29 LAB — CBC
HCT: 36.4 % (ref 36.0–46.0)
Hemoglobin: 11.5 g/dL — ABNORMAL LOW (ref 12.0–15.0)
MCH: 31.1 pg (ref 26.0–34.0)
MCHC: 31.6 g/dL (ref 30.0–36.0)
MCV: 98.4 fL (ref 80.0–100.0)
Platelets: 168 K/uL (ref 150–400)
RBC: 3.7 MIL/uL — ABNORMAL LOW (ref 3.87–5.11)
RDW: 13.2 % (ref 11.5–15.5)
WBC: 6.1 K/uL (ref 4.0–10.5)
nRBC: 0 % (ref 0.0–0.2)

## 2024-06-29 LAB — RESP PANEL BY RT-PCR (RSV, FLU A&B, COVID)  RVPGX2
Influenza A by PCR: NEGATIVE
Influenza B by PCR: NEGATIVE
Resp Syncytial Virus by PCR: NEGATIVE
SARS Coronavirus 2 by RT PCR: NEGATIVE

## 2024-06-29 LAB — BASIC METABOLIC PANEL WITH GFR
Anion gap: 11 (ref 5–15)
BUN: 20 mg/dL (ref 8–23)
CO2: 27 mmol/L (ref 22–32)
Calcium: 9.3 mg/dL (ref 8.9–10.3)
Chloride: 102 mmol/L (ref 98–111)
Creatinine, Ser: 0.95 mg/dL (ref 0.44–1.00)
GFR, Estimated: >60 mL/min (ref >60)
Glucose, Bld: 155 mg/dL — ABNORMAL HIGH (ref 70–99)
Potassium: 4.1 mmol/L (ref 3.5–5.1)
Sodium: 140 mmol/L (ref 135–145)

## 2024-06-29 MED ORDER — LOSARTAN POTASSIUM 50 MG PO TABS
100.0000 mg | ORAL_TABLET | Freq: Every morning | ORAL | Status: DC
  Administered 2024-06-30 – 2024-07-07 (×8): 100 mg via ORAL
  Filled 2024-06-29 (×8): qty 2

## 2024-06-29 MED ORDER — PANTOPRAZOLE SODIUM 40 MG PO TBEC
40.0000 mg | DELAYED_RELEASE_TABLET | Freq: Two times a day (BID) | ORAL | Status: DC
  Administered 2024-06-30 – 2024-07-07 (×17): 40 mg via ORAL
  Filled 2024-06-29 (×16): qty 1

## 2024-06-29 MED ORDER — OXYCODONE HCL 5 MG PO TABS
5.0000 mg | ORAL_TABLET | Freq: Four times a day (QID) | ORAL | Status: DC | PRN
  Administered 2024-06-29 – 2024-07-01 (×6): 5 mg via ORAL
  Filled 2024-06-29 (×5): qty 1

## 2024-06-29 MED ORDER — POTASSIUM CHLORIDE CRYS ER 20 MEQ PO TBCR
20.0000 meq | EXTENDED_RELEASE_TABLET | Freq: Every day | ORAL | Status: DC
  Administered 2024-06-30 – 2024-07-07 (×8): 20 meq via ORAL
  Filled 2024-06-29 (×8): qty 1

## 2024-06-29 MED ORDER — CALCIUM CARBONATE ANTACID 500 MG PO CHEW
500.0000 mg | CHEWABLE_TABLET | Freq: Every morning | ORAL | Status: DC
  Administered 2024-06-30 – 2024-07-07 (×8): 200 mg via ORAL
  Filled 2024-06-29 (×8): qty 1

## 2024-06-29 MED ORDER — FUROSEMIDE 40 MG PO TABS
40.0000 mg | ORAL_TABLET | Freq: Every day | ORAL | Status: AC
Start: 2024-06-30 — End: ?
  Administered 2024-06-30 – 2024-07-07 (×8): 40 mg via ORAL
  Filled 2024-06-29 (×8): qty 1

## 2024-06-29 MED ORDER — IOHEXOL 350 MG/ML SOLN: 75.0000 mL | Freq: Once | INTRAVENOUS | Status: DC | PRN

## 2024-06-29 MED ORDER — HYDROMORPHONE HCL 1 MG/ML IJ SOLN
1.0000 mg | Freq: Once | INTRAMUSCULAR | Status: AC
  Administered 2024-06-29 (×2): 1 mg via INTRAVENOUS
  Filled 2024-06-29: qty 1

## 2024-06-29 MED ORDER — IOHEXOL 350 MG/ML SOLN
75.0000 mL | Freq: Once | INTRAVENOUS | Status: AC | PRN
  Administered 2024-06-29 (×2): 75 mL via INTRAVENOUS

## 2024-06-29 MED ORDER — METHOCARBAMOL 500 MG PO TABS
500.0000 mg | ORAL_TABLET | Freq: Four times a day (QID) | ORAL | Status: DC | PRN
  Administered 2024-06-30 (×3): 500 mg via ORAL
  Filled 2024-06-29 (×2): qty 1

## 2024-06-29 MED ORDER — METOPROLOL SUCCINATE ER 100 MG PO TB24
100.0000 mg | ORAL_TABLET | Freq: Every morning | ORAL | Status: DC
  Administered 2024-06-30 – 2024-07-06 (×7): 100 mg via ORAL
  Filled 2024-06-29 (×8): qty 1

## 2024-06-29 MED ORDER — INSULIN ASPART 100 UNIT/ML IJ SOLN
0.0000 [IU] | Freq: Three times a day (TID) | INTRAMUSCULAR | Status: DC
  Administered 2024-06-30 (×2): 2 [IU] via SUBCUTANEOUS
  Administered 2024-06-30: 1 [IU] via SUBCUTANEOUS
  Administered 2024-07-01: 2 [IU] via SUBCUTANEOUS
  Administered 2024-07-01: 1 [IU] via SUBCUTANEOUS
  Administered 2024-07-01 – 2024-07-02 (×2): 2 [IU] via SUBCUTANEOUS
  Administered 2024-07-02 – 2024-07-03 (×5): 1 [IU] via SUBCUTANEOUS
  Administered 2024-07-04 (×3): 2 [IU] via SUBCUTANEOUS
  Administered 2024-07-05: 1 [IU] via SUBCUTANEOUS
  Administered 2024-07-05 (×2): 2 [IU] via SUBCUTANEOUS
  Administered 2024-07-06: 1 [IU] via SUBCUTANEOUS
  Administered 2024-07-06: 2 [IU] via SUBCUTANEOUS
  Administered 2024-07-07: 1 [IU] via SUBCUTANEOUS
  Administered 2024-07-07: 2 [IU] via SUBCUTANEOUS

## 2024-06-29 MED ORDER — VITAMIN D 25 MCG (1000 UNIT) PO TABS
1000.0000 [IU] | ORAL_TABLET | Freq: Every day | ORAL | Status: DC
  Administered 2024-06-30 – 2024-07-07 (×8): 1000 [IU] via ORAL
  Filled 2024-06-29 (×8): qty 1

## 2024-06-29 MED ORDER — VENLAFAXINE HCL ER 37.5 MG PO CP24
112.5000 mg | ORAL_CAPSULE | Freq: Every day | ORAL | Status: DC
  Administered 2024-06-30 – 2024-07-06 (×8): 112.5 mg via ORAL
  Filled 2024-06-29 (×9): qty 3

## 2024-06-29 MED ORDER — APIXABAN 5 MG PO TABS
5.0000 mg | ORAL_TABLET | Freq: Two times a day (BID) | ORAL | Status: DC
  Administered 2024-06-30 – 2024-07-07 (×17): 5 mg via ORAL
  Filled 2024-06-29 (×16): qty 1

## 2024-06-29 MED ORDER — GABAPENTIN 300 MG PO CAPS
300.0000 mg | ORAL_CAPSULE | Freq: Every day | ORAL | Status: DC
  Administered 2024-06-30 – 2024-07-01 (×4): 300 mg via ORAL
  Filled 2024-06-29 (×3): qty 1

## 2024-06-29 MED ORDER — HYDROMORPHONE HCL 1 MG/ML IJ SOLN
0.5000 mg | Freq: Once | INTRAMUSCULAR | Status: AC
  Administered 2024-06-29 (×2): 0.5 mg via INTRAVENOUS
  Filled 2024-06-29: qty 1

## 2024-06-29 NOTE — H&P (Signed)
 History and Physical    Kayla Bennett FMW:[id number] DOB: 1951-01-11 DOA: 06/29/2024  Patient coming from: Home.  Chief Complaint: Fall.  HPI: Kayla Bennett is a 73 y.o. female with history of diabetes mellitus type 2, persistent atrial fibrillation, hypertension, history of breast cancer status postlumpectomy and radiation in 2022, sleep apnea, chronic anemia, balance issues, chronic dyspnea had a fall at the doctor's office when patient was trying to turn around.  Denies hitting her head or losing consciousness.  She fell onto her left side and hurt the left side of the chest.  ED Course: In the ER patient had CT scan done of her chest abdomen and pelvis which shows features concerning for multiple rib fractures on the left side.  There are also old rib fractures on the right side.  Trauma surgeon was consulted and requested admission to hospitalist since there was no displaced fractures or pleural effusion.  Labs show hemoglobin of 11.5 creatinine 0.9 blood glucose 155 EKG shows normal sinus rhythm.  Review of Systems: As per HPI, rest all negative.   Past Medical History:  Diagnosis Date   Allergy 1978   Food allergies, seasonal allergies, medicine allergies   Anemia    Arthritis    Asthma    Blood transfusion without reported diagnosis 1985   Breast cancer (HCC)    Diabetes (HCC)    Dyspnea    Dysrhythmia    afibb on occasion   Family history of adverse reaction to anesthesia    mother had reaction to versed ...during surgery, stopped breathing   Fibromyalgia    GERD (gastroesophageal reflux disease)    Headache    History of hiatal hernia    Hyperlipidemia 1990?   Hypertension    Migraines    Osteopenia    Osteoporosis    Oxygen  deficiency 2023   Psoriasis    Sleep apnea 2023   Yeast infection 10/30/2021    Past Surgical History:  Procedure Laterality Date   ABDOMINAL HYSTERECTOMY     APPENDECTOMY     ATRIAL FIBRILLATION ABLATION N/A 12/10/2021    Procedure: ATRIAL FIBRILLATION ABLATION;  Surgeon: Inocencio Soyla Lunger, MD;  Location: [facility];  Service: Cardiovascular;  Laterality: N/A;   BREAST LUMPECTOMY WITH RADIOACTIVE SEED AND SENTINEL LYMPH NODE BIOPSY Right 02/18/2021   Procedure: RIGHT BREAST LUMPECTOMY WITH RADIOACTIVE SEED AND SENTINEL LYMPH NODE BIOPSY;  Surgeon: Aron Shoulders, MD;  Location: [facility] OR;  Service: General;  Laterality: Right;   BREAST SURGERY  2022   broken wrist     CARDIOVERSION N/A 04/04/2021   Procedure: CARDIOVERSION;  Surgeon: Shlomo Wilbert SAUNDERS, MD;  Location: [facility] ENDOSCOPY;  Service: Cardiovascular;  Laterality: N/A;   CARDIOVERSION N/A 08/14/2021   Procedure: CARDIOVERSION;  Surgeon: Barbaraann Darryle Ned, MD;  Location: [facility] ENDOSCOPY;  Service: Cardiovascular;  Laterality: N/A;   CARDIOVERSION N/A 09/19/2021   Procedure: CARDIOVERSION;  Surgeon: Barbaraann Darryle Ned, MD;  Location: [facility] ENDOSCOPY;  Service: Cardiovascular;  Laterality: N/A;   CESAREAN SECTION  1976 and 1977   CHOLECYSTECTOMY     FRACTURE SURGERY     right wrist   HYSTERECTOMY ABDOMINAL WITH SALPINGO-OOPHORECTOMY     LYSIS OF ADHESION     RIGHT/LEFT HEART CATH AND CORONARY ANGIOGRAPHY N/A 02/18/2024   Procedure: RIGHT/LEFT HEART CATH AND CORONARY ANGIOGRAPHY;  Surgeon: Verlin Lonni BIRCH, MD;  Location: [facility];  Service: Cardiovascular;  Laterality: N/A;   SHOULDER SURGERY Left    TUBAL LIGATION  reports that she has never smoked. She has never used smokeless tobacco. She reports that she does not drink alcohol and does not use drugs.  Allergies  Allergen Reactions   Lotensin [Benazepril Hcl] Swelling   Morphine Itching and Rash   Erythromycin Base Other (See Comments)    Unknown reaction   Hydrocodone  Other (See Comments)    Unknown reaction     Other Swelling    Mustard - vaginal swelling/pain    Banana Swelling    vaginal swelling/pain   Chocolate Swelling    vaginal swelling/pain   Gluten  Meal Diarrhea and Other (See Comments)    Upset stomach    Tizanidine Other (See Comments)    Lowered blood pressure    Wound Dressing Adhesive Other (See Comments)    Skin burns, blisters     Family History  Problem Relation Age of Onset   Atrial fibrillation Mother    Heart attack Mother 26   Arthritis Mother    Cancer Mother    Diabetes Mother    Heart disease Mother    Hypertension Mother    Colon cancer Father 93   Heart attack Father 84   Cancer Father    Early death Father    Heart disease Father    Hypertension Father    Heart attack Paternal Grandmother 39   Cancer Maternal Uncle        melanoma    Colon cancer Paternal Uncle    Cancer Paternal Uncle    Diabetes Paternal Uncle    Heart disease Paternal Uncle    Obesity Paternal Uncle    Colon cancer Cousin    Atrial fibrillation Cousin    Stroke Maternal Grandmother    Stroke Paternal Grandfather    Arthritis Maternal Aunt    Diabetes Maternal Aunt    Hearing loss Maternal Aunt    Cancer Paternal Uncle    Heart disease Paternal Uncle    Obesity Paternal Uncle    Diabetes Maternal Aunt    Diabetes Maternal Aunt    Heart disease Maternal Aunt    Diabetes Maternal Uncle    Hearing loss Maternal Uncle    Diabetes Paternal Aunt    Hearing loss Paternal Aunt    Diabetes Sister    Hyperlipidemia Sister    Hypertension Sister    Diabetes Paternal Uncle    Hearing loss Paternal Uncle    Diabetes Paternal Uncle    Hearing loss Paternal Uncle    Heart disease Paternal Uncle    Hypertension Paternal Uncle    Diabetes Paternal Uncle    Stroke Maternal Uncle     Prior to Admission medications   Medication Sig Start Date End Date Taking? Authorizing Provider  acetaminophen  (TYLENOL ) 500 MG tablet Take 500 mg by mouth every 6 (six) hours as needed for moderate pain, headache or mild pain.   Yes [provider]  acetaminophen  (TYLENOL ) 650 MG CR tablet Take 650 mg by mouth every 8 (eight) hours as  needed for pain.   Yes [provider]  albuterol  (VENTOLIN  HFA) 108 (90 Base) MCG/ACT inhaler Inhale 2 puffs into the lungs every 6 (six) hours as needed for wheezing or shortness of breath. 10/09/23  Yes Alvia Bring, DO  Calcium  Carbonate (CALCIUM  600 PO) Take 600 mg by mouth in the morning.   Yes [provider]  cetirizine (ZYRTEC) 10 MG tablet Take 10 mg by mouth in the morning.   Yes [provider]  cholecalciferol  (  VITAMIN D3) 25 MCG (1000 UNIT) tablet Take 1,000 Units by mouth in the morning and at bedtime.   Yes [provider]  ELIQUIS  5 MG TABS tablet TAKE ONE TABLET BY MOUTH TWICE DAILY 05/02/24  Yes Tobb, Kardie, DO  Evolocumab  (REPATHA  SURECLICK) 140 MG/ML SOAJ Inject 140 mg into the skin every 14 (fourteen) days. 06/13/24  Yes Tobb, Kardie, DO  ferrous sulfate  325 (65 FE) MG tablet Take 325 mg by mouth every evening.   Yes [provider]  furosemide  (LASIX ) 40 MG tablet Take 1 tablet (40 mg total) by mouth daily. Patient taking differently: Take 40 mg by mouth in the morning. 02/18/24  Yes Verlin Lonni BIRCH, MD  gabapentin  (NEURONTIN ) 100 MG capsule TAKE THREE CAPSULES BY MOUTH AT BEDTIME Patient taking differently: Take 200 mg by mouth at bedtime. 05/18/24  Yes Alvia Bring, DO  hydrocortisone 2.5 % cream Apply 1 Application topically 2 (two) times daily.   Yes [provider]  ibuprofen  (ADVIL ) 200 MG tablet Take 200 mg by mouth every 6 (six) hours as needed for mild pain (pain score 1-3).   Yes [provider]  losartan  (COZAAR ) 100 MG tablet Take 1 tablet (100 mg total) by mouth daily. Patient taking differently: Take 100 mg by mouth in the morning. 02/18/24  Yes Verlin Lonni BIRCH, MD  metFORMIN  (GLUCOPHAGE -XR) 500 MG 24 hr tablet TAKE ONE TABLET BY MOUTH EVERY DAY Patient taking differently: Take 500 mg by mouth at bedtime. 08/03/23  Yes Alvia Bring, DO  metoprolol  succinate (TOPROL -XL) 100 MG 24 hr  tablet Take 1 tablet (100 mg total) by mouth daily. Take with or immediately following a meal. Patient taking differently: Take 100 mg by mouth in the morning. Take with or immediately following a meal. 12/28/23  Yes Alvia Bring, DO  nystatin cream (MYCOSTATIN) Apply 1 Application topically as needed for dry skin.   Yes [provider]  pantoprazole  (PROTONIX ) 40 MG tablet TAKE ONE TABLET BY MOUTH TWICE DAILY 05/30/24  Yes Alvia Bring, DO  potassium chloride  SA (KLOR-CON  M) 20 MEQ tablet Take 1 tablet (20 mEq total) by mouth daily. Patient taking differently: Take 20 mEq by mouth in the morning. 04/07/24  Yes Alvia Bring, DO  Prenatal Vit-Fe Fumarate-FA (PRENATAL VITAMIN PLUS LOW IRON PO) Take 1 tablet by mouth in the morning.   Yes [provider]  tamoxifen  (NOLVADEX ) 20 MG tablet Take 1 tablet (20 mg total) by mouth every morning. 09/22/23  Yes Burton, [name], NP  triamcinolone (KENALOG) 0.1 % Apply 1 application  topically 2 (two) times daily as needed (psoriasis). Mixed with Cetaphil 1:1   Yes [provider]  venlafaxine  XR (EFFEXOR -XR) 37.5 MG 24 hr capsule Take 1 capsule (37.5 mg total) by mouth at bedtime. Patient taking differently: Take 37.5 mg by mouth every evening. Take 1 tablet by mouth with 1 tablet of 75mg  Venlafaxine  for a combined dose of 112.5mg  Venlafaxine . 07/08/23  Yes Alvia Bring, DO  venlafaxine  XR (EFFEXOR -XR) 75 MG 24 hr capsule TAKE ONE CAPSULE BY MOUTH EVERY DAY Patient taking differently: Take 75 mg by mouth every evening. Take 1 tablet by mouth with 1 tablet of 37.5mg  Venlafaxine  for a combined dose of 112.5mg  Venlafaxine . 04/25/24  Yes Alvia Bring, DO  vitamin C (ASCORBIC ACID) 500 MG tablet Take 500 mg by mouth every evening.   Yes [provider]  UNABLE TO FIND OUTPATIENT PHYSICAL THERAPY    Diagnosis: Generalized debility due to UTI, acute gastroenteritis  08/23/22   Davia Nydia POUR, MD    Physical  Exam: Constitutional: Moderately built and nourished. Vitals:   06/29/24 1510 06/29/24 1511 06/29/24 1938 06/29/24 2150  BP:  (!) 177/92 (!) 148/90 (!) 169/64  Pulse:  64 69 87  Resp:  18 19   Temp:  100 F (37.8 C) 98.2 F (36.8 C) 97.7 F (36.5 C)  TempSrc:  Oral Oral Oral  SpO2:  100% 100% 100%  Weight: 90.7 kg     Height: 5' 2 (1.575 m)      Eyes: Anicteric no pallor. ENMT: No discharge from the ears eyes nose or mouth. Neck: No mass felt.  No neck rigidity. Respiratory: No rhonchi or crepitations. Cardiovascular: S1-S2 heard. Abdomen: Soft nontender bowel sound present. Musculoskeletal: No edema.  Pain on palpating the left side of the chest. Skin: No rash. Neurologic: Alert awake oriented to time place and person.  Moves all extremities. Psychiatric: Appears normal.  Normal affect.   Labs on Admission: I have personally reviewed following labs and imaging studies  CBC: Recent Labs  Lab 06/29/24 1546  WBC 6.1  HGB 11.5*  HCT 36.4  MCV 98.4  PLT 168   Basic Metabolic Panel: Recent Labs  Lab 06/29/24 1546  NA 140  K 4.1  CL 102  CO2 27  GLUCOSE 155*  BUN 20  CREATININE 0.95  CALCIUM  9.3   GFR: Estimated Creatinine Clearance: 55.2 mL/min (by C-G formula based on SCr of 0.95 mg/dL). Liver Function Tests: No results for input(s): AST, ALT, ALKPHOS, BILITOT, PROT, ALBUMIN in the last 168 hours. No results for input(s): LIPASE, AMYLASE in the last 168 hours. No results for input(s): AMMONIA in the last 168 hours. Coagulation Profile: No results for input(s): INR, PROTIME in the last 168 hours. Cardiac Enzymes: No results for input(s): CKTOTAL, CKMB, CKMBINDEX, TROPONINI in the last 168 hours. BNP (last 3 results) No results for input(s): PROBNP in the last 8760 hours. HbA1C: No results for input(s): HGBA1C in the last 72 hours. CBG: No results for input(s): GLUCAP in the last 168 hours. Lipid Profile: No results  for input(s): CHOL, HDL, LDLCALC, TRIG, CHOLHDL, LDLDIRECT in the last 72 hours. Thyroid  Function Tests: No results for input(s): TSH, T4TOTAL, FREET4, T3FREE, THYROIDAB in the last 72 hours. Anemia Panel: No results for input(s): VITAMINB12, FOLATE, FERRITIN, TIBC, IRON, RETICCTPCT in the last 72 hours. Urine analysis:    Component Value Date/Time   COLORURINE AMBER (A) 08/27/2022 1025   APPEARANCEUR CLEAR 08/27/2022 1025   LABSPEC 1.028 08/27/2022 1025   PHURINE 7.0 08/27/2022 1025   GLUCOSEU NEGATIVE 08/27/2022 1025   HGBUR NEGATIVE 08/27/2022 1025   BILIRUBINUR NEGATIVE 08/27/2022 1025   KETONESUR NEGATIVE 08/27/2022 1025   PROTEINUR NEGATIVE 08/27/2022 1025   NITRITE NEGATIVE 08/27/2022 1025   LEUKOCYTESUR NEGATIVE 08/27/2022 1025   Sepsis Labs: @LABRCNTIP (procalcitonin:4,lacticidven:4) ) Recent Results (from the past 240 hours)  Resp panel by RT-PCR (RSV, Flu A&B, Covid) Anterior Nasal Swab     Status: None   Collection Time: 06/29/24  3:46 PM   Specimen: Anterior Nasal Swab  Result Value Ref Range Status   SARS Coronavirus 2 by RT PCR NEGATIVE NEGATIVE Final   Influenza A by PCR NEGATIVE NEGATIVE Final   Influenza B by PCR NEGATIVE NEGATIVE Final    Comment: (NOTE) The Xpert Xpress SARS-CoV-2/FLU/RSV plus assay is intended as an aid in the diagnosis of influenza from Nasopharyngeal swab specimens and should not be used as a sole basis for  treatment. Nasal washings and aspirates are unacceptable for Xpert Xpress SARS-CoV-2/FLU/RSV testing.  Fact Sheet for Patients: BloggerCourse.com  Fact Sheet for Healthcare Providers: SeriousBroker.it  This test is not yet approved or cleared by the [place]  FDA and has been authorized for detection and/or diagnosis of SARS-CoV-2 by FDA under an Emergency Use Authorization (EUA). This EUA will remain in effect (meaning this test can be used)  for the duration of the COVID-19 declaration under Section 564(b)(1) of the Act, 21 U.S.C. section 360bbb-3(b)(1), unless the authorization is terminated or revoked.     Resp Syncytial Virus by PCR NEGATIVE NEGATIVE Final    Comment: (NOTE) Fact Sheet for Patients: BloggerCourse.com  Fact Sheet for Healthcare Providers: SeriousBroker.it  This test is not yet approved or cleared by the [place]  FDA and has been authorized for detection and/or diagnosis of SARS-CoV-2 by FDA under an Emergency Use Authorization (EUA). This EUA will remain in effect (meaning this test can be used) for the duration of the COVID-19 declaration under Section 564(b)(1) of the Act, 21 U.S.C. section 360bbb-3(b)(1), unless the authorization is terminated or revoked.  Performed at [facility], [address], [place], [place] [address]      Radiological Exams on Admission: CT T-SPINE NO CHARGE Result Date: 06/29/2024 CLINICAL DATA:  Recent fall while on blood thinners with known rib fractures on the left and back pain, initial encounter EXAM: CT THORACIC SPINE WITHOUT CONTRAST TECHNIQUE: Multidetector CT images of the thoracic were obtained using the standard protocol without intravenous contrast. RADIATION DOSE REDUCTION: This exam was performed according to the departmental dose-optimization program which includes automated exposure control, adjustment of the mA and/or kV according to patient size and/or use of iterative reconstruction technique. COMPARISON:  CT from earlier in the same day, 04/04/2024 CT FINDINGS: Alignment: Within normal limits. Vertebrae: Vertebral body height is well maintained. Mild osteophytic changes are seen. No pedicle abnormality or paraspinal mass lesion is seen. Paraspinal and other soft tissues: Surrounding soft tissue structures show no acute abnormality. Multiple rib fractures are seen on the left similar to that noted on  prior CT examination. Again no pneumothorax is seen. Disc levels: No significant disc pathology is identified. No significant central canal stenosis is noted. IMPRESSION: Degenerative changes of the thoracic spine without acute abnormality. Multiple left-sided rib fractures similar to that noted on prior CT examination. No pneumothorax is noted. Electronically Signed   By: Oneil Devonshire M.D.   On: 06/29/2024 20:52   CT CHEST ABDOMEN PELVIS W CONTRAST Result Date: 06/29/2024 CLINICAL DATA:  Recent fall while on blood thinners with chest and abdominal pain, initial encounter EXAM: CT CHEST, ABDOMEN, AND PELVIS WITH CONTRAST TECHNIQUE: Multidetector CT imaging of the chest, abdomen and pelvis was performed following the standard protocol during bolus administration of intravenous contrast. RADIATION DOSE REDUCTION: This exam was performed according to the departmental dose-optimization program which includes automated exposure control, adjustment of the mA and/or kV according to patient size and/or use of iterative reconstruction technique. CONTRAST:  75mL OMNIPAQUE  IOHEXOL  350 MG/ML SOLN COMPARISON:  04/04/2024 FINDINGS: CT CHEST FINDINGS Cardiovascular: Atherosclerotic calcifications of the thoracic aorta are noted. No aneurysmal dilatation is seen. The heart is not significantly enlarged. The pulmonary artery is well visualized within normal enhancement pattern. No evidence of pulmonary emboli is noted although not timed for embolus evaluation. Mild coronary calcifications are noted. Mediastinum/Nodes: Thoracic inlet is within normal limits. No hilar or mediastinal adenopathy is seen. Small hiatal hernia is noted. The esophagus  is otherwise within normal limits. Lungs/Pleura: Lungs are well aerated bilaterally. No focal infiltrate or sizable effusion is seen. No pneumothorax is noted. Musculoskeletal: Mildly displaced fractures of the left seventh through tenth ribs are seen. Healing rib fractures on the right are  noted. No acute right rib fractures are seen. No compression deformity is noted. CT ABDOMEN PELVIS FINDINGS Hepatobiliary: No focal liver abnormality is seen. Status post cholecystectomy. No biliary dilatation. Pancreas: Unremarkable. No pancreatic ductal dilatation or surrounding inflammatory changes. Spleen: Normal in size without focal abnormality. Adrenals/Urinary Tract: Adrenal glands are within normal limits. Kidneys demonstrate a normal enhancement pattern bilaterally. No renal calculi are noted. No obstructive changes are seen. The bladder is well distended. Stomach/Bowel: Scattered fecal material is noted throughout the colon. No obstructive changes are noted. The appendix has been surgically removed. Small bowel and stomach appear within normal limits aside from the previously mentioned hiatal hernia. Vascular/Lymphatic: Aortic atherosclerosis. No enlarged abdominal or pelvic lymph nodes. Reproductive: Status post hysterectomy. No adnexal masses. Other: No abdominal wall hernia or abnormality. No abdominopelvic ascites. Musculoskeletal: No acute bony abnormality is noted. IMPRESSION: CT of the chest: Multiple bilateral rib fractures, acute on the left and subacute on the right. No pneumothorax is seen.  No other focal abnormality is noted. CT of the abdomen and pelvis: No acute abnormality is noted to correspond with the given clinical history. Electronically Signed   By: Oneil Devonshire M.D.   On: 06/29/2024 19:34    EKG: Independently reviewed.  Sinus rhythm.  Assessment/Plan Principal Problem:   Rib fractures Active Problems:   Malignant neoplasm of upper-outer quadrant of right breast in female, estrogen receptor positive (HCC)   Persistent atrial fibrillation (HCC)   Type 2 diabetes mellitus with obesity (HCC)   Hypertension   Anemia   Multiple rib fractures    Multiple rib fractures on the left side after mechanical fall.  Trauma surgeon at this time requested hospitalist admission.   Will keep patient on pain relief medication muscle relaxant physical therapy consult incentive spirometer.  There are multiple old rib fractures on the right side. Paroxysmal atrial fibrillation presently in sinus rhythm takes Eliquis  and metoprolol . Hypertension on ARB. Chronic diastolic dysfunction on Lasix .  Last 2D echo done in April showed EF of 60 to 65% with grade 2 diastolic dysfunction. Sleep apnea on BiPAP at bedtime. Hyperlipidemia on Repatha . Chronic anemia follow CBC. Diabetes mellitus type 2 takes metformin .  Last hemoglobin A1c was 6.3.  On sliding scale coverage. History of breast cancer status postlumpectomy radiation therapy on tamoxifen . Chronic pain on Effexor  and gabapentin . Anemia appears to be chronic follow CBC.  Since patient has rib fractures will need further management and more than 2 midnight stay.   DVT prophylaxis: Eliquis . Code Status: Full code. Family Communication: Patient's husband. Disposition Plan: Medical floor. Consults called: Trauma surgery and physical therapy. Admission status: Observation.

## 2024-06-29 NOTE — ED Notes (Signed)
 Receiving RN stated she was trying to find bed for pt's assigned room at this time. ED Charge aware.

## 2024-06-29 NOTE — ED Triage Notes (Signed)
 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 Denies hitting head. Expresses pain to left thoracic area. + Blood thinners.

## 2024-06-29 NOTE — ED Provider Notes (Signed)
  EMERGENCY DEPARTMENT AT [facility] Provider Note   CSN: [id number] Arrival date & time: 06/29/24  1504     Patient presents with: Kayla Bennett is a 73 y.o. female with past medical history significant for persistent A-fib, osteopenia, balance problems, generalized weakness at baseline who presents after fall at doctor's office.  She reports that she feels like she lost her balance.  She denies feeling dizzy, lightheaded.  She fell on her left rib cage, left abdomen.  She denies hitting her head, neck.  She feels like she twisted her back.  Denies any numbness or tingling of her arms or legs.  Denies any pain in the hips, arms, shoulders.  650 mg of Tylenol  prior to arrival.  She rates her pain 10/10.    Fall       Prior to Admission medications   Medication Sig Start Date End Date Taking? Authorizing Provider  acetaminophen  (TYLENOL ) 500 MG tablet Take 500 mg by mouth every 6 (six) hours as needed for moderate pain, headache or mild pain.    [provider]  albuterol  (VENTOLIN  HFA) 108 (90 Base) MCG/ACT inhaler Inhale 2 puffs into the lungs every 6 (six) hours as needed for wheezing or shortness of breath. 10/09/23   Alvia Bring, DO  calcipotriene (DOVONOX) 0.005 % cream Apply 1 application  topically See admin instructions. Mix with fluorouracil 5% cream and apply topically twice daily for five days - as needed for precancerous on right cheek.    [provider]  Calcium  Carbonate (CALCIUM  600 PO) Take 600 mg by mouth daily.    [provider]  cephALEXin  (KEFLEX ) 500 MG capsule Take 500 mg by mouth 3 (three) times daily. 02/05/24   [provider]  cetirizine (ZYRTEC) 10 MG tablet Take 10 mg by mouth in the morning.    [provider]  cholecalciferol  (VITAMIN D3) 25 MCG (1000 UNIT) tablet Take 1,000 Units by mouth in the morning and at bedtime.    [provider]  ELIQUIS  5 MG TABS tablet TAKE  ONE TABLET BY MOUTH TWICE DAILY 05/02/24   Tobb, Kardie, DO  Evolocumab  (REPATHA  SURECLICK) 140 MG/ML SOAJ Inject 140 mg into the skin every 14 (fourteen) days. 06/13/24   Tobb, Kardie, DO  ferrous sulfate  325 (65 FE) MG tablet Take 325 mg by mouth every morning.    [provider]  fluconazole  (DIFLUCAN ) 150 MG tablet Take 150 mg by mouth every 3 (three) days. 02/05/24   [provider]  fluorouracil (EFUDEX) 5 % cream Apply 1 application  topically 2 (two) times daily as needed (precancerous spots on right temple and left eyebrow). Mix with calcipotriene 0.005% cream and apply topically twice daily for five days - as needed for precancerous on right temple and left eyebrow    [provider]  furosemide  (LASIX ) 40 MG tablet Take 1 tablet (40 mg total) by mouth daily. 02/18/24   Verlin Lonni BIRCH, MD  gabapentin  (NEURONTIN ) 100 MG capsule TAKE THREE CAPSULES BY MOUTH AT BEDTIME 05/18/24   Alvia Bring, DO  hydrocortisone 2.5 % cream Apply 1 Application topically 2 (two) times daily.    [provider]  ibuprofen  (ADVIL ) 200 MG tablet Take 200 mg by mouth every 6 (six) hours as needed for mild pain (pain score 1-3).    [provider]  losartan  (COZAAR ) 100 MG tablet Take 1 tablet (100 mg total) by mouth daily. 02/18/24   Verlin Lonni  D, MD  metFORMIN  (GLUCOPHAGE -XR) 500 MG 24 hr tablet TAKE ONE TABLET BY MOUTH EVERY DAY Patient taking differently: Take 500 mg by mouth at bedtime. 08/03/23   Alvia Bring, DO  metoprolol  succinate (TOPROL -XL) 100 MG 24 hr tablet Take 1 tablet (100 mg total) by mouth daily. Take with or immediately following a meal. 12/28/23   Alvia Bring, DO  pantoprazole  (PROTONIX ) 40 MG tablet TAKE ONE TABLET BY MOUTH TWICE DAILY 05/30/24   Alvia Bring, DO  phenazopyridine (PYRIDIUM) 100 MG tablet Take 100 mg by mouth 2 (two) times daily. 02/05/24   [provider]  potassium chloride  SA (KLOR-CON  M) 20 MEQ tablet Take  1 tablet (20 mEq total) by mouth daily. 04/07/24   Alvia Bring, DO  Prenatal Vit-Fe Fumarate-FA (PRENATAL VITAMIN PLUS LOW IRON PO) Take 1 tablet by mouth in the morning.    [provider]  tamoxifen  (NOLVADEX ) 20 MG tablet Take 1 tablet (20 mg total) by mouth every morning. 09/22/23   Burton, [name], NP  triamcinolone (KENALOG) 0.1 % Apply 1 application  topically 2 (two) times daily as needed (psoriasis). Mixed with Cetaphil 1:1    [provider]  UNABLE TO FIND OUTPATIENT PHYSICAL THERAPY    Diagnosis: Generalized debility due to UTI, acute gastroenteritis 08/23/22   Rai, Nydia POUR, MD  venlafaxine  XR (EFFEXOR -XR) 37.5 MG 24 hr capsule Take 1 capsule (37.5 mg total) by mouth at bedtime. 07/08/23   Alvia Bring, DO  venlafaxine  XR (EFFEXOR -XR) 75 MG 24 hr capsule TAKE ONE CAPSULE BY MOUTH EVERY DAY 04/25/24   Alvia Bring, DO  vitamin C (ASCORBIC ACID) 500 MG tablet Take 500 mg by mouth daily.    [provider]    Allergies: Lotensin [benazepril hcl], Morphine, Erythromycin base, Hydrocodone , Other, Banana, Chocolate, Gluten meal, Tizanidine, and Wound dressing adhesive    Review of Systems  All other systems reviewed and are negative.   Updated Vital Signs BP (!) 148/90 (BP Location: Left Arm)   Pulse 69   Temp 98.2 F (36.8 C) (Oral)   Resp 19   Ht 5' 2 (1.575 m)   Wt 90.7 kg   SpO2 100%   BMI 36.58 kg/m   Physical Exam Vitals and nursing note reviewed.  Constitutional:      General: She is not in acute distress.    Appearance: Normal appearance.  HENT:     Head: Normocephalic and atraumatic.  Eyes:     General:        Right eye: No discharge.        Left eye: No discharge.  Cardiovascular:     Rate and Rhythm: Normal rate and regular rhythm.     Heart sounds: No murmur heard.    No friction rub. No gallop.  Pulmonary:     Effort: Pulmonary effort is normal.     Breath sounds: Normal breath sounds.  Abdominal:     General: Bowel  sounds are normal.     Palpations: Abdomen is soft.     Comments: Significant tenderness palpation, soft tissue swelling, some bruising noted to left rib cage, left upper abdomen.  Skin:    General: Skin is warm and dry.     Capillary Refill: Capillary refill takes less than 2 seconds.  Neurological:     Mental Status: She is alert and oriented to person, place, and time.  Psychiatric:        Mood and Affect: Mood normal.  Behavior: Behavior normal.     (all labs ordered are listed, but only abnormal results are displayed) Labs Reviewed  CBC - Abnormal; Notable for the following components:      Result Value   RBC 3.70 (*)    Hemoglobin 11.5 (*)    All other components within normal limits  BASIC METABOLIC PANEL WITH GFR - Abnormal; Notable for the following components:   Glucose, Bld 155 (*)    All other components within normal limits  RESP PANEL BY RT-PCR (RSV, FLU A&B, COVID)  [id number]    EKG: None  Radiology: CT CHEST ABDOMEN PELVIS W CONTRAST Result Date: 06/29/2024 CLINICAL DATA:  Recent fall while on blood thinners with chest and abdominal pain, initial encounter EXAM: CT CHEST, ABDOMEN, AND PELVIS WITH CONTRAST TECHNIQUE: Multidetector CT imaging of the chest, abdomen and pelvis was performed following the standard protocol during bolus administration of intravenous contrast. RADIATION DOSE REDUCTION: This exam was performed according to the departmental dose-optimization program which includes automated exposure control, adjustment of the mA and/or kV according to patient size and/or use of iterative reconstruction technique. CONTRAST:  75mL OMNIPAQUE  IOHEXOL  350 MG/ML SOLN COMPARISON:  04/04/2024 FINDINGS: CT CHEST FINDINGS Cardiovascular: Atherosclerotic calcifications of the thoracic aorta are noted. No aneurysmal dilatation is seen. The heart is not significantly enlarged. The pulmonary artery is well visualized within normal enhancement pattern. No evidence of  pulmonary emboli is noted although not timed for embolus evaluation. Mild coronary calcifications are noted. Mediastinum/Nodes: Thoracic inlet is within normal limits. No hilar or mediastinal adenopathy is seen. Small hiatal hernia is noted. The esophagus is otherwise within normal limits. Lungs/Pleura: Lungs are well aerated bilaterally. No focal infiltrate or sizable effusion is seen. No pneumothorax is noted. Musculoskeletal: Mildly displaced fractures of the left seventh through tenth ribs are seen. Healing rib fractures on the right are noted. No acute right rib fractures are seen. No compression deformity is noted. CT ABDOMEN PELVIS FINDINGS Hepatobiliary: No focal liver abnormality is seen. Status post cholecystectomy. No biliary dilatation. Pancreas: Unremarkable. No pancreatic ductal dilatation or surrounding inflammatory changes. Spleen: Normal in size without focal abnormality. Adrenals/Urinary Tract: Adrenal glands are within normal limits. Kidneys demonstrate a normal enhancement pattern bilaterally. No renal calculi are noted. No obstructive changes are seen. The bladder is well distended. Stomach/Bowel: Scattered fecal material is noted throughout the colon. No obstructive changes are noted. The appendix has been surgically removed. Small bowel and stomach appear within normal limits aside from the previously mentioned hiatal hernia. Vascular/Lymphatic: Aortic atherosclerosis. No enlarged abdominal or pelvic lymph nodes. Reproductive: Status post hysterectomy. No adnexal masses. Other: No abdominal wall hernia or abnormality. No abdominopelvic ascites. Musculoskeletal: No acute bony abnormality is noted. IMPRESSION: CT of the chest: Multiple bilateral rib fractures, acute on the left and subacute on the right. No pneumothorax is seen.  No other focal abnormality is noted. CT of the abdomen and pelvis: No acute abnormality is noted to correspond with the given clinical history. Electronically Signed    By: Oneil Devonshire M.D.   On: 06/29/2024 19:34     Procedures   Medications Ordered in the ED  iohexol  (OMNIPAQUE ) 350 MG/ML injection 75 mL (has no administration in time range)  HYDROmorphone  (DILAUDID ) injection 0.5 mg (0.5 mg Intravenous Given 06/29/24 1544)  iohexol  (OMNIPAQUE ) 350 MG/ML injection 75 mL (75 mLs Intravenous Contrast Given 06/29/24 1715)  HYDROmorphone  (DILAUDID ) injection 1 mg (1 mg Intravenous Given 06/29/24 1943)  Medical Decision Making Amount and/or Complexity of Data Reviewed Labs: ordered. Radiology: ordered.  Risk Prescription drug management.   This patient is a 73 y.o. female  who presents to the ED for concern of fall, flank pain, chest pain.   Differential diagnoses prior to evaluation: The emergent differential diagnosis includes, but is not limited to,  acute intrathoracic injury, rib fracture, flail check, AAS, hollow viscus or solid organ laceration . This is not an exhaustive differential.   Past Medical History / Co-morbidities / Social History: persistent A-fib, osteopenia, balance problems, generalized weakness at baseline  Physical Exam: Physical exam performed. The pertinent findings include: Significant tenderness, questionable step-off of ribs on the left, normal breath sounds bilaterally.  Increased work of breathing due to discomfort  Lab Tests/Imaging studies: I personally interpreted labs/imaging and the pertinent results include: CBC overall unremarkable other than mild anemia, hemoglobin 11.5, BMP unremarkable.  RVP negative for COVID, flu, RSV CT chest abdomen pelvis with contrast shows multiple acute and nonacute ribs, notably with left 7th through 10th rib fractures, no pneumothorax noted. I agree with the radiologist interpretation.  Cardiac monitoring: EKG obtained and interpreted by myself and attending physician which shows: Normal sinus rhythm, no acute ST-T changes   Medications: I  ordered medication including Dilaudid  for pain, ordered incentive spirometry for rib fractures.  I have reviewed the patients home medicines and have made adjustments as needed.  Consults: Spoke with trauma surgery, Dr. Ann, who after discussion of multiple rib fractures reports okay for medicine admission for pain control.  Spoke with hospitalist, Franky who agrees to admission at this time.   Disposition: After consideration of the diagnostic results and the patients response to treatment, I feel that patient would benefit from admission as above.   Final diagnoses:  Fall, initial encounter  Closed fracture of multiple ribs of left side, initial encounter    ED Discharge Orders     None          Kayla Sherlean DEL, PA-C 06/29/24 2007    Kayla Bennett, [place] 06/30/24 [phone]

## 2024-06-30 DIAGNOSIS — I5032 Chronic diastolic (congestive) heart failure: Secondary | ICD-10-CM | POA: Insufficient documentation

## 2024-06-30 DIAGNOSIS — S2242XA Multiple fractures of ribs, left side, initial encounter for closed fracture: Secondary | ICD-10-CM | POA: Diagnosis not present

## 2024-06-30 DIAGNOSIS — W19XXXA Unspecified fall, initial encounter: Secondary | ICD-10-CM | POA: Diagnosis not present

## 2024-06-30 LAB — CBC
HCT: 36 % (ref 36.0–46.0)
Hemoglobin: 11.5 g/dL — ABNORMAL LOW (ref 12.0–15.0)
MCH: 31.5 pg (ref 26.0–34.0)
MCHC: 31.9 g/dL (ref 30.0–36.0)
MCV: 98.6 fL (ref 80.0–100.0)
Platelets: 159 K/uL (ref 150–400)
RBC: 3.65 MIL/uL — ABNORMAL LOW (ref 3.87–5.11)
RDW: 13.2 % (ref 11.5–15.5)
WBC: 6.2 K/uL (ref 4.0–10.5)
nRBC: 0 % (ref 0.0–0.2)

## 2024-06-30 LAB — GLUCOSE, CAPILLARY
Glucose-Capillary: 138 mg/dL — ABNORMAL HIGH (ref 70–99)
Glucose-Capillary: 172 mg/dL — ABNORMAL HIGH (ref 70–99)
Glucose-Capillary: 156 mg/dL — ABNORMAL HIGH (ref 70–99)
Glucose-Capillary: 145 mg/dL — ABNORMAL HIGH (ref 70–99)

## 2024-06-30 LAB — BASIC METABOLIC PANEL WITH GFR
Anion gap: 11 (ref 5–15)
BUN: 17 mg/dL (ref 8–23)
CO2: 24 mmol/L (ref 22–32)
Calcium: 8.9 mg/dL (ref 8.9–10.3)
Chloride: 101 mmol/L (ref 98–111)
Creatinine, Ser: 0.73 mg/dL (ref 0.44–1.00)
GFR, Estimated: >60 mL/min (ref >60)
Glucose, Bld: 129 mg/dL — ABNORMAL HIGH (ref 70–99)
Potassium: 4.3 mmol/L (ref 3.5–5.1)
Sodium: 136 mmol/L (ref 135–145)

## 2024-06-30 LAB — HEMOGLOBIN A1C
Hgb A1c MFr Bld: 6.5 % — ABNORMAL HIGH (ref 4.8–5.6)
Mean Plasma Glucose: 139.85 mg/dL

## 2024-06-30 MED ORDER — KETOROLAC TROMETHAMINE 15 MG/ML IJ SOLN
15.0000 mg | Freq: Four times a day (QID) | INTRAMUSCULAR | Status: DC
  Administered 2024-06-30 – 2024-07-02 (×7): 15 mg via INTRAVENOUS
  Filled 2024-06-30 (×7): qty 1

## 2024-06-30 MED ORDER — SIMETHICONE 80 MG PO CHEW
80.0000 mg | CHEWABLE_TABLET | Freq: Four times a day (QID) | ORAL | Status: DC | PRN
  Administered 2024-06-30: 80 mg via ORAL
  Filled 2024-06-30: qty 1

## 2024-06-30 MED ORDER — METHOCARBAMOL 500 MG PO TABS
500.0000 mg | ORAL_TABLET | Freq: Four times a day (QID) | ORAL | Status: DC
  Administered 2024-06-30 – 2024-07-04 (×16): 500 mg via ORAL
  Filled 2024-06-30 (×16): qty 1

## 2024-06-30 MED ORDER — ALUM & MAG HYDROXIDE-SIMETH 200-200-20 MG/5ML PO SUSP: 15.0000 mL | ORAL | Status: DC | PRN

## 2024-06-30 MED ORDER — HYDROMORPHONE HCL 1 MG/ML IJ SOLN
0.5000 mg | Freq: Once | INTRAMUSCULAR | Status: AC
  Administered 2024-06-30: 0.5 mg via INTRAVENOUS
  Filled 2024-06-30: qty 0.5

## 2024-06-30 NOTE — TOC Initial Note (Signed)
 Transition of Care [facility]) - Initial/Assessment Note    Patient Details  Name: Kayla Bennett MRN: [id number] Date of Birth: 25-Aug-1951  Transition of [facility]) CM/SW Contact:    Jeoffrey LITTIE Moose, LCSW Phone Number: 06/30/2024, 8:49 AM  Clinical Narrative:                 Pt admitted from Dr office after falling on her left side. No current TOC needs please consult as needs arise following therapy eval.        Patient Goals and CMS Choice            Expected Discharge Plan and Services                                              Prior Living Arrangements/Services                       Activities of Daily Living   ADL Screening (condition at time of admission) Independently performs ADLs?: Yes (appropriate for developmental age) Is the patient deaf or have difficulty hearing?: No Does the patient have difficulty seeing, even when wearing glasses/contacts?: Yes Does the patient have difficulty concentrating, remembering, or making decisions?: No  Permission Sought/Granted                  Emotional Assessment              Admission diagnosis:  Rib fractures [S22.49XA] Fall, initial encounter [W19.XXXA] Closed fracture of multiple ribs of left side, initial encounter [S22.42XA] Multiple rib fractures [S22.49XA] Patient Active Problem List   Diagnosis Date Noted   Diastolic dysfunction with chronic heart failure (HCC) 06/30/2024   Rib fractures 06/29/2024   Anemia 06/29/2024   Multiple rib fractures 06/29/2024   DDD (degenerative disc disease), cervical 10/09/2023   Rib pain on left side 04/08/2023   Hypokalemia 08/22/2022   Hypomagnesemia 08/22/2022   Balance problem 06/25/2022   Nausea 10/12/2021   Headache 10/12/2021   Generalized weakness 10/12/2021   Type 2 diabetes mellitus with obesity (HCC)    Hypertension    GERD (gastroesophageal reflux disease)    Iron deficiency anemia 09/25/2021   Secondary hypercoagulable state  (HCC) 07/26/2021   Persistent atrial fibrillation (HCC)    Genetic testing 12/26/2020   Malignant neoplasm of upper-outer quadrant of right breast in female, estrogen receptor positive (HCC) 11/28/2020   PCP:  Alvia Bring, DO Pharmacy:   [facility] - [place], [place] - [address] [place] [address] Phone: [phone] Fax: [phone]     Social Drivers of Health (SDOH) Social History: SDOH Screenings   Food Insecurity: No Food Insecurity (06/29/2024)  Housing: Low Risk  (06/29/2024)  Transportation Needs: No Transportation Needs (06/29/2024)  Utilities: Not At Risk (06/29/2024)  Alcohol Screen: Low Risk  (12/01/2023)  Depression (PHQ2-9): Low Risk  (04/07/2024)  Financial Resource Strain: Low Risk  (04/06/2024)  Physical Activity: Inactive (04/06/2024)  Social Connections: Moderately Isolated (06/29/2024)  Stress: No Stress Concern Present (04/06/2024)  Tobacco Use: Low Risk  (06/29/2024)  Health Literacy: Adequate Health Literacy (12/01/2023)   SDOH Interventions:     Readmission Risk Interventions    10/17/2021   11:29 AM  Readmission Risk Prevention Plan  Transportation Screening Complete  PCP or Specialist Appt within 5-7 Days Complete  Home Care Screening Complete  Medication Review (RN CM) Complete

## 2024-06-30 NOTE — Plan of Care (Signed)

## 2024-06-30 NOTE — TOC CAGE-AID Note (Signed)
 Transition of Care [facility]) - CAGE-AID Screening   Patient Details  Name: Kayla Bennett MRN: [id number] Date of Birth: 1951-02-05  Transition of Care [facility]) CM/SW Contact:    Kawana Hegel E Chi Garlow, LCSW Phone Number: 06/30/2024, 10:03 AM   Clinical Narrative: No SA noted.   CAGE-AID Screening:    Have You Ever Felt You Ought to Cut Down on Your Drinking or Drug Use?: No Have People Annoyed You By Critizing Your Drinking Or Drug Use?: No Have You Felt Bad Or Guilty About Your Drinking Or Drug Use?: No Have You Ever Had a Drink or Used Drugs First Thing In The Morning to Steady Your Nerves or to Get Rid of a Hangover?: No CAGE-AID Score: 0  Substance Abuse Education Offered: No

## 2024-06-30 NOTE — Evaluation (Signed)
 Physical Therapy Evaluation Patient Details Name: Kayla Bennett MRN: [id number] DOB: May 29, 1951 Today's Date: 06/30/2024  History of Present Illness  Kayla Bennett is a 73 y.o. female who presented to [facility] ED 06/29/24 s/p fall at the doctor's office after she lost her balance. CT scan shows features concerning for multiple rib fractures on the left side and also old rib fractures on the right side. PMHx: T2DM, persistent A-Fib, HTN, breast cancer s/p lumpectomy and radiation in 2022, sleep apnea, osteopenia, chronic anemia, balance issues, and chronic dyspnea.   Clinical Impression  Pt admitted with above diagnosis. PTA, pt was independent for functional mobility, ADLs, most IADLs, retired, and driving. She lives with her husband in a one level house with a level entry. Examination limited by pain. Pt currently with functional limitations due to the deficits listed below (see PT Problem List). She refused bed mobility. Pt demonstrated limited UE/LE AROM d/t pain. Educated pt on the importance of participating in therapy session to prevent the negative effects associated with bed rest such as deconditioning or pressure wounds. Pt verbalized understanding and reported she would be willing to try mobilizing tomorrow. Educated pt on incentive spirometer and instructed her to complete 10 reps every hour she is awake. Encouraged pt to continue to elevate bed to be in more of a chair position. Pt will benefit from acute skilled PT to increase her independence and safety with mobility to allow discharge. Anticipate once pain is better managed pt will be able to progress well and return home with HHPT to increase ROM/strength, improve balance, decrease fall risk, and optimize safety within the home environment.    If plan is discharge home, recommend the following: A lot of help with walking and/or transfers;A lot of help with bathing/dressing/bathroom;Assistance with cooking/housework;Assist for  transportation   Can travel by private vehicle        Equipment Recommendations Wheelchair (measurements PT)  Recommendations for Other Services  OT consult    Functional Status Assessment Patient has had a recent decline in their functional status and demonstrates the ability to make significant improvements in function in a reasonable and predictable amount of time.     Precautions / Restrictions Precautions Precautions: Fall Recall of Precautions/Restrictions: Intact Restrictions Weight Bearing Restrictions Per Provider Order: No      Mobility  Bed Mobility               General bed mobility comments: Pt refused bed mobility d/t 8/10 left flank pain and intermittent spasms. She reports getting up with nursing either and it being unbearable.    Transfers                        Ambulation/Gait                  Stairs            Wheelchair Mobility     Tilt Bed    Modified Rankin (Stroke Patients Only)       Balance Overall balance assessment: Needs assistance (Session remained bed level)                                           Pertinent Vitals/Pain Pain Assessment Pain Assessment: 0-10 Pain Score: 8  Pain Location: Ribs Pain Descriptors / Indicators: Spasm, Grimacing, Guarding, Moaning, Restless Pain Intervention(s): Monitored during  session, Limited activity within patient's tolerance, Repositioned, Patient requesting pain meds-RN notified    Home Living Family/patient expects to be discharged to:: Private residence Living Arrangements: Spouse/significant other Available Help at Discharge: Family;Available PRN/intermittently (Husband is retired, but goes out working to assist a Nutritional therapist friend. Brother lives close by, but just had knee surgery. Sister lives 20 miles away, but will call to check-in frequently and stop by 1-2x/wk.) Type of Home: House Home Access: Level entry       Home Layout: One  level Home Equipment: Grab bars - tub/shower;Hand held shower head;Cane - single point;Rolling Walker (2 wheels);Shower seat;BSC/3in1;Other (comment) (bed rails; knee scooter)      Prior Function Prior Level of Function : Independent/Modified Independent;History of Falls (last six months);Driving             Mobility Comments: Ambulates without AD. 2 falls in the past 76mo, one out of bed and the other that led to current admission. ADLs Comments: Indep with ADLs. Manages her own medications. Pt reports she does some simple cooking. Pt doesn't complete the household cleaning, had a maid but she recently moved to [facility] and she is working on getting a new person set-up. Drives.     Extremity/Trunk Assessment   Upper Extremity Assessment Upper Extremity Assessment: Defer to OT evaluation    Lower Extremity Assessment Lower Extremity Assessment: Generalized weakness;RLE deficits/detail;LLE deficits/detail RLE Deficits / Details: Decreased AROM d/t pain. Pt guarding during PROM d/t pain. Grossly 3/5 strength. RLE: Unable to fully assess due to pain RLE Sensation: WNL RLE Coordination: decreased gross motor LLE Deficits / Details: Decreased AROM d/t pain. Pt guarding during PROM d/t pain. Grossly 3/5 strength. LLE: Unable to fully assess due to pain LLE Sensation: WNL LLE Coordination: decreased gross motor       Communication   Communication Communication: No apparent difficulties    Cognition Arousal: Alert Behavior During Therapy: WFL for tasks assessed/performed   PT - Cognitive impairments: No apparent impairments                       PT - Cognition Comments: Pt A,Ox4 Following commands: Intact       Cueing Cueing Techniques: Verbal cues     General Comments General comments (skin integrity, edema, etc.): Pt greeted wearing 2L O2 via [place]. She reports wearing oxygen  at home only when she is sleeping usually. VSS.    Exercises Other Exercises Other  Exercises: Incentive Spirometer: Educated pt on proper technique. She completed 10 reps reaching 250 consistently. Set the goal for 500-776mL. Instructed pt to complete 10 reps every hour she is awake.   Assessment/Plan    PT Assessment Patient needs continued PT services  PT Problem List Decreased strength;Decreased range of motion;Decreased activity tolerance;Decreased balance;Decreased mobility;Decreased knowledge of use of DME;Decreased safety awareness;Pain       PT Treatment Interventions DME instruction;Gait training;Functional mobility training;Therapeutic activities;Therapeutic exercise;Balance training;Patient/family education;Wheelchair mobility training    PT Goals (Current goals can be found in the Care Plan section)  Acute Rehab PT Goals Patient Stated Goal: Have pain under control so I can safely return Home PT Goal Formulation: With patient Time For Goal Achievement: 07/14/24 Potential to Achieve Goals: Good    Frequency Min 2X/week     Co-evaluation               AM-PAC PT 6 Clicks Mobility  Outcome Measure Help needed turning from your back to your side while in a flat  bed without using bedrails?: Total Help needed moving from lying on your back to sitting on the side of a flat bed without using bedrails?: Total Help needed moving to and from a bed to a chair (including a wheelchair)?: Total Help needed standing up from a chair using your arms (e.g., wheelchair or bedside chair)?: Total Help needed to walk in hospital room?: Total Help needed climbing 3-5 steps with a railing? : Total 6 Click Score: 6    End of Session Equipment Utilized During Treatment: Oxygen  Activity Tolerance: Patient limited by pain Patient left: in bed;with call bell/phone within reach;with bed alarm set Nurse Communication: Mobility status;Patient requests pain meds;Other (comment);Need for lift equipment (Pt would benefit from a recliner chair, which is not present in her room.  Would recommend maximove currently as pt isn't participating in mobility d/t pain.) PT Visit Diagnosis: Pain;Muscle weakness (generalized) (M62.81);Difficulty in walking, not elsewhere classified (R26.2);Unsteadiness on feet (R26.81);Other abnormalities of gait and mobility (R26.89) Pain - Right/Left: Left Pain - part of body:  (Ribs)    Time: 8681-8655 PT Time Calculation (min) (ACUTE ONLY): 26 min   Charges:   PT Evaluation $PT Eval Moderate Complexity: 1 Mod PT Treatments $Therapeutic Exercise: 8-22 mins PT General Charges $$ ACUTE PT VISIT: 1 Visit         Randall SAUNDERS, PT, DPT Acute Rehabilitation Services Office: [phone] Secure Chat Preferred  Delon CHRISTELLA Callander 06/30/2024, 2:26 PM

## 2024-06-30 NOTE — Progress Notes (Signed)
 [facility]  PROGRESS NOTE  Kayla Bennett FMW:[id number] DOB: 1951/06/01 DOA: 06/29/2024 PCP: Kayla Bring, DO   Brief HPI:   73 y.o. female with history of diabetes mellitus type 2, persistent atrial fibrillation, hypertension, history of breast cancer status postlumpectomy and radiation in 2022, sleep apnea, chronic anemia, balance issues, chronic dyspnea had a fall at the doctor's office when patient was trying to turn around.  Denies hitting her head or losing consciousness.  She fell onto her left side and hurt the left side of the chest.   ED Course: In the ER patient had CT scan done of her chest abdomen and pelvis which shows features concerning for multiple rib fractures on the left side.  There are also old rib fractures on the right side.  Trauma surgeon was consulted and requested admission to hospitalist since there was no displaced fractures or pleural effusion.  Labs show hemoglobin of 11.5 creatinine 0.9 blood glucose 155 EKG shows normal sinus rhythm.    Assessment/Plan:   Multiple rib fractures on the left side after mechanical fall.  Patient admitted for pain control.  Will change Robaxin  to 500 mg scheduled 4 times daily, gabapentin  300 mg daily, oxycodone  5 mg every 6 hours as needed   Will keep patient on pain relief medication muscle relaxant physical therapy consult incentive spirometer.  There are multiple old rib fractures on the right side.  PT/OT evaluation obtained.  Recommend to go home with home health PT/OT  Paroxysmal atrial fibrillation presently in sinus rhythm takes Eliquis  and metoprolol .  Hypertension on losartan   Chronic diastolic dysfunction on Lasix .  Last 2D echo done in April showed EF of 60 to 65% with grade 2 diastolic dysfunction.  Sleep apnea on BiPAP at bedtime.  Hyperlipidemia on Repatha .  Chronic anemia follow CBC.  Diabetes mellitus type 2 takes metformin .  Last hemoglobin A1c was 6.3.  On sliding scale coverage.  History of  breast cancer status postlumpectomy radiation therapy on tamoxifen .  Chronic pain on Effexor  and gabapentin .  Anemia appears to be chronic follow CBC.    Medications     apixaban   5 mg Oral BID   calcium  carbonate  500 mg Oral q AM   cholecalciferol   1,000 Units Oral Daily   furosemide   40 mg Oral Daily   gabapentin   300 mg Oral QHS   insulin  aspart  0-9 Units Subcutaneous TID WC   losartan   100 mg Oral q AM   metoprolol  succinate  100 mg Oral q AM   pantoprazole   40 mg Oral BID   potassium chloride  SA  20 mEq Oral Daily   venlafaxine  XR  112.5 mg Oral QHS     Data Reviewed:   CBG:  Recent Labs  Lab 06/30/24 0759  GLUCAP 138*    SpO2: 96 % O2 Flow Rate (L/min): 2 L/min    Vitals:   06/29/24 2150 06/30/24 0023 06/30/24 0531 06/30/24 0806  BP: (!) 169/64 123/68 135/67 (!) 120/59  Pulse: 87 83 70 65  Resp:  17 17 16   Temp: 97.7 F (36.5 C) 98 F (36.7 C) 98 F (36.7 C) 98 F (36.7 C)  TempSrc: Oral Oral Oral Oral  SpO2: 100% 98% 94% 96%  Weight:      Height:          Data Reviewed:  Basic Metabolic Panel: Recent Labs  Lab 06/29/24 1546 06/30/24 0645  NA 140 136  K 4.1 4.3  CL 102 101  CO2 27  24  GLUCOSE 155* 129*  BUN 20 17  CREATININE 0.95 0.73  CALCIUM  9.3 8.9    CBC: Recent Labs  Lab 06/29/24 1546 06/30/24 0645  WBC 6.1 6.2  HGB 11.5* 11.5*  HCT 36.4 36.0  MCV 98.4 98.6  PLT 168 159    LFT No results for input(s): AST, ALT, ALKPHOS, BILITOT, PROT, ALBUMIN in the last 168 hours.   Antibiotics: Anti-infectives (From admission, onward)    None        DVT prophylaxis: Apixaban   Code Status: Full code  Family Communication: No family at bedside   CONSULTS    Subjective   Complains of left chest wall pain   Objective    Physical Examination:   General-appears in no acute distress Heart-S1-S2, regular, no murmur auscultated Lungs-clear to auscultation bilaterally, no wheezing or crackles  auscultated Chest-tenderness to palpation at anterior left chest wall Abdomen-soft, nontender, no organomegaly Extremities-no edema in the lower extremities Neuro-alert, oriented x3, no focal deficit noted   Status is: Inpatient:             Kayla Bennett   [facility] If 7PM-7AM, please contact night-coverage at [email or web address], Office  [phone]   06/30/2024, 8:13 AM  LOS: 0 days

## 2024-06-30 NOTE — Care Management Obs Status (Signed)
 MEDICARE OBSERVATION STATUS NOTIFICATION   Patient Details  Name: Kayla Bennett MRN: [id number] Date of Birth: 09/09/1951   Medicare Observation Status Notification Given:  Yes    Jon Cruel 06/30/2024, 2:14 PM

## 2024-07-01 DIAGNOSIS — S2242XA Multiple fractures of ribs, left side, initial encounter for closed fracture: Secondary | ICD-10-CM | POA: Diagnosis present

## 2024-07-01 DIAGNOSIS — Z888 Allergy status to other drugs, medicaments and biological substances status: Secondary | ICD-10-CM | POA: Diagnosis not present

## 2024-07-01 DIAGNOSIS — Z7984 Long term (current) use of oral hypoglycemic drugs: Secondary | ICD-10-CM | POA: Diagnosis not present

## 2024-07-01 DIAGNOSIS — E66812 Obesity, class 2: Secondary | ICD-10-CM | POA: Diagnosis present

## 2024-07-01 DIAGNOSIS — I110 Hypertensive heart disease with heart failure: Secondary | ICD-10-CM | POA: Diagnosis present

## 2024-07-01 DIAGNOSIS — E785 Hyperlipidemia, unspecified: Secondary | ICD-10-CM | POA: Diagnosis present

## 2024-07-01 DIAGNOSIS — M1990 Unspecified osteoarthritis, unspecified site: Secondary | ICD-10-CM | POA: Diagnosis present

## 2024-07-01 DIAGNOSIS — Z7901 Long term (current) use of anticoagulants: Secondary | ICD-10-CM | POA: Diagnosis not present

## 2024-07-01 DIAGNOSIS — M797 Fibromyalgia: Secondary | ICD-10-CM | POA: Diagnosis present

## 2024-07-01 DIAGNOSIS — Z6836 Body mass index (BMI) 36.0-36.9, adult: Secondary | ICD-10-CM | POA: Diagnosis not present

## 2024-07-01 DIAGNOSIS — Y92531 Health care provider office as the place of occurrence of the external cause: Secondary | ICD-10-CM | POA: Diagnosis not present

## 2024-07-01 DIAGNOSIS — W1830XA Fall on same level, unspecified, initial encounter: Secondary | ICD-10-CM | POA: Diagnosis present

## 2024-07-01 DIAGNOSIS — Z8249 Family history of ischemic heart disease and other diseases of the circulatory system: Secondary | ICD-10-CM | POA: Diagnosis not present

## 2024-07-01 DIAGNOSIS — W19XXXA Unspecified fall, initial encounter: Secondary | ICD-10-CM | POA: Diagnosis not present

## 2024-07-01 DIAGNOSIS — Z923 Personal history of irradiation: Secondary | ICD-10-CM | POA: Diagnosis not present

## 2024-07-01 DIAGNOSIS — E119 Type 2 diabetes mellitus without complications: Secondary | ICD-10-CM | POA: Diagnosis present

## 2024-07-01 DIAGNOSIS — Z881 Allergy status to other antibiotic agents status: Secondary | ICD-10-CM | POA: Diagnosis not present

## 2024-07-01 DIAGNOSIS — Z853 Personal history of malignant neoplasm of breast: Secondary | ICD-10-CM | POA: Diagnosis not present

## 2024-07-01 DIAGNOSIS — Z79899 Other long term (current) drug therapy: Secondary | ICD-10-CM | POA: Diagnosis not present

## 2024-07-01 DIAGNOSIS — E1169 Type 2 diabetes mellitus with other specified complication: Secondary | ICD-10-CM | POA: Diagnosis not present

## 2024-07-01 DIAGNOSIS — Z885 Allergy status to narcotic agent status: Secondary | ICD-10-CM | POA: Diagnosis not present

## 2024-07-01 DIAGNOSIS — G8929 Other chronic pain: Secondary | ICD-10-CM | POA: Diagnosis present

## 2024-07-01 DIAGNOSIS — S2249XA Multiple fractures of ribs, unspecified side, initial encounter for closed fracture: Secondary | ICD-10-CM | POA: Diagnosis present

## 2024-07-01 DIAGNOSIS — I5032 Chronic diastolic (congestive) heart failure: Secondary | ICD-10-CM | POA: Diagnosis present

## 2024-07-01 DIAGNOSIS — D638 Anemia in other chronic diseases classified elsewhere: Secondary | ICD-10-CM | POA: Diagnosis present

## 2024-07-01 DIAGNOSIS — Z91018 Allergy to other foods: Secondary | ICD-10-CM | POA: Diagnosis not present

## 2024-07-01 DIAGNOSIS — Z833 Family history of diabetes mellitus: Secondary | ICD-10-CM | POA: Diagnosis not present

## 2024-07-01 DIAGNOSIS — G4733 Obstructive sleep apnea (adult) (pediatric): Secondary | ICD-10-CM | POA: Diagnosis present

## 2024-07-01 DIAGNOSIS — I4819 Other persistent atrial fibrillation: Secondary | ICD-10-CM | POA: Diagnosis present

## 2024-07-01 LAB — BASIC METABOLIC PANEL WITH GFR
Anion gap: 11 (ref 5–15)
BUN: 15 mg/dL (ref 8–23)
CO2: 28 mmol/L (ref 22–32)
Calcium: 8.9 mg/dL (ref 8.9–10.3)
Chloride: 101 mmol/L (ref 98–111)
Creatinine, Ser: 0.79 mg/dL (ref 0.44–1.00)
GFR, Estimated: >60 mL/min (ref >60)
Glucose, Bld: 150 mg/dL — ABNORMAL HIGH (ref 70–99)
Potassium: 3.8 mmol/L (ref 3.5–5.1)
Sodium: 140 mmol/L (ref 135–145)

## 2024-07-01 LAB — GLUCOSE, CAPILLARY
Glucose-Capillary: 180 mg/dL — ABNORMAL HIGH (ref 70–99)
Glucose-Capillary: 153 mg/dL — ABNORMAL HIGH (ref 70–99)
Glucose-Capillary: 131 mg/dL — ABNORMAL HIGH (ref 70–99)
Glucose-Capillary: 145 mg/dL — ABNORMAL HIGH (ref 70–99)

## 2024-07-01 MED ORDER — LIDOCAINE 5 % EX PTCH
1.0000 | MEDICATED_PATCH | CUTANEOUS | Status: DC
  Administered 2024-07-01 – 2024-07-07 (×7): 1 via TRANSDERMAL
  Filled 2024-07-01 (×7): qty 1

## 2024-07-01 MED ORDER — OXYCODONE HCL 5 MG PO TABS
10.0000 mg | ORAL_TABLET | Freq: Four times a day (QID) | ORAL | Status: DC | PRN
  Administered 2024-07-01 – 2024-07-06 (×9): 10 mg via ORAL
  Filled 2024-07-01 (×10): qty 2

## 2024-07-01 NOTE — Progress Notes (Signed)
 Pt placed on CPAP at this time.   07/01/24 2354  BiPAP/CPAP/SIPAP  $ Non-Invasive Ventilator  Non-Invasive Vent Subsequent  $ Face Mask Small Yes  BiPAP/CPAP/SIPAP Pt Type Adult  BiPAP/CPAP/SIPAP Resmed  Mask Type Full face mask  Mask Size Small  Respiratory Rate 16 breaths/min  IPAP 10 cmH20  EPAP 5 cmH2O  Flow Rate 2 lpm  Patient Home Machine No  Patient Home Mask No  Patient Home Tubing No  Auto Titrate No  Nasal massage performed Yes  CPAP/SIPAP surface wiped down Yes  Device Plugged into RED Power Outlet Yes

## 2024-07-01 NOTE — TOC Progression Note (Signed)
 Transition of Care [facility]) - Progression Note    Patient Details  Name: Kayla Bennett MRN: [id number] Date of Birth: [birth year]  Transition of Care [facility]) CM/SW Contact  Nola Devere Hands, RN Phone Number: 07/01/2024, 12:09 PM  Clinical Narrative:    Case manager spoke with patient concerning discharge needs. Discussed options for [facility] agencies. Patient states that after talking with the therapist, she is going to need shortterm rehab. Patient is waiting to discuss with Child psychotherapist. Should patient decide to go home, [facility] is agency of choice. TOC Team will continue to fololow.                     Expected Discharge Plan and Services                                               Social Drivers of Health (SDOH) Interventions SDOH Screenings   Food Insecurity: No Food Insecurity (06/29/2024)  Housing: Low Risk  (06/29/2024)  Transportation Needs: No Transportation Needs (06/29/2024)  Utilities: Not At Risk (06/29/2024)  Alcohol Screen: Low Risk  (12/01/2023)  Depression (PHQ2-9): Low Risk  (04/07/2024)  Financial Resource Strain: Low Risk  (04/06/2024)  Physical Activity: Inactive (04/06/2024)  Social Connections: Moderately Isolated (06/29/2024)  Stress: No Stress Concern Present (04/06/2024)  Tobacco Use: Low Risk  (06/29/2024)  Health Literacy: Adequate Health Literacy (12/01/2023)    Readmission Risk Interventions    10/17/2021   11:29 AM  Readmission Risk Prevention Plan  Transportation Screening Complete  PCP or Specialist Appt within 5-7 Days Complete  Home Care Screening Complete  Medication Review (RN CM) Complete

## 2024-07-01 NOTE — TOC Progression Note (Signed)
 Transition of Care [facility]) - Progression Note    Patient Details  Name: Kayla Bennett MRN: [id number] Date of Birth: 1951-11-14  Transition of Care [facility]) CM/SW Contact  Gwenn Julien Norris, [place] Phone Number: 07/01/2024, 12:31 PM  Clinical Narrative: Therapy recommendation for SNF received. Pt is currently Medicare OBS with no qualifying stay but is eligible for [facility] waiver for SNF placement. Pt aware of recommendation for SNF and reports agreeable. Will begin SNF search and f/u with offers as available. Will need to update [facility] with facility choice prior to dc.   Julien Gwenn, MSW, LCSW [phone] (coverage)                          Expected Discharge Plan and Services                                               Social Drivers of Health (SDOH) Interventions SDOH Screenings   Food Insecurity: No Food Insecurity (06/29/2024)  Housing: Low Risk  (06/29/2024)  Transportation Needs: No Transportation Needs (06/29/2024)  Utilities: Not At Risk (06/29/2024)  Alcohol Screen: Low Risk  (12/01/2023)  Depression (PHQ2-9): Low Risk  (04/07/2024)  Financial Resource Strain: Low Risk  (04/06/2024)  Physical Activity: Inactive (04/06/2024)  Social Connections: Moderately Isolated (06/29/2024)  Stress: No Stress Concern Present (04/06/2024)  Tobacco Use: Low Risk  (06/29/2024)  Health Literacy: Adequate Health Literacy (12/01/2023)    Readmission Risk Interventions    10/17/2021   11:29 AM  Readmission Risk Prevention Plan  Transportation Screening Complete  PCP or Specialist Appt within 5-7 Days Complete  Home Care Screening Complete  Medication Review (RN CM) Complete

## 2024-07-01 NOTE — Progress Notes (Signed)
 Orthopedic Tech Progress Note Patient Details:  Kayla Bennett [birth year] [id number]  Patient ID: Kayla Bennett, female   DOB: 23-Jan-1951, 73 y.o.   MRN: [id number] Delivered abdominal binder to pt. Bedside. Adine MARLA Blush 07/01/2024, 11:43 AM

## 2024-07-01 NOTE — Plan of Care (Signed)

## 2024-07-01 NOTE — Progress Notes (Signed)
 PROGRESS NOTE    Kayla Bennett  FMW:[id number] DOB: 1951/02/09 DOA: 06/29/2024 PCP: Alvia Bring, DO  Brief Narrative:73 y.o. female with history of diabetes mellitus type 2, persistent atrial fibrillation, hypertension, history of breast cancer status postlumpectomy and radiation in 2022, sleep apnea, chronic anemia, balance issues, chronic dyspnea had a fall at the doctor's office when patient was trying to turn around.  Denies hitting her head or losing consciousness.  She fell onto her left side and hurt the left side of the chest.   ED Course: In the ER patient had CT scan done of her chest abdomen and pelvis which shows features concerning for multiple rib fractures on the left side.  There are also old rib fractures on the right side.  Trauma surgeon was consulted and requested admission to hospitalist since there was no displaced fractures or pleural effusion.  Labs show hemoglobin of 11.5 creatinine 0.9 blood glucose 155 EKG shows normal sinus rhythm.  Assessment & Plan:   Principal Problem:   Rib fractures Active Problems:   Malignant neoplasm of upper-outer quadrant of right breast in female, estrogen receptor positive (HCC)   Persistent atrial fibrillation (HCC)   Type 2 diabetes mellitus with obesity (HCC)   Hypertension   Anemia   Multiple rib fractures   Diastolic dysfunction with chronic heart failure (HCC)   Multiple rib fractures on the left side after mechanical fall.  Patient admitted for pain control.  Continue oxycodone  dose increased to 10 mg, Robaxin , gabapentin , PT OT, lidocaine  patch and abdominal binder.   Paroxysmal atrial fibrillation presently in sinus rhythm takes Eliquis  and metoprolol .   Hypertension on losartan    Chronic diastolic dysfunction on Lasix .  Last 2D echo done in April showed EF of 60 to 65% with grade 2 diastolic dysfunction.   Sleep apnea on BiPAP at bedtime.   Hyperlipidemia on Repatha .   Chronic anemia follow CBC.   Diabetes  mellitus type 2 takes metformin .  Last hemoglobin A1c was 6.3.  On sliding scale coverage.   History of breast cancer status postlumpectomy radiation therapy on tamoxifen .   Chronic pain on Effexor  and gabapentin .   Anemia stable      Estimated body mass index is 36.58 kg/m as calculated from the following:   Height as of this encounter: 5' 2 (1.575 m).   Weight as of this encounter: 90.7 kg.  DVT prophylaxis: Eliquis  Code Status: Full code Family Communication: None  disposition Plan:  Status is: Inpatient  consultants:  PT OT  Procedures: None none Antimicrobials: None  Subjective: She reports severe pain unable to sit up on her own seen by PT requiring IV narcotics for pain control did not sleep well due to pain   Objective: Vitals:   06/30/24 1516 06/30/24 1945 07/01/24 0400 07/01/24 0853  BP: 138/62 (!) 154/76 (!) 136/59 (!) 150/74  Pulse: 73 73 61 (!) 53  Resp: 16 18 18 16   Temp: 98 F (36.7 C) 98.2 F (36.8 C) (!) 97.5 F (36.4 C) 98.1 F (36.7 C)  TempSrc:  Oral Oral   SpO2: 98% 98% 96% 99%  Weight:      Height:        Intake/Output Summary (Last 24 hours) at 07/01/2024 1332 Last data filed at 07/01/2024 0854 Gross per 24 hour  Intake 120 ml  Output --  Net 120 ml   Filed Weights   06/29/24 1510  Weight: 90.7 kg    Examination:  General exam: Appears in distress due  to pain Respiratory system: Clear to auscultation. Respiratory effort normal. Cardiovascular system: Irregular  gastrointestinal system: Abdomen is nondistended, soft and nontender. No organomegaly or masses felt. Normal bowel sounds heard. Central nervous system: Alert and oriented. No focal neurological deficits. Extremities: No edema.     Data Reviewed: I have personally reviewed following labs and imaging studies  CBC: Recent Labs  Lab 06/29/24 1546 06/30/24 0645  WBC 6.1 6.2  HGB 11.5* 11.5*  HCT 36.4 36.0  MCV 98.4 98.6  PLT 168 159   Basic Metabolic  Panel: Recent Labs  Lab 06/29/24 1546 06/30/24 0645 07/01/24 0714  NA 140 136 140  K 4.1 4.3 3.8  CL 102 101 101  CO2 27 24 28   GLUCOSE 155* 129* 150*  BUN 20 17 15   CREATININE 0.95 0.73 0.79  CALCIUM  9.3 8.9 8.9   GFR: Estimated Creatinine Clearance: 65.6 mL/min (by C-G formula based on SCr of 0.79 mg/dL). Liver Function Tests: No results for input(s): AST, ALT, ALKPHOS, BILITOT, PROT, ALBUMIN in the last 168 hours. No results for input(s): LIPASE, AMYLASE in the last 168 hours. No results for input(s): AMMONIA in the last 168 hours. Coagulation Profile: No results for input(s): INR, PROTIME in the last 168 hours. Cardiac Enzymes: No results for input(s): CKTOTAL, CKMB, CKMBINDEX, TROPONINI in the last 168 hours. BNP (last 3 results) No results for input(s): PROBNP in the last 8760 hours. HbA1C: Recent Labs    06/30/24 0645  HGBA1C 6.5*   CBG: Recent Labs  Lab 06/30/24 1111 06/30/24 1648 06/30/24 2156 07/01/24 0857 07/01/24 1200  GLUCAP 172* 156* 145* 180* 153*   Lipid Profile: No results for input(s): CHOL, HDL, LDLCALC, TRIG, CHOLHDL, LDLDIRECT in the last 72 hours. Thyroid  Function Tests: No results for input(s): TSH, T4TOTAL, FREET4, T3FREE, THYROIDAB in the last 72 hours. Anemia Panel: No results for input(s): VITAMINB12, FOLATE, FERRITIN, TIBC, IRON, RETICCTPCT in the last 72 hours. Sepsis Labs: No results for input(s): PROCALCITON, LATICACIDVEN in the last 168 hours.  Recent Results (from the past 240 hours)  Resp panel by RT-PCR (RSV, Flu A&B, Covid) Anterior Nasal Swab     Status: None   Collection Time: 06/29/24  3:46 PM   Specimen: Anterior Nasal Swab  Result Value Ref Range Status   SARS Coronavirus 2 by RT PCR NEGATIVE NEGATIVE Final   Influenza A by PCR NEGATIVE NEGATIVE Final   Influenza B by PCR NEGATIVE NEGATIVE Final    Comment: (NOTE) The Xpert Xpress  SARS-CoV-2/FLU/RSV plus assay is intended as an aid in the diagnosis of influenza from Nasopharyngeal swab specimens and should not be used as a sole basis for treatment. Nasal washings and aspirates are unacceptable for Xpert Xpress SARS-CoV-2/FLU/RSV testing.  Fact Sheet for Patients: BloggerCourse.com  Fact Sheet for Healthcare Providers: SeriousBroker.it  This test is not yet approved or cleared by the [place]  FDA and has been authorized for detection and/or diagnosis of SARS-CoV-2 by FDA under an Emergency Use Authorization (EUA). This EUA will remain in effect (meaning this test can be used) for the duration of the COVID-19 declaration under Section 564(b)(1) of the Act, 21 U.S.C. section 360bbb-3(b)(1), unless the authorization is terminated or revoked.     Resp Syncytial Virus by PCR NEGATIVE NEGATIVE Final    Comment: (NOTE) Fact Sheet for Patients: BloggerCourse.com  Fact Sheet for Healthcare Providers: SeriousBroker.it  This test is not yet approved or cleared by the [place]  FDA and has been authorized for detection and/or diagnosis of  SARS-CoV-2 by FDA under an Emergency Use Authorization (EUA). This EUA will remain in effect (meaning this test can be used) for the duration of the COVID-19 declaration under Section 564(b)(1) of the Act, 21 U.S.C. section 360bbb-3(b)(1), unless the authorization is terminated or revoked.  Performed at [facility], [address] [place], [place] [address]          Radiology Studies: CT T-SPINE NO CHARGE Result Date: 06/29/2024 CLINICAL DATA:  Recent fall while on blood thinners with known rib fractures on the left and back pain, initial encounter EXAM: CT THORACIC SPINE WITHOUT CONTRAST TECHNIQUE: Multidetector CT images of the thoracic were obtained using the standard protocol without intravenous contrast.  RADIATION DOSE REDUCTION: This exam was performed according to the departmental dose-optimization program which includes automated exposure control, adjustment of the mA and/or kV according to patient size and/or use of iterative reconstruction technique. COMPARISON:  CT from earlier in the same day, 04/04/2024 CT FINDINGS: Alignment: Within normal limits. Vertebrae: Vertebral body height is well maintained. Mild osteophytic changes are seen. No pedicle abnormality or paraspinal mass lesion is seen. Paraspinal and other soft tissues: Surrounding soft tissue structures show no acute abnormality. Multiple rib fractures are seen on the left similar to that noted on prior CT examination. Again no pneumothorax is seen. Disc levels: No significant disc pathology is identified. No significant central canal stenosis is noted. IMPRESSION: Degenerative changes of the thoracic spine without acute abnormality. Multiple left-sided rib fractures similar to that noted on prior CT examination. No pneumothorax is noted. Electronically Signed   By: Oneil Devonshire M.D.   On: 06/29/2024 20:52   CT CHEST ABDOMEN PELVIS W CONTRAST Result Date: 06/29/2024 CLINICAL DATA:  Recent fall while on blood thinners with chest and abdominal pain, initial encounter EXAM: CT CHEST, ABDOMEN, AND PELVIS WITH CONTRAST TECHNIQUE: Multidetector CT imaging of the chest, abdomen and pelvis was performed following the standard protocol during bolus administration of intravenous contrast. RADIATION DOSE REDUCTION: This exam was performed according to the departmental dose-optimization program which includes automated exposure control, adjustment of the mA and/or kV according to patient size and/or use of iterative reconstruction technique. CONTRAST:  75mL OMNIPAQUE  IOHEXOL  350 MG/ML SOLN COMPARISON:  04/04/2024 FINDINGS: CT CHEST FINDINGS Cardiovascular: Atherosclerotic calcifications of the thoracic aorta are noted. No aneurysmal dilatation is seen. The  heart is not significantly enlarged. The pulmonary artery is well visualized within normal enhancement pattern. No evidence of pulmonary emboli is noted although not timed for embolus evaluation. Mild coronary calcifications are noted. Mediastinum/Nodes: Thoracic inlet is within normal limits. No hilar or mediastinal adenopathy is seen. Small hiatal hernia is noted. The esophagus is otherwise within normal limits. Lungs/Pleura: Lungs are well aerated bilaterally. No focal infiltrate or sizable effusion is seen. No pneumothorax is noted. Musculoskeletal: Mildly displaced fractures of the left seventh through tenth ribs are seen. Healing rib fractures on the right are noted. No acute right rib fractures are seen. No compression deformity is noted. CT ABDOMEN PELVIS FINDINGS Hepatobiliary: No focal liver abnormality is seen. Status post cholecystectomy. No biliary dilatation. Pancreas: Unremarkable. No pancreatic ductal dilatation or surrounding inflammatory changes. Spleen: Normal in size without focal abnormality. Adrenals/Urinary Tract: Adrenal glands are within normal limits. Kidneys demonstrate a normal enhancement pattern bilaterally. No renal calculi are noted. No obstructive changes are seen. The bladder is well distended. Stomach/Bowel: Scattered fecal material is noted throughout the colon. No obstructive changes are noted. The appendix has been surgically removed. Small bowel and  stomach appear within normal limits aside from the previously mentioned hiatal hernia. Vascular/Lymphatic: Aortic atherosclerosis. No enlarged abdominal or pelvic lymph nodes. Reproductive: Status post hysterectomy. No adnexal masses. Other: No abdominal wall hernia or abnormality. No abdominopelvic ascites. Musculoskeletal: No acute bony abnormality is noted. IMPRESSION: CT of the chest: Multiple bilateral rib fractures, acute on the left and subacute on the right. No pneumothorax is seen.  No other focal abnormality is noted. CT  of the abdomen and pelvis: No acute abnormality is noted to correspond with the given clinical history. Electronically Signed   By: Oneil Devonshire M.D.   On: 06/29/2024 19:34     Scheduled Meds:  apixaban   5 mg Oral BID   calcium  carbonate  500 mg Oral q AM   cholecalciferol   1,000 Units Oral Daily   furosemide   40 mg Oral Daily   gabapentin   300 mg Oral QHS   insulin  aspart  0-9 Units Subcutaneous TID WC   ketorolac   15 mg Intravenous Q6H   losartan   100 mg Oral q AM   methocarbamol   500 mg Oral Q6H   metoprolol  succinate  100 mg Oral q AM   pantoprazole   40 mg Oral BID   potassium chloride  SA  20 mEq Oral Daily   venlafaxine  XR  112.5 mg Oral QHS   Continuous Infusions:   LOS: 0 days   Almarie KANDICE Hoots, MD 07/01/2024, 1:32 PM

## 2024-07-01 NOTE — Evaluation (Signed)
 Occupational Therapy Evaluation Patient Details Name: Kayla Bennett MRN: [id number] DOB: 12/08/1950 Today's Date: 07/01/2024   History of Present Illness   Kayla Bennett is a 73 y.o. female who presented to [facility] ED 06/29/24 s/p fall at the doctor's office after she lost her balance. CT scan shows features concerning for multiple rib fractures on the left side (7-10) and also old rib fractures on the right side. PMHx: T2DM, persistent A-Fib, HTN, breast cancer s/p lumpectomy and radiation in 2022, sleep apnea, osteopenia, chronic anemia, balance issues, and chronic dyspnea.     Clinical Impressions This 73 yo female admitted with above presents to acute OT with PLOF of being independent with basic ADLs and IADLs, but with history of falls. Currently she is setup-total A for all basic ADLs and cannot get OOB without too much pain due to rib fractures. She will continue to benefit from acute OT with follow up OT from continued inpatient follow up therapy, <3 hours/day.      If plan is discharge home, recommend the following:   A lot of help with walking and/or transfers;A lot of help with bathing/dressing/bathroom;Assistance with cooking/housework;Help with stairs or ramp for entrance;Assist for transportation     Functional Status Assessment   Patient has had a recent decline in their functional status and demonstrates the ability to make significant improvements in function in a reasonable and predictable amount of time.     Equipment Recommendations   Other (comment) (TBD next venue)      Precautions/Restrictions   Precautions Precautions: Fall Restrictions Weight Bearing Restrictions Per Provider Order: No     Mobility Bed Mobility Overal bed mobility: Needs Assistance Bed Mobility: Rolling Rolling: Min assist         General bed mobility comments: Pt agreeable to try to get up and OOB; due to pain and having to stop and restart multiple times it took her 30  minutes to get to left side lying and legs partially off the bed. Did encourage her to when she got to a point when the pain rared up again to try and maintain where she was not not go back to where she had moved from (she was able to do this with vc's)            ADL either performed or assessed with clinical judgement   ADL Overall ADL's : Needs assistance/impaired Eating/Feeding: Set up;Bed level   Grooming: Minimal assistance;Bed level   Upper Body Bathing: Moderate assistance;Bed level   Lower Body Bathing: Total assistance;Bed level   Upper Body Dressing : Total assistance;Bed level   Lower Body Dressing: Total assistance;Bed level                       Vision Baseline Vision/History: 1 Wears glasses Ability to See in Adequate Light: 0 Adequate Patient Visual Report: No change from baseline              Pertinent Vitals/Pain Pain Assessment Pain Assessment: 0-10 Pain Score: 10-Worst pain ever Pain Location: Ribs with some movements Pain Descriptors / Indicators: Spasm, Grimacing, Guarding, Moaning, Restless, Crying Pain Intervention(s): Limited activity within patient's tolerance, Monitored during session, Premedicated before session, Repositioned     Extremity/Trunk Assessment Upper Extremity Assessment Upper Extremity Assessment: Right hand dominant;LUE deficits/detail LUE Deficits / Details: Decreased AROM at shoulder due to prior fall LUE Coordination: decreased gross motor           Communication Communication Communication: No apparent  difficulties   Cognition Arousal: Alert Behavior During Therapy: WFL for tasks assessed/performed                                 Following commands: Intact       Cueing   Cueing Techniques: Verbal cues              Home Living Family/patient expects to be discharged to:: Private residence Living Arrangements: Spouse/significant other Available Help at Discharge:  Family;Available PRN/intermittently (Husband is retired, but goes out working to assist a Nutritional therapist friend. Brother lives close by, but just had knee surgery. Sister lives 20 miles away, but will call to check-in frequently and stop by 1-2x/wk.) Type of Home: House Home Access: Level entry     Home Layout: One level     Bathroom Shower/Tub: Tub/shower unit;Walk-in shower   Bathroom Toilet: Standard     Home Equipment: Grab bars - tub/shower;Hand held shower head;Cane - single point;Rolling Walker (2 wheels);Shower seat;BSC/3in1;Other (comment)          Prior Functioning/Environment Prior Level of Function : Independent/Modified Independent;History of Falls (last six months);Driving             Mobility Comments: Ambulates without AD. 2 falls in the past 85mo, one out of bed and the other that led to current admission. ADLs Comments: Indep with ADLs. Manages her own medications. Pt reports she does some simple cooking. Pt doesn't complete the household cleaning, had a maid but she recently moved to [facility] and she is working on getting a new person set-up. Drives.    OT Problem List: Decreased range of motion;Decreased activity tolerance;Pain;Obesity   OT Treatment/Interventions: Self-care/ADL training;DME and/or AE instruction;Balance training;Patient/family education      OT Goals(Current goals can be found in the care plan section)   Acute Rehab OT Goals Patient Stated Goal: for pain to be better controlled so I can get up and move OT Goal Formulation: With patient Time For Goal Achievement: 07/15/24 Potential to Achieve Goals: Good   OT Frequency:  Min 2X/week       AM-PAC OT 6 Clicks Daily Activity     Outcome Measure Help from another person eating meals?: A Little Help from another person taking care of personal grooming?: A Little Help from another person toileting, which includes using toliet, bedpan, or urinal?: Total Help from another person bathing (including  washing, rinsing, drying)?: A Lot Help from another person to put on and taking off regular upper body clothing?: Total Help from another person to put on and taking off regular lower body clothing?: Total 6 Click Score: 11   End of Session Nurse Communication:  (pt not tolerating movement with current pain medication regimen.)  Activity Tolerance: Patient limited by pain Patient left: in bed;with call bell/phone within reach;with family/visitor present  OT Visit Diagnosis: Other abnormalities of gait and mobility (R26.89);Muscle weakness (generalized) (M62.81);Pain Pain - Right/Left: Left Pain - part of body:  (ribs)                Time: 9041-8961 OT Time Calculation (min): 40 min Charges:  OT General Charges $OT Visit: 1 Visit OT Evaluation $OT Eval Moderate Complexity: 1 Mod OT Treatments $Self Care/Home Management : 23-37 mins  Donny BECKER OT Acute Rehabilitation Services Office [phone]    [place] Dorothyann Distel 07/01/2024, 1:15 PM

## 2024-07-01 NOTE — NC FL2 (Signed)
 [place]  MEDICAID FL2 LEVEL OF CARE FORM     IDENTIFICATION  Patient Name: Kayla Bennett Birthdate: [birth year] Sex: female Admission Date (Current Location): 06/29/2024  [facility] and [place] Number:  Producer, television/film/video and Address:  [facility], [address] [place], [place] [address]      Provider Number: [id number]  Attending Physician Name and Address:  Will Almarie MATSU, MD  Relative Name and Phone Number:       Current Level of Care: Hospital Recommended Level of Care: Skilled Nursing Facility Prior Approval Number:    Date Approved/Denied:   PASRR Number: [id number]  Discharge Plan: SNF    Current Diagnoses: Patient Active Problem List   Diagnosis Date Noted   Diastolic dysfunction with chronic heart failure (HCC) 06/30/2024   Rib fractures 06/29/2024   Anemia 06/29/2024   Multiple rib fractures 06/29/2024   DDD (degenerative disc disease), cervical 10/09/2023   Rib pain on left side 04/08/2023   Hypokalemia 08/22/2022   Hypomagnesemia 08/22/2022   Balance problem 06/25/2022   Nausea 10/12/2021   Headache 10/12/2021   Generalized weakness 10/12/2021   Type 2 diabetes mellitus with obesity (HCC)    Hypertension    GERD (gastroesophageal reflux disease)    Iron deficiency anemia 09/25/2021   Secondary hypercoagulable state (HCC) 07/26/2021   Persistent atrial fibrillation (HCC)    Genetic testing 12/26/2020   Malignant neoplasm of upper-outer quadrant of right breast in female, estrogen receptor positive (HCC) 11/28/2020    Orientation RESPIRATION BLADDER Height & Weight     Self, Situation, Place, Time  O2 (1L as needed) Continent Weight: 200 lb (90.7 kg) Height:  5' 2 (157.5 cm)  BEHAVIORAL SYMPTOMS/MOOD NEUROLOGICAL BOWEL NUTRITION STATUS      Continent    AMBULATORY STATUS COMMUNICATION OF NEEDS Skin   Extensive Assist Verbally Normal                       Personal Care Assistance Level of Assistance   Bathing, Feeding, Dressing Bathing Assistance: Maximum assistance Feeding assistance: Limited assistance Dressing Assistance: Maximum assistance     Functional Limitations Info  Sight, Hearing, Speech Sight Info: Impaired Hearing Info: Adequate Speech Info: Adequate    SPECIAL CARE FACTORS FREQUENCY  OT (By licensed OT), PT (By licensed PT)                    Contractures Contractures Info: Not present    Additional Factors Info  Code Status Code Status Info: FULL CODE             Current Medications (07/01/2024):  This is the current hospital active medication list Current Facility-Administered Medications  Medication Dose Route Frequency Provider Last Rate Last Admin   alum & mag hydroxide-simeth (MAALOX/MYLANTA) 200-200-20 MG/5ML suspension 15 mL  15 mL Oral Q4H PRN Lama, Gagan S, MD       apixaban  (ELIQUIS ) tablet 5 mg  5 mg Oral BID Kakrakandy, Arshad N, MD   5 mg at 07/01/24 0936   calcium  carbonate (TUMS - dosed in mg elemental calcium ) chewable tablet 200 mg of elemental calcium   500 mg Oral q AM Kakrakandy, Arshad N, MD   200 mg of elemental calcium  at 07/01/24 0622   cholecalciferol  (VITAMIN D3) 25 MCG (1000 UNIT) tablet 1,000 Units  1,000 Units Oral Daily Kakrakandy, Arshad N, MD   1,000 Units at 07/01/24 1009   furosemide  (LASIX ) tablet 40 mg  40  mg Oral Daily Kakrakandy, Arshad N, MD   40 mg at 07/01/24 0935   gabapentin  (NEURONTIN ) capsule 300 mg  300 mg Oral QHS Kakrakandy, Arshad N, MD   300 mg at 06/30/24 2148   insulin  aspart (novoLOG ) injection 0-9 Units  0-9 Units Subcutaneous TID WC Kakrakandy, Arshad N, MD   2 Units at 07/01/24 9062   iohexol  (OMNIPAQUE ) 350 MG/ML injection 75 mL  75 mL Intravenous Once PRN Prosperi, Christian H, PA-C       ketorolac  (TORADOL ) 15 MG/ML injection 15 mg  15 mg Intravenous Q6H Drusilla Sabas RAMAN, MD   15 mg at 07/01/24 9377   losartan  (COZAAR ) tablet 100 mg  100 mg Oral q AM Kakrakandy, Arshad N, MD   100 mg at 07/01/24  9376   methocarbamol  (ROBAXIN ) tablet 500 mg  500 mg Oral Q6H Lama, Gagan S, MD   500 mg at 07/01/24 0622   metoprolol  succinate (TOPROL -XL) 24 hr tablet 100 mg  100 mg Oral q AM Kakrakandy, Arshad N, MD   100 mg at 07/01/24 9376   oxyCODONE  (Oxy IR/ROXICODONE ) immediate release tablet 5 mg  5 mg Oral Q6H PRN Kakrakandy, Arshad N, MD   5 mg at 07/01/24 0935   pantoprazole  (PROTONIX ) EC tablet 40 mg  40 mg Oral BID Kakrakandy, Arshad N, MD   40 mg at 07/01/24 9063   potassium chloride  SA (KLOR-CON  M) CR tablet 20 mEq  20 mEq Oral Daily Kakrakandy, Arshad N, MD   20 mEq at 07/01/24 9062   simethicone  (MYLICON) chewable tablet 80 mg  80 mg Oral Q6H PRN Chavez, Abigail, NP   80 mg at 06/30/24 2224   venlafaxine  XR (EFFEXOR -XR) 24 hr capsule 112.5 mg  112.5 mg Oral QHS Kakrakandy, Arshad N, MD   112.5 mg at 06/30/24 2149     Discharge Medications: Please see discharge summary for a list of discharge medications.  Relevant Imaging Results:  Relevant Lab Results:   Additional Information SS# [id number]  [place]wenn Julien [place], [place]

## 2024-07-01 NOTE — Progress Notes (Signed)
 Physical Therapy Treatment Patient Details Name: DANIKAH BUDZIK MRN: [id number] DOB: 1951-07-15 Today's Date: 07/01/2024   History of Present Illness TYMEKA PRIVETTE is a 73 y.o. female who presented to [facility] ED 06/29/24 s/p fall at the doctor's office after she lost her balance. CT scan shows features concerning for multiple rib fractures on the left side (7-10) and also old rib fractures on the right side. PMHx: T2DM, persistent A-Fib, HTN, breast cancer s/p lumpectomy and radiation in 2022, sleep apnea, osteopenia, chronic anemia, balance issues, and chronic dyspnea.    PT Comments  Pt greeted supine in bed, pleasant and agreeable to PT session. Her pain continues to be poorly controlled. Pt agreeable to attempt bed mobility and sit EOB with +2 assist. She performed log roll technique with modA x2. Pt maintained seated balance for ~5 mins with unilateral UE support. She demonstrated a right lateral lean. Donned abdominal binder, which pt reported provided some comfort/relief. She will benefit from continued inpatient follow up therapy, <3 hours/day.    If plan is discharge home, recommend the following: Two people to help with walking and/or transfers;Two people to help with bathing/dressing/bathroom;Assist for transportation;Help with stairs or ramp for entrance;Assistance with cooking/housework   Can travel by private vehicle     No  Equipment Recommendations  [employer] lift;Wheelchair (measurements PT)    Recommendations for Other Services       Precautions / Restrictions Precautions Precautions: Fall Restrictions Weight Bearing Restrictions Per Provider Order: No     Mobility  Bed Mobility Overal bed mobility: Needs Assistance Bed Mobility: Rolling, Sidelying to Sit, Sit to Sidelying Rolling: Mod assist, +2 for physical assistance, +2 for safety/equipment Sidelying to sit: Mod assist, +2 for physical assistance, +2 for safety/equipment     Sit to sidelying: Mod assist, +2 for  physical assistance, +2 for safety/equipment General bed mobility comments: Pt sat up on L side of bed with increased time. Educated pt on log roll technique. Cues for sequencing. Assist and use of bed pad to pivot pt onto her hip. She brought BLE towards EOB. Assist to elevate trunk and bring LEs off EOB. Use of bed pad to scoot pt fwd til feet flat. Returning to bed pt required +2 assist, use of bed features, and bed pad to reposition.    Transfers Overall transfer level: Needs assistance                 General transfer comment: Pt attempted to stand from raise bed height with 2 HHA. She was unable to clear hips from bed and reported worsening pain, so declined further trials.    Ambulation/Gait                   Stairs             Wheelchair Mobility     Tilt Bed    Modified Rankin (Stroke Patients Only)       Balance Overall balance assessment: Needs assistance Sitting-balance support: Single extremity supported, Feet supported Sitting balance-Leahy Scale: Fair Sitting balance - Comments: Pt sat EOB with RUE support on bed and LUE held close to her chest. She demonstrated a right lateral lean. Pt required CGA at all times. She maintained upright posture for ~35mins. Postural control: Right lateral lean                                  Communication Communication Communication:  No apparent difficulties  Cognition Arousal: Alert Behavior During Therapy: WFL for tasks assessed/performed   PT - Cognitive impairments: No apparent impairments                         Following commands: Intact      Cueing Cueing Techniques: Verbal cues  Exercises      General Comments General comments (skin integrity, edema, etc.): Donned abominal binder in sitting.      Pertinent Vitals/Pain Pain Assessment Pain Assessment: 0-10 Pain Score: 9  Pain Location: Ribs with any slight movement Pain Descriptors / Indicators: Spasm,  Grimacing, Guarding, Moaning, Restless, Crying Pain Intervention(s): Limited activity within patient's tolerance, Monitored during session, Repositioned, Patient requesting pain meds-RN notified, Premedicated before session    Home Living                          Prior Function            PT Goals (current goals can now be found in the care plan section) Acute Rehab PT Goals Patient Stated Goal: Have my pain under control Progress towards PT goals: Progressing toward goals    Frequency    Min 2X/week      PT Plan      Co-evaluation              AM-PAC PT 6 Clicks Mobility   Outcome Measure  Help needed turning from your back to your side while in a flat bed without using bedrails?: Total Help needed moving from lying on your back to sitting on the side of a flat bed without using bedrails?: Total Help needed moving to and from a bed to a chair (including a wheelchair)?: Total Help needed standing up from a chair using your arms (e.g., wheelchair or bedside chair)?: Total Help needed to walk in hospital room?: Total Help needed climbing 3-5 steps with a railing? : Total 6 Click Score: 6    End of Session Equipment Utilized During Treatment: Oxygen  Activity Tolerance: Patient limited by pain Patient left: in bed;with call bell/phone within reach;with bed alarm set;with family/visitor present Nurse Communication: Mobility status;Patient requests pain meds PT Visit Diagnosis: Pain;Muscle weakness (generalized) (M62.81);Difficulty in walking, not elsewhere classified (R26.2);Unsteadiness on feet (R26.81);Other abnormalities of gait and mobility (R26.89) Pain - Right/Left: Left Pain - part of body:  (Flank)     Time: 8557-8494 PT Time Calculation (min) (ACUTE ONLY): 23 min  Charges:    $Therapeutic Activity: 23-37 mins PT General Charges $$ ACUTE PT VISIT: 1 Visit                     Randall SAUNDERS, PT, DPT Acute Rehabilitation Services Office:  [phone] Secure Chat Preferred  Delon CHRISTELLA Callander 07/01/2024, 4:41 PM

## 2024-07-02 DIAGNOSIS — S2242XA Multiple fractures of ribs, left side, initial encounter for closed fracture: Secondary | ICD-10-CM | POA: Diagnosis not present

## 2024-07-02 DIAGNOSIS — W19XXXA Unspecified fall, initial encounter: Secondary | ICD-10-CM | POA: Diagnosis not present

## 2024-07-02 LAB — BASIC METABOLIC PANEL WITH GFR
Anion gap: 11 (ref 5–15)
BUN: 20 mg/dL (ref 8–23)
CO2: 25 mmol/L (ref 22–32)
Calcium: 8.6 mg/dL — ABNORMAL LOW (ref 8.9–10.3)
Chloride: 103 mmol/L (ref 98–111)
Creatinine, Ser: 0.74 mg/dL (ref 0.44–1.00)
GFR, Estimated: >60 mL/min (ref >60)
Glucose, Bld: 163 mg/dL — ABNORMAL HIGH (ref 70–99)
Potassium: 4 mmol/L (ref 3.5–5.1)
Sodium: 139 mmol/L (ref 135–145)

## 2024-07-02 LAB — GLUCOSE, CAPILLARY
Glucose-Capillary: 162 mg/dL — ABNORMAL HIGH (ref 70–99)
Glucose-Capillary: 139 mg/dL — ABNORMAL HIGH (ref 70–99)
Glucose-Capillary: 140 mg/dL — ABNORMAL HIGH (ref 70–99)
Glucose-Capillary: 193 mg/dL — ABNORMAL HIGH (ref 70–99)

## 2024-07-02 MED ORDER — KETOROLAC TROMETHAMINE 15 MG/ML IJ SOLN
30.0000 mg | Freq: Once | INTRAMUSCULAR | Status: AC
  Administered 2024-07-02: 30 mg via INTRAVENOUS
  Filled 2024-07-02: qty 2

## 2024-07-02 MED ORDER — GABAPENTIN 300 MG PO CAPS
300.0000 mg | ORAL_CAPSULE | Freq: Two times a day (BID) | ORAL | Status: DC
  Administered 2024-07-02 – 2024-07-06 (×9): 300 mg via ORAL
  Filled 2024-07-02 (×9): qty 1

## 2024-07-02 MED ORDER — KETOROLAC TROMETHAMINE 15 MG/ML IJ SOLN
15.0000 mg | Freq: Four times a day (QID) | INTRAMUSCULAR | Status: AC
  Administered 2024-07-02 – 2024-07-03 (×5): 15 mg via INTRAVENOUS
  Filled 2024-07-02 (×5): qty 1

## 2024-07-02 NOTE — Plan of Care (Signed)

## 2024-07-02 NOTE — Progress Notes (Signed)
 [facility]  PROGRESS NOTE  Kayla Bennett FMW:[id number] DOB: 19-Apr-1951 DOA: 06/29/2024 PCP: Alvia Bring, DO   Brief HPI:   73 y.o. female with history of diabetes mellitus type 2, persistent atrial fibrillation, hypertension, history of breast cancer status postlumpectomy and radiation in 2022, sleep apnea, chronic anemia, balance issues, chronic dyspnea had a fall at the doctor's office when patient was trying to turn around.  Denies hitting her head or losing consciousness.  She fell onto her left side and hurt the left side of the chest.   ED Course: In the ER patient had CT scan done of her chest abdomen and pelvis which shows features concerning for multiple rib fractures on the left side.  There are also old rib fractures on the right side.  Trauma surgeon was consulted and requested admission to hospitalist since there was no displaced fractures or pleural effusion.  Labs show hemoglobin of 11.5 creatinine 0.9 blood glucose 155 EKG shows normal sinus rhythm.    Assessment/Plan:   Multiple rib fractures on the left side after mechanical fall.  Patient admitted for pain control.  Continue Robaxin , gabapentin , lidocaine  patch, oxycodone .  Will change Robaxin  to 500 mg scheduled 4 times daily, gabapentin  300 mg daily, oxycodone  5 mg every 6 hours as needed    incentive spirometer.  There are multiple old rib fractures on the right side.  PT/OT evaluation obtained.  Recommend to go to skilled nursing facility for rehab  Paroxysmal atrial fibrillation presently in sinus rhythm takes Eliquis  and metoprolol .  Hypertension on losartan   Chronic diastolic dysfunction on Lasix .  Last 2D echo done in April showed EF of 60 to 65% with grade 2 diastolic dysfunction.  Sleep apnea on BiPAP at bedtime.  Hyperlipidemia on Repatha .  Chronic anemia follow CBC.  Diabetes mellitus type 2 takes metformin .  Last hemoglobin A1c was 6.3.  On sliding scale coverage.  History of breast cancer  status postlumpectomy radiation therapy on tamoxifen .  Chronic pain on Effexor  and gabapentin .  Anemia appears to be chronic follow CBC.    Medications     apixaban   5 mg Oral BID   calcium  carbonate  500 mg Oral q AM   cholecalciferol   1,000 Units Oral Daily   furosemide   40 mg Oral Daily   gabapentin   300 mg Oral QHS   insulin  aspart  0-9 Units Subcutaneous TID WC   ketorolac   15 mg Intravenous Q6H   lidocaine   1 patch Transdermal Q24H   losartan   100 mg Oral q AM   methocarbamol   500 mg Oral Q6H   metoprolol  succinate  100 mg Oral q AM   pantoprazole   40 mg Oral BID   potassium chloride  SA  20 mEq Oral Daily   venlafaxine  XR  112.5 mg Oral QHS     Data Reviewed:   CBG:  Recent Labs  Lab 07/01/24 0857 07/01/24 1200 07/01/24 1601 07/01/24 2103 07/02/24 0837  GLUCAP 180* 153* 131* 145* 162*    SpO2: 100 % O2 Flow Rate (L/min): 1 L/min    Vitals:   07/01/24 1557 07/01/24 2058 07/02/24 0428 07/02/24 0601  BP: 123/60 118/78 134/65   Pulse: 66 65 61 64  Resp: 17     Temp: 98.8 F (37.1 C) 98 F (36.7 C) 97.9 F (36.6 C)   TempSrc:      SpO2: (!) 89% 94% 100%   Weight:      Height:  Data Reviewed:  Basic Metabolic Panel: Recent Labs  Lab 06/29/24 1546 06/30/24 0645 07/01/24 0714  NA 140 136 140  K 4.1 4.3 3.8  CL 102 101 101  CO2 27 24 28   GLUCOSE 155* 129* 150*  BUN 20 17 15   CREATININE 0.95 0.73 0.79  CALCIUM  9.3 8.9 8.9    CBC: Recent Labs  Lab 06/29/24 1546 06/30/24 0645  WBC 6.1 6.2  HGB 11.5* 11.5*  HCT 36.4 36.0  MCV 98.4 98.6  PLT 168 159    LFT No results for input(s): AST, ALT, ALKPHOS, BILITOT, PROT, ALBUMIN in the last 168 hours.   Antibiotics: Anti-infectives (From admission, onward)    None        DVT prophylaxis: Apixaban   Code Status: Full code  Family Communication: No family at bedside   CONSULTS    Subjective   Still complains of pain in left chest wall.  Objective     Physical Examination:  General-appears in no acute distress Heart-S1-S2, regular, no murmur auscultated Lungs-clear to auscultation bilaterally, no wheezing or crackles auscultated Chest wall-left anterior chest wall tenderness to palpation Abdomen-soft, nontender, no organomegaly Extremities-no edema in the lower extremities Neuro-alert, oriented x3, no focal deficit noted   Status is: Inpatient:             Kayla Bennett   [facility] If 7PM-7AM, please contact night-coverage at [email or web address], Office  [phone]   07/02/2024, 8:40 AM  LOS: 1 day

## 2024-07-02 NOTE — Plan of Care (Signed)
 ?  Problem: Clinical Measurements: ?Goal: Ability to maintain clinical measurements within normal limits will improve ?Outcome: Progressing ?Goal: Will remain free from infection ?Outcome: Progressing ?Goal: Diagnostic test results will improve ?Outcome: Progressing ?  ?

## 2024-07-03 DIAGNOSIS — I5032 Chronic diastolic (congestive) heart failure: Secondary | ICD-10-CM | POA: Diagnosis not present

## 2024-07-03 DIAGNOSIS — E1169 Type 2 diabetes mellitus with other specified complication: Secondary | ICD-10-CM | POA: Diagnosis not present

## 2024-07-03 DIAGNOSIS — S2242XA Multiple fractures of ribs, left side, initial encounter for closed fracture: Secondary | ICD-10-CM | POA: Diagnosis not present

## 2024-07-03 DIAGNOSIS — W19XXXA Unspecified fall, initial encounter: Secondary | ICD-10-CM | POA: Diagnosis not present

## 2024-07-03 LAB — GLUCOSE, CAPILLARY
Glucose-Capillary: 148 mg/dL — ABNORMAL HIGH (ref 70–99)
Glucose-Capillary: 133 mg/dL — ABNORMAL HIGH (ref 70–99)
Glucose-Capillary: 130 mg/dL — ABNORMAL HIGH (ref 70–99)
Glucose-Capillary: 147 mg/dL — ABNORMAL HIGH (ref 70–99)

## 2024-07-03 MED ORDER — TAMOXIFEN CITRATE 10 MG PO TABS
20.0000 mg | ORAL_TABLET | Freq: Every morning | ORAL | Status: DC
  Administered 2024-07-03 – 2024-07-07 (×5): 20 mg via ORAL
  Filled 2024-07-03 (×5): qty 2

## 2024-07-03 NOTE — Plan of Care (Signed)
  Problem: Education: Goal: Knowledge of General Education information will improve Description: Including pain rating scale, medication(s)/side effects and non-pharmacologic comfort measures Outcome: Progressing   Problem: Elimination: Goal: Will not experience complications related to urinary retention Outcome: Progressing   Problem: Pain Managment: Goal: General experience of comfort will improve and/or be controlled Outcome: Progressing   Problem: Skin Integrity: Goal: Risk for impaired skin integrity will decrease Outcome: Progressing

## 2024-07-03 NOTE — Progress Notes (Signed)
 [facility]  PROGRESS NOTE  DESSA LEDEE FMW:[id number] DOB: [birth year] DOA: 06/29/2024 PCP: Alvia Bring, DO   Brief HPI:   73 y.o. female with history of diabetes mellitus type 2, persistent atrial fibrillation, hypertension, history of breast cancer status postlumpectomy and radiation in 2022, sleep apnea, chronic anemia, balance issues, chronic dyspnea had a fall at the doctor's office when patient was trying to turn around.  Denies hitting her head or losing consciousness.  She fell onto her left side and hurt the left side of the chest.   ED Course: In the ER patient had CT scan done of her chest abdomen and pelvis which shows features concerning for multiple rib fractures on the left side.  There are also old rib fractures on the right side.  Trauma surgeon was consulted and requested admission to hospitalist since there was no displaced fractures or pleural effusion.  Labs show hemoglobin of 11.5 creatinine 0.9 blood glucose 155 EKG shows normal sinus rhythm.    Assessment/Plan:   Multiple rib fractures on the left side after mechanical fall.  Patient admitted for pain control.  Continue Robaxin , gabapentin , lidocaine  patch, oxycodone .  Will change Robaxin  to 500 mg scheduled 4 times daily, gabapentin  300 mg daily, oxycodone  10 mg every 6 hours as needed    incentive spirometer.  There are multiple old rib fractures on the right side.  PT/OT evaluation obtained.  Recommend to go to skilled nursing facility for rehab  Paroxysmal atrial fibrillation presently in sinus rhythm takes Eliquis  and metoprolol .  Hypertension on losartan   Chronic diastolic dysfunction on Lasix .  Last 2D echo done in April showed EF of 60 to 65% with grade 2 diastolic dysfunction.  Sleep apnea on BiPAP at bedtime.  Hyperlipidemia on Repatha .  Chronic anemia hemoglobin is stable.  Follow CBC in a.m.  Diabetes mellitus type 2 takes metformin  at home.  Last hemoglobin A1c was 6.3.  On sliding scale  coverage.  CBG well-controlled  History of breast cancer status postlumpectomy radiation therapy on tamoxifen .  Chronic pain on Effexor  and gabapentin .  Anemia appears to be chronic follow CBC.    Medications     apixaban   5 mg Oral BID   calcium  carbonate  500 mg Oral q AM   cholecalciferol   1,000 Units Oral Daily   furosemide   40 mg Oral Daily   gabapentin   300 mg Oral BID   insulin  aspart  0-9 Units Subcutaneous TID WC   ketorolac   15 mg Intravenous Q6H   lidocaine   1 patch Transdermal Q24H   losartan   100 mg Oral q AM   methocarbamol   500 mg Oral Q6H   metoprolol  succinate  100 mg Oral q AM   pantoprazole   40 mg Oral BID   potassium chloride  SA  20 mEq Oral Daily   tamoxifen   20 mg Oral q morning   venlafaxine  XR  112.5 mg Oral QHS     Data Reviewed:   CBG:  Recent Labs  Lab 07/02/24 0837 07/02/24 1219 07/02/24 1736 07/02/24 2111 07/03/24 0739  GLUCAP 162* 139* 140* 193* 148*    SpO2: 92 % O2 Flow Rate (L/min): 1 L/min    Vitals:   07/02/24 1734 07/02/24 2108 07/03/24 0500 07/03/24 0742  BP: 126/62 121/69 122/71 120/83  Pulse: (!) 57 62 80 (!) 58  Resp: 17 18 18 18   Temp: 97.8 F (36.6 C) 98.1 F (36.7 C) 98.3 F (36.8 C) 98.2 F (36.8 C)  TempSrc:  Oral Oral  SpO2: 97% 91% 93% 92%  Weight:      Height:          Data Reviewed:  Basic Metabolic Panel: Recent Labs  Lab 06/29/24 1546 06/30/24 0645 07/01/24 0714 07/02/24 1104  NA 140 136 140 139  K 4.1 4.3 3.8 4.0  CL 102 101 101 103  CO2 27 24 28 25   GLUCOSE 155* 129* 150* 163*  BUN 20 17 15 20   CREATININE 0.95 0.73 0.79 0.74  CALCIUM  9.3 8.9 8.9 8.6*    CBC: Recent Labs  Lab 06/29/24 1546 06/30/24 0645  WBC 6.1 6.2  HGB 11.5* 11.5*  HCT 36.4 36.0  MCV 98.4 98.6  PLT 168 159    LFT No results for input(s): AST, ALT, ALKPHOS, BILITOT, PROT, ALBUMIN in the last 168 hours.   Antibiotics: Anti-infectives (From admission, onward)    None        DVT  prophylaxis: Apixaban   Code Status: Full code  Family Communication: No family at bedside   CONSULTS    Subjective   Continues to complain of left chest wall pain pulmonary.  Objective    Physical Examination:  Appears in no acute distress Chest wall tenderness to palpation in the left lower anterior region Abdomen is soft, nontender, no organomegaly Extremities no edema   Status is: Inpatient:             [name]   [facility] If 7PM-7AM, please contact night-coverage at [email or web address], Office  [phone]   07/03/2024, 8:12 AM  LOS: 2 days

## 2024-07-04 DIAGNOSIS — W19XXXA Unspecified fall, initial encounter: Secondary | ICD-10-CM | POA: Diagnosis not present

## 2024-07-04 DIAGNOSIS — S2242XA Multiple fractures of ribs, left side, initial encounter for closed fracture: Secondary | ICD-10-CM | POA: Diagnosis not present

## 2024-07-04 DIAGNOSIS — E1169 Type 2 diabetes mellitus with other specified complication: Secondary | ICD-10-CM | POA: Diagnosis not present

## 2024-07-04 DIAGNOSIS — I5032 Chronic diastolic (congestive) heart failure: Secondary | ICD-10-CM | POA: Diagnosis not present

## 2024-07-04 LAB — GLUCOSE, CAPILLARY
Glucose-Capillary: 156 mg/dL — ABNORMAL HIGH (ref 70–99)
Glucose-Capillary: 167 mg/dL — ABNORMAL HIGH (ref 70–99)
Glucose-Capillary: 164 mg/dL — ABNORMAL HIGH (ref 70–99)
Glucose-Capillary: 150 mg/dL — ABNORMAL HIGH (ref 70–99)

## 2024-07-04 LAB — BASIC METABOLIC PANEL WITH GFR
Anion gap: 8 (ref 5–15)
BUN: 19 mg/dL (ref 8–23)
CO2: 29 mmol/L (ref 22–32)
Calcium: 8.8 mg/dL — ABNORMAL LOW (ref 8.9–10.3)
Chloride: 104 mmol/L (ref 98–111)
Creatinine, Ser: 0.86 mg/dL (ref 0.44–1.00)
GFR, Estimated: >60 mL/min (ref >60)
Glucose, Bld: 150 mg/dL — ABNORMAL HIGH (ref 70–99)
Potassium: 4.3 mmol/L (ref 3.5–5.1)
Sodium: 141 mmol/L (ref 135–145)

## 2024-07-04 LAB — CBC
HCT: 34 % — ABNORMAL LOW (ref 36.0–46.0)
Hemoglobin: 11.1 g/dL — ABNORMAL LOW (ref 12.0–15.0)
MCH: 31.5 pg (ref 26.0–34.0)
MCHC: 32.6 g/dL (ref 30.0–36.0)
MCV: 96.6 fL (ref 80.0–100.0)
Platelets: 168 K/uL (ref 150–400)
RBC: 3.52 MIL/uL — ABNORMAL LOW (ref 3.87–5.11)
RDW: 12.9 % (ref 11.5–15.5)
WBC: 6 K/uL (ref 4.0–10.5)
nRBC: 0 % (ref 0.0–0.2)

## 2024-07-04 MED ORDER — METHOCARBAMOL 750 MG PO TABS
750.0000 mg | ORAL_TABLET | Freq: Four times a day (QID) | ORAL | Status: DC
  Administered 2024-07-04 – 2024-07-06 (×8): 750 mg via ORAL
  Filled 2024-07-04 (×8): qty 1

## 2024-07-04 MED ORDER — DICLOFENAC SODIUM 1 % EX GEL
2.0000 g | Freq: Four times a day (QID) | CUTANEOUS | Status: DC
  Administered 2024-07-04 – 2024-07-07 (×13): 2 g via TOPICAL
  Filled 2024-07-04: qty 100

## 2024-07-04 NOTE — Progress Notes (Signed)
 Physical Therapy Treatment Patient Details Name: Kayla Bennett MRN: [id number] DOB: Dec 22, 1950 Today's Date: 07/04/2024   History of Present Illness Kayla Bennett is a 73 y.o. female who presented to [facility] ED 06/29/24 s/p fall at the doctor's office after she lost her balance. CT scan shows features concerning for multiple rib fractures on the left side (7-10) and also old rib fractures on the right side. PMHx: T2DM, persistent A-Fib, HTN, breast cancer s/p lumpectomy and radiation in 2022, sleep apnea, osteopenia, chronic anemia, balance issues, and chronic dyspnea.    PT Comments  Pt progressing slowly towards mobility goals, continues to have pain and spasming L sided mid back with all mvmt and sometimes just with breathing. Pt able to roll to R and come to sitting with mod A +2 for assist at LE's and trunk.  Pt unable to stand independently but was able to come to standing with min A +2 and RW with gait belt holding pillow against L sided midback for pain control. Pt had difficulty straightening knees all the way and stepping feet but was able to pivot to recliner with increased time and +2 assist. Recommend stedy for back to bed. Patient will benefit from continued inpatient follow up therapy, <3 hours/day. PT will continue to follow.    If plan is discharge home, recommend the following: Two people to help with walking and/or transfers;Two people to help with bathing/dressing/bathroom;Assist for transportation;Help with stairs or ramp for entrance;Assistance with cooking/housework   Can travel by private vehicle     No  Equipment Recommendations  Wheelchair (measurements PT);Rolling walker (2 wheels)    Recommendations for Other Services OT consult     Precautions / Restrictions Precautions Precautions: Fall Recall of Precautions/Restrictions: Intact Precaution/Restrictions Comments: abdominal binder ordered but not in room Restrictions Weight Bearing Restrictions Per Provider  Order: No     Mobility  Bed Mobility Overal bed mobility: Needs Assistance Bed Mobility: Sidelying to Sit, Rolling Rolling: Min assist Sidelying to sit: Mod assist, +2 for physical assistance, +2 for safety/equipment       General bed mobility comments: rolled to the R with better tolerance, required +2 mod A for LE's off EOB and elevation of trunk into sitting. Increased pain once fully upright but subsided with sitting    Transfers Overall transfer level: Needs assistance Equipment used: Rolling walker (2 wheels) Transfers: Bed to chair/wheelchair/BSC, Sit to/from Stand Sit to Stand: Min assist, +2 physical assistance, +2 safety/equipment   Step pivot transfers: Min assist, +2 physical assistance, +2 safety/equipment       General transfer comment: sigificantly increased time and maximal cues needed. pt felt some pain relief with a pillow splinted on her back (used a gaitbelt for compression). Needed min A +2 for power up and had difficulty straightening B knees. Great difficulty stepping feet bed to chair    Ambulation/Gait               General Gait Details: unable to advance ambulation due to pain   Stairs             Wheelchair Mobility     Tilt Bed    Modified Rankin (Stroke Patients Only)       Balance Overall balance assessment: Needs assistance Sitting-balance support: Feet supported Sitting balance-Leahy Scale: Fair Sitting balance - Comments: Pt sat EOB with RUE support on bed and LUE held close to her chest. She demonstrated a right lateral lean. Pt required CGA at all times. She maintained  upright posture for ~65mins. Postural control: Right lateral lean Standing balance support: Bilateral upper extremity supported, During functional activity Standing balance-Leahy Scale: Poor Standing balance comment: heavily reliant on RW                            Communication Communication Communication: No apparent difficulties   Cognition Arousal: Alert Behavior During Therapy: WFL for tasks assessed/performed   PT - Cognitive impairments: No apparent impairments                       PT - Cognition Comments: Pt A,Ox4, anxious Following commands: Intact      Cueing Cueing Techniques: Verbal cues  Exercises Other Exercises Other Exercises: encouraged mindful breathing throughout mobility as pt tends to hold breath    General Comments General comments (skin integrity, edema, etc.): VSS. Began to feel nauseous after sitting and standing >10 mins total.      Pertinent Vitals/Pain Pain Assessment Pain Assessment: Faces Faces Pain Scale: Hurts whole lot Pain Location: posterior L ribs Pain Descriptors / Indicators: Spasm, Grimacing, Guarding, Moaning, Restless Pain Intervention(s): Limited activity within patient's tolerance, Monitored during session    Home Living                          Prior Function            PT Goals (current goals can now be found in the care plan section) Acute Rehab PT Goals Patient Stated Goal: Have my pain under control PT Goal Formulation: With patient Time For Goal Achievement: 07/14/24 Potential to Achieve Goals: Good Progress towards PT goals: Progressing toward goals    Frequency    Min 2X/week      PT Plan      Co-evaluation PT/OT/SLP Co-Evaluation/Treatment: Yes Reason for Co-Treatment: Complexity of the patient's impairments (multi-system involvement);For patient/therapist safety;To address functional/ADL transfers PT goals addressed during session: Mobility/safety with mobility;Balance;Proper use of DME        AM-PAC PT 6 Clicks Mobility   Outcome Measure  Help needed turning from your back to your side while in a flat bed without using bedrails?: A Lot Help needed moving from lying on your back to sitting on the side of a flat bed without using bedrails?: A Lot Help needed moving to and from a bed to a chair (including a  wheelchair)?: Total Help needed standing up from a chair using your arms (e.g., wheelchair or bedside chair)?: Total Help needed to walk in hospital room?: Total Help needed climbing 3-5 steps with a railing? : Total 6 Click Score: 8    End of Session Equipment Utilized During Treatment: Gait belt Activity Tolerance: Patient limited by pain Patient left: with call bell/phone within reach;in chair;with chair alarm set Nurse Communication: Mobility status;Patient requests pain meds PT Visit Diagnosis: Pain;Muscle weakness (generalized) (M62.81);Difficulty in walking, not elsewhere classified (R26.2);Unsteadiness on feet (R26.81);Other abnormalities of gait and mobility (R26.89) Pain - Right/Left: Left Pain - part of body:  (mid thoracic region)     Time: 8972-8883 PT Time Calculation (min) (ACUTE ONLY): 49 min  Charges:    $Therapeutic Activity: 8-22 mins PT General Charges $$ ACUTE PT VISIT: 1 Visit                     Richerd Lipoma, PT  Acute Rehab Services Secure chat preferred Office [phone]    [place]  L Shayra Anton 07/04/2024, 1:59 PM

## 2024-07-04 NOTE — Progress Notes (Signed)
 [facility]  PROGRESS NOTE  Kayla Bennett FMW:[id number] DOB: 11/19/1950 DOA: 06/29/2024 PCP: Alvia Bring, DO   Brief HPI:   73 y.o. female with history of diabetes mellitus type 2, persistent atrial fibrillation, hypertension, history of breast cancer status postlumpectomy and radiation in 2022, sleep apnea, chronic anemia, balance issues, chronic dyspnea had a fall at the doctor's office when patient was trying to turn around.  Denies hitting her head or losing consciousness.  She fell onto her left side and hurt the left side of the chest.   ED Course: In the ER patient had CT scan done of her chest abdomen and pelvis which shows features concerning for multiple rib fractures on the left side.  There are also old rib fractures on the right side.  Trauma surgeon was consulted and requested admission to hospitalist since there was no displaced fractures or pleural effusion.  Labs show hemoglobin of 11.5 creatinine 0.9 blood glucose 155 EKG shows normal sinus rhythm.    Assessment/Plan:   Multiple rib fractures on the left side after mechanical fall.  Patient admitted for pain control.   Continue Robaxin , gabapentin , lidocaine  patch, oxycodone .   No improvement with Robaxin , will discontinue Robaxin  and start tizanidine 2 mg p.o. 3 times daily.   Continue gabapentin  300 mg daily, oxycodone  10 mg every 6 hours as needed    incentive spirometer.  There are multiple old rib fractures on the right side.  PT/OT evaluation obtained.  Recommend to go to skilled nursing facility for rehab  Paroxysmal atrial fibrillation presently in sinus rhythm, takes Eliquis  and metoprolol .  Hypertension on losartan   Chronic diastolic dysfunction on Lasix .  Last 2D echo done in April showed EF of 60 to 65% with grade 2 diastolic dysfunction.  Sleep apnea on BiPAP at bedtime.  Hyperlipidemia on Repatha .  Chronic anemia hemoglobin is stable.  Follow CBC in a.m.  Diabetes mellitus type 2 takes  metformin  at home.  Last hemoglobin A1c was 6.3.  On sliding scale coverage.  CBG well-controlled  History of breast cancer status postlumpectomy radiation therapy on tamoxifen .  Chronic pain on Effexor  and gabapentin .  Anemia appears to be chronic follow CBC.    Medications     apixaban   5 mg Oral BID   calcium  carbonate  500 mg Oral q AM   cholecalciferol   1,000 Units Oral Daily   furosemide   40 mg Oral Daily   gabapentin   300 mg Oral BID   insulin  aspart  0-9 Units Subcutaneous TID WC   lidocaine   1 patch Transdermal Q24H   losartan   100 mg Oral q AM   methocarbamol   500 mg Oral Q6H   metoprolol  succinate  100 mg Oral q AM   pantoprazole   40 mg Oral BID   potassium chloride  SA  20 mEq Oral Daily   tamoxifen   20 mg Oral q morning   venlafaxine  XR  112.5 mg Oral QHS     Data Reviewed:   CBG:  Recent Labs  Lab 07/03/24 0739 07/03/24 1219 07/03/24 1813 07/03/24 2120 07/04/24 0813  GLUCAP 148* 133* 130* 147* 156*    SpO2: 95 % O2 Flow Rate (L/min): 1 L/min    Vitals:   07/03/24 1710 07/03/24 2016 07/04/24 0500 07/04/24 0824  BP: (!) 132/52 135/65 136/65 125/69  Pulse: 62 67 64   Resp: 20 20 18    Temp: 98.4 F (36.9 C) 98.5 F (36.9 C) 98.1 F (36.7 C)   TempSrc: Oral Oral Oral  SpO2: 91% 91% 95%   Weight:      Height:          Data Reviewed:  Basic Metabolic Panel: Recent Labs  Lab 06/29/24 1546 06/30/24 0645 07/01/24 0714 07/02/24 1104 07/04/24 0451  NA 140 136 140 139 141  K 4.1 4.3 3.8 4.0 4.3  CL 102 101 101 103 104  CO2 27 24 28 25 29   GLUCOSE 155* 129* 150* 163* 150*  BUN 20 17 15 20 19   CREATININE 0.95 0.73 0.79 0.74 0.86  CALCIUM  9.3 8.9 8.9 8.6* 8.8*    CBC: Recent Labs  Lab 06/29/24 1546 06/30/24 0645 07/04/24 0451  WBC 6.1 6.2 6.0  HGB 11.5* 11.5* 11.1*  HCT 36.4 36.0 34.0*  MCV 98.4 98.6 96.6  PLT 168 159 168    LFT No results for input(s): AST, ALT, ALKPHOS, BILITOT, PROT, ALBUMIN in the last 168  hours.   Antibiotics: Anti-infectives (From admission, onward)    None        DVT prophylaxis: Apixaban   Code Status: Full code  Family Communication: No family at bedside   CONSULTS    Subjective   Patient continues to complain of muscle spasm and pain in left chest wall  Objective    Physical Examination:  Appears in no acute distress S1-S2, regular Lungs clear to auscultation bilaterally Abdomen is soft, nontender, no organomegaly Extremities no edema  Status is: Inpatient:             [name]   [facility] If 7PM-7AM, please contact night-coverage at [email or web address], Office  [phone]   07/04/2024, 8:46 AM  LOS: 3 days

## 2024-07-04 NOTE — Plan of Care (Signed)
  Problem: Education: Goal: Knowledge of General Education information will improve Description: Including pain rating scale, medication(s)/side effects and non-pharmacologic comfort measures Outcome: Progressing   Problem: Pain Managment: Goal: General experience of comfort will improve and/or be controlled Outcome: Progressing   Problem: Skin Integrity: Goal: Risk for impaired skin integrity will decrease Outcome: Progressing   Problem: Nutritional: Goal: Maintenance of adequate nutrition will improve Outcome: Progressing

## 2024-07-04 NOTE — Care Management Important Message (Signed)
 Important Message  Patient Details  Name: Kayla Bennett MRN: [id number] Date of Birth: 03/24/1951   Important Message Given:  Yes - Medicare IM     Jon Cruel 07/04/2024, 1:39 PM

## 2024-07-04 NOTE — TOC Progression Note (Signed)
 Transition of Care [facility]) - Progression Note    Patient Details  Name: Kayla Bennett MRN: [id number] Date of Birth: [birth year]  Transition of Care [facility]) CM/SW Contact  Capone Schwinn LITTIE Moose, [place] Phone Number: 07/04/2024, 4:02 PM  Clinical Narrative:    CSW provided pt with SNF bed offers. CSW will follow up on facility choice.                      Expected Discharge Plan and Services                                               Social Drivers of Health (SDOH) Interventions SDOH Screenings   Food Insecurity: No Food Insecurity (06/29/2024)  Housing: Low Risk  (06/29/2024)  Transportation Needs: No Transportation Needs (06/29/2024)  Utilities: Not At Risk (06/29/2024)  Alcohol Screen: Low Risk  (12/01/2023)  Depression (PHQ2-9): Low Risk  (04/07/2024)  Financial Resource Strain: Low Risk  (04/06/2024)  Physical Activity: Inactive (04/06/2024)  Social Connections: Moderately Isolated (06/29/2024)  Stress: No Stress Concern Present (04/06/2024)  Tobacco Use: Low Risk  (06/29/2024)  Health Literacy: Adequate Health Literacy (12/01/2023)    Readmission Risk Interventions    10/17/2021   11:29 AM  Readmission Risk Prevention Plan  Transportation Screening Complete  PCP or Specialist Appt within 5-7 Days Complete  Home Care Screening Complete  Medication Review (RN CM) Complete

## 2024-07-04 NOTE — Progress Notes (Signed)
   07/04/24 2356  BiPAP/CPAP/SIPAP  $ Non-Invasive Home Ventilator  Subsequent  BiPAP/CPAP/SIPAP Pt Type Adult  BiPAP/CPAP/SIPAP Resmed  Mask Type Full face mask  Mask Size Small  IPAP 10 cmH20  EPAP 5 cmH2O  FiO2 (%) 28 %  Flow Rate 2 lpm  Patient Home Machine No  Patient Home Mask No  Patient Home Tubing No  Auto Titrate No  CPAP/SIPAP surface wiped down Yes  Device Plugged into RED Power Outlet Yes  BiPAP/CPAP /SiPAP Vitals  Pulse Rate 72  Resp 20  SpO2 96 %  Bilateral Breath Sounds Diminished  MEWS Score/Color  MEWS Score 0  MEWS Score Color Green

## 2024-07-04 NOTE — Progress Notes (Signed)
 Occupational Therapy Treatment Patient Details Name: ROREY HODGES MRN: [id number] DOB: 1951-08-29 Today's Date: 07/04/2024   History of present illness EMALIA WITKOP is a 73 y.o. female who presented to [facility] ED 06/29/24 s/p fall at the doctor's office after she lost her balance. CT scan shows features concerning for multiple rib fractures on the left side (7-10) and also old rib fractures on the right side. PMHx: T2DM, persistent A-Fib, HTN, breast cancer s/p lumpectomy and radiation in 2022, sleep apnea, osteopenia, chronic anemia, balance issues, and chronic dyspnea.   OT comments  Pt is making steady progress towards their acute OT goals. She continues to be limited by L flank/back pain however tolerated more movement and transfers this date. Overall she needed mod A +2 fro bed mobility and min A +2 for simple transfer. Significantly increased time and cues required for body mechanics and pain management. Pt found some relief with a pillow splinted on her back/L side with the compression of a giat belt (abd binder ordered but not in room). OT to continue to follow acutely to facilitate progress towards established goals. Pt will continue to benefit from skilled inpatient follow up therapy, <3 hours/day.  Recommend nursing staff use the stedy for OOB transfers.        If plan is discharge home, recommend the following:  A lot of help with walking and/or transfers;A lot of help with bathing/dressing/bathroom;Assistance with cooking/housework;Help with stairs or ramp for entrance;Assist for transportation   Equipment Recommendations  Other (comment)       Precautions / Restrictions Precautions Precautions: Fall Recall of Precautions/Restrictions: Intact Precaution/Restrictions Comments: abdominal binder ordered but not in room Restrictions Weight Bearing Restrictions Per Provider Order: No       Mobility Bed Mobility Overal bed mobility: Needs Assistance Bed Mobility: Sidelying  to Sit, Rolling Rolling: Min assist Sidelying to sit: Mod assist, +2 for physical assistance, +2 for safety/equipment       General bed mobility comments: rolled to the R with better tolerance, required increased assist to obaining sitting    Transfers Overall transfer level: Needs assistance Equipment used: Rolling walker (2 wheels) Transfers: Bed to chair/wheelchair/BSC, Sit to/from Stand Sit to Stand: Min assist, +2 physical assistance, +2 safety/equipment     Step pivot transfers: Min assist, +2 physical assistance, +2 safety/equipment     General transfer comment: singificantly increased time and maximal cues needed. pt felt some pain relief with a pillow splinted on her back (used a gaitbelt for compression)     Balance Overall balance assessment: Needs assistance Sitting-balance support: Feet supported Sitting balance-Leahy Scale: Fair     Standing balance support: Bilateral upper extremity supported, During functional activity Standing balance-Leahy Scale: Poor                             ADL either performed or assessed with clinical judgement   ADL Overall ADL's : Needs assistance/impaired Eating/Feeding: Set up;Sitting               Upper Body Dressing : Maximal assistance;Sitting   Lower Body Dressing: Total assistance;Sit to/from stand   Toilet Transfer: Minimal assistance;+2 for physical assistance;+2 for safety/equipment;Stand-pivot;Rolling walker (2 wheels) Toilet Transfer Details (indicate cue type and reason): simulated bed>chair this date. Pt required significantly increased time and cues for body mechanics, but only needed min A +2 for physical assistance         Functional mobility during ADLs: Minimal assistance;+2  for physical assistance;+2 for safety/equipment General ADL Comments: still limited by rib pain    Extremity/Trunk Assessment Upper Extremity Assessment Upper Extremity Assessment: LUE deficits/detail LUE Deficits  / Details: Decreased AROM at shoulder due to prior fall - gurading thorughout. Encouraged movement of all joints to avoid stiffness LUE Coordination: decreased gross motor   Lower Extremity Assessment Lower Extremity Assessment: Defer to PT evaluation        Vision   Vision Assessment?: No apparent visual deficits   Perception Perception Perception: Within Functional Limits   Praxis Praxis Praxis: WFL   Communication Communication Communication: No apparent difficulties   Cognition Arousal: Alert Behavior During Therapy: WFL for tasks assessed/performed Cognition: No apparent impairments                               Following commands: Intact        Cueing   Cueing Techniques: Verbal cues  Exercises      Shoulder Instructions       General Comments VSS, pt did feel nauseous and hot after standing for 3 minutes. BP not assessed    Pertinent Vitals/ Pain       Pain Assessment Pain Assessment: Faces Faces Pain Scale: Hurts whole lot Pain Location: posterior ribs Pain Descriptors / Indicators: Spasm, Grimacing, Guarding, Moaning, Restless, Crying Pain Intervention(s): Limited activity within patient's tolerance, Monitored during session  Home Living                                          Prior Functioning/Environment              Frequency  Min 2X/week        Progress Toward Goals  OT Goals(current goals can now be found in the care plan section)  Progress towards OT goals: Progressing toward goals  Acute Rehab OT Goals Patient Stated Goal: less pain OT Goal Formulation: With patient Time For Goal Achievement: 07/15/24 Potential to Achieve Goals: Good ADL Goals Pt Will Perform Grooming: Independently;standing Pt Will Perform Upper Body Bathing: Independently;sitting Pt Will Perform Lower Body Bathing: with modified independence;sit to/from stand;with adaptive equipment Pt Will Perform Upper Body Dressing:  sitting;with modified independence Pt Will Perform Lower Body Dressing: with modified independence;with adaptive equipment;sit to/from stand Pt Will Transfer to Toilet: with modified independence;ambulating;bedside commode Pt Will Perform Toileting - Clothing Manipulation and hygiene: with modified independence;sit to/from stand Additional ADL Goal #1: Pt will be Mod I in and OOB for basic ADLs  Plan      Co-evaluation                 AM-PAC OT 6 Clicks Daily Activity     Outcome Measure   Help from another person eating meals?: None Help from another person taking care of personal grooming?: A Little Help from another person toileting, which includes using toliet, bedpan, or urinal?: Total Help from another person bathing (including washing, rinsing, drying)?: A Lot Help from another person to put on and taking off regular upper body clothing?: A Lot Help from another person to put on and taking off regular lower body clothing?: Total 6 Click Score: 13    End of Session Equipment Utilized During Treatment: Gait belt;Rolling walker (2 wheels)  OT Visit Diagnosis: Other abnormalities of gait and mobility (R26.89);Muscle weakness (generalized) (M62.81);Pain Pain -  Right/Left: Left   Activity Tolerance Patient limited by pain   Patient Left in chair;with chair alarm set;with call bell/phone within reach   Nurse Communication Mobility status        Time: 8972-8883 OT Time Calculation (min): 49 min  Charges: OT General Charges $OT Visit: 1 Visit OT Treatments $Self Care/Home Management : 23-37 mins  Lucie Kendall, OTR/L Acute Rehabilitation Services Office [phone] Secure Chat Communication Preferred   Lucie JONETTA Kendall 07/04/2024, 11:32 AM

## 2024-07-04 NOTE — Progress Notes (Signed)
   07/04/24 0028  BiPAP/CPAP/SIPAP  $ Non-Invasive Home Ventilator  Subsequent  BiPAP/CPAP/SIPAP Pt Type Adult  BiPAP/CPAP/SIPAP Resmed  Mask Type Full face mask  Mask Size Small  Respiratory Rate 18 breaths/min  IPAP 10 cmH20  EPAP 5 cmH2O  Flow Rate 2 lpm  Patient Home Machine No  Patient Home Mask No  Patient Home Tubing No  Auto Titrate No  Nasal massage performed Yes

## 2024-07-05 DIAGNOSIS — W19XXXA Unspecified fall, initial encounter: Secondary | ICD-10-CM | POA: Diagnosis not present

## 2024-07-05 DIAGNOSIS — S2242XA Multiple fractures of ribs, left side, initial encounter for closed fracture: Secondary | ICD-10-CM | POA: Diagnosis not present

## 2024-07-05 DIAGNOSIS — I5032 Chronic diastolic (congestive) heart failure: Secondary | ICD-10-CM | POA: Diagnosis not present

## 2024-07-05 DIAGNOSIS — E1169 Type 2 diabetes mellitus with other specified complication: Secondary | ICD-10-CM | POA: Diagnosis not present

## 2024-07-05 LAB — GLUCOSE, CAPILLARY
Glucose-Capillary: 149 mg/dL — ABNORMAL HIGH (ref 70–99)
Glucose-Capillary: 154 mg/dL — ABNORMAL HIGH (ref 70–99)
Glucose-Capillary: 156 mg/dL — ABNORMAL HIGH (ref 70–99)
Glucose-Capillary: 222 mg/dL — ABNORMAL HIGH (ref 70–99)

## 2024-07-05 NOTE — Plan of Care (Signed)

## 2024-07-05 NOTE — TOC Progression Note (Signed)
 Transition of Care [facility]) - Progression Note    Patient Details  Name: Kayla Bennett MRN: [id number] Date of Birth: 07-Nov-1951  [name]) CM/SW Contact  Meagan Spease LITTIE [place], [place] Phone Number: 07/05/2024, 1:39 PM  Clinical Narrative:    CSW spoke with pt about SNF choice. Pt stated she had a few more facilities she wanted CSW to see if she could go to [facility], [facility] [facility], [facility], and [facility]). CSW sent referral to [facility] in the hub, and secure emailed a referral to [facility]. CSW called [facility] admission and left a voicemail for the admissions coordinator. CSW will update pt and continue to follow.                     Expected Discharge Plan and Services                                               Social Drivers of Health (SDOH) Interventions SDOH Screenings   Food Insecurity: No Food Insecurity (06/29/2024)  Housing: Low Risk  (06/29/2024)  Transportation Needs: No Transportation Needs (06/29/2024)  Utilities: Not At Risk (06/29/2024)  Alcohol Screen: Low Risk  (12/01/2023)  Depression (PHQ2-9): Low Risk  (04/07/2024)  Financial Resource Strain: Low Risk  (04/06/2024)  Physical Activity: Inactive (04/06/2024)  Social Connections: Moderately Isolated (06/29/2024)  Stress: No Stress Concern Present (04/06/2024)  Tobacco Use: Low Risk  (06/29/2024)  Health Literacy: Adequate Health Literacy (12/01/2023)    Readmission Risk Interventions    10/17/2021   11:29 AM  Readmission Risk Prevention Plan  Transportation Screening Complete  PCP or Specialist Appt within 5-7 Days Complete  Home Care Screening Complete  Medication Review (RN CM) Complete

## 2024-07-05 NOTE — Progress Notes (Signed)
   07/05/24 2324  BiPAP/CPAP/SIPAP  $ Non-Invasive Ventilator  Non-Invasive Vent Subsequent  BiPAP/CPAP/SIPAP Pt Type Adult  BiPAP/CPAP/SIPAP Resmed  Mask Type Full face mask  Mask Size Small  IPAP 10 cmH20  EPAP 5 cmH2O  FiO2 (%) 28 %  Flow Rate 2 lpm  Patient Home Machine No  Patient Home Mask No  Patient Home Tubing No  Auto Titrate No  CPAP/SIPAP surface wiped down Yes  Device Plugged into RED Power Outlet Yes  BiPAP/CPAP /SiPAP Vitals  Pulse Rate 66  Resp 18  SpO2 95 %  Bilateral Breath Sounds Clear  MEWS Score/Color  MEWS Score 0  MEWS Score Color Landy

## 2024-07-05 NOTE — Progress Notes (Signed)
 [facility]  PROGRESS NOTE  Kayla Bennett FMW:[id number] DOB: [birth year] DOA: 06/29/2024 PCP: Alvia Bring, DO   Brief HPI:   73 y.o. female with history of diabetes mellitus type 2, persistent atrial fibrillation, hypertension, history of breast cancer status postlumpectomy and radiation in 2022, sleep apnea, chronic anemia, balance issues, chronic dyspnea had a fall at the doctor's office when patient was trying to turn around.  Denies hitting her head or losing consciousness.  She fell onto her left side and hurt the left side of the chest.   ED Course: In the ER patient had CT scan done of her chest abdomen and pelvis which shows features concerning for multiple rib fractures on the left side.  There are also old rib fractures on the right side.  Trauma surgeon was consulted and requested admission to hospitalist since there was no displaced fractures or pleural effusion.  Labs show hemoglobin of 11.5 creatinine 0.9 blood glucose 155 EKG shows normal sinus rhythm.    Assessment/Plan:   Multiple rib fractures on the left side after mechanical fall.     Continue Robaxin , gabapentin , lidocaine  patch, oxycodone .   Pain was not improving with Robaxin , Robaxin  discontinued and patient started on tizanidine 2 mg p.o. 3 times daily.  Pain has improved with tizanidine. .   Continue gabapentin  300 mg daily, oxycodone  10 mg every 6 hours as needed    incentive spirometer.   There are multiple old rib fractures on the right side.   PT/OT evaluation obtained.   Recommend to go to skilled nursing facility for rehab  Paroxysmal atrial fibrillation presently in sinus rhythm, takes Eliquis  and metoprolol .  Hypertension on losartan   Chronic diastolic dysfunction on Lasix .  Last 2D echo done in April showed EF of 60 to 65% with grade 2 diastolic dysfunction.  Sleep apnea on BiPAP at bedtime.  Hyperlipidemia on Repatha .  Chronic anemia hemoglobin is stable.  Follow CBC in a.m.  Diabetes  mellitus type 2 takes metformin  at home.  Last hemoglobin A1c was 6.3.  On sliding scale coverage.  CBG well-controlled  History of breast cancer status postlumpectomy radiation therapy on tamoxifen .  Chronic pain on Effexor  and gabapentin .  Anemia appears to be chronic follow CBC.    Medications     apixaban   5 mg Oral BID   calcium  carbonate  500 mg Oral q AM   cholecalciferol   1,000 Units Oral Daily   diclofenac  Sodium  2 g Topical QID   furosemide   40 mg Oral Daily   gabapentin   300 mg Oral BID   insulin  aspart  0-9 Units Subcutaneous TID WC   lidocaine   1 patch Transdermal Q24H   losartan   100 mg Oral q AM   methocarbamol   750 mg Oral QID   metoprolol  succinate  100 mg Oral q AM   pantoprazole   40 mg Oral BID   potassium chloride  SA  20 mEq Oral Daily   tamoxifen   20 mg Oral q morning   venlafaxine  XR  112.5 mg Oral QHS     Data Reviewed:   CBG:  Recent Labs  Lab 07/03/24 2120 07/04/24 0813 07/04/24 1156 07/04/24 1711 07/04/24 1748  GLUCAP 147* 156* 167* 164* 150*    SpO2: 95 % O2 Flow Rate (L/min): 1 L/min FiO2 (%): 28 %    Vitals:   07/04/24 2007 07/04/24 2356 07/05/24 0354 07/05/24 0611  BP: (!) 146/79  135/65 (!) 146/69  Pulse: 72 72 67 66  Resp: 20 20  16   Temp: 98.1 F (36.7 C)  97.7 F (36.5 C)   TempSrc: Oral  Oral   SpO2: 96% 96% 95%   Weight:      Height:          Data Reviewed:  Basic Metabolic Panel: Recent Labs  Lab 06/29/24 1546 06/30/24 0645 07/01/24 0714 07/02/24 1104 07/04/24 0451  NA 140 136 140 139 141  K 4.1 4.3 3.8 4.0 4.3  CL 102 101 101 103 104  CO2 27 24 28 25 29   GLUCOSE 155* 129* 150* 163* 150*  BUN 20 17 15 20 19   CREATININE 0.95 0.73 0.79 0.74 0.86  CALCIUM  9.3 8.9 8.9 8.6* 8.8*    CBC: Recent Labs  Lab 06/29/24 1546 06/30/24 0645 07/04/24 0451  WBC 6.1 6.2 6.0  HGB 11.5* 11.5* 11.1*  HCT 36.4 36.0 34.0*  MCV 98.4 98.6 96.6  PLT 168 159 168    LFT No results for input(s): AST, ALT,  ALKPHOS, BILITOT, PROT, ALBUMIN in the last 168 hours.   Antibiotics: Anti-infectives (From admission, onward)    None        DVT prophylaxis: Apixaban   Code Status: Full code  Family Communication: No family at bedside   CONSULTS    Subjective   Patient says that pain has improved.  Objective    Physical Examination:  Appears in no acute distress S1-S2, regular Lungs clear to auscultation bilaterally Abdomen is soft, nontender, no organomegaly Extremities no edema  Status is: Inpatient:             Kayla Bennett   [facility] If 7PM-7AM, please contact night-coverage at [email or web address], Office  [phone]   07/05/2024, 8:30 AM  LOS: 4 days

## 2024-07-06 DIAGNOSIS — S2242XA Multiple fractures of ribs, left side, initial encounter for closed fracture: Secondary | ICD-10-CM | POA: Diagnosis not present

## 2024-07-06 DIAGNOSIS — W19XXXA Unspecified fall, initial encounter: Secondary | ICD-10-CM | POA: Diagnosis not present

## 2024-07-06 LAB — GLUCOSE, CAPILLARY
Glucose-Capillary: 166 mg/dL — ABNORMAL HIGH (ref 70–99)
Glucose-Capillary: 119 mg/dL — ABNORMAL HIGH (ref 70–99)
Glucose-Capillary: 143 mg/dL — ABNORMAL HIGH (ref 70–99)
Glucose-Capillary: 215 mg/dL — ABNORMAL HIGH (ref 70–99)

## 2024-07-06 MED ORDER — SORBITOL 70 % SOLN: 30.0000 mL | Freq: Every day | Status: DC | PRN

## 2024-07-06 MED ORDER — OXYCODONE HCL 5 MG PO TABS: 10.0000 mg | ORAL_TABLET | Freq: Four times a day (QID) | ORAL | Status: DC | PRN

## 2024-07-06 MED ORDER — OXYCODONE HCL 5 MG PO TABS
5.0000 mg | ORAL_TABLET | Freq: Four times a day (QID) | ORAL | Status: DC | PRN
  Administered 2024-07-07 (×2): 5 mg via ORAL
  Filled 2024-07-06 (×2): qty 1

## 2024-07-06 MED ORDER — SENNOSIDES-DOCUSATE SODIUM 8.6-50 MG PO TABS
2.0000 | ORAL_TABLET | Freq: Two times a day (BID) | ORAL | Status: DC
  Administered 2024-07-06 – 2024-07-07 (×2): 2 via ORAL
  Filled 2024-07-06 (×3): qty 2

## 2024-07-06 MED ORDER — ACETAMINOPHEN 500 MG PO TABS
1000.0000 mg | ORAL_TABLET | Freq: Three times a day (TID) | ORAL | Status: DC
  Administered 2024-07-06 – 2024-07-07 (×4): 1000 mg via ORAL
  Filled 2024-07-06 (×4): qty 2

## 2024-07-06 MED ORDER — POLYETHYLENE GLYCOL 3350 17 G PO PACK
17.0000 g | PACK | Freq: Every day | ORAL | Status: DC
  Administered 2024-07-06 – 2024-07-07 (×2): 17 g via ORAL
  Filled 2024-07-06 (×2): qty 1

## 2024-07-06 MED ORDER — CYCLOBENZAPRINE HCL 10 MG PO TABS
10.0000 mg | ORAL_TABLET | Freq: Three times a day (TID) | ORAL | Status: DC
  Administered 2024-07-06 – 2024-07-07 (×4): 10 mg via ORAL
  Filled 2024-07-06 (×4): qty 1

## 2024-07-06 MED ORDER — GABAPENTIN 300 MG PO CAPS
300.0000 mg | ORAL_CAPSULE | Freq: Three times a day (TID) | ORAL | Status: DC
  Administered 2024-07-06 – 2024-07-07 (×4): 300 mg via ORAL
  Filled 2024-07-06 (×4): qty 1

## 2024-07-06 NOTE — Progress Notes (Signed)
 Physical Therapy Treatment Patient Details Name: Kayla Bennett MRN: [id number] DOB: 21-Aug-1951 Today's Date: 07/06/2024   History of Present Illness Kayla Bennett is a 73 y.o. female who presented to [facility] ED 06/29/24 s/p fall at the doctor's office after she lost her balance. CT scan shows features concerning for multiple rib fractures on the left side (7-10) and also old rib fractures on the right side. PMHx: T2DM, persistent A-Fib, HTN, breast cancer s/p lumpectomy and radiation in 2022, sleep apnea, osteopenia, chronic anemia, balance issues, and chronic dyspnea.    PT Comments  Patient progressing with mobility and able to manage to get up with +1 A (still with spasms) and to [facility].  Seems able to step easier today and even return to supine with less pain than coming up to sit.  Wiped herself while on [facility] though not successful to have BM.  PT will continue to follow.    If plan is discharge home, recommend the following: Assist for transportation;Help with stairs or ramp for entrance;Assistance with cooking/housework;A lot of help with walking and/or transfers;A lot of help with bathing/dressing/bathroom   Can travel by private vehicle     No  Equipment Recommendations  Wheelchair (measurements PT);Rolling walker (2 wheels)    Recommendations for Other Services       Precautions / Restrictions Precautions Precautions: Fall Precaution/Restrictions Comments: abdominal binder     Mobility  Bed Mobility Overal bed mobility: Needs Assistance Bed Mobility: Supine to Sit, Sit to Supine Rolling: Used rails   Supine to sit: HOB elevated, Used rails, Mod assist Sit to supine: Mod assist   General bed mobility comments: up using HOB up though still with L posterior spasms, to supine A for one leg onto bed at a time, rolled to remove binder    Transfers Overall transfer level: Needs assistance Equipment used: Rolling walker (2 wheels) Transfers: Sit to/from Stand, Bed to  chair/wheelchair/BSC Sit to Stand: Min assist   Step pivot transfers: Min assist       General transfer comment: stood from EOB pushing up with one hand from bed with min A for balance/safety and stepping to BSC with RW and min A    Ambulation/Gait Ambulation/Gait assistance: Contact guard assist Gait Distance (Feet): 3 Feet Assistive device: Rolling walker (2 wheels) Gait Pattern/deviations: Step-to pattern, Shuffle, Decreased stride length       General Gait Details: walked up to [facility] from [facility]   Stairs             Wheelchair Mobility     Tilt Bed    Modified Rankin (Stroke Patients Only)       Balance Overall balance assessment: Needs assistance   Sitting balance-Leahy Scale: Good Sitting balance - Comments: leaning over to wipe after toileting with increased time CGA   Standing balance support: Bilateral upper extremity supported Standing balance-Leahy Scale: Poor Standing balance comment: heavily reliant on RW                            Communication Communication Communication: No apparent difficulties  Cognition Arousal: Alert Behavior During Therapy: WFL for tasks assessed/performed   PT - Cognitive impairments: No apparent impairments                         Following commands: Intact      Cueing Cueing Techniques: Verbal cues  Exercises      General Comments  General comments (skin integrity, edema, etc.): sister in the room and supportive, nauseated after short walk though passed in sitting EOB; unable to have BM on [facility]      Pertinent Vitals/Pain Pain Assessment Faces Pain Scale: Hurts worst Pain Location: L posterior spasms with mobility Pain Descriptors / Indicators: Spasm, Guarding, Grimacing, Aching, Moaning Pain Intervention(s): Monitored during session, Repositioned, RN gave pain meds during session    Home Living                          Prior Function            PT Goals (current goals  can now be found in the care plan section) Progress towards PT goals: Progressing toward goals    Frequency    Min 2X/week      PT Plan      Co-evaluation              AM-PAC PT 6 Clicks Mobility   Outcome Measure  Help needed turning from your back to your side while in a flat bed without using bedrails?: A Lot Help needed moving from lying on your back to sitting on the side of a flat bed without using bedrails?: A Lot Help needed moving to and from a bed to a chair (including a wheelchair)?: A Lot Help needed standing up from a chair using your arms (e.g., wheelchair or bedside chair)?: A Lot Help needed to walk in hospital room?: Total Help needed climbing 3-5 steps with a railing? : Total 6 Click Score: 10    End of Session Equipment Utilized During Treatment: Gait belt;Other (comment) (binder) Activity Tolerance: Patient limited by pain Patient left: in bed;with call bell/phone within reach;with bed alarm set   PT Visit Diagnosis: Other abnormalities of gait and mobility (R26.89);Muscle weakness (generalized) (M62.81);Difficulty in walking, not elsewhere classified (R26.2);Pain Pain - Right/Left: Left Pain - part of body:  (ribs)     Time: 8384-8344 PT Time Calculation (min) (ACUTE ONLY): 40 min  Charges:    $Therapeutic Activity: 23-37 mins PT General Charges $$ ACUTE PT VISIT: 1 Visit                     Micheline Portal, PT Acute Rehabilitation Services Office:[phone] 07/06/2024    Montie Portal 07/06/2024, 6:04 PM

## 2024-07-06 NOTE — TOC Progression Note (Signed)
 Transition of Care [facility]) - Progression Note    Patient Details  Name: Kayla Bennett MRN: [id number] Date of Birth: 08-06-1951  [facility]) CM/SW Contact  Janeane Cozart LITTIE Moose, [place] Phone Number: 07/06/2024, 2:07 PM  Clinical Narrative:    Pt chose [facility] for SNF. CSW will confirm bed availability and continue to follow.                     Expected Discharge Plan and Services                                               Social Drivers of Health (SDOH) Interventions SDOH Screenings   Food Insecurity: No Food Insecurity (06/29/2024)  Housing: Low Risk  (06/29/2024)  Transportation Needs: No Transportation Needs (06/29/2024)  Utilities: Not At Risk (06/29/2024)  Alcohol Screen: Low Risk  (12/01/2023)  Depression (PHQ2-9): Low Risk  (04/07/2024)  Financial Resource Strain: Low Risk  (04/06/2024)  Physical Activity: Inactive (04/06/2024)  Social Connections: Moderately Isolated (06/29/2024)  Stress: No Stress Concern Present (04/06/2024)  Tobacco Use: Low Risk  (06/29/2024)  Health Literacy: Adequate Health Literacy (12/01/2023)    Readmission Risk Interventions    10/17/2021   11:29 AM  Readmission Risk Prevention Plan  Transportation Screening Complete  PCP or Specialist Appt within 5-7 Days Complete  Home Care Screening Complete  Medication Review (RN CM) Complete

## 2024-07-06 NOTE — Plan of Care (Signed)

## 2024-07-06 NOTE — Progress Notes (Signed)
 [facility] Progress Note Patient: Kayla Bennett FMW:[id number] DOB: Aug 17, 1951 DOA: 06/29/2024  DOS: the patient was seen and examined on 07/06/2024  Brief Hospital Course: PMH of type II DM, A-fib, HTN, breast cancer SP lumpectomy and radiation, OSA, recurrent fall presented to the hospital with complaints of a fall at doctor's office. Denies hitting her head or losing consciousness.  She fell onto her left side and hurt the left side of the chest. CT chest abdomen and pelvis shows features concerning for multiple rib fractures on the left side.  There are also old rib fractures on the right side.  Trauma surgeon was consulted and requested admission to hospitalist since there was no displaced fractures or pleural effusion.    Assessment and plan. Acute multiple rib fractures  On the left side after mechanical fall. Respiratory status stable. Pain not well-controlled. Switching to scheduled Tylenol , switching Robaxin  to Flexeril , increasing gabapentin  to 300 mg 3 times daily, continuing oxycodone  as needed and continuing diclofenac . Also has abdominal binder. PT OT consulted.  Recommend SNF.  Paroxysmal atrial fibrillation  presently in sinus rhythm, takes Eliquis  and metoprolol .   Hypertension  on losartan    Chronic diastolic dysfunction  On Lasix . Last 2D echo done in April showed EF of 60 to 65% with grade 2 diastolic dysfunction. We may switch to as needed the medicine in case her blood pressure drops.   Sleep apnea  on BiPAP at bedtime. Difficulties with rib fractures. Oxygen  as needed.   Hyperlipidemia  on Repatha .   Chronic anemia  hemoglobin is stable.   Diabetes mellitus type 2 without long-term insulin  use without complication  Well-controlled. Takes metformin  at home.  Last hemoglobin A1c was 6.3. Switch to heart healthym diet.  History of breast cancer  status postlumpectomy radiation therapy on tamoxifen .   Chronic pain  on Effexor  and  gabapentin .   Anemia  appears to be chronic follow CBC.  Obesity Class 1 Body mass index is 36.58 kg/m.  Placing the pt at higher risk of poor outcomes.   Constipation. Initiating bowel regimen.   Subjective: No nausea or vomiting.  Reports severe spasmodic pain.  No fever no chills.  Passing gas.  No BM.  Physical Exam: Clear to auscultation. S1-S2 present Bowel sounds present No edema.  Data Reviewed: I have Reviewed nursing notes, Vitals, and Lab results. Since last encounter, pertinent lab results CBC and BMP   . I have ordered test including CBC and BMP  .   Disposition: Status is: Inpatient Remains inpatient appropriate because: Monitor for improvement in pain control. apixaban  (ELIQUIS ) tablet 5 mg   Family Communication: No one at bedside Level of care: Telemetry Medical continue Vitals:   07/05/24 2324 07/06/24 0416 07/06/24 0856 07/06/24 1713  BP:  125/67 137/78 111/77  Pulse: 66 66 81 75  Resp: 18 18 17 18   Temp:  98.2 F (36.8 C) 98.1 F (36.7 C) 98.1 F (36.7 C)  TempSrc:  Oral Oral Oral  SpO2: 95% 99% 91% 92%  Weight:      Height:         Author: Yetta Blanch, MD 07/06/2024 7:00 PM  Please look on [email or web address] to find out who is on call.

## 2024-07-06 NOTE — Plan of Care (Signed)

## 2024-07-06 NOTE — Hospital Course (Addendum)
 PMH of type II DM, A-fib, HTN, breast cancer SP lumpectomy and radiation, OSA, recurrent fall presented to the hospital with complaints of a fall at doctor's office. Denies hitting her head or losing consciousness.  She fell onto her left side and hurt the left side of the chest. CT chest abdomen and pelvis shows features concerning for multiple rib fractures on the left side.  There are also old rib fractures on the right side.  Trauma surgeon was consulted and requested admission to hospitalist since there was no displaced fractures or pleural effusion.    Assessment and plan. Acute multiple rib fractures  On the left side after mechanical fall. Respiratory status stable. Pain not well-controlled. Switching to scheduled Tylenol , switching Robaxin  to Flexeril , increasing gabapentin  to 300 mg 3 times daily, continuing oxycodone  as needed and continuing diclofenac . Also has abdominal binder. PT OT consulted.  Recommend SNF.  Paroxysmal atrial fibrillation  presently in sinus rhythm, takes Eliquis  and metoprolol .   Hypertension  on losartan    Chronic diastolic dysfunction  On Lasix . Last 2D echo done in April showed EF of 60 to 65% with grade 2 diastolic dysfunction. We may switch to as needed the medicine in case her blood pressure drops.   Sleep apnea  on BiPAP at bedtime. Difficulties with rib fractures. Oxygen  as needed.   Hyperlipidemia  on Repatha .   Chronic anemia  hemoglobin is stable.   Diabetes mellitus type 2 without long-term insulin  use without complication  Well-controlled. Takes metformin  at home.  Last hemoglobin A1c was 6.3. Switch to heart healthym diet.  History of breast cancer  status postlumpectomy radiation therapy on tamoxifen .   Chronic pain  on Effexor  and gabapentin .   Anemia  appears to be chronic follow CBC.  Obesity Class 1 Body mass index is 36.58 kg/m.  Placing the pt at higher risk of poor outcomes.   Constipation. Initiating bowel  regimen.

## 2024-07-07 DIAGNOSIS — F5104 Psychophysiologic insomnia: Secondary | ICD-10-CM | POA: Diagnosis not present

## 2024-07-07 DIAGNOSIS — M549 Dorsalgia, unspecified: Secondary | ICD-10-CM | POA: Diagnosis not present

## 2024-07-07 DIAGNOSIS — E1140 Type 2 diabetes mellitus with diabetic neuropathy, unspecified: Secondary | ICD-10-CM | POA: Diagnosis not present

## 2024-07-07 DIAGNOSIS — K219 Gastro-esophageal reflux disease without esophagitis: Secondary | ICD-10-CM | POA: Diagnosis not present

## 2024-07-07 DIAGNOSIS — I110 Hypertensive heart disease with heart failure: Secondary | ICD-10-CM | POA: Diagnosis not present

## 2024-07-07 DIAGNOSIS — D649 Anemia, unspecified: Secondary | ICD-10-CM | POA: Diagnosis not present

## 2024-07-07 DIAGNOSIS — F32A Depression, unspecified: Secondary | ICD-10-CM | POA: Diagnosis not present

## 2024-07-07 DIAGNOSIS — E119 Type 2 diabetes mellitus without complications: Secondary | ICD-10-CM | POA: Diagnosis not present

## 2024-07-07 DIAGNOSIS — I509 Heart failure, unspecified: Secondary | ICD-10-CM | POA: Diagnosis not present

## 2024-07-07 DIAGNOSIS — G4733 Obstructive sleep apnea (adult) (pediatric): Secondary | ICD-10-CM | POA: Diagnosis not present

## 2024-07-07 DIAGNOSIS — M1990 Unspecified osteoarthritis, unspecified site: Secondary | ICD-10-CM | POA: Diagnosis not present

## 2024-07-07 DIAGNOSIS — I10 Essential (primary) hypertension: Secondary | ICD-10-CM | POA: Diagnosis not present

## 2024-07-07 DIAGNOSIS — Z7901 Long term (current) use of anticoagulants: Secondary | ICD-10-CM | POA: Diagnosis not present

## 2024-07-07 DIAGNOSIS — S2249XD Multiple fractures of ribs, unspecified side, subsequent encounter for fracture with routine healing: Secondary | ICD-10-CM | POA: Diagnosis not present

## 2024-07-07 DIAGNOSIS — W19XXXD Unspecified fall, subsequent encounter: Secondary | ICD-10-CM | POA: Diagnosis not present

## 2024-07-07 DIAGNOSIS — G4730 Sleep apnea, unspecified: Secondary | ICD-10-CM | POA: Diagnosis not present

## 2024-07-07 DIAGNOSIS — S2242XA Multiple fractures of ribs, left side, initial encounter for closed fracture: Secondary | ICD-10-CM | POA: Diagnosis not present

## 2024-07-07 DIAGNOSIS — G4731 Primary central sleep apnea: Secondary | ICD-10-CM | POA: Insufficient documentation

## 2024-07-07 DIAGNOSIS — Z7401 Bed confinement status: Secondary | ICD-10-CM | POA: Diagnosis not present

## 2024-07-07 DIAGNOSIS — J45909 Unspecified asthma, uncomplicated: Secondary | ICD-10-CM | POA: Diagnosis not present

## 2024-07-07 DIAGNOSIS — R5381 Other malaise: Secondary | ICD-10-CM | POA: Diagnosis not present

## 2024-07-07 DIAGNOSIS — S2242XD Multiple fractures of ribs, left side, subsequent encounter for fracture with routine healing: Secondary | ICD-10-CM | POA: Diagnosis not present

## 2024-07-07 DIAGNOSIS — I4891 Unspecified atrial fibrillation: Secondary | ICD-10-CM | POA: Diagnosis not present

## 2024-07-07 DIAGNOSIS — C50411 Malignant neoplasm of upper-outer quadrant of right female breast: Secondary | ICD-10-CM | POA: Diagnosis not present

## 2024-07-07 DIAGNOSIS — G8929 Other chronic pain: Secondary | ICD-10-CM | POA: Diagnosis not present

## 2024-07-07 DIAGNOSIS — E785 Hyperlipidemia, unspecified: Secondary | ICD-10-CM | POA: Diagnosis not present

## 2024-07-07 DIAGNOSIS — K5900 Constipation, unspecified: Secondary | ICD-10-CM | POA: Diagnosis not present

## 2024-07-07 DIAGNOSIS — M810 Age-related osteoporosis without current pathological fracture: Secondary | ICD-10-CM | POA: Insufficient documentation

## 2024-07-07 DIAGNOSIS — W19XXXA Unspecified fall, initial encounter: Secondary | ICD-10-CM | POA: Insufficient documentation

## 2024-07-07 DIAGNOSIS — I5032 Chronic diastolic (congestive) heart failure: Secondary | ICD-10-CM | POA: Diagnosis not present

## 2024-07-07 DIAGNOSIS — Z7984 Long term (current) use of oral hypoglycemic drugs: Secondary | ICD-10-CM | POA: Diagnosis not present

## 2024-07-07 DIAGNOSIS — I480 Paroxysmal atrial fibrillation: Secondary | ICD-10-CM | POA: Diagnosis not present

## 2024-07-07 DIAGNOSIS — R0782 Intercostal pain: Secondary | ICD-10-CM | POA: Diagnosis not present

## 2024-07-07 DIAGNOSIS — E66811 Obesity, class 1: Secondary | ICD-10-CM | POA: Diagnosis not present

## 2024-07-07 DIAGNOSIS — M797 Fibromyalgia: Secondary | ICD-10-CM | POA: Diagnosis not present

## 2024-07-07 LAB — CBC
HCT: 33.3 % — ABNORMAL LOW (ref 36.0–46.0)
Hemoglobin: 10.7 g/dL — ABNORMAL LOW (ref 12.0–15.0)
MCH: 31.4 pg (ref 26.0–34.0)
MCHC: 32.1 g/dL (ref 30.0–36.0)
MCV: 97.7 fL (ref 80.0–100.0)
Platelets: 194 K/uL (ref 150–400)
RBC: 3.41 MIL/uL — ABNORMAL LOW (ref 3.87–5.11)
RDW: 12.9 % (ref 11.5–15.5)
WBC: 4.6 K/uL (ref 4.0–10.5)
nRBC: 0 % (ref 0.0–0.2)

## 2024-07-07 LAB — BASIC METABOLIC PANEL WITH GFR
Anion gap: 15 (ref 5–15)
BUN: 14 mg/dL (ref 8–23)
CO2: 25 mmol/L (ref 22–32)
Calcium: 8.7 mg/dL — ABNORMAL LOW (ref 8.9–10.3)
Chloride: 102 mmol/L (ref 98–111)
Creatinine, Ser: 0.66 mg/dL (ref 0.44–1.00)
GFR, Estimated: >60 mL/min (ref >60)
Glucose, Bld: 114 mg/dL — ABNORMAL HIGH (ref 70–99)
Potassium: 3.7 mmol/L (ref 3.5–5.1)
Sodium: 142 mmol/L (ref 135–145)

## 2024-07-07 LAB — MAGNESIUM: Magnesium: 1.8 mg/dL (ref 1.7–2.4)

## 2024-07-07 LAB — GLUCOSE, CAPILLARY
Glucose-Capillary: 111 mg/dL — ABNORMAL HIGH (ref 70–99)
Glucose-Capillary: 131 mg/dL — ABNORMAL HIGH (ref 70–99)
Glucose-Capillary: 158 mg/dL — ABNORMAL HIGH (ref 70–99)

## 2024-07-07 MED ORDER — DICLOFENAC SODIUM 1 % EX GEL
2.0000 g | Freq: Four times a day (QID) | CUTANEOUS | Status: AC
Start: 2024-07-07 — End: ?

## 2024-07-07 MED ORDER — LIDOCAINE 5 % EX PTCH: 1.0000 | MEDICATED_PATCH | CUTANEOUS | 0 refills | Status: DC

## 2024-07-07 MED ORDER — SENNOSIDES-DOCUSATE SODIUM 8.6-50 MG PO TABS: 2.0000 | ORAL_TABLET | Freq: Two times a day (BID) | ORAL | 0 refills | Status: AC

## 2024-07-07 MED ORDER — FERROUS SULFATE 325 (65 FE) MG PO TABS
325.0000 mg | ORAL_TABLET | Freq: Every evening | ORAL | Status: DC
Start: 2024-07-07 — End: 2024-07-08
  Administered 2024-07-07: 325 mg via ORAL
  Filled 2024-07-07: qty 1

## 2024-07-07 MED ORDER — POLYETHYLENE GLYCOL 3350 17 G PO PACK: 17.0000 g | PACK | Freq: Every day | ORAL | 0 refills | Status: AC

## 2024-07-07 MED ORDER — BISACODYL 10 MG RE SUPP
10.0000 mg | Freq: Once | RECTAL | Status: AC
  Administered 2024-07-07: 10 mg via RECTAL
  Filled 2024-07-07: qty 1

## 2024-07-07 MED ORDER — GABAPENTIN 300 MG PO CAPS: 300.0000 mg | ORAL_CAPSULE | Freq: Three times a day (TID) | ORAL | 0 refills | Status: DC

## 2024-07-07 MED ORDER — BISACODYL 10 MG RE SUPP: 10.0000 mg | Freq: Every day | RECTAL | 0 refills | Status: DC | PRN

## 2024-07-07 MED ORDER — SIMETHICONE 80 MG PO CHEW: 80.0000 mg | CHEWABLE_TABLET | Freq: Four times a day (QID) | ORAL | 0 refills | Status: DC | PRN

## 2024-07-07 MED ORDER — CYCLOBENZAPRINE HCL 10 MG PO TABS: 10.0000 mg | ORAL_TABLET | Freq: Three times a day (TID) | ORAL | 0 refills | Status: DC

## 2024-07-07 MED ORDER — OXYCODONE HCL 5 MG PO TABS: 5.0000 mg | ORAL_TABLET | Freq: Four times a day (QID) | ORAL | 0 refills | Status: DC | PRN

## 2024-07-07 MED ORDER — ACETAMINOPHEN 500 MG PO TABS: 1000.0000 mg | ORAL_TABLET | Freq: Three times a day (TID) | ORAL | 0 refills | Status: AC

## 2024-07-07 NOTE — Progress Notes (Signed)
 Mobility Specialist Progress Note:    07/07/24 0936  Mobility  Activity Ambulated with assistance (To BSC/ chair)  Level of Assistance Standby assist, set-up cues, supervision of patient - no hands on  Assistive Device Front wheel walker  Distance Ambulated (ft) 10 ft  Activity Response Tolerated well  Mobility Referral Yes  Mobility visit 1 Mobility  Mobility Specialist Start Time (ACUTE ONLY) 0845  Mobility Specialist Stop Time (ACUTE ONLY) 0915  Mobility Specialist Time Calculation (min) (ACUTE ONLY) 30 min   Received pt in bed and agreeable to mobility. No physical assistance needed. C/o back pain, otherwise tolerated well. Pt requested to use BSC prior to ambulation. Pt ambulated halfway around the bed to chair but became fatigued and sat in chair. Pt left in chair with personal belongings and call light within reach. All needs met.   Lavanda Pollack Mobility Specialist  Please contact via [employer] or  Rehab Office [phone]

## 2024-07-07 NOTE — Progress Notes (Signed)
 PT[facility] arrived to take pt to [facility].

## 2024-07-07 NOTE — TOC Transition Note (Signed)
 Transition of Care [facility]) - Discharge Note   Patient Details  Name: Kayla Bennett MRN: [id number] Date of Birth: Jan 25, 1951  Transition of Care [facility]) CM/SW Contact:  Jeoffrey LITTIE Moose, [place] Phone Number: 07/07/2024, 2:50 PM   Clinical Narrative:    Patient will DC to: [facility] Anticipated DC date: 07/07/24 Family notified: Yes Transport by: [facility]   Per MD patient ready for DC to [facility]. RN to call report prior to discharge [phone] Room 410. RN, patient, patient's family, and facility notified of DC. Discharge Summary and FL2 sent to facility. DC packet on chart. Ambulance transport requested for patient.   CSW will sign off for now as social work intervention is no longer needed. Please consult us  again if new needs arise.     Final next level of care: Skilled Nursing Facility Barriers to Discharge: Barriers Resolved   Patient Goals and CMS Choice Patient states their goals for this hospitalization and ongoing recovery are:: SNF          Discharge Placement   Existing PASRR number confirmed : 07/07/24          Patient chooses bed at: Other - please specify in the comment section below: ([facility]) Patient to be transferred to facility by: PTAR Name of family member notified: Dempsey Patient and family notified of of transfer: 07/07/24  Discharge Plan and Services Additional resources added to the After Visit Summary for                                       Social Drivers of Health (SDOH) Interventions SDOH Screenings   Food Insecurity: No Food Insecurity (06/29/2024)  Housing: Low Risk  (06/29/2024)  Transportation Needs: No Transportation Needs (06/29/2024)  Utilities: Not At Risk (06/29/2024)  Alcohol Screen: Low Risk  (12/01/2023)  Depression (PHQ2-9): Low Risk  (04/07/2024)  Financial Resource Strain: Low Risk  (04/06/2024)  Physical Activity: Inactive (04/06/2024)  Social Connections: Moderately Isolated (06/29/2024)  Stress: No Stress  Concern Present (04/06/2024)  Tobacco Use: Low Risk  (06/29/2024)  Health Literacy: Adequate Health Literacy (12/01/2023)     Readmission Risk Interventions    10/17/2021   11:29 AM  Readmission Risk Prevention Plan  Transportation Screening Complete  PCP or Specialist Appt within 5-7 Days Complete  Home Care Screening Complete  Medication Review (RN CM) Complete

## 2024-07-07 NOTE — Progress Notes (Signed)
 Avelina SQUIBB Feldmeier to be D/C'd  per MD order.  Discussed with the Debo, RN at [facility] and all questions fully answered.  VSS, Skin clean, dry and intact without evidence of skin break down, no evidence of skin tears noted.  IV catheter discontinued intact. Site without signs and symptoms of complications. Dressing and pressure applied.  An After Visit Summary was printed and given to the PTAR.

## 2024-07-07 NOTE — Progress Notes (Signed)
 Mobility Specialist Progress Note:    07/07/24 1033  Mobility  Activity Pivoted/transferred from chair to bed  Level of Assistance Standby assist, set-up cues, supervision of patient - no hands on  Assistive Device Front wheel walker  Distance Ambulated (ft) 5 ft  Activity Response Tolerated well  Mobility Referral Yes  Mobility visit 1 Mobility  Mobility Specialist Start Time (ACUTE ONLY) 1015  Mobility Specialist Stop Time (ACUTE ONLY) 1027  Mobility Specialist Time Calculation (min) (ACUTE ONLY) 12 min   Received pt calling out requesting to get to bed. No physical assistance needed. C/o back pain, otherwise tolerated well. Returned pt to bed with personal belongings and call light within reach. All needs met. LPN present.  Kayla Bennett Mobility Specialist  Please contact via [employer] or  Rehab Office [phone]

## 2024-07-07 NOTE — Discharge Summary (Signed)
 Physician Discharge Summary   Patient: Kayla Bennett MRN: [id number] DOB: [birth year]  Admit date:     06/29/2024  Discharge date: 07/07/24  Discharge Physician: Yetta Blanch  PCP: Alvia Bring, DO  Recommendations at discharge: Follow-up with PCP in 1 week as recommended.   Contact information for follow-up providers     Alvia Bring, DO. Schedule an appointment as soon as possible for a visit in 1 week(s).   Specialty: Family Medicine Why: with BMP lab to look at kidney/electrolyte numbers, with CBC lab to look at blood counts Contact information: [address]  [address] [place] [address] [phone]              Contact information for after-discharge care     Destination     [facility] .   Service: Skilled Nursing Contact information: [address] [place]  [address] [phone]                    Discharge Diagnoses: Principal Problem:   Rib fractures Active Problems:   Malignant neoplasm of upper-outer quadrant of right breast in female, estrogen receptor positive (HCC)   Persistent atrial fibrillation (HCC)   Type 2 diabetes mellitus with obesity (HCC)   Hypertension   Anemia   Multiple rib fractures   Diastolic dysfunction with chronic heart failure [facility])  Hospital Course: PMH of type II DM, A-fib, HTN, breast cancer SP lumpectomy and radiation, OSA, recurrent fall presented to the hospital with complaints of a fall at doctor's office. Denies hitting her head or losing consciousness.  She fell onto her left side and hurt the left side of the chest. CT chest abdomen and pelvis shows features concerning for multiple rib fractures on the left side.  There are also old rib fractures on the right side.  Trauma surgeon was consulted and requested admission to hospitalist since there was no displaced fractures or pleural effusion.    Assessment and plan. Acute multiple rib fractures   On the left side after mechanical fall.  Left 7th through 10th ribs. Prior healing right rib fractures. Respiratory status stable. Pain controlled with scheduled Tylenol , scheduled Flexeril , gabapentin  300 mg 3 times daily as well as oxycodone . Also on scheduled diclofenac  cream as well as lidocaine  patch. Also has abdominal binder. PT OT consulted.  Recommend SNF.  Paroxysmal atrial fibrillation  Presently in sinus rhythm, takes Eliquis  and metoprolol .   Hypertension  on losartan    Chronic diastolic dysfunction  On Lasix . Last 2D echo done in April showed EF of 60 to 65% with grade 2 diastolic dysfunction.   Sleep apnea  On BiPAP at bedtime. Oxygen  as needed.   Hyperlipidemia  on Repatha .   Chronic anemia  hemoglobin is stable.   Diabetes mellitus type 2 without long-term insulin  use without complication  Well-controlled. Takes metformin  at home.  Last hemoglobin A1c was 6.3. Switch to heart healthym diet.  History of breast cancer  status postlumpectomy radiation therapy on tamoxifen .   Chronic pain  on Effexor  and gabapentin .   Anemia  appears to be chronic follow CBC.  Obesity Class 1 Body mass index is 36.58 kg/m.  Placing the pt at higher risk of poor outcomes.   Constipation. Initiating bowel regimen.  Fall. It appears that the patient lost her balance.  No dizziness or lightheadedness. At the time of my evaluation no focal deficit. CT head as well as C-spine including CT chest abdomen pelvis unremarkable other  than the rib fracture. ED provider discussed with Dr. Ann from trauma surgery,  Who recommended medicine admission for pain control. PT OT recommended. Pain control - [place]  Controlled Substance Reporting System database was reviewed. and patient was instructed, not to drive, operate heavy machinery, perform activities at heights, swimming or participation in water  activities or provide baby-sitting services while on Pain, Sleep and  Anxiety Medications; until their outpatient Physician has advised to do so again. Also recommended to not to take more than prescribed Pain, Sleep and Anxiety Medications.  Consultants:  None, EDP discussed with trauma surgery on-call.  Procedures performed:  None  DISCHARGE MEDICATION: Allergies as of 07/07/2024       Reactions   Lotensin [benazepril Hcl] Swelling   Morphine Itching, Rash   Erythromycin Base Other (See Comments)   Unknown reaction   Hydrocodone  Other (See Comments)   Unknown reaction   Other Swelling   Mustard - vaginal swelling/pain   Banana Swelling   vaginal swelling/pain   Chocolate Swelling   vaginal swelling/pain   Gluten Meal Diarrhea, Other (See Comments)   Upset stomach    Tizanidine Other (See Comments)   Lowered blood pressure    Wound Dressing Adhesive Other (See Comments)   Skin burns, blisters         Medication List     STOP taking these medications    ibuprofen  200 MG tablet Commonly known as: ADVIL        TAKE these medications    acetaminophen  500 MG tablet Commonly known as: TYLENOL  Take 2 tablets (1,000 mg total) by mouth every 8 (eight) hours. What changed:  how much to take when to take this reasons to take this Another medication with the same name was removed. Continue taking this medication, and follow the directions you see here.   albuterol  108 (90 Base) MCG/ACT inhaler Commonly known as: VENTOLIN  HFA Inhale 2 puffs into the lungs every 6 (six) hours as needed for wheezing or shortness of breath.   ascorbic acid 500 MG tablet Commonly known as: VITAMIN C Take 500 mg by mouth every evening.   bisacodyl  10 MG suppository Commonly known as: DULCOLAX Place 1 suppository (10 mg total) rectally daily as needed for moderate constipation.   CALCIUM  600 PO Take 600 mg by mouth in the morning.   cetirizine 10 MG tablet Commonly known as: ZYRTEC Take 10 mg by mouth in the morning.   cholecalciferol  25 MCG (1000  UNIT) tablet Commonly known as: VITAMIN D3 Take 1,000 Units by mouth in the morning and at bedtime.   cyclobenzaprine  10 MG tablet Commonly known as: FLEXERIL  Take 1 tablet (10 mg total) by mouth 3 (three) times daily.   diclofenac  Sodium 1 % Gel Commonly known as: VOLTAREN  Apply 2 g topically 4 (four) times daily.   Eliquis  5 MG Tabs tablet Generic drug: apixaban  TAKE ONE TABLET BY MOUTH TWICE DAILY   ferrous sulfate  325 (65 FE) MG tablet Take 325 mg by mouth every evening.   furosemide  40 MG tablet Commonly known as: LASIX  Take 1 tablet (40 mg total) by mouth daily. What changed: when to take this   gabapentin  300 MG capsule Commonly known as: NEURONTIN  Take 1 capsule (300 mg total) by mouth 3 (three) times daily. What changed:  medication strength when to take this   hydrocortisone 2.5 % cream Apply 1 Application topically 2 (two) times daily.   lidocaine  5 % Commonly known as: LIDODERM  Place 1 patch onto the skin  daily. Remove & Discard patch within 12 hours or as directed by MD   losartan  100 MG tablet Commonly known as: COZAAR  Take 1 tablet (100 mg total) by mouth daily. What changed: when to take this   metFORMIN  500 MG 24 hr tablet Commonly known as: GLUCOPHAGE -XR TAKE ONE TABLET BY MOUTH EVERY DAY What changed: when to take this   metoprolol  succinate 100 MG 24 hr tablet Commonly known as: TOPROL -XL Take 1 tablet (100 mg total) by mouth daily. Take with or immediately following a meal. What changed: when to take this   nystatin cream Commonly known as: MYCOSTATIN Apply 1 Application topically as needed for dry skin.   oxyCODONE  5 MG immediate release tablet Commonly known as: Oxy IR/ROXICODONE  Take 1-2 tablets (5-10 mg total) by mouth every 6 (six) hours as needed for moderate pain (pain score 4-6) or severe pain (pain score 7-10).   pantoprazole  40 MG tablet Commonly known as: PROTONIX  TAKE ONE TABLET BY MOUTH TWICE DAILY   polyethylene  glycol 17 g packet Commonly known as: MIRALAX  / GLYCOLAX  Take 17 g by mouth daily. Start taking on: July 08, 2024   potassium chloride  SA 20 MEQ tablet Commonly known as: KLOR-CON  M Take 1 tablet (20 mEq total) by mouth daily. What changed: when to take this   PRENATAL VITAMIN PLUS LOW IRON PO Take 1 tablet by mouth in the morning.   Repatha  SureClick 140 MG/ML [place] Generic drug: Evolocumab  Inject 140 mg into the skin every 14 (fourteen) days.   senna-docusate 8.6-50 MG tablet Commonly known as: Senokot-S Take 2 tablets by mouth 2 (two) times daily for 7 days.   simethicone  80 MG chewable tablet Commonly known as: MYLICON Chew 1 tablet (80 mg total) by mouth every 6 (six) hours as needed for flatulence.   tamoxifen  20 MG tablet Commonly known as: NOLVADEX  Take 1 tablet (20 mg total) by mouth every morning.   triamcinolone cream 0.1 % Commonly known as: KENALOG Apply 1 application  topically 2 (two) times daily as needed (psoriasis). Mixed with Cetaphil 1:1   UNABLE TO FIND OUTPATIENT PHYSICAL THERAPY    Diagnosis: Generalized debility due to UTI, acute gastroenteritis   venlafaxine  XR 37.5 MG 24 hr capsule Commonly known as: EFFEXOR -XR Take 1 capsule (37.5 mg total) by mouth at bedtime. What changed:  when to take this additional instructions   venlafaxine  XR 75 MG 24 hr capsule Commonly known as: EFFEXOR -XR TAKE ONE CAPSULE BY MOUTH EVERY DAY What changed:  when to take this additional instructions       Disposition: SNF Diet recommendation: Cardiac diet  Discharge Exam: Vitals:   07/06/24 1713 07/06/24 1949 07/07/24 0509 07/07/24 0804  BP: 111/77 (!) 103/55 121/64 137/74  Pulse: 75 66 (!) 52 60  Resp: 18 16 16 17   Temp: 98.1 F (36.7 C) 98.2 F (36.8 C) 97.7 F (36.5 C) 98.4 F (36.9 C)  TempSrc: Oral   Oral  SpO2: 92% 90% 97% 96%  Weight:      Height:       Clear to auscultation with basilar crackles. S1-S2 present. Bowel sound  present. No edema.  Filed Weights   06/29/24 1510  Weight: 90.7 kg   Condition at discharge: stable  The results of significant diagnostics from this hospitalization (including imaging, microbiology, ancillary and laboratory) are listed below for reference.   Imaging Studies: CT T-SPINE NO CHARGE Result Date: 06/29/2024 CLINICAL DATA:  Recent fall while on blood thinners with known rib  fractures on the left and back pain, initial encounter EXAM: CT THORACIC SPINE WITHOUT CONTRAST TECHNIQUE: Multidetector CT images of the thoracic were obtained using the standard protocol without intravenous contrast. RADIATION DOSE REDUCTION: This exam was performed according to the departmental dose-optimization program which includes automated exposure control, adjustment of the mA and/or kV according to patient size and/or use of iterative reconstruction technique. COMPARISON:  CT from earlier in the same day, 04/04/2024 CT FINDINGS: Alignment: Within normal limits. Vertebrae: Vertebral body height is well maintained. Mild osteophytic changes are seen. No pedicle abnormality or paraspinal mass lesion is seen. Paraspinal and other soft tissues: Surrounding soft tissue structures show no acute abnormality. Multiple rib fractures are seen on the left similar to that noted on prior CT examination. Again no pneumothorax is seen. Disc levels: No significant disc pathology is identified. No significant central canal stenosis is noted. IMPRESSION: Degenerative changes of the thoracic spine without acute abnormality. Multiple left-sided rib fractures similar to that noted on prior CT examination. No pneumothorax is noted. Electronically Signed   By: Oneil Devonshire M.D.   On: 06/29/2024 20:52   CT CHEST ABDOMEN PELVIS W CONTRAST Result Date: 06/29/2024 CLINICAL DATA:  Recent fall while on blood thinners with chest and abdominal pain, initial encounter EXAM: CT CHEST, ABDOMEN, AND PELVIS WITH CONTRAST TECHNIQUE: Multidetector  CT imaging of the chest, abdomen and pelvis was performed following the standard protocol during bolus administration of intravenous contrast. RADIATION DOSE REDUCTION: This exam was performed according to the departmental dose-optimization program which includes automated exposure control, adjustment of the mA and/or kV according to patient size and/or use of iterative reconstruction technique. CONTRAST:  75mL OMNIPAQUE  IOHEXOL  350 MG/ML SOLN COMPARISON:  04/04/2024 FINDINGS: CT CHEST FINDINGS Cardiovascular: Atherosclerotic calcifications of the thoracic aorta are noted. No aneurysmal dilatation is seen. The heart is not significantly enlarged. The pulmonary artery is well visualized within normal enhancement pattern. No evidence of pulmonary emboli is noted although not timed for embolus evaluation. Mild coronary calcifications are noted. Mediastinum/Nodes: Thoracic inlet is within normal limits. No hilar or mediastinal adenopathy is seen. Small hiatal hernia is noted. The esophagus is otherwise within normal limits. Lungs/Pleura: Lungs are well aerated bilaterally. No focal infiltrate or sizable effusion is seen. No pneumothorax is noted. Musculoskeletal: Mildly displaced fractures of the left seventh through tenth ribs are seen. Healing rib fractures on the right are noted. No acute right rib fractures are seen. No compression deformity is noted. CT ABDOMEN PELVIS FINDINGS Hepatobiliary: No focal liver abnormality is seen. Status post cholecystectomy. No biliary dilatation. Pancreas: Unremarkable. No pancreatic ductal dilatation or surrounding inflammatory changes. Spleen: Normal in size without focal abnormality. Adrenals/Urinary Tract: Adrenal glands are within normal limits. Kidneys demonstrate a normal enhancement pattern bilaterally. No renal calculi are noted. No obstructive changes are seen. The bladder is well distended. Stomach/Bowel: Scattered fecal material is noted throughout the colon. No  obstructive changes are noted. The appendix has been surgically removed. Small bowel and stomach appear within normal limits aside from the previously mentioned hiatal hernia. Vascular/Lymphatic: Aortic atherosclerosis. No enlarged abdominal or pelvic lymph nodes. Reproductive: Status post hysterectomy. No adnexal masses. Other: No abdominal wall hernia or abnormality. No abdominopelvic ascites. Musculoskeletal: No acute bony abnormality is noted. IMPRESSION: CT of the chest: Multiple bilateral rib fractures, acute on the left and subacute on the right. No pneumothorax is seen.  No other focal abnormality is noted. CT of the abdomen and pelvis: No acute abnormality is noted to correspond with  the given clinical history. Electronically Signed   By: Oneil Devonshire M.D.   On: 06/29/2024 19:34    Microbiology: Results for orders placed or performed during the hospital encounter of 06/29/24  Resp panel by RT-PCR (RSV, Flu A&B, Covid) Anterior Nasal Swab     Status: None   Collection Time: 06/29/24  3:46 PM   Specimen: Anterior Nasal Swab  Result Value Ref Range Status   SARS Coronavirus 2 by RT PCR NEGATIVE NEGATIVE Final   Influenza A by PCR NEGATIVE NEGATIVE Final   Influenza B by PCR NEGATIVE NEGATIVE Final    Comment: (NOTE) The Xpert Xpress SARS-CoV-2/FLU/RSV plus assay is intended as an aid in the diagnosis of influenza from Nasopharyngeal swab specimens and should not be used as a sole basis for treatment. Nasal washings and aspirates are unacceptable for Xpert Xpress SARS-CoV-2/FLU/RSV testing.  Fact Sheet for Patients: BloggerCourse.com  Fact Sheet for Healthcare Providers: SeriousBroker.it  This test is not yet approved or cleared by the [place]  FDA and has been authorized for detection and/or diagnosis of SARS-CoV-2 by FDA under an Emergency Use Authorization (EUA). This EUA will remain in effect (meaning this test can be used)  for the duration of the COVID-19 declaration under Section 564(b)(1) of the Act, 21 U.S.C. section 360bbb-3(b)(1), unless the authorization is terminated or revoked.     Resp Syncytial Virus by PCR NEGATIVE NEGATIVE Final    Comment: (NOTE) Fact Sheet for Patients: BloggerCourse.com  Fact Sheet for Healthcare Providers: SeriousBroker.it  This test is not yet approved or cleared by the [place]  FDA and has been authorized for detection and/or diagnosis of SARS-CoV-2 by FDA under an Emergency Use Authorization (EUA). This EUA will remain in effect (meaning this test can be used) for the duration of the COVID-19 declaration under Section 564(b)(1) of the Act, 21 U.S.C. section 360bbb-3(b)(1), unless the authorization is terminated or revoked.  Performed at [facility], [address], [place], [place] [address]    Labs: CBC: Recent Labs  Lab 07/04/24 0451 07/07/24 0540  WBC 6.0 4.6  HGB 11.1* 10.7*  HCT 34.0* 33.3*  MCV 96.6 97.7  PLT 168 194   Basic Metabolic Panel: Recent Labs  Lab 07/01/24 0714 07/02/24 1104 07/04/24 0451 07/07/24 0540  NA 140 139 141 142  K 3.8 4.0 4.3 3.7  CL 101 103 104 102  CO2 28 25 29 25   GLUCOSE 150* 163* 150* 114*  BUN 15 20 19 14   CREATININE 0.79 0.74 0.86 0.66  CALCIUM  8.9 8.6* 8.8* 8.7*  MG  --   --   --  1.8   Liver Function Tests: No results for input(s): AST, ALT, ALKPHOS, BILITOT, PROT, ALBUMIN in the last 168 hours. CBG: Recent Labs  Lab 07/06/24 1211 07/06/24 1710 07/06/24 1949 07/07/24 0801 07/07/24 1220  GLUCAP 119* 143* 215* 111* 131*    Discharge time spent: greater than 30 minutes.  Author: Yetta Blanch, MD  [facility]

## 2024-07-07 NOTE — Plan of Care (Signed)
  Problem: Education: Goal: Knowledge of General Education information will improve Description: Including pain rating scale, medication(s)/side effects and non-pharmacologic comfort measures Outcome: Adequate for Discharge   Problem: Health Behavior/Discharge Planning: Goal: Ability to manage health-related needs will improve Outcome: Adequate for Discharge   Problem: Clinical Measurements: Goal: Ability to maintain clinical measurements within normal limits will improve Outcome: Adequate for Discharge Goal: Will remain free from infection Outcome: Adequate for Discharge Goal: Diagnostic test results will improve Outcome: Adequate for Discharge Goal: Respiratory complications will improve Outcome: Adequate for Discharge Goal: Cardiovascular complication will be avoided Outcome: Adequate for Discharge   Problem: Activity: Goal: Risk for activity intolerance will decrease Outcome: Adequate for Discharge   Problem: Nutrition: Goal: Adequate nutrition will be maintained Outcome: Adequate for Discharge   Problem: Coping: Goal: Level of anxiety will decrease Outcome: Adequate for Discharge   Problem: Elimination: Goal: Will not experience complications related to bowel motility Outcome: Adequate for Discharge Goal: Will not experience complications related to urinary retention Outcome: Adequate for Discharge   Problem: Pain Managment: Goal: General experience of comfort will improve and/or be controlled Outcome: Adequate for Discharge   Problem: Safety: Goal: Ability to remain free from injury will improve Outcome: Adequate for Discharge   Problem: Skin Integrity: Goal: Risk for impaired skin integrity will decrease Outcome: Adequate for Discharge   Problem: Education: Goal: Ability to describe self-care measures that may prevent or decrease complications (Diabetes Survival Skills Education) will improve Outcome: Adequate for Discharge Goal: Individualized Educational  Video(s) Outcome: Adequate for Discharge   Problem: Coping: Goal: Ability to adjust to condition or change in health will improve Outcome: Adequate for Discharge   Problem: Fluid Volume: Goal: Ability to maintain a balanced intake and output will improve Outcome: Adequate for Discharge   Problem: Health Behavior/Discharge Planning: Goal: Ability to identify and utilize available resources and services will improve Outcome: Adequate for Discharge Goal: Ability to manage health-related needs will improve Outcome: Adequate for Discharge   Problem: Metabolic: Goal: Ability to maintain appropriate glucose levels will improve Outcome: Adequate for Discharge   Problem: Nutritional: Goal: Maintenance of adequate nutrition will improve Outcome: Adequate for Discharge Goal: Progress toward achieving an optimal weight will improve Outcome: Adequate for Discharge   Problem: Skin Integrity: Goal: Risk for impaired skin integrity will decrease Outcome: Adequate for Discharge   Problem: Tissue Perfusion: Goal: Adequacy of tissue perfusion will improve Outcome: Adequate for Discharge   Problem: Tissue Perfusion: Goal: Adequacy of tissue perfusion will improve Outcome: Adequate for Discharge

## 2024-07-08 DIAGNOSIS — I10 Essential (primary) hypertension: Secondary | ICD-10-CM | POA: Diagnosis not present

## 2024-07-08 DIAGNOSIS — S2249XD Multiple fractures of ribs, unspecified side, subsequent encounter for fracture with routine healing: Secondary | ICD-10-CM | POA: Diagnosis not present

## 2024-07-08 DIAGNOSIS — I509 Heart failure, unspecified: Secondary | ICD-10-CM | POA: Diagnosis not present

## 2024-07-08 DIAGNOSIS — G4733 Obstructive sleep apnea (adult) (pediatric): Secondary | ICD-10-CM | POA: Diagnosis not present

## 2024-07-08 DIAGNOSIS — R5381 Other malaise: Secondary | ICD-10-CM | POA: Diagnosis not present

## 2024-07-08 DIAGNOSIS — I4891 Unspecified atrial fibrillation: Secondary | ICD-10-CM | POA: Diagnosis not present

## 2024-07-08 DIAGNOSIS — E119 Type 2 diabetes mellitus without complications: Secondary | ICD-10-CM | POA: Diagnosis not present

## 2024-07-11 DIAGNOSIS — S2249XD Multiple fractures of ribs, unspecified side, subsequent encounter for fracture with routine healing: Secondary | ICD-10-CM | POA: Diagnosis not present

## 2024-07-11 DIAGNOSIS — I4891 Unspecified atrial fibrillation: Secondary | ICD-10-CM | POA: Diagnosis not present

## 2024-07-11 DIAGNOSIS — G4733 Obstructive sleep apnea (adult) (pediatric): Secondary | ICD-10-CM | POA: Diagnosis not present

## 2024-07-11 DIAGNOSIS — I509 Heart failure, unspecified: Secondary | ICD-10-CM | POA: Diagnosis not present

## 2024-07-11 DIAGNOSIS — E119 Type 2 diabetes mellitus without complications: Secondary | ICD-10-CM | POA: Diagnosis not present

## 2024-07-11 DIAGNOSIS — R5381 Other malaise: Secondary | ICD-10-CM | POA: Diagnosis not present

## 2024-07-11 DIAGNOSIS — I10 Essential (primary) hypertension: Secondary | ICD-10-CM | POA: Diagnosis not present

## 2024-07-12 DIAGNOSIS — F5104 Psychophysiologic insomnia: Secondary | ICD-10-CM | POA: Diagnosis not present

## 2024-07-12 DIAGNOSIS — I509 Heart failure, unspecified: Secondary | ICD-10-CM | POA: Diagnosis not present

## 2024-07-12 DIAGNOSIS — E119 Type 2 diabetes mellitus without complications: Secondary | ICD-10-CM | POA: Diagnosis not present

## 2024-07-12 DIAGNOSIS — I10 Essential (primary) hypertension: Secondary | ICD-10-CM | POA: Diagnosis not present

## 2024-07-12 DIAGNOSIS — S2249XD Multiple fractures of ribs, unspecified side, subsequent encounter for fracture with routine healing: Secondary | ICD-10-CM | POA: Diagnosis not present

## 2024-07-12 DIAGNOSIS — I4891 Unspecified atrial fibrillation: Secondary | ICD-10-CM | POA: Diagnosis not present

## 2024-07-12 DIAGNOSIS — R5381 Other malaise: Secondary | ICD-10-CM | POA: Diagnosis not present

## 2024-07-13 DIAGNOSIS — E119 Type 2 diabetes mellitus without complications: Secondary | ICD-10-CM | POA: Diagnosis not present

## 2024-07-13 DIAGNOSIS — I10 Essential (primary) hypertension: Secondary | ICD-10-CM | POA: Diagnosis not present

## 2024-07-13 DIAGNOSIS — I4891 Unspecified atrial fibrillation: Secondary | ICD-10-CM | POA: Diagnosis not present

## 2024-07-13 DIAGNOSIS — S2249XD Multiple fractures of ribs, unspecified side, subsequent encounter for fracture with routine healing: Secondary | ICD-10-CM | POA: Diagnosis not present

## 2024-07-15 ENCOUNTER — Encounter (HOSPITAL_COMMUNITY): Payer: Self-pay

## 2024-07-15 ENCOUNTER — Ambulatory Visit (HOSPITAL_COMMUNITY): Admit: 2024-07-15 | Admitting: Gastroenterology

## 2024-07-15 DIAGNOSIS — E119 Type 2 diabetes mellitus without complications: Secondary | ICD-10-CM | POA: Diagnosis not present

## 2024-07-15 DIAGNOSIS — I10 Essential (primary) hypertension: Secondary | ICD-10-CM | POA: Diagnosis not present

## 2024-07-15 DIAGNOSIS — I509 Heart failure, unspecified: Secondary | ICD-10-CM | POA: Diagnosis not present

## 2024-07-15 DIAGNOSIS — I4891 Unspecified atrial fibrillation: Secondary | ICD-10-CM | POA: Diagnosis not present

## 2024-07-15 DIAGNOSIS — G4733 Obstructive sleep apnea (adult) (pediatric): Secondary | ICD-10-CM | POA: Diagnosis not present

## 2024-07-15 DIAGNOSIS — R5381 Other malaise: Secondary | ICD-10-CM | POA: Diagnosis not present

## 2024-07-15 DIAGNOSIS — S2249XD Multiple fractures of ribs, unspecified side, subsequent encounter for fracture with routine healing: Secondary | ICD-10-CM | POA: Diagnosis not present

## 2024-07-15 SURGERY — EGD (ESOPHAGOGASTRODUODENOSCOPY)
Anesthesia: Monitor Anesthesia Care

## 2024-07-18 DIAGNOSIS — E119 Type 2 diabetes mellitus without complications: Secondary | ICD-10-CM | POA: Diagnosis not present

## 2024-07-18 DIAGNOSIS — R5381 Other malaise: Secondary | ICD-10-CM | POA: Diagnosis not present

## 2024-07-18 DIAGNOSIS — S2249XD Multiple fractures of ribs, unspecified side, subsequent encounter for fracture with routine healing: Secondary | ICD-10-CM | POA: Diagnosis not present

## 2024-07-18 DIAGNOSIS — I4891 Unspecified atrial fibrillation: Secondary | ICD-10-CM | POA: Diagnosis not present

## 2024-07-18 DIAGNOSIS — G4733 Obstructive sleep apnea (adult) (pediatric): Secondary | ICD-10-CM | POA: Diagnosis not present

## 2024-07-18 DIAGNOSIS — I10 Essential (primary) hypertension: Secondary | ICD-10-CM | POA: Diagnosis not present

## 2024-07-18 DIAGNOSIS — I509 Heart failure, unspecified: Secondary | ICD-10-CM | POA: Diagnosis not present

## 2024-07-19 DIAGNOSIS — I10 Essential (primary) hypertension: Secondary | ICD-10-CM | POA: Diagnosis not present

## 2024-07-19 DIAGNOSIS — E119 Type 2 diabetes mellitus without complications: Secondary | ICD-10-CM | POA: Diagnosis not present

## 2024-07-19 DIAGNOSIS — I509 Heart failure, unspecified: Secondary | ICD-10-CM | POA: Diagnosis not present

## 2024-07-19 DIAGNOSIS — I4891 Unspecified atrial fibrillation: Secondary | ICD-10-CM | POA: Diagnosis not present

## 2024-07-19 DIAGNOSIS — R5381 Other malaise: Secondary | ICD-10-CM | POA: Diagnosis not present

## 2024-07-19 DIAGNOSIS — S2249XD Multiple fractures of ribs, unspecified side, subsequent encounter for fracture with routine healing: Secondary | ICD-10-CM | POA: Diagnosis not present

## 2024-07-21 ENCOUNTER — Other Ambulatory Visit: Payer: Self-pay | Admitting: Family Medicine

## 2024-07-21 DIAGNOSIS — S2249XD Multiple fractures of ribs, unspecified side, subsequent encounter for fracture with routine healing: Secondary | ICD-10-CM | POA: Diagnosis not present

## 2024-07-21 DIAGNOSIS — G4733 Obstructive sleep apnea (adult) (pediatric): Secondary | ICD-10-CM | POA: Diagnosis not present

## 2024-07-21 DIAGNOSIS — R5381 Other malaise: Secondary | ICD-10-CM | POA: Diagnosis not present

## 2024-07-21 DIAGNOSIS — I509 Heart failure, unspecified: Secondary | ICD-10-CM | POA: Diagnosis not present

## 2024-07-21 DIAGNOSIS — I4891 Unspecified atrial fibrillation: Secondary | ICD-10-CM | POA: Diagnosis not present

## 2024-07-21 DIAGNOSIS — E119 Type 2 diabetes mellitus without complications: Secondary | ICD-10-CM | POA: Diagnosis not present

## 2024-07-21 DIAGNOSIS — I10 Essential (primary) hypertension: Secondary | ICD-10-CM | POA: Diagnosis not present

## 2024-07-22 DIAGNOSIS — I509 Heart failure, unspecified: Secondary | ICD-10-CM | POA: Diagnosis not present

## 2024-07-22 DIAGNOSIS — R5381 Other malaise: Secondary | ICD-10-CM | POA: Diagnosis not present

## 2024-07-22 DIAGNOSIS — G4733 Obstructive sleep apnea (adult) (pediatric): Secondary | ICD-10-CM | POA: Diagnosis not present

## 2024-07-22 DIAGNOSIS — E119 Type 2 diabetes mellitus without complications: Secondary | ICD-10-CM | POA: Diagnosis not present

## 2024-07-22 DIAGNOSIS — S2249XD Multiple fractures of ribs, unspecified side, subsequent encounter for fracture with routine healing: Secondary | ICD-10-CM | POA: Diagnosis not present

## 2024-07-22 DIAGNOSIS — I4891 Unspecified atrial fibrillation: Secondary | ICD-10-CM | POA: Diagnosis not present

## 2024-07-22 DIAGNOSIS — I10 Essential (primary) hypertension: Secondary | ICD-10-CM | POA: Diagnosis not present

## 2024-07-25 DIAGNOSIS — I4891 Unspecified atrial fibrillation: Secondary | ICD-10-CM | POA: Diagnosis not present

## 2024-07-25 DIAGNOSIS — G4733 Obstructive sleep apnea (adult) (pediatric): Secondary | ICD-10-CM | POA: Diagnosis not present

## 2024-07-25 DIAGNOSIS — I509 Heart failure, unspecified: Secondary | ICD-10-CM | POA: Diagnosis not present

## 2024-07-25 DIAGNOSIS — E119 Type 2 diabetes mellitus without complications: Secondary | ICD-10-CM | POA: Diagnosis not present

## 2024-07-25 DIAGNOSIS — R5381 Other malaise: Secondary | ICD-10-CM | POA: Diagnosis not present

## 2024-07-25 DIAGNOSIS — I10 Essential (primary) hypertension: Secondary | ICD-10-CM | POA: Diagnosis not present

## 2024-07-25 DIAGNOSIS — S2249XD Multiple fractures of ribs, unspecified side, subsequent encounter for fracture with routine healing: Secondary | ICD-10-CM | POA: Diagnosis not present

## 2024-07-28 DIAGNOSIS — S2249XD Multiple fractures of ribs, unspecified side, subsequent encounter for fracture with routine healing: Secondary | ICD-10-CM | POA: Diagnosis not present

## 2024-07-28 DIAGNOSIS — R5381 Other malaise: Secondary | ICD-10-CM | POA: Diagnosis not present

## 2024-07-28 DIAGNOSIS — G4733 Obstructive sleep apnea (adult) (pediatric): Secondary | ICD-10-CM | POA: Diagnosis not present

## 2024-07-28 DIAGNOSIS — I10 Essential (primary) hypertension: Secondary | ICD-10-CM | POA: Diagnosis not present

## 2024-07-28 DIAGNOSIS — E119 Type 2 diabetes mellitus without complications: Secondary | ICD-10-CM | POA: Diagnosis not present

## 2024-07-28 DIAGNOSIS — I4891 Unspecified atrial fibrillation: Secondary | ICD-10-CM | POA: Diagnosis not present

## 2024-07-28 DIAGNOSIS — I509 Heart failure, unspecified: Secondary | ICD-10-CM | POA: Diagnosis not present

## 2024-07-29 DIAGNOSIS — S2249XD Multiple fractures of ribs, unspecified side, subsequent encounter for fracture with routine healing: Secondary | ICD-10-CM | POA: Diagnosis not present

## 2024-07-29 DIAGNOSIS — G4733 Obstructive sleep apnea (adult) (pediatric): Secondary | ICD-10-CM | POA: Diagnosis not present

## 2024-07-29 DIAGNOSIS — R5381 Other malaise: Secondary | ICD-10-CM | POA: Diagnosis not present

## 2024-07-29 DIAGNOSIS — I4891 Unspecified atrial fibrillation: Secondary | ICD-10-CM | POA: Diagnosis not present

## 2024-07-29 DIAGNOSIS — E119 Type 2 diabetes mellitus without complications: Secondary | ICD-10-CM | POA: Diagnosis not present

## 2024-07-29 DIAGNOSIS — I10 Essential (primary) hypertension: Secondary | ICD-10-CM | POA: Diagnosis not present

## 2024-07-29 DIAGNOSIS — I509 Heart failure, unspecified: Secondary | ICD-10-CM | POA: Diagnosis not present

## 2024-08-01 ENCOUNTER — Telehealth: Payer: Self-pay

## 2024-08-01 DIAGNOSIS — Z6836 Body mass index (BMI) 36.0-36.9, adult: Secondary | ICD-10-CM | POA: Diagnosis not present

## 2024-08-01 DIAGNOSIS — Z79891 Long term (current) use of opiate analgesic: Secondary | ICD-10-CM | POA: Diagnosis not present

## 2024-08-01 DIAGNOSIS — S2242XD Multiple fractures of ribs, left side, subsequent encounter for fracture with routine healing: Secondary | ICD-10-CM | POA: Diagnosis not present

## 2024-08-01 DIAGNOSIS — Z7984 Long term (current) use of oral hypoglycemic drugs: Secondary | ICD-10-CM | POA: Diagnosis not present

## 2024-08-01 DIAGNOSIS — E1140 Type 2 diabetes mellitus with diabetic neuropathy, unspecified: Secondary | ICD-10-CM | POA: Diagnosis not present

## 2024-08-01 DIAGNOSIS — C50211 Malignant neoplasm of upper-inner quadrant of right female breast: Secondary | ICD-10-CM | POA: Diagnosis not present

## 2024-08-01 DIAGNOSIS — E66811 Obesity, class 1: Secondary | ICD-10-CM | POA: Diagnosis not present

## 2024-08-01 DIAGNOSIS — K5900 Constipation, unspecified: Secondary | ICD-10-CM | POA: Diagnosis not present

## 2024-08-01 DIAGNOSIS — I110 Hypertensive heart disease with heart failure: Secondary | ICD-10-CM | POA: Diagnosis not present

## 2024-08-01 DIAGNOSIS — M810 Age-related osteoporosis without current pathological fracture: Secondary | ICD-10-CM | POA: Diagnosis not present

## 2024-08-01 DIAGNOSIS — D6489 Other specified anemias: Secondary | ICD-10-CM | POA: Diagnosis not present

## 2024-08-01 DIAGNOSIS — Z170 Estrogen receptor positive status [ER+]: Secondary | ICD-10-CM | POA: Diagnosis not present

## 2024-08-01 DIAGNOSIS — I4819 Other persistent atrial fibrillation: Secondary | ICD-10-CM | POA: Diagnosis not present

## 2024-08-01 DIAGNOSIS — F32A Depression, unspecified: Secondary | ICD-10-CM | POA: Diagnosis not present

## 2024-08-01 DIAGNOSIS — Z9981 Dependence on supplemental oxygen: Secondary | ICD-10-CM | POA: Diagnosis not present

## 2024-08-01 DIAGNOSIS — I5032 Chronic diastolic (congestive) heart failure: Secondary | ICD-10-CM | POA: Diagnosis not present

## 2024-08-01 DIAGNOSIS — Z7901 Long term (current) use of anticoagulants: Secondary | ICD-10-CM | POA: Diagnosis not present

## 2024-08-01 DIAGNOSIS — Z9181 History of falling: Secondary | ICD-10-CM | POA: Diagnosis not present

## 2024-08-01 DIAGNOSIS — J45909 Unspecified asthma, uncomplicated: Secondary | ICD-10-CM | POA: Diagnosis not present

## 2024-08-01 NOTE — Transitions of Care (Post Inpatient/ED Visit) (Unsigned)
   08/01/2024  Name: Kayla Bennett MRN: [id number] DOB: 08-25-1951  Today's TOC FU Call Status: Today's TOC FU Call Status:: Unsuccessful Call (1st Attempt) Unsuccessful Call (1st Attempt) Date: 08/01/24  Attempted to reach the patient regarding the most recent Inpatient/ED visit.  Follow Up Plan: Additional outreach attempts will be made to reach the patient to complete the Transitions of Care (Post Inpatient/ED visit) call.   Signature Julian Lemmings, LPN [facility] Nurse Health Advisor Direct Dial [phone]

## 2024-08-01 NOTE — Telephone Encounter (Signed)
 Copied from CRM [id number]. Topic: Clinical - Home Health Verbal Orders >> Aug 01, 2024 12:44 PM Cherylann RAMAN wrote: Caller/Agency: [facility] Callback Number: [phone] Vm is confidential may leave detailed msg Service Requested: Physical Therapy Frequency: 2 Week 3 1 week 3  Any new concerns about the patient? No

## 2024-08-02 ENCOUNTER — Other Ambulatory Visit: Payer: Self-pay | Admitting: Cardiovascular Disease

## 2024-08-02 NOTE — Telephone Encounter (Signed)
 PT with [facility] given verbal order approvals.

## 2024-08-02 NOTE — Transitions of Care (Post Inpatient/ED Visit) (Signed)
 08/02/2024  Name: Kayla Bennett MRN: [id number] DOB: [birth year]  Today's TOC FU Call Status: Today's TOC FU Call Status:: Successful TOC FU Call Completed Unsuccessful Call (1st Attempt) Date: 08/01/24 [facility] FU Call Complete Date: 08/02/24 Patient's Name and Date of Birth confirmed.  Transition Care Management Follow-up Telephone Call Date of Discharge: 07/29/24 Discharge Facility: Other [facility]) Name of Other ([facility]) Discharge Facility: [facility] Type of Discharge: Inpatient Admission Primary Inpatient Discharge Diagnosis:: fall How have you been since you were released from the hospital?: Better Any questions or concerns?: No  Items Reviewed: Did you receive and understand the discharge instructions provided?: Yes Medications obtained,verified, and reconciled?: Yes (Medications Reviewed) Any new allergies since your discharge?: No Dietary orders reviewed?: Yes Do you have support at home?: Yes People in Home [RPT]: spouse  Medications Reviewed Today: Medications Reviewed Today     Reviewed by Emmitt Pan, LPN (Licensed Practical Nurse) on 08/02/24 at 1107  Med List Status: <None>   Medication Order Taking? Sig Documenting Provider Last Dose Status Informant  acetaminophen  (TYLENOL ) 500 MG tablet [id number] Yes Take 2 tablets (1,000 mg total) by mouth every 8 (eight) hours. Tobie Yetta HERO, MD  Active   albuterol  (VENTOLIN  HFA) [phone] Base) MCG/ACT inhaler [id number] Yes Inhale 2 puffs into the lungs every 6 (six) hours as needed for wheezing or shortness of breath. Alvia Bring, DO  Active Self, Pharmacy Records  bisacodyl  (DULCOLAX) 10 MG suppository [id number] Yes Place 1 suppository (10 mg total) rectally daily as needed for moderate constipation. Patel, Pranav M, MD  Active   Calcium  Carbonate (CALCIUM  600 PO) [id number] Yes Take 600 mg by mouth in the morning. [provider]  Active Self, Pharmacy Records  cetirizine (ZYRTEC) 10 MG tablet  [id number] Yes Take 10 mg by mouth in the morning. [provider]  Active Self, Pharmacy Records  cholecalciferol  (VITAMIN D3) 25 MCG (1000 UNIT) tablet [id number] Yes Take 1,000 Units by mouth in the morning and at bedtime. [provider]  Active Self, Pharmacy Records  cyclobenzaprine  (FLEXERIL ) 10 MG tablet [id number] Yes Take 1 tablet (10 mg total) by mouth 3 (three) times daily. Tobie Yetta HERO, MD  Active   diclofenac  Sodium (VOLTAREN ) 1 % GEL [id number] Yes Apply 2 g topically 4 (four) times daily. Tobie Yetta HERO, MD  Active   ELIQUIS  5 MG TABS tablet [id number] Yes TAKE ONE TABLET BY MOUTH TWICE DAILY Tobb, Kardie, DO  Active Self, Pharmacy Records           Med Note EFRAIM ALFREIDA LITTIE Stevan Jun 29, 2024  8:32 PM) LF: 06/13/24 for a 30ds  Evolocumab  (REPATHA  SURECLICK) 140 MG/ML SOAJ [id number] Yes Inject 140 mg into the skin every 14 (fourteen) days. Tobb, Kardie, DO  Active Self, Pharmacy Records           Med Note EFRAIM ALFREIDA LITTIE Stevan Jun 29, 2024  8:32 PM) LF: 06/13/24 for a 28ds  ferrous sulfate  325 (65 FE) MG tablet [id number] Yes Take 325 mg by mouth every evening. [provider]  Active Self, Pharmacy Records  furosemide  (LASIX ) 40 MG tablet [id number] Yes Take 1 tablet (40 mg total) by mouth daily.  Patient taking differently: Take 40 mg by mouth in the morning.   Verlin Lonni BIRCH, MD  Active Self, Pharmacy Records           Med Note EFRAIM ALFREIDA LITTIE Stevan Jun 29, 2024  8:34 PM) LF: 05/14/24  for a 90ds  gabapentin  (NEURONTIN ) 300 MG capsule [id number] Yes Take 1 capsule (300 mg total) by mouth 3 (three) times daily. Patel, Pranav M, MD  Active   hydrocortisone 2.5 % cream [id number] Yes Apply 1 Application topically 2 (two) times daily. [provider]  Active Self, Pharmacy Records           Med Note EFRAIM ALFREIDA CROME   Wed Jun 29, 2024  8:33 PM) LF: 06/10/24 for a 86ds  lidocaine  (LIDODERM ) 5 % [id number] Yes Place 1 patch  onto the skin daily. Remove & Discard patch within 12 hours or as directed by MD Tobie Yetta HERO, MD  Active   losartan  (COZAAR ) 100 MG tablet [id number] Yes Take 1 tablet (100 mg total) by mouth daily.  Patient taking differently: Take 100 mg by mouth in the morning.   Verlin Lonni BIRCH, MD  Active Self, Pharmacy Records           Med Note EFRAIM, ALFREIDA CROME   Wed Jun 29, 2024  8:34 PM) LF: 05/14/24 for a 90ds  metFORMIN  (GLUCOPHAGE -XR) 500 MG 24 hr tablet [id number] Yes TAKE ONE TABLET BY MOUTH EVERY DAY  Patient taking differently: Take 500 mg by mouth at bedtime.   Alvia Bring, DO  Active Self, Pharmacy Records           Med Note EFRAIM ALFREIDA CROME Stevan Jun 29, 2024  8:33 PM) LF: 05/19/24 for a 90ds  metoprolol  succinate (TOPROL -XL) 100 MG 24 hr tablet [id number] Yes Take 1 tablet (100 mg total) by mouth daily. Take with or immediately following a meal.  Patient taking differently: Take 100 mg by mouth in the morning. Take with or immediately following a meal.   Alvia Bring, DO  Active Self, Pharmacy Records           Med Note EFRAIM ALFREIDA CROME Stevan Jun 29, 2024  8:35 PM) LF: 04/07/24 for a 90ds  nystatin cream (MYCOSTATIN) [id number] Yes Apply 1 Application topically as needed for dry skin. [provider]  Active Self, Pharmacy Records  oxyCODONE  (OXY IR/ROXICODONE ) 5 MG immediate release tablet [id number] Yes Take 1-2 tablets (5-10 mg total) by mouth every 6 (six) hours as needed for moderate pain (pain score 4-6) or severe pain (pain score 7-10). Tobie Yetta HERO, MD  Active   pantoprazole  (PROTONIX ) 40 MG tablet [id number] Yes TAKE ONE TABLET BY MOUTH TWICE DAILY Alvia Bring, DO  Active Self, Pharmacy Records           Med Note EFRAIM ALFREIDA CROME Stevan Jun 29, 2024  8:33 PM) LF: 05/30/24 for a 90ds  polyethylene glycol (MIRALAX  / GLYCOLAX ) 17 g packet [id number] Yes Take 17 g by mouth daily. Tobie Yetta HERO, MD  Active   potassium chloride  SA (KLOR-CON  M)  20 MEQ tablet [id number] Yes Take 1 tablet (20 mEq total) by mouth daily.  Patient taking differently: Take 20 mEq by mouth in the morning.   Alvia Bring, DO  Active Self, Pharmacy Records           Med Note EFRAIM ALFREIDA CROME Stevan Jun 29, 2024  8:36 PM) LF: 04/07/24 for a 90ds  Prenatal Vit-Fe Fumarate-FA (PRENATAL VITAMIN PLUS LOW IRON PO) [id number] Yes Take 1 tablet by mouth in the morning. [provider]  Active Self, Pharmacy Records  simethicone  [facility]) 80 MG chewable tablet [id number] Yes Chew 1 tablet (80 mg total) by mouth every 6 (six)  hours as needed for flatulence. Tobie Yetta HERO, MD  Active   tamoxifen  (NOLVADEX ) 20 MG tablet [id number] Yes Take 1 tablet (20 mg total) by mouth every morning. Burton, [name], NP  Active Self, Pharmacy Records           Med Note EFRAIM ALFREIDA LITTIE Stevan Jun 29, 2024  8:34 PM) LF: 05/18/24 for a 90ds   triamcinolone (KENALOG) 0.1 % [id number] Yes Apply 1 application  topically 2 (two) times daily as needed (psoriasis). Mixed with Cetaphil 1:1 [provider]  Active Self, Pharmacy Records  UNABLE TO LARA [id number] Yes OUTPATIENT PHYSICAL THERAPY    Diagnosis: Generalized debility due to UTI, acute gastroenteritis Rai, Ripudeep K, MD  Active Self, Pharmacy Records  venlafaxine  XR (EFFEXOR -XR) 37.5 MG 24 hr capsule [id number] Yes Take 1 capsule (37.5 mg total) by mouth at bedtime. Alvia Bring, DO  Active   venlafaxine  XR (EFFEXOR -XR) 75 MG 24 hr capsule [id number] Yes TAKE ONE CAPSULE BY MOUTH EVERY DAY  Patient taking differently: Take 75 mg by mouth every evening. Take 1 tablet by mouth with 1 tablet of 37.5mg  Venlafaxine  for a combined dose of 112.5mg  Venlafaxine .   Alvia Bring, DO  Active Self, Pharmacy Records           Med Note EFRAIM ALFREIDA LITTIE Stevan Jun 29, 2024  8:35 PM) LF: 05/14/24 for a 90ds  vitamin C (ASCORBIC ACID) 500 MG tablet [id number] Yes Take 500 mg by mouth every evening. [provider]   Active Self, Pharmacy Records           Med Note STEPHEN, [facility]   Wed Jul 10, 2021  9:42 AM)              Home Care and Equipment/Supplies: Were Home Health Services Ordered?: Yes [facility] of Home Health Agency:: [facility] Has Agency set up a time to come to your home?: Yes First Home Health Visit Date: 08/01/24 Any new equipment or medical supplies ordered?: NA  Functional Questionnaire: Do you need assistance with bathing/showering or dressing?: No Do you need assistance with meal preparation?: No Do you need assistance with eating?: No Do you have difficulty maintaining continence: No Do you need assistance with getting out of bed/getting out of a chair/moving?: No Do you have difficulty managing or taking your medications?: No  Follow up appointments reviewed: PCP Follow-up appointment confirmed?: Yes Date of PCP follow-up appointment?: 08/08/24 Follow-up Provider: [facility] Follow-up appointment confirmed?: NA Do you need transportation to your follow-up appointment?: No Do you understand care options if your condition(s) worsen?: Yes-patient verbalized understanding    SIGNATURE Julian Lemmings, LPN [facility] Nurse Health Advisor Direct Dial [phone]

## 2024-08-03 DIAGNOSIS — I5032 Chronic diastolic (congestive) heart failure: Secondary | ICD-10-CM | POA: Diagnosis not present

## 2024-08-03 DIAGNOSIS — E1140 Type 2 diabetes mellitus with diabetic neuropathy, unspecified: Secondary | ICD-10-CM | POA: Diagnosis not present

## 2024-08-03 DIAGNOSIS — J45909 Unspecified asthma, uncomplicated: Secondary | ICD-10-CM | POA: Diagnosis not present

## 2024-08-03 DIAGNOSIS — C50211 Malignant neoplasm of upper-inner quadrant of right female breast: Secondary | ICD-10-CM | POA: Diagnosis not present

## 2024-08-03 DIAGNOSIS — I4819 Other persistent atrial fibrillation: Secondary | ICD-10-CM | POA: Diagnosis not present

## 2024-08-03 DIAGNOSIS — I110 Hypertensive heart disease with heart failure: Secondary | ICD-10-CM | POA: Diagnosis not present

## 2024-08-03 MED ORDER — LOSARTAN POTASSIUM 100 MG PO TABS: 100.0000 mg | ORAL_TABLET | Freq: Every day | ORAL | 1 refills | Status: AC

## 2024-08-03 MED ORDER — FUROSEMIDE 40 MG PO TABS: 40.0000 mg | ORAL_TABLET | Freq: Every day | ORAL | 1 refills | Status: AC

## 2024-08-04 DIAGNOSIS — E1140 Type 2 diabetes mellitus with diabetic neuropathy, unspecified: Secondary | ICD-10-CM | POA: Diagnosis not present

## 2024-08-04 DIAGNOSIS — J45909 Unspecified asthma, uncomplicated: Secondary | ICD-10-CM | POA: Diagnosis not present

## 2024-08-04 DIAGNOSIS — I110 Hypertensive heart disease with heart failure: Secondary | ICD-10-CM | POA: Diagnosis not present

## 2024-08-04 DIAGNOSIS — I4819 Other persistent atrial fibrillation: Secondary | ICD-10-CM | POA: Diagnosis not present

## 2024-08-04 DIAGNOSIS — C50211 Malignant neoplasm of upper-inner quadrant of right female breast: Secondary | ICD-10-CM | POA: Diagnosis not present

## 2024-08-04 DIAGNOSIS — I5032 Chronic diastolic (congestive) heart failure: Secondary | ICD-10-CM | POA: Diagnosis not present

## 2024-08-07 ENCOUNTER — Other Ambulatory Visit: Payer: Self-pay | Admitting: Family Medicine

## 2024-08-07 DIAGNOSIS — E119 Type 2 diabetes mellitus without complications: Secondary | ICD-10-CM

## 2024-08-08 ENCOUNTER — Encounter: Payer: Self-pay | Admitting: Family Medicine

## 2024-08-08 ENCOUNTER — Ambulatory Visit (INDEPENDENT_AMBULATORY_CARE_PROVIDER_SITE_OTHER): Admitting: Family Medicine

## 2024-08-08 VITALS — BP 141/80 | HR 70 | Ht 62.0 in | Wt 203.0 lb

## 2024-08-08 DIAGNOSIS — E1140 Type 2 diabetes mellitus with diabetic neuropathy, unspecified: Secondary | ICD-10-CM | POA: Diagnosis not present

## 2024-08-08 DIAGNOSIS — I4819 Other persistent atrial fibrillation: Secondary | ICD-10-CM

## 2024-08-08 DIAGNOSIS — S2242XA Multiple fractures of ribs, left side, initial encounter for closed fracture: Secondary | ICD-10-CM

## 2024-08-08 DIAGNOSIS — I10 Essential (primary) hypertension: Secondary | ICD-10-CM

## 2024-08-08 DIAGNOSIS — E1169 Type 2 diabetes mellitus with other specified complication: Secondary | ICD-10-CM | POA: Diagnosis not present

## 2024-08-08 DIAGNOSIS — J45909 Unspecified asthma, uncomplicated: Secondary | ICD-10-CM | POA: Diagnosis not present

## 2024-08-08 DIAGNOSIS — D6869 Other thrombophilia: Secondary | ICD-10-CM

## 2024-08-08 DIAGNOSIS — I5032 Chronic diastolic (congestive) heart failure: Secondary | ICD-10-CM | POA: Diagnosis not present

## 2024-08-08 DIAGNOSIS — C50211 Malignant neoplasm of upper-inner quadrant of right female breast: Secondary | ICD-10-CM | POA: Diagnosis not present

## 2024-08-08 DIAGNOSIS — E669 Obesity, unspecified: Secondary | ICD-10-CM

## 2024-08-08 DIAGNOSIS — Z7984 Long term (current) use of oral hypoglycemic drugs: Secondary | ICD-10-CM

## 2024-08-08 DIAGNOSIS — I110 Hypertensive heart disease with heart failure: Secondary | ICD-10-CM | POA: Diagnosis not present

## 2024-08-08 MED ORDER — GABAPENTIN 300 MG PO CAPS: 300.0000 mg | ORAL_CAPSULE | Freq: Three times a day (TID) | ORAL | 5 refills | Status: AC

## 2024-08-08 NOTE — Assessment & Plan Note (Signed)
 Rate is better controlled with increase in metoprolol.  BP remains stable.  She will continue to see cardiology.  Remains anticoagulated with eliquis.

## 2024-08-08 NOTE — Assessment & Plan Note (Addendum)
 BP is well controlled.  Continue losartan  and metoprolol  at current strength.

## 2024-08-08 NOTE — Assessment & Plan Note (Signed)
 She remains on eliquis and is tolerating well

## 2024-08-08 NOTE — Progress Notes (Signed)
 Kayla Bennett - 73 y.o. female MRN [id number]  Date of birth: [birth year]  Subjective No chief complaint on file.   HPI Kayla Bennett is a 73 y.o. female here today for follow up.   She unfortunately suffered a fall a little over one month ago.  Seen in the ED and noted to have multiple rib fractures on the L side.  Trauma surgery consulted.  Patient admitted for montoring.  Pain managed with tylenol .flexeril , gabapentin  and oxycodone  prn.  Abdominal binder applied as well.  PT/OT consulted and SNF rehab was reccommended.  Gabapentin  increased to TID in the SNF.  She is off of oxycodone .  Using flexeril  prn. F/u with PCP was also recommended to have recheck of electrolytes and cbc.  Feeling pretty good at this point.  Still with some pain.  She is starting HHPT this week.   She remained in normal sinus rhythm during her admission.  Continued on toprol  and eliquis .   Blood sugars have remained well controlled.  She remains on metformin .  Last A1c on 8/14 was 6.5%  ROS:  A comprehensive ROS was completed and negative except as noted per HPI  Allergies  Allergen Reactions   Lotensin [Benazepril Hcl] Swelling   Morphine Itching and Rash   Erythromycin Base Other (See Comments)    Unknown reaction   Hydrocodone  Other (See Comments)    Unknown reaction     Other Swelling    Mustard - vaginal swelling/pain    Banana Swelling    vaginal swelling/pain   Chocolate Swelling    vaginal swelling/pain   Gluten Meal Diarrhea and Other (See Comments)    Upset stomach    Tizanidine Other (See Comments)    Lowered blood pressure    Wound Dressing Adhesive Other (See Comments)    Skin burns, blisters     Past Medical History:  Diagnosis Date   Allergy 1978   Food allergies, seasonal allergies, medicine allergies   Anemia    Arthritis    Asthma    Blood transfusion without reported diagnosis 1985   Breast cancer (HCC)    Diabetes (HCC)    Dyspnea    Dysrhythmia    afibb on  occasion   Family history of adverse reaction to anesthesia    mother had reaction to versed ...during surgery, stopped breathing   Fibromyalgia    GERD (gastroesophageal reflux disease)    Headache    History of hiatal hernia    Hyperlipidemia 1990?   Hypertension    Migraines    Osteopenia    Osteoporosis    Oxygen  deficiency 2023   Psoriasis    Sleep apnea 2023   Yeast infection 10/30/2021    Past Surgical History:  Procedure Laterality Date   ABDOMINAL HYSTERECTOMY     APPENDECTOMY     ATRIAL FIBRILLATION ABLATION N/A 12/10/2021   Procedure: ATRIAL FIBRILLATION ABLATION;  Surgeon: Inocencio Soyla Lunger, MD;  Location: [facility] INVASIVE CV LAB;  Service: Cardiovascular;  Laterality: N/A;   BREAST LUMPECTOMY WITH RADIOACTIVE SEED AND SENTINEL LYMPH NODE BIOPSY Right 02/18/2021   Procedure: RIGHT BREAST LUMPECTOMY WITH RADIOACTIVE SEED AND SENTINEL LYMPH NODE BIOPSY;  Surgeon: Aron Shoulders, MD;  Location: [facility] OR;  Service: General;  Laterality: Right;   BREAST SURGERY  2022   broken wrist     CARDIOVERSION N/A 04/04/2021   Procedure: CARDIOVERSION;  Surgeon: Shlomo Wilbert SAUNDERS, MD;  Location: [facility] ENDOSCOPY;  Service: Cardiovascular;  Laterality: N/A;   CARDIOVERSION N/A 08/14/2021  Procedure: CARDIOVERSION;  Surgeon: Barbaraann Darryle Ned, MD;  Location: [facility] ENDOSCOPY;  Service: Cardiovascular;  Laterality: N/A;   CARDIOVERSION N/A 09/19/2021   Procedure: CARDIOVERSION;  Surgeon: Barbaraann Darryle Ned, MD;  Location: [facility] ENDOSCOPY;  Service: Cardiovascular;  Laterality: N/A;   CESAREAN SECTION  1976 and 1977   CHOLECYSTECTOMY     FRACTURE SURGERY     right wrist   HYSTERECTOMY ABDOMINAL WITH SALPINGO-OOPHORECTOMY     LYSIS OF ADHESION     RIGHT/LEFT HEART CATH AND CORONARY ANGIOGRAPHY N/A 02/18/2024   Procedure: RIGHT/LEFT HEART CATH AND CORONARY ANGIOGRAPHY;  Surgeon: Verlin Lonni BIRCH, MD;  Location: [facility] INVASIVE CV LAB;  Service: Cardiovascular;  Laterality: N/A;   SHOULDER  SURGERY Left    TUBAL LIGATION      Social History   Socioeconomic History   Marital status: Married    Spouse name: Not on file   Number of children: 2   Years of education: Not on file   Highest education level: 12th grade  Occupational History   Not on file  Tobacco Use   Smoking status: Never   Smokeless tobacco: Never  Vaping Use   Vaping status: Never Used  Substance and Sexual Activity   Alcohol use: Never   Drug use: Never   Sexual activity: Not Currently  Other Topics Concern   Not on file  Social History Narrative   Not on file   Social Drivers of Health   Financial Resource Strain: Low Risk  (08/05/2024)   Overall Financial Resource Strain (CARDIA)    Difficulty of Paying Living Expenses: Not hard at all  Food Insecurity: No Food Insecurity (08/05/2024)   Hunger Vital Sign    Worried About Running Out of Food in the Last Year: Never true    Ran Out of Food in the Last Year: Never true  Transportation Needs: No Transportation Needs (08/05/2024)   PRAPARE - Administrator, Civil Service (Medical): No    Lack of Transportation (Non-Medical): No  Physical Activity: Inactive (08/05/2024)   Exercise Vital Sign    Days of Exercise per Week: 0 days    Minutes of Exercise per Session: Not on file  Stress: No Stress Concern Present (08/05/2024)   [employer] of Occupational Health - Occupational Stress Questionnaire    Feeling of Stress: Not at all  Social Connections: Unknown (08/05/2024)   Social Connection and Isolation Panel    Frequency of Communication with Friends and Family: More than three times a week    Frequency of Social Gatherings with Friends and Family: Not on file    Attends Religious Services: Not on file    Active Member of Clubs or Organizations: No    Attends Banker Meetings: Not on file    Marital Status: Married  Recent Concern: Social Connections - Moderately Isolated (06/29/2024)   Social Connection and  Isolation Panel    Frequency of Communication with Friends and Family: More than three times a week    Frequency of Social Gatherings with Friends and Family: Once a week    Attends Religious Services: Never    Database administrator or Organizations: Patient unable to answer    Attends Banker Meetings: Never    Marital Status: Married    Family History  Problem Relation Age of Onset   Atrial fibrillation Mother    Heart attack Mother 38   Arthritis Mother    Cancer Mother    Diabetes Mother  Heart disease Mother    Hypertension Mother    Colon cancer Father 1   Heart attack Father 66   Cancer Father    Early death Father    Heart disease Father    Hypertension Father    Heart attack Paternal Grandmother 79   Cancer Maternal Uncle        melanoma    Colon cancer Paternal Uncle    Cancer Paternal Uncle    Diabetes Paternal Uncle    Heart disease Paternal Uncle    Obesity Paternal Uncle    Colon cancer Cousin    Atrial fibrillation Cousin    Stroke Maternal Grandmother    Stroke Paternal Grandfather    Arthritis Maternal Aunt    Diabetes Maternal Aunt    Hearing loss Maternal Aunt    Cancer Paternal Uncle    Heart disease Paternal Uncle    Obesity Paternal Uncle    Diabetes Maternal Aunt    Diabetes Maternal Aunt    Heart disease Maternal Aunt    Diabetes Maternal Uncle    Hearing loss Maternal Uncle    Diabetes Paternal Aunt    Hearing loss Paternal Aunt    Diabetes Sister    Hyperlipidemia Sister    Hypertension Sister    Diabetes Paternal Uncle    Hearing loss Paternal Uncle    Diabetes Paternal Uncle    Hearing loss Paternal Uncle    Heart disease Paternal Uncle    Hypertension Paternal Uncle    Diabetes Paternal Uncle    Stroke Maternal Uncle     Health Maintenance  Topic Date Due   Zoster Vaccines- Shingrix (1 of 2) Never done   OPHTHALMOLOGY EXAM  06/06/2023   DTaP/Tdap/Td (2 - Td or Tdap) 05/31/2024   Influenza Vaccine   06/17/2024   COVID-19 Vaccine (3 - 2025-26 season) 07/18/2024   Hepatitis C Screening  04/07/2025 (Originally 02/17/1969)   FOOT EXAM  10/08/2024   Mammogram  11/04/2024   Medicare Annual Wellness (AWV)  11/30/2024   HEMOGLOBIN A1C  12/31/2024   Diabetic kidney evaluation - Urine ACR  04/07/2025   Diabetic kidney evaluation - eGFR measurement  07/07/2025   Colonoscopy  04/19/2029   Pneumococcal Vaccine: 50+ Years  Completed   DEXA SCAN  Completed   HPV VACCINES  Aged Out   Meningococcal B Vaccine  Aged Out     ----------------------------------------------------------------------------------------------------------------------------------------------------------------------------------------------------------------- Physical Exam BP (!) 141/80 (BP Location: Left Arm, Patient Position: Sitting, Cuff Size: Large)   Pulse 70   Ht 5' 2 (1.575 m)   Wt 203 lb (92.1 kg)   SpO2 96%   BMI 37.13 kg/m    Physical Exam Constitutional:      Appearance: Normal appearance.  Eyes:     General: No scleral icterus. Cardiovascular:     Rate and Rhythm: Normal rate and regular rhythm.  Pulmonary:     Effort: Pulmonary effort is normal.     Breath sounds: Normal breath sounds.  Musculoskeletal:     Cervical back: Neck supple.  Neurological:     General: No focal deficit present.     Mental Status: She is alert.  Psychiatric:        Mood and Affect: Mood normal.        Behavior: Behavior normal.     ------------------------------------------------------------------------------------------------------------------------------------------------------------------------------------------------------------------- Assessment and Plan  Persistent atrial fibrillation (HCC) Rate is better controlled with increase in metoprolol .  BP remains stable.  She will continue to see cardiology.  Remains anticoagulated  with eliquis .   Secondary hypercoagulable state She remains on eliquis  and is  tolerating well  Type 2 diabetes mellitus with obesity (HCC) Diabetes remains very well-controlled.  She will continue on metformin .  Dietary changes encouraged.  Hypertension BP is well controlled.  Continue losartan  and metoprolol  at current strength.   Rib fractures Continue tylenol  with flexeril  and gabapentin  as needed.  Gabapentin  renewed.     Orders Placed This Encounter  Procedures   CMP14+EGFR   CBC with Differential/Platelet      No follow-ups on file.

## 2024-08-08 NOTE — Assessment & Plan Note (Signed)
 Continue tylenol  with flexeril  and gabapentin  as needed.  Gabapentin  renewed.

## 2024-08-08 NOTE — Assessment & Plan Note (Signed)
 Diabetes remains very well-controlled.  She will continue on metformin.  Dietary changes encouraged.

## 2024-08-09 LAB — CBC WITH DIFFERENTIAL/PLATELET
Basophils Absolute: 0 x10E3/uL (ref 0.0–0.2)
Basos: 1 %
EOS (ABSOLUTE): 0.2 x10E3/uL (ref 0.0–0.4)
Eos: 3 %
Hematocrit: 30.9 % — ABNORMAL LOW (ref 34.0–46.6)
Hemoglobin: 9.6 g/dL — ABNORMAL LOW (ref 11.1–15.9)
Immature Grans (Abs): 0 x10E3/uL (ref 0.0–0.1)
Immature Granulocytes: 0 %
Lymphocytes Absolute: 1.5 x10E3/uL (ref 0.7–3.1)
Lymphs: 34 %
MCH: 31 pg (ref 26.6–33.0)
MCHC: 31.1 g/dL — ABNORMAL LOW (ref 31.5–35.7)
MCV: 100 fL — ABNORMAL HIGH (ref 79–97)
Monocytes Absolute: 0.4 x10E3/uL (ref 0.1–0.9)
Monocytes: 10 %
Neutrophils Absolute: 2.3 x10E3/uL (ref 1.4–7.0)
Neutrophils: 51 %
Platelets: 150 x10E3/uL (ref 150–450)
RBC: 3.1 x10E6/uL — ABNORMAL LOW (ref 3.77–5.28)
RDW: 13 % (ref 11.7–15.4)
WBC: 4.4 x10E3/uL (ref 3.4–10.8)

## 2024-08-09 LAB — CMP14+EGFR
ALT: 18 IU/L (ref 0–32)
AST: 41 IU/L — ABNORMAL HIGH (ref 0–40)
Albumin: 3.9 g/dL (ref 3.8–4.8)
Alkaline Phosphatase: 111 IU/L (ref 49–135)
BUN/Creatinine Ratio: 19 (ref 12–28)
BUN: 15 mg/dL (ref 8–27)
Bilirubin Total: <0.2 mg/dL (ref 0.0–1.2)
CO2: 23 mmol/L (ref 20–29)
Calcium: 8.4 mg/dL — ABNORMAL LOW (ref 8.7–10.3)
Chloride: 102 mmol/L (ref 96–106)
Creatinine, Ser: 0.78 mg/dL (ref 0.57–1.00)
Globulin, Total: 2.6 g/dL (ref 1.5–4.5)
Glucose: 162 mg/dL — ABNORMAL HIGH (ref 70–99)
Potassium: 3.4 mmol/L — ABNORMAL LOW (ref 3.5–5.2)
Sodium: 142 mmol/L (ref 134–144)
Total Protein: 6.5 g/dL (ref 6.0–8.5)
eGFR: 80 mL/min/1.73 (ref >59)

## 2024-08-11 DIAGNOSIS — C50211 Malignant neoplasm of upper-inner quadrant of right female breast: Secondary | ICD-10-CM | POA: Diagnosis not present

## 2024-08-11 DIAGNOSIS — E1140 Type 2 diabetes mellitus with diabetic neuropathy, unspecified: Secondary | ICD-10-CM | POA: Diagnosis not present

## 2024-08-11 DIAGNOSIS — I4819 Other persistent atrial fibrillation: Secondary | ICD-10-CM | POA: Diagnosis not present

## 2024-08-11 DIAGNOSIS — I5032 Chronic diastolic (congestive) heart failure: Secondary | ICD-10-CM | POA: Diagnosis not present

## 2024-08-11 DIAGNOSIS — I110 Hypertensive heart disease with heart failure: Secondary | ICD-10-CM | POA: Diagnosis not present

## 2024-08-11 DIAGNOSIS — J45909 Unspecified asthma, uncomplicated: Secondary | ICD-10-CM | POA: Diagnosis not present

## 2024-08-16 DIAGNOSIS — C50211 Malignant neoplasm of upper-inner quadrant of right female breast: Secondary | ICD-10-CM | POA: Diagnosis not present

## 2024-08-16 DIAGNOSIS — I4819 Other persistent atrial fibrillation: Secondary | ICD-10-CM | POA: Diagnosis not present

## 2024-08-16 DIAGNOSIS — E1140 Type 2 diabetes mellitus with diabetic neuropathy, unspecified: Secondary | ICD-10-CM | POA: Diagnosis not present

## 2024-08-16 DIAGNOSIS — I5032 Chronic diastolic (congestive) heart failure: Secondary | ICD-10-CM | POA: Diagnosis not present

## 2024-08-16 DIAGNOSIS — I110 Hypertensive heart disease with heart failure: Secondary | ICD-10-CM | POA: Diagnosis not present

## 2024-08-16 DIAGNOSIS — J45909 Unspecified asthma, uncomplicated: Secondary | ICD-10-CM | POA: Diagnosis not present

## 2024-08-18 DIAGNOSIS — I5032 Chronic diastolic (congestive) heart failure: Secondary | ICD-10-CM | POA: Diagnosis not present

## 2024-08-18 DIAGNOSIS — I4819 Other persistent atrial fibrillation: Secondary | ICD-10-CM | POA: Diagnosis not present

## 2024-08-18 DIAGNOSIS — J45909 Unspecified asthma, uncomplicated: Secondary | ICD-10-CM | POA: Diagnosis not present

## 2024-08-18 DIAGNOSIS — E1140 Type 2 diabetes mellitus with diabetic neuropathy, unspecified: Secondary | ICD-10-CM | POA: Diagnosis not present

## 2024-08-18 DIAGNOSIS — I110 Hypertensive heart disease with heart failure: Secondary | ICD-10-CM | POA: Diagnosis not present

## 2024-08-18 DIAGNOSIS — C50211 Malignant neoplasm of upper-inner quadrant of right female breast: Secondary | ICD-10-CM | POA: Diagnosis not present

## 2024-08-22 DIAGNOSIS — I5032 Chronic diastolic (congestive) heart failure: Secondary | ICD-10-CM | POA: Diagnosis not present

## 2024-08-22 DIAGNOSIS — I4819 Other persistent atrial fibrillation: Secondary | ICD-10-CM | POA: Diagnosis not present

## 2024-08-22 DIAGNOSIS — I110 Hypertensive heart disease with heart failure: Secondary | ICD-10-CM | POA: Diagnosis not present

## 2024-08-22 DIAGNOSIS — E1140 Type 2 diabetes mellitus with diabetic neuropathy, unspecified: Secondary | ICD-10-CM | POA: Diagnosis not present

## 2024-08-22 DIAGNOSIS — J45909 Unspecified asthma, uncomplicated: Secondary | ICD-10-CM | POA: Diagnosis not present

## 2024-08-22 DIAGNOSIS — C50211 Malignant neoplasm of upper-inner quadrant of right female breast: Secondary | ICD-10-CM | POA: Diagnosis not present

## 2024-08-23 DIAGNOSIS — L570 Actinic keratosis: Secondary | ICD-10-CM | POA: Diagnosis not present

## 2024-08-23 DIAGNOSIS — L400 Psoriasis vulgaris: Secondary | ICD-10-CM | POA: Diagnosis not present

## 2024-08-26 ENCOUNTER — Ambulatory Visit: Payer: Self-pay | Admitting: Family Medicine

## 2024-08-31 DIAGNOSIS — S2242XD Multiple fractures of ribs, left side, subsequent encounter for fracture with routine healing: Secondary | ICD-10-CM | POA: Diagnosis not present

## 2024-08-31 DIAGNOSIS — I4819 Other persistent atrial fibrillation: Secondary | ICD-10-CM | POA: Diagnosis not present

## 2024-08-31 DIAGNOSIS — C50211 Malignant neoplasm of upper-inner quadrant of right female breast: Secondary | ICD-10-CM | POA: Diagnosis not present

## 2024-08-31 DIAGNOSIS — K5900 Constipation, unspecified: Secondary | ICD-10-CM | POA: Diagnosis not present

## 2024-08-31 DIAGNOSIS — Z9181 History of falling: Secondary | ICD-10-CM | POA: Diagnosis not present

## 2024-08-31 DIAGNOSIS — Z9981 Dependence on supplemental oxygen: Secondary | ICD-10-CM | POA: Diagnosis not present

## 2024-08-31 DIAGNOSIS — J45909 Unspecified asthma, uncomplicated: Secondary | ICD-10-CM | POA: Diagnosis not present

## 2024-08-31 DIAGNOSIS — I5032 Chronic diastolic (congestive) heart failure: Secondary | ICD-10-CM | POA: Diagnosis not present

## 2024-08-31 DIAGNOSIS — M810 Age-related osteoporosis without current pathological fracture: Secondary | ICD-10-CM | POA: Diagnosis not present

## 2024-08-31 DIAGNOSIS — F32A Depression, unspecified: Secondary | ICD-10-CM | POA: Diagnosis not present

## 2024-08-31 DIAGNOSIS — I110 Hypertensive heart disease with heart failure: Secondary | ICD-10-CM | POA: Diagnosis not present

## 2024-08-31 DIAGNOSIS — Z7984 Long term (current) use of oral hypoglycemic drugs: Secondary | ICD-10-CM | POA: Diagnosis not present

## 2024-08-31 DIAGNOSIS — D6489 Other specified anemias: Secondary | ICD-10-CM | POA: Diagnosis not present

## 2024-08-31 DIAGNOSIS — Z7901 Long term (current) use of anticoagulants: Secondary | ICD-10-CM | POA: Diagnosis not present

## 2024-08-31 DIAGNOSIS — E66811 Obesity, class 1: Secondary | ICD-10-CM | POA: Diagnosis not present

## 2024-08-31 DIAGNOSIS — E1140 Type 2 diabetes mellitus with diabetic neuropathy, unspecified: Secondary | ICD-10-CM | POA: Diagnosis not present

## 2024-08-31 DIAGNOSIS — Z6836 Body mass index (BMI) 36.0-36.9, adult: Secondary | ICD-10-CM | POA: Diagnosis not present

## 2024-08-31 DIAGNOSIS — Z170 Estrogen receptor positive status [ER+]: Secondary | ICD-10-CM | POA: Diagnosis not present

## 2024-08-31 DIAGNOSIS — Z79891 Long term (current) use of opiate analgesic: Secondary | ICD-10-CM | POA: Diagnosis not present

## 2024-09-07 DIAGNOSIS — C50211 Malignant neoplasm of upper-inner quadrant of right female breast: Secondary | ICD-10-CM | POA: Diagnosis not present

## 2024-09-07 DIAGNOSIS — J45909 Unspecified asthma, uncomplicated: Secondary | ICD-10-CM | POA: Diagnosis not present

## 2024-09-07 DIAGNOSIS — I5032 Chronic diastolic (congestive) heart failure: Secondary | ICD-10-CM | POA: Diagnosis not present

## 2024-09-07 DIAGNOSIS — I4819 Other persistent atrial fibrillation: Secondary | ICD-10-CM | POA: Diagnosis not present

## 2024-09-07 DIAGNOSIS — E1140 Type 2 diabetes mellitus with diabetic neuropathy, unspecified: Secondary | ICD-10-CM | POA: Diagnosis not present

## 2024-09-07 DIAGNOSIS — I110 Hypertensive heart disease with heart failure: Secondary | ICD-10-CM | POA: Diagnosis not present

## 2024-09-21 ENCOUNTER — Inpatient Hospital Stay: Admitting: Hematology

## 2024-09-21 ENCOUNTER — Inpatient Hospital Stay

## 2024-09-28 ENCOUNTER — Inpatient Hospital Stay (HOSPITAL_BASED_OUTPATIENT_CLINIC_OR_DEPARTMENT_OTHER): Admitting: Hematology

## 2024-09-28 ENCOUNTER — Telehealth: Payer: Self-pay

## 2024-09-28 ENCOUNTER — Other Ambulatory Visit: Payer: Self-pay

## 2024-09-28 ENCOUNTER — Inpatient Hospital Stay: Attending: Hematology

## 2024-09-28 VITALS — BP 120/66 | HR 77 | Temp 98.2°F | Resp 18 | Ht 62.0 in | Wt 200.0 lb

## 2024-09-28 DIAGNOSIS — D509 Iron deficiency anemia, unspecified: Secondary | ICD-10-CM

## 2024-09-28 DIAGNOSIS — M797 Fibromyalgia: Secondary | ICD-10-CM | POA: Diagnosis not present

## 2024-09-28 DIAGNOSIS — Z79810 Long term (current) use of selective estrogen receptor modulators (SERMs): Secondary | ICD-10-CM | POA: Diagnosis not present

## 2024-09-28 DIAGNOSIS — M810 Age-related osteoporosis without current pathological fracture: Secondary | ICD-10-CM | POA: Diagnosis not present

## 2024-09-28 DIAGNOSIS — Z923 Personal history of irradiation: Secondary | ICD-10-CM | POA: Diagnosis not present

## 2024-09-28 DIAGNOSIS — I4891 Unspecified atrial fibrillation: Secondary | ICD-10-CM | POA: Diagnosis not present

## 2024-09-28 DIAGNOSIS — Z170 Estrogen receptor positive status [ER+]: Secondary | ICD-10-CM

## 2024-09-28 DIAGNOSIS — Z881 Allergy status to other antibiotic agents status: Secondary | ICD-10-CM | POA: Diagnosis not present

## 2024-09-28 DIAGNOSIS — I10 Essential (primary) hypertension: Secondary | ICD-10-CM | POA: Diagnosis not present

## 2024-09-28 DIAGNOSIS — S2249XA Multiple fractures of ribs, unspecified side, initial encounter for closed fracture: Secondary | ICD-10-CM | POA: Diagnosis not present

## 2024-09-28 DIAGNOSIS — Z90721 Acquired absence of ovaries, unilateral: Secondary | ICD-10-CM | POA: Diagnosis not present

## 2024-09-28 DIAGNOSIS — Z888 Allergy status to other drugs, medicaments and biological substances status: Secondary | ICD-10-CM | POA: Diagnosis not present

## 2024-09-28 DIAGNOSIS — Z7901 Long term (current) use of anticoagulants: Secondary | ICD-10-CM | POA: Diagnosis not present

## 2024-09-28 DIAGNOSIS — E538 Deficiency of other specified B group vitamins: Secondary | ICD-10-CM

## 2024-09-28 DIAGNOSIS — Z1732 Human epidermal growth factor receptor 2 negative status: Secondary | ICD-10-CM | POA: Diagnosis not present

## 2024-09-28 DIAGNOSIS — R7401 Elevation of levels of liver transaminase levels: Secondary | ICD-10-CM | POA: Diagnosis not present

## 2024-09-28 DIAGNOSIS — Z90710 Acquired absence of both cervix and uterus: Secondary | ICD-10-CM | POA: Diagnosis not present

## 2024-09-28 DIAGNOSIS — E119 Type 2 diabetes mellitus without complications: Secondary | ICD-10-CM | POA: Diagnosis not present

## 2024-09-28 DIAGNOSIS — C50411 Malignant neoplasm of upper-outer quadrant of right female breast: Secondary | ICD-10-CM | POA: Diagnosis not present

## 2024-09-28 DIAGNOSIS — Z9049 Acquired absence of other specified parts of digestive tract: Secondary | ICD-10-CM | POA: Diagnosis not present

## 2024-09-28 DIAGNOSIS — G4730 Sleep apnea, unspecified: Secondary | ICD-10-CM | POA: Diagnosis not present

## 2024-09-28 DIAGNOSIS — N390 Urinary tract infection, site not specified: Secondary | ICD-10-CM | POA: Diagnosis not present

## 2024-09-28 DIAGNOSIS — Z79899 Other long term (current) drug therapy: Secondary | ICD-10-CM | POA: Diagnosis not present

## 2024-09-28 DIAGNOSIS — M1990 Unspecified osteoarthritis, unspecified site: Secondary | ICD-10-CM | POA: Diagnosis not present

## 2024-09-28 DIAGNOSIS — Z885 Allergy status to narcotic agent status: Secondary | ICD-10-CM | POA: Diagnosis not present

## 2024-09-28 DIAGNOSIS — Z9181 History of falling: Secondary | ICD-10-CM | POA: Diagnosis not present

## 2024-09-28 LAB — CMP (CANCER CENTER ONLY)
ALT: 40 U/L (ref 0–44)
AST: 90 U/L — ABNORMAL HIGH (ref 15–41)
Albumin: 4.3 g/dL (ref 3.5–5.0)
Alkaline Phosphatase: 89 U/L (ref 38–126)
Anion gap: 9 (ref 5–15)
BUN: 15 mg/dL (ref 8–23)
CO2: 29 mmol/L (ref 22–32)
Calcium: 9.4 mg/dL (ref 8.9–10.3)
Chloride: 101 mmol/L (ref 98–111)
Creatinine: 0.71 mg/dL (ref 0.44–1.00)
GFR, Estimated: >60 mL/min (ref >60)
Glucose, Bld: 141 mg/dL — ABNORMAL HIGH (ref 70–99)
Potassium: 3.9 mmol/L (ref 3.5–5.1)
Sodium: 139 mmol/L (ref 135–145)
Total Bilirubin: 0.5 mg/dL (ref 0.0–1.2)
Total Protein: 7.7 g/dL (ref 6.5–8.1)

## 2024-09-28 LAB — CBC WITH DIFFERENTIAL (CANCER CENTER ONLY)
Abs Immature Granulocytes: 0.01 K/uL (ref 0.00–0.07)
Basophils Absolute: 0 K/uL (ref 0.0–0.1)
Basophils Relative: 1 %
Eosinophils Absolute: 0.2 K/uL (ref 0.0–0.5)
Eosinophils Relative: 4 %
HCT: 34.9 % — ABNORMAL LOW (ref 36.0–46.0)
Hemoglobin: 11.2 g/dL — ABNORMAL LOW (ref 12.0–15.0)
Immature Granulocytes: 0 %
Lymphocytes Relative: 31 %
Lymphs Abs: 1.4 K/uL (ref 0.7–4.0)
MCH: 30.4 pg (ref 26.0–34.0)
MCHC: 32.1 g/dL (ref 30.0–36.0)
MCV: 94.8 fL (ref 80.0–100.0)
Monocytes Absolute: 0.4 K/uL (ref 0.1–1.0)
Monocytes Relative: 9 %
Neutro Abs: 2.4 K/uL (ref 1.7–7.7)
Neutrophils Relative %: 55 %
Platelet Count: 165 K/uL (ref 150–400)
RBC: 3.68 MIL/uL — ABNORMAL LOW (ref 3.87–5.11)
RDW: 13.4 % (ref 11.5–15.5)
WBC Count: 4.4 K/uL (ref 4.0–10.5)
nRBC: 0 % (ref 0.0–0.2)

## 2024-09-28 LAB — IRON AND IRON BINDING CAPACITY (CC-WL,HP ONLY)
Iron: 103 ug/dL (ref 28–170)
Saturation Ratios: 17 % (ref 10.4–31.8)
TIBC: 598 ug/dL — ABNORMAL HIGH (ref 250–450)
UIBC: 495 ug/dL — ABNORMAL HIGH (ref 148–442)

## 2024-09-28 LAB — VITAMIN B12: Vitamin B-12: 879 pg/mL (ref 180–914)

## 2024-09-28 LAB — FERRITIN: Ferritin: 129 ng/mL (ref 11–307)

## 2024-09-28 NOTE — Assessment & Plan Note (Signed)
 Stage IA, (pT1c, pN0), ER+/PR+/HER2-, Grade II  -found on screening mammogram. S/p right lumpectomy on 02/18/21 under Dr. 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 Margins and lymph nodes negative -Oncotype DX score of 0. -s/p radiation under Dr. [name] 04/09/21 - 05/07/21. -she started tamoxifen  in 05/2021, goal 5 years. She is tolerating well except some vaginal dryness/discharge.

## 2024-09-28 NOTE — Telephone Encounter (Signed)
 Contacted [facility], per Dr. Lanny.  Requested patient's latest mammography report.  Received report via fax and passed along to Dr. Lanny to review.

## 2024-09-28 NOTE — Progress Notes (Signed)
 Kayla [facility]   [phone] [phone]   Clinic Follow up Note   Patient Care Team: Kayla Bring, DO as PCP - General (Family Medicine) Kayla Soyla Lunger, MD as PCP - Electrophysiology (Cardiology) Kayla Wilbert SAUNDERS, MD as PCP - Sleep Medicine (Cardiology) Kayla Pugh, DO as PCP - Cardiology (Cardiology) Kayla Shoulders, MD as Consulting Physician (General Surgery) Kayla Callander, MD as Consulting Physician (Hematology) Kayla Domino, MD as Attending Physician (Radiation Oncology) Kayla [facility] as Physician Assistant (Cardiology) [facility], Kayla K, NP as Nurse Practitioner (Nurse Practitioner)  Date of Service:  09/28/2024  CHIEF COMPLAINT: f/u of right breast cancer  CURRENT THERAPY:  Tamoxifen  20 mg daily  Oncology History   Malignant neoplasm of upper-outer quadrant of right breast in female, estrogen receptor positive (HCC) Stage IA, (pT1c, pN0), ER+/PR+/HER2-, Grade II  -found on screening mammogram. S/p right lumpectomy on 02/18/21 under Dr. Aron, path showed 1.2 cm IDC with DCIS. Margins and lymph nodes negative -Oncotype DX score of 0. -s/p radiation under Kayla [facility] 04/09/21 - 05/07/21. -she started tamoxifen  in 05/2021, goal 5 years. She is tolerating well except some vaginal dryness/discharge.   Assessment & Plan Stage I right breast cancer, upper-outer quadrant, on tamoxifen  therapy Stage I right breast cancer diagnosed in 2022, currently on tamoxifen  therapy. Chest pain is not related to cancer as Signatera test was negative. Tamoxifen  therapy is well-tolerated and continues to be appropriate given her history of hysterectomy and oophorectomy, minimizing the risk of endometrial cancer. Tamoxifen  slightly increases the risk of blood clots, but she is mobile and past the period of increased risk post-fall. - Continue tamoxifen  therapy - Ensure mammogram is scheduled for December at [facility]   Healing rib fractures Rib fractures  sustained in August 2025 due to a fall. Healing is ongoing with expected recovery in approximately six months. Pain management is ongoing with Tylenol  and ibuprofen . Physical therapy is being utilized to aid recovery. She uses a walker for balance and strength issues. - Continue physical therapy - Use ibuprofen  once or twice daily and Tylenol  no more than once daily for pain management - Consider Flexeril  at night for muscle spasms  Osteopenia Diagnosed in 2022. She has osteoporosis and has been off bone medications for 20 years. Tamoxifen  is expected to provide some bone strengthening benefit. - Continue tamoxifen  therapy for bone health  Elevated AST Slightly elevated AST, likely related to Tylenol  use for pain management. No alcohol consumption reported. Previous liver function issues with ibuprofen  use. - Reduce Tylenol  use to once daily - Substitute ibuprofen  for Tylenol  as needed  Balance impairment Chronic balance impairment, exacerbated by recent rib fractures. Uses a walker for stability and is undergoing physical therapy to improve strength and balance. - Continue physical therapy - Use walker for stability  Plan - She is tolerating tamoxifen  well, will continue. - Continue physical therapy and using walker as needed due to her recent fall and rib fracture - Lab and follow-up in 6 months with NP Kayla    SUMMARY OF ONCOLOGIC HISTORY: Oncology History Overview Note  Cancer Staging Malignant neoplasm of upper-outer quadrant of right breast in female, estrogen receptor positive (HCC) Staging form: Breast, AJCC 8th Edition - Clinical stage from 11/22/2020: Stage IA (cT1b, cN0, cM0, G2, ER+, PR+, HER2-) - Signed by Kayla Callander, MD on 12/04/2020 Stage prefix: Initial diagnosis - Pathologic: Stage IA (pT1c, pN0, cM0, G2, ER+, PR+, HER2-) - Signed by Kayla Morna Pickle, NP on 02/27/2021 Histologic grading system:  3 grade system    Malignant neoplasm of upper-outer quadrant of  right breast in female, estrogen receptor positive (HCC)  11/05/2020 Breast US    US  Breast 11/05/20  IMPRESSION No Sonographic correlate is seen for the round microlobulated 9mm mass in the upper outer right breast 10cm from the nipple, 9: 00 position.  Given this mass is new on mammography and has suspicious borders, a stereotactic guided biopsy is recommended.     11/22/2020 Cancer Staging   Staging form: Breast, AJCC 8th Edition - Clinical stage from 11/22/2020: Stage IA (cT1b, cN0, cM0, G2, ER+, PR+, HER2-) - Signed by Kayla Callander, MD on 12/04/2020   11/22/2020 Initial Biopsy    Diagnosis 11/22/20  Breast, right, needle core biopsy, 10 cmfn, upper outer quadrant, post depth - INVASIVE DUCTAL CARCINOMA. SEE NOTE Diagnosis Note Carcinoma measures 0.7 cm in greatest linear dimension and appears grade 2. Dr. Alvaro reviewed the case and concurs with the diagnosis. A breast prognostic profile (ER, PR, Ki-67 and HER2) is pending and will be reported in an addendum. Dr. Vonzell was notified on 11/23/2020.   11/22/2020 Receptors her2    ROGNOSTIC INDICATORS Results: IMMUNOHISTOCHEMICAL AND MORPHOMETRIC ANALYSIS PERFORMED MANUALLY The tumor cells are NEGATIVE for Her2 (1+). Estrogen Receptor: 95%, POSITIVE, STRONG STAINING INTENSITY Progesterone Receptor: 95%, POSITIVE, STRONG STAINING INTENSITY Proliferation Marker Ki67: 10%   11/28/2020 Initial Diagnosis   Malignant neoplasm of upper-outer quadrant of right breast in female, estrogen receptor positive (HCC)   12/22/2020 Genetic Testing   Negative hereditary cancer genetic testing: no pathogenic variants detected in [facility] Common Hereditary Cancers Panel + Melanoma Genes.  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  The report date is December 22, 2020.    The Common Hereditary Cancers Panel+Melanoma Genes offered by [facility] includes sequencing and/or deletion duplication testing of the following 52 genes: APC,  ATM, AXIN2, BAP1, BARD1, BMPR1A, BRCA1, BRCA2, BRIP1, CDH1, CDK4, CDKN2A (p14ARF), CDKN2A (p16INK4a), CHEK2, CTNNA1, DICER1, EPCAM (Deletion/duplication testing only), GREM1 (promoter region deletion/duplication testing only), HOXB13, KIT, MEN1, MITF, MLH1, MSH2, MSH3, MSH6, MUTYH, NBN, NF1, NHTL1, PALB2, PDGFRA, PMS2, POLD1, POLE, PTEN, POT1, RAD50, RAD51C, RAD51D, RB1, RNF43, SDHA, SDHB, SDHC, SDHD, SMAD4, SMARCA4, STK11, TP53, TSC1, TSC2, and VHL.  The following genes were evaluated for sequence changes only: SDHA and HOXB13 c.251G>A variant only.   02/18/2021 Surgery   FINAL MICROSCOPIC DIAGNOSIS:   A. BREAST, RIGHT, LUMPECTOMY:  - Invasive ductal carcinoma, 1.2 cm, grade 2  - Ductal carcinoma in situ, intermediate grade  - Resection margins are negative for carcinoma; posterior margin is less than 1 mm from carcinoma  - Biopsy site changes  - See oncology table   B. LYMPH NODE, RIGHT AXILLARY #1, SENTINEL, EXCISION:  - Lymph node, negative for carcinoma (0/1)   C. LYMPH NODE, RIGHT AXILLARY #2, SENTINEL, EXCISION:  - Lymph node, negative for carcinoma (0/1)   D. LYMPH NODE, RIGHT AXILLARY, SENTINEL, EXCISION:  - Lymph node, negative for carcinoma (0/1)   E. LYMPH NODE, RIGHT AXILLARY #3, SENTINEL, EXCISION:  - Lymph node, negative for carcinoma (0/1)   F. LYMPH NODE, RIGHT AXILLARY, SENTINEL, EXCISION:  - Lymph node, negative for carcinoma (0/1)     02/18/2021 Oncotype testing   Oncotype DX was obtained on the final surgical sample and the recurrence score of 0 predicts a risk of recurrence outside the breast over the next 9 years of 3%, if the patient's only systemic therapy is an antiestrogen for 5 years.  It  also predicts no benefit from chemotherapy.   02/27/2021 Cancer Staging   Staging form: Breast, AJCC 8th Edition - Pathologic: Stage IA (pT1c, pN0, cM0, G2, ER+, PR+, HER2-) - Signed by Kayla Morna Pickle, NP on 02/27/2021 Histologic grading system: 3 grade system    03/2021 - 04/2021 Radiation Therapy   Completed adjuvant Radiation by Kayla [facility]   05/2021 -  Neo-Adjuvant Anti-estrogen oral therapy   Began tamoxifen  mid July 2022   07/29/2021 Survivorship   SCP delivered by Kayla Burton, NP      Discussed the use of AI scribe software for clinical note transcription with the patient, who gave verbal consent to proceed.  History of Present Illness Kayla Bennett is a 73 year old female with breast cancer who presents for follow-up.  In August 2025, she experienced a fall resulting in four broken ribs, with soreness and delayed energy recovery. She uses a walker due to balance issues and is undergoing physical therapy. A previous fall in June 2025 resulted in bruising and revealed two healed rib fractures.  She has osteopenia, previously osteoporosis, and was on bone-strengthening medications for twenty years but stopped due to concerns about bone splintering. She started tamoxifen  in July 2022 and continues it post-fall. Her last bone density scan was in 2022.  Current medications include tamoxifen  and Tylenol  500 mg three times a day. She has a history of elevated liver enzymes with Advil  and is cautious with ibuprofen . She takes calcium  and vitamin D  supplements regularly.     All other systems were reviewed with the patient and are negative.  MEDICAL HISTORY:  Past Medical History:  Diagnosis Date   Allergy 1978   Food allergies, seasonal allergies, medicine allergies   Anemia    Arthritis    Asthma    Blood transfusion without reported diagnosis 1985   Breast cancer (HCC)    Diabetes (HCC)    Dyspnea    Dysrhythmia    afibb on occasion   Family history of adverse reaction to anesthesia    mother had reaction to versed ...during surgery, stopped breathing   Fibromyalgia    GERD (gastroesophageal reflux disease)    Headache    History of hiatal hernia    Hyperlipidemia 1990?   Hypertension    Migraines    Osteopenia     Osteoporosis    Oxygen  deficiency 2023   Psoriasis    Sleep apnea 2023   Yeast infection 10/30/2021    SURGICAL HISTORY: Past Surgical History:  Procedure Laterality Date   ABDOMINAL HYSTERECTOMY     APPENDECTOMY     ATRIAL FIBRILLATION ABLATION N/A 12/10/2021   Procedure: ATRIAL FIBRILLATION ABLATION;  Surgeon: Kayla Soyla Lunger, MD;  Location: [facility];  Service: Cardiovascular;  Laterality: N/A;   BREAST LUMPECTOMY WITH RADIOACTIVE SEED AND SENTINEL LYMPH NODE BIOPSY Right 02/18/2021   Procedure: RIGHT BREAST LUMPECTOMY WITH RADIOACTIVE SEED AND SENTINEL LYMPH NODE BIOPSY;  Surgeon: Kayla Shoulders, MD;  Location: [facility] OR;  Service: General;  Laterality: Right;   BREAST SURGERY  2022   broken wrist     CARDIOVERSION N/A 04/04/2021   Procedure: CARDIOVERSION;  Surgeon: Kayla Wilbert SAUNDERS, MD;  Location: [facility] ENDOSCOPY;  Service: Cardiovascular;  Laterality: N/A;   CARDIOVERSION N/A 08/14/2021   Procedure: CARDIOVERSION;  Surgeon: Barbaraann Darryle Ned, MD;  Location: [facility] ENDOSCOPY;  Service: Cardiovascular;  Laterality: N/A;   CARDIOVERSION N/A 09/19/2021   Procedure: CARDIOVERSION;  Surgeon: Barbaraann Darryle Ned, MD;  Location: [facility] ENDOSCOPY;  Service: Cardiovascular;  Laterality: N/A;   CESAREAN SECTION  1976 and 1977   CHOLECYSTECTOMY     FRACTURE SURGERY     right wrist   HYSTERECTOMY ABDOMINAL WITH SALPINGO-OOPHORECTOMY     LYSIS OF ADHESION     RIGHT/LEFT HEART CATH AND CORONARY ANGIOGRAPHY N/A 02/18/2024   Procedure: RIGHT/LEFT HEART CATH AND CORONARY ANGIOGRAPHY;  Surgeon: Verlin Lonni BIRCH, MD;  Location: [facility];  Service: Cardiovascular;  Laterality: N/A;   SHOULDER SURGERY Left    TUBAL LIGATION      I have reviewed the social history and family history with the patient and they are unchanged from previous note.  ALLERGIES:  is allergic to lotensin [benazepril hcl], morphine, erythromycin base, hydrocodone , other, banana, chocolate, gluten meal,  tizanidine, and wound dressing adhesive.  MEDICATIONS:  Current Outpatient Medications  Medication Sig Dispense Refill   acetaminophen  (TYLENOL ) 500 MG tablet Take 2 tablets (1,000 mg total) by mouth every 8 (eight) hours. 30 tablet 0   albuterol  (VENTOLIN  HFA) 108 (90 Base) MCG/ACT inhaler Inhale 2 puffs into the lungs every 6 (six) hours as needed for wheezing or shortness of breath. 8 g 0   bisacodyl  (DULCOLAX) 10 MG suppository Place 1 suppository (10 mg total) rectally daily as needed for moderate constipation. 12 suppository 0   Calcium  Carbonate (CALCIUM  600 PO) Take 600 mg by mouth in the morning.     cetirizine (ZYRTEC) 10 MG tablet Take 10 mg by mouth in the morning.     cholecalciferol  (VITAMIN D3) 25 MCG (1000 UNIT) tablet Take 1,000 Units by mouth in the morning and at bedtime.     cyclobenzaprine  (FLEXERIL ) 10 MG tablet Take 1 tablet (10 mg total) by mouth 3 (three) times daily. 30 tablet 0   diclofenac  Sodium (VOLTAREN ) 1 % GEL Apply 2 g topically 4 (four) times daily.     ELIQUIS  5 MG TABS tablet TAKE ONE TABLET BY MOUTH TWICE DAILY 180 tablet 1   Evolocumab  (REPATHA  SURECLICK) 140 MG/ML SOAJ Inject 140 mg into the skin every 14 (fourteen) days. (Patient not taking: Reported on 08/08/2024) 2 mL 11   ferrous sulfate  325 (65 FE) MG tablet Take 325 mg by mouth every evening.     furosemide  (LASIX ) 40 MG tablet Take 1 tablet (40 mg total) by mouth daily. 90 tablet 1   gabapentin  (NEURONTIN ) 300 MG capsule Take 1 capsule (300 mg total) by mouth 3 (three) times daily. 90 capsule 5   hydrocortisone 2.5 % cream Apply 1 Application topically 2 (two) times daily.     lidocaine  (LIDODERM ) 5 % Place 1 patch onto the skin daily. Remove & Discard patch within 12 hours or as directed by MD 30 patch 0   losartan  (COZAAR ) 100 MG tablet Take 1 tablet (100 mg total) by mouth daily. 90 tablet 1   metFORMIN  (GLUCOPHAGE -XR) 500 MG 24 hr tablet TAKE ONE TABLET BY MOUTH EVERY DAY 90 tablet 3    metoprolol  succinate (TOPROL -XL) 100 MG 24 hr tablet Take 1 tablet (100 mg total) by mouth daily. Take with or immediately following a meal. (Patient taking differently: Take 100 mg by mouth in the morning and at bedtime. Take with or immediately following a meal.) 90 tablet 2   nystatin cream (MYCOSTATIN) Apply 1 Application topically as needed for dry skin.     pantoprazole  (PROTONIX ) 40 MG tablet TAKE ONE TABLET BY MOUTH TWICE DAILY 180 tablet 0   polyethylene glycol (MIRALAX  / GLYCOLAX ) 17 g  packet Take 17 g by mouth daily. 14 each 0   potassium chloride  SA (KLOR-CON  M) 20 MEQ tablet Take 1 tablet (20 mEq total) by mouth daily. (Patient taking differently: Take 20 mEq by mouth in the morning.) 90 tablet 1   Prenatal Vit-Fe Fumarate-FA (PRENATAL VITAMIN PLUS LOW IRON PO) Take 1 tablet by mouth in the morning.     simethicone  (MYLICON) 80 MG chewable tablet Chew 1 tablet (80 mg total) by mouth every 6 (six) hours as needed for flatulence. 30 tablet 0   tamoxifen  (NOLVADEX ) 20 MG tablet Take 1 tablet (20 mg total) by mouth every morning. 90 tablet 3   triamcinolone (KENALOG) 0.1 % Apply 1 application  topically 2 (two) times daily as needed (psoriasis). Mixed with Cetaphil 1:1     UNABLE TO FIND OUTPATIENT PHYSICAL THERAPY    Diagnosis: Generalized debility due to UTI, acute gastroenteritis 1 Mutually Defined 0   venlafaxine  XR (EFFEXOR -XR) 37.5 MG 24 hr capsule Take 1 capsule (37.5 mg total) by mouth at bedtime. 90 capsule 3   venlafaxine  XR (EFFEXOR -XR) 75 MG 24 hr capsule TAKE ONE CAPSULE BY MOUTH EVERY DAY (Patient taking differently: Take 75 mg by mouth every evening. Take 1 tablet by mouth with 1 tablet of 37.5mg  Venlafaxine  for a combined dose of 112.5mg  Venlafaxine .) 90 capsule 1   vitamin C (ASCORBIC ACID) 500 MG tablet Take 500 mg by mouth every evening.     No current facility-administered medications for this visit.    PHYSICAL EXAMINATION: ECOG PERFORMANCE STATUS: 1 - Symptomatic  but completely ambulatory  Vitals:   09/28/24 1013  BP: 120/66  Pulse: 77  Resp: 18  Temp: 98.2 F (36.8 C)  SpO2: 96%   Wt Readings from Last 3 Encounters:  09/28/24 200 lb (90.7 kg)  08/08/24 203 lb (92.1 kg)  06/29/24 200 lb (90.7 kg)     GENERAL:alert, no distress and comfortable SKIN: skin color, texture, turgor are normal, no rashes or significant lesions EYES: normal, Conjunctiva are pink and non-injected, sclera clear NECK: supple, thyroid  normal size, non-tender, without nodularity LYMPH:  no palpable lymphadenopathy in the cervical, axillary  LUNGS: clear to auscultation and percussion with normal breathing effort HEART: regular rate & rhythm and no murmurs and no lower extremity edema ABDOMEN:abdomen soft, non-tender and normal bowel sounds Musculoskeletal:no cyanosis of digits and no clubbing  NEURO: alert & oriented x 3 with fluent speech, no focal motor/sensory deficits Breasts: Breast inspection showed them to be symmetrical with no nipple discharge. Palpation of the breasts and axilla revealed no obvious mass that I could appreciate.  Physical Exam    LABORATORY DATA:  I have reviewed the data as listed    Latest Ref Rng & Units 09/28/2024    9:50 AM 08/08/2024    1:59 PM 07/07/2024    5:40 AM  CBC  WBC 4.0 - 10.5 Bennett/uL 4.4  4.4  4.6   Hemoglobin 12.0 - 15.0 g/dL 88.7  9.6  89.2   Hematocrit 36.0 - 46.0 % 34.9  30.9  33.3   Platelets 150 - 400 Bennett/uL 165  150  194         Latest Ref Rng & Units 09/28/2024    9:50 AM 08/08/2024    1:59 PM 07/07/2024    5:40 AM  CMP  Glucose 70 - 99 mg/dL 858  837  885   BUN 8 - 23 mg/dL 15  15  14    Creatinine 0.44 - 1.00 mg/dL  0.71  0.78  0.66   Sodium 135 - 145 mmol/L 139  142  142   Potassium 3.5 - 5.1 mmol/L 3.9  3.4  3.7   Chloride 98 - 111 mmol/L 101  102  102   CO2 22 - 32 mmol/L 29  23  25    Calcium  8.9 - 10.3 mg/dL 9.4  8.4  8.7   Total Protein 6.5 - 8.1 g/dL 7.7  6.5    Total Bilirubin 0.0 - 1.2 mg/dL  0.5  <9.7    Alkaline Phos 38 - 126 U/L 89  111    AST 15 - 41 U/L 90  41    ALT 0 - 44 U/L 40  18        RADIOGRAPHIC STUDIES: I have personally reviewed the radiological images as listed and agreed with the findings in the report. No results found.    No orders of the defined types were placed in this encounter.  All questions were answered. The patient knows to call the clinic with any problems, questions or concerns. No barriers to learning was detected. The total time spent in the appointment was 25 minutes, including review of chart and various tests results, discussions about plan of care and coordination of care plan     Onita Mattock, MD 09/28/2024

## 2024-09-29 ENCOUNTER — Other Ambulatory Visit: Payer: Self-pay | Admitting: Family Medicine

## 2024-10-06 ENCOUNTER — Ambulatory Visit: Admitting: Family Medicine

## 2024-10-28 ENCOUNTER — Other Ambulatory Visit: Payer: Self-pay | Admitting: Family Medicine

## 2024-11-01 ENCOUNTER — Telehealth: Payer: Self-pay

## 2024-11-01 NOTE — Telephone Encounter (Signed)
 Copied from CRM #[id number]. Topic: General - Other >> Nov 01, 2024  2:27 PM Dedra B wrote: Reason for CRM: Sheree from [employer] called to verify if statin therapy form was received. Informed her that it was. She said pt's PCP needs to approve or deny, sign and fax back.[phone]

## 2024-11-03 ENCOUNTER — Other Ambulatory Visit: Payer: Self-pay | Admitting: Family Medicine

## 2024-11-03 NOTE — Telephone Encounter (Signed)
Form was faxed with confirmation.

## 2024-11-14 ENCOUNTER — Ambulatory Visit
Admission: EM | Admit: 2024-11-14 | Discharge: 2024-11-14 | Disposition: A | Discharge status: Home / Self Care | Attending: Family Medicine | Admitting: Family Medicine

## 2024-11-14 DIAGNOSIS — H109 Unspecified conjunctivitis: Secondary | ICD-10-CM

## 2024-11-14 MED ORDER — MOXIFLOXACIN HCL 0.5 % OP SOLN: 1.0000 [drp] | Freq: Three times a day (TID) | OPHTHALMIC | 0 refills | Status: AC

## 2024-11-14 NOTE — Discharge Instructions (Addendum)
 Advised patient to instill eyedrops as directed.  Advised if symptoms worsen and/or unresolved please follow-up with your ophthalmologist/optometry for further evaluation.

## 2024-11-14 NOTE — ED Triage Notes (Signed)
 Pt c/o intermittent HA, runny nose and bilateral ear pain x 4 days. Taking Advil  cold and sinus and saline mist prn.

## 2024-11-14 NOTE — ED Provider Notes (Signed)
 " [facility]    CSN: [id number] Arrival date & time: 11/14/24  0844      History   Chief Complaint Chief Complaint  Patient presents with   Nasal Congestion   Otalgia   Headache    HPI Kayla Bennett is a 73 y.o. female.   HPI pleasant 73 year old female presents with bilateral eye redness itchiness and discharge, intermittent headache, runny nose and bilateral ear pain for 4 days PMH significant for T2DM with obesity, diastolic dysfunction with chronic heart failure, breast cancer, and central sleep apnea syndrome.  Patient is currently on apixaban  and denies any unusual bleeding.  Past Medical History:  Diagnosis Date   Allergy 1978   Food allergies, seasonal allergies, medicine allergies   Anemia    Arthritis    Asthma    Blood transfusion without reported diagnosis 1985   Breast cancer (HCC)    Diabetes (HCC)    Dyspnea    Dysrhythmia    afibb on occasion   Family history of adverse reaction to anesthesia    mother had reaction to versed ...during surgery, stopped breathing   Fibromyalgia    GERD (gastroesophageal reflux disease)    Headache    History of hiatal hernia    Hyperlipidemia 1990?   Hypertension    Migraines    Osteopenia    Osteoporosis    Oxygen  deficiency 2023   Psoriasis    Sleep apnea 2023   Yeast infection 10/30/2021    Patient Active Problem List   Diagnosis Date Noted   Central sleep apnea syndrome 07/07/2024   Fall 07/07/2024   Long term current use of anticoagulant therapy 07/07/2024   Osteoporosis 07/07/2024   Osteoarthritis 07/07/2024   Diastolic dysfunction with chronic heart failure (HCC) 06/30/2024   Rib fractures 06/29/2024   Anemia 06/29/2024   Multiple rib fractures 06/29/2024   DDD (degenerative disc disease), cervical 10/09/2023   Rib pain on left side 04/08/2023   Hypokalemia 08/22/2022   Hypomagnesemia 08/22/2022   Balance problem 06/25/2022   Nausea 10/12/2021   Headache 10/12/2021    Generalized weakness 10/12/2021   Type 2 diabetes mellitus with obesity    Hypertension    GERD (gastroesophageal reflux disease)    Iron deficiency anemia 09/25/2021   Secondary hypercoagulable state 07/26/2021   Persistent atrial fibrillation (HCC)    Genetic testing 12/26/2020   Malignant neoplasm of upper-outer quadrant of right breast in female, estrogen receptor positive (HCC) 11/28/2020    Past Surgical History:  Procedure Laterality Date   ABDOMINAL HYSTERECTOMY     APPENDECTOMY     ATRIAL FIBRILLATION ABLATION N/A 12/10/2021   Procedure: ATRIAL FIBRILLATION ABLATION;  Surgeon: Inocencio Soyla Lunger, MD;  Location: [facility];  Service: Cardiovascular;  Laterality: N/A;   BREAST LUMPECTOMY WITH RADIOACTIVE SEED AND SENTINEL LYMPH NODE BIOPSY Right 02/18/2021   Procedure: RIGHT BREAST LUMPECTOMY WITH RADIOACTIVE SEED AND SENTINEL LYMPH NODE BIOPSY;  Surgeon: Aron Shoulders, MD;  Location: [facility] OR;  Service: General;  Laterality: Right;   BREAST SURGERY  2022   broken wrist     CARDIOVERSION N/A 04/04/2021   Procedure: CARDIOVERSION;  Surgeon: Shlomo Wilbert SAUNDERS, MD;  Location: [facility];  Service: Cardiovascular;  Laterality: N/A;   CARDIOVERSION N/A 08/14/2021   Procedure: CARDIOVERSION;  Surgeon: Barbaraann Darryle Ned, MD;  Location: [facility] ENDOSCOPY;  Service: Cardiovascular;  Laterality: N/A;   CARDIOVERSION N/A 09/19/2021   Procedure: CARDIOVERSION;  Surgeon: Barbaraann Darryle Ned, MD;  Location: [facility] ENDOSCOPY;  Service: Cardiovascular;  Laterality: N/A;   CESAREAN SECTION  1976 and 1977   CHOLECYSTECTOMY     FRACTURE SURGERY     right wrist   HYSTERECTOMY ABDOMINAL WITH SALPINGO-OOPHORECTOMY     LYSIS OF ADHESION     RIGHT/LEFT HEART CATH AND CORONARY ANGIOGRAPHY N/A 02/18/2024   Procedure: RIGHT/LEFT HEART CATH AND CORONARY ANGIOGRAPHY;  Surgeon: Verlin Lonni BIRCH, MD;  Location: [facility];  Service: Cardiovascular;  Laterality: N/A;   SHOULDER SURGERY  Left    TUBAL LIGATION      OB History   No obstetric history on file.      Home Medications    Prior to Admission medications  Medication Sig Start Date End Date Taking? Authorizing Provider  moxifloxacin  (VIGAMOX ) 0.5 % ophthalmic solution Place 1 drop into both eyes 3 (three) times daily for 7 days. 11/14/24 11/21/24 Yes Teddy Sharper, FNP  acetaminophen  (TYLENOL ) 500 MG tablet Take 2 tablets (1,000 mg total) by mouth every 8 (eight) hours. 07/07/24   Tobie Yetta HERO, MD  albuterol  (VENTOLIN  HFA) [phone] Base) MCG/ACT inhaler Inhale 2 puffs into the lungs every 6 (six) hours as needed for wheezing or shortness of breath. 10/09/23   Alvia Bring, DO  bisacodyl  (DULCOLAX) 10 MG suppository Place 1 suppository (10 mg total) rectally daily as needed for moderate constipation. 07/07/24   Tobie Yetta HERO, MD  Calcium  Carbonate (CALCIUM  600 PO) Take 600 mg by mouth in the morning.    [provider]  cetirizine (ZYRTEC) 10 MG tablet Take 10 mg by mouth in the morning.    [provider]  cholecalciferol  (VITAMIN D3) 25 MCG (1000 UNIT) tablet Take 1,000 Units by mouth in the morning and at bedtime.    [provider]  cyclobenzaprine  (FLEXERIL ) 10 MG tablet Take 1 tablet (10 mg total) by mouth 3 (three) times daily. 07/07/24   Tobie Yetta HERO, MD  diclofenac  Sodium (VOLTAREN ) 1 % GEL Apply 2 g topically 4 (four) times daily. 07/07/24   Tobie Yetta HERO, MD  ELIQUIS  5 MG TABS tablet TAKE ONE TABLET BY MOUTH TWICE DAILY 05/02/24   Tobb, Kardie, DO  Evolocumab  (REPATHA  SURECLICK) 140 MG/ML SOAJ Inject 140 mg into the skin every 14 (fourteen) days. Patient not taking: Reported on 08/08/2024 06/13/24   Tobb, Kardie, DO  ferrous sulfate  325 (65 FE) MG tablet Take 325 mg by mouth every evening.    [provider]  furosemide  (LASIX ) 40 MG tablet Take 1 tablet (40 mg total) by mouth daily. 08/03/24   Tobb, Kardie, DO  gabapentin  (NEURONTIN ) 300 MG capsule Take 1 capsule  (300 mg total) by mouth 3 (three) times daily. 08/08/24   Alvia Bring, DO  hydrocortisone 2.5 % cream Apply 1 Application topically 2 (two) times daily.    [provider]  lidocaine  (LIDODERM ) 5 % Place 1 patch onto the skin daily. Remove & Discard patch within 12 hours or as directed by MD 07/07/24   Tobie Yetta HERO, MD  losartan  (COZAAR ) 100 MG tablet Take 1 tablet (100 mg total) by mouth daily. 08/03/24   Tobb, Kardie, DO  metFORMIN (GLUCOPHAGE-XR) 500 MG 24 hr tablet TAKE ONE TABLET BY MOUTH EVERY DAY 08/08/24   Alvia Bring, DO  metoprolol  succinate (TOPROL -XL) 100 MG 24 hr tablet Take 1 tablet (100 mg total) by mouth daily. Take with or immediately following a meal. 11/03/24   Alvia Bring, DO  nystatin cream (MYCOSTATIN) Apply 1 Application topically as needed  for dry skin.    [provider]  pantoprazole  (PROTONIX ) 40 MG tablet TAKE ONE TABLET BY MOUTH TWICE DAILY 09/29/24   Alvia Bring, DO  polyethylene glycol (MIRALAX  / GLYCOLAX ) 17 g packet Take 17 g by mouth daily. 07/08/24   Tobie Yetta HERO, MD  potassium chloride  SA (KLOR-CON  M) 20 MEQ tablet Take 1 tablet (20 mEq total) by mouth daily. 10/28/24   Alvia Bring, DO  Prenatal Vit-Fe Fumarate-FA (PRENATAL VITAMIN PLUS LOW IRON PO) Take 1 tablet by mouth in the morning.    [provider]  simethicone  (MYLICON) 80 MG chewable tablet Chew 1 tablet (80 mg total) by mouth every 6 (six) hours as needed for flatulence. 07/07/24   Tobie Yetta HERO, MD  tamoxifen  (NOLVADEX ) 20 MG tablet Take 1 tablet (20 mg total) by mouth every morning. 09/22/23   Burton, [name], NP  triamcinolone (KENALOG) 0.1 % Apply 1 application  topically 2 (two) times daily as needed (psoriasis). Mixed with Cetaphil 1:1    [provider]  UNABLE TO FIND OUTPATIENT PHYSICAL THERAPY    Diagnosis: Generalized debility due to UTI, acute gastroenteritis 08/23/22   Rai, Nydia POUR, MD  venlafaxine  XR (EFFEXOR -XR) 37.5 MG 24 hr capsule  Take 1 capsule (37.5 mg total) by mouth at bedtime. 07/21/24   Alvia Bring, DO  venlafaxine  XR (EFFEXOR -XR) 75 MG 24 hr capsule TAKE ONE CAPSULE BY MOUTH EVERY DAY Patient taking differently: Take 75 mg by mouth every evening. Take 1 tablet by mouth with 1 tablet of 37.5mg  Venlafaxine  for a combined dose of 112.5mg  Venlafaxine . 04/25/24   Alvia Bring, DO  vitamin C (ASCORBIC ACID) 500 MG tablet Take 500 mg by mouth every evening.    [provider]    Family History Family History  Problem Relation Age of Onset   Atrial fibrillation Mother    Heart attack Mother 26   Arthritis Mother    Cancer Mother    Diabetes Mother    Heart disease Mother    Hypertension Mother    Colon cancer Father 49   Heart attack Father 87   Cancer Father    Early death Father    Heart disease Father    Hypertension Father    Heart attack Paternal Grandmother 33   Cancer Maternal Uncle        melanoma    Colon cancer Paternal Uncle    Cancer Paternal Uncle    Diabetes Paternal Uncle    Heart disease Paternal Uncle    Obesity Paternal Uncle    Colon cancer Cousin    Atrial fibrillation Cousin    Stroke Maternal Grandmother    Stroke Paternal Grandfather    Arthritis Maternal Aunt    Diabetes Maternal Aunt    Hearing loss Maternal Aunt    Cancer Paternal Uncle    Heart disease Paternal Uncle    Obesity Paternal Uncle    Diabetes Maternal Aunt    Diabetes Maternal Aunt    Heart disease Maternal Aunt    Diabetes Maternal Uncle    Hearing loss Maternal Uncle    Diabetes Paternal Aunt    Hearing loss Paternal Aunt    Diabetes Sister    Hyperlipidemia Sister    Hypertension Sister    Diabetes Paternal Uncle    Hearing loss Paternal Uncle    Diabetes Paternal Uncle    Hearing loss Paternal Uncle    Heart disease Paternal Uncle    Hypertension Paternal Uncle  Diabetes Paternal Uncle    Stroke Maternal Uncle     Social History Social History[1]   Allergies   Lotensin  [benazepril hcl], Morphine, Erythromycin base, Hydrocodone , Other, Banana, Chocolate, Gluten meal, Tizanidine, and Wound dressing adhesive   Review of Systems Review of Systems  Eyes:  Positive for pain.     Physical Exam Triage Vital Signs ED Triage Vitals  Encounter Vitals Group     BP      Girls Systolic BP Percentile      Girls Diastolic BP Percentile      Boys Systolic BP Percentile      Boys Diastolic BP Percentile      Pulse      Resp      Temp      Temp src      SpO2      Weight      Height      Head Circumference      Peak Flow      Pain Score      Pain Loc      Pain Education      Exclude from Growth Chart    No data found.  Updated Vital Signs BP (!) 145/66 (BP Location: Left Arm)   Pulse 82   Temp 99.3 F (37.4 C) (Oral)   Resp 17   SpO2 93%    Physical Exam Vitals and nursing note reviewed.  Constitutional:      General: She is not in acute distress.    Appearance: Normal appearance. She is obese. She is not ill-appearing.  HENT:     Head: Normocephalic and atraumatic.     Right Ear: Tympanic membrane, ear canal and external ear normal.     Left Ear: Tympanic membrane, ear canal and external ear normal.     Mouth/Throat:     Mouth: Mucous membranes are moist.     Pharynx: Oropharynx is clear.  Eyes:     Extraocular Movements: Extraocular movements intact.     Conjunctiva/sclera: Conjunctivae normal.     Pupils: Pupils are equal, round, and reactive to light.     Comments: Bilateral eyes: Sclera with +3 injection, mucopurulent discharge noted at bilateral inner canthus  Cardiovascular:     Rate and Rhythm: Normal rate and regular rhythm.     Pulses: Normal pulses.     Heart sounds: Normal heart sounds.  Pulmonary:     Effort: Pulmonary effort is normal.     Breath sounds: Normal breath sounds. No wheezing, rhonchi or rales.  Musculoskeletal:        General: Normal range of motion.  Skin:    General: Skin is warm and dry.   Neurological:     General: No focal deficit present.     Mental Status: She is alert and oriented to person, place, and time. Mental status is at baseline.  Psychiatric:        Mood and Affect: Mood normal.        Behavior: Behavior normal.      UC Treatments / Results  Labs (all labs ordered are listed, but only abnormal results are displayed) Labs Reviewed - No data to display  EKG   Radiology No results found.  Procedures Procedures (including critical care time)  Medications Ordered in UC Medications - No data to display  Initial Impression / Assessment and Plan / UC Course  I have reviewed the triage vital signs and the nursing notes.  Pertinent labs & imaging  results that were available during my care of the patient were reviewed by me and considered in my medical decision making (see chart for details).     MDM: 1.  Conjunctivitis of both eyes, unspecified conjunctivitis type-Rx'd Vigamox  0.5% ophthalmic solution: Place 1 drop into both eyes 3 times daily for the next 7 days. Advised patient to instill eyedrops as directed.  Advised if symptoms worsen and/or unresolved please follow-up with your ophthalmologist/optometry for further evaluation.  Patient discharged home, hemodynamically stable. Final Clinical Impressions(s) / UC Diagnoses   Final diagnoses:  Conjunctivitis of both eyes, unspecified conjunctivitis type     Discharge Instructions      Advised patient to instill eyedrops as directed.  Advised if symptoms worsen and/or unresolved please follow-up with your ophthalmologist/optometry for further evaluation.     ED Prescriptions     Medication Sig Dispense Auth. Provider   moxifloxacin  (VIGAMOX ) 0.5 % ophthalmic solution Place 1 drop into both eyes 3 (three) times daily for 7 days. 3 mL Kayla Cure, FNP      PDMP not reviewed this encounter.    [1]  Social History Tobacco Use   Smoking status: Never   Smokeless tobacco: Never   Vaping Use   Vaping status: Never Used  Substance Use Topics   Alcohol use: Never   Drug use: Never     Teddy Sharper, FNP 11/14/24 [phone]  "

## 2024-11-15 ENCOUNTER — Other Ambulatory Visit: Payer: Self-pay | Admitting: Pharmacist Clinician (PhC)/ Clinical Pharmacy Specialist

## 2024-11-15 MED ORDER — REPATHA SURECLICK 140 MG/ML ~~LOC~~ SOAJ: 140.0000 mg | SUBCUTANEOUS | 3 refills | Status: AC

## 2024-11-21 LAB — HM MAMMOGRAPHY

## 2024-11-24 ENCOUNTER — Encounter: Payer: Self-pay | Admitting: Cardiology

## 2024-12-01 ENCOUNTER — Ambulatory Visit (INDEPENDENT_AMBULATORY_CARE_PROVIDER_SITE_OTHER)

## 2024-12-01 VITALS — BP 139/62 | HR 80 | Ht 62.0 in | Wt 202.0 lb

## 2024-12-01 DIAGNOSIS — Z0000 Encounter for general adult medical examination without abnormal findings: Secondary | ICD-10-CM

## 2024-12-01 NOTE — Progress Notes (Signed)
 "  Chief Complaint  Patient presents with   Medicare Wellness     Subjective:   Kayla Bennett is a 74 y.o. female who presents for a Medicare Annual Wellness Visit.  Visit info / Clinical Intake: Medicare Wellness Visit Type:: Subsequent Annual Wellness Visit Persons participating in visit and providing information:: patient Medicare Wellness Visit Mode:: In-person (required for WTM) Interpreter Needed?: No Pre-visit prep was completed: yes AWV questionnaire completed by patient prior to visit?: yes Date:: 11/27/24 Living arrangements:: lives with spouse/significant other Patient's Overall Health Status Rating: (!) fair Typical amount of pain: some Does pain affect daily life?: (!) yes Are you currently prescribed opioids?: no  Dietary Habits and Nutritional Risks How many meals a day?: 3 Eats fruit and vegetables daily?: yes Most meals are obtained by: preparing own meals Diabetic:: (!) yes Any non-healing wounds?: no How often do you check your BS?: as needed Would you like to be referred to a Nutritionist or for Diabetic Management? : no  Functional Status Activities of Daily Living (to include ambulation/medication): Independent Ambulation: Independent with device- listed below Home Assistive Devices/Equipment: Walker (specify Type); Cane Medication Administration: Independent Home Management (perform basic housework or laundry): Needs assistance (comment) Manage your own finances?: (!) no Primary transportation is: family / friends Concerns about vision?: no *vision screening is required for WTM* Concerns about hearing?: no  Fall Screening Falls in the past year?: 1 Number of falls in past year: 0 Was there an injury with Fall?: 1 Fall Risk Category Calculator: 2 Patient Fall Risk Level: Moderate Fall Risk  Fall Risk Patient at Risk for Falls Due to: Impaired balance/gait Fall risk Follow up: Falls evaluation completed  Home and Transportation  Safety: All rugs have non-skid backing?: yes All stairs or steps have railings?: N/A, no stairs Grab bars in the bathtub or shower?: yes Have non-skid surface in bathtub or shower?: (!) no Good home lighting?: yes Regular seat belt use?: yes Hospital stays in the last year:: (!) yes How many hospital stays:: 1 Reason: Fall  Cognitive Assessment Difficulty concentrating, remembering, or making decisions? : no Will 6CIT or Mini Cog be Completed: yes What year is it?: 0 points What month is it?: 0 points Give patient an address phrase to remember (5 components): [address] [place], [place] About what time is it?: 0 points Count backwards from 20 to 1: 0 points Say the months of the year in reverse: 0 points Repeat the address phrase from earlier: 0 points 6 CIT Score: 0 points  Advance Directives (For Healthcare) Does Patient Have a Medical Advance Directive?: Yes Does patient want to make changes to medical advance directive?: No - Guardian declined Type of Advance Directive: Healthcare Power of [place]; Living will Copy of Living Will in Chart?: No - copy requested Living Will Requested and Now in Chart: Copy in chart Would patient like information on creating a medical advance directive?: No - Patient declined  Reviewed/Updated  Reviewed/Updated: Medical History; Family History; Medications; Allergies; Care Teams; Patient Goals    Allergies (verified) Lotensin [benazepril hcl], Morphine, Erythromycin base, Hydrocodone , Other, Banana, Chocolate, Gluten meal, Tizanidine, and Wound dressing adhesive   Current Medications (verified) Outpatient Encounter Medications as of 12/01/2024  Medication Sig   acetaminophen  (TYLENOL ) 500 MG tablet Take 2 tablets (1,000 mg total) by mouth every 8 (eight) hours.   Calcium  Carbonate (CALCIUM  600 PO) Take 600 mg by mouth in the morning.   cetirizine (ZYRTEC) 10 MG tablet Take 10 mg by  mouth in the morning.   cholecalciferol  (VITAMIN D3)  25 MCG (1000 UNIT) tablet Take 1,000 Units by mouth in the morning and at bedtime.   ELIQUIS  5 MG TABS tablet TAKE ONE TABLET BY MOUTH TWICE DAILY   ferrous sulfate  325 (65 FE) MG tablet Take 325 mg by mouth every evening.   furosemide  (LASIX ) 40 MG tablet Take 1 tablet (40 mg total) by mouth daily.   gabapentin  (NEURONTIN ) 300 MG capsule Take 1 capsule (300 mg total) by mouth 3 (three) times daily. (Patient taking differently: Take 300 mg by mouth at bedtime.)   hydrocortisone 2.5 % cream Apply 1 Application topically 2 (two) times daily.   ibuprofen  (ADVIL ) 200 MG tablet Take 200 mg by mouth every 6 (six) hours as needed.   losartan  (COZAAR ) 100 MG tablet Take 1 tablet (100 mg total) by mouth daily.   metFORMIN (GLUCOPHAGE-XR) 500 MG 24 hr tablet TAKE ONE TABLET BY MOUTH EVERY DAY   metoprolol  succinate (TOPROL -XL) 100 MG 24 hr tablet Take 1 tablet (100 mg total) by mouth daily. Take with or immediately following a meal.   pantoprazole  (PROTONIX ) 40 MG tablet TAKE ONE TABLET BY MOUTH TWICE DAILY   potassium chloride  SA (KLOR-CON  M) 20 MEQ tablet Take 1 tablet (20 mEq total) by mouth daily.   Prenatal Vit-Fe Fumarate-FA (PRENATAL VITAMIN PLUS LOW IRON PO) Take 1 tablet by mouth in the morning.   tamoxifen  (NOLVADEX ) 20 MG tablet Take 1 tablet (20 mg total) by mouth every morning.   triamcinolone (KENALOG) 0.1 % Apply 1 application  topically 2 (two) times daily as needed (psoriasis). Mixed with Cetaphil 1:1   UNABLE TO FIND OUTPATIENT PHYSICAL THERAPY    Diagnosis: Generalized debility due to UTI, acute gastroenteritis   venlafaxine  XR (EFFEXOR -XR) 37.5 MG 24 hr capsule Take 1 capsule (37.5 mg total) by mouth at bedtime.   venlafaxine  XR (EFFEXOR -XR) 75 MG 24 hr capsule TAKE ONE CAPSULE BY MOUTH EVERY DAY   vitamin C (ASCORBIC ACID) 500 MG tablet Take 500 mg by mouth every evening.   albuterol  (VENTOLIN  HFA) 108 (90 Base) MCG/ACT inhaler Inhale 2 puffs into the lungs every 6 (six) hours as  needed for wheezing or shortness of breath. (Patient not taking: Reported on 12/01/2024)   diclofenac  Sodium (VOLTAREN ) 1 % GEL Apply 2 g topically 4 (four) times daily. (Patient not taking: Reported on 12/01/2024)   Evolocumab  (REPATHA  SURECLICK) 140 MG/ML SOAJ Inject 140 mg into the skin every 14 (fourteen) days. (Patient not taking: Reported on 12/01/2024)   nystatin cream (MYCOSTATIN) Apply 1 Application topically as needed for dry skin. (Patient not taking: Reported on 12/01/2024)   polyethylene glycol (MIRALAX  / GLYCOLAX ) 17 g packet Take 17 g by mouth daily. (Patient not taking: Reported on 12/01/2024)   [DISCONTINUED] bisacodyl  (DULCOLAX) 10 MG suppository Place 1 suppository (10 mg total) rectally daily as needed for moderate constipation.   [DISCONTINUED] cyclobenzaprine  (FLEXERIL ) 10 MG tablet Take 1 tablet (10 mg total) by mouth 3 (three) times daily.   [DISCONTINUED] lidocaine  (LIDODERM ) 5 % Place 1 patch onto the skin daily. Remove & Discard patch within 12 hours or as directed by MD   [DISCONTINUED] simethicone  (MYLICON) 80 MG chewable tablet Chew 1 tablet (80 mg total) by mouth every 6 (six) hours as needed for flatulence.   No facility-administered encounter medications on file as of 12/01/2024.    History: Past Medical History:  Diagnosis Date   Allergy 1978   Food allergies, seasonal allergies,  medicine allergies   Anemia    Arthritis    Asthma    Blood transfusion without reported diagnosis 1985   Breast cancer (HCC)    Diabetes (HCC)    Dyspnea    Dysrhythmia    afibb on occasion   Family history of adverse reaction to anesthesia    mother had reaction to versed ...during surgery, stopped breathing   Fibromyalgia    GERD (gastroesophageal reflux disease)    Headache    History of hiatal hernia    Hyperlipidemia 1990?   Hypertension    Migraines    Osteopenia    Osteoporosis    Oxygen  deficiency 2023   Psoriasis    Sleep apnea 2023   Yeast infection 10/30/2021    Past Surgical History:  Procedure Laterality Date   ABDOMINAL HYSTERECTOMY     APPENDECTOMY     ATRIAL FIBRILLATION ABLATION N/A 12/10/2021   Procedure: ATRIAL FIBRILLATION ABLATION;  Surgeon: Inocencio Soyla Lunger, MD;  Location: [facility];  Service: Cardiovascular;  Laterality: N/A;   BREAST LUMPECTOMY WITH RADIOACTIVE SEED AND SENTINEL LYMPH NODE BIOPSY Right 02/18/2021   Procedure: RIGHT BREAST LUMPECTOMY WITH RADIOACTIVE SEED AND SENTINEL LYMPH NODE BIOPSY;  Surgeon: Aron Shoulders, MD;  Location: [facility] OR;  Service: General;  Laterality: Right;   BREAST SURGERY  2022   broken wrist     CARDIOVERSION N/A 04/04/2021   Procedure: CARDIOVERSION;  Surgeon: Shlomo Wilbert SAUNDERS, MD;  Location: [facility] ENDOS[facility];  Service: Cardiovascular;  Laterality: N/A;   CARDIOVERSION N/A 08/14/2021   Procedure: CARDIOVERSION;  Surgeon: Barbaraann Darryle Ned, MD;  Location: [name] ENDOSCOPY;  Service: Cardiovascular;  Laterality: N/A;   CARDIOVERSION N/A 09/19/2021   Procedure: CARDIOVERSION;  Surgeon: Barbaraann Darryle Ned, MD;  Location: [facility] ENDOSCOPY;  Service: Cardiovascular;  Laterality: N/A;   CESAREAN SECTION  1976 and 1977   CHOLECYSTECTOMY     FRACTURE SURGERY     right wrist   HYSTERECTOMY ABDOMINAL WITH SALPINGO-OOPHORECTOMY     LYSIS OF ADHESION     RIGHT/LEFT HEART CATH AND CORONARY ANGIOGRAPHY N/A 02/18/2024   Procedure: RIGHT/LEFT HEART CATH AND CORONARY ANGIOGRAPHY;  Surgeon: Verlin Lonni BIRCH, MD;  Location: [facility];  Service: Cardiovascular;  Laterality: N/A;   SHOULDER SURGERY Left    TUBAL LIGATION     Family History  Problem Relation Age of Onset   Atrial fibrillation Mother    Heart attack Mother 71   Arthritis Mother    Cancer Mother    Diabetes Mother    Heart disease Mother    Hypertension Mother    Colon cancer Father 22   Heart attack Father 40   Cancer Father    Early death Father    Heart disease Father    Hypertension Father    Heart attack Paternal  Grandmother 88   Cancer Maternal Uncle        melanoma    Colon cancer Paternal Uncle    Cancer Paternal Uncle    Diabetes Paternal Uncle    Heart disease Paternal Uncle    Obesity Paternal Uncle    Colon cancer Cousin    Atrial fibrillation Cousin    Stroke Maternal Grandmother    Stroke Paternal Grandfather    Arthritis Maternal Aunt    Diabetes Maternal Aunt    Hearing loss Maternal Aunt    Cancer Paternal Uncle    Heart disease Paternal Uncle    Obesity Paternal Uncle    Diabetes Maternal Aunt  Diabetes Maternal Aunt    Heart disease Maternal Aunt    Diabetes Maternal Uncle    Hearing loss Maternal Uncle    Diabetes Paternal Aunt    Hearing loss Paternal Aunt    Diabetes Sister    Hyperlipidemia Sister    Hypertension Sister    Diabetes Paternal Uncle    Hearing loss Paternal Uncle    Diabetes Paternal Uncle    Hearing loss Paternal Uncle    Heart disease Paternal Uncle    Hypertension Paternal Uncle    Diabetes Paternal Uncle    Stroke Maternal Uncle    Social History   Occupational History   Not on file  Tobacco Use   Smoking status: Never   Smokeless tobacco: Never  Vaping Use   Vaping status: Never Used  Substance and Sexual Activity   Alcohol use: Never   Drug use: Never   Sexual activity: Not Currently   Tobacco Counseling Counseling given: Not Answered  SDOH Screenings   Food Insecurity: No Food Insecurity (12/01/2024)  Housing: Low Risk (12/01/2024)  Transportation Needs: No Transportation Needs (12/01/2024)  Utilities: Not At Risk (12/01/2024)  Alcohol Screen: Low Risk (12/01/2023)  Depression (PHQ2-9): Low Risk (12/01/2024)  Financial Resource Strain: Low Risk (10/02/2024)  Physical Activity: Insufficiently Active (12/01/2024)  Social Connections: Moderately Isolated (12/01/2024)  Stress: No Stress Concern Present (12/01/2024)  Tobacco Use: Low Risk (12/01/2024)  Health Literacy: Adequate Health Literacy (12/01/2023)   See flowsheets for full  screening details  Depression Screen PHQ 2 & 9 Depression Scale- Over the past 2 weeks, how often have you been bothered by any of the following problems? Little interest or pleasure in doing things: 0 Feeling down, depressed, or hopeless (PHQ Adolescent also includes...irritable): 0 PHQ-2 Total Score: 0     Goals Addressed             This Visit's Progress    Patient Stated       Patient states he would like to get stronger and better balance.              Objective:    Today's Vitals   12/01/24 0901 12/01/24 0936  BP: (!) 152/54 139/62  Pulse: 80   SpO2: 94%   Weight: 202 lb (91.6 kg)   Height: 5' 2 (1.575 m)    Body mass index is 36.95 kg/m.  Hearing/Vision screen No results found. Immunizations and Health Maintenance Health Maintenance  Topic Date Due   Zoster Vaccines- Shingrix (1 of 2) Never done   OPHTHALMOLOGY EXAM  06/06/2023   COVID-19 Vaccine (3 - 2025-26 season) 07/18/2024   Influenza Vaccine  02/14/2025 (Originally 06/17/2024)   Hepatitis C Screening  04/07/2025 (Originally 02/17/1969)   DTaP/Tdap/Td (2 - Td or Tdap) 08/08/2025 (Originally 05/31/2024)   HEMOGLOBIN A1C  12/31/2024   Diabetic kidney evaluation - Urine ACR  04/07/2025   FOOT EXAM  08/08/2025   Diabetic kidney evaluation - eGFR measurement  09/28/2025   Mammogram  11/21/2025   Medicare Annual Wellness (AWV)  12/01/2025   Colonoscopy  04/19/2029   Pneumococcal Vaccine: 50+ Years  Completed   Bone Density Scan  Completed   Meningococcal B Vaccine  Aged Out        Assessment/Plan:  This is a routine wellness examination for Ellanie.  Patient Care Team: Alvia Bring, DO as PCP - General (Family Medicine) Inocencio Soyla Lunger, MD as PCP - Electrophysiology (Cardiology) Shlomo Wilbert SAUNDERS, MD as PCP - Sleep Medicine (Cardiology) Tobb, Kardie,  DO as PCP - Cardiology (Cardiology) Lanny Callander, MD as Consulting Physician (Hematology)  I have personally reviewed and noted the following  in the patients chart:   Medical and social history Use of alcohol, tobacco or illicit drugs  Current medications and supplements including opioid prescriptions. Functional ability and status Nutritional status Physical activity Advanced directives List of other physicians Hospitalizations, surgeries, and ER visits in previous 12 months Vitals Screenings to include cognitive, depression, and falls Referrals and appointments  No orders of the defined types were placed in this encounter.  In addition, I have reviewed and discussed with patient certain preventive protocols, quality metrics, and best practice recommendations. A written personalized care plan for preventive services as well as general preventive health recommendations were provided to patient.   Bonny Jon Mayor, CMA   12/01/2024   Return in 1 year (on 12/01/2025).  After Visit Summary: (In Person-Declined) Patient declined AVS at this time.  Nurse Notes:   ZAHAVA QUANT is a 74 y.o. female patient of Velma Ku who had a Medicare Annual Wellness Visit today. She reports that she is socially active and does interact with friends/family regularly. She is moderately physically active.     "

## 2024-12-01 NOTE — Patient Instructions (Addendum)
 Kayla Bennett,  Thank you for taking the time for your Medicare Wellness Visit. I appreciate your continued commitment to your health goals. Please review the care plan we discussed, and feel free to reach out if I can assist you further.  Please note that Annual Wellness Visits do not include a physical exam. Some assessments may be limited, especially if the visit was conducted virtually. If needed, we may recommend an in-person follow-up with your provider.  Ongoing Care Seeing your primary care provider every 3 to 6 months helps us  monitor your health and provide consistent, personalized care.   Referrals If a referral was made during today's visit and you haven't received any updates within two weeks, please contact the referred provider directly to check on the status.  Recommended Screenings:  Health Maintenance  Topic Date Due   Zoster (Shingles) Vaccine (1 of 2) Never done   Eye exam for diabetics  06/06/2023   COVID-19 Vaccine (3 - 2025-26 season) 07/18/2024   Flu Shot  02/14/2025*   Hepatitis C Screening  04/07/2025*   DTaP/Tdap/Td vaccine (2 - Td or Tdap) 08/08/2025*   Hemoglobin A1C  12/31/2024   Yearly kidney health urinalysis for diabetes  04/07/2025   Complete foot exam   08/08/2025   Yearly kidney function blood test for diabetes  09/28/2025   Breast Cancer Screening  11/21/2025   Medicare Annual Wellness Visit  12/01/2025   Colon Cancer Screening  04/19/2029   Pneumococcal Vaccine for age over 33  Completed   Osteoporosis screening with Bone Density Scan  Completed   Meningitis B Vaccine  Aged Out  *Topic was postponed. The date shown is not the original due date.       12/01/2024    9:20 AM  Advanced Directives  Does Patient Have a Medical Advance Directive? Yes  Type of Estate Agent of [place];Living will  Does patient want to make changes to medical advance directive? No - Guardian declined    Vision: Annual vision screenings are  recommended for early detection of glaucoma, cataracts, and diabetic retinopathy. These exams can also reveal signs of chronic conditions such as diabetes and high blood pressure.  Dental: Annual dental screenings help detect early signs of oral cancer, gum disease, and other conditions linked to overall health, including heart disease and diabetes.  Please see the attached documents for additional preventive care recommendations.

## 2024-12-02 LAB — HM MAMMOGRAPHY

## 2024-12-03 ENCOUNTER — Other Ambulatory Visit: Payer: Self-pay | Admitting: Nurse Practitioner

## 2024-12-03 DIAGNOSIS — Z170 Estrogen receptor positive status [ER+]: Secondary | ICD-10-CM

## 2024-12-05 ENCOUNTER — Encounter: Payer: Self-pay | Admitting: Family Medicine

## 2024-12-11 ENCOUNTER — Other Ambulatory Visit: Payer: Self-pay | Admitting: Family Medicine

## 2024-12-14 ENCOUNTER — Encounter: Payer: Self-pay | Admitting: Family Medicine

## 2024-12-14 ENCOUNTER — Ambulatory Visit: Admitting: Family Medicine

## 2024-12-14 VITALS — BP 120/66 | HR 73 | Ht 62.0 in | Wt 204.0 lb

## 2024-12-14 DIAGNOSIS — Z7984 Long term (current) use of oral hypoglycemic drugs: Secondary | ICD-10-CM

## 2024-12-14 DIAGNOSIS — R2689 Other abnormalities of gait and mobility: Secondary | ICD-10-CM | POA: Diagnosis not present

## 2024-12-14 DIAGNOSIS — R5383 Other fatigue: Secondary | ICD-10-CM | POA: Diagnosis not present

## 2024-12-14 DIAGNOSIS — E119 Type 2 diabetes mellitus without complications: Secondary | ICD-10-CM

## 2024-12-14 DIAGNOSIS — R4189 Other symptoms and signs involving cognitive functions and awareness: Secondary | ICD-10-CM | POA: Diagnosis not present

## 2024-12-14 DIAGNOSIS — Z23 Encounter for immunization: Secondary | ICD-10-CM | POA: Diagnosis not present

## 2024-12-14 DIAGNOSIS — E669 Obesity, unspecified: Secondary | ICD-10-CM | POA: Diagnosis not present

## 2024-12-14 DIAGNOSIS — I10 Essential (primary) hypertension: Secondary | ICD-10-CM | POA: Diagnosis not present

## 2024-12-14 NOTE — Assessment & Plan Note (Signed)
 Diabetes remains very well-controlled.  She will continue on metformin.  Updating A1c today.

## 2024-12-14 NOTE — Progress Notes (Signed)
 " Kayla Bennett - 74 y.o. female MRN [id number]  Date of birth: 03-03-1951  Subjective Chief Complaint  Patient presents with   Fatigue   brain fog    HPI Kayla Bennett is a 74 y.o. female here today with concern of fatigue.  She is sleeping longer and taking more naps during the day.  She has had a habit of sleeping longer durations throughout most of her life that seems worse than typical.  She is using BiPAP with oxygen  regularly.  Her balance does seem to be a little off as well.  She feels like she is sleeping well at night.  Notes some changes in memory as well.  Husband has noted that she is more forgetful at times and is concerned about possibility of dementia.  ROS:  A comprehensive ROS was completed and negative except as noted per HPI   Allergies[1]  Past Medical History:  Diagnosis Date   Allergy 1978   Food allergies, seasonal allergies, medicine allergies   Anemia    Arthritis    Asthma    Blood transfusion without reported diagnosis 1985   Breast cancer (HCC)    Diabetes (HCC)    Dyspnea    Dysrhythmia    afibb on occasion   Family history of adverse reaction to anesthesia    mother had reaction to versed ...during surgery, stopped breathing   Fibromyalgia    GERD (gastroesophageal reflux disease)    Headache    History of hiatal hernia    Hyperlipidemia 1990?   Hypertension    Migraines    Osteopenia    Osteoporosis    Oxygen  deficiency 2023   Psoriasis    Sleep apnea 2023   Yeast infection 10/30/2021    Past Surgical History:  Procedure Laterality Date   ABDOMINAL HYSTERECTOMY     APPENDECTOMY     ATRIAL FIBRILLATION ABLATION N/A 12/10/2021   Procedure: ATRIAL FIBRILLATION ABLATION;  Surgeon: Inocencio Soyla Lunger, MD;  Location: [facility];  Service: Cardiovascular;  Laterality: N/A;   BREAST LUMPECTOMY WITH RADIOACTIVE SEED AND SENTINEL LYMPH NODE BIOPSY Right 02/18/2021   Procedure: RIGHT BREAST LUMPECTOMY WITH RADIOACTIVE SEED AND  SENTINEL LYMPH NODE BIOPSY;  Surgeon: Aron Shoulders, MD;  Location: [facility] OR;  Service: General;  Laterality: Right;   BREAST SURGERY  2022   broken wrist     CARDIOVERSION N/A 04/04/2021   Procedure: CARDIOVERSION;  Surgeon: Shlomo Wilbert SAUNDERS, MD;  Location: [facility];  Service: Cardiovascular;  Laterality: N/A;   CARDIOVERSION N/A 08/14/2021   Procedure: CARDIOVERSION;  Surgeon: Barbaraann Darryle Ned, MD;  Location: [facility];  Service: Cardiovascular;  Laterality: N/A;   CARDIOVERSION N/A 09/19/2021   Procedure: CARDIOVERSION;  Surgeon: Barbaraann Darryle Ned, MD;  Location: [facility] ENDOSCOPY;  Service: Cardiovascular;  Laterality: N/A;   CESAREAN SECTION  1976 and 1977   CHOLECYSTECTOMY     FRACTURE SURGERY     right wrist   HYSTERECTOMY ABDOMINAL WITH SALPINGO-OOPHORECTOMY     LYSIS OF ADHESION     RIGHT/LEFT HEART CATH AND CORONARY ANGIOGRAPHY N/A 02/18/2024   Procedure: RIGHT/LEFT HEART CATH AND CORONARY ANGIOGRAPHY;  Surgeon: Verlin Lonni BIRCH, MD;  Location: [facility];  Service: Cardiovascular;  Laterality: N/A;   SHOULDER SURGERY Left    TUBAL LIGATION      Social History   Socioeconomic History   Marital status: Married    Spouse name: Not on file   Number of children: 2   Years of  education: Not on file   Highest education level: 12th grade  Occupational History   Not on file  Tobacco Use   Smoking status: Never   Smokeless tobacco: Never  Vaping Use   Vaping status: Never Used  Substance and Sexual Activity   Alcohol use: Never   Drug use: Never   Sexual activity: Not Currently  Other Topics Concern   Not on file  Social History Narrative   Kayla Bennett lives with her husband. Works part-time.    Social Drivers of Health   Tobacco Use: Low Risk (12/14/2024)   Patient History    Smoking Tobacco Use: Never    Smokeless Tobacco Use: Never    Passive Exposure: Not on file  Financial Resource Strain: Low Risk (10/02/2024)   Overall Financial Resource  Strain (CARDIA)    Difficulty of Paying Living Expenses: Not hard at all  Food Insecurity: No Food Insecurity (12/01/2024)   Epic    Worried About Programme Researcher, Broadcasting/film/video in the Last Year: Never true    Ran Out of Food in the Last Year: Never true  Transportation Needs: No Transportation Needs (12/01/2024)   Epic    Lack of Transportation (Medical): No    Lack of Transportation (Non-Medical): No  Physical Activity: Insufficiently Active (12/01/2024)   Exercise Vital Sign    Days of Exercise per Week: 7 days    Minutes of Exercise per Session: 20 min  Stress: No Stress Concern Present (12/01/2024)   [employer] of Occupational Health - Occupational Stress Questionnaire    Feeling of Stress: Not at all  Social Connections: Moderately Isolated (12/01/2024)   Social Connection and Isolation Panel    Frequency of Communication with Friends and Family: Three times a week    Frequency of Social Gatherings with Friends and Family: Once a week    Attends Religious Services: Never    Database Administrator or Organizations: No    Attends Engineer, Structural: Not on file    Marital Status: Married  Depression (PHQ2-9): Low Risk (12/01/2024)   Depression (PHQ2-9)    PHQ-2 Score: 0  Alcohol Screen: Low Risk (12/01/2023)   Alcohol Screen    Last Alcohol Screening Score (AUDIT): 0  Housing: Low Risk (12/01/2024)   Epic    Unable to Pay for Housing in the Last Year: No    Number of Times Moved in the Last Year: 0    Homeless in the Last Year: No  Utilities: Not At Risk (12/01/2024)   Epic    Threatened with loss of utilities: No  Health Literacy: Adequate Health Literacy (12/01/2023)   B1300 Health Literacy    Frequency of need for help with medical instructions: Never    Family History  Problem Relation Age of Onset   Atrial fibrillation Mother    Heart attack Mother 69   Arthritis Mother    Cancer Mother    Diabetes Mother    Heart disease Mother    Hypertension Mother     Colon cancer Father 50   Heart attack Father 62   Cancer Father    Early death Father    Heart disease Father    Hypertension Father    Heart attack Paternal Grandmother 55   Cancer Maternal Uncle        melanoma    Colon cancer Paternal Uncle    Cancer Paternal Uncle    Diabetes Paternal Uncle    Heart disease Paternal Uncle  Obesity Paternal Uncle    Colon cancer Cousin    Atrial fibrillation Cousin    Stroke Maternal Grandmother    Stroke Paternal Grandfather    Arthritis Maternal Aunt    Diabetes Maternal Aunt    Hearing loss Maternal Aunt    Cancer Paternal Uncle    Heart disease Paternal Uncle    Obesity Paternal Uncle    Diabetes Maternal Aunt    Diabetes Maternal Aunt    Heart disease Maternal Aunt    Diabetes Maternal Uncle    Hearing loss Maternal Uncle    Diabetes Paternal Aunt    Hearing loss Paternal Aunt    Diabetes Sister    Hyperlipidemia Sister    Hypertension Sister    Diabetes Paternal Uncle    Hearing loss Paternal Uncle    Diabetes Paternal Uncle    Hearing loss Paternal Uncle    Heart disease Paternal Uncle    Hypertension Paternal Uncle    Diabetes Paternal Uncle    Stroke Maternal Uncle     Health Maintenance  Topic Date Due   OPHTHALMOLOGY EXAM  06/06/2023   COVID-19 Vaccine (3 - 2025-26 season) 12/30/2024 (Originally 07/18/2024)   Zoster Vaccines- Shingrix (1 of 2) 03/14/2025 (Originally 02/17/1970)   Hepatitis C Screening  04/07/2025 (Originally 02/17/1969)   DTaP/Tdap/Td (2 - Td or Tdap) 08/08/2025 (Originally 05/31/2024)   HEMOGLOBIN A1C  12/31/2024   Diabetic kidney evaluation - Urine ACR  04/07/2025   FOOT EXAM  08/08/2025   Diabetic kidney evaluation - eGFR measurement  09/28/2025   Medicare Annual Wellness (AWV)  12/01/2025   Mammogram  12/02/2025   Colonoscopy  04/19/2029   Pneumococcal Vaccine: 50+ Years  Completed   Influenza Vaccine  Completed   Bone Density Scan  Completed   Meningococcal B Vaccine  Aged Out      ----------------------------------------------------------------------------------------------------------------------------------------------------------------------------------------------------------------- Physical Exam BP 120/66   Pulse 73   Ht 5' 2 (1.575 m)   Wt 204 lb (92.5 kg)   SpO2 96%   BMI 37.31 kg/m   Physical Exam Constitutional:      Appearance: Normal appearance.  Eyes:     General: No scleral icterus. Cardiovascular:     Rate and Rhythm: Normal rate and regular rhythm.  Pulmonary:     Effort: Pulmonary effort is normal.     Breath sounds: Normal breath sounds.  Neurological:     General: No focal deficit present.     Mental Status: She is alert and oriented to person, place, and time.     Comments: Unsteady gait. No tremor noted.   Psychiatric:        Mood and Affect: Mood normal.        Behavior: Behavior normal.     ------------------------------------------------------------------------------------------------------------------------------------------------------------------------------------------------------------------- Assessment and Plan  Balance problem Still with some balance issues.  Referral to physical therapy.  Hypertension BP is well controlled.  Continue losartan  and metoprolol  at current strength.   Type 2 diabetes mellitus in patient with obesity (HCC) Diabetes remains very well-controlled.  She will continue on metformin.  Updating A1c today.  Decreased energy She is compliant with CPAP therapy.  Followed by hematology and ferritin and B12 levels were normal recently. Checking additional labs including TSH.  Cognitive changes Checking RPR and TSH.  MRI brain ordered.   No orders of the defined types were placed in this encounter.   No follow-ups on file.         [1]  Allergies Allergen Reactions   Lotensin [  Benazepril Hcl] Swelling   Morphine Itching and Rash   Erythromycin Base Other (See Comments)     Unknown reaction   Hydrocodone  Other (See Comments)    Unknown reaction     Other Swelling    Mustard - vaginal swelling/pain    Banana Swelling    vaginal swelling/pain   Chocolate Swelling    vaginal swelling/pain   Gluten Meal Diarrhea and Other (See Comments)    Upset stomach    Tizanidine Other (See Comments)    Lowered blood pressure    Wound Dressing Adhesive Other (See Comments)    Skin burns, blisters    "

## 2024-12-14 NOTE — Assessment & Plan Note (Signed)
 Checking RPR and TSH.  MRI brain ordered.

## 2024-12-14 NOTE — Assessment & Plan Note (Signed)
 She is compliant with CPAP therapy.  Followed by hematology and ferritin and B12 levels were normal recently. Checking additional labs including TSH.

## 2024-12-14 NOTE — Assessment & Plan Note (Signed)
 Still with some balance issues.  Referral to physical therapy.

## 2024-12-14 NOTE — Assessment & Plan Note (Signed)
 BP is well controlled.  Continue losartan  and metoprolol  at current strength.

## 2024-12-15 LAB — TSH: TSH: 4.47 u[IU]/mL (ref 0.450–4.500)

## 2024-12-15 LAB — SYPHILIS: RPR W/REFLEX TO RPR TITER AND TREPONEMAL ANTIBODIES, TRADITIONAL SCREENING AND DIAGNOSIS ALGORITHM: RPR Ser Ql: NONREACTIVE

## 2024-12-15 LAB — HEMOGLOBIN A1C
Est. average glucose Bld gHb Est-mCnc: 146 mg/dL
Hgb A1c MFr Bld: 6.7 % — ABNORMAL HIGH (ref 4.8–5.6)

## 2024-12-18 ENCOUNTER — Other Ambulatory Visit: Payer: Self-pay | Admitting: Cardiology

## 2024-12-18 DIAGNOSIS — I480 Paroxysmal atrial fibrillation: Secondary | ICD-10-CM

## 2024-12-19 NOTE — Telephone Encounter (Signed)
 Eliquis  5mg  refill request received. Patient is 74 years old, weight-92.5kg, Crea-0.71 on 09/28/24, Diagnosis-Afib, and last seen by Orren Fabry on 06/24/24 tele visit & Dr. Sheena on 03/03/24. Dose is appropriate based on dosing criteria. Will send in refill to requested pharmacy.

## 2024-12-23 ENCOUNTER — Ambulatory Visit: Payer: Self-pay | Admitting: Family Medicine

## 2025-03-28 ENCOUNTER — Inpatient Hospital Stay

## 2025-03-28 ENCOUNTER — Inpatient Hospital Stay: Admitting: Nurse Practitioner

## 2025-12-05 ENCOUNTER — Ambulatory Visit
